# Patient Record
Sex: Male | Born: 1935 | Race: White | Hispanic: No | Marital: Married | Smoking: Former smoker
Health system: Southern US, Community
[De-identification: ages and names within clinical notes are randomized; demographics above are authoritative.]

## PROBLEM LIST (undated history)

## (undated) DIAGNOSIS — N200 Calculus of kidney: Secondary | ICD-10-CM

## (undated) DIAGNOSIS — C4359 Malignant melanoma of other part of trunk: Secondary | ICD-10-CM

## (undated) DIAGNOSIS — C649 Malignant neoplasm of unspecified kidney, except renal pelvis: Secondary | ICD-10-CM

## (undated) DIAGNOSIS — I2510 Atherosclerotic heart disease of native coronary artery without angina pectoris: Secondary | ICD-10-CM

## (undated) DIAGNOSIS — I219 Acute myocardial infarction, unspecified: Secondary | ICD-10-CM

## (undated) DIAGNOSIS — F17200 Nicotine dependence, unspecified, uncomplicated: Secondary | ICD-10-CM

## (undated) DIAGNOSIS — Z860100 Personal history of colon polyps, unspecified: Secondary | ICD-10-CM

## (undated) DIAGNOSIS — Z9861 Coronary angioplasty status: Secondary | ICD-10-CM

## (undated) DIAGNOSIS — I200 Unstable angina: Secondary | ICD-10-CM

## (undated) DIAGNOSIS — I10 Essential (primary) hypertension: Secondary | ICD-10-CM

## (undated) DIAGNOSIS — R0600 Dyspnea, unspecified: Secondary | ICD-10-CM

## (undated) DIAGNOSIS — N400 Enlarged prostate without lower urinary tract symptoms: Secondary | ICD-10-CM

## (undated) DIAGNOSIS — N529 Male erectile dysfunction, unspecified: Secondary | ICD-10-CM

## (undated) DIAGNOSIS — R7989 Other specified abnormal findings of blood chemistry: Secondary | ICD-10-CM

## (undated) DIAGNOSIS — K648 Other hemorrhoids: Secondary | ICD-10-CM

## (undated) DIAGNOSIS — R748 Abnormal levels of other serum enzymes: Secondary | ICD-10-CM

## (undated) DIAGNOSIS — I25110 Atherosclerotic heart disease of native coronary artery with unstable angina pectoris: Secondary | ICD-10-CM

## (undated) DIAGNOSIS — I252 Old myocardial infarction: Secondary | ICD-10-CM

## (undated) DIAGNOSIS — C439 Malignant melanoma of skin, unspecified: Secondary | ICD-10-CM

## (undated) DIAGNOSIS — Q619 Cystic kidney disease, unspecified: Secondary | ICD-10-CM

## (undated) DIAGNOSIS — E7800 Pure hypercholesterolemia, unspecified: Secondary | ICD-10-CM

## (undated) HISTORY — DX: Abnormal levels of other serum enzymes: R74.8

## (undated) HISTORY — PX: CORONARY ANGIOPLASTY: SHX604

## (undated) HISTORY — DX: Personal history of colon polyps, unspecified: Z86.0100

## (undated) HISTORY — DX: Other hemorrhoids: K64.8

## (undated) HISTORY — DX: Personal history of colonic polyps: Z86.010

## (undated) HISTORY — DX: Other specified abnormal findings of blood chemistry: R79.89

## (undated) HISTORY — DX: Malignant melanoma of skin, unspecified: C43.9

## (undated) HISTORY — DX: Male erectile dysfunction, unspecified: N52.9

## (undated) HISTORY — PX: CORONARY ANGIOPLASTY WITH STENT PLACEMENT: SHX49

## (undated) HISTORY — DX: Pure hypercholesterolemia, unspecified: E78.00

## (undated) HISTORY — DX: Benign prostatic hyperplasia without lower urinary tract symptoms: N40.0

## (undated) HISTORY — DX: Malignant neoplasm of unspecified kidney, except renal pelvis: C64.9

## (undated) HISTORY — DX: Cystic kidney disease, unspecified: Q61.9

## (undated) HISTORY — DX: Malignant melanoma of other part of trunk: C43.59

## (undated) HISTORY — PX: MELANOMA EXCISION: SHX5266

## (undated) HISTORY — DX: Nicotine dependence, unspecified, uncomplicated: F17.200

## (undated) HISTORY — DX: Calculus of kidney: N20.0

---

## 1974-02-04 DIAGNOSIS — I252 Old myocardial infarction: Secondary | ICD-10-CM

## 1974-02-04 HISTORY — DX: Old myocardial infarction: I25.2

## 1999-12-31 ENCOUNTER — Inpatient Hospital Stay (HOSPITAL_COMMUNITY): Admission: EM | Admit: 1999-12-31 | Discharge: 2000-01-01 | Payer: Self-pay | Admitting: Emergency Medicine

## 1999-12-31 ENCOUNTER — Encounter: Payer: Self-pay | Admitting: Cardiology

## 1999-12-31 ENCOUNTER — Encounter: Payer: Self-pay | Admitting: Emergency Medicine

## 2004-01-19 ENCOUNTER — Ambulatory Visit: Payer: Self-pay

## 2004-02-05 HISTORY — PX: NEPHRECTOMY: SHX65

## 2004-05-21 ENCOUNTER — Emergency Department: Payer: Self-pay | Admitting: Internal Medicine

## 2004-05-23 ENCOUNTER — Ambulatory Visit: Payer: Self-pay | Admitting: Urology

## 2004-06-06 ENCOUNTER — Inpatient Hospital Stay: Payer: Self-pay | Admitting: Urology

## 2005-01-03 ENCOUNTER — Ambulatory Visit: Payer: Self-pay | Admitting: Urology

## 2005-08-01 ENCOUNTER — Ambulatory Visit: Payer: Self-pay | Admitting: Urology

## 2006-01-13 ENCOUNTER — Ambulatory Visit: Payer: Self-pay | Admitting: Urology

## 2006-02-12 ENCOUNTER — Ambulatory Visit: Payer: Self-pay | Admitting: Urology

## 2007-01-19 ENCOUNTER — Ambulatory Visit: Payer: Self-pay | Admitting: Urology

## 2008-02-05 LAB — HM COLONOSCOPY

## 2008-07-28 ENCOUNTER — Ambulatory Visit: Payer: Self-pay | Admitting: Urology

## 2008-10-05 ENCOUNTER — Ambulatory Visit: Payer: Self-pay | Admitting: Gastroenterology

## 2008-10-05 HISTORY — PX: COLONOSCOPY: SHX174

## 2009-03-02 ENCOUNTER — Ambulatory Visit: Payer: Self-pay | Admitting: Family Medicine

## 2009-11-30 ENCOUNTER — Ambulatory Visit: Payer: Self-pay | Admitting: Family Medicine

## 2010-01-08 ENCOUNTER — Observation Stay (HOSPITAL_COMMUNITY)
Admission: EM | Admit: 2010-01-08 | Discharge: 2010-01-11 | Payer: Self-pay | Source: Home / Self Care | Attending: Cardiovascular Disease | Admitting: Cardiovascular Disease

## 2010-04-16 LAB — CBC
HCT: 41.1 % (ref 39.0–52.0)
HCT: 41.2 % (ref 39.0–52.0)
HCT: 44.6 % (ref 39.0–52.0)
Hemoglobin: 13.5 g/dL (ref 13.0–17.0)
Hemoglobin: 13.9 g/dL (ref 13.0–17.0)
Hemoglobin: 14 g/dL (ref 13.0–17.0)
Hemoglobin: 15.2 g/dL (ref 13.0–17.0)
MCH: 28.7 pg (ref 26.0–34.0)
MCH: 29.5 pg (ref 26.0–34.0)
MCH: 29.5 pg (ref 26.0–34.0)
MCHC: 33.6 g/dL (ref 30.0–36.0)
MCHC: 34.1 g/dL (ref 30.0–36.0)
MCV: 86.2 fL (ref 78.0–100.0)
MCV: 86.5 fL (ref 78.0–100.0)
MCV: 86.6 fL (ref 78.0–100.0)
Platelets: 203 10*3/uL (ref 150–400)
RBC: 4.71 MIL/uL (ref 4.22–5.81)
RBC: 4.75 MIL/uL (ref 4.22–5.81)
RBC: 5.15 MIL/uL (ref 4.22–5.81)
RDW: 12.4 % (ref 11.5–15.5)
RDW: 12.6 % (ref 11.5–15.5)
WBC: 5.3 10*3/uL (ref 4.0–10.5)
WBC: 6.2 10*3/uL (ref 4.0–10.5)
WBC: 7.1 10*3/uL (ref 4.0–10.5)

## 2010-04-16 LAB — URINALYSIS, ROUTINE W REFLEX MICROSCOPIC
Bilirubin Urine: NEGATIVE
Ketones, ur: NEGATIVE mg/dL
Nitrite: NEGATIVE
Protein, ur: NEGATIVE mg/dL
Urobilinogen, UA: 0.2 mg/dL (ref 0.0–1.0)

## 2010-04-16 LAB — COMPREHENSIVE METABOLIC PANEL
ALT: 29 U/L (ref 0–53)
AST: 29 U/L (ref 0–37)
CO2: 27 mEq/L (ref 19–32)
Chloride: 107 mEq/L (ref 96–112)
Creatinine, Ser: 1.29 mg/dL (ref 0.4–1.5)
GFR calc Af Amer: >60 mL/min (ref >60)
GFR calc non Af Amer: 54 mL/min — ABNORMAL LOW (ref >60)
Total Bilirubin: 0.9 mg/dL (ref 0.3–1.2)

## 2010-04-16 LAB — CK TOTAL AND CKMB (NOT AT ARMC)
CK, MB: 1.3 ng/mL (ref 0.3–4.0)
Relative Index: 0.6 (ref 0.0–2.5)
Total CK: 222 U/L (ref 7–232)

## 2010-04-16 LAB — DIFFERENTIAL
Basophils Absolute: 0 10*3/uL (ref 0.0–0.1)
Basophils Absolute: 0 10*3/uL (ref 0.0–0.1)
Basophils Relative: 0 % (ref 0–1)
Basophils Relative: 0 % (ref 0–1)
Eosinophils Absolute: 0.3 10*3/uL (ref 0.0–0.7)
Eosinophils Relative: 5 % (ref 0–5)
Lymphocytes Relative: 26 % (ref 12–46)
Lymphocytes Relative: 34 % (ref 12–46)
Lymphs Abs: 1.6 10*3/uL (ref 0.7–4.0)
Monocytes Absolute: 0.9 10*3/uL (ref 0.1–1.0)
Monocytes Relative: 15 % — ABNORMAL HIGH (ref 3–12)
Neutro Abs: 3.3 10*3/uL (ref 1.7–7.7)
Neutro Abs: 3.3 10*3/uL (ref 1.7–7.7)
Neutrophils Relative %: 47 % (ref 43–77)
Neutrophils Relative %: 53 % (ref 43–77)

## 2010-04-16 LAB — BASIC METABOLIC PANEL
BUN: 16 mg/dL (ref 6–23)
CO2: 25 mEq/L (ref 19–32)
CO2: 26 mEq/L (ref 19–32)
Calcium: 8.8 mg/dL (ref 8.4–10.5)
Chloride: 104 mEq/L (ref 96–112)
Chloride: 110 mEq/L (ref 96–112)
Creatinine, Ser: 1.29 mg/dL (ref 0.4–1.5)
GFR calc Af Amer: >60 mL/min (ref >60)
GFR calc Af Amer: >60 mL/min (ref >60)
GFR calc non Af Amer: 54 mL/min — ABNORMAL LOW (ref >60)
Glucose, Bld: 85 mg/dL (ref 70–99)
Potassium: 3.7 mEq/L (ref 3.5–5.1)
Potassium: 3.9 mEq/L (ref 3.5–5.1)
Sodium: 138 mEq/L (ref 135–145)
Sodium: 141 mEq/L (ref 135–145)

## 2010-04-16 LAB — CARDIAC PANEL(CRET KIN+CKTOT+MB+TROPI)
CK, MB: 1.2 ng/mL (ref 0.3–4.0)
Relative Index: 0.6 (ref 0.0–2.5)

## 2010-04-16 LAB — PROTIME-INR
INR: 1.06 (ref 0.00–1.49)
Prothrombin Time: 14 seconds (ref 11.6–15.2)

## 2010-04-16 LAB — APTT: aPTT: 145 seconds — ABNORMAL HIGH (ref 24–37)

## 2010-04-16 LAB — POCT CARDIAC MARKERS
Myoglobin, poc: 90 ng/mL (ref 12–200)
Troponin i, poc: <0.05 ng/mL (ref 0.00–0.09)

## 2010-04-16 LAB — HEMOGLOBIN A1C: Mean Plasma Glucose: 120 mg/dL — ABNORMAL HIGH (ref <117)

## 2010-04-16 LAB — TROPONIN I: Troponin I: 0.02 ng/mL (ref 0.00–0.06)

## 2010-04-16 LAB — MRSA PCR SCREENING: MRSA by PCR: NEGATIVE

## 2010-06-05 DIAGNOSIS — C439 Malignant melanoma of skin, unspecified: Secondary | ICD-10-CM

## 2010-06-05 HISTORY — DX: Malignant melanoma of skin, unspecified: C43.9

## 2010-06-14 ENCOUNTER — Ambulatory Visit: Payer: Self-pay | Admitting: Dermatology

## 2010-06-22 NOTE — Cardiovascular Report (Signed)
[place]  Patient:    Nicholas Douglas, Nicholas Douglas                         MRN: [id number] Proc. Date: 12/31/99 Adm. Date:  [id number] Attending:  Ophelia Shoulder CC:         Richard A. Alanda Amass, M.D.   Cardiac Catheterization  PROCEDURES: 1. Left heart catheterization. 2. Coronary angiography. 3. Left ventriculogram. 4. Bilateral renal angiogram.  COMPLICATIONS:  None.  INDICATIONS:  Mr. Cisse is a 77 year old white male, patient of Dr. Alanda Amass with a history of hypertension, CAD status post PTCA in 1997 and hyperlipidemia.  The patient presented to the ER on December 31, 1999, complaining of left arm pain which migrated into his posterior neck.  He had no ECG changes on his last catheterization in 1997 with known residual disease of 70%.  The patient is now referred for cardiac catheterization to redefine his coronary status.  DESCRIPTION OF PROCEDURE:  After given informed written consent, the patient was brought to the cardiac catheterization lab where his right and left groins were shaved, prepped, and draped in the usual sterile fashion.  ECG monitoring was established.  Using modified Seldinger technique, a #6 French arterial sheath was inserted in the right femoral artery.  A 6 French diagnostic catheter was then used to perform diagnostic angiography.  This reveals a large left main with no significant disease.  The LAD is a medium sized vessel which coursed to the apex and gave rise to two small diagonal branches.  The LAD is noted to be diffusely diseased and calcified throughout its proximal and midportion.  There is up to 50% stenotic lesions throughout the proximal, mid and distal LAD.  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  There are two small diagonal branches which are subtotally occluded.  Left circumflex is a medium sized vessel which coursed in the AV groove and gave rise to two obtuse marginal branches.  The  AV groove circumflex has a long 70% stenotic lesion just past the takeoff of the first OM.  The first OM is a medium sized vessel with a 50% ostial lesion.  The second OM is a medium sized vessel with no significant disease.  The right coronary artery is a medium sized vessel which is dominant and gives rise to both the PDA as well as the posterolateral branch.  The RCA is calcified in its proximal and midportion with 60% proximal, 70% mid and 30% distal lesion.  The PDA and posterolateral branch are small vessels which are irregular but have no high-grade lesion.  LEFT VENTRICULOGRAM:  The left ventriculogram reveals preserved EF at 50%. There is mild anterolateral hypokinesis.  Selected renal angiogram revealed no evidence of renal artery stenosis.  HEMODYNAMICS:  Systemic arterial pressure 160/84, LV systemic pressure 160/14, LVEDP of 20.  IMPRESSION: 1. Significant three-vessel coronary artery disease which appears    essentially unchanged from the catheterization in 1997. 2. Normal left ventricular systolic function with mild wall motion    abnormality noted above. 3. No evidence of renal artery stenosis. 4. Systemic hypertension.  DISCUSSION:  Mr. Garber does have three-vessel coronary artery disease, however, this is essentially unchanged from 1997.  He did have a stress Cardiolite in May of this year revealing no evidence of ischemia.  We will plan to continue his current medical regimen and consider repeat stress Cardiolite as an outpatient to rule out ischemia in  the RCA or LAD or circumflex territory. Aggressive hypertension is warranted. DD:  12/31/99 TD:  12/31/99 Job: [id number]

## 2010-06-22 NOTE — Discharge Summary (Signed)
[place]  Patient:    Nicholas Douglas, Nicholas Douglas                         MRN: [id number] Adm. Date:  [id number] Disc. Date: [id number] Attending:  Ophelia Shoulder Dictator:   Marya Fossa, P.A. CC:         Lenise Herald, M.D.  Marya Amsler. Dareen Piano, M.D.   Discharge Summary  ADMITTING PHYSICIAN:  Madaline Savage, M.D.  DISCHARGING PHYSICIAN:  Lenise Herald, M.D.  ADMISSION DIAGNOSES: 1. Chest pain, rule out myocardial infarction. 2. Hypertension. 3. Hyperlipidemia. 4. Known coronary artery disease.  DISCHARGE DIAGNOSES: 1. Chest pain, resolved.  Myocardial infarction ruled out with negative    enzymes, noncardiac. 2. Coronary artery disease, stable. 3. Hypertension. 4. Hyperlipidemia.  HISTORY OF PRESENT ILLNESS:  This is a 77 year old white male patient of Dr. Mancel Parsons with known CAD.  For the last few weeks he has had "gas in his stomach and chest cavity."  Felt like pressure; waxed and waned.  Took Prevacid and Mylanta without relief.  Positive fluctuance.  Around 9 p.m. last night while watching television developed ______ to the left arm throbbing medially starting at the LO radiating to the shoulder.  Lasted all evening; varied severity, 4/10 at worst.  Patient felt short of breath, but no nausea, vomiting, or diaphoresis.  He took one sublingual nitroglycerin without relief.  He therefore came to the emergency room around midnight.  He was given four baby aspirin, sublingual nitroglycerin, and then IV nitroglycerin without relief.  He says his pain is slowly ebbing away now.  No recent strenuous activity.  Of note, he had the flu shot two and a half weeks ago and has not felt well since.  He has had three angioplasties in the past and an MI and has only had chest tightness symptoms as his antral equivalent.  Patient will be admitted for atypical chest pain.  Will check cardiac enzymes. EKG has been negative.  His last  catheterization was in 1997 and had residual disease per patient.  Had a Cardiolite this spring reported normal to patient. Will obtain records.  Has no other cardiac risk factors of ______ heart catheter definitive diagnosis.  PROCEDURE:  Cardiac catheterization December 31, 1999 by Dr. Lenise Herald.  COMPLICATIONS:  None.  CONSULTATIONS:  None.  HOSPITAL COURSE:  Mr. Marano was admitted to [facility] on December 31, 1999 for atypical chest pain.  EKG showed normal sinus rhythm with nonspecific ST-T wave abnormality.  No acute changes noted.  Cardiac enzymes were negative.  Total cholesterol 123, triglyceride 106, HDL 33, LDL 69.  BUN 17, creatinine 1.0.  CBC within normal limits.  Patient was taken to the cardiac catheterization laboratory on December 31, 1999 by Dr. Jenne Campus.  This revealed normal left main, diffuse 30-50% lesions of the LAD, 99% ostial small diagonal 1, 70% mid circumflex, 50% ostial OM1, diffuse 60-78% lesions in the RCA.  EF 50%.  Dr. Jenne Campus found no culprit lesions and felt that his medical disease was stable per his last catheterization report and this study.  He recommends repeat stress Cardiolite as an outpatient to make sure that some of the 70% lesions are not causing him trouble.  Patient remained hemodynamically stable and right groin remained stable post procedure.  Patient was discharged home on January 01, 2000.  DISCHARGE MEDICATIONS:  1. Nexium 40 mg a day for a month, then p.r.n.  2.  Norvasc 5 mg.  3. Lipitor 10 mg a day.  4. Aspirin 325 mg a day.  5. Altace 5 mg a day.  6. Imdur 30 mg a day.  7. Atenolol 25 mg a day.  8. Folic acid, B12, B6.  9. Nitroglycerin as needed for chest pain. 10. Vioxx as needed.  ACTIVITY:  No strenuous activity, lifting more than 5 pounds or driving for the next two days.  DIET:  Low fat, low cholesterol, low salt.  INSTRUCTIONS:  ______ for a week.  He is asked to call the office with any  problems or questions.  Follow-up appointment is scheduled with Dr. Jenne Campus December 21 at 3:15.  He is to have an exercise rest stress test Wednesday, December 5 at 8:30.  He will see Dr. Alanda Amass March 25 at 3:30. DD:  01/17/00 TD:  01/18/00 Job: [id number]

## 2010-09-04 ENCOUNTER — Ambulatory Visit: Payer: Self-pay | Admitting: Urology

## 2010-09-14 ENCOUNTER — Ambulatory Visit: Payer: Self-pay | Admitting: Urology

## 2011-03-08 HISTORY — PX: OTHER SURGICAL HISTORY: SHX169

## 2011-03-19 ENCOUNTER — Ambulatory Visit: Payer: Self-pay | Admitting: Urology

## 2011-03-28 ENCOUNTER — Ambulatory Visit: Payer: Self-pay | Admitting: Family Medicine

## 2011-05-06 HISTORY — PX: OTHER SURGICAL HISTORY: SHX169

## 2011-05-08 ENCOUNTER — Emergency Department (HOSPITAL_COMMUNITY)
Admission: EM | Admit: 2011-05-08 | Discharge: 2011-05-08 | Disposition: A | Discharge status: Home / Self Care | Payer: Medicare Other | Attending: Emergency Medicine | Admitting: Emergency Medicine

## 2011-05-08 ENCOUNTER — Emergency Department (HOSPITAL_COMMUNITY): Payer: Medicare Other

## 2011-05-08 ENCOUNTER — Other Ambulatory Visit: Payer: Self-pay

## 2011-05-08 ENCOUNTER — Encounter (HOSPITAL_COMMUNITY): Payer: Self-pay

## 2011-05-08 DIAGNOSIS — Z9889 Other specified postprocedural states: Secondary | ICD-10-CM | POA: Insufficient documentation

## 2011-05-08 DIAGNOSIS — R0602 Shortness of breath: Secondary | ICD-10-CM | POA: Insufficient documentation

## 2011-05-08 DIAGNOSIS — Z79899 Other long term (current) drug therapy: Secondary | ICD-10-CM | POA: Insufficient documentation

## 2011-05-08 DIAGNOSIS — M542 Cervicalgia: Secondary | ICD-10-CM | POA: Insufficient documentation

## 2011-05-08 DIAGNOSIS — I10 Essential (primary) hypertension: Secondary | ICD-10-CM | POA: Insufficient documentation

## 2011-05-08 DIAGNOSIS — R42 Dizziness and giddiness: Secondary | ICD-10-CM | POA: Insufficient documentation

## 2011-05-08 DIAGNOSIS — I252 Old myocardial infarction: Secondary | ICD-10-CM | POA: Insufficient documentation

## 2011-05-08 DIAGNOSIS — M545 Low back pain, unspecified: Secondary | ICD-10-CM | POA: Insufficient documentation

## 2011-05-08 DIAGNOSIS — M549 Dorsalgia, unspecified: Secondary | ICD-10-CM

## 2011-05-08 HISTORY — DX: Acute myocardial infarction, unspecified: I21.9

## 2011-05-08 HISTORY — DX: Essential (primary) hypertension: I10

## 2011-05-08 LAB — CBC
HCT: 44.6 % (ref 39.0–52.0)
Hemoglobin: 15.4 g/dL (ref 13.0–17.0)
RDW: 13.1 % (ref 11.5–15.5)
WBC: 6.5 10*3/uL (ref 4.0–10.5)

## 2011-05-08 LAB — BASIC METABOLIC PANEL
BUN: 22 mg/dL (ref 6–23)
Chloride: 104 mEq/L (ref 96–112)
GFR calc Af Amer: 75 mL/min — ABNORMAL LOW (ref >90)
Potassium: 4.4 mEq/L (ref 3.5–5.1)

## 2011-05-08 LAB — DIFFERENTIAL
Basophils Absolute: 0 10*3/uL (ref 0.0–0.1)
Lymphocytes Relative: 28 % (ref 12–46)
Monocytes Absolute: 1 10*3/uL (ref 0.1–1.0)
Monocytes Relative: 15 % — ABNORMAL HIGH (ref 3–12)
Neutro Abs: 3.4 10*3/uL (ref 1.7–7.7)

## 2011-05-08 NOTE — ED Notes (Signed)
Pt states dizziness since last year in dec has seen a dr and dx w/ htn got that under control w/ meds but dizziness con't states his back hurts and that is what happens when he has MI has had carotid  Studies  And kidney studies but they are all ok pt is still dizzy

## 2011-05-08 NOTE — ED Notes (Signed)
Pt ambulated well without any difficulty.

## 2011-05-08 NOTE — Discharge Instructions (Signed)
Dizziness Dizziness is a common problem. It is a feeling of unsteadiness or lightheadedness. You may feel like you are about to faint. Dizziness can lead to injury if you stumble or fall. A person of any age group can suffer from dizziness, but dizziness is more common in older adults. CAUSES  Dizziness can be caused by many different things, including:  Middle ear problems.   Standing for too long.   Infections.   An allergic reaction.   Aging.   An emotional response to something, such as the sight of blood.   Side effects of medicines.   Fatigue.   Problems with circulation or blood pressure.   Excess use of alcohol, medicines, or illegal drug use.   Breathing too fast (hyperventilation).   An arrhythmia or problems with your heart rhythm.   Low red blood cell count (anemia).   Pregnancy.   Vomiting, diarrhea, fever, or other illnesses that cause dehydration.   Diseases or conditions such as Parkinson's disease, high blood pressure (hypertension), diabetes, and thyroid problems.   Exposure to extreme heat.  DIAGNOSIS  To find the cause of your dizziness, your caregiver may do a physical exam, lab tests, radiologic imaging scans, or an electrocardiography test (ECG).  TREATMENT  Treatment of dizziness depends on the cause of your symptoms and can vary greatly. HOME CARE INSTRUCTIONS   Drink enough fluids to keep your urine clear or pale yellow. This is especially important in very hot weather. In the elderly, it is also important in cold weather.   If your dizziness is caused by medicines, take them exactly as directed. When taking blood pressure medicines, it is especially important to get up slowly.   Rise slowly from chairs and steady yourself until you feel okay.   In the morning, first sit up on the side of the bed. When this seems okay, stand slowly while holding onto something until you know your balance is fine.   If you need to stand in one place for a  long time, be sure to move your legs often. Tighten and relax the muscles in your legs while standing.   If dizziness continues to be a problem, have someone stay with you for a day or two. Do this until you feel you are well enough to stay alone. Have the person call your caregiver if he or she notices changes in you that are concerning.   Do not drive or use heavy machinery if you feel dizzy.  SEEK IMMEDIATE MEDICAL CARE IF:   Your dizziness or lightheadedness gets worse.   You feel nauseous or vomit.   You develop problems with talking, walking, weakness, or using your arms, hands, or legs.   You are not thinking clearly or you have difficulty forming sentences. It may take a friend or family member to determine if your thinking is normal.   You develop chest pain, abdominal pain, shortness of breath, or sweating.   Your vision changes.   You notice any bleeding.   You have side effects from medicine that seems to be getting worse rather than better.  MAKE SURE YOU:   Understand these instructions.   Will watch your condition.   Will get help right away if you are not doing well or get worse.  Document Released: 07/17/2000 Document Revised: 01/10/2011 Document Reviewed: 08/10/2010 [facility] Patient Information 2012 [place], [place].  Shortness of Breath Shortness of breath (dyspnea) is the feeling of uneasy breathing. Shortness of breath does not always  mean that there is a life-threatening illness. However, shortness of breath requires immediate medical care. CAUSES  Causes for shortness of breath include:  Not enough oxygen in the air (as with high altitudes or with a smoke-filled room).   Short-term (acute) lung disease, including:   Infections such as pneumonia.   Fluid in the lungs, such as heart failure.   A blood clot in the lungs (pulmonary embolism).   Lasting (chronic) lung diseases.   Heart disease (heart attack, angina, heart failure, and others).   Low  red blood cells (anemia).   Poor physical fitness. This can cause shortness of breath when you exercise.   Chest or back injuries or stiffness.   Being overweight (obese).   Anxiety. This can make you feel like you are not getting enough air.  DIAGNOSIS  Serious medical problems can usually be found during your physical exam. Many tests may also be done to determine why you are having shortness of breath. Tests include:  Chest X-rays.   Lung function tests.   Blood tests.   Electrocardiography.   Exercise testing.   A cardiac echo.   Imaging scans.  Your caregiver may not be able to find a cause for your shortness of breath after your exam. In this case, it is important to have a follow-up exam with your caregiver as directed.  HOME CARE INSTRUCTIONS   Do not smoke. Smoking is a common cause of shortness of breath. Ask for help to stop smoking.   Avoid being around chemicals that may bother your breathing (paint fumes, dust).   Rest as needed. Slowly resume your usual activities.   If medicines were prescribed, take them as directed for the full length of time directed. This includes oxygen and any inhaled medicines.   Follow up with your caregiver as directed. Waiting to do so or failure to follow up could result in worsening of your condition and possible disability or death.   Be sure you understand what to do or who to call if your shortness of breath worsens.  SEEK MEDICAL CARE IF:   Your condition does not improve in the time expected.   You have a hard time doing your normal activities even with rest.   You have any side effects or problems with the medicines prescribed.   You develop any new symptoms.  SEEK IMMEDIATE MEDICAL CARE IF:   Your shortness of breath is getting worse.   You feel lightheaded, faint, or develop a cough not controlled with medicines.   You start coughing up blood.   You have pain with breathing.   You have chest pain or pain in  your arms, shoulders, or abdomen.   You have a fever.   You are unable to walk up stairs or exercise the way you normally do.   Your symptoms are getting worse.  Document Released: 10/16/2000 Document Revised: 01/10/2011 Document Reviewed: 06/03/2007 [facility] Patient Information 2012 [place], [place].  RESOURCE GUIDE  Dental Problems  Patients with Medicaid: [facility] [phone] [address]                                           [phone] [address] Phone:  [phone]  Phone:  [phone]  If unable to pay or uninsured, contact:  [facility] or [facility]. to become qualified for the adult dental clinic.  Chronic Pain Problems Contact [facility]  [phone] Patients need to be referred by their primary care doctor.  Insufficient Money for Medicine Contact [employer] Way:  call "211" or [facility] [phone].  No Primary Care Doctor Call [facility]  ([phone] Other agencies that provide inexpensive medical care    [facility]  [phone]    [facility]  [phone]    [facility]  [phone]    [facility]  [phone]    [facility]  [phone]    [facility]  [phone]  [facility]ological Services [facility]  [phone] [facility]  [phone] [facility]   [phone] (emergency services [phone])  Abuse/Neglect [facility] Child Abuse Hotline (385) 182-7012 [facility] Child Abuse Hotline 838-572-5175 (After Hours)  Emergency Shelter [facility] 443-190-5613  [facility] [phone] [phone] [facility] (613)[phone]  MRSA Hotline #:   (709)182-1882    [facility] Resources  [facility]  [employer]                           [facility]  [address]. [address] [place]                     [address]         [address]  Blondell Reveal Phone:  [phone]                                  Phone:  [phone]                   Phone:  [phone]  [facility] Phone:  [phone]  [facility] Child Abuse Hotline [phone] (After Hours)

## 2011-05-08 NOTE — ED Notes (Signed)
Patient presents with dizziness, neck pain, mid back pain x 1 month with worsening pain since yesterday. Patient denies chest pain, but reports shortness of breath.  Patient has seen several doctors for same symptoms and was told by Dr. [name] to come to ED.

## 2011-05-08 NOTE — ED Provider Notes (Deleted)
BP 131/64  Pulse 55  Temp(Src) 97.9 F (36.6 C) (Oral)  Resp 18  SpO2 96%   Medical screening exam performed by me. Pt with several complaints incl lightheadedness x months, neck pain, back pain. PMD with outpatient carotid U/S 3 weeks negative per patient. Was going to be scheduled as outpatient for CT head "but I feel so crummy now, I had to come in".  RRR. Bibasilar crackles. Neuro unremarkable. EKG, CT head, CXR, screening labs ordered. Move to main ED for further w/u and evaluation.  Forbes Cellar, MD 05/08/11 1356

## 2011-05-08 NOTE — ED Provider Notes (Signed)
History     CSN: [id number]  Arrival date & time 05/08/11  1229   First MD Initiated Contact with Patient 05/08/11 1325      Chief Complaint  Patient presents with  . Dizziness  . Neck Pain    (Consider location/radiation/quality/duration/timing/severity/associated sxs/prior treatment) HPI   Medical screening exam performed by me. Pt with several complaints incl lightheadedness x months, neck pain in c spine area, back pain in lower lumbar area. PMD with outpatient carotid U/S 3 weeks negative per patient. Was going to be scheduled as outpatient for CT head "but I feel so crummy now, I had to come in". he complains of chronic shortness of breath for the past few weeks to months. He states this is worsening but that he is still able to ambulate. No orthopnea, PND, or leg swelling. He denies chest pain. He states that he is concerned because the last time he had a myocardial infarction he did not have chest pain but rather neck pain and back pain. Denies numbness, tingling, weakness of his extremities. His neck pain, back pain are not worse with movement. There has been no fall, trauma, injury. Remote history of renal carcinoma status post nephrectomy in remission.  ED Notes, ED Provider Notes from 05/08/11 0000 to 05/08/11 12:54:43       Cristal Generous, RN 05/08/2011 12:42      Patient presents with dizziness, neck pain, mid back pain x 1 month with worsening pain since yesterday. Patient denies chest pain, but reports shortness of breath. Patient has seen several doctors for same symptoms and was told by Dr. [name] to come to ED.     Past Medical History  Diagnosis Date  . Myocardial infarction   . Hypertension     Past Surgical History  Procedure Date  . Kidney surgery   . Cardiac catheterization   . Angioplasty     No family history on file.  History  Substance Use Topics  . Smoking status: Former Games developer  . Smokeless tobacco: Not on file  . Alcohol Use: Yes       Review of Systems  All other systems reviewed and are negative.   except as noted HPI   Allergies  Morphine and related  Home Medications   Current Outpatient Rx  Name Route Sig Dispense Refill  . AMLODIPINE BESYLATE 10 MG PO TABS Oral Take 10 mg by mouth daily.    . ASPIRIN 325 MG PO TABS Oral Take 325 mg by mouth daily.    . ATENOLOL 25 MG PO TABS Oral Take 25 mg by mouth daily.    Marland Kitchen EZETIMIBE 10 MG PO TABS Oral Take 10 mg by mouth daily.    . IBUPROFEN 200 MG PO TABS Oral Take 200 mg by mouth every 6 (six) hours as needed.    . ISOSORBIDE MONONITRATE ER 60 MG PO TB24 Oral Take 60 mg by mouth daily.    Marland Kitchen NITROGLYCERIN 0.4 MG SL SUBL Sublingual Place 0.4 mg under the tongue every 5 (five) minutes as needed.    . OMEGA-3-ACID ETHYL ESTERS 1 G PO CAPS Oral Take 1 g by mouth 2 (two) times daily.    Marland Kitchen VALSARTAN 160 MG PO TABS Oral Take 320 mg by mouth daily.      BP 131/64  Pulse 55  Temp(Src) 97.9 F (36.6 C) (Oral)  Resp 18  SpO2 96%  Physical Exam  Nursing note and vitals reviewed. Constitutional: He is oriented to person, place,  and time. He appears well-developed and well-nourished. No distress.  HENT:  Head: Atraumatic.  Mouth/Throat: Oropharynx is clear and moist.       No carotid bruit  Eyes: Conjunctivae are normal. Pupils are equal, round, and reactive to light.  Neck: Neck supple. No JVD present.  Cardiovascular: Normal rate, regular rhythm, normal heart sounds and intact distal pulses.  Exam reveals no gallop and no friction rub.   No murmur heard. Pulmonary/Chest: Effort normal. No respiratory distress. He has no wheezes. He has no rales.  Abdominal: Soft. Bowel sounds are normal. There is no tenderness. There is no rebound and no guarding.  Musculoskeletal: Normal range of motion. He exhibits no edema and no tenderness.       No midline c/t/l/s ttp   Neurological: He is alert and oriented to person, place, and time. No cranial nerve deficit. He  exhibits normal muscle tone. Coordination normal.       Strength 5/5 all extremities No pronator drift No facial droop   Skin: Skin is warm and dry.  Psychiatric: He has a normal mood and affect.    Date: 05/08/2011  Rate: 52  Rhythm: sinus bradycardia  QRS Axis: normal  Intervals: normal  ST/T Wave abnormalities: normal  Conduction Disutrbances:none  Narrative Interpretation:   Old EKG Reviewed: unchanged   ED Course  Procedures (including critical care time)  Labs Reviewed  DIFFERENTIAL - Abnormal; Notable for the following:    Monocytes Relative 15 (*)    All other components within normal limits  BASIC METABOLIC PANEL - Abnormal; Notable for the following:    GFR calc non Af Amer 64 (*)    GFR calc Af Amer 75 (*)    All other components within normal limits  CBC  TROPONIN I   Dg Chest 2 View  05/08/2011  *RADIOLOGY REPORT*  Clinical Data: Dizziness, shortness of breath  CHEST - 2 VIEW  Comparison: Chest x-ray report 12/31/1999 no images available.  Findings: There is poor inspiration.  Cardiomediastinal silhouette is unremarkable.  No acute infiltrate or pleural effusion.  No pulmonary edema. Bony thorax is unremarkable.  IMPRESSION: No active disease.  Original Report Authenticated By: Natasha Mead, M.D.   Ct Head Wo Contrast  05/08/2011  *RADIOLOGY REPORT*  Clinical Data: Dizziness, neck pain  CT HEAD WITHOUT CONTRAST  Technique:  Contiguous axial images were obtained from the base of the skull through the vertex without contrast.  Comparison: None.  Findings: No evidence of parenchymal hemorrhage or extra-axial fluid collection. No mass lesion, mass effect, or midline shift.  No CT evidence of acute infarction.  Intracranial atherosclerosis.  Cerebral volume is age appropriate.  No ventriculomegaly.  The visualized paranasal sinuses are essentially clear. The mastoid air cells are unopacified.  No evidence of calvarial fracture.  IMPRESSION: No evidence of acute intracranial  abnormality.  Original Report Authenticated By: Charline Bills, M.D.     1. Lightheadedness   2. Shortness of breath   3. Neck pain   4. Back pain    MDM  he presents with multiple complaints that he is currently being worked up for as an outpatient. This includes shortness of breath which I do not suspect be secondary to an acute cause. Specifically he does not appear fluid overloaded and is not complaining of findings consistent with acute heart failure. I do not suspect pulmonary embolism or dissection for his chronic problems. He also complains of lightheadedness since December he states this is constant since then. There  is no acute cause of his CT head which she was going to have done as an outpatient. I do not suspect acute stroke. He is not currently experiencing back pain in the emergency department. He is 5 out of 5 strength in all extremities. He is ambulatory and asx in the emergency department.  Did place a courtesy call to [facility] vascular. I spoke with Dr. [name]. The patient has an outpatient appointment with him in several days on 4/11.         Forbes Cellar, MD 05/08/11 [phone]

## 2011-11-26 ENCOUNTER — Encounter: Payer: Self-pay | Admitting: *Deleted

## 2011-12-03 ENCOUNTER — Encounter: Payer: Self-pay | Admitting: Family Medicine

## 2011-12-03 ENCOUNTER — Ambulatory Visit (INDEPENDENT_AMBULATORY_CARE_PROVIDER_SITE_OTHER): Payer: Medicare Other | Admitting: Family Medicine

## 2011-12-03 VITALS — BP 128/72 | HR 52 | Temp 97.9°F | Resp 16 | Ht 67.5 in | Wt 189.0 lb

## 2011-12-03 DIAGNOSIS — E785 Hyperlipidemia, unspecified: Secondary | ICD-10-CM | POA: Insufficient documentation

## 2011-12-03 DIAGNOSIS — D0360 Melanoma in situ of unspecified upper limb, including shoulder: Secondary | ICD-10-CM

## 2011-12-03 DIAGNOSIS — C649 Malignant neoplasm of unspecified kidney, except renal pelvis: Secondary | ICD-10-CM

## 2011-12-03 DIAGNOSIS — I2510 Atherosclerotic heart disease of native coronary artery without angina pectoris: Secondary | ICD-10-CM

## 2011-12-03 DIAGNOSIS — Z23 Encounter for immunization: Secondary | ICD-10-CM | POA: Insufficient documentation

## 2011-12-03 DIAGNOSIS — I10 Essential (primary) hypertension: Secondary | ICD-10-CM | POA: Insufficient documentation

## 2011-12-03 DIAGNOSIS — R42 Dizziness and giddiness: Secondary | ICD-10-CM | POA: Insufficient documentation

## 2011-12-03 DIAGNOSIS — I25110 Atherosclerotic heart disease of native coronary artery with unstable angina pectoris: Secondary | ICD-10-CM

## 2011-12-03 DIAGNOSIS — I25119 Atherosclerotic heart disease of native coronary artery with unspecified angina pectoris: Secondary | ICD-10-CM | POA: Insufficient documentation

## 2011-12-03 DIAGNOSIS — K635 Polyp of colon: Secondary | ICD-10-CM

## 2011-12-03 DIAGNOSIS — D126 Benign neoplasm of colon, unspecified: Secondary | ICD-10-CM

## 2011-12-03 DIAGNOSIS — Z0000 Encounter for general adult medical examination without abnormal findings: Secondary | ICD-10-CM | POA: Insufficient documentation

## 2011-12-03 HISTORY — DX: Atherosclerotic heart disease of native coronary artery with unstable angina pectoris: I25.110

## 2011-12-03 NOTE — Assessment & Plan Note (Addendum)
Anticipatory guidance provided.  Immunizations UTD: s/p influenza vaccine in office.  Independent with all ADLs.  No living will but desires DNR/DNI; pt's wife present for visit and expressed understanding of wishes.  Due for repeat colonoscopy and to contact [name] for appointment.  Pt declined PSA today and urologist/[facility] also did not recommend PSA due to age.   No evidence of depression.  Low fall risk.  No hearing loss identified.  No alcohol use.

## 2011-12-03 NOTE — Assessment & Plan Note (Signed)
Controlled no change in management. 

## 2011-12-03 NOTE — Assessment & Plan Note (Signed)
Controlled; followed by [name] every six months.  S/p recent cardiolite low risk.

## 2011-12-03 NOTE — Assessment & Plan Note (Signed)
Administered  

## 2011-12-03 NOTE — Assessment & Plan Note (Signed)
Resolved since last visit

## 2011-12-03 NOTE — Assessment & Plan Note (Signed)
Controlled; tolerating addition of Niaspan to replace Crestor.  Managed by cardiology.

## 2011-12-03 NOTE — Assessment & Plan Note (Signed)
Stable; followed every four months by [facility].

## 2011-12-03 NOTE — Assessment & Plan Note (Signed)
Stable; to contact [name] for repeat colonoscopy.

## 2011-12-03 NOTE — Progress Notes (Signed)
[address]   [place], [place]  [address]   [phone]  Subjective:    Patient ID: Nicholas Douglas, male    DOB: 10-06-1931, 79 y.o.   MRN: [id number]  HPIThis 78 y.o. male presents to establish care and for CPE.   Last CPE 11/30/09. TDAP 10/04/10 Pneumovax 2006 Zostavax 11/30/09 Influenza vaccine 2012, 2013. Colonoscopy 10/2008 Nicholas Douglas; repeat in 3 years.  Dr. Marlan Palau office has contacted pt regarding scheduling repeat colonoscopy. Eye exam 10/2011.  +glasses.  No glaucoma; early cataracts.   [facility]. Dental exam.  10/2011.  Medical Events since last visit: 1.  Cardiology follow-up 11/22/11  Nicholas Douglas.  No change in medications; did decreased Isosorbide to 30mg  daily.  Added Niaspan in 05/2011. 2.  Urology follow-up  08/15/11; follow-up in one year.  No changes to management.  PSA normal per notes.  Performed prostate exam but no PSA; [facility] did not recommend PSA at pt's age.  Nicholas Douglas. 3.  Dermatology follow-up; followed by Dr. Orson Aloe every four months. 4.  Ophthalmology exam. Nicholas Douglas. 5. Dental exam.   Last visit: 1.  Dizziness follow-up 05/2011.  Persistent; with elevated blood pressure.  S/p carotid dopplers minimal plaque formation.  Now resolved spontaneously; blood pressure has also normalized.   Review of Systems  Constitutional: Negative for fever, chills, diaphoresis, activity change, appetite change, fatigue and unexpected weight change.  HENT: Negative for hearing loss, ear pain, nosebleeds, congestion, sore throat, facial swelling, rhinorrhea, sneezing, drooling, mouth sores, trouble swallowing, neck pain, neck stiffness, dental problem, voice change, postnasal drip, sinus pressure, tinnitus and ear discharge.   Eyes: Negative for photophobia, pain, discharge, redness, itching and visual disturbance.  Respiratory: Negative for apnea, cough, choking, chest tightness, shortness of breath, wheezing and stridor.   Cardiovascular: Negative for chest pain, palpitations and leg  swelling.  Gastrointestinal: Negative for nausea, vomiting, abdominal pain, diarrhea, constipation, blood in stool, abdominal distention, anal bleeding and rectal pain.  Genitourinary: Positive for decreased urine volume. Negative for dysuria, urgency, frequency, hematuria, flank pain, discharge, penile swelling, scrotal swelling, enuresis, difficulty urinating, genital sores, penile pain and testicular pain.  Musculoskeletal: Negative for myalgias, back pain, joint swelling, arthralgias and gait problem.  Skin: Negative for color change, pallor, rash and wound.  Neurological: Negative for dizziness, tremors, seizures, syncope, facial asymmetry, speech difficulty, weakness, light-headedness, numbness and headaches.  Hematological: Negative for adenopathy. Does not bruise/bleed easily.  Psychiatric/Behavioral: Negative for suicidal ideas, hallucinations, behavioral problems, confusion, disturbed wake/sleep cycle, self-injury, dysphoric mood, decreased concentration and agitation. The patient is not nervous/anxious and is not hyperactive.         Past Medical History  Diagnosis Date  . Myocardial infarction   . Hypertension   . Personal history of colonic polyps   . Unspecified congenital cystic kidney disease   . Other abnormal blood chemistry   . Other nonspecific abnormal serum enzyme levels   . Malignant neoplasm of kidney, except pelvis   . Calculus of kidney   . Coronary atherosclerosis of unspecified type of vessel, native or graft   . Impotence of organic origin   . Tobacco use disorder   . Pure hypercholesterolemia   . Internal hemorrhoids without mention of complication   . Melanoma 06/2010    Left arm    Past Surgical History  Procedure Date  . Kidney surgery   . Cardiac catheterization   . Angioplasty   . Melanoma excision     Prior to Admission medications   Medication Sig Start Date  End Date Taking? Authorizing Provider  amLODipine (NORVASC) 10 MG tablet Take 10  mg by mouth daily.   Yes Historical Provider, MD  aspirin 325 MG tablet Take 325 mg by mouth daily.   Yes Historical Provider, MD  atenolol (TENORMIN) 25 MG tablet Take 25 mg by mouth daily.   Yes Historical Provider, MD  ezetimibe (ZETIA) 10 MG tablet Take 10 mg by mouth daily.   Yes Historical Provider, MD  ibuprofen (ADVIL,MOTRIN) 200 MG tablet Take 200 mg by mouth every 6 (six) hours as needed.   Yes Historical Provider, MD  isosorbide mononitrate (IMDUR) 60 MG 24 hr tablet Take 60 mg by mouth daily.   Yes Historical Provider, MD  nitroGLYCERIN (NITROSTAT) 0.4 MG SL tablet Place 0.4 mg under the tongue every 5 (five) minutes as needed.   Yes Historical Provider, MD  omega-3 acid ethyl esters (LOVAZA) 1 G capsule Take 1 g by mouth 2 (two) times daily.   Yes Historical Provider, MD  valsartan (DIOVAN) 160 MG tablet Take 320 mg by mouth daily.   Yes Historical Provider, MD    Allergies  Allergen Reactions  . Morphine And Related Hives    History   Social History  . Marital Status: Married    Spouse Name: N/A    Number of Children: 3  . Years of Education: college   Occupational History  . Retired     Product manager   Social History Main Topics  . Smoking status: Current Every Day Smoker    Types: Cigarettes, Pipe, Cigars  . Smokeless tobacco: Current User    Types: Chew  . Alcohol Use: Yes     mininmal one glass of wine twice weekly  . Drug Use: No  . Sexually Active: Not on file   Other Topics Concern  . Not on file   Social History Narrative   Married x 23 years 2nd marriage; 3 children, 2 step-children and 10 grandchildren.Exercise: 5 x week Light; plays golf 5 days per week but rides golf cart, walking 1 mile. Patient DOES not have living will; no prolonged measures but + CPR.    Family History  Problem Relation Age of Onset  . Heart disease Mother   . AAA (abdominal aortic aneurysm) Mother     Objective:   Physical Exam  Nursing note and vitals  reviewed. Constitutional: He is oriented to person, place, and time. He appears well-developed and well-nourished. No distress.  HENT:  Head: Normocephalic and atraumatic.  Right Ear: External ear normal.  Left Ear: External ear normal.  Nose: Nose normal.  Mouth/Throat: Oropharynx is clear and moist. No oropharyngeal exudate.  Eyes: Conjunctivae normal and EOM are normal. Pupils are equal, round, and reactive to light.  Neck: Normal range of motion. Neck supple. No JVD present. No tracheal deviation present. No thyromegaly present.  Cardiovascular: Normal rate, regular rhythm, normal heart sounds and intact distal pulses.  Exam reveals no gallop and no friction rub.   No murmur heard. Pulmonary/Chest: Effort normal. No respiratory distress. He has no wheezes. He has rales in the right lower field. He exhibits no tenderness.  Abdominal: Soft. Bowel sounds are normal. He exhibits no distension. There is no tenderness. There is no rebound and no guarding.  Musculoskeletal: Normal range of motion. He exhibits no edema and no tenderness.  Lymphadenopathy:    He has no cervical adenopathy.  Neurological: He is alert and oriented to person, place, and time. He has normal reflexes. No cranial  nerve deficit. He exhibits normal muscle tone. Coordination normal.  Skin: Skin is warm and dry. No rash noted. He is not diaphoretic. No erythema. No pallor.  Psychiatric: He has a normal mood and affect. His behavior is normal. Judgment and thought content normal.    INFLUENZA VACCINE ADMINISTERED.    Assessment & Plan:   1. Need for influenza vaccination  Flu vaccine greater than or equal to 3yo preservative free IM  2. Routine general medical examination at a health care facility

## 2011-12-03 NOTE — Assessment & Plan Note (Signed)
Stable; s/p annual follow-up with nephrology.

## 2011-12-03 NOTE — Patient Instructions (Addendum)
1. Need for influenza vaccination  Flu vaccine greater than or equal to 76yo preservative free IM

## 2011-12-04 ENCOUNTER — Encounter: Payer: Self-pay | Admitting: Family Medicine

## 2011-12-04 ENCOUNTER — Encounter: Payer: Self-pay | Admitting: *Deleted

## 2011-12-04 NOTE — Progress Notes (Signed)
Reviewed and agree.

## 2011-12-17 ENCOUNTER — Encounter: Payer: Self-pay | Admitting: Family Medicine

## 2012-02-05 HISTORY — PX: APPENDECTOMY: SHX54

## 2012-03-29 ENCOUNTER — Encounter: Payer: Self-pay | Admitting: *Deleted

## 2012-05-07 ENCOUNTER — Encounter: Payer: Self-pay | Admitting: Cardiovascular Disease

## 2012-08-17 ENCOUNTER — Other Ambulatory Visit: Payer: Self-pay | Admitting: Urology

## 2012-08-17 ENCOUNTER — Ambulatory Visit (HOSPITAL_COMMUNITY)
Admission: RE | Admit: 2012-08-17 | Discharge: 2012-08-17 | Disposition: A | Discharge status: Home / Self Care | Payer: Medicare Other | Source: Ambulatory Visit | Attending: Urology | Admitting: Urology

## 2012-08-17 DIAGNOSIS — C649 Malignant neoplasm of unspecified kidney, except renal pelvis: Secondary | ICD-10-CM

## 2012-08-20 ENCOUNTER — Ambulatory Visit: Payer: Medicare Other | Admitting: Cardiovascular Disease

## 2012-08-21 ENCOUNTER — Encounter: Payer: Self-pay | Admitting: Cardiovascular Disease

## 2012-08-21 ENCOUNTER — Ambulatory Visit (INDEPENDENT_AMBULATORY_CARE_PROVIDER_SITE_OTHER): Payer: Medicare Other | Admitting: Cardiovascular Disease

## 2012-08-21 VITALS — BP 102/70 | HR 51 | Ht 67.0 in | Wt 191.7 lb

## 2012-08-21 DIAGNOSIS — E785 Hyperlipidemia, unspecified: Secondary | ICD-10-CM

## 2012-08-21 DIAGNOSIS — I10 Essential (primary) hypertension: Secondary | ICD-10-CM

## 2012-08-21 DIAGNOSIS — I2510 Atherosclerotic heart disease of native coronary artery without angina pectoris: Secondary | ICD-10-CM

## 2012-08-21 MED ORDER — PITAVASTATIN CALCIUM 2 MG PO TABS: 2.0000 mg | ORAL_TABLET | Freq: Every day | ORAL | Status: DC

## 2012-08-21 NOTE — Progress Notes (Signed)
Patient ID: [name], male   DOB: 01-11-1932, 79 y.o.   MRN: [id number]     HPI: Nicholas Douglas, is a 79 y.o. male and a to the office today for a three-month cardiology evaluation.  Nicholas Douglas has no aorta disease and is on numerous interventions dating back to 1987, 1991, 1993, and his most recent one in 39. His last cardiac catheterization was in 2011 which showed preserved LV function with mild residual distal inferior apical hypocontractility. There is evidence for coronary calcification with segmental narrowing of his LAD of 30-40% proximally, 50% diffusely, in the midsegment, and 70-80% in the distal region, he has AV groove circumflex stenoses of 70 and 50% with a 40% on 2 stenosis, and at 3040 and 50% RCA stenoses with 80-90% stenosis in the acute marginal branch. He has been on medical therapy.  He also has a history of significant hyperlipidemia in the past he did notice have significant increased number of small LDL so particles and insulin resistance. This seemed to markedly improve with the addition of Niaspan added to hiszetia and lovaza.  Remotely, he had been on statins consisting of Lipitor and subsequently Crestor. He did have transient LFT elevation. He also concerns of possible risk of developing dementia with statin therapy. When I last saw Nicholas Douglas he was concerned about some episodes of Niaspan and diffuse flushing. He wanted to stop taking his Niaspan. He did have subsequent NMR off Niaspan and done just on Zetia and as well as 2 g of Lovaza.  This showed increased abnormalities such that his total cholesterol was 201, LDL had risen to 124, but he now had LDL small particles which have increased from 685 to 1348 and his LDL particle number which had reduced to 917 on Niaspan was now 2009.   Nicholas Douglas denies recent chest pain. Does play golf 4 days per week. He denies palpitations. He denies myalgias.  Past Medical History  Diagnosis Date  . Myocardial infarction   .  Hypertension   . Personal history of colonic polyps   . Unspecified congenital cystic kidney disease   . Other abnormal blood chemistry   . Other nonspecific abnormal serum enzyme levels   . Malignant neoplasm of kidney, except pelvis   . Calculus of kidney   . Coronary atherosclerosis of unspecified type of vessel, native or graft   . Impotence of organic origin   . Tobacco use disorder   . Pure hypercholesterolemia   . Internal hemorrhoids without mention of complication   . Melanoma 06/2010    Left arm    Past Surgical History  Procedure Laterality Date  . Kidney surgery    . Cardiac catheterization    . Angioplasty    . Melanoma excision    . Colonoscopy  10/05/2008    single polyp, IH.  Nicholas Douglas.  Repeat in 3 years.  . Carotid dopplers  03/08/2011    minimal plaque formation B. Symptoms: dizziness.  . Cardiolite  05/06/2011    low risk study; normal EF.  SE H&V.    Allergies  Allergen Reactions  . Morphine And Related Hives    Current Outpatient Prescriptions  Medication Sig Dispense Refill  . amLODipine (NORVASC) 10 MG tablet Take 10 mg by mouth daily.      Marland Kitchen aspirin 325 MG tablet Take 325 mg by mouth daily.      Marland Kitchen atenolol (TENORMIN) 25 MG tablet Take 25 mg by mouth daily. 1/2 tablet twice daily      .  ezetimibe (ZETIA) 10 MG tablet Take 10 mg by mouth daily.      Marland Kitchen ibuprofen (ADVIL,MOTRIN) 200 MG tablet Take 200 mg by mouth every 6 (six) hours as needed.      . isosorbide mononitrate (IMDUR) 60 MG 24 hr tablet Take 30 mg by mouth daily.       . nitroGLYCERIN (NITROSTAT) 0.4 MG SL tablet Place 0.4 mg under the tongue every 5 (five) minutes as needed.      Marland Kitchen omega-3 acid ethyl esters (LOVAZA) 1 G capsule Take 1 g by mouth 2 (two) times daily.      . Pitavastatin Calcium (LIVALO) 2 MG TABS Take 1 tablet (2 mg total) by mouth daily.  90 tablet  3  . valsartan (DIOVAN) 160 MG tablet Take 160 mg by mouth daily.        No current facility-administered medications for this  visit.    Socially he remains active. He is married has 5 children 10 grandchildren 2 great-grandchildren. Is no alcohol use. He typically scores below 75 and plays golf 4 days per week.  ROS is negative for fevers, chills or night sweats. He denies palpitations. He denies visual symptoms. He denies wheezing. Denies anginal symptoms he denies abdominal pain. There is no nausea vomiting or diarrhea. He denies paresthesias. He denies claudication. There are no myalgias. He denies edema  Other system review is negative.  PE BP 102/70  Pulse 51  Ht 5\' 7"  (1.702 m)  Wt 191 lb 11.2 oz (86.955 kg)  BMI 30.02 kg/m2  General: Alert, oriented, no distress.  Skin: normal turgor, no rashes HEENT: Normocephalic, atraumatic. Pupils round and reactive; sclera anicteric;no lid lag.  Nose without nasal septal hypertrophy Mouth/Parynx benign; Mallinpatti scale 2 Neck: No JVD, no carotid briuts Lungs: clear to ausculatation and percussion; no wheezing or rales Heart: RRR, s1 s2 normal 1/6 sem Abdomen: soft, nontender; no hepatosplenomehaly, BS+; abdominal aorta nontender and not dilated by palpation. Pulses 2+ Extremities: no clubbing cyanosis or edema, Homan's sign negative  Neurologic: grossly nonfocal  ECG: Sinus rhythm at 51 beats per minute. QTc interval 400 ms. No significant ST changes.  LABS:  BMET    Component Value Date/Time   NA 137 05/08/2011 1354   K 4.4 05/08/2011 1354   CL 104 05/08/2011 1354   CO2 25 05/08/2011 1354   GLUCOSE 74 05/08/2011 1354   BUN 22 05/08/2011 1354   CREATININE 1.07 05/08/2011 1354   CALCIUM 9.4 05/08/2011 1354   GFRNONAA 64* 05/08/2011 1354   GFRAA 75* 05/08/2011 1354     Hepatic Function Panel     Component Value Date/Time   PROT 6.5 01/09/2010 0300   ALBUMIN 3.3* 01/09/2010 0300   AST 29 01/09/2010 0300   ALT 29 01/09/2010 0300   ALKPHOS 45 01/09/2010 0300   BILITOT 0.9 01/09/2010 0300     CBC    Component Value Date/Time   WBC 6.5 05/08/2011 1354   RBC 5.18  05/08/2011 1354   HGB 15.4 05/08/2011 1354   HCT 44.6 05/08/2011 1354   PLT 243 05/08/2011 1354   MCV 86.1 05/08/2011 1354   MCH 29.7 05/08/2011 1354   MCHC 34.5 05/08/2011 1354   RDW 13.1 05/08/2011 1354   LYMPHSABS 1.8 05/08/2011 1354   MONOABS 1.0 05/08/2011 1354   EOSABS 0.3 05/08/2011 1354   BASOSABS 0.0 05/08/2011 1354     BNP    Component Value Date/Time   PROBNP 43.0 01/08/2010 1642    Lipid  Panel  No results found for this basename: chol, trig, hdl, cholhdl, vldl, ldlcalc     RADIOLOGY: Dg Chest 2 View  08/17/2012   *RADIOLOGY REPORT*  Clinical Data: Renal cell carcinoma  CHEST - 2 VIEW  Comparison: 05/08/11  Findings: The cardiomediastinal silhouette is stable.  No acute infiltrate or pleural effusion.  No pulmonary edema.  Bony thorax is unremarkable.  IMPRESSION: No active disease.  No significant change.   Original Report Authenticated By: Natasha Mead, M.D.      ASSESSMENT AND PLAN: Nicholas Douglas has established coronary artery disease dating back to 1987 and has undergone multiple interventions in the past over a 10 year period from 1987 to 1997, but has not required repeat interventions over the past 20 years. We have tried to be very aggressive with his lipid strategy. He derived marked benefit with addition of Niaspan to his regimen but unfortunately he did develop some flushing and requested that he be taken off this. His most recent NMR lipoprotein shows marked increase in LDL particle number discontinuance of Niaspan and further significant increase in LDL small particles. I did discuss the possibility of trying another statin and feel he may benefit from the addition of low follow. I provided him with samples to initiate at 2 mg daily. Approximately 2 months we will repeat an NMR lipoprofile on therapy including  CMP. I will see him in 3 months for followup evaluation.     Lennette Bihari, MD, [facility]  08/21/2012 10:04 AM

## 2012-08-21 NOTE — Patient Instructions (Signed)
Your physician recommends that you return for lab work in: 2 months. Your physician has recommended you make the following change in your medication: start Livalo 2mg  as directed. Your physician recommends that you schedule a follow-up appointment in: 3 months.

## 2012-08-26 ENCOUNTER — Ambulatory Visit: Payer: Self-pay | Admitting: Surgery

## 2012-08-26 LAB — COMPREHENSIVE METABOLIC PANEL
Albumin: 3.7 g/dL (ref 3.4–5.0)
Alkaline Phosphatase: 73 U/L (ref 50–136)
BUN: 22 mg/dL — ABNORMAL HIGH (ref 7–18)
Chloride: 104 mmol/L (ref 98–107)
Co2: 28 mmol/L (ref 21–32)
Creatinine: 1.25 mg/dL (ref 0.60–1.30)
EGFR (African American): >60
EGFR (Non-African Amer.): 54 — ABNORMAL LOW
Glucose: 117 mg/dL — ABNORMAL HIGH (ref 65–99)
Osmolality: 274 (ref 275–301)
SGOT(AST): 35 U/L (ref 15–37)
SGPT (ALT): 39 U/L (ref 12–78)
Sodium: 135 mmol/L — ABNORMAL LOW (ref 136–145)
Total Protein: 7.7 g/dL (ref 6.4–8.2)

## 2012-08-26 LAB — URINALYSIS, COMPLETE
Bilirubin,UR: NEGATIVE
Blood: NEGATIVE
Ketone: NEGATIVE
Nitrite: NEGATIVE
Ph: 5 (ref 4.5–8.0)
Protein: NEGATIVE
Squamous Epithelial: NONE SEEN

## 2012-08-26 LAB — CBC
HGB: 15.3 g/dL (ref 13.0–18.0)
MCH: 29.1 pg (ref 26.0–34.0)
MCHC: 33.6 g/dL (ref 32.0–36.0)
MCV: 87 fL (ref 80–100)
RBC: 5.27 10*6/uL (ref 4.40–5.90)
RDW: 12.7 % (ref 11.5–14.5)
WBC: 11.6 10*3/uL — ABNORMAL HIGH (ref 3.8–10.6)

## 2012-08-26 LAB — LIPASE, BLOOD: Lipase: 1250 U/L — ABNORMAL HIGH (ref 73–393)

## 2012-08-26 LAB — AMYLASE: Amylase: 151 U/L — ABNORMAL HIGH (ref 25–115)

## 2012-08-27 LAB — URINALYSIS, COMPLETE
Bacteria: NONE SEEN
Bilirubin,UR: NEGATIVE
Glucose,UR: NEGATIVE mg/dL (ref 0–75)
Nitrite: NEGATIVE
RBC,UR: 1 /HPF (ref 0–5)
Specific Gravity: 1.011 (ref 1.003–1.030)
Squamous Epithelial: NONE SEEN

## 2012-08-31 ENCOUNTER — Other Ambulatory Visit: Payer: Self-pay | Admitting: Cardiovascular Disease

## 2012-08-31 NOTE — Telephone Encounter (Signed)
Rx was sent to pharmacy electronically. Prior Authorization for Livalo in progress.

## 2012-08-31 NOTE — Telephone Encounter (Signed)
Need prior authorization for his Livalo 2mg -Call this to [facility]-[phone]-please call him when this is taken care of.

## 2012-09-01 ENCOUNTER — Other Ambulatory Visit: Payer: Self-pay

## 2012-09-04 ENCOUNTER — Telehealth: Payer: Self-pay

## 2012-09-04 NOTE — Telephone Encounter (Signed)
Prior Auth sent on 7/30 for Livalo 2mg  was approved. Case ID # [id number]

## 2012-11-16 ENCOUNTER — Other Ambulatory Visit: Payer: Self-pay | Admitting: *Deleted

## 2012-11-16 MED ORDER — PITAVASTATIN CALCIUM 4 MG PO TABS: 1.0000 | ORAL_TABLET | Freq: Every day | ORAL | Status: DC

## 2012-11-16 NOTE — Progress Notes (Signed)
Quick Note:  Spoke with patient and wife. Gave lab results.instructed to increase the Livalo to 4mg . Keep appointment on Friday to discuss in further detail with Dr. [name]. [facility]. ______

## 2012-11-20 ENCOUNTER — Ambulatory Visit (INDEPENDENT_AMBULATORY_CARE_PROVIDER_SITE_OTHER): Payer: Medicare Other | Admitting: Cardiovascular Disease

## 2012-11-20 ENCOUNTER — Encounter: Payer: Self-pay | Admitting: Cardiovascular Disease

## 2012-11-20 VITALS — BP 142/62 | HR 53 | Ht 67.5 in | Wt 194.9 lb

## 2012-11-20 DIAGNOSIS — Z9889 Other specified postprocedural states: Secondary | ICD-10-CM

## 2012-11-20 DIAGNOSIS — I2510 Atherosclerotic heart disease of native coronary artery without angina pectoris: Secondary | ICD-10-CM

## 2012-11-20 DIAGNOSIS — Z9049 Acquired absence of other specified parts of digestive tract: Secondary | ICD-10-CM | POA: Insufficient documentation

## 2012-11-20 DIAGNOSIS — E785 Hyperlipidemia, unspecified: Secondary | ICD-10-CM

## 2012-11-20 NOTE — Patient Instructions (Addendum)
Your physician has recommended you make the following change in your medication: decrease the diovan down to 80 mg daily as long as blood pressure is down. If your blood pressure goes back up, then return to previous dose.   Your physician recommends that you return for lab work and office appointment in: 3 months.

## 2012-11-20 NOTE — Progress Notes (Signed)
Patient ID: Nicholas Douglas, male   DOB: 1931/07/30, 79 y.o.   MRN: [id number]      HPI: Nicholas Douglas, is a 79 y.o. male and a to the office today for a three-month cardiology evaluation. Since I Iast saw him in early July, he underwent an emergent appendectomy on July 23 at [facility].  Nicholas Douglas has known CAD and underwent numerous interventions dating back to 1987, 1991, 1993, and his most recent one in 55. His last cardiac catheterization was in 2011 which showed preserved LV function with mild residual distal inferior apical hypocontractility. There is evidence for coronary calcification with segmental narrowing of his LAD of 30-40% proximally, 50% diffusely, in the midsegment, and 70-80% in the distal region, he has AV groove circumflex stenoses of 70 and 50% with a 40% on 2 stenosis, and at 3040 and 50% RCA stenoses with 80-90% stenosis in the acute marginal branch. He has been on medical therapy.  He also has a history of significant hyperlipidemia and has had significant increased number of small LDL particles and insulin resistance. This seemed to markedly improve with the addition of Niaspan added to zetia and lovaza.  Remotely, he had been on statins consisting of Lipitor and subsequently Crestor. He did have transient LFT elevation. He also concerns of possible risk of developing dementia with statin therapy.  He had  some episodes of Niaspan induced diffuse flushing. He wanted to stop taking his Niaspan. He did have subsequent NMR off Niaspan and done just on Zetia and as well as 2 g of Lovaza.  This showed increased abnormalities such that his total cholesterol was 201, LDL had risen to 124, but he now had LDL small particles which have increased from 685 to 1348. When I last saw him, we elected to try little low and ultimately titrated this to 4 mg daily to take in addition to his Zetia 10 mg per apparently in late July he developed abdominal discomfort and was found to have acute  appendicitis. On CT imaging he was also noted to have prostate enlargement with nodularity and thickening of the bladder base and cystoscopy was suggested for further evaluation. He is status post left nephrectomy. He also was noted to have a 2.3 cm infrarenal suprailiac abdominal aortic aneurysm.  Presently, Nicholas Douglas denies chest pain. He denies shortness of breath. He did lose weight following his appendectomy but subsequently has gained the weight back up to 194 the on his most recent laboratory which was done on 10/26/2012 LDL particle number was elevated at 1657, LDL cholesterol 112 triglycerides 155 and total cholesterol 190. He continued to be insulin resistant with insulin resistance scored 65. Abnormal liver function studies. Of note, serum creatinine has risen to 1.33 and his estimated GFR was approximately 50 states his blood pressure at home tends to be relatively low typically in the 110s to less than 130.   Past Medical History  Diagnosis Date  . Myocardial infarction   . Hypertension   . Personal history of colonic polyps   . Unspecified congenital cystic kidney disease   . Other abnormal blood chemistry   . Other nonspecific abnormal serum enzyme levels   . Malignant neoplasm of kidney, except pelvis   . Calculus of kidney   . Coronary atherosclerosis of unspecified type of vessel, native or graft   . Impotence of organic origin   . Tobacco use disorder   . Pure hypercholesterolemia   . Internal hemorrhoids without mention of  complication   . Melanoma 06/2010    Left arm    Past Surgical History  Procedure Laterality Date  . Kidney surgery    . Cardiac catheterization    . Angioplasty    . Melanoma excision    . Colonoscopy  10/05/2008    single polyp, IH.  [name].  Repeat in 3 years.  . Carotid dopplers  03/08/2011    minimal plaque formation B. Symptoms: dizziness.  . Cardiolite  05/06/2011    low risk study; normal EF.  SE H&V.    Allergies  Allergen Reactions    . Morphine And Related Hives    Current Outpatient Prescriptions  Medication Sig Dispense Refill  . amLODipine (NORVASC) 10 MG tablet TAKE 1 TABLET DAILY  90 tablet  3  . aspirin 325 MG tablet Take 325 mg by mouth daily.      Marland Kitchen atenolol (TENORMIN) 25 MG tablet Take 25 mg by mouth daily. 1/2 tablet twice daily      . ezetimibe (ZETIA) 10 MG tablet Take 10 mg by mouth daily.      Marland Kitchen ibuprofen (ADVIL,MOTRIN) 200 MG tablet Take 200 mg by mouth every 6 (six) hours as needed.      . isosorbide mononitrate (IMDUR) 60 MG 24 hr tablet Take 30 mg by mouth daily.       . nitroGLYCERIN (NITROSTAT) 0.4 MG SL tablet Place 0.4 mg under the tongue every 5 (five) minutes as needed.      Marland Kitchen omega-3 acid ethyl esters (LOVAZA) 1 G capsule Take 1 g by mouth 2 (two) times daily.      . Pitavastatin Calcium (LIVALO) 4 MG TABS Take 1 tablet (4 mg total) by mouth daily.  90 tablet  3  . valsartan (DIOVAN) 160 MG tablet Take 160 mg by mouth daily.        No current facility-administered medications for this visit.    Socially he remains active. He is married has 5 children 10 grandchildren 2 great-grandchildren. Is no alcohol use. He typically scores below 75 and plays golf 4 days per week.  ROS is negative for fevers, chills or night sweats. He denies palpitations. He denies visual symptoms. There is no cough He denies wheezing; he denies palpitations. There is no presyncope or syncope. Denies anginal symptoms he denies abdominal pain since his appendectomy.. There is no nausea vomiting or diarrhea. He denies paresthesias. He denies claudication. There are no myalgias. He denies edema;  he denies change  in urination Other 12 point system review is negative.  PE BP 142/62  Pulse 53  Ht 5' 7.5" (1.715 m)  Wt 194 lb 14.4 oz (88.406 kg)  BMI 30.06 kg/m2  General: Alert, oriented, no distress.  Skin: normal turgor, no rashes HEENT: Normocephalic, atraumatic. Pupils round and reactive; sclera anicteric;no lid lag.   Nose without nasal septal hypertrophy Mouth/Parynx benign; Mallinpatti scale 2 Neck: No JVD, no carotid briuts Lungs: clear to ausculatation and percussion; no wheezing or rales Heart: RRR, s1 s2 normal 1/6 sem Abdomen: soft, nontender; no hepatosplenomehaly, BS+; abdominal aorta nontender and not dilated by palpation. Pulses 2+ Extremities: no clubbing cyanosis or edema, Homan's sign negative  Neurologic: grossly nonfocal  ECG: Sinus rhythm at 51 beats per minute. QTc interval 400 ms. No significant ST changes.  LABS:  BMET    Component Value Date/Time   NA 137 05/08/2011 1354   K 4.4 05/08/2011 1354   CL 104 05/08/2011 1354   CO2 25 05/08/2011  1354   GLUCOSE 74 05/08/2011 1354   BUN 22 05/08/2011 1354   CREATININE 1.07 05/08/2011 1354   CALCIUM 9.4 05/08/2011 1354   GFRNONAA 64* 05/08/2011 1354   GFRAA 75* 05/08/2011 1354     Hepatic Function Panel     Component Value Date/Time   PROT 6.5 01/09/2010 0300   ALBUMIN 3.3* 01/09/2010 0300   AST 29 01/09/2010 0300   ALT 29 01/09/2010 0300   ALKPHOS 45 01/09/2010 0300   BILITOT 0.9 01/09/2010 0300     CBC    Component Value Date/Time   WBC 6.5 05/08/2011 1354   RBC 5.18 05/08/2011 1354   HGB 15.4 05/08/2011 1354   HCT 44.6 05/08/2011 1354   PLT 243 05/08/2011 1354   MCV 86.1 05/08/2011 1354   MCH 29.7 05/08/2011 1354   MCHC 34.5 05/08/2011 1354   RDW 13.1 05/08/2011 1354   LYMPHSABS 1.8 05/08/2011 1354   MONOABS 1.0 05/08/2011 1354   EOSABS 0.3 05/08/2011 1354   BASOSABS 0.0 05/08/2011 1354     BNP    Component Value Date/Time   PROBNP 43.0 01/08/2010 1642    Lipid Panel  No results found for this basename: chol,  trig,  hdl,  cholhdl,  vldl,  ldlcalc     RADIOLOGY: Dg Chest 2 View  08/17/2012   *RADIOLOGY REPORT*  Clinical Data: Renal cell carcinoma  CHEST - 2 VIEW  Comparison: 05/08/11  Findings: The cardiomediastinal silhouette is stable.  No acute infiltrate or pleural effusion.  No pulmonary edema.  Bony thorax is unremarkable.  IMPRESSION:  No active disease.  No significant change.   Original Report Authenticated By: Natasha Mead, M.D.      ASSESSMENT AND PLAN: Nicholas Douglas has established coronary artery disease dating back to 1987 and has undergone multiple interventions in the past over a 10 year period from 1987 to 1997, but has not required repeat interventions over the past 20 years. In the past, he did develop mild LFT elevation while on Lipitor and Crestor. Niaspan was very beneficial in significantly reducing his LDL particle number as well as shifting his particle size but unfortunately developed significant flushing symptoms related to this. He now has been on a low bowel lobe up to 4 mg. His diet has not been as optimal as it had in the past particularly since his appendectomy. He does have mild renal insufficiency with incoherence no estimated at 50. His blood pressure on repeat by me today was 110/60. I'm recommending a trial of reducing his Diovan from 160 mg to 80 mg. I also suggested increasing his Lovaza. We discussed improved dietary compliance. We discussed exercise and weight loss. In 3 months he will undergo a followup seen at an NMR profile. I will see him back in the office in 4 months for cardiology reevaluation.  Lennette Bihari, MD, [facility]  11/20/2012 8:41 AM

## 2012-11-23 ENCOUNTER — Encounter: Payer: Self-pay | Admitting: Cardiovascular Disease

## 2012-12-07 ENCOUNTER — Encounter: Payer: Medicare Other | Admitting: Family Medicine

## 2012-12-10 ENCOUNTER — Other Ambulatory Visit: Payer: Self-pay

## 2012-12-28 ENCOUNTER — Other Ambulatory Visit: Payer: Self-pay | Admitting: Cardiovascular Disease

## 2012-12-28 NOTE — Telephone Encounter (Signed)
Rx was sent to pharmacy electronically. 

## 2013-02-22 ENCOUNTER — Other Ambulatory Visit: Payer: Self-pay | Admitting: Cardiovascular Disease

## 2013-02-23 LAB — NMR, LIPOPROFILE
CHOLESTEROL: 116 mg/dL (ref <200)
HDL Cholesterol by NMR: 40 mg/dL (ref >=40)
HDL Particle Number: 30.6 umol/L (ref >=30.5)
LDL Particle Number: 774 nmol/L (ref <1000)
LDL SIZE: 20.3 nm — AB (ref >20.5)
LDLC SERPL CALC-MCNC: 50 mg/dL (ref <100)
LP-IR SCORE: 51 — AB (ref <=45)
SMALL LDL PARTICLE NUMBER: 417 nmol/L (ref <=527)
TRIGLYCERIDES BY NMR: 131 mg/dL (ref <150)

## 2013-02-23 LAB — COMPREHENSIVE METABOLIC PANEL
ALK PHOS: 52 IU/L (ref 39–117)
ALT: 58 IU/L — AB (ref 0–44)
AST: 45 IU/L — AB (ref 0–40)
Albumin/Globulin Ratio: 1.7 (ref 1.1–2.5)
Albumin: 4.5 g/dL (ref 3.5–4.8)
BUN / CREAT RATIO: 14 (ref 10–22)
BUN: 16 mg/dL (ref 8–27)
CALCIUM: 9.6 mg/dL (ref 8.6–10.2)
CHLORIDE: 102 mmol/L (ref 97–108)
CO2: 25 mmol/L (ref 18–29)
Creatinine, Ser: 1.16 mg/dL (ref 0.76–1.27)
GFR calc Af Amer: 69 mL/min/{1.73_m2} (ref >59)
GFR calc non Af Amer: 60 mL/min/{1.73_m2} (ref >59)
Globulin, Total: 2.6 g/dL (ref 1.5–4.5)
Glucose: 105 mg/dL — ABNORMAL HIGH (ref 65–99)
POTASSIUM: 5.4 mmol/L — AB (ref 3.5–5.2)
SODIUM: 143 mmol/L (ref 134–144)
Total Bilirubin: 0.8 mg/dL (ref 0.0–1.2)
Total Protein: 7.1 g/dL (ref 6.0–8.5)

## 2013-03-01 ENCOUNTER — Ambulatory Visit (INDEPENDENT_AMBULATORY_CARE_PROVIDER_SITE_OTHER): Payer: Medicare Other | Admitting: Cardiovascular Disease

## 2013-03-01 ENCOUNTER — Encounter: Payer: Self-pay | Admitting: Cardiovascular Disease

## 2013-03-01 VITALS — BP 138/80 | HR 52 | Ht 67.0 in | Wt 195.8 lb

## 2013-03-01 DIAGNOSIS — I10 Essential (primary) hypertension: Secondary | ICD-10-CM

## 2013-03-01 DIAGNOSIS — E782 Mixed hyperlipidemia: Secondary | ICD-10-CM

## 2013-03-01 DIAGNOSIS — E785 Hyperlipidemia, unspecified: Secondary | ICD-10-CM

## 2013-03-01 DIAGNOSIS — I2510 Atherosclerotic heart disease of native coronary artery without angina pectoris: Secondary | ICD-10-CM

## 2013-03-01 DIAGNOSIS — C649 Malignant neoplasm of unspecified kidney, except renal pelvis: Secondary | ICD-10-CM

## 2013-03-01 NOTE — Progress Notes (Signed)
Patient ID: Nicholas Douglas, male   DOB: 01-28-1932, 80 y.o.   MRN: [id number]      HPI: Nicholas Douglas, is a 80 y.o. male and a to the office today for a three-month cardiology evaluation.   Nicholas Douglas has known CAD and underwent numerous interventions dating back to 1987, 1991, 1993, and his most recent one in 40. His last cardiac catheterization was in 2011 which showed preserved LV function with mild residual distal inferior apical hypocontractility. There is evidence for coronary calcification with segmental narrowing of his LAD of 30-40% proximally, 50% diffusely, in the midsegment, and 70-80% in the distal region, he has AV groove circumflex stenoses of 70 and 50% with a 40% OM 2 stenosis, and a 30-40 and 50% RCA stenoses with 80-90% stenosis in the acute marginal branch. He has been on medical therapy.  He also has a history of significant hyperlipidemia and has had significant increased number of small LDL particles and insulin resistance. This seemed to markedly improve with the addition of Niaspan added to zetia and lovaza.  Remotely, he had been on statins consisting of Lipitor and subsequently Crestor. He did have transient LFT elevation. He also concerns of possible risk of developing dementia with statin therapy.  He had  some episodes of Niaspan induced diffuse flushing. He wanted to stop taking his Niaspan. He did have subsequent NMR off Niaspan and done just on Zetia and as well as 2 g of Lovaza.  This showed increased abnormalities such that his total cholesterol was 201, LDL had risen to 124, but he now had LDL small particles which have increased from 685 to 1348. When I last saw him, we elected to try little low and ultimately titrated this to 4 mg daily to take in addition to his Zetia 10 mg per apparently in late July he developed abdominal discomfort and was found to have acute appendicitis. On CT imaging he was also noted to have prostate enlargement with nodularity and thickening of the  bladder base and cystoscopy was suggested for further evaluation. He is status post left nephrectomy. He also was noted to have a 2.3 cm infrarenal suprailiac abdominal aortic aneurysm.  Presently, Nicholas Douglas denies chest pain. He denies shortness of breath. He did lose weight following his appendectomy but subsequently has gained the weight back up to 194.  Laboratory on 10/26/2012 LDL particle number was elevated at 1657, LDL cholesterol 112 triglycerides 155 and total cholesterol 190. He continued to be insulin resistant with insulin resistance scored 65.  Of note, serum creatinine has risen to 1.33 and his estimated GFR was approximately 50 states his blood pressure at home tends to be relatively low typically in the 110s to less than 130.  I last saw him, because his blood pressure was low and reduced his valsartan from 160 mg to 80 mg. Inadvertently, the patient has only been taking 40 mg since his wife has been cutting this 160 mg pill in one corner. He has tolerated low. He stopped taking his isosorbide secondary to headache and dizziness.  He did undergo recent laboratory on 02/22/2013. His LDL particle number was now markedly improved at 774 with an HDL of 40 calculated LDL 50 small LDL particle #417. Insulin resistance score was also improved at 51. He did have very minimal transaminase elevation with an AST of 45 and an ALT of 58. Potassium was 5.4. He does admit to recently eating foods with increased potassium source. Creatinine was 1.16  Past Medical History  Diagnosis Date  . Myocardial infarction   . Hypertension   . Personal history of colonic polyps   . Unspecified congenital cystic kidney disease   . Other abnormal blood chemistry   . Other nonspecific abnormal serum enzyme levels   . Malignant neoplasm of kidney, except pelvis   . Calculus of kidney   . Coronary atherosclerosis of unspecified type of vessel, native or graft   . Impotence of organic origin   . Tobacco use  disorder   . Pure hypercholesterolemia   . Internal hemorrhoids without mention of complication   . Melanoma 06/2010    Left arm    Past Surgical History  Procedure Laterality Date  . Kidney surgery    . Cardiac catheterization    . Angioplasty    . Melanoma excision    . Colonoscopy  10/05/2008    single polyp, IH.  [name].  Repeat in 3 years.  . Carotid dopplers  03/08/2011    minimal plaque formation B. Symptoms: dizziness.  . Cardiolite  05/06/2011    low risk study; normal EF.  SE H&V.    Allergies  Allergen Reactions  . Morphine And Related Hives    Current Outpatient Prescriptions  Medication Sig Dispense Refill  . amLODipine (NORVASC) 10 MG tablet TAKE 1 TABLET DAILY  90 tablet  3  . aspirin 325 MG tablet Take 325 mg by mouth daily.      Marland Kitchen atenolol (TENORMIN) 25 MG tablet Take 0.5 tablets (12.5 mg total) by mouth 2 (two) times daily.  90 tablet  3  . finasteride (PROSCAR) 5 MG tablet Take 0.5 tablets by mouth daily.      Marland Kitchen ibuprofen (ADVIL,MOTRIN) 200 MG tablet Take 200 mg by mouth every 6 (six) hours as needed.      . nitroGLYCERIN (NITROSTAT) 0.4 MG SL tablet Place 0.4 mg under the tongue every 5 (five) minutes as needed.      Marland Kitchen omega-3 acid ethyl esters (LOVAZA) 1 G capsule Take 1 g by mouth 2 (two) times daily.      . Pitavastatin Calcium (LIVALO) 4 MG TABS Take 1 tablet (4 mg total) by mouth daily.  90 tablet  3  . valsartan (DIOVAN) 160 MG tablet Take 160 mg by mouth daily. Patient takes 1/4 tablet daliy. ( 40 mg)      . ZETIA 10 MG tablet TAKE 1 TABLET DAILY  90 tablet  3   No current facility-administered medications for this visit.    Socially he remains active. He is married has 5 children 10 grandchildren 2 great-grandchildren. Is no alcohol use. He typically scores below 75 and plays golf 4 days per week.  ROS is negative for fevers, chills or night sweats.He denies visual symptoms. He denies change in hearing. There is no lymphadenopathy There is no cough  He denies wheezing; he denies palpitations. There is no presyncope or syncope. Denies anginal symptoms. He denies abdominal pain since his appendectomy. There is no nausea vomiting or diarrhea. There is no blood in stool or urine. He denies paresthesias. He denies claudication. There are no myalgias. He denies edema. He denies cold or heat intolerance. His dizziness improved with discontinuance of his isosorbide. Other comprehensive 14 point system review is negative.  PE BP 138/80  Pulse 52  Ht 5\' 7"  (1.702 m)  Wt 195 lb 12.8 oz (88.814 kg)  BMI 30.66 kg/m2  General: Alert, oriented, no distress.  Skin: normal turgor, no rashes  HEENT: Normocephalic, atraumatic. Pupils round and reactive; sclera anicteric;no lid lag.  Nose without nasal septal hypertrophy Mouth/Parynx benign; Mallinpatti scale 2 Neck: No JVD, no carotid bruits; normal carotid up stroke Lungs: clear to ausculatation and percussion; no wheezing or rales Chest wall: Nontender to palpation the Heart: RRR, s1 s2 normal 1/6 sem Abdomen: soft, nontender; no hepatosplenomehaly, BS+; abdominal aorta nontender and not dilated by palpation. Back: No CVA tenderness. Pulses 2+ Extremities: no clubbing cyanosis or edema, Homan's sign negative  Neurologic: grossly nonfocal  ECG (independently read by me): Sinus bradycardia 52 beats per minute. Normal intervals. No significant ST changes.  Prior ECG of 11/20/2012: Sinus rhythm at 51 beats per minute. QTc interval 400 ms. No significant ST changes.  LABS:  BMET    Component Value Date/Time   NA 143 02/22/2013 0804   NA 137 05/08/2011 1354   K 5.4* 02/22/2013 0804   CL 102 02/22/2013 0804   CO2 25 02/22/2013 0804   GLUCOSE 105* 02/22/2013 0804   GLUCOSE 74 05/08/2011 1354   BUN 16 02/22/2013 0804   BUN 22 05/08/2011 1354   CREATININE 1.16 02/22/2013 0804   CALCIUM 9.6 02/22/2013 0804   GFRNONAA 60 02/22/2013 0804   GFRAA 69 02/22/2013 0804     Hepatic Function Panel     Component  Value Date/Time   PROT 7.1 02/22/2013 0804   PROT 6.5 01/09/2010 0300   ALBUMIN 3.3* 01/09/2010 0300   AST 45* 02/22/2013 0804   ALT 58* 02/22/2013 0804   ALKPHOS 52 02/22/2013 0804   BILITOT 0.8 02/22/2013 0804     CBC    Component Value Date/Time   WBC 6.5 05/08/2011 1354   RBC 5.18 05/08/2011 1354   HGB 15.4 05/08/2011 1354   HCT 44.6 05/08/2011 1354   PLT 243 05/08/2011 1354   MCV 86.1 05/08/2011 1354   MCH 29.7 05/08/2011 1354   MCHC 34.5 05/08/2011 1354   RDW 13.1 05/08/2011 1354   LYMPHSABS 1.8 05/08/2011 1354   MONOABS 1.0 05/08/2011 1354   EOSABS 0.3 05/08/2011 1354   BASOSABS 0.0 05/08/2011 1354     BNP    Component Value Date/Time   PROBNP 43.0 01/08/2010 1642    Lipid Panel     Component Value Date/Time   CHOL 116 02/22/2013 0804     RADIOLOGY: Dg Chest 2 View  08/17/2012   *RADIOLOGY REPORT*  Clinical Data: Renal cell carcinoma  CHEST - 2 VIEW  Comparison: 05/08/11  Findings: The cardiomediastinal silhouette is stable.  No acute infiltrate or pleural effusion.  No pulmonary edema.  Bony thorax is unremarkable.  IMPRESSION: No active disease.  No significant change.   Original Report Authenticated By: Lahoma Crocker, M.D.      ASSESSMENT AND PLAN: Nicholas Douglas has established coronary artery disease dating back to 1987 and has undergone multiple interventions in the past over a 10 year period from 1987 to 1997, but has not required repeat interventions over the past 20 years. His last nuclear perfusion study was in April 2013 which remained stable. He's not having any anginal symptoms on his current medical regimen. Nicholas Douglas lipid profile is now markedly improved with an LDL particle number at goal. In the past he did develop mild transaminase elevations with statins. I am reducing his Livalo from 4 mg to 2 mg. Q. continued to take the Zetia 10 mg as prescribed. Also discussed with him recent to Improve-it trial data. I am suggesting that he resume the valsartan at  80 mg rather than take 40 mg.  We discussed the importance of reducing the potassium in his diet. His renal function is stable. He is status post left nephrectomy. He does have evidence for mild infrarenal suprailiac abdominal aortic aneurysm. Regards to antiplatelet therapy I recommended he reduce his aspirin from 325 mg to 81 mg. In approximately 4-6 weeks we'll repeat his comprehensive metabolic panel as well as a lipid profile with his medication adjustments. As long as he remains stable, I will contact him regarding the laboratory for adjustments need to be made in his medical regimen. I will see him in 6 months for cardiology reevaluation.  [name], MD, [facility]  03/01/2013 10:34 AM

## 2013-03-01 NOTE — Patient Instructions (Addendum)
Your physician has recommended you make the following change in your medication: decrease the asaprin  to 81 mg daily. Decrease the livalo to  1/2 tablet daily. Increase the valsartan to 1/2 tablet daily from 1/4.  Your physician recommends that you return for lab work in: 6 weeks fasting.  Your physician recommends that you schedule a follow-up appointment in: 6 MONTHS.  Decrease foods that contain potassium. You may find a listing of these foods on the internet.

## 2013-03-16 ENCOUNTER — Other Ambulatory Visit: Payer: Self-pay | Admitting: *Deleted

## 2013-03-16 DIAGNOSIS — Z79899 Other long term (current) drug therapy: Secondary | ICD-10-CM

## 2013-04-03 ENCOUNTER — Observation Stay: Payer: Self-pay | Admitting: Specialist

## 2013-04-03 DIAGNOSIS — R079 Chest pain, unspecified: Secondary | ICD-10-CM

## 2013-04-03 LAB — URINALYSIS, COMPLETE
BACTERIA: NONE SEEN
Bilirubin,UR: NEGATIVE
Blood: NEGATIVE
Glucose,UR: NEGATIVE mg/dL (ref 0–75)
Ketone: NEGATIVE
Leukocyte Esterase: NEGATIVE
NITRITE: NEGATIVE
PH: 6 (ref 4.5–8.0)
PROTEIN: NEGATIVE
Specific Gravity: 1.014 (ref 1.003–1.030)
Squamous Epithelial: NONE SEEN

## 2013-04-03 LAB — CBC WITH DIFFERENTIAL/PLATELET
Basophil #: 0.2 10*3/uL — ABNORMAL HIGH (ref 0.0–0.1)
Basophil %: 2.7 %
EOS PCT: 5.1 %
Eosinophil #: 0.4 10*3/uL (ref 0.0–0.7)
HCT: 47.1 % (ref 40.0–52.0)
HGB: 15.6 g/dL (ref 13.0–18.0)
Lymphocyte #: 1.6 10*3/uL (ref 1.0–3.6)
Lymphocyte %: 22.8 %
MCH: 29.3 pg (ref 26.0–34.0)
MCHC: 33.2 g/dL (ref 32.0–36.0)
MCV: 88 fL (ref 80–100)
MONO ABS: 1 x10 3/mm (ref 0.2–1.0)
Monocyte %: 14 %
NEUTROS ABS: 3.9 10*3/uL (ref 1.4–6.5)
Neutrophil %: 55.4 %
Platelet: 194 10*3/uL (ref 150–440)
RBC: 5.33 10*6/uL (ref 4.40–5.90)
RDW: 13.5 % (ref 11.5–14.5)
WBC: 7 10*3/uL (ref 3.8–10.6)

## 2013-04-03 LAB — COMPREHENSIVE METABOLIC PANEL
ALK PHOS: 56 U/L
ALT: 68 U/L (ref 12–78)
Albumin: 3.4 g/dL (ref 3.4–5.0)
Anion Gap: 6 — ABNORMAL LOW (ref 7–16)
BUN: 19 mg/dL — ABNORMAL HIGH (ref 7–18)
Bilirubin,Total: 0.6 mg/dL (ref 0.2–1.0)
CREATININE: 1.35 mg/dL — AB (ref 0.60–1.30)
Calcium, Total: 8.7 mg/dL (ref 8.5–10.1)
Chloride: 107 mmol/L (ref 98–107)
Co2: 27 mmol/L (ref 21–32)
EGFR (African American): 57 — ABNORMAL LOW
EGFR (Non-African Amer.): 49 — ABNORMAL LOW
Glucose: 98 mg/dL (ref 65–99)
Osmolality: 282 (ref 275–301)
Potassium: 4.8 mmol/L (ref 3.5–5.1)
SGOT(AST): 52 U/L — ABNORMAL HIGH (ref 15–37)
Sodium: 140 mmol/L (ref 136–145)
Total Protein: 7.5 g/dL (ref 6.4–8.2)

## 2013-04-03 LAB — CK-MB
CK-MB: 1.1 ng/mL (ref 0.5–3.6)
CK-MB: 1 ng/mL (ref 0.5–3.6)

## 2013-04-03 LAB — PRO B NATRIURETIC PEPTIDE: B-Type Natriuretic Peptide: 196 pg/mL (ref 0–450)

## 2013-04-03 LAB — TROPONIN I
Troponin-I: <0.02 ng/mL
Troponin-I: <0.02 ng/mL
Troponin-I: <0.02 ng/mL

## 2013-04-03 LAB — CK TOTAL AND CKMB (NOT AT ARMC)
CK, Total: 206 U/L
CK-MB: 0.9 ng/mL (ref 0.5–3.6)

## 2013-04-04 LAB — CBC WITH DIFFERENTIAL/PLATELET
Basophil #: 0 10*3/uL (ref 0.0–0.1)
Basophil #: 0 10*3/uL (ref 0.0–0.1)
Basophil %: 0.5 %
Basophil %: 0.8 %
EOS ABS: 0.3 10*3/uL (ref 0.0–0.7)
EOS PCT: 4.5 %
Eosinophil #: 0.3 10*3/uL (ref 0.0–0.7)
Eosinophil %: 4.6 %
HCT: 43.8 % (ref 40.0–52.0)
HCT: 45.9 % (ref 40.0–52.0)
HGB: 14.6 g/dL (ref 13.0–18.0)
HGB: 14.7 g/dL (ref 13.0–18.0)
LYMPHS ABS: 1.9 10*3/uL (ref 1.0–3.6)
LYMPHS PCT: 31.4 %
Lymphocyte #: 1.7 10*3/uL (ref 1.0–3.6)
Lymphocyte %: 27.7 %
MCH: 29.3 pg (ref 26.0–34.0)
MCH: 28.4 pg (ref 26.0–34.0)
MCHC: 33.4 g/dL (ref 32.0–36.0)
MCHC: 32 g/dL (ref 32.0–36.0)
MCV: 88 fL (ref 80–100)
MCV: 89 fL (ref 80–100)
MONO ABS: 0.7 x10 3/mm (ref 0.2–1.0)
MONOS PCT: 12.7 %
Monocyte #: 0.8 x10 3/mm (ref 0.2–1.0)
Monocyte %: 12.2 %
Neutrophil #: 3.1 10*3/uL (ref 1.4–6.5)
Neutrophil #: 3.4 10*3/uL (ref 1.4–6.5)
Neutrophil %: 51.3 %
Neutrophil %: 54.3 %
PLATELETS: 202 10*3/uL (ref 150–440)
Platelet: 213 10*3/uL (ref 150–440)
RBC: 4.99 10*6/uL (ref 4.40–5.90)
RBC: 5.18 10*6/uL (ref 4.40–5.90)
RDW: 13.1 % (ref 11.5–14.5)
RDW: 13.4 % (ref 11.5–14.5)
WBC: 6 10*3/uL (ref 3.8–10.6)
WBC: 6.3 10*3/uL (ref 3.8–10.6)

## 2013-04-04 LAB — BASIC METABOLIC PANEL
ANION GAP: 3 — AB (ref 7–16)
BUN: 18 mg/dL (ref 7–18)
CALCIUM: 8.5 mg/dL (ref 8.5–10.1)
CHLORIDE: 106 mmol/L (ref 98–107)
CO2: 28 mmol/L (ref 21–32)
CREATININE: 1.2 mg/dL (ref 0.60–1.30)
EGFR (Non-African Amer.): 57 — ABNORMAL LOW
Glucose: 98 mg/dL (ref 65–99)
OSMOLALITY: 276 (ref 275–301)
POTASSIUM: 3.9 mmol/L (ref 3.5–5.1)
SODIUM: 137 mmol/L (ref 136–145)

## 2013-04-04 LAB — TROPONIN I
TROPONIN-I: 0.04 ng/mL
TROPONIN-I: 0.05 ng/mL

## 2013-04-04 LAB — APTT
Activated PTT: >160 secs (ref 23.6–35.9)
Activated PTT: 109.2 secs — ABNORMAL HIGH (ref 23.6–35.9)

## 2013-04-04 LAB — MAGNESIUM: Magnesium: 2 mg/dL

## 2013-04-05 ENCOUNTER — Encounter: Payer: Self-pay | Admitting: Cardiovascular Disease

## 2013-04-05 ENCOUNTER — Encounter (HOSPITAL_COMMUNITY): Payer: Self-pay | Admitting: Physician Assistant

## 2013-04-05 ENCOUNTER — Inpatient Hospital Stay (HOSPITAL_COMMUNITY)
Admission: AD | Admit: 2013-04-05 | Discharge: 2013-04-07 | DRG: 247 | Disposition: A | Discharge status: Home / Self Care | Payer: Medicare Other | Source: Other Acute Inpatient Hospital | Attending: Interventional Cardiology | Admitting: Interventional Cardiology

## 2013-04-05 DIAGNOSIS — Z7982 Long term (current) use of aspirin: Secondary | ICD-10-CM

## 2013-04-05 DIAGNOSIS — Z905 Acquired absence of kidney: Secondary | ICD-10-CM

## 2013-04-05 DIAGNOSIS — Z9861 Coronary angioplasty status: Secondary | ICD-10-CM

## 2013-04-05 DIAGNOSIS — Z85528 Personal history of other malignant neoplasm of kidney: Secondary | ICD-10-CM

## 2013-04-05 DIAGNOSIS — I10 Essential (primary) hypertension: Secondary | ICD-10-CM

## 2013-04-05 DIAGNOSIS — I209 Angina pectoris, unspecified: Secondary | ICD-10-CM | POA: Diagnosis present

## 2013-04-05 DIAGNOSIS — I2510 Atherosclerotic heart disease of native coronary artery without angina pectoris: Secondary | ICD-10-CM

## 2013-04-05 DIAGNOSIS — Z85820 Personal history of malignant melanoma of skin: Secondary | ICD-10-CM

## 2013-04-05 DIAGNOSIS — E785 Hyperlipidemia, unspecified: Secondary | ICD-10-CM

## 2013-04-05 DIAGNOSIS — Z87891 Personal history of nicotine dependence: Secondary | ICD-10-CM

## 2013-04-05 DIAGNOSIS — I252 Old myocardial infarction: Secondary | ICD-10-CM

## 2013-04-05 DIAGNOSIS — I200 Unstable angina: Secondary | ICD-10-CM

## 2013-04-05 DIAGNOSIS — N189 Chronic kidney disease, unspecified: Secondary | ICD-10-CM

## 2013-04-05 LAB — APTT: Activated PTT: 118.4 secs — ABNORMAL HIGH (ref 23.6–35.9)

## 2013-04-05 MED ORDER — AMLODIPINE BESYLATE 10 MG PO TABS
10.0000 mg | ORAL_TABLET | Freq: Every day | ORAL | Status: DC
  Administered 2013-04-05 – 2013-04-06 (×2): 10 mg via ORAL
  Filled 2013-04-05 (×4): qty 1

## 2013-04-05 MED ORDER — HEPARIN (PORCINE) IN NACL 100-0.45 UNIT/ML-% IJ SOLN
1100.0000 [IU]/h | INTRAMUSCULAR | Status: DC
  Administered 2013-04-05: 1100 [IU]/h via INTRAVENOUS
  Filled 2013-04-05 (×2): qty 250

## 2013-04-05 MED ORDER — ASPIRIN 81 MG PO CHEW
81.0000 mg | CHEWABLE_TABLET | Freq: Every day | ORAL | Status: DC
  Administered 2013-04-05 – 2013-04-06 (×2): 81 mg via ORAL
  Filled 2013-04-05 (×4): qty 1

## 2013-04-05 MED ORDER — ATENOLOL 12.5 MG HALF TABLET
12.5000 mg | ORAL_TABLET | Freq: Two times a day (BID) | ORAL | Status: DC
  Administered 2013-04-05 – 2013-04-06 (×3): 12.5 mg via ORAL
  Filled 2013-04-05 (×7): qty 1

## 2013-04-05 MED ORDER — EZETIMIBE 10 MG PO TABS
10.0000 mg | ORAL_TABLET | Freq: Every day | ORAL | Status: DC
  Administered 2013-04-05 – 2013-04-06 (×2): 10 mg via ORAL
  Filled 2013-04-05 (×4): qty 1

## 2013-04-05 MED ORDER — IRBESARTAN 300 MG PO TABS: 300.0000 mg | ORAL_TABLET | Freq: Every day | ORAL | Status: DC

## 2013-04-05 MED ORDER — SODIUM CHLORIDE 0.9 % IJ SOLN
3.0000 mL | Freq: Two times a day (BID) | INTRAMUSCULAR | Status: DC
  Administered 2013-04-05: 3 mL via INTRAVENOUS

## 2013-04-05 MED ORDER — FINASTERIDE 5 MG PO TABS
2.5000 mg | ORAL_TABLET | Freq: Every day | ORAL | Status: DC
  Administered 2013-04-05 – 2013-04-06 (×2): 2.5 mg via ORAL
  Filled 2013-04-05 (×4): qty 0.5

## 2013-04-05 MED ORDER — ONDANSETRON HCL 4 MG/2ML IJ SOLN: 4.0000 mg | Freq: Four times a day (QID) | INTRAMUSCULAR | Status: DC | PRN

## 2013-04-05 MED ORDER — ATORVASTATIN CALCIUM 20 MG PO TABS
20.0000 mg | ORAL_TABLET | Freq: Every day | ORAL | Status: DC
Start: 2013-04-05 — End: 2013-04-06
  Administered 2013-04-05: 20 mg via ORAL
  Filled 2013-04-05 (×2): qty 1

## 2013-04-05 MED ORDER — OMEGA-3-ACID ETHYL ESTERS 1 G PO CAPS
1.0000 g | ORAL_CAPSULE | Freq: Two times a day (BID) | ORAL | Status: DC
  Administered 2013-04-05 – 2013-04-06 (×3): 1 g via ORAL
  Filled 2013-04-05 (×7): qty 1

## 2013-04-05 MED ORDER — NITROGLYCERIN 0.4 MG SL SUBL
0.4000 mg | SUBLINGUAL_TABLET | SUBLINGUAL | Status: DC | PRN
  Administered 2013-04-05 (×2): 0.4 mg via SUBLINGUAL
  Filled 2013-04-05 (×2): qty 1

## 2013-04-05 MED ORDER — ACETAMINOPHEN 325 MG PO TABS: 650.0000 mg | ORAL_TABLET | ORAL | Status: DC | PRN

## 2013-04-05 MED ORDER — SODIUM CHLORIDE 0.9 % IJ SOLN: 3.0000 mL | INTRAMUSCULAR | Status: DC | PRN

## 2013-04-05 MED ORDER — IRBESARTAN 75 MG PO TABS
75.0000 mg | ORAL_TABLET | Freq: Every day | ORAL | Status: DC
  Administered 2013-04-05: 75 mg via ORAL
  Filled 2013-04-05 (×2): qty 1

## 2013-04-05 MED ORDER — SODIUM CHLORIDE 0.9 % IV SOLN: 250.0000 mL | INTRAVENOUS | Status: DC | PRN

## 2013-04-05 MED ORDER — NITROGLYCERIN IN D5W 200-5 MCG/ML-% IV SOLN
10.0000 ug/min | INTRAVENOUS | Status: DC
  Administered 2013-04-05: 10 ug/min via INTRAVENOUS
  Filled 2013-04-05: qty 250

## 2013-04-05 MED ORDER — SODIUM CHLORIDE 0.9 % IV SOLN
INTRAVENOUS | Status: DC
  Administered 2013-04-05: 15:00:00 via INTRAVENOUS

## 2013-04-05 MED ORDER — NITROGLYCERIN 2 % TD OINT
1.0000 [in_us] | TOPICAL_OINTMENT | Freq: Four times a day (QID) | TRANSDERMAL | Status: DC
  Filled 2013-04-05: qty 30

## 2013-04-05 MED ORDER — ZOLPIDEM TARTRATE 5 MG PO TABS: 5.0000 mg | ORAL_TABLET | Freq: Every evening | ORAL | Status: DC | PRN

## 2013-04-05 NOTE — H&P (Signed)
Primary MD: Marcello Fennel, MD Cardiologist: TK  Chief Complaint: Chest pain  HPI:  Nicholas Douglas is a 80 y.o. male with a history of CAD, hx PCI x 4 1990s, moderate disease by cath 2011, treated medically. He has had renal cell CA, s/p left nephrectomy, has HTN, HL. Followed by Dr. [name]. He went to [facility] 02/28 with chest pain, enzymes were negative, Lexiscan was moderate risk and he had a heart cath 03/02.   Results are summarized: proxLAD 70%, midLAD 99%, distLAD 60%, midCFX 80%, OM2 ostial 60%, OM3 ostial 60%, RCA mult 40% lesions, EF 60% at stress test.  Nicholas Douglas was transferred to [facility] for consideration of PCI vs CABG. Currently he is hemodynamically stable, but he is having 2/10 angina.  Review of Systems:     Cardiac Review of Systems: {Y] = yes [ ]  = no  Chest Pain [  y  ]  Resting SOB [   ] Exertional SOB  [  ]  Orthopnea [  ]   Pedal Edema [   ]    Palpitations [  ] Syncope  [  ]   Presyncope [   ]  General Review of Systems: [Y] = yes [  ]=no Constitional: recent weight change [  ]; anorexia [  ]; fatigue [  ]; nausea [  ]; night sweats [  ]; fever [  ]; or chills [  ];                                                                                                                                          Dental: poor dentition[  ];    Eye : blurred vision [  ]; diplopia [   ]; vision changes [  ];  Amaurosis fugax[  ]; Resp: cough [  ];  wheezing[  ];  hemoptysis[  ]; shortness of breath[  ]; paroxysmal nocturnal dyspnea[  ]; dyspnea on exertion[  ]; or orthopnea[  ];  GI:  gallstones[  ], vomiting[  ];  dysphagia[  ]; melena[  ];  hematochezia [  ]; heartburn[  ];   Hx of  Colonoscopy[  ]; GU: kidney stones [  ]; hematuria[  ];   dysuria [  ];  nocturia[  ];  history of     obstruction [  ];                 Skin: rash, swelling[  ];, hair loss[  ];  peripheral edema[  ];  or itching[  ]; Musculosketetal: myalgias[  ];  joint swelling[  ];  joint erythema[  ];  joint pain[   ];  back pain[  ];  Heme/Lymph: bruising[  ];  bleeding[  ];  anemia[  ];  Neuro: TIA[  ];  headaches[  ];  stroke[  ];  vertigo[  ];  seizures[  ];   paresthesias[  ];  difficulty walking[  ];  Psych:depression[  ]; anxiety[  ];  Endocrine: diabetes[  ];  thyroid dysfunction[  ];  Immunizations: Flu [  ]; Pneumococcal[  ];  Other:  Past Medical History  Diagnosis Date  . Myocardial infarction   . Hypertension   . Personal history of colonic polyps   . Unspecified congenital cystic kidney disease   . Other abnormal blood chemistry   . Other nonspecific abnormal serum enzyme levels   . Malignant neoplasm of kidney, except pelvis   . Calculus of kidney   . Coronary atherosclerosis of unspecified type of vessel, native or graft   . Impotence of organic origin   . Tobacco use disorder   . Pure hypercholesterolemia   . Internal hemorrhoids without mention of complication   . Melanoma 06/2010    Left arm   Past Surgical History  Procedure Laterality Date  . Kidney surgery    . Cardiac catheterization    . Angioplasty    . Melanoma excision    . Colonoscopy  10/05/2008    single polyp, IH.  [name].  Repeat in 3 years.  . Carotid dopplers  03/08/2011    minimal plaque formation B. Symptoms: dizziness.  . Cardiolite  05/06/2011    low risk study; normal EF.  SE H&V.    Medications Prior to Admission  Medication Sig Dispense Refill  . amLODipine (NORVASC) 10 MG tablet TAKE 1 TABLET DAILY  90 tablet  3  . aspirin 81 MG tablet Take 81 mg by mouth daily.      Nicholas Douglas atenolol (TENORMIN) 25 MG tablet Take 0.5 tablets (12.5 mg total) by mouth 2 (two) times daily.  90 tablet  3  . finasteride (PROSCAR) 5 MG tablet Take 0.5 tablets by mouth daily.      Nicholas Douglas ibuprofen (ADVIL,MOTRIN) 200 MG tablet Take 200 mg by mouth every 6 (six) hours as needed.      . nitroGLYCERIN (NITROSTAT) 0.4 MG SL tablet Place 0.4 mg under the tongue every 5 (five) minutes as needed.      Nicholas Douglas omega-3 acid ethyl esters  (LOVAZA) 1 G capsule Take 1 g by mouth 2 (two) times daily.      . Pitavastatin Calcium 4 MG TABS Take by mouth daily. Take 1/2 tablet daily      . valsartan (DIOVAN) 160 MG tablet Take 160 mg by mouth daily. Take 1/2 tablet daily      . ZETIA 10 MG tablet TAKE 1 TABLET DAILY  90 tablet  3   Allergies  Allergen Reactions  . Morphine And Related Hives    History   Social History  . Marital Status: Married    Spouse Name: N/A    Number of Children: 3  . Years of Education: college   Occupational History  . Retired     Personal assistant   Social History Main Topics  . Smoking status: Former Smoker    Types: Pipe, Landscape architect  . Smokeless tobacco: Former Systems developer    Types: Chew     Comment: quit chewing 3 days ago.  . Alcohol Use: Yes     Comment: mininmal one glass of wine twice weekly  . Drug Use: No  . Sexual Activity: Yes   Other Topics Concern  . Not on file   Social History Narrative   Married x 23 years 2nd marriage; 3 children, 2 step-children and 10 grandchildren.  Lives with  wife.   Exercise: 5 x week Light; plays golf 5 days per week but rides golf cart, walking 1 mile.   Patient DOES not have living will; desires DNR/DNI.  No tobacco.  No alcohol.   No drugs.  ADLS: independent with all ADLs; drives.  No falls.  Does not use assistant devices with ambulation.      Family History  Problem Relation Age of Onset  . Heart disease Mother     CAD, AAA.  Nicholas Douglas AAA (abdominal aortic aneurysm) Mother    Family Status  Relation Status Death Age  . Mother Deceased     surgical complications  . Father Deceased     No known CAD    PHYSICAL EXAM: Filed Vitals:   04/05/13 1152  BP: 153/56  Pulse: 59  Temp: 97.4 F (36.3 C)  Resp: 19   General:  Well appearing. No respiratory difficulty HEENT: normal Neck: supple. no JVD. Carotids 2+ bilat; no bruits. No lymphadenopathy or thryomegaly appreciated. Cor: PMI nondisplaced. Regular rate & rhythm. No rubs, gallops or  murmurs. Lungs: clear Abdomen: soft, nontender, nondistended. No hepatosplenomegaly. No bruits or masses. Good bowel sounds. Extremities: no cyanosis, clubbing, rash, no edema; right radial cath site without ecchymosis/hematoma Neuro: alert & oriented x 3, cranial nerves grossly intact. moves all 4 extremities w/o difficulty. Affect pleasant.  CXR: 02/28 at [facility] Hypoexpansion, mild bibas ATX  LAB: done at [facility], see paper chart ECG: pending   ASSESSMENT: 1. USAP  2. Hx CAD/PCI 3. HTN 4. HL 5. Tob use 6. S/p left nephrectomy  PLAN/DISCUSSION: 80 yo male with above history admitted to [facility] w/ USAP, s/p cath, results above, transferred for PCI vs CABG.  Currently having mild angina. Will start heparin and add NTG paste, continue ASA/BB/statin.     Patient seen and examined. Agree with assessment and plan. Pt is well known to me. See my last office note of 03/01/13. He is s/p prior PCI in 1989, 1991, 1997, and last cath was in 2011. He has developed chest tightness leading to his [facility] evaluation and cath today at [facility] reveals coronary calcification with high grade focal mid LAD stenosis of >95% and 80% LXS focal stenosis. I have reviewed cines. Discussed possible PCI with HSRA to LAD and possible staged  PCI to Lcx. He has only 1 kidney and timing will be dependent on renal function. Will hydrate with NS at 100 cc/hr. Start heparin and with chest  earlier begin IV NTG. Will review angio with colleagues in am.   [name], MD, [facility] 04/05/2013 2:57 PM

## 2013-04-05 NOTE — Progress Notes (Signed)
Nursing note Patient experiencing chest pain, mid, states its "just hurts". One SL Nitro given as ordered for patient patient rating pain 3/10. bp 151/81 and 143/91 respectively. Patient states pain is subsiding will continue to monitor patient. Harriette Tovey, Bettina Gavia RN

## 2013-04-05 NOTE — Progress Notes (Signed)
ANTICOAGULATION CONSULT NOTE - Initial Consult  Pharmacy Consult for Heparin Indication: chest pain/ACS  Allergies  Allergen Reactions  . Morphine And Related Hives    Patient Measurements: Height: 5\' 7"  (170.2 cm) Weight: 193 lb 5.5 oz (87.7 kg) IBW/kg (Calculated) : 66.1 Heparin Dosing Weight: 84 kg  Vital Signs: Temp: 97.6 F (36.4 C) (03/02 1418) Temp src: Oral (03/02 1418) BP: 167/80 mmHg (03/02 1418) Pulse Rate: 59 (03/02 1418)  Labs: No results found for this basename: HGB, HCT, PLT, APTT, LABPROT, INR, HEPARINUNFRC, CREATININE, CKTOTAL, CKMB, TROPONINI,  in the last 72 hours  Estimated Creatinine Clearance: 53.7 ml/min (by C-G formula based on Cr of 1.16).   Medical History: Past Medical History  Diagnosis Date  . Myocardial infarction   . Hypertension   . Personal history of colonic polyps   . Unspecified congenital cystic kidney disease   . Other abnormal blood chemistry   . Other nonspecific abnormal serum enzyme levels   . Malignant neoplasm of kidney, except pelvis   . Calculus of kidney   . Coronary atherosclerosis of unspecified type of vessel, native or graft   . Impotence of organic origin   . Tobacco use disorder   . Pure hypercholesterolemia   . Internal hemorrhoids without mention of complication   . Melanoma 06/2010    Left arm    Medications:  Prescriptions prior to admission  Medication Sig Dispense Refill  . amLODipine (NORVASC) 10 MG tablet TAKE 1 TABLET DAILY  90 tablet  3  . aspirin 81 MG tablet Take 81 mg by mouth daily.      Marland Kitchen atenolol (TENORMIN) 25 MG tablet Take 0.5 tablets (12.5 mg total) by mouth 2 (two) times daily.  90 tablet  3  . finasteride (PROSCAR) 5 MG tablet Take 0.5 tablets by mouth daily.      Marland Kitchen ibuprofen (ADVIL,MOTRIN) 200 MG tablet Take 200 mg by mouth every 6 (six) hours as needed.      . nitroGLYCERIN (NITROSTAT) 0.4 MG SL tablet Place 0.4 mg under the tongue every 5 (five) minutes as needed.      Marland Kitchen omega-3 acid  ethyl esters (LOVAZA) 1 G capsule Take 1 g by mouth 2 (two) times daily.      . Pitavastatin Calcium 4 MG TABS Take by mouth daily. Take 1/2 tablet daily      . valsartan (DIOVAN) 160 MG tablet Take 160 mg by mouth daily. Take 1/2 tablet daily      . ZETIA 10 MG tablet TAKE 1 TABLET DAILY  90 tablet  3    Assessment: 80 y.o. male presented to [facility] with chest pain - s/p cath 3/2 which showed proxLAD 70%, midLAD 99%, distLAD 60%, midCFX 80%, OM2 ostial 60%, OM3 ostial 60%, RCA mult 40% lesions. He has been transferred to [facility] for PCI vs CABG. To begin heparin. Per report, sheath pulled ~0930 at [facility]. Labs from [facility] 3/2: SCr 1.2, BUN 18, WBC 6, Hgb 14.6, Hct 43.8, plt 202  Goal of Therapy:  Heparin level 0.3-0.7 units/ml Monitor platelets by anticoagulation protocol: Yes   Plan:  1. Begin heparin at 1100 units/hr at 1530 (~6 hr post sheath pull). No bolus. 2. Will f/u 8 hr heparin level 3. Daily heparin level and CBC  Sherlon Handing, PharmD, BCPS Clinical pharmacist, pager [phone] 04/05/2013,2:47 PM

## 2013-04-06 ENCOUNTER — Ambulatory Visit (HOSPITAL_COMMUNITY): Admit: 2013-04-06 | Payer: Self-pay | Admitting: Interventional Cardiology

## 2013-04-06 ENCOUNTER — Encounter (HOSPITAL_COMMUNITY)
Admission: AD | Disposition: A | Discharge status: Home / Self Care | Payer: Self-pay | Source: Other Acute Inpatient Hospital | Attending: Interventional Cardiology

## 2013-04-06 DIAGNOSIS — I2510 Atherosclerotic heart disease of native coronary artery without angina pectoris: Secondary | ICD-10-CM

## 2013-04-06 DIAGNOSIS — I214 Non-ST elevation (NSTEMI) myocardial infarction: Secondary | ICD-10-CM

## 2013-04-06 HISTORY — PX: PERCUTANEOUS CORONARY ROTOBLATOR INTERVENTION (PCI-R): SHX5484

## 2013-04-06 LAB — PROTIME-INR
INR: 1.48 (ref 0.00–1.49)
Prothrombin Time: 17.5 seconds — ABNORMAL HIGH (ref 11.6–15.2)

## 2013-04-06 LAB — LIPID PANEL
Cholesterol: 139 mg/dL (ref 0–200)
HDL: 45 mg/dL (ref >39)
LDL CALC: 64 mg/dL (ref 0–99)
TRIGLYCERIDES: 150 mg/dL — AB (ref <150)
Total CHOL/HDL Ratio: 3.1 RATIO
VLDL: 30 mg/dL (ref 0–40)

## 2013-04-06 LAB — POCT ACTIVATED CLOTTING TIME: ACTIVATED CLOTTING TIME: 747 s

## 2013-04-06 LAB — COMPREHENSIVE METABOLIC PANEL
ALBUMIN: 3 g/dL — AB (ref 3.5–5.2)
ALK PHOS: 46 U/L (ref 39–117)
ALT: 59 U/L — ABNORMAL HIGH (ref 0–53)
AST: 57 U/L — ABNORMAL HIGH (ref 0–37)
BILIRUBIN TOTAL: 0.6 mg/dL (ref 0.3–1.2)
BUN: 18 mg/dL (ref 6–23)
CHLORIDE: 106 meq/L (ref 96–112)
CO2: 25 mEq/L (ref 19–32)
Calcium: 8.6 mg/dL (ref 8.4–10.5)
Creatinine, Ser: 1.22 mg/dL (ref 0.50–1.35)
GFR calc Af Amer: 63 mL/min — ABNORMAL LOW (ref >90)
GFR calc non Af Amer: 54 mL/min — ABNORMAL LOW (ref >90)
GLUCOSE: 106 mg/dL — AB (ref 70–99)
POTASSIUM: 3.9 meq/L (ref 3.7–5.3)
Sodium: 141 mEq/L (ref 137–147)
Total Protein: 6.4 g/dL (ref 6.0–8.3)

## 2013-04-06 LAB — HEPARIN LEVEL (UNFRACTIONATED)
HEPARIN UNFRACTIONATED: 0.31 [IU]/mL (ref 0.30–0.70)
Heparin Unfractionated: 0.42 IU/mL (ref 0.30–0.70)

## 2013-04-06 LAB — OCCULT BLOOD X 1 CARD TO LAB, STOOL: Fecal Occult Bld: NEGATIVE

## 2013-04-06 LAB — CBC
HEMATOCRIT: 42.5 % (ref 39.0–52.0)
Hemoglobin: 14.2 g/dL (ref 13.0–17.0)
MCH: 29.2 pg (ref 26.0–34.0)
MCHC: 33.4 g/dL (ref 30.0–36.0)
MCV: 87.3 fL (ref 78.0–100.0)
Platelets: 170 10*3/uL (ref 150–400)
RBC: 4.87 MIL/uL (ref 4.22–5.81)
RDW: 12.8 % (ref 11.5–15.5)
WBC: 6 10*3/uL (ref 4.0–10.5)

## 2013-04-06 SURGERY — PERCUTANEOUS CORONARY ROTOBLATOR INTERVENTION (PCI-R)
Anesthesia: LOCAL

## 2013-04-06 MED ORDER — HEPARIN SODIUM (PORCINE) 1000 UNIT/ML IJ SOLN
INTRAMUSCULAR | Status: AC
  Filled 2013-04-06: qty 1

## 2013-04-06 MED ORDER — DIAZEPAM 5 MG PO TABS
5.0000 mg | ORAL_TABLET | ORAL | Status: AC
  Administered 2013-04-06: 5 mg via ORAL
  Filled 2013-04-06: qty 1

## 2013-04-06 MED ORDER — MIDAZOLAM HCL 2 MG/2ML IJ SOLN
INTRAMUSCULAR | Status: AC
  Filled 2013-04-06: qty 2

## 2013-04-06 MED ORDER — ACTIVE PARTNERSHIP FOR HEALTH OF YOUR HEART BOOK
Freq: Once | Status: AC
  Administered 2013-04-06: 22:00:00
  Filled 2013-04-06: qty 1

## 2013-04-06 MED ORDER — LIDOCAINE HCL (PF) 1 % IJ SOLN
INTRAMUSCULAR | Status: AC
  Filled 2013-04-06: qty 30

## 2013-04-06 MED ORDER — TICAGRELOR 90 MG PO TABS
ORAL_TABLET | ORAL | Status: AC
  Filled 2013-04-06: qty 1

## 2013-04-06 MED ORDER — VERAPAMIL HCL 2.5 MG/ML IV SOLN
INTRAVENOUS | Status: AC
  Filled 2013-04-06: qty 2

## 2013-04-06 MED ORDER — SODIUM CHLORIDE 0.9 % IV SOLN
1.0000 mL/kg/h | INTRAVENOUS | Status: AC
  Administered 2013-04-06: 1 mL/kg/h via INTRAVENOUS

## 2013-04-06 MED ORDER — ASPIRIN 81 MG PO CHEW
324.0000 mg | CHEWABLE_TABLET | ORAL | Status: AC
  Administered 2013-04-06: 324 mg via ORAL
  Filled 2013-04-06: qty 4

## 2013-04-06 MED ORDER — SODIUM CHLORIDE 0.9 % IJ SOLN: 3.0000 mL | Freq: Two times a day (BID) | INTRAMUSCULAR | Status: DC

## 2013-04-06 MED ORDER — TICAGRELOR 90 MG PO TABS
90.0000 mg | ORAL_TABLET | Freq: Two times a day (BID) | ORAL | Status: DC
  Administered 2013-04-06: 21:00:00 90 mg via ORAL
  Filled 2013-04-06 (×3): qty 1

## 2013-04-06 MED ORDER — HEPARIN (PORCINE) IN NACL 2-0.9 UNIT/ML-% IJ SOLN
INTRAMUSCULAR | Status: AC
  Filled 2013-04-06: qty 1000

## 2013-04-06 MED ORDER — FENTANYL CITRATE 0.05 MG/ML IJ SOLN
INTRAMUSCULAR | Status: AC
  Filled 2013-04-06: qty 2

## 2013-04-06 MED ORDER — SODIUM CHLORIDE 0.9 % IV SOLN: 250.0000 mL | INTRAVENOUS | Status: DC | PRN

## 2013-04-06 MED ORDER — SODIUM CHLORIDE 0.9 % IJ SOLN: 3.0000 mL | INTRAMUSCULAR | Status: DC | PRN

## 2013-04-06 MED ORDER — NITROGLYCERIN 0.2 MG/ML ON CALL CATH LAB
INTRAVENOUS | Status: AC
  Filled 2013-04-06: qty 1

## 2013-04-06 MED ORDER — HYDRALAZINE HCL 20 MG/ML IJ SOLN
10.0000 mg | Freq: Four times a day (QID) | INTRAMUSCULAR | Status: DC | PRN
  Administered 2013-04-06: 10 mg via INTRAVENOUS
  Filled 2013-04-06: qty 1

## 2013-04-06 MED ORDER — SODIUM CHLORIDE 0.9 % IV SOLN: 1.0000 mL/kg/h | INTRAVENOUS | Status: DC

## 2013-04-06 MED ORDER — ATORVASTATIN CALCIUM 10 MG PO TABS
10.0000 mg | ORAL_TABLET | Freq: Every day | ORAL | Status: DC
  Filled 2013-04-06 (×3): qty 1

## 2013-04-06 NOTE — Progress Notes (Signed)
   Subjective:  No recurrent chest pain since on heparin and IV NTG  Objective:   Vital Signs in the last 24 hours: Temp:  [97.4 F (36.3 C)-98 F (36.7 C)] 97.6 F (36.4 C) (03/03 0644) Pulse Rate:  [52-79] 52 (03/03 0644) Resp:  [18-19] 18 (03/03 0644) BP: (131-188)/(56-91) 131/76 mmHg (03/03 0644) SpO2:  [94 %-97 %] 97 % (03/03 0644) Weight:  [193 lb 5.5 oz (87.7 kg)-196 lb 6.9 oz (89.1 kg)] 196 lb 6.9 oz (89.1 kg) (03/03 0644)  Intake/Output from previous day: 03/02 0701 - 03/03 0700 In: 127.8 [I.V.:127.8] Out: -   Medications: . amLODipine  10 mg Oral Daily  . [START ON 04/07/2013] aspirin  324 mg Oral Pre-Cath  . aspirin  81 mg Oral Daily  . atenolol  12.5 mg Oral BID  . atorvastatin  20 mg Oral q1800  . diazepam  5 mg Oral On Call  . ezetimibe  10 mg Oral Daily  . finasteride  2.5 mg Oral Daily  . irbesartan  75 mg Oral Daily  . omega-3 acid ethyl esters  1 g Oral BID  . sodium chloride  3 mL Intravenous Q12H  . sodium chloride  3 mL Intravenous Q12H    . sodium chloride 100 mL/hr at 04/05/13 1500  . [START ON 04/07/2013] sodium chloride    . heparin 1,100 Units/hr (04/05/13 1600)  . nitroGLYCERIN 10 mcg/min (04/05/13 1615)    Physical Exam:   General appearance: alert, cooperative and no distress Neck: no adenopathy, no JVD, supple, symmetrical, trachea midline and thyroid not enlarged, symmetric, no tenderness/mass/nodules Lungs: slightly decreased BS at bases; no wheezing Heart: regular rate and rhythm Abdomen: soft, non-tender; bowel sounds normal; no masses,  no organomegaly Extremities: extremities normal, atraumatic, no cyanosis or edema Pulses: 2+ and symmetric Skin: Skin color, texture, turgor normal. No rashes or lesions Neuro: nonfocal   Rate: 56  Rhythm: sinus bradycardia  Lab Results:    Recent Labs  04/06/13 0334  NA 141  K 3.9  CL 106  CO2 25  GLUCOSE 106*  BUN 18  CREATININE 1.22   No results found for this basename:  TROPONINI, CK, MB,  in the last 72 hours Hepatic Function Panel  Recent Labs  04/06/13 0334  PROT 6.4  ALBUMIN 3.0*  AST 57*  ALT 59*  ALKPHOS 46  BILITOT 0.6    Recent Labs  04/06/13 0334  INR 1.48   BNP (last 3 results) No results found for this basename: PROBNP,  in the last 8760 hours  Lipid Panel     Component Value Date/Time   CHOL 139 04/06/2013 0334   TRIG 150* 04/06/2013 0334   HDL 45 04/06/2013 0334   CHOLHDL 3.1 04/06/2013 0334   VLDL 30 04/06/2013 0334   LDLCALC 64 04/06/2013 0334      Imaging:  No results found.    Assessment/Plan:   Active Problems:   Unstable angina s/p Nephrectomy HTN hyperlipidemia  I have reviewed angios and discussed with Dr. Martin[place]. Plan for HSRA/stenting of LAD today. With 1 remaining kidney probably will need staged intervention at a later date to [place]. Will hold irbesartan today in anticipation of contrast. Mild increase in LFT's, will need to monitor and will decrease atorvastatin dose. Discussed risks/benefits with patient and family.    [name], MD, [facility] 04/06/2013, 8:15 AM

## 2013-04-06 NOTE — Progress Notes (Signed)
TR BAND REMOVAL  LOCATION:  right radial  DEFLATED PER PROTOCOL:  yes  TIME BAND OFF / DRESSING APPLIED:   1545   SITE UPON ARRIVAL:   Level 0  SITE AFTER BAND REMOVAL:  Level 0  REVERSE ALLEN'S TEST:    positive  CIRCULATION SENSATION AND MOVEMENT:  Within Normal Limits  yes  COMMENTS:

## 2013-04-06 NOTE — Interval H&P Note (Signed)
History and Physical Interval Note:  04/06/2013 9:21 AM  Nicholas Douglas  has presented today for surgery, with the diagnosis of PCI LAD CHest pain   The various methods of treatment have been discussed with the patient and family. After consideration of risks, benefits and other options for treatment, the patient has consented to  Procedure(s): PERCUTANEOUS CORONARY ROTOBLATOR INTERVENTION (PCI-R) (N/A) as a surgical intervention .  The patient's history has been reviewed, patient examined, no change in status, stable for surgery.  I have reviewed the patient's chart and labs.  Questions were answered to the patient's satisfaction.    Cath Lab Visit (complete for each Cath Lab visit)  Clinical Evaluation Leading to the Procedure:   ACS: yes  Non-ACS:    Anginal Classification: CCS IV  Anti-ischemic medical therapy: Maximal Therapy (2 or more classes of medications)  Non-Invasive Test Results: Intermediate-risk stress test findings: cardiac mortality 1-3%/year  Prior CABG: No previous CABG       [name] [facility] 04/06/2013 9:22 AM

## 2013-04-06 NOTE — CV Procedure (Signed)
    CARDIAC CATH NOTE  Name: Nicholas Douglas MRN: [id number] DOB: September 05, 1931  Procedure: PTCA with rotational atherectomy and stenting of the proximal to mid LAD  Indication: 80 yo WM with history of CAD s/p multiple remote PTCA procedures presents with a NSTEMI. Class IV angina. Diagnostic angiography demonstrates severe 2 vessel CAD with culprit 95% mid LAD. The LAD was heavily calcified throughout the proximal to mid vessel. There was also a 90% lesion of the mid LCx. PCI was recommended. Staging of the LCx was recommended to reduce risk of contrast induced nephropathy.  Procedural Details: The right wrist was prepped, draped, and anesthetized with 1% lidocaine. Using the modified Seldinger technique, a 6 Fr sheath was introduced into the radial artery. 3 mg verapamil was administered through the radial sheath. Weight-based heparin was given for anticoagulation. Brilinta 180 mg was given orally. Once a therapeutic ACT was achieved, a 6 [place] XBLAD guide catheter was inserted.  A rotofloppy coronary guidewire was used to cross the lesion.  Rotational atherectomy was then performed on the proximal to mid LAD using a 1.5 mm Burr. We then dilated the most severe segment with a 2.5 mm cutting balloon.  The mid LAD was then stented with a 2.75 x 38 mm Promus stent.  The stent was postdilated with a 3.0 mm noncompliant balloon. The proximal LAD was then stented using a 3.0 x 28 mm Promus stent in overlapping fashion. This was postdilated with a 3.25 mm noncompliant balloon. Following PCI, there was 0% residual stenosis and TIMI-3 flow. Final angiography confirmed an excellent result. The patient tolerated the procedure well. There were no immediate procedural complications. A TR band was used for radial hemostasis. The patient was transferred to the post catheterization recovery area for further monitoring.  Lesion Data: Vessel: LAD Percent stenosis (pre): 95% TIMI-flow (pre):  3 Stent:  2.75 x 38 mm and  3.0 x 28 mm Promus stents Percent stenosis (post): 0% TIMI-flow (post): 3  Conclusions: Successful rotational atherectomy and stenting of the proximal to mid LAD.  Recommendations: Dual antiplatelet therapy for at least one year. Stage PCI of LCx in a couple of weeks.   [name] [facility] 04/06/2013, 10:41 AM

## 2013-04-06 NOTE — Progress Notes (Signed)
Pt. c/o moderate SOB.  Lungs CTA, Oxygen sat 98%  on RA.  Possibly secondary to Brilinta?  Have given coffee with caffeine  at this time.

## 2013-04-06 NOTE — H&P (View-Only) (Signed)
   Subjective:  No recurrent chest pain since on heparin and IV NTG  Objective:   Vital Signs in the last 24 hours: Temp:  [97.4 F (36.3 C)-98 F (36.7 C)] 97.6 F (36.4 C) (03/03 0644) Pulse Rate:  [52-79] 52 (03/03 0644) Resp:  [18-19] 18 (03/03 0644) BP: (131-188)/(56-91) 131/76 mmHg (03/03 0644) SpO2:  [94 %-97 %] 97 % (03/03 0644) Weight:  [193 lb 5.5 oz (87.7 kg)-196 lb 6.9 oz (89.1 kg)] 196 lb 6.9 oz (89.1 kg) (03/03 0644)  Intake/Output from previous day: 03/02 0701 - 03/03 0700 In: 127.8 [I.V.:127.8] Out: -   Medications: . amLODipine  10 mg Oral Daily  . [START ON 04/07/2013] aspirin  324 mg Oral Pre-Cath  . aspirin  81 mg Oral Daily  . atenolol  12.5 mg Oral BID  . atorvastatin  20 mg Oral q1800  . diazepam  5 mg Oral On Call  . ezetimibe  10 mg Oral Daily  . finasteride  2.5 mg Oral Daily  . irbesartan  75 mg Oral Daily  . omega-3 acid ethyl esters  1 g Oral BID  . sodium chloride  3 mL Intravenous Q12H  . sodium chloride  3 mL Intravenous Q12H    . sodium chloride 100 mL/hr at 04/05/13 1500  . [START ON 04/07/2013] sodium chloride    . heparin 1,100 Units/hr (04/05/13 1600)  . nitroGLYCERIN 10 mcg/min (04/05/13 1615)    Physical Exam:   General appearance: alert, cooperative and no distress Neck: no adenopathy, no JVD, supple, symmetrical, trachea midline and thyroid not enlarged, symmetric, no tenderness/mass/nodules Lungs: slightly decreased BS at bases; no wheezing Heart: regular rate and rhythm Abdomen: soft, non-tender; bowel sounds normal; no masses,  no organomegaly Extremities: extremities normal, atraumatic, no cyanosis or edema Pulses: 2+ and symmetric Skin: Skin color, texture, turgor normal. No rashes or lesions Neuro: nonfocal   Rate: 56  Rhythm: sinus bradycardia  Lab Results:    Recent Labs  04/06/13 0334  NA 141  K 3.9  CL 106  CO2 25  GLUCOSE 106*  BUN 18  CREATININE 1.22   No results found for this basename:  TROPONINI, CK, MB,  in the last 72 hours Hepatic Function Panel  Recent Labs  04/06/13 0334  PROT 6.4  ALBUMIN 3.0*  AST 57*  ALT 59*  ALKPHOS 46  BILITOT 0.6    Recent Labs  04/06/13 0334  INR 1.48   BNP (last 3 results) No results found for this basename: PROBNP,  in the last 8760 hours  Lipid Panel     Component Value Date/Time   CHOL 139 04/06/2013 0334   TRIG 150* 04/06/2013 0334   HDL 45 04/06/2013 0334   CHOLHDL 3.1 04/06/2013 0334   VLDL 30 04/06/2013 0334   LDLCALC 64 04/06/2013 0334      Imaging:  No results found.    Assessment/Plan:   Active Problems:   Unstable angina s/p Nephrectomy HTN hyperlipidemia  I have reviewed angios and discussed with Dr. Jordan. Plan for HSRA/stenting of LAD today. With 1 remaining kidney probably will need staged intervention at a later date to Lcx. Will hold irbesartan today in anticipation of contrast. Mild increase in LFT's, will need to monitor and will decrease atorvastatin dose. Discussed risks/benefits with patient and family.    Thomas A. Kelly, MD, FACC 04/06/2013, 8:15 AM 

## 2013-04-06 NOTE — Care Management Note (Addendum)
    Page 1 of 1   04/06/2013     4:18:16 PM   CARE MANAGEMENT NOTE 04/06/2013  Patient:  Nicholas Douglas, Nicholas Douglas   Account Number:  [id number]  Date Initiated:  04/06/2013  Documentation initiated by:  HUTCHINSON,CRYSTAL  Subjective/Objective Assessment:     Action/Plan:   CM to follow for disposition needs   Anticipated DC Date:  04/08/2013   Anticipated DC Plan:  HOME/SELF CARE         Choice offered to / List presented to:             Status of service:  In process, will continue to follow Medicare Important Message given?   (If response is "NO", the following Medicare IM given date fields will be blank) Date Medicare IM given:   Date Additional Medicare IM given:    Discharge Disposition:    Per UR Regulation:  Reviewed for med. necessity/level of care/duration of stay  If discussed at [place] of Stay Meetings, dates discussed:    Comments:  ---04/06/2013 1609 by Mariann Laster--- Benefits check request sent for co-pay and any authorizations for Brilinta 90mg  po bid #60. Mariann Laster, RN, BSN, [place] [phone]  04/06/2013 Handoff received from from [place] 04/06/2013 1548 ([place]/ CABG consult/ inpt documented)

## 2013-04-06 NOTE — Progress Notes (Signed)
ANTICOAGULATION CONSULT NOTE - Follow Up Consult  Pharmacy Consult for heparin Indication: CAD awaiting PCI vs CABG  Labs:  Recent Labs  04/06/13 0013  HEPARINUNFRC 0.31    Assessment/Plan:  80yo male therapeutic on heparin with initial dosing for CAD.  Will continue gtt at current rate and confirm stable with am labs.  Wynona Neat, PharmD, BCPS  04/06/2013,1:02 AM

## 2013-04-07 LAB — BASIC METABOLIC PANEL
BUN: 16 mg/dL (ref 6–23)
CHLORIDE: 104 meq/L (ref 96–112)
CO2: 22 mEq/L (ref 19–32)
Calcium: 9 mg/dL (ref 8.4–10.5)
Creatinine, Ser: 1.02 mg/dL (ref 0.50–1.35)
GFR calc Af Amer: 78 mL/min — ABNORMAL LOW (ref >90)
GFR calc non Af Amer: 67 mL/min — ABNORMAL LOW (ref >90)
Glucose, Bld: 92 mg/dL (ref 70–99)
POTASSIUM: 4 meq/L (ref 3.7–5.3)
Sodium: 139 mEq/L (ref 137–147)

## 2013-04-07 LAB — CBC
HCT: 42.5 % (ref 39.0–52.0)
HEMOGLOBIN: 14.6 g/dL (ref 13.0–17.0)
MCH: 29.5 pg (ref 26.0–34.0)
MCHC: 34.4 g/dL (ref 30.0–36.0)
MCV: 85.9 fL (ref 78.0–100.0)
PLATELETS: 195 10*3/uL (ref 150–400)
RBC: 4.95 MIL/uL (ref 4.22–5.81)
RDW: 12.8 % (ref 11.5–15.5)
WBC: 7.4 10*3/uL (ref 4.0–10.5)

## 2013-04-07 MED ORDER — CLOPIDOGREL BISULFATE 75 MG PO TABS: 75.0000 mg | ORAL_TABLET | Freq: Every day | ORAL | Status: DC

## 2013-04-07 MED ORDER — CLOPIDOGREL BISULFATE 75 MG PO TABS
300.0000 mg | ORAL_TABLET | Freq: Once | ORAL | Status: AC
  Administered 2013-04-07: 300 mg via ORAL
  Filled 2013-04-07: qty 4

## 2013-04-07 MED ORDER — NITROGLYCERIN 0.4 MG SL SUBL
0.4000 mg | SUBLINGUAL_TABLET | SUBLINGUAL | Status: DC | PRN
Start: 2013-04-07 — End: 2014-08-04

## 2013-04-07 NOTE — Progress Notes (Signed)
Patient Name: Nicholas Douglas Date of Encounter: 04/07/2013  Active Problems:   Unstable angina    SUBJECTIVE: No chest pain, had significant SOB w/ Brilinta last pm  OBJECTIVE Filed Vitals:   04/06/13 2006 04/06/13 2320 04/07/13 0415 04/07/13 0500  BP: 156/79 166/68 167/71   Pulse: 63 59 59   Temp: 97.7 F (36.5 C) 97.7 F (36.5 C) 98 F (36.7 C)   TempSrc: Oral Oral Oral   Resp: 17 18 17    Height:      Weight:  197 lb 12 oz (89.7 kg)  197 lb 12 oz (89.7 kg)  SpO2: 97% 95% 96%     Intake/Output Summary (Last 24 hours) at 04/07/13 0651 Last data filed at 04/07/13 0418  Gross per 24 hour  Intake 2155.5 ml  Output   2550 ml  Net -394.5 ml   Filed Weights   04/06/13 0644 04/06/13 2320 04/07/13 0500  Weight: 196 lb 6.9 oz (89.1 kg) 197 lb 12 oz (89.7 kg) 197 lb 12 oz (89.7 kg)    PHYSICAL EXAM General: Well developed, well nourished, male in no acute distress. Head: Normocephalic, atraumatic.  Neck: Supple without bruits, JVD not elevated Lungs:  Resp regular and unlabored, CTA. Heart: RRR, S1, S2, no S3, S4, or murmur; no rub. Abdomen: Soft, non-tender, non-distended, BS + x 4.  Extremities: No clubbing, cyanosis, no edema. Cath site w/ minimal ecchymosis, no hematoma Neuro: Alert and oriented X 3. Moves all extremities spontaneously. Psych: Normal affect.  LABS: CBC: Recent Labs  04/06/13 0334 04/07/13 0530  WBC 6.0 7.4  HGB 14.2 14.6  HCT 42.5 42.5  MCV 87.3 85.9  PLT 170 195   INR: Recent Labs  04/06/13 0334  INR 9.60   Basic Metabolic Panel: Recent Labs  04/06/13 0334 04/07/13 0530  NA 141 139  K 3.9 4.0  CL 106 104  CO2 25 22  GLUCOSE 106* 92  BUN 18 16  CREATININE 1.22 1.02  CALCIUM 8.6 9.0   Liver Function Tests: Recent Labs  04/06/13 0334  AST 57*  ALT 59*  ALKPHOS 46  BILITOT 0.6  PROT 6.4  ALBUMIN 3.0*   Fasting Lipid Panel: Recent Labs  04/06/13 0334  CHOL 139  HDL 45  LDLCALC 64  TRIG 150*  CHOLHDL 3.1    TELE:  SR, occ PVCs     ECG: Sinus brady Vent. rate 59 BPM PR interval 180 ms QRS duration 86 ms QT/QTc 464/459 ms P-R-T axes 16 42 110  Current Medications:  . amLODipine  10 mg Oral Daily  . aspirin  81 mg Oral Daily  . atenolol  12.5 mg Oral BID  . atorvastatin  10 mg Oral q1800  . ezetimibe  10 mg Oral Daily  . finasteride  2.5 mg Oral Daily  . omega-3 acid ethyl esters  1 g Oral BID  . Ticagrelor  90 mg Oral BID    ASSESSMENT AND PLAN: Active Problems:   Unstable angina - s/p PCI LAD, needs PCI to the CFX. MD review symptoms w/ patient and advise on changing Brilinta vs early f/u in office and consider change at that time if symptoms persist.     Hyperlipidemia - continue Rx, lipid profile above.    HTN - Diovan held on admit due to renal concerns, SBP running high on amlodipine and atenolol. HR 50s, so not able to increase atenolol. Restart Diovan, f/u in office and consider med changes then if still  high.    Plan - d/c today and f/u in office.   Signed, Rosaria Ferries , PA-C 6:51 AM 04/07/2013  Patient seen, examined. Available data reviewed. Agree with findings, assessment, and plan as outlined by Rosaria Ferries, PA-C. The patient had marked dyspnea after taking Brilinta last night. I don't think he will tolerate this. Recommend load with plavix 300 mg this am and start 75 mg daily tomorrow am. Otherwise he is doing fine. Lungs are clear, heart is RRR without murmur or gallop. There is no edema. Labs reviewed. Plan for staged PCI of the LCx reviewed. All of patient's questions answered. Other medications reviewed and appropriate.   Sherren Mocha, M.D. 04/07/2013 7:33 AM

## 2013-04-07 NOTE — Progress Notes (Signed)
Pt ambulatated independently without problems. Ed completed. Not interested in CRPII at this time but might consider after next PCI. [id number] Yves Dill CES, ACSM 8:55 AM 04/07/2013

## 2013-04-07 NOTE — Discharge Summary (Signed)
CARDIOLOGY DISCHARGE SUMMARY   Patient ID: Nicholas Douglas MRN: [id number] DOB/AGE: [birth year] 78 y.o.  Admit date: 04/05/2013 Discharge date: 04/07/2013  PCP: Marcello Fennel, MD Primary Cardiologist: Dr. Ellouise Newer  Primary Discharge Diagnosis: Unstable angina pain, class IV angina:  2.75 x 38 mm Promus stent to the proximal/mid LAD. The proximal LAD was then stented using a 3.0 x 28 mm Promus stent, overlapped     Secondary Discharge Diagnosis:  Hypertension Hyperlipidemia Renal cell carcinoma, status post left nephrectomy  Procedures: PTCA with rotational atherectomy and stenting of the proximal to mid LAD   Hospital Course: Nicholas Douglas is a 80 y.o. male with a long history of history of CAD. He developed chest pain and went to [facility] where his enzymes were negative but a Lexi scan was abnormal and a heart cath was performed. The cardiac catheterization showed significant two-vessel disease and he was transferred to [facility] for consideration of PCI versus bypass surgery.  The films were reviewed by Dr. [name], Dr. [place] and Dr. Burt Knack. Consensus was that percutaneous intervention was preferable to bypass surgery. However, because of his solitary kidney, it was recommended that the interventions be staged to limit dye use. He was hydrated overnight and taken to the cath lab on 3/3.  Cardiac catheterization results are below. He had successful percutaneous intervention to the LAD with 2 overlapping drug-eluting stents. He tolerated the procedure well.  His blood pressure was noted to be somewhat elevated, but his ARB had been held on admission because of the dye use and will be restarted at discharge. No increases possible to his beta blocker because of a heart rate in the 50s. He is encouraged to follow his blood pressure is an outpatient, as medication changes may be needed. A lipid profile was performed, results below. His lipid profile is acceptable and he is to  continue current medical therapy.  On 03/04, Nicholas Douglas was seen by Dr. Burt Knack. His cath site was without hematoma, although it had minimal ecchymosis. All data were reviewed. Nicholas Douglas was ambulating without chest pain or shortness of breath and considered stable for discharge, to have early office follow up and be scheduled for staged percutaneous intervention to the circumflex.  Labs:  Lab Results  Component Value Date   WBC 7.4 04/07/2013   HGB 14.6 04/07/2013   HCT 42.5 04/07/2013   MCV 85.9 04/07/2013   PLT 195 04/07/2013     Recent Labs Lab 04/06/13 0334 04/07/13 0530  NA 141 139  K 3.9 4.0  CL 106 104  CO2 25 22  BUN 18 16  CREATININE 1.22 1.02  CALCIUM 8.6 9.0  PROT 6.4  --   BILITOT 0.6  --   ALKPHOS 46  --   ALT 59*  --   AST 57*  --   GLUCOSE 106* 92   Lipid Panel     Component Value Date/Time   CHOL 139 04/06/2013 0334   TRIG 150* 04/06/2013 0334   HDL 45 04/06/2013 0334   CHOLHDL 3.1 04/06/2013 0334   VLDL 30 04/06/2013 0334   LDLCALC 64 04/06/2013 0334    Recent Labs  04/06/13 0334  INR 1.48    Cardiac Cath:  04/05/2013 catheterization results from [facility]: proxLAD 70%, midLAD 99%, distLAD 60%, midCFX 80%, OM2 ostial 60%, OM3 ostial 60%, RCA mult 40% lesions, EF 60% at stress test.  04/06/2013 PCI results: Procedural Details: The right wrist was prepped, draped, and  anesthetized with 1% lidocaine. Using the modified Seldinger technique, a 6 Fr sheath was introduced into the radial artery. 3 mg verapamil was administered through the radial sheath. Weight-based heparin was given for anticoagulation. Brilinta 180 mg was given orally. Once a therapeutic ACT was achieved, a 6 [place] XBLAD guide catheter was inserted. A rotofloppy coronary guidewire was used to cross the lesion. Rotational atherectomy was then performed on the proximal to mid LAD using a 1.5 mm Burr. We then dilated the most severe segment with a 2.5 mm cutting balloon. The mid LAD was then stented with  a 2.75 x 38 mm Promus stent. The stent was postdilated with a 3.0 mm noncompliant balloon. The proximal LAD was then stented using a 3.0 x 28 mm Promus stent in overlapping fashion. This was postdilated with a 3.25 mm noncompliant balloon. Following PCI, there was 0% residual stenosis and TIMI-3 flow. Final angiography confirmed an excellent result. The patient tolerated the procedure well. There were no immediate procedural complications. A TR band was used for radial hemostasis. The patient was transferred to the post catheterization recovery area for further monitoring.  Lesion Data:  Vessel: LAD  Percent stenosis (pre): 95%  TIMI-flow (pre): 3  Stent: 2.75 x 38 mm and 3.0 x 28 mm Promus stents  Percent stenosis (post): 0%  TIMI-flow (post): 3  Conclusions: Successful rotational atherectomy and stenting of the proximal to mid LAD.  Recommendations: Dual antiplatelet therapy for at least one year. Stage PCI of LCx   FOLLOW UP PLANS AND APPOINTMENTS Allergies  Allergen Reactions  . Morphine And Related Hives     Medication List         amLODipine 10 MG tablet  Commonly known as:  NORVASC  Take 10 mg by mouth daily.     aspirin 81 MG tablet  Take 81 mg by mouth daily.     atenolol 25 MG tablet  Commonly known as:  TENORMIN  Take 0.5 tablets (12.5 mg total) by mouth 2 (two) times daily.     clopidogrel 75 MG tablet  Commonly known as:  PLAVIX  Take 1 tablet (75 mg total) by mouth daily.     ezetimibe 10 MG tablet  Commonly known as:  ZETIA  Take 10 mg by mouth daily.     finasteride 5 MG tablet  Commonly known as:  PROSCAR  Take 0.5 tablets by mouth daily.     nitroGLYCERIN 0.4 MG SL tablet  Commonly known as:  NITROSTAT  Place 0.4 mg under the tongue every 5 (five) minutes as needed for chest pain.     omega-3 acid ethyl esters 1 G capsule  Commonly known as:  LOVAZA  Take 1 g by mouth 2 (two) times daily.     Pitavastatin Calcium 4 MG Tabs  Take by mouth daily.  Take 1/2 tablet daily     valsartan 160 MG tablet  Commonly known as:  DIOVAN  Take 160 mg by mouth daily. Take 1/2 tablet daily        Discharge Orders   Future Orders Complete By Expires   Diet - low sodium heart healthy  As directed    Increase activity slowly  As directed      Follow-up Information   Follow up with [name], MD. (The office will call)    Specialty:  Cardiology   Contact information:   [address] [place] [address] [place]  WITH YOU TO FOLLOW UP APPOINTMENTS  Time spent with patient to include physician time: 41 min Signed: Rosaria Ferries, PA-C 04/07/2013, 8:28 AM Co-Sign MD

## 2013-04-07 NOTE — Discharge Instructions (Signed)
PLEASE REMEMBER TO BRING ALL OF YOUR MEDICATIONS TO EACH OF YOUR FOLLOW-UP OFFICE VISITS. ° °PLEASE ATTEND ALL SCHEDULED FOLLOW-UP APPOINTMENTS.  ° °Activity: Increase activity slowly as tolerated. You may shower, but no soaking baths (or swimming) for 1 week. No driving for 2 days. No lifting over 5 lbs for 1 week. No sexual activity for 1 week.  ° °You May Return to Work: in 1 week (if applicable) ° °Wound Care: You may wash cath site gently with soap and water. Keep cath site clean and dry. If you notice pain, swelling, bleeding or pus at your cath site, please call [phone]. ° ° ° °Cardiac Cath Site Care °Refer to this sheet in the next few weeks. These instructions provide you with information on caring for yourself after your procedure. Your caregiver may also give you more specific instructions. Your treatment has been planned according to current medical practices, but problems sometimes occur. Call your caregiver if you have any problems or questions after your procedure. °HOME CARE INSTRUCTIONS °· You may shower 24 hours after the procedure. Remove the bandage (dressing) and gently wash the site with plain soap and water. Gently pat the site dry.  °· Do not apply powder or lotion to the site.  °· Do not sit in a bathtub, swimming pool, or whirlpool for 5 to 7 days.  °· No bending, squatting, or lifting anything over 10 pounds (4.5 kg) as directed by your caregiver.  °· Inspect the site at least twice daily.  °· Do not drive home if you are discharged the same day of the procedure. Have someone else drive you.  °· You may drive 24 hours after the procedure unless otherwise instructed by your caregiver.  °What to expect: °· Any bruising will usually fade within 1 to 2 weeks.  °· Blood that collects in the tissue (hematoma) may be painful to the touch. It should usually decrease in size and tenderness within 1 to 2 weeks.  °SEEK IMMEDIATE MEDICAL CARE IF: °· You have unusual pain at the site or down the  affected limb.  °· You have redness, warmth, swelling, or pain at the site.  °· You have drainage (other than a small amount of blood on the dressing).  °· You have chills.  °· You have a fever or persistent symptoms for more than 72 hours.  °· You have a fever and your symptoms suddenly get worse.  °· Your leg becomes pale, cool, tingly, or numb.  °· You have heavy bleeding from the site. Hold pressure on the site.  °Document Released: 02/23/2010 Document Revised: 01/10/2011 Document Reviewed: 02/23/2010 °ExitCare® Patient Information ©2012 [employer]. ° °

## 2013-04-14 ENCOUNTER — Telehealth: Payer: Self-pay | Admitting: Cardiovascular Disease

## 2013-04-14 NOTE — Telephone Encounter (Signed)
SPOKE TO WIFE. NO LABS ARE NEED BEFORE APPOINTMENT WITH DR [name] VERBALIZED UNDERSTANDING.

## 2013-04-14 NOTE — Telephone Encounter (Signed)
Pt had stents put in last week and is coming to see Dr [name] nest week. Does he need to have lab work before his appt next week?

## 2013-04-22 ENCOUNTER — Ambulatory Visit (INDEPENDENT_AMBULATORY_CARE_PROVIDER_SITE_OTHER): Payer: Medicare Other | Admitting: Cardiovascular Disease

## 2013-04-22 ENCOUNTER — Encounter: Payer: Self-pay | Admitting: Cardiovascular Disease

## 2013-04-22 VITALS — BP 132/78 | Ht 67.5 in | Wt 194.3 lb

## 2013-04-22 DIAGNOSIS — D689 Coagulation defect, unspecified: Secondary | ICD-10-CM

## 2013-04-22 DIAGNOSIS — I10 Essential (primary) hypertension: Secondary | ICD-10-CM

## 2013-04-22 DIAGNOSIS — I200 Unstable angina: Secondary | ICD-10-CM

## 2013-04-22 DIAGNOSIS — Z01818 Encounter for other preprocedural examination: Secondary | ICD-10-CM

## 2013-04-22 DIAGNOSIS — I2510 Atherosclerotic heart disease of native coronary artery without angina pectoris: Secondary | ICD-10-CM

## 2013-04-22 DIAGNOSIS — E785 Hyperlipidemia, unspecified: Secondary | ICD-10-CM

## 2013-04-22 DIAGNOSIS — R5383 Other fatigue: Secondary | ICD-10-CM

## 2013-04-22 DIAGNOSIS — R5381 Other malaise: Secondary | ICD-10-CM

## 2013-04-22 MED ORDER — CLOPIDOGREL BISULFATE 75 MG PO TABS: 75.0000 mg | ORAL_TABLET | Freq: Every day | ORAL | Status: DC

## 2013-04-22 MED ORDER — ISOSORBIDE MONONITRATE ER 30 MG PO TB24: 30.0000 mg | ORAL_TABLET | Freq: Every day | ORAL | Status: DC

## 2013-04-22 NOTE — Patient Instructions (Signed)
Your physician has recommended you make the following change in your medication: start new prescription for isosorbide. This has already been sent to the pharmacy.  Your physician has requested that you have a cardiac catheterization. Cardiac catheterization is used to diagnose and/or treat various heart conditions. Doctors may recommend this procedure for a number of different reasons. The most common reason is to evaluate chest pain. Chest pain can be a symptom of coronary artery disease (CAD), and cardiac catheterization can show whether plaque is narrowing or blocking your heart's arteries. This procedure is also used to evaluate the valves, as well as measure the blood flow and oxygen levels in different parts of your heart. For further information please visit [email or web address]. Please follow instruction sheet, as given. This will be scheduled for  March 24th.  Your physician recommends that you return for lab work tomorrow.  Your physician recommends that you schedule a follow-up appointment in: 2 weeks after your procedure with Dr. [name] or extender. This will be scheduled at the time of your discharge.

## 2013-04-23 ENCOUNTER — Encounter: Payer: Self-pay | Admitting: Cardiovascular Disease

## 2013-04-24 ENCOUNTER — Encounter: Payer: Self-pay | Admitting: Cardiovascular Disease

## 2013-04-24 LAB — PROTIME-INR
INR: 1 (ref 0.8–1.2)
PROTHROMBIN TIME: 10.4 s (ref 9.1–12.0)

## 2013-04-24 LAB — CBC
HCT: 46.7 % (ref 37.5–51.0)
Hemoglobin: 15.2 g/dL (ref 12.6–17.7)
MCH: 28.7 pg (ref 26.6–33.0)
MCHC: 32.5 g/dL (ref 31.5–35.7)
MCV: 88 fL (ref 79–97)
Platelets: 267 10*3/uL (ref 150–379)
RBC: 5.29 x10E6/uL (ref 4.14–5.80)
RDW: 13.2 % (ref 12.3–15.4)
WBC: 6.2 10*3/uL (ref 3.4–10.8)

## 2013-04-24 LAB — APTT: APTT: 29 s (ref 24–33)

## 2013-04-24 LAB — BASIC METABOLIC PANEL
BUN / CREAT RATIO: 16 (ref 10–22)
BUN: 20 mg/dL (ref 8–27)
CHLORIDE: 102 mmol/L (ref 97–108)
CO2: 23 mmol/L (ref 18–29)
Calcium: 9.8 mg/dL (ref 8.6–10.2)
Creatinine, Ser: 1.27 mg/dL (ref 0.76–1.27)
GFR calc Af Amer: 61 mL/min/{1.73_m2} (ref >59)
GFR calc non Af Amer: 53 mL/min/{1.73_m2} — ABNORMAL LOW (ref >59)
Glucose: 102 mg/dL — ABNORMAL HIGH (ref 65–99)
Potassium: 5 mmol/L (ref 3.5–5.2)
Sodium: 143 mmol/L (ref 134–144)

## 2013-04-25 ENCOUNTER — Encounter: Payer: Self-pay | Admitting: Cardiovascular Disease

## 2013-04-25 NOTE — Progress Notes (Signed)
Patient ID: Nicholas Douglas, male   DOB: 03/21/1931, 80 y.o.   MRN: [id number]       HPI: Nicholas Douglas, Nicholas Douglas a 80 y.o. male who presents to the office today in follow-up of his recent presentation with unstable angina.  [name] has known CAD and underwent numerous interventions dating back to 42, 1991, 1993, and in 1997. Cardiac catheterization in 2011 which showed preserved LV function with mild residual distal inferior apical hypocontractility. There is evidence for coronary calcification with segmental narrowing of his LAD of 30-40% proximally, 50% diffusely, in the midsegment, and 70-80% in the distal region, he had AV groove circumflex stenoses of 70 and 50% with a 40% OM 2 stenosis, and a 30-40 and 50% RCA stenoses with 80-90% stenosis in the acute marginal branch. He had been on medical therapy.  He recently presented to [facility] with class IV angina and catheterization demonstrated severe 2 vessel CAD with significant current calcification of the LAD with up to 95% mid LAD stenosis and diffuse proximal stenosis. He underwent successful high-speed rotational atherectomy the following day by Dr. Martinique and a 1.5 mm bur was used. Mid LAD was stented with a 2.75x38 mm Promus stent in the proximal LAD was stented with a 3.0x28 mm Promus stent. Medical therapy was recommended for his distal LAD stenosis. He has only one kidney. He is in need for staged intervention to the left circumflex coronary artery. He presents now for followup evaluation and to reschedule that staged intervention. His chest pain has significantly improved. However, he still experiences episodes of chest discomfort. He is unaware of palpitations. He denies presyncope or syncope.  He also has a history of significant hyperlipidemia and has had significant increased number of small LDL particles and insulin resistance. This seemed to markedly improve with the addition of Niaspan added to zetia and lovaza.  Remotely, he had been  on statins consisting of Lipitor and subsequently Crestor. He did have transient LFT elevation. He also concerns of possible risk of developing dementia with statin therapy.  He had  some episodes of Niaspan induced diffuse flushing. He wanted to stop taking his Niaspan. He did have subsequent NMR off Niaspan and done just on Zetia and as well as 2 g of Lovaza.  This showed increased abnormalities such that his total cholesterol was 201, LDL had risen to 124, but he now had LDL small particles which have increased from 685 to 1348. When I last saw him, we elected to try little low and ultimately titrated this to 4 mg daily to take in addition to his Zetia 10 mg per apparently in late July he developed abdominal discomfort and was found to have acute appendicitis. On CT imaging he was also noted to have prostate enlargement with nodularity and thickening of the bladder base and cystoscopy was suggested for further evaluation. He is status post left nephrectomy. He also was noted to have a 2.3 cm infrarenal suprailiac abdominal aortic aneurysm.  Prior laboratory on 10/26/2012 LDL particle number was elevated at 1657, LDL cholesterol 112 triglycerides 155 and total cholesterol 190. He continued to be insulin resistant with insulin resistance scored 65.  Of note, serum creatinine has risen to 1.33 and his estimated GFR was approximately 50 states his blood pressure at home tends to be relatively low typically in the 110s to less than 130.   Subsequent  laboratory on 02/22/2013. His LDL particle number was now markedly improved at 774 with an HDL of 40  calculated LDL 50 small LDL particle #417. Insulin resistance score was also improved at 51. He did have very minimal transaminase elevation with an AST of 45 and an ALT of 58. Potassium was 5.4. He does admit to recently eating foods with increased potassium source. Creatinine was 1.16   Past Medical History  Diagnosis Date  . Myocardial infarction   .  Hypertension   . Personal history of colonic polyps   . Unspecified congenital cystic kidney disease   . Other abnormal blood chemistry   . Other nonspecific abnormal serum enzyme levels   . Malignant neoplasm of kidney, except pelvis   . Calculus of kidney   . Coronary atherosclerosis of unspecified type of vessel, native or graft   . Impotence of organic origin   . Tobacco use disorder   . Pure hypercholesterolemia   . Internal hemorrhoids without mention of complication   . Melanoma 06/2010    Left arm    Past Surgical History  Procedure Laterality Date  . Kidney surgery    . Cardiac catheterization    . Angioplasty    . Melanoma excision    . Colonoscopy  10/05/2008    single polyp, IH.  Nicholas Douglas.  Repeat in 3 years.  . Carotid dopplers  03/08/2011    minimal plaque formation B. Symptoms: dizziness.  . Cardiolite  05/06/2011    low risk study; normal EF.  SE H&V.    Allergies  Allergen Reactions  . Morphine And Related Hives    Current Outpatient Prescriptions  Medication Sig Dispense Refill  . amLODipine (NORVASC) 10 MG tablet Take 10 mg by mouth daily.      Marland Kitchen aspirin 81 MG tablet Take 81 mg by mouth daily.      Marland Kitchen atenolol (TENORMIN) 25 MG tablet Take 0.5 tablets (12.5 mg total) by mouth 2 (two) times daily.  90 tablet  3  . clopidogrel (PLAVIX) 75 MG tablet Take 1 tablet (75 mg total) by mouth daily.  90 tablet  3  . ezetimibe (ZETIA) 10 MG tablet Take 10 mg by mouth daily.      . finasteride (PROSCAR) 5 MG tablet Take 0.5 tablets by mouth daily.      . nitroGLYCERIN (NITROSTAT) 0.4 MG SL tablet Place 1 tablet (0.4 mg total) under the tongue every 5 (five) minutes as needed for chest pain.  25 tablet  12  . omega-3 acid ethyl esters (LOVAZA) 1 G capsule Take 1 g by mouth 2 (two) times daily.      . Pitavastatin Calcium 4 MG TABS Take by mouth daily. Take 1/2 tablet daily      . valsartan (DIOVAN) 160 MG tablet Take 160 mg by mouth daily. Take 1/2 tablet daily      .  isosorbide mononitrate (IMDUR) 30 MG 24 hr tablet Take 1 tablet (30 mg total) by mouth daily.  30 tablet  3   No current facility-administered medications for this visit.    Socially he remains active. He is married has 5 children 10 grandchildren 2 great-grandchildren. Is no alcohol use. He typically scores below 75 and plays golf 4 days per week.  ROS is negative for fevers, chills or night sweats.He denies visual symptoms. He denies change in hearing. There is no lymphadenopathy There is no cough He denies wheezing; he denies palpitations. There is no presyncope or syncope. He still has noticed some mild chest discomfort. He denies abdominal pain since his appendectomy. He does admit to increased  gas and dyspepsia. There is no nausea vomiting or diarrhea. There is no blood in stool or urine. He denies paresthesias. He denies claudication. There are no myalgias. He denies edema. He denies cold or heat intolerance. Other comprehensive 14 point system review is negative.  PE BP 132/78  Ht 5' 7.5" (1.715 m)  Wt 194 lb 4.8 oz (88.134 kg)  BMI 29.97 kg/m2  General: Alert, oriented, no distress.  Skin: normal turgor, no rashes HEENT: Normocephalic, atraumatic. Pupils round and reactive; sclera anicteric;no lid lag.  Nose without nasal septal hypertrophy Mouth/Parynx benign; Mallinpatti scale 2 Neck: No JVD, no carotid bruits; normal carotid up stroke Lungs: clear to ausculatation and percussion; no wheezing or rales Chest wall: Nontender to palpation the Heart: RRR, s1 s2 normal 1/6 sem; no diastolic murmur. No rubs thrills or heaves. Abdomen: soft, nontender; no hepatosplenomehaly, BS+; abdominal aorta nontender and not dilated by palpation. Back: No CVA tenderness. Pulses 2+ Extremities: no clubbing cyanosis or edema, Homan's sign negative  Neurologic: grossly nonfocal  ECG (independently read by me): Sinus bradycardia 55 beats per minute. Nonspecific ST changes  03/01/2013 ECG  (independently read by me): Sinus bradycardia 52 beats per minute. Normal intervals. No significant ST changes.  Prior ECG of 11/20/2012: Sinus rhythm at 51 beats per minute. QTc interval 400 ms. No significant ST changes.  LABS:  BMET    Component Value Date/Time   NA 143 04/23/2013 0810   NA 139 04/07/2013 0530   K 5.0 04/23/2013 0810   CL 102 04/23/2013 0810   CO2 23 04/23/2013 0810   GLUCOSE 102* 04/23/2013 0810   GLUCOSE 92 04/07/2013 0530   BUN 20 04/23/2013 0810   BUN 16 04/07/2013 0530   CREATININE 1.27 04/23/2013 0810   CALCIUM 9.8 04/23/2013 0810   GFRNONAA 53* 04/23/2013 0810   GFRAA 61 04/23/2013 0810     Hepatic Function Panel     Component Value Date/Time   PROT 6.4 04/06/2013 0334   PROT 7.1 02/22/2013 0804   ALBUMIN 3.0* 04/06/2013 0334   AST 57* 04/06/2013 0334   ALT 59* 04/06/2013 0334   ALKPHOS 46 04/06/2013 0334   BILITOT 0.6 04/06/2013 0334     CBC    Component Value Date/Time   WBC 6.2 04/23/2013 0810   WBC 7.4 04/07/2013 0530   RBC 5.29 04/23/2013 0810   RBC 4.95 04/07/2013 0530   HGB 15.2 04/23/2013 0810   HCT 46.7 04/23/2013 0810   PLT 267 04/23/2013 0810   MCV 88 04/23/2013 0810   MCH 28.7 04/23/2013 0810   MCH 29.5 04/07/2013 0530   MCHC 32.5 04/23/2013 0810   MCHC 34.4 04/07/2013 0530   RDW 13.2 04/23/2013 0810   RDW 12.8 04/07/2013 0530   LYMPHSABS 1.8 05/08/2011 1354   MONOABS 1.0 05/08/2011 1354   EOSABS 0.3 05/08/2011 1354   BASOSABS 0.0 05/08/2011 1354     BNP    Component Value Date/Time   PROBNP 43.0 01/08/2010 1642    Lipid Panel     Component Value Date/Time   CHOL 139 04/06/2013 0334     RADIOLOGY: Dg Chest 2 View  08/17/2012   *RADIOLOGY REPORT*  Clinical Data: Renal cell carcinoma  CHEST - 2 VIEW  Comparison: 05/08/11  Findings: The cardiomediastinal silhouette is stable.  No acute infiltrate or pleural effusion.  No pulmonary edema.  Bony thorax is unremarkable.  IMPRESSION: No active disease.  No significant change.   Original Report Authenticated By: Lahoma Crocker, M.D.  ASSESSMENT AND PLAN: Mr. Kimball has established coronary artery disease dating back to 78 and has undergone multiple interventions in the past over a 10 year period from 23 to 1997. A nuclear perfusion study in April 2013 which remained stable. 3 weeks ago, he developed unstable angina symptomatology and catheterization done initially at [facility] showed multivessel disease. Options were discussed included bypass surgery versus intervention. Due to the significant calcification he underwent initial high-speed rotational laparotomy of his proximal to mid LAD lesions. He does have a distal left circumflex stenosis. 2 his one remaining kidney and to reduce the risk of potential contrast nephropathy stage intervention to circumflex artery was deferred. Since his intervention his symptomatology has improved but he still experiences both some episodes of chest tightness and also episodes of gas and dyspepsia. I recommended the addition of simethicone well as protonic 40 mg daily. Also adding low dose isosorbide mononitrate at 30 mg. We will schedule him for elective left circumflex PCI within the next week. We discussed the risks benefits of the procedure. With reference to his hyperlipidemia,  His lipid profile is markedly improved with an LDL particle number at goal. In the past he did develop mild transaminase elevations with statins. He is tolerating the reduced dose of Livalo 2 mg and  Zetia 10 mg as prescribed. Also discussed with him recent to Improve-it trial data. His renal function at discharge on 04/07/2013 was stable with a creatinine of 1.02 and he is tolerating the resumption of valsartan 80 mg daily, atenolol 12.5 mg twice a day in addition to his amlodipine 10 mg daily for blood pressure control. He is on dual antiplatelet therapy with low-dose aspirin and Plavix 75 mg daily.  [name], MD, [facility]  04/25/2013 10:37 AM

## 2013-04-26 ENCOUNTER — Encounter (HOSPITAL_COMMUNITY): Payer: Self-pay | Admitting: Pharmacy Technician

## 2013-04-26 ENCOUNTER — Other Ambulatory Visit: Payer: Self-pay | Admitting: *Deleted

## 2013-04-26 ENCOUNTER — Telehealth: Payer: Self-pay | Admitting: Cardiovascular Disease

## 2013-04-26 DIAGNOSIS — Z01818 Encounter for other preprocedural examination: Secondary | ICD-10-CM

## 2013-04-26 NOTE — Telephone Encounter (Signed)
Spoke to [place].  Judeen Hammans stated orders for cardiac cath are not entered for 04/27/13 procedure.  Informed Judeen Hammans that order will be placed later today will notify Dr Arnette Norris CMA.    RN notified Mariann Laster CMA.

## 2013-04-26 NOTE — Telephone Encounter (Signed)
Returned call and pt verified x 2.  Pt informed message received.  Pt did not receive letter for procedure.  RN read letter and also released it in [place].  RN waited on phone w/ pt and wife for 5 mins after sending note to MyChart account and letter received.  Pt reviewed letter and w/o questions.  Pt advised to call back w/ any questions or concerns.  Pt verbalized understanding and agreed w/ plan.

## 2013-04-26 NOTE — Telephone Encounter (Signed)
Wants to know if you received his lab work he had on Friday at [place]? He is going to have a stent put in tomorrow ,does he need to stop any of his medicine?Also does he need to be pre-admitted?

## 2013-04-27 ENCOUNTER — Ambulatory Visit (HOSPITAL_COMMUNITY)
Admission: RE | Admit: 2013-04-27 | Discharge: 2013-04-28 | Disposition: A | Discharge status: Home / Self Care | Payer: Medicare Other | Source: Ambulatory Visit | Attending: Cardiovascular Disease | Admitting: Cardiovascular Disease

## 2013-04-27 ENCOUNTER — Encounter (HOSPITAL_COMMUNITY)
Admission: RE | Disposition: A | Discharge status: Home / Self Care | Payer: Medicare Other | Source: Ambulatory Visit | Attending: Cardiovascular Disease

## 2013-04-27 ENCOUNTER — Encounter (HOSPITAL_COMMUNITY): Payer: Self-pay | Admitting: General Practice

## 2013-04-27 DIAGNOSIS — I200 Unstable angina: Secondary | ICD-10-CM | POA: Insufficient documentation

## 2013-04-27 DIAGNOSIS — Z7902 Long term (current) use of antithrombotics/antiplatelets: Secondary | ICD-10-CM | POA: Insufficient documentation

## 2013-04-27 DIAGNOSIS — Z85820 Personal history of malignant melanoma of skin: Secondary | ICD-10-CM | POA: Insufficient documentation

## 2013-04-27 DIAGNOSIS — I2510 Atherosclerotic heart disease of native coronary artery without angina pectoris: Secondary | ICD-10-CM

## 2013-04-27 DIAGNOSIS — Z85528 Personal history of other malignant neoplasm of kidney: Secondary | ICD-10-CM | POA: Insufficient documentation

## 2013-04-27 DIAGNOSIS — E785 Hyperlipidemia, unspecified: Secondary | ICD-10-CM | POA: Diagnosis present

## 2013-04-27 DIAGNOSIS — I498 Other specified cardiac arrhythmias: Secondary | ICD-10-CM | POA: Insufficient documentation

## 2013-04-27 DIAGNOSIS — I25119 Atherosclerotic heart disease of native coronary artery with unspecified angina pectoris: Secondary | ICD-10-CM | POA: Diagnosis present

## 2013-04-27 DIAGNOSIS — I25110 Atherosclerotic heart disease of native coronary artery with unstable angina pectoris: Secondary | ICD-10-CM | POA: Diagnosis present

## 2013-04-27 DIAGNOSIS — I10 Essential (primary) hypertension: Secondary | ICD-10-CM | POA: Diagnosis present

## 2013-04-27 DIAGNOSIS — I252 Old myocardial infarction: Secondary | ICD-10-CM | POA: Insufficient documentation

## 2013-04-27 DIAGNOSIS — Z9889 Other specified postprocedural states: Secondary | ICD-10-CM | POA: Insufficient documentation

## 2013-04-27 DIAGNOSIS — K648 Other hemorrhoids: Secondary | ICD-10-CM | POA: Insufficient documentation

## 2013-04-27 DIAGNOSIS — N529 Male erectile dysfunction, unspecified: Secondary | ICD-10-CM | POA: Insufficient documentation

## 2013-04-27 DIAGNOSIS — Z9861 Coronary angioplasty status: Secondary | ICD-10-CM | POA: Insufficient documentation

## 2013-04-27 DIAGNOSIS — Z01818 Encounter for other preprocedural examination: Secondary | ICD-10-CM

## 2013-04-27 DIAGNOSIS — Z905 Acquired absence of kidney: Secondary | ICD-10-CM | POA: Insufficient documentation

## 2013-04-27 DIAGNOSIS — E7800 Pure hypercholesterolemia, unspecified: Secondary | ICD-10-CM | POA: Insufficient documentation

## 2013-04-27 DIAGNOSIS — F17200 Nicotine dependence, unspecified, uncomplicated: Secondary | ICD-10-CM | POA: Insufficient documentation

## 2013-04-27 DIAGNOSIS — I714 Abdominal aortic aneurysm, without rupture, unspecified: Secondary | ICD-10-CM | POA: Insufficient documentation

## 2013-04-27 DIAGNOSIS — Z7982 Long term (current) use of aspirin: Secondary | ICD-10-CM | POA: Insufficient documentation

## 2013-04-27 HISTORY — PX: PERCUTANEOUS CORONARY STENT INTERVENTION (PCI-S): SHX5485

## 2013-04-27 LAB — POCT ACTIVATED CLOTTING TIME: ACTIVATED CLOTTING TIME: 453 s

## 2013-04-27 SURGERY — PERCUTANEOUS CORONARY STENT INTERVENTION (PCI-S)
Anesthesia: LOCAL

## 2013-04-27 MED ORDER — DEXTROSE-NACL 5-0.45 % IV SOLN
INTRAVENOUS | Status: DC
  Administered 2013-04-27: 06:00:00 via INTRAVENOUS

## 2013-04-27 MED ORDER — FENTANYL CITRATE 0.05 MG/ML IJ SOLN
INTRAMUSCULAR | Status: AC
  Filled 2013-04-27: qty 2

## 2013-04-27 MED ORDER — ASPIRIN EC 81 MG PO TBEC
81.0000 mg | DELAYED_RELEASE_TABLET | Freq: Every day | ORAL | Status: DC
  Filled 2013-04-27 (×2): qty 1

## 2013-04-27 MED ORDER — HEPARIN SODIUM (PORCINE) 1000 UNIT/ML IJ SOLN
INTRAMUSCULAR | Status: AC
  Filled 2013-04-27: qty 1

## 2013-04-27 MED ORDER — BIVALIRUDIN 250 MG IV SOLR
INTRAVENOUS | Status: AC
  Filled 2013-04-27: qty 250

## 2013-04-27 MED ORDER — CLOPIDOGREL BISULFATE 75 MG PO TABS
ORAL_TABLET | ORAL | Status: AC
  Filled 2013-04-27: qty 1

## 2013-04-27 MED ORDER — HYDRALAZINE HCL 20 MG/ML IJ SOLN
10.0000 mg | INTRAMUSCULAR | Status: DC | PRN
  Administered 2013-04-27: 13:00:00 10 mg via INTRAVENOUS

## 2013-04-27 MED ORDER — ASPIRIN 81 MG PO CHEW: 81.0000 mg | CHEWABLE_TABLET | ORAL | Status: DC

## 2013-04-27 MED ORDER — MIDAZOLAM HCL 2 MG/2ML IJ SOLN
INTRAMUSCULAR | Status: AC
  Filled 2013-04-27: qty 2

## 2013-04-27 MED ORDER — DIAZEPAM 5 MG PO TABS
5.0000 mg | ORAL_TABLET | ORAL | Status: AC
  Administered 2013-04-27: 5 mg via ORAL
  Filled 2013-04-27: qty 1

## 2013-04-27 MED ORDER — NITROGLYCERIN IN D5W 200-5 MCG/ML-% IV SOLN: 2.0000 ug/min | INTRAVENOUS | Status: DC

## 2013-04-27 MED ORDER — CLOPIDOGREL BISULFATE 75 MG PO TABS: 75.0000 mg | ORAL_TABLET | Freq: Every day | ORAL | Status: DC

## 2013-04-27 MED ORDER — VERAPAMIL HCL 2.5 MG/ML IV SOLN
INTRAVENOUS | Status: AC
Start: 2013-04-27 — End: 2013-04-27
  Filled 2013-04-27: qty 2

## 2013-04-27 MED ORDER — SODIUM CHLORIDE 0.9 % IV SOLN
1.7500 mg/kg/h | INTRAVENOUS | Status: DC
  Filled 2013-04-27: qty 250

## 2013-04-27 MED ORDER — HEPARIN (PORCINE) IN NACL 2-0.9 UNIT/ML-% IJ SOLN
INTRAMUSCULAR | Status: AC
  Filled 2013-04-27: qty 1500

## 2013-04-27 MED ORDER — LIDOCAINE HCL (PF) 1 % IJ SOLN
INTRAMUSCULAR | Status: AC
  Filled 2013-04-27: qty 30

## 2013-04-27 MED ORDER — SODIUM CHLORIDE 0.9 % IV SOLN: INTRAVENOUS | Status: DC

## 2013-04-27 MED ORDER — SODIUM CHLORIDE 0.9 % IJ SOLN: 3.0000 mL | INTRAMUSCULAR | Status: DC | PRN

## 2013-04-27 MED ORDER — HYDRALAZINE HCL 20 MG/ML IJ SOLN
INTRAMUSCULAR | Status: AC
  Filled 2013-04-27: qty 1

## 2013-04-27 MED ORDER — SODIUM CHLORIDE 0.9 % IJ SOLN: 3.0000 mL | Freq: Two times a day (BID) | INTRAMUSCULAR | Status: DC

## 2013-04-27 MED ORDER — NITROGLYCERIN 0.2 MG/ML ON CALL CATH LAB
INTRAVENOUS | Status: AC
  Filled 2013-04-27: qty 1

## 2013-04-27 MED ORDER — SODIUM CHLORIDE 0.9 % IV SOLN: 250.0000 mL | INTRAVENOUS | Status: DC | PRN

## 2013-04-27 MED ORDER — VERAPAMIL HCL 2.5 MG/ML IV SOLN
INTRAVENOUS | Status: AC
  Filled 2013-04-27: qty 2

## 2013-04-27 NOTE — Interval H&P Note (Signed)
History and Physical Interval Note:  04/27/2013 7:39 AM  Armando Gang  has presented today for surgery, with the diagnosis of cad  The various methods of treatment have been discussed with the patient and family. After consideration of risks, benefits and other options for treatment, the patient has consented to  Procedure(s): ROTATIONAL ATHERECTOMY WITH Cath Lab Visit (complete for each Cath Lab visit)  Clinical Evaluation Leading to the Procedure:   ACS: no  Non-ACS:    Anginal Classification: CCS III  Anti-ischemic medical therapy: Maximal Therapy (2 or more classes of medications)  Non-Invasive Test Results: No non-invasive testing performed  Prior CABG: No previous CABG      PERCUTANEOUS CORONARY STENT INTERVENTION (PCI-S) (N/A) as a surgical intervention .  The patient's history has been reviewed, patient examined, no change in status, stable for surgery.  I have reviewed the patient's chart and labs.  Questions were answered to the patient's satisfaction.     Timika Muench A

## 2013-04-27 NOTE — Interval H&P Note (Signed)
History and Physical Interval Note:  04/27/2013 7:38 AM  Nicholas Douglas  has presented today for surgery, with the diagnosis of cad  The various methods of treatment have been discussed with the patient and family. After consideration of risks, benefits and other options for treatment, the patient has consented to  Procedure(s): Rotational Atherectomy with PERCUTANEOUS CORONARY STENT INTERVENTION (PCI-S) (N/A) as a surgical intervention .  The patient's history has been reviewed, patient examined, no change in status, stable for surgery.  I have reviewed the patient's chart and labs.  Questions were answered to the patient's satisfaction.     Erza Mothershead A

## 2013-04-27 NOTE — H&P (View-Only) (Signed)
Patient ID: Nicholas Douglas, male   DOB: September 25, 1931, 80 y.o.   MRN: [id number]       HPI: lacey, chaussee a 80 y.o. male who presents to the office today in follow-up of his recent presentation with unstable angina.  Nicholas Douglas has known CAD and underwent numerous interventions dating back to 6, 1991, 1993, and in 1997. Cardiac catheterization in 2011 which showed preserved LV function with mild residual distal inferior apical hypocontractility. There is evidence for coronary calcification with segmental narrowing of his LAD of 30-40% proximally, 50% diffusely, in the midsegment, and 70-80% in the distal region, he had AV groove circumflex stenoses of 70 and 50% with a 40% OM 2 stenosis, and a 30-40 and 50% RCA stenoses with 80-90% stenosis in the acute marginal branch. He had been on medical therapy.  He recently presented to [facility] with class IV angina and catheterization demonstrated severe 2 vessel CAD with significant current calcification of the LAD with up to 95% mid LAD stenosis and diffuse proximal stenosis. He underwent successful high-speed rotational atherectomy the following day by Dr. Martinique and a 1.5 mm bur was used. Mid LAD was stented with a 2.75x38 mm Promus stent in the proximal LAD was stented with a 3.0x28 mm Promus stent. Medical therapy was recommended for his distal LAD stenosis. He has only one kidney. He is in need for staged intervention to the left circumflex coronary artery. He presents now for followup evaluation and to reschedule that staged intervention. His chest pain has significantly improved. However, he still experiences episodes of chest discomfort. He is unaware of palpitations. He denies presyncope or syncope.  He also has a history of significant hyperlipidemia and has had significant increased number of small LDL particles and insulin resistance. This seemed to markedly improve with the addition of Niaspan added to zetia and lovaza.  Remotely, he had been  on statins consisting of Lipitor and subsequently Crestor. He did have transient LFT elevation. He also concerns of possible risk of developing dementia with statin therapy.  He had  some episodes of Niaspan induced diffuse flushing. He wanted to stop taking his Niaspan. He did have subsequent NMR off Niaspan and done just on Zetia and as well as 2 g of Lovaza.  This showed increased abnormalities such that his total cholesterol was 201, LDL had risen to 124, but he now had LDL small particles which have increased from 685 to 1348. When I last saw him, we elected to try little low and ultimately titrated this to 4 mg daily to take in addition to his Zetia 10 mg per apparently in late July he developed abdominal discomfort and was found to have acute appendicitis. On CT imaging he was also noted to have prostate enlargement with nodularity and thickening of the bladder base and cystoscopy was suggested for further evaluation. He is status post left nephrectomy. He also was noted to have a 2.3 cm infrarenal suprailiac abdominal aortic aneurysm.  Prior laboratory on 10/26/2012 LDL particle number was elevated at 1657, LDL cholesterol 112 triglycerides 155 and total cholesterol 190. He continued to be insulin resistant with insulin resistance scored 65.  Of note, serum creatinine has risen to 1.33 and his estimated GFR was approximately 50 states his blood pressure at home tends to be relatively low typically in the 110s to less than 130.   Subsequent  laboratory on 02/22/2013. His LDL particle number was now markedly improved at 774 with an HDL of 40  calculated LDL 50 small LDL particle #417. Insulin resistance score was also improved at 51. He did have very minimal transaminase elevation with an AST of 45 and an ALT of 58. Potassium was 5.4. He does admit to recently eating foods with increased potassium source. Creatinine was 1.16   Past Medical History  Diagnosis Date  . Myocardial infarction   .  Hypertension   . Personal history of colonic polyps   . Unspecified congenital cystic kidney disease   . Other abnormal blood chemistry   . Other nonspecific abnormal serum enzyme levels   . Malignant neoplasm of kidney, except pelvis   . Calculus of kidney   . Coronary atherosclerosis of unspecified type of vessel, native or graft   . Impotence of organic origin   . Tobacco use disorder   . Pure hypercholesterolemia   . Internal hemorrhoids without mention of complication   . Melanoma 06/2010    Left arm    Past Surgical History  Procedure Laterality Date  . Kidney surgery    . Cardiac catheterization    . Angioplasty    . Melanoma excision    . Colonoscopy  10/05/2008    single polyp, IH.  Nicholas Douglas.  Repeat in 3 years.  . Carotid dopplers  03/08/2011    minimal plaque formation B. Symptoms: dizziness.  . Cardiolite  05/06/2011    low risk study; normal EF.  SE H&V.    Allergies  Allergen Reactions  . Morphine And Related Hives    Current Outpatient Prescriptions  Medication Sig Dispense Refill  . amLODipine (NORVASC) 10 MG tablet Take 10 mg by mouth daily.      Marland Kitchen aspirin 81 MG tablet Take 81 mg by mouth daily.      Marland Kitchen atenolol (TENORMIN) 25 MG tablet Take 0.5 tablets (12.5 mg total) by mouth 2 (two) times daily.  90 tablet  3  . clopidogrel (PLAVIX) 75 MG tablet Take 1 tablet (75 mg total) by mouth daily.  90 tablet  3  . ezetimibe (ZETIA) 10 MG tablet Take 10 mg by mouth daily.      . finasteride (PROSCAR) 5 MG tablet Take 0.5 tablets by mouth daily.      . nitroGLYCERIN (NITROSTAT) 0.4 MG SL tablet Place 1 tablet (0.4 mg total) under the tongue every 5 (five) minutes as needed for chest pain.  25 tablet  12  . omega-3 acid ethyl esters (LOVAZA) 1 G capsule Take 1 g by mouth 2 (two) times daily.      . Pitavastatin Calcium 4 MG TABS Take by mouth daily. Take 1/2 tablet daily      . valsartan (DIOVAN) 160 MG tablet Take 160 mg by mouth daily. Take 1/2 tablet daily      .  isosorbide mononitrate (IMDUR) 30 MG 24 hr tablet Take 1 tablet (30 mg total) by mouth daily.  30 tablet  3   No current facility-administered medications for this visit.    Socially he remains active. He is married has 5 children 10 grandchildren 2 great-grandchildren. Is no alcohol use. He typically scores below 75 and plays golf 4 days per week.  ROS is negative for fevers, chills or night sweats.He denies visual symptoms. He denies change in hearing. There is no lymphadenopathy There is no cough He denies wheezing; he denies palpitations. There is no presyncope or syncope. He still has noticed some mild chest discomfort. He denies abdominal pain since his appendectomy. He does admit to increased  gas and dyspepsia. There is no nausea vomiting or diarrhea. There is no blood in stool or urine. He denies paresthesias. He denies claudication. There are no myalgias. He denies edema. He denies cold or heat intolerance. Other comprehensive 14 point system review is negative.  PE BP 132/78  Ht 5' 7.5" (1.715 m)  Wt 194 lb 4.8 oz (88.134 kg)  BMI 29.97 kg/m2  General: Alert, oriented, no distress.  Skin: normal turgor, no rashes HEENT: Normocephalic, atraumatic. Pupils round and reactive; sclera anicteric;no lid lag.  Nose without nasal septal hypertrophy Mouth/Parynx benign; Mallinpatti scale 2 Neck: No JVD, no carotid bruits; normal carotid up stroke Lungs: clear to ausculatation and percussion; no wheezing or rales Chest wall: Nontender to palpation the Heart: RRR, s1 s2 normal 1/6 sem; no diastolic murmur. No rubs thrills or heaves. Abdomen: soft, nontender; no hepatosplenomehaly, BS+; abdominal aorta nontender and not dilated by palpation. Back: No CVA tenderness. Pulses 2+ Extremities: no clubbing cyanosis or edema, Homan's sign negative  Neurologic: grossly nonfocal  ECG (independently read by me): Sinus bradycardia 55 beats per minute. Nonspecific ST changes  03/01/2013 ECG  (independently read by me): Sinus bradycardia 52 beats per minute. Normal intervals. No significant ST changes.  Prior ECG of 11/20/2012: Sinus rhythm at 51 beats per minute. QTc interval 400 ms. No significant ST changes.  LABS:  BMET    Component Value Date/Time   NA 143 04/23/2013 0810   NA 139 04/07/2013 0530   K 5.0 04/23/2013 0810   CL 102 04/23/2013 0810   CO2 23 04/23/2013 0810   GLUCOSE 102* 04/23/2013 0810   GLUCOSE 92 04/07/2013 0530   BUN 20 04/23/2013 0810   BUN 16 04/07/2013 0530   CREATININE 1.27 04/23/2013 0810   CALCIUM 9.8 04/23/2013 0810   GFRNONAA 53* 04/23/2013 0810   GFRAA 61 04/23/2013 0810     Hepatic Function Panel     Component Value Date/Time   PROT 6.4 04/06/2013 0334   PROT 7.1 02/22/2013 0804   ALBUMIN 3.0* 04/06/2013 0334   AST 57* 04/06/2013 0334   ALT 59* 04/06/2013 0334   ALKPHOS 46 04/06/2013 0334   BILITOT 0.6 04/06/2013 0334     CBC    Component Value Date/Time   WBC 6.2 04/23/2013 0810   WBC 7.4 04/07/2013 0530   RBC 5.29 04/23/2013 0810   RBC 4.95 04/07/2013 0530   HGB 15.2 04/23/2013 0810   HCT 46.7 04/23/2013 0810   PLT 267 04/23/2013 0810   MCV 88 04/23/2013 0810   MCH 28.7 04/23/2013 0810   MCH 29.5 04/07/2013 0530   MCHC 32.5 04/23/2013 0810   MCHC 34.4 04/07/2013 0530   RDW 13.2 04/23/2013 0810   RDW 12.8 04/07/2013 0530   LYMPHSABS 1.8 05/08/2011 1354   MONOABS 1.0 05/08/2011 1354   EOSABS 0.3 05/08/2011 1354   BASOSABS 0.0 05/08/2011 1354     BNP    Component Value Date/Time   PROBNP 43.0 01/08/2010 1642    Lipid Panel     Component Value Date/Time   CHOL 139 04/06/2013 0334     RADIOLOGY: Dg Chest 2 View  08/17/2012   *RADIOLOGY REPORT*  Clinical Data: Renal cell carcinoma  CHEST - 2 VIEW  Comparison: 05/08/11  Findings: The cardiomediastinal silhouette is stable.  No acute infiltrate or pleural effusion.  No pulmonary edema.  Bony thorax is unremarkable.  IMPRESSION: No active disease.  No significant change.   Original Report Authenticated By: Lahoma Crocker, M.D.  ASSESSMENT AND PLAN: Mr. Kimball has established coronary artery disease dating back to 78 and has undergone multiple interventions in the past over a 10 year period from 23 to 1997. A nuclear perfusion study in April 2013 which remained stable. 3 weeks ago, he developed unstable angina symptomatology and catheterization done initially at [facility] showed multivessel disease. Options were discussed included bypass surgery versus intervention. Due to the significant calcification he underwent initial high-speed rotational laparotomy of his proximal to mid LAD lesions. He does have a distal left circumflex stenosis. 2 his one remaining kidney and to reduce the risk of potential contrast nephropathy stage intervention to circumflex artery was deferred. Since his intervention his symptomatology has improved but he still experiences both some episodes of chest tightness and also episodes of gas and dyspepsia. I recommended the addition of simethicone well as protonic 40 mg daily. Also adding low dose isosorbide mononitrate at 30 mg. We will schedule him for elective left circumflex PCI within the next week. We discussed the risks benefits of the procedure. With reference to his hyperlipidemia,  His lipid profile is markedly improved with an LDL particle number at goal. In the past he did develop mild transaminase elevations with statins. He is tolerating the reduced dose of Livalo 2 mg and  Zetia 10 mg as prescribed. Also discussed with him recent to Improve-it trial data. His renal function at discharge on 04/07/2013 was stable with a creatinine of 1.02 and he is tolerating the resumption of valsartan 80 mg daily, atenolol 12.5 mg twice a day in addition to his amlodipine 10 mg daily for blood pressure control. He is on dual antiplatelet therapy with low-dose aspirin and Plavix 75 mg daily.  [name], MD, [facility]  04/25/2013 10:37 AM

## 2013-04-27 NOTE — Progress Notes (Signed)
Site area: right groin  Site Prior to Removal:  Level 0  Pressure Applied For 20 MINUTES    Minutes Beginning at 1325  Manual:   yes  Patient Status During Pull:  stable  Post Pull Groin Site:  Level 0  Post Pull Instructions Given:  yes  Post Pull Pulses Present:  yes  Dressing Applied:  yes  Comments:  Gauze dressing applied at 4492, rechecked at 1400 and 1415 with no change in assessment, right dorsalis pedis +2, dressing dry and intact, CSM's wnls.

## 2013-04-27 NOTE — CV Procedure (Signed)
Nicholas Douglas is a 80 y.o. male    [id number]  [id number] LOCATION:  FACILITY: [facility]  PHYSICIAN: [name], MD, [facility] [birth year]   DATE OF PROCEDURE:  04/27/2013    PERCUTANEOUS CORONARY INTERVENTION: HSRA/PTCA/STENT  Circumflex Vessel    HISTORY:    [name] is an 80 year old gentleman who is status post remote interventions in 1987, 1991, 1993, and in 1997. He had been stable but approximately 3 weeks ago developed unstable angina and at catheterization was found to have significant coronary calcification of his LAD and circumflex with high-grade LAD and circumflex disease with irregularity in the RCA. The following day he underwent high-speed rotational atherectomy and stenting of his proximal to mid LAD. He has only one kidney and for this reason planned staged left circumflex intervention was recommended after several weeks of his initial contrast load. He was seen in the office by me several days ago and still has symptoms due to his circumflex stenosis and is now scheduled to undergo this procedure.   PROCEDURE:  The patient was brought to the second floor [facility] cath lab in the postabsorptive state. He was  premedicated with Versed 2 mg and fentanyl 25 mcg initially. Due to the previous documentation of significant coronary calcification the decision was made to perform high speed rotational atherectomy and ultimate stenting. His right femoral artery was punctured anteriorly and a 7 French sheath was inserted into the right femoral artery in anticipation of needing to up grade the burr size. Due to resting bradycardia with heart rates in the low 50s, the right femoral vein was punctured and a 6 French sheath was inserted. A 5 French transvenous pacemaker was advanced to the right ventricle. A 6 French XB LAD 3.5 guide was used for the intervention. [facility] angiography confirmed an excellent result in the LAD previously stented segments. The left circumflex was  calcified and beyond the first marginal branch was 90% stenosis. There was 60% stenosis at the ostium of the first marginal branch with 50% in the mid marginal vessel and there was also a 60% stenosis in a distal OM 2 vessel. Angiomax bolus plus infusion was administered. The patient had received his Plavix already 75 mg but during the procedure received an additional 75 mg today. Following documentation of therapeutic anticoagulation, a Rotafloppy wire was advanced into the circumflex vessel. Initially, a 1.5 mm bur was used and several runs were made.  It was felt that there was still significant calcified segment and consequently this burr was removed by the Dynaglide technique and exchanged for a 1.75 mm burr. Several additional runs were made with this larger burr size with significant debulking of the calcified lesion. The pacemaker was utilized during the high-speed rotational atherectomy runs and was set at a heart rate of 50 beats per minute. A 2.5x20 mm over-the-wire Maverick balloon was then inserted and advanced to the distal circumflex. The Rotafloppy was removed and exchanged for a 300 cm per over-the-wire. Dilatation was done at all sites that had undergone rotational atherectomy at low level pressure with this balloon. A 2.5x28 mm Promus Premier DES stent was then inserted and positioned above the first marginal vessel to extend antegrade to a previous ectatic segment and the distal end of the stent extended into the AV groove circumflex proximal to the takeoff of the OM 2 vessel. The stent was dilated x2. Post-stent dilatation was done with a 2.5x20 mm [place] Emerge balloon up to approximately 2.6 mm. [facility] angiography  confirmed an excellent angiographic result. All catheters were removed and the patient. Angiomax was discontinued and the arterial and revenous sheaths were sutured in place with plans to remove 2 hours following Angiomax therapy. The patient left the catheterization laboratory pain-free  with stable hemodynamics.  HEMODYNAMICS:   Central Aorta: 101/53    ANGIOGRAPHY:  The left main coronary artery was a short normal vessel which bifurcated into the LAD and left circumflex system.  The LAD had the 2 previously placed tandem stents commencing at the ostium and extending to the mid segment. There was 50% narrowing in a diagonal branch beyond the stented segment and the mid distal LAD had the previously noted focal 60-70% stenosis which had not been intervened upon.  The left circumflex coronary artery had an area of ectasia with her the first marginal arose. He was 60% ostial narrowing and is marginal branch followed by 50% mid stenosis. The circumflex had 90% calcified stenosis immediately beyond this first marginal takeoff. There was also a 60% ostial narrowing in the second marginal branch. Following successful high-speed rotational atherectomy, PTCA, and ultimate stenting with a 2.5x28 mm Promus DES stent, postdilated to 2.6 mm, the circumflex stenoses was reduced to 0%. There was no change in the previously noted marginal stenosis.  IMPRESSION:  Widely patent previously placed proximal to mid LAD stents with previously noted 50% diagonal and 60-70% more distal LAD stenoses.  Successful high-speed rotational atherectomy/PTCA/DES stenting of a calcified 90% left circumflex stenosis with insertion of 2.5x28 mm Promus Premier DES stent, postdilated to 2.6 mm and the 90% stenosis being reduced to 0%.  Temporary pacemaker inserted for bradycardia and with need for temporary pacing during atherectomy.  [name], MD, [facility] 04/27/2013 6:18 PM

## 2013-04-28 DIAGNOSIS — I10 Essential (primary) hypertension: Secondary | ICD-10-CM

## 2013-04-28 DIAGNOSIS — E785 Hyperlipidemia, unspecified: Secondary | ICD-10-CM

## 2013-04-28 LAB — CBC
HCT: 39.2 % (ref 39.0–52.0)
Hemoglobin: 13.2 g/dL (ref 13.0–17.0)
MCH: 29.4 pg (ref 26.0–34.0)
MCHC: 33.7 g/dL (ref 30.0–36.0)
MCV: 87.3 fL (ref 78.0–100.0)
Platelets: 198 K/uL (ref 150–400)
RBC: 4.49 MIL/uL (ref 4.22–5.81)
RDW: 12.9 % (ref 11.5–15.5)
WBC: 7.1 K/uL (ref 4.0–10.5)

## 2013-04-28 LAB — BASIC METABOLIC PANEL
BUN: 17 mg/dL (ref 6–23)
CHLORIDE: 107 meq/L (ref 96–112)
CO2: 25 meq/L (ref 19–32)
Calcium: 8.6 mg/dL (ref 8.4–10.5)
Creatinine, Ser: 1.28 mg/dL (ref 0.50–1.35)
GFR calc non Af Amer: 51 mL/min — ABNORMAL LOW (ref >90)
GFR, EST AFRICAN AMERICAN: 59 mL/min — AB (ref >90)
Glucose, Bld: 101 mg/dL — ABNORMAL HIGH (ref 70–99)
Potassium: 4.2 mEq/L (ref 3.7–5.3)
Sodium: 141 mEq/L (ref 137–147)

## 2013-04-28 MED ORDER — AMLODIPINE BESYLATE 10 MG PO TABS
10.0000 mg | ORAL_TABLET | Freq: Every day | ORAL | Status: DC
  Filled 2013-04-28: qty 1

## 2013-04-28 MED ORDER — ATENOLOL 12.5 MG HALF TABLET
12.5000 mg | ORAL_TABLET | Freq: Two times a day (BID) | ORAL | Status: DC
  Administered 2013-04-28: 08:00:00 12.5 mg via ORAL
  Filled 2013-04-28 (×2): qty 1

## 2013-04-28 MED ORDER — ISOSORBIDE MONONITRATE ER 30 MG PO TB24
30.0000 mg | ORAL_TABLET | Freq: Every day | ORAL | Status: DC
  Administered 2013-04-28: 09:00:00 30 mg via ORAL

## 2013-04-28 MED FILL — Sodium Chloride IV Soln 0.9%: INTRAVENOUS | Qty: 50 | Status: AC

## 2013-04-28 NOTE — Progress Notes (Addendum)
    Subjective: The patient was up ambulating in the hall without difficulty.  No problem with right groin.   Objective: Vital signs in last 24 hours: Temp:  [97.4 F (36.3 C)-98.6 F (37 C)] 97.9 F (36.6 C) (03/25 0624) Pulse Rate:  [52-84] 65 (03/25 0624) Resp:  [16-20] 20 (03/25 0624) BP: (124-184)/(62-98) 163/75 mmHg (03/25 0624) SpO2:  [95 %-99 %] 96 % (03/25 0624) Weight:  [193 lb 9 oz (87.8 kg)] 193 lb 9 oz (87.8 kg) (03/25 0000) Last BM Date: 04/27/13  Intake/Output from previous day: 03/24 0701 - 03/25 0700 In: 2272.5 [P.O.:360; I.V.:1912.5] Out: 1600 [Urine:1600] Intake/Output this shift: Total I/O In: 550 [I.V.:550] Out: 900 [Urine:900]  Medications Current Facility-Administered Medications  Medication Dose Route Frequency Provider Last Rate Last Dose  . 0.9 %  sodium chloride infusion   Intravenous Continuous [name], MD      . 0.9 %  sodium chloride infusion   Intravenous Continuous [name], MD      . aspirin EC tablet 81 mg  81 mg Oral Daily [name], MD      . clopidogrel (PLAVIX) tablet 75 mg  75 mg Oral Q breakfast [name], MD      . hydrALAZINE (APRESOLINE) injection 10 mg  10 mg Intravenous Q2H PRN Evelene Croon Barrett, PA-C   10 mg at 04/27/13 1315  . nitroGLYCERIN 0.2 mg/mL in dextrose 5 % infusion  2-200 mcg/min Intravenous Titrated Lonn Georgia, PA-C        PE: General appearance: alert, cooperative and no distress Lungs: Mild basilar crackles.  No wheeze Heart: regular rate and rhythm and 1/6 sys MM LSB Extremities: No LEE Pulses: 2+ and symmetric Skin: Warm and dry.  Very small (~1.0CM) hematoma in the right groin.  Nontender.   Neurologic: Grossly normal  Lab Results:   Recent Labs  04/28/13 0305  WBC 7.1  HGB 13.2  HCT 39.2  PLT 198   BMET  Recent Labs  04/28/13 0305  NA 141  K 4.2  CL 107  CO2 25  GLUCOSE 101*  BUN 17  CREATININE 1.28  CALCIUM 8.6     Assessment/Plan  Active Problems:  Essential hypertension, benign   Coronary artery disease   Hyperlipidemia  Plan:   Ambulating in the hall with complaint.  Maintaining SR on tele.  SP LHC revealing a widely patent previously placed proximal to mid LAD stents with previously noted 50% diagonal and 60-70% more distal LAD stenoses.  Successful high-speed rotational atherectomy/PTCA/DES stenting of a calcified 90% left circumflex stenosis with insertion of 2.5x28 mm Promus Premier DES stent, postdilated to 2.6 mm and the 90% stenosis being reduced to 0%.  His BP is a little high but none of his home meds were ordered.  Will give amlodipine and atenolol now.  DC home today.  Follow up with Dr. [name].   LOS: 1 day    HAGER, BRYAN PA-C 04/28/2013 6:49 AM   Patient seen and examined. Agree with assessment and plan. Doing well. Ambulating; no cp or sob. Maintaining NSR at 63. DC today. Resume home meds. F/U office.   [name], MD, [facility] 04/28/2013 7:50 AM

## 2013-04-28 NOTE — Progress Notes (Signed)
Pt walking independently without problems. Sts his chest tightness is gone. Reviewed ed esp ex gl since pt has avoided ex for last couple of weeks. Good understanding. Not interested in CRPII.  Warren, ACSM 8:13 AM. 04/28/2013

## 2013-04-28 NOTE — Discharge Summary (Signed)
Physician Discharge Summary     Patient ID: Nicholas Douglas MRN: [id number] DOB/AGE: 1931/02/25 80 y.o. Cardiologist:  Nicholas Douglas  Admit date: 04/27/2013 Discharge date: 04/28/2013  Admission Diagnoses: CAD  Discharge Diagnoses:  Active Problems:   Essential hypertension, benign   Coronary artery disease   Hyperlipidemia   Discharged Condition: stable  Hospital Course:   [name] is a 80 year old gentleman who is status post remote interventions in 1987, 1991, 1993, and in 1997. He had been stable but approximately 3 weeks ago developed unstable angina and at catheterization was found to have significant coronary calcification of his LAD and circumflex with high-grade LAD and circumflex disease with irregularity in the RCA. The following day he underwent high-speed rotational atherectomy and stenting of his proximal to mid LAD. He has only one kidney and for this reason planned staged left circumflex intervention was recommended after several weeks of his initial contrast load. He was seen in the office by me several days ago and still has symptoms due to his circumflex stenosis and is now scheduled to undergo this procedure.  He underwent successful high-speed rotational atherectomy/PTCA/DES stenting of a calcified 90% left circumflex stenosis with insertion of 2.5x28 mm Promus Premier DES stent, postdilated to 2.6 mm and the 90% stenosis being reduced to 0%.  Temporary pacemaker was inserted for bradycardia and with need for temporary pacing during atherectomy.  He did well post procedure and was ambulating the following morning without difficulty.  BP was elevated in the morning.  Home dosing of atenolol and Imdur were given.  The patient was seen by Dr. Claiborne Douglas who felt he was stable for DC home.     Consults: None  Significant Diagnostic Studies:  LHC and PCI PROCEDURE:  The patient was brought to the second floor [facility] cath lab in the postabsorptive state. He was  premedicated with Versed 2 mg and fentanyl 25 mcg initially. Due to the previous documentation of significant coronary calcification the decision was made to perform high speed rotational atherectomy and ultimate stenting. His right femoral artery was punctured anteriorly and a 7 French sheath was inserted into the right femoral artery in anticipation of needing to up grade the burr size. Due to resting bradycardia with heart rates in the low 50s, the right femoral vein was punctured and a 6 French sheath was inserted. A 5 French transvenous pacemaker was advanced to the right ventricle. A 6 French XB LAD 3.5 guide was used for the intervention. Scout angiography confirmed an excellent result in the LAD previously stented segments. The left circumflex was calcified and beyond the first marginal branch was 90% stenosis. There was 60% stenosis at the ostium of the first marginal branch with 50% in the mid marginal vessel and there was also a 60% stenosis in a distal OM 2 vessel. Angiomax bolus plus infusion was administered. The patient had received his Plavix already 75 mg but during the procedure received an additional 75 mg today. Following documentation of therapeutic anticoagulation, a Rotafloppy wire was advanced into the circumflex vessel. Initially, a 1.5 mm bur was used and several runs were made. It was felt that there was still significant calcified segment and consequently this burr was removed by the Dynaglide technique and exchanged for a 1.75 mm burr. Several additional runs were made with this larger burr size with significant debulking of the calcified lesion. The pacemaker was utilized during the high-speed rotational atherectomy runs and was set at a heart rate of 50  beats per minute. A 2.5x20 mm over-the-wire Maver[place] balloon was then inserted and advanced to the distal circumflex. The Rotafloppy was removed and exchanged for a 300 cm per over-the-wire. Dilatation was done at all sites that had  undergone rotational atherectomy at low level pressure with this balloon. A 2.5x28 mm Promus Premier DES stent was then inserted and positioned above the first marginal vessel to extend antegrade to a previous ectatic segment and the distal end of the stent extended into the AV groove circumflex proximal to the takeoff of the OM 2 vessel. The stent was dilated x2. Post-stent dilatation was done with a 2.5x20 mm [place] Emerge balloon up to approximately 2.6 mm. Scout angiography confirmed an excellent angiographic result. All catheters were removed and the patient. Angiomax was discontinued and the arterial and revenous sheaths were sutured in place with plans to remove 2 hours following Angiomax therapy. The patient left the catheterization laboratory pain-free with stable hemodynamics.  HEMODYNAMICS:  Central Aorta: 101/53  ANGIOGRAPHY:  The left main coronary artery was a short normal vessel which bifurcated into the LAD and left circumflex system.  The LAD had the 2 previously placed tandem stents commencing at the ostium and extending to the mid segment. There was 50% narrowing in a diagonal branch beyond the stented segment and the mid distal LAD had the previously noted focal 60-70% stenosis which had not been intervened upon.  The left circumflex coronary artery had an area of ectasia with her the first marginal arose. He was 60% ostial narrowing and is marginal branch followed by 50% mid stenosis. The circumflex had 90% calcified stenosis immediately beyond this first marginal takeoff. There was also a 60% ostial narrowing in the second marginal branch. Following successful high-speed rotational atherectomy, PTCA, and ultimate stenting with a 2.5x28 mm Promus DES stent, postdilated to 2.6 mm, the circumflex stenoses was reduced to 0%. There was no change in the previously noted marginal stenosis.  IMPRESSION:  Widely patent previously placed proximal to mid LAD stents with previously noted 50% diagonal  and 60-70% more distal LAD stenoses.  Successful high-speed rotational atherectomy/PTCA/DES stenting of a calcified 90% left circumflex stenosis with insertion of 2.5x28 mm Promus Premier DES stent, postdilated to 2.6 mm and the 90% stenosis being reduced to 0%.  Temporary pacemaker inserted for bradycardia and with need for temporary pacing during atherectomy.  [name], MD, [facility]  04/27/2013  6:18 PM   Treatments: See above  Discharge Exam: Blood pressure 169/77, pulse 61, temperature 98.1 F (36.7 C), temperature source Oral, resp. rate 18, height 5' 7.5" (1.715 m), weight 193 lb 9 oz (87.8 kg), SpO2 97.00%.   Disposition: 01-Home or Self Care  Discharge Orders   Future Orders Complete By Expires   Diet - low sodium heart healthy  As directed    Discharge instructions  As directed    Comments:     No lifting more than a half gallon of milk or driving for three days.   Increase activity slowly  As directed        Medication List         amLODipine 10 MG tablet  Commonly known as:  NORVASC  Take 10 mg by mouth daily.     aspirin 81 MG tablet  Take 81 mg by mouth daily.     atenolol 25 MG tablet  Commonly known as:  TENORMIN  Take 12.5 mg by mouth 2 (two) times daily.     clopidogrel 75 MG  tablet  Commonly known as:  PLAVIX  Take 75 mg by mouth daily.     ezetimibe 10 MG tablet  Commonly known as:  ZETIA  Take 10 mg by mouth daily.     finasteride 5 MG tablet  Commonly known as:  PROSCAR  Take 2.5 tablets by mouth daily.     isosorbide mononitrate 30 MG 24 hr tablet  Commonly known as:  IMDUR  Take 30 mg by mouth daily.     LIVALO 2 MG Tabs  Generic drug:  Pitavastatin Calcium  Take 2 mg by mouth.     nitroGLYCERIN 0.4 MG SL tablet  Commonly known as:  NITROSTAT  Place 1 tablet (0.4 mg total) under the tongue every 5 (five) minutes as needed for chest pain.     omega-3 acid ethyl esters 1 G capsule  Commonly known as:  LOVAZA  Take 1 g by mouth  daily.     valsartan 80 MG tablet  Commonly known as:  DIOVAN  Take 80 mg by mouth daily.           Follow-up Information   Follow up with [name], MD. (The office scheduler will call you with the appt date and time.  It will most likely be with no of the PA's. )    Specialty:  Cardiology   Contact information:   [address] [place] [address] [phone]       Signed: Tarri Fuller 04/28/2013, 8:57 AM

## 2013-05-03 ENCOUNTER — Encounter: Payer: Self-pay | Admitting: Cardiovascular Disease

## 2013-05-12 ENCOUNTER — Ambulatory Visit (INDEPENDENT_AMBULATORY_CARE_PROVIDER_SITE_OTHER): Payer: Medicare Other | Admitting: Cardiology

## 2013-05-12 ENCOUNTER — Encounter: Payer: Self-pay | Admitting: Cardiology

## 2013-05-12 VITALS — BP 124/70 | HR 56 | Ht 67.0 in | Wt 194.0 lb

## 2013-05-12 DIAGNOSIS — Q602 Renal agenesis, unspecified: Secondary | ICD-10-CM

## 2013-05-12 DIAGNOSIS — J40 Bronchitis, not specified as acute or chronic: Secondary | ICD-10-CM

## 2013-05-12 DIAGNOSIS — N183 Chronic kidney disease, stage 3 unspecified: Secondary | ICD-10-CM

## 2013-05-12 DIAGNOSIS — Z905 Acquired absence of kidney: Secondary | ICD-10-CM

## 2013-05-12 DIAGNOSIS — Q600 Renal agenesis, unilateral: Secondary | ICD-10-CM

## 2013-05-12 DIAGNOSIS — C649 Malignant neoplasm of unspecified kidney, except renal pelvis: Secondary | ICD-10-CM

## 2013-05-12 DIAGNOSIS — Q605 Renal hypoplasia, unspecified: Secondary | ICD-10-CM

## 2013-05-12 DIAGNOSIS — I200 Unstable angina: Secondary | ICD-10-CM

## 2013-05-12 DIAGNOSIS — I2510 Atherosclerotic heart disease of native coronary artery without angina pectoris: Secondary | ICD-10-CM

## 2013-05-12 LAB — BASIC METABOLIC PANEL WITH GFR
BUN: 18 mg/dL (ref 6–23)
CO2: 30 meq/L (ref 19–32)
CREATININE: 1.1 mg/dL (ref 0.50–1.35)
Calcium: 9.7 mg/dL (ref 8.4–10.5)
Chloride: 103 mEq/L (ref 96–112)
GFR, Est African American: 73 mL/min
GFR, Est Non African American: 63 mL/min
GLUCOSE: 101 mg/dL — AB (ref 70–99)
Potassium: 4.4 mEq/L (ref 3.5–5.3)
Sodium: 139 mEq/L (ref 135–145)

## 2013-05-12 MED ORDER — CEPHALEXIN 500 MG PO CAPS: 500.0000 mg | ORAL_CAPSULE | Freq: Four times a day (QID) | ORAL | Status: DC

## 2013-05-12 NOTE — Progress Notes (Signed)
05/14/2013   PCP: Marcello Fennel, MD   Chief Complaint  Patient presents with  . Follow-up    S/P Hospital visit    Primary Cardiologist: Dr. [name]  HPI: Mr. Geibel has known CAD and underwent numerous interventions dating back to 31, 1991, 1993, and in 1997. Cardiac catheterization in 2011 which showed preserved LV function with mild residual distal inferior apical hypocontractility. There is evidence for coronary calcification with segmental narrowing of his LAD of 30-40% proximally, 50% diffusely, in the midsegment, and 70-80% in the distal region, he had AV groove circumflex stenoses of 70 and 50% with a 40% OM 2 stenosis, and a 30-40 and 50% RCA stenoses with 80-90% stenosis in the acute marginal branch. He had been on medical therapy.  He recently presented to [facility] with class IV angina and catheterization demonstrated severe 2 vessel CAD with significant current calcification of the LAD with up to 95% mid LAD stenosis and diffuse proximal stenosis. He underwent successful high-speed rotational atherectomy the following day by Dr. [place] and a 1.5 mm bur was used. Mid LAD was stented with a 2.75x38 mm Promus stent in the proximal LAD was stented with a 3.0x28 mm Promus stent. Medical therapy was recommended for his distal LAD stenosis. He has only one kidney. He is in need for staged intervention to the left circumflex coronary artery. He presents now for followup evaluation and to reschedule that staged intervention. His chest pain has significantly improved. However, he still experiences episodes of chest discomfort. He is unaware of palpitations. He denies presyncope or syncope.  It was arranged for him to undergo elective high-speed rotational atherectomy with a drug-eluting stent calcified 90% left circumflex stenosis with a Promus premier drug-eluting stent. He did have temporary pacemaker inserted for bradycardia during the procedure. He did well the next day  ambulating without problems and he was discharged home.  He is here today for followup.  He has no chest pain no shortness of breath no complaints at all, from cardiac standpoint. He does complain of cough.  The cough is productive with white mucus. His wife states he is also wheezing at night.     Allergies  Allergen Reactions  . Morphine And Related Hives    Current Outpatient Prescriptions  Medication Sig Dispense Refill  . amLODipine (NORVASC) 10 MG tablet Take 10 mg by mouth daily.      Marland Kitchen aspirin 81 MG tablet Take 81 mg by mouth daily.      Marland Kitchen atenolol (TENORMIN) 25 MG tablet Take 12.5 mg by mouth 2 (two) times daily.      . clopidogrel (PLAVIX) 75 MG tablet Take 75 mg by mouth daily.      Marland Kitchen ezetimibe (ZETIA) 10 MG tablet Take 10 mg by mouth daily.      . finasteride (PROSCAR) 5 MG tablet Take 2.5 tablets by mouth daily.       . isosorbide mononitrate (IMDUR) 30 MG 24 hr tablet Take 30 mg by mouth daily.      . nitroGLYCERIN (NITROSTAT) 0.4 MG SL tablet Place 1 tablet (0.4 mg total) under the tongue every 5 (five) minutes as needed for chest pain.  25 tablet  12  . omega-3 acid ethyl esters (LOVAZA) 1 G capsule Take 1 g by mouth daily.       . Pitavastatin Calcium (LIVALO) 2 MG TABS Take 2 mg by mouth.      . valsartan (  DIOVAN) 80 MG tablet Take 80 mg by mouth daily.      . cephALEXin (KEFLEX) 500 MG capsule Take 1 capsule (500 mg total) by mouth 4 (four) times daily.  20 capsule  0   No current facility-administered medications for this visit.    Past Medical History  Diagnosis Date  . Hypertension   . Personal history of colonic polyps   . Other abnormal blood chemistry   . Other nonspecific abnormal serum enzyme levels   . Coronary atherosclerosis of unspecified type of vessel, native or graft   . Impotence of organic origin   . Tobacco use disorder   . Pure hypercholesterolemia   . Internal hemorrhoids without mention of complication   . Melanoma 06/2010    Left arm    . Myocardial infarction 1976  . Unspecified congenital cystic kidney disease   . Malignant neoplasm of kidney, except pelvis   . Calculus of kidney     "that's how they found the cancer" (04/27/2013)    Past Surgical History  Procedure Laterality Date  . Nephrectomy Left 2006  . Melanoma excision Left ~ 2007    "arm"  . Colonoscopy  10/05/2008    single polyp, IH.  [name].  Repeat in 3 years.  . Carotid dopplers  03/08/2011    minimal plaque formation B. Symptoms: dizziness.  . Cardiolite  05/06/2011    low risk study; normal EF.  SE H&V.  Marland Kitchen Coronary angioplasty      "I've had 4" (04/27/2013)  . Coronary angioplasty with stent placement  04/2013; 04/27/2013    "2 + 1" (04/27/2013)  . Appendectomy  2014    HDQ:QIWLNLG:XQ colds or fevers, no weight changes Skin:no rashes or ulcers HEENT:no blurred vision, no congestion CV:see HPI PUL:see HPI GI:no diarrhea constipation or melena, no indigestion GU:no hematuria, no dysuria MS:no joint pain, no claudication Neuro:no syncope, no lightheadedness Endo:no diabetes, no thyroid disease  PHYSICAL EXAM BP 124/70  Pulse 56  Ht 5\' 7"  (1.702 m)  Wt 194 lb (87.998 kg)  BMI 30.38 kg/m2 General:Pleasant affect, NAD Skin:Warm and dry, brisk capillary refill HEENT:normocephalic, sclera clear, mucus membranes moist Neck:supple, no JVD, no bruits  Heart:S1S2 RRR with soft systolic murmur,  No gallup, rub or click Lungs:without rales,+ rhonchi Rt lower base , no wheezes JJH:ERDE, non tender, + BS, do not palpate liver spleen or masses Ext:no lower ext edema, 2+ pedal pulses, 2+ radial pulses Neuro:alert and oriented, MAE, follows commands, + facial symmetry  YCX:KGYJE Nicholas Douglas, non specific t wave abnormality no acute changes.  ASSESSMENT AND PLAN CAD in native artery History of multiple interventions in the past most recently atherectomy and stent to the mid LAD with a Promus stent.  Staged intervention of left circumflex with a Promus  premier drug-eluting stent as well.  He has had no further chest pain or discomfort his right radial cath site is healing as well as his right groin.  He'll need to continue dual antiplatelet therapy with aspirin and Plavix for at least one year.  Patient wanted to come off the Imdur or until now be fine if he has recurrent chest pain we'll resume but we will stop it for now.  Renal cell carcinoma Patient with only right kidney after left nephrectomy was done for renal cancer. We'll check basic metabolic panel today to ensure his kidney function is stable.  Unstable angina Resolved.  Bronchitis Keflex 500 mg 1 by mouth 3 times a day for 5 days was  given.

## 2013-05-12 NOTE — Patient Instructions (Signed)
Ok to stop Imdur ( isosorbide) if you have more chest tightness you may resume.  I ordered Keflex for your bronchitis.  Follow up with Dr. [name] in 5 weeks.  Have lab work done.

## 2013-05-14 DIAGNOSIS — J40 Bronchitis, not specified as acute or chronic: Secondary | ICD-10-CM | POA: Insufficient documentation

## 2013-05-14 NOTE — Assessment & Plan Note (Addendum)
History of multiple interventions in the past most recently atherectomy and stent to the mid LAD with a Promus stent.  Staged intervention of left circumflex with a Promus premier drug-eluting stent as well.  He has had no further chest pain or discomfort his right radial cath site is healing as well as his right groin.  He'll need to continue dual antiplatelet therapy with aspirin and Plavix for at least one year.  Patient wanted to come off the Imdur or until now be fine if he has recurrent chest pain we'll resume but we will stop it for now.

## 2013-05-14 NOTE — Assessment & Plan Note (Signed)
Patient with only right kidney after left nephrectomy was done for renal cancer. We'll check basic metabolic panel today to ensure his kidney function is stable.

## 2013-05-14 NOTE — Assessment & Plan Note (Signed)
Resolved

## 2013-05-14 NOTE — Assessment & Plan Note (Signed)
Keflex 500 mg 1 by mouth 3 times a day for 5 days was given.

## 2013-06-14 ENCOUNTER — Telehealth: Payer: Self-pay | Admitting: Cardiovascular Disease

## 2013-06-22 ENCOUNTER — Ambulatory Visit (INDEPENDENT_AMBULATORY_CARE_PROVIDER_SITE_OTHER): Payer: Medicare Other | Admitting: Cardiovascular Disease

## 2013-06-22 ENCOUNTER — Encounter: Payer: Self-pay | Admitting: Cardiovascular Disease

## 2013-06-22 VITALS — BP 148/90 | HR 54 | Ht 67.5 in | Wt 195.2 lb

## 2013-06-22 DIAGNOSIS — E785 Hyperlipidemia, unspecified: Secondary | ICD-10-CM

## 2013-06-22 DIAGNOSIS — I10 Essential (primary) hypertension: Secondary | ICD-10-CM

## 2013-06-22 DIAGNOSIS — I2510 Atherosclerotic heart disease of native coronary artery without angina pectoris: Secondary | ICD-10-CM

## 2013-06-22 DIAGNOSIS — C649 Malignant neoplasm of unspecified kidney, except renal pelvis: Secondary | ICD-10-CM

## 2013-06-22 MED ORDER — OMEGA-3-ACID ETHYL ESTERS 1 G PO CAPS: 2.0000 g | ORAL_CAPSULE | Freq: Every day | ORAL | Status: DC

## 2013-06-22 NOTE — Patient Instructions (Signed)
Your physician recommends that you schedule a follow-up appointment in: 6 months  

## 2013-06-24 NOTE — Telephone Encounter (Signed)
Closed encounter °

## 2013-06-25 ENCOUNTER — Encounter: Payer: Self-pay | Admitting: Cardiovascular Disease

## 2013-06-25 NOTE — Progress Notes (Signed)
Patient ID: JEHAD BISONO, male   DOB: 30-Sep-1931, 80 y.o.   MRN: [id number]        HPI: TYRICK DUNAGAN a 80 y.o. male who presents to the office today for follow-up evaluation.  Mr. Matlack has known CAD and underwent numerous interventions dating back to 84, 1991, 1993, and in 1997. Cardiac catheterization in 2011 which showed preserved LV function with mild residual distal inferior apical hypocontractility. There is evidence for coronary calcification with segmental narrowing of his LAD of 30-40% proximally, 50% diffusely, in the midsegment, and 70-80% in the distal region, he had AV groove circumflex stenoses of 70 and 50% with a 40% OM 2 stenosis, and a 30-40 and 50% RCA stenoses with 80-90% stenosis in the acute marginal branch. He had been on medical therapy.  He  presented to [facility] with class IV angina and catheterization demonstrated severe 2 vessel CAD with significant current calcification of the LAD with up to 95% mid LAD stenosis and diffuse proximal stenosis. He underwent successful high-speed rotational atherectomy the following day by Dr. Martinique and a 1.5 mm bur was used. Mid LAD was stented with a 2.75x38 mm Promus stent in the proximal LAD was stented with a 3.0x28 mm Promus stent. Medical therapy was recommended for his distal LAD stenosis. He has only one kidney. He is in need for staged intervention to the left circumflex coronary artery. He presents now for followup evaluation and to reschedule that staged intervention. His chest pain has significantly improved. However, he still experiences episodes of chest discomfort. He is unaware of palpitations. He denies presyncope or syncope.  He also has a history of significant hyperlipidemia and has had significant increased number of small LDL particles and insulin resistance. This seemed to markedly improve with the addition of Niaspan added to zetia and lovaza.  Remotely, he had been on statins consisting of Lipitor and  subsequently Crestor. He did have transient LFT elevation. He also concerns of possible risk of developing dementia with statin therapy.  He had  some episodes of Niaspan induced diffuse flushing. He wanted to stop taking his Niaspan. He did have subsequent NMR off Niaspan and done just on Zetia and as well as 2 g of Lovaza.  This showed increased abnormalities such that his total cholesterol was 201, LDL had risen to 124, but he now had LDL small particles which have increased from 685 to 1348. When I last saw him, we elected to try little low and ultimately titrated this to 4 mg daily to take in addition to his Zetia 10 mg per apparently in late July he developed abdominal discomfort and was found to have acute appendicitis. On CT imaging he was also noted to have prostate enlargement with nodularity and thickening of the bladder base and cystoscopy was suggested for further evaluation. He is status post left nephrectomy. He also was noted to have a 2.3 cm infrarenal suprailiac abdominal aortic aneurysm.  Laboratory on 10/26/2012: LDL particle number was elevated at 1657, LDL cholesterol 112 triglycerides 155 and total cholesterol 190. He continued to be insulin resistant with insulin resistance scored 65.  Of note, serum creatinine has risen to 1.33 and his estimated GFR was approximately 50 states his blood pressure at home tends to be relatively low typically in the 110s to less than 130.   Laboratory on 02/22/2013: LDL particle number was now markedly improved at 774 with an HDL of 40 calculated LDL 50 small LDL particle #417. Insulin resistance  score was also improved at 51. He did have very minimal transaminase elevation with an AST of 45 and an ALT of 58. Potassium was 5.4. He does admit to recently eating foods with increased potassium source. Creatinine was 1.16  Since I last saw him in the office in March, he underwent staged intervention to his left circumflex coronary artery, which was  significantly calcified.  He underwent successful high-speed rotational atherectomy with a 1.5 and 1.75 mm burr, and ultimately had a 2.5x28 mm Promus premier DES stent inserted into the circumflex vessel, which was post dilated to approximately 2.6 mm.  Subsequent, he has felt significantly improved.  He specifically denies any recurrent chest pain symptoms.  He does note more energy.  He presents for followup.  Cardiology evaluation.   Past Medical History  Diagnosis Date  . Hypertension   . Personal history of colonic polyps   . Other abnormal blood chemistry   . Other nonspecific abnormal serum enzyme levels   . Coronary atherosclerosis of unspecified type of vessel, native or graft   . Impotence of organic origin   . Tobacco use disorder   . Pure hypercholesterolemia   . Internal hemorrhoids without mention of complication   . Melanoma 06/2010    Left arm  . Myocardial infarction 1976  . Unspecified congenital cystic kidney disease   . Malignant neoplasm of kidney, except pelvis   . Calculus of kidney     "that's how they found the cancer" (04/27/2013)    Past Surgical History  Procedure Laterality Date  . Nephrectomy Left 2006  . Melanoma excision Left ~ 2007    "arm"  . Colonoscopy  10/05/2008    single polyp, IH.  [name].  Repeat in 3 years.  . Carotid dopplers  03/08/2011    minimal plaque formation B. Symptoms: dizziness.  . Cardiolite  05/06/2011    low risk study; normal EF.  SE H&V.  Marland Kitchen Coronary angioplasty      "I've had 4" (04/27/2013)  . Coronary angioplasty with stent placement  04/2013; 04/27/2013    "2 + 1" (04/27/2013)  . Appendectomy  2014    Allergies  Allergen Reactions  . Morphine And Related Hives    Current Outpatient Prescriptions  Medication Sig Dispense Refill  . amLODipine (NORVASC) 10 MG tablet Take 10 mg by mouth daily.      Marland Kitchen aspirin 81 MG tablet Take 81 mg by mouth daily.      Marland Kitchen atenolol (TENORMIN) 25 MG tablet Take 12.5 mg by mouth 2 (two)  times daily.      . clopidogrel (PLAVIX) 75 MG tablet Take 75 mg by mouth daily.      Marland Kitchen ezetimibe (ZETIA) 10 MG tablet Take 10 mg by mouth daily.      . finasteride (PROSCAR) 5 MG tablet Take 2.5 tablets by mouth daily.       . nitroGLYCERIN (NITROSTAT) 0.4 MG SL tablet Place 1 tablet (0.4 mg total) under the tongue every 5 (five) minutes as needed for chest pain.  25 tablet  12  . omega-3 acid ethyl esters (LOVAZA) 1 G capsule Take 2 capsules (2 g total) by mouth daily.  180 capsule  3  . Pitavastatin Calcium (LIVALO) 2 MG TABS Take 2 mg by mouth.      . valsartan (DIOVAN) 80 MG tablet Take 80 mg by mouth daily.       No current facility-administered medications for this visit.    Socially he remains  active. He is married has 5 children 10 grandchildren 2 great-grandchildren. Is no alcohol use. He typically scores below 75 and plays golf 4 days per week.  ROS General: Negative; No fevers, chills, or night sweats;  HEENT: Negative; No changes in vision or hearing, sinus congestion, difficulty swallowing Pulmonary: Negative; No cough, wheezing, shortness of breath, hemoptysis Cardiovascular: See history of present illness No chest pain, presyncope, syncope, palpatations GI: Negative; No nausea, vomiting, diarrhea, or abdominal pain GU: Negative; No dysuria, hematuria, or difficulty voiding Musculoskeletal: Negative; no myalgias, joint pain, or weakness Hematologic/Oncology: Negative; no easy bruising, bleeding Endocrine: Negative; no heat/cold intolerance; no diabetes Neuro: Negative; no changes in balance, headaches Skin: Negative; No rashes or skin lesions Psychiatric: Negative; No behavioral problems, depression Sleep: Negative; No snoring, daytime sleepiness, hypersomnolence, bruxism, restless legs, hypnogognic hallucinations, no cataplexy Other comprehensive 14 point system review is negative.   PE BP 148/90  Pulse 54  Ht 5' 7.5" (1.715 m)  Wt 195 lb 3.2 oz (88.542 kg)  BMI  30.10 kg/m2  General: Alert, oriented, no distress.  Skin: normal turgor, no rashes HEENT: Normocephalic, atraumatic. Pupils round and reactive; sclera anicteric;no lid lag.  Nose without nasal septal hypertrophy Mouth/Parynx benign; Mallinpatti scale 2 Neck: No JVD, no carotid bruits; normal carotid up stroke Lungs: clear to ausculatation and percussion; no wheezing or rales Chest wall: Nontender to palpation the Heart: RRR, s1 s2 normal 1/6 sem; no diastolic murmur. No rubs thrills or heaves. Abdomen: soft, nontender; no hepatosplenomehaly, BS+; abdominal aorta nontender and not dilated by palpation. Back: No CVA tenderness. Catheterization site is well-healed Pulses 2+ Extremities: no clubbing cyanosis or edema, Homan's sign negative  Neurologic: grossly nonfocal Psychological: Normal affect and mood  ECG (independently read by me): Sinus bradycardia 54 beats per minute.  No ectopy.  QTc interval 396 ms.  No significant ST changes.  04/22/2013 ECG (independently read by me): Sinus bradycardia 55 beats per minute. Nonspecific ST changes  03/01/2013 ECG (independently read by me): Sinus bradycardia 52 beats per minute. Normal intervals. No significant ST changes.  Prior ECG of 11/20/2012: Sinus rhythm at 51 beats per minute. QTc interval 400 ms. No significant ST changes.  LABS:  BMET    Component Value Date/Time   NA 139 05/12/2013 1114   NA 143 04/23/2013 0810   K 4.4 05/12/2013 1114   CL 103 05/12/2013 1114   CO2 30 05/12/2013 1114   GLUCOSE 101* 05/12/2013 1114   GLUCOSE 102* 04/23/2013 0810   BUN 18 05/12/2013 1114   BUN 20 04/23/2013 0810   CREATININE 1.10 05/12/2013 1114   CREATININE 1.28 04/28/2013 0305   CALCIUM 9.7 05/12/2013 1114   GFRNONAA 63 05/12/2013 1114   GFRNONAA 51* 04/28/2013 0305   GFRAA 73 05/12/2013 1114   GFRAA 59* 04/28/2013 0305     Hepatic Function Panel     Component Value Date/Time   PROT 6.4 04/06/2013 0334   PROT 7.1 02/22/2013 0804   ALBUMIN 3.0* 04/06/2013  0334   AST 57* 04/06/2013 0334   ALT 59* 04/06/2013 0334   ALKPHOS 46 04/06/2013 0334   BILITOT 0.6 04/06/2013 0334     CBC    Component Value Date/Time   WBC 7.1 04/28/2013 0305   WBC 6.2 04/23/2013 0810   RBC 4.49 04/28/2013 0305   RBC 5.29 04/23/2013 0810   HGB 13.2 04/28/2013 0305   HCT 39.2 04/28/2013 0305   PLT 198 04/28/2013 0305   MCV 87.3 04/28/2013 0305   MCH  29.4 04/28/2013 0305   MCH 28.7 04/23/2013 0810   MCHC 33.7 04/28/2013 0305   MCHC 32.5 04/23/2013 0810   RDW 12.9 04/28/2013 0305   RDW 13.2 04/23/2013 0810   LYMPHSABS 1.8 05/08/2011 1354   MONOABS 1.0 05/08/2011 1354   EOSABS 0.3 05/08/2011 1354   BASOSABS 0.0 05/08/2011 1354     BNP    Component Value Date/Time   PROBNP 43.0 01/08/2010 1642    Lipid Panel     Component Value Date/Time   CHOL 139 04/06/2013 0334     RADIOLOGY: Dg Chest 2 View  08/17/2012   *RADIOLOGY REPORT*  Clinical Data: Renal cell carcinoma  CHEST - 2 VIEW  Comparison: 05/08/11  Findings: The cardiomediastinal silhouette is stable.  No acute infiltrate or pleural effusion.  No pulmonary edema.  Bony thorax is unremarkable.  IMPRESSION: No active disease.  No significant change.   Original Report Authenticated By: Lahoma Crocker, M.D.      ASSESSMENT AND PLAN: Mr. Cosper has established coronary artery disease dating back to 57 and has undergone multiple interventions in the past over a 10 year period from 54 to 1997. A nuclear perfusion study in April 2013 which remained stable. He developed unstable angina symptomatology earlier this year and catheterization done initially at [facility] showed multivessel disease.  Subsequently, he has undergone successful staged rotational coronary atherectomy initially involving the right coronary artery in February, and then on 04/27/2013 repeat intervention was done to the left circumflex system with rotational or directly.  At that time, he had a widely patent stent of his RCA, as well as a widely patent stent in his LAD.   He also had 60-70% mid LAD stenosis, which had not progressed.  Subsequent blood work, continued to show normal renal function, with a creatinine of 1.1 on 05/12/2013.  He currently is tolerating his Zetia, Livalo2 mg and fish oilfor his hyperlipidemia.  He continues to be on aspirin and Plavix forDAPT inhibition.  His BP today is controlled on amlodipine 10 mg and atenolol 25 mg. He will continue with his current regimen.  I will see him in 6 months for cardiology reevaluation.   [name], MD, [facility]  06/25/2013 9:32 AM

## 2013-07-08 ENCOUNTER — Ambulatory Visit: Payer: Medicare Other | Admitting: Cardiovascular Disease

## 2013-07-26 ENCOUNTER — Ambulatory Visit: Payer: Self-pay | Admitting: Family Medicine

## 2013-08-19 ENCOUNTER — Other Ambulatory Visit: Payer: Self-pay | Admitting: Cardiovascular Disease

## 2013-08-19 NOTE — Telephone Encounter (Signed)
Rx was sent to pharmacy electronically. 

## 2013-09-13 ENCOUNTER — Telehealth: Payer: Self-pay | Admitting: Cardiovascular Disease

## 2013-09-13 NOTE — Telephone Encounter (Signed)
Has Dr. [name] signed the pre op clearance for him to have eye surgery?

## 2013-09-13 NOTE — Telephone Encounter (Signed)
Spoke with pt, aware we have the clearance form and will have dr [name] sign this week when back in the office.

## 2013-09-20 ENCOUNTER — Telehealth: Payer: Self-pay | Admitting: Cardiovascular Disease

## 2013-09-20 NOTE — Telephone Encounter (Signed)
Need to know if pt is cleared for cataract surgery?is was faxed over on 09-13-13.Please fax back [phone].

## 2013-09-21 NOTE — Telephone Encounter (Signed)
Informed Dr. Tommy Rainwater office Dr. [name] is on vacation. Clearance was not completed before he left. I will try to get another physician to clear the patient to get his cataract surgery. Clearance given to Dr. Percival Spanish for review and recommendations.

## 2013-09-22 ENCOUNTER — Telehealth: Payer: Self-pay | Admitting: *Deleted

## 2013-09-22 NOTE — Telephone Encounter (Signed)
Rule faxed clearance signed by Dr. Percival Spanish to have cataract surgery to  Dr. Tommy Rainwater.

## 2013-11-04 HISTORY — PX: EYE SURGERY: SHX253

## 2013-12-13 ENCOUNTER — Telehealth: Payer: Self-pay | Admitting: Cardiovascular Disease

## 2013-12-13 DIAGNOSIS — I10 Essential (primary) hypertension: Secondary | ICD-10-CM

## 2013-12-13 DIAGNOSIS — I2510 Atherosclerotic heart disease of native coronary artery without angina pectoris: Secondary | ICD-10-CM

## 2013-12-13 DIAGNOSIS — E785 Hyperlipidemia, unspecified: Secondary | ICD-10-CM

## 2013-12-13 NOTE — Telephone Encounter (Signed)
Pt called in requesting that his lab orders be faxed to his house because he would like to have his labs drawn at [facility] in [place]  Thanks

## 2013-12-13 NOTE — Telephone Encounter (Signed)
Returned call to patient spoke to wife she stated husband needed lab orders mailed to him so he can have lab work before appointment.Lab orders mailed.

## 2013-12-22 ENCOUNTER — Encounter: Payer: Self-pay | Admitting: Family Medicine

## 2013-12-22 ENCOUNTER — Ambulatory Visit (INDEPENDENT_AMBULATORY_CARE_PROVIDER_SITE_OTHER): Payer: Medicare Other | Admitting: Family Medicine

## 2013-12-22 VITALS — BP 120/64 | HR 54 | Temp 97.5°F | Resp 16 | Ht 67.5 in | Wt 193.0 lb

## 2013-12-22 DIAGNOSIS — Z23 Encounter for immunization: Secondary | ICD-10-CM

## 2013-12-22 DIAGNOSIS — Z0000 Encounter for general adult medical examination without abnormal findings: Secondary | ICD-10-CM

## 2013-12-22 DIAGNOSIS — I2510 Atherosclerotic heart disease of native coronary artery without angina pectoris: Secondary | ICD-10-CM

## 2013-12-22 DIAGNOSIS — Z125 Encounter for screening for malignant neoplasm of prostate: Secondary | ICD-10-CM

## 2013-12-22 DIAGNOSIS — I10 Essential (primary) hypertension: Secondary | ICD-10-CM

## 2013-12-22 DIAGNOSIS — K635 Polyp of colon: Secondary | ICD-10-CM

## 2013-12-22 DIAGNOSIS — D0362 Melanoma in situ of left upper limb, including shoulder: Secondary | ICD-10-CM

## 2013-12-22 DIAGNOSIS — E785 Hyperlipidemia, unspecified: Secondary | ICD-10-CM

## 2013-12-22 DIAGNOSIS — C642 Malignant neoplasm of left kidney, except renal pelvis: Secondary | ICD-10-CM

## 2013-12-22 LAB — COMPLETE METABOLIC PANEL WITH GFR
ALK PHOS: 53 U/L (ref 39–117)
ALT: 36 U/L (ref 0–53)
AST: 33 U/L (ref 0–37)
Albumin: 4.4 g/dL (ref 3.5–5.2)
BUN: 20 mg/dL (ref 6–23)
CO2: 28 mEq/L (ref 19–32)
Calcium: 9.6 mg/dL (ref 8.4–10.5)
Chloride: 104 mEq/L (ref 96–112)
Creat: 1.2 mg/dL (ref 0.50–1.35)
GFR, Est African American: 66 mL/min
GFR, Est Non African American: 57 mL/min — ABNORMAL LOW
Glucose, Bld: 91 mg/dL (ref 70–99)
Potassium: 5 mEq/L (ref 3.5–5.3)
SODIUM: 140 meq/L (ref 135–145)
TOTAL PROTEIN: 7.5 g/dL (ref 6.0–8.3)
Total Bilirubin: 1 mg/dL (ref 0.2–1.2)

## 2013-12-22 LAB — POCT URINALYSIS DIPSTICK
Bilirubin, UA: NEGATIVE
Glucose, UA: NEGATIVE
KETONES UA: NEGATIVE
LEUKOCYTES UA: NEGATIVE
Nitrite, UA: NEGATIVE
PROTEIN UA: NEGATIVE
Spec Grav, UA: 1.02
UROBILINOGEN UA: 0.2
pH, UA: 6

## 2013-12-22 LAB — CBC WITH DIFFERENTIAL/PLATELET
BASOS ABS: 0 10*3/uL (ref 0.0–0.1)
BASOS PCT: 0 % (ref 0–1)
Eosinophils Absolute: 0.2 10*3/uL (ref 0.0–0.7)
Eosinophils Relative: 4 % (ref 0–5)
HCT: 46.2 % (ref 39.0–52.0)
Hemoglobin: 15.8 g/dL (ref 13.0–17.0)
Lymphocytes Relative: 24 % (ref 12–46)
Lymphs Abs: 1.5 10*3/uL (ref 0.7–4.0)
MCH: 29.4 pg (ref 26.0–34.0)
MCHC: 34.2 g/dL (ref 30.0–36.0)
MCV: 86 fL (ref 78.0–100.0)
MPV: 9.6 fL (ref 9.4–12.4)
Monocytes Absolute: 0.7 10*3/uL (ref 0.1–1.0)
Monocytes Relative: 12 % (ref 3–12)
NEUTROS PCT: 60 % (ref 43–77)
Neutro Abs: 3.7 10*3/uL (ref 1.7–7.7)
PLATELETS: 261 10*3/uL (ref 150–400)
RBC: 5.37 MIL/uL (ref 4.22–5.81)
RDW: 13.2 % (ref 11.5–15.5)
WBC: 6.1 10*3/uL (ref 4.0–10.5)

## 2013-12-22 LAB — LIPID PANEL
CHOL/HDL RATIO: 3.4 ratio
CHOLESTEROL: 155 mg/dL (ref 0–200)
HDL: 45 mg/dL (ref >39)
LDL Cholesterol: 84 mg/dL (ref 0–99)
Triglycerides: 130 mg/dL (ref <150)
VLDL: 26 mg/dL (ref 0–40)

## 2013-12-22 MED ORDER — FINASTERIDE 5 MG PO TABS: 5.0000 mg | ORAL_TABLET | Freq: Every day | ORAL | Status: AC

## 2013-12-22 NOTE — Progress Notes (Signed)
   Subjective:    Patient ID: Nicholas Douglas, male    DOB: 1931-06-28, 80 y.o.   MRN: [id number]  HPI    Review of Systems  Constitutional: Negative.   HENT: Positive for hearing loss.   Eyes: Negative.   Respiratory: Positive for shortness of breath.   Cardiovascular: Negative.   Gastrointestinal: Negative.   Endocrine: Negative.   Genitourinary: Negative.   Musculoskeletal: Negative.   Skin: Negative.   Allergic/Immunologic: Negative.   Neurological: Negative.   Hematological: Negative.   Psychiatric/Behavioral: Negative.        Objective:   Physical Exam        Assessment & Plan:

## 2013-12-22 NOTE — Progress Notes (Addendum)
Subjective:    Patient ID: Nicholas Douglas, male    DOB: 18-Jul-1931, 80 y.o.   MRN: [id number]  12/22/2013  Annual Exam and Medication Refill   HPI This 80 y.o. male presents for Annual Wellness Examination and Complete Physical Examination.  Last physical:  12/03/2011 Colonoscopy:  10/2008; 2013; +polyps; [name].  Repeat three years. TDAP:  2012 Pneumovax:   2006 Zostavax:   2011 Influenza:  11/04/2013 Eye exam:  Cataract surgery B; Beavis.  Lots of dryness in L eye.      Renal cell carcinonoma: followed annually; [facility]: followed every year; PSA 1.59 at recent visit; [facility] did not recommend ongoing PSAs.  S/p CT recently that revealed a nodular prostate; discussed CT findings with [facility] who did not recommend biopsy of prostate.  PSA obtained and was stable from previous values.      HTN: Patient reports good compliance with medication, good tolerance to medication, and good symptom control.     CAD: s/p 3 stents placed in 2015 by cardiology for unstable angina.  Followed every six months by cardiology.  Denies CP/palp/SOB/leg swelling.      Melanoma: followed every six months by dermatology; Dr. Koleen Nimrod retired in 11/2013.  Pt to establish with new dermatologist as well.    NO URINARY INCONTINENCE.    Review of Systems  Constitutional: Negative for fever, chills, diaphoresis, activity change, appetite change, fatigue and unexpected weight change.  HENT: Positive for hearing loss. Negative for congestion, dental problem, drooling, ear discharge, ear pain, facial swelling, mouth sores, nosebleeds, postnasal drip, rhinorrhea, sinus pressure, sneezing, sore throat, tinnitus, trouble swallowing and voice change.   Eyes: Negative for photophobia, pain, discharge, redness, itching and visual disturbance.  Respiratory: Positive for shortness of breath. Negative for apnea, cough, choking, chest tightness, wheezing and stridor.   Cardiovascular: Negative for chest pain,  palpitations and leg swelling.  Gastrointestinal: Negative for nausea, vomiting, abdominal pain, diarrhea, constipation and blood in stool.  Endocrine: Negative for cold intolerance, heat intolerance, polydipsia, polyphagia and polyuria.  Genitourinary: Negative for dysuria, urgency, frequency, hematuria, flank pain, decreased urine volume, discharge, penile swelling, scrotal swelling, enuresis, difficulty urinating, genital sores, penile pain and testicular pain.  Musculoskeletal: Negative for myalgias, back pain, joint swelling, arthralgias, gait problem, neck pain and neck stiffness.  Skin: Negative for color change, pallor, rash and wound.  Allergic/Immunologic: Negative for environmental allergies, food allergies and immunocompromised state.  Neurological: Negative for dizziness, tremors, seizures, syncope, facial asymmetry, speech difficulty, weakness, light-headedness, numbness and headaches.  Hematological: Negative for adenopathy. Does not bruise/bleed easily.  Psychiatric/Behavioral: Negative for suicidal ideas, hallucinations, behavioral problems, confusion, sleep disturbance, self-injury, dysphoric mood, decreased concentration and agitation. The patient is not nervous/anxious and is not hyperactive.     Past Medical History  Diagnosis Date  . Hypertension   . Personal history of colonic polyps   . Other abnormal blood chemistry   . Other nonspecific abnormal serum enzyme levels   . Coronary atherosclerosis of unspecified type of vessel, native or graft   . Impotence of organic origin   . Tobacco use disorder   . Pure hypercholesterolemia   . Internal hemorrhoids without mention of complication   . Melanoma 06/2010    Left arm  . Myocardial infarction 1976  . Unspecified congenital cystic kidney disease   . Malignant neoplasm of kidney, except pelvis   . Calculus of kidney     "that's how they found the cancer" (04/27/2013)   Past Surgical History  Procedure  Laterality Date   . Nephrectomy Left 2006  . Melanoma excision Left ~ 2007    "arm"  . Colonoscopy  10/05/2008    single polyp, IH.  [name].  Repeat in 3 years.  . Carotid dopplers  03/08/2011    minimal plaque formation B. Symptoms: dizziness.  . Cardiolite  05/06/2011    low risk study; normal EF.  SE H&V.  Marland Kitchen Coronary angioplasty      "I've had 4" (04/27/2013)  . Coronary angioplasty with stent placement  04/2013; 04/27/2013    "2 + 1" (04/27/2013)  . Appendectomy  2014  . Eye surgery  11/04/2013    Cataract surgery B; Beavis.   Allergies  Allergen Reactions  . Lovastatin Other (See Comments)    Elevated liver enzymes  . Morphine Hives  . Morphine And Related Hives  . Nifedipine Other (See Comments)    Elevated liver enzymes   Current Outpatient Prescriptions  Medication Sig Dispense Refill  . amLODipine (NORVASC) 10 MG tablet Take 1 tablet by mouth daily 90 tablet 2  . aspirin 81 MG tablet Take 81 mg by mouth daily.    Marland Kitchen atenolol (TENORMIN) 25 MG tablet Take 12.5 mg by mouth 2 (two) times daily.    . clopidogrel (PLAVIX) 75 MG tablet Take 75 mg by mouth daily.    . nitroGLYCERIN (NITROSTAT) 0.4 MG SL tablet Place 1 tablet (0.4 mg total) under the tongue every 5 (five) minutes as needed for chest pain. 25 tablet 12  . omega-3 acid ethyl esters (LOVAZA) 1 G capsule Take 2 capsules (2 g total) by mouth daily. 180 capsule 3  . Pitavastatin Calcium (LIVALO) 2 MG TABS Take 2 mg by mouth.    . valsartan (DIOVAN) 80 MG tablet Take 80 mg by mouth daily.    Marland Kitchen ezetimibe (ZETIA) 10 MG tablet Take 1 tablet (10 mg total) by mouth daily. 90 tablet 3  . finasteride (PROSCAR) 5 MG tablet Take 1 tablet (5 mg total) by mouth daily. 90 tablet 3   No current facility-administered medications for this visit.       Objective:    BP 120/64 mmHg  Pulse 54  Temp(Src) 97.5 F (36.4 C) (Oral)  Resp 16  Ht 5' 7.5" (1.715 m)  Wt 193 lb (87.544 kg)  BMI 29.76 kg/m2  SpO2 97% Physical Exam  Constitutional: He is  oriented to person, place, and time. He appears well-developed and well-nourished. No distress.  HENT:  Head: Normocephalic and atraumatic.  Right Ear: External ear normal.  Left Ear: External ear normal.  Nose: Nose normal.  Mouth/Throat: Oropharynx is clear and moist.  Eyes: Conjunctivae and EOM are normal. Pupils are equal, round, and reactive to light.  Neck: Normal range of motion. Neck supple. Carotid bruit is not present. No thyromegaly present.  Cardiovascular: Normal rate, regular rhythm, normal heart sounds and intact distal pulses.  Exam reveals no gallop and no friction rub.   No murmur heard. Pulmonary/Chest: Effort normal and breath sounds normal. He has no wheezes. He has no rales.  Abdominal: Soft. Bowel sounds are normal. He exhibits no distension and no mass. There is no tenderness. There is no rebound and no guarding.  Musculoskeletal:       Right shoulder: Normal.       Left shoulder: Normal.       Cervical back: Normal.  Lymphadenopathy:    He has no cervical adenopathy.  Neurological: He is alert and oriented to person, place, and  time. He has normal reflexes. No cranial nerve deficit. He exhibits normal muscle tone. Coordination normal.  Skin: Skin is warm and dry. No rash noted. He is not diaphoretic.  Psychiatric: He has a normal mood and affect. His behavior is normal. Judgment and thought content normal.   PREVNAR-13 ADMINISTERED.     Assessment & Plan:   1. Routine general medical examination at a health care facility   2. Essential hypertension, benign   3. Hyperlipemia   4. Screening for prostate cancer   5. Renal cell carcinoma of left kidney   6. Coronary artery disease involving native coronary artery of native heart without angina pectoris   7. Melanoma in situ of upper arm, left   8. Colon polyps       1. Annual Wellness Exam and Complete Physical Examination:  Anticipatory guidance --- weight loss, exercise.   Overdue for colonoscopy but had  recent stenting to heart; will plan to schedule colonoscopy at [place] next year.  S/p Prevnar 13 in office.  No advanced directives but desires DNR/DNI.  Reported hearing loss; recommend formal hearing evaluation.  Low fall risk. Independent with ADLs.  No evidence of depression.  NO URINARY INCONTINENCE. 2.  HTN: controlled; obtain labs; continue current medications. 3.  Hyperlipidemia: controlled moderately;obtain labs; continue Pitavastatin, Zetia, fish oil. 4.  Screening prostate cancer: obtain PSA; agree that no longer warrants PSAs any longer. 5.  L RCC: stable; followed by urology yearly. 6.  CAD: stable; s/p stenting earlier in year; currently asymptomatic.  Followed by cardiology every six months. 7.  L arm melanoma hx: stable; followed by dermatology every six months; recommend establishing with Dr. Evorn Gong or [facility] in  [place]; Koleen Nimrod recently retired. 8. Colon polyps: last colonoscopy 2010; due for repeat in 2013; recent stenting thus recommend deferring colonoscopy until next year.    Meds ordered this encounter  Medications  . finasteride (PROSCAR) 5 MG tablet    Sig: Take 1 tablet (5 mg total) by mouth daily.    Dispense:  90 tablet    Refill:  3    Return in about 1 year (around 12/23/2014) for complete physical examiniation.    [name], M.D.  [facility] [address] [place], [place]  [address] [phone] phone [phone] fax

## 2013-12-22 NOTE — Patient Instructions (Signed)

## 2013-12-23 LAB — PSA, MEDICARE: PSA: 1.49 ng/mL (ref <=4.00)

## 2013-12-27 ENCOUNTER — Encounter: Payer: Self-pay | Admitting: Cardiovascular Disease

## 2013-12-27 ENCOUNTER — Ambulatory Visit (INDEPENDENT_AMBULATORY_CARE_PROVIDER_SITE_OTHER): Payer: Medicare Other | Admitting: Cardiovascular Disease

## 2013-12-27 VITALS — BP 147/86 | HR 54 | Ht 67.5 in | Wt 197.5 lb

## 2013-12-27 DIAGNOSIS — C642 Malignant neoplasm of left kidney, except renal pelvis: Secondary | ICD-10-CM

## 2013-12-27 DIAGNOSIS — I2510 Atherosclerotic heart disease of native coronary artery without angina pectoris: Secondary | ICD-10-CM

## 2013-12-27 DIAGNOSIS — I10 Essential (primary) hypertension: Secondary | ICD-10-CM

## 2013-12-27 DIAGNOSIS — E785 Hyperlipidemia, unspecified: Secondary | ICD-10-CM

## 2013-12-27 DIAGNOSIS — I2583 Coronary atherosclerosis due to lipid rich plaque: Secondary | ICD-10-CM

## 2013-12-27 MED ORDER — EZETIMIBE 10 MG PO TABS: 10.0000 mg | ORAL_TABLET | Freq: Every day | ORAL | Status: DC

## 2013-12-27 NOTE — Progress Notes (Signed)
Patient ID: Nicholas Douglas, male   DOB: 09-May-1931, 80 y.o.   MRN: [id number]        HPI: Nicholas Douglas is an 80 y.o. Caucasian male who presents to the office today for a six-month follow-up evaluation.  [name] has known CAD and underwent numerous interventions dating back to 58, 1991, 1993, and in 1997. Cardiac catheterization in 2011 which showed preserved LV function with mild residual distal inferior apical hypocontractility. There is evidence for coronary calcification with segmental narrowing of his LAD of 30-40% proximally, 50% diffusely, in the midsegment, and 70-80% in the distal region, he had AV groove circumflex stenoses of 70 and 50% with a 40% OM 2 stenosis, and a 30-40 and 50% RCA stenoses with 80-90% stenosis in the acute marginal branch. He had been on medical therapy.  He  presented to [facility] in March 2015 with class IV angina and catheterization demonstrated severe 2 vessel CAD with significant current calcification of the LAD with up to 95% mid LAD stenosis and diffuse proximal stenosis. He underwent successful high-speed rotational atherectomy the following day by Dr. Martinique and a 1.5 mm bur was used. Mid LAD was stented with a 2.75x38 mm Promus stent in the proximal LAD was stented with a 3.0x28 mm Promus stent. Medical therapy was recommended for his distal LAD stenosis. He has only one kidney and staged intervention to the left circumflex coronary artery was planned. He presents now for followup evaluation and to reschedule that staged intervention.  He underwent staged intervention to his left circumflex coronary artery, which was significantly calcified.  He underwent staged left circumflex PCI on 04/27/2013 by me with successful high-speed rotational atherectomy with a 1.5 and 1.75 mm burr, and ultimately had a 2.5x28 mm Promus premier DES stent inserted into the circumflex vessel, which was post dilated to approximately 2.6 mm.  Subsequent, he has felt significantly  improved.    He also has a history of significant hyperlipidemia and has had significant increased number of small LDL particles and insulin resistance. This seemed to markedly improve with the addition of Niaspan added to zetia and lovaza.  Remotely, he had been on statins consisting of Lipitor and subsequently Crestor. He did have transient LFT elevation. He also concerns of possible risk of developing dementia with statin therapy.  He had  some episodes of Niaspan induced diffuse flushing. He wanted to stop taking his Niaspan. He did have subsequent NMR off Niaspan and done just on Zetia and as well as 2 g of Lovaza.  This showed increased abnormalities such that his total cholesterol was 201, LDL had risen to 124, but he now had LDL small particles which have increased from 685 to 1348. Laboratory on 10/26/2012: LDL particle number was elevated at 1657, LDL cholesterol 112 triglycerides 155 and total cholesterol 190. He continued to be insulin resistant with insulin resistance scored 65. When I last saw him, we elected to try Livalo and ultimately titrated this to 4 mg daily to take in addition to his Zetia 10 mg. Laboratory on 02/22/2013: LDL particle number was  markedly improved at 774 with an HDL of 40 calculated LDL 50 small LDL particle #417. Insulin resistance score was also improved at 51.  In late July he developed abdominal discomfort and was found to have acute appendicitis. On CT imaging he was also noted to have prostate enlargement with nodularity and thickening of the bladder base and cystoscopy was suggested for further evaluation. He is status  post left nephrectomy. He also was noted to have a 2.3 cm infrarenal suprailiac abdominal aortic aneurysm.  Over the past 6 months, he has been without anginal symptoms.  He does note some mild shortness of breath with activity.  He has been on Livalo 2 mg, Zetia 10 mg for hyperlipidemia.  Lab work on 12/22/2013 showed a cholesterol 155,  triglycerides 1:30, HDL 45, LDL 84.  He has been on amlodipine 10 mg atenolol 12.5 mg twice a day and valsartan 80 mg daily for blood pressure.  He continues to be on aspirin and Plavix for dual antiplatelet therapy.  He presents for evaluation.      Past Medical History  Diagnosis Date  . Hypertension   . Personal history of colonic polyps   . Other abnormal blood chemistry   . Other nonspecific abnormal serum enzyme levels   . Coronary atherosclerosis of unspecified type of vessel, native or graft   . Impotence of organic origin   . Tobacco use disorder   . Pure hypercholesterolemia   . Internal hemorrhoids without mention of complication   . Melanoma 06/2010    Left arm  . Myocardial infarction 1976  . Unspecified congenital cystic kidney disease   . Malignant neoplasm of kidney, except pelvis   . Calculus of kidney     "that's how they found the cancer" (04/27/2013)    Past Surgical History  Procedure Laterality Date  . Nephrectomy Left 2006  . Melanoma excision Left ~ 2007    "arm"  . Colonoscopy  10/05/2008    single polyp, IH.  [name].  Repeat in 3 years.  . Carotid dopplers  03/08/2011    minimal plaque formation B. Symptoms: dizziness.  . Cardiolite  05/06/2011    low risk study; normal EF.  SE H&V.  Marland Kitchen Coronary angioplasty      "I've had 4" (04/27/2013)  . Coronary angioplasty with stent placement  04/2013; 04/27/2013    "2 + 1" (04/27/2013)  . Appendectomy  2014  . Eye surgery  11/04/2013    Cataract surgery B; Beavis.    Allergies  Allergen Reactions  . Lovastatin Other (See Comments)    Elevated liver enzymes  . Morphine Hives  . Morphine And Related Hives  . Nifedipine Other (See Comments)    Elevated liver enzymes    Current Outpatient Prescriptions  Medication Sig Dispense Refill  . amLODipine (NORVASC) 10 MG tablet Take 1 tablet by mouth daily 90 tablet 2  . aspirin 81 MG tablet Take 81 mg by mouth daily.    Marland Kitchen atenolol (TENORMIN) 25 MG tablet Take 12.5  mg by mouth 2 (two) times daily.    . clopidogrel (PLAVIX) 75 MG tablet Take 75 mg by mouth daily.    Marland Kitchen ezetimibe (ZETIA) 10 MG tablet Take 10 mg by mouth daily.    . finasteride (PROSCAR) 5 MG tablet Take 1 tablet (5 mg total) by mouth daily. 90 tablet 3  . nitroGLYCERIN (NITROSTAT) 0.4 MG SL tablet Place 1 tablet (0.4 mg total) under the tongue every 5 (five) minutes as needed for chest pain. 25 tablet 12  . omega-3 acid ethyl esters (LOVAZA) 1 G capsule Take 2 capsules (2 g total) by mouth daily. 180 capsule 3  . Pitavastatin Calcium (LIVALO) 2 MG TABS Take 2 mg by mouth.    . valsartan (DIOVAN) 80 MG tablet Take 80 mg by mouth daily.     No current facility-administered medications for this visit.  Socially he remains active. He is married has 5 children 10 grandchildren 2 great-grandchildren. Is no alcohol use. He typically scores below 75 and plays golf 4 days per week.  He has turned 82 any typically scores in the 70s, better than his age.  ROS General: Negative; No fevers, chills, or night sweats;  HEENT: Negative; No changes in vision or hearing, sinus congestion, difficulty swallowing Pulmonary: Negative; No cough, wheezing, shortness of breath, hemoptysis Cardiovascular: See history of present illness; No chest pain, presyncope, syncope, palpatations GI: Negative; No nausea, vomiting, diarrhea, or abdominal pain GU: Negative; No dysuria, hematuria, or difficulty voiding Musculoskeletal: Negative; no myalgias, joint pain, or weakness Hematologic/Oncology: Negative; no easy bruising, bleeding Endocrine: Negative; no heat/cold intolerance; no diabetes Neuro: Negative; no changes in balance, headaches Skin: Negative; No rashes or skin lesions Psychiatric: Negative; No behavioral problems, depression Sleep: Negative; No snoring, daytime sleepiness, hypersomnolence, bruxism, restless legs, hypnogognic hallucinations, no cataplexy Other comprehensive 14 point system review is  negative.   PE BP 147/86 mmHg  Pulse 54  Ht 5' 7.5" (1.715 m)  Wt 197 lb 8 oz (89.585 kg)  BMI 30.46 kg/m2  General: Alert, oriented, no distress.  Skin: normal turgor, no rashes HEENT: Normocephalic, atraumatic. Pupils round and reactive; sclera anicteric;no lid lag.  Nose without nasal septal hypertrophy Mouth/Parynx benign; Mallinpatti scale 2 Neck: No JVD, no carotid bruits; normal carotid up stroke Lungs: clear to ausculatation and percussion; no wheezing or rales Chest wall: Nontender to palpation the Heart: RRR, s1 s2 normal 1/6 sem; no diastolic murmur. No rubs thrills or heaves. Abdomen: soft, nontender; no hepatosplenomehaly, BS+; abdominal aorta nontender and not dilated by palpation. Back: No CVA tenderness. Pulses 2+ Extremities: no clubbing cyanosis or edema, Homan's sign negative  Neurologic: grossly nonfocal Psychological: Normal affect and mood  ECG (independently read by me): Sinus bradycardia 54 bpm.  No significant ST-T changes.  Normal intervals.  May 2015 ECG (independently read by me): Sinus bradycardia 54 beats per minute.  No ectopy.  QTc interval 396 ms.  No significant ST changes.  04/22/2013 ECG (independently read by me): Sinus bradycardia 55 beats per minute. Nonspecific ST changes  03/01/2013 ECG (independently read by me): Sinus bradycardia 52 beats per minute. Normal intervals. No significant ST changes.  Prior ECG of 11/20/2012: Sinus rhythm at 51 beats per minute. QTc interval 400 ms. No significant ST changes.  LABS:  BMET    Component Value Date/Time   NA 140 12/22/2013 0945   NA 143 04/23/2013 0810   K 5.0 12/22/2013 0945   CL 104 12/22/2013 0945   CO2 28 12/22/2013 0945   GLUCOSE 91 12/22/2013 0945   GLUCOSE 102* 04/23/2013 0810   BUN 20 12/22/2013 0945   BUN 20 04/23/2013 0810   CREATININE 1.20 12/22/2013 0945   CREATININE 1.28 04/28/2013 0305   CALCIUM 9.6 12/22/2013 0945   GFRNONAA 57* 12/22/2013 0945   GFRNONAA 51*  04/28/2013 0305   GFRAA 66 12/22/2013 0945   GFRAA 59* 04/28/2013 0305     Hepatic Function Panel     Component Value Date/Time   PROT 7.5 12/22/2013 0945   PROT 7.1 02/22/2013 0804   ALBUMIN 4.4 12/22/2013 0945   AST 33 12/22/2013 0945   ALT 36 12/22/2013 0945   ALKPHOS 53 12/22/2013 0945   BILITOT 1.0 12/22/2013 0945     CBC    Component Value Date/Time   WBC 6.1 12/22/2013 0945   WBC 6.2 04/23/2013 0810   RBC 5.37  12/22/2013 0945   RBC 5.29 04/23/2013 0810   HGB 15.8 12/22/2013 0945   HCT 46.2 12/22/2013 0945   PLT 261 12/22/2013 0945   MCV 86.0 12/22/2013 0945   MCH 29.4 12/22/2013 0945   MCH 28.7 04/23/2013 0810   MCHC 34.2 12/22/2013 0945   MCHC 32.5 04/23/2013 0810   RDW 13.2 12/22/2013 0945   RDW 13.2 04/23/2013 0810   LYMPHSABS 1.5 12/22/2013 0945   MONOABS 0.7 12/22/2013 0945   EOSABS 0.2 12/22/2013 0945   BASOSABS 0.0 12/22/2013 0945     BNP    Component Value Date/Time   PROBNP 43.0 01/08/2010 1642    Lipid Panel     Component Value Date/Time   CHOL 155 12/22/2013 0945   Lipid Panel     Component Value Date/Time   CHOL 155 12/22/2013 0945   TRIG 130 12/22/2013 0945   TRIG 131 02/22/2013 0804   HDL 45 12/22/2013 0945   HDL 40 02/22/2013 0804   CHOLHDL 3.4 12/22/2013 0945   VLDL 26 12/22/2013 0945   LDLCALC 84 12/22/2013 0945   LDLCALC 50 02/22/2013 0804     RADIOLOGY: Dg Chest 2 View  08/17/2012   *RADIOLOGY REPORT*  Clinical Data: Renal cell carcinoma  CHEST - 2 VIEW  Comparison: 05/08/11  Findings: The cardiomediastinal silhouette is stable.  No acute infiltrate or pleural effusion.  No pulmonary edema.  Bony thorax is unremarkable.  IMPRESSION: No active disease.  No significant change.   Original Report Authenticated By: Lahoma Crocker, M.D.      ASSESSMENT AND PLAN: Mr. Ridley has established coronary artery disease dating back to 33 and has undergone multiple interventions in the past over a 10 year period from 61 to 1997. He  developed unstable angina symptomatology earlier this year and catheterization done initially at [facility] showed multivessel disease.  Subsequently, he has undergone successful staged rotational coronary atherectomy initially involving the RCA in early March, and then on 04/27/2013 repeat intervention was done to the left circumflex system with rotational atherectomy.  At that time, he had a widely patent stent of his RCA, as well as a widely patent stent in his LAD.  He also had 60-70% mid LAD stenosis, which had not progressed.  He has a history of hypertension.  His blood pressure today is well controlled on repeat by me was 110/80 on his current medical regimen consisting of amlodipine 10 mg atenolol 12.50 g twice a day and valsartan 80 mg.  He is on lipid lowering therapy and is tolerating Livalo 2 mg in addition to Zetia and fish oil.  His most recent lipid studies are controlled, although LDL cholesterol slightly above target at 84.  He does note some mild shortness of breath with activity but denies chest pain.  He also has one remaining kidney.  Repeat blood work on 12/22/2013 showed his serum creatinine at 1.20 which is fairly stable.  Fasting blood sugar was normal at 91.  With his multivessel intervention done early this year, I will see him in 6 months for follow-up evaluation and prior to that office visit.  I am recommending a one-year follow-up nuclear perfusion study to assess for ischemia.  I will see him in 6 months for cardiology reevaluation.  Time spent: 25 minutes   [name], MD, [facility]  12/27/2013 12:18 PM

## 2013-12-27 NOTE — Patient Instructions (Signed)
Your physician has requested that you have a lexiscan myoview and office visit in 6 months. No changes were made today in your therapy.

## 2013-12-28 ENCOUNTER — Encounter: Payer: Self-pay | Admitting: Family Medicine

## 2014-01-04 ENCOUNTER — Encounter: Payer: Self-pay | Admitting: Cardiovascular Disease

## 2014-01-13 ENCOUNTER — Encounter (HOSPITAL_COMMUNITY): Payer: Self-pay | Admitting: Cardiology

## 2014-02-22 ENCOUNTER — Telehealth: Payer: Self-pay | Admitting: Cardiovascular Disease

## 2014-02-22 MED ORDER — ATENOLOL 25 MG PO TABS: 12.5000 mg | ORAL_TABLET | Freq: Two times a day (BID) | ORAL | Status: DC

## 2014-02-22 NOTE — Telephone Encounter (Signed)
Pt's wife called, insurance is no longer covering Lovaza. She does not know if they are covering an alternative.  She has 3-4 week supply left and would like to know if he can take something else.   Also refilled Atenolol 25mg  #90 per pt request.

## 2014-02-22 NOTE — Telephone Encounter (Signed)
Please call,need to get his Lovaza changed.His insurance will no longer pay for it.

## 2014-02-26 NOTE — Telephone Encounter (Signed)
lovaza is now generic. Can try prescription vascepa but not sure what the cost is.

## 2014-02-28 NOTE — Telephone Encounter (Signed)
Pt's wife states Dalene Seltzer is under preferred list from their insurance, she would like to try this.   He is currently taking Lovaza 1g BID. Should he take the same amt for the Vascepa?

## 2014-02-28 NOTE — Telephone Encounter (Signed)
Ok to try 

## 2014-03-01 MED ORDER — ICOSAPENT ETHYL 1 G PO CAPS: 1.0000 g | ORAL_CAPSULE | Freq: Two times a day (BID) | ORAL | Status: DC

## 2014-03-01 NOTE — Telephone Encounter (Signed)
Rx submitted to pharmacy of preference.

## 2014-03-24 ENCOUNTER — Telehealth: Payer: Self-pay | Admitting: Cardiovascular Disease

## 2014-03-24 ENCOUNTER — Other Ambulatory Visit: Payer: Self-pay | Admitting: *Deleted

## 2014-03-24 MED ORDER — VALSARTAN 80 MG PO TABS: 80.0000 mg | ORAL_TABLET | Freq: Every day | ORAL | Status: DC

## 2014-03-24 MED ORDER — PITAVASTATIN CALCIUM 2 MG PO TABS: 2.0000 mg | ORAL_TABLET | Freq: Once | ORAL | Status: DC

## 2014-03-24 MED ORDER — CLOPIDOGREL BISULFATE 75 MG PO TABS: 75.0000 mg | ORAL_TABLET | Freq: Every day | ORAL | Status: DC

## 2014-03-24 NOTE — Telephone Encounter (Signed)
°  1. Which medications need to be refilled? Valsartan 160 mg,Clopidogrel 75 mg, and Livalo 4 mg   2. Which pharmacy is medication to be sent to?[facility]  3. Do they need a 30 day or 90 day supply? 90 days and refills  4. Would they like a call back once the medication has been sent to the pharmacy? no

## 2014-03-24 NOTE — Telephone Encounter (Signed)
Refilled

## 2014-03-24 NOTE — Telephone Encounter (Signed)
See refill request.

## 2014-03-25 ENCOUNTER — Telehealth: Payer: Self-pay | Admitting: Cardiovascular Disease

## 2014-03-25 NOTE — Telephone Encounter (Signed)
Problem resolved over the phone by a prior message.

## 2014-03-29 ENCOUNTER — Telehealth: Payer: Self-pay | Admitting: Cardiovascular Disease

## 2014-03-29 NOTE — Telephone Encounter (Signed)
Broadus John was calling in regards to the patient taking Livalo. He stated if patient needs to take this medication then a prior authorization is needed. Broadus John transferred me to PA representative. The rep has faxed a PA form for Livalo 2mg  to the attention of Dr. Evette Georges primary-Wanda Saverio Danker. She will need to complete the PA and fax back. Will give PA form to [place] when received.   If patient can take different medication then please contact [facility] @ [phone].

## 2014-03-31 NOTE — Telephone Encounter (Signed)
PA completed and [facility]ed [facility] for [facility]o.

## 2014-04-12 ENCOUNTER — Telehealth: Payer: Self-pay | Admitting: *Deleted

## 2014-04-12 NOTE — Telephone Encounter (Signed)
Received [facility] from [facility] for patient's Livalo. Approved  03/01/14 until 03/31/15.

## 2014-05-24 ENCOUNTER — Other Ambulatory Visit: Payer: Self-pay | Admitting: Cardiovascular Disease

## 2014-05-24 MED ORDER — AMLODIPINE BESYLATE 10 MG PO TABS: 10.0000 mg | ORAL_TABLET | Freq: Every day | ORAL | Status: DC

## 2014-05-24 NOTE — Telephone Encounter (Signed)
°  1. Which medications need to be refilled? Amlodipine  2. Which pharmacy is medication to be sent to?[facility]  3. Do they need a 30 day or 90 day supply? 90 and refills  4. Would they like a call back once the medication has been sent to the pharmacy? no

## 2014-05-24 NOTE — Telephone Encounter (Signed)
Rx refill sent to patient pharmacy   

## 2014-05-27 NOTE — Consult Note (Signed)
PATIENT NAME:  Nicholas Douglas, Nicholas Douglas MR#:  [id number] DATE OF BIRTH:  April 30, 1931  DATE OF CONSULTATION:  08/28/2012  REFERRING PHYSICIAN:  Harrell Gave A. Lundquist, MD CONSULTING PHYSICIAN:  Janice Coffin. Elnoria Howard, DO  Nicholas Douglas is seen by me at the request of his surgeon, Dr. Rexene Edison, for postop urinary retention. He had this problem before after surgery. He has been on intermittent catheterization, so my impression is postop urinary retention with some BPH. The plan is to put him on intermittent self-cath, finasteride 5 mg daily for long-term control of his BPH, Rapaflo 8 mg 1 daily 30 minutes after a meal and Bactrim DS 1 daily because in the past, he has had infection when he has catheterized himself, so I have written scripts for same.   HISTORY: The patient was admitted 08/26/2012 with leukocytosis, right lower quadrant pain, and had an appendectomy with a laparoscope. He has a past history of angioplasty. He had kidney cancer in 2006 and had a nephrectomy for that, hypertension, hyperlipidemia, coronary disease.   PAST MEDICAL HISTORY: Myocardial  infarct and angioplasty.  ALLERGIES: MORPHINE - HIVES. MEVACOR AND PROCARDIA - UNKNOWN REACTION TO THAT.   SOCIAL HISTORY: He is positive for EtOH, social alcohol. Tobacco: Quit 1 week ago.  Lives with his wife. Works as a Company secretary. Golfs 4 times a week.   REVIEW OF SYSTEMS: Today, he has positive urinary retention. No cough. He has some abdominal discomfort but not pain. No diarrhea, constipation, nausea, vomiting, chest pain. He has acute urinary retention.   PHYSICAL EXAMINATION: GENERAL: Well-developed, well-nourished male in no acute distress.  EYES: Normal to react to light.  LUNGS: He has good respiratory effort. He is nose breathing, not mouth breathing. Lungs are clear to auscultation. HEART: Regular rate and rhythm.  ABDOMEN: Shows some periumbilical bruising from his periumbilical incision for his laparoscopic appendectomy. His abdomen is soft,  but he is a little bit sore.  SKIN: Normal to palpation. NEUROLOGIC: Cranial nerves intact.  RECTAL: He refused rectal exam, as he had 2 within the last week and does not want one, but he says he has been told it is normal. I will do a rectal in the office in the near future.   LABORATORY DATA: Review of his labs revealed that patient has a BUN of 22, glucose 117, creatinine 1.25. His white count on admission was elevated at 11.6. There is none since. Urinalysis was basically negative. CT scan initially showed appendicitis.  PLAN: As above. Followup is in the future in my office. We will make an appointment for him. I will order discontinuation of his Foley today.   ____________________________ Janice Coffin. Elnoria Howard, DO rdh:jm D: 08/28/2012 16:17:00 ET T: 08/28/2012 17:07:29 ET JOB#: [id number]  cc: Janice Coffin. Elnoria Howard, DO, <Dictator> Delilah Mulgrew D Adelis Docter DO ELECTRONICALLY SIGNED 09/24/2012 11:10

## 2014-05-27 NOTE — Op Note (Signed)
PATIENT NAME:  Nicholas Douglas, Nicholas Douglas MR#:  [id number] DATE OF BIRTH:  Feb 24, 1931  DATE OF PROCEDURE:  08/26/2012  PREOPERATIVE DIAGNOSIS: Acute appendicitis.  POSTOPERATIVE DIAGNOSIS: Acute appendicitis.  PROCEDURE PERFORMED: Laparoscopic appendectomy.   SURGEON: Jad Johansson A. Delrick Dehart, MD  ANESTHESIA: General.   ESTIMATED BLOOD LOSS: 10 mL.   COMPLICATIONS: None.   SPECIMEN: Appendix.   INDICATION FOR SURGERY: Mr. Huttner is a pleasant 81 year old male with a history of right lower quadrant pain, poor appetite and leukocytosis. He had a CT scan that was concerning for acute appendicitis. He was brought to the operating room suite for laparoscopic appendectomy.   DETAILS OF PROCEDURE: Informed consent was obtained from Mr. Landess. He was brought to the operating room suite. He was laid supine on the operating room table. He was induced. Endotracheal tube was placed. General anesthesia was administered. A timeout was then performed correctly identifying the patient name, operative site and procedure to be performed. An infraumbilical incision was made. It was deepened to the fascia. The fascia was incised. The peritoneum was entered. Two 0 Vicryl stay sutures were placed through the fascia. The abdomen was insufflated. There was a small amount of fat surrounding the incision, but I was able to safely make a track with blunt finger dissection. I then placed a 5 mm trocar in the left lower quadrant and in the suprapubic region. The appendix was visualized. It was thickened and inflamed. I used a [place] to make a hole in the mesoappendix at the base of the appendix. I then placed an endoscopic stapler across the base of the appendix and ligated it. I then used a combination of cautery and 2 white load staple fires to take the mesoappendix. The appendix was taken out through the supraumbilical port site with an Endo Catch bag. I then irrigated the abdomen and everything appeared to be hemostatic. I then  removed all trocars. The infraumbilical fascia was closed with a figure-of-eight 0 Vicryl suture. The skin was then closed with 4-0 Monocryl deep dermal. Dermabond was then placed over the wounds. The patient was then awoken, extubated and brought to the postanesthesia care unit. There were no immediate complications. Needle, sponge and instrument counts were correct at the end of the procedure.    ____________________________ Glena Norfolk. Lunabella Badgett, MD cal:jm D: 08/26/2012 18:23:18 ET T: 08/26/2012 19:59:15 ET JOB#: [id number]  cc: Harrell Gave A. Kandance Yano, MD, <Dictator> Floyde Parkins MD ELECTRONICALLY SIGNED 08/29/2012 11:34

## 2014-05-27 NOTE — H&P (Signed)
   Subjective/Chief Complaint RLQ pain, leukocytosis, anorexia   History of Present Illness Nicholas Douglas is a pleasant 81 yo M who presents with 1 day of periumbilical pain which is now focal RLQ pain.  He says that it began suddenly yesterday.  It has gotten worse and migrated to RLQ.  He says that he feels increased abdominal distention.  Has never had pain like this before.  + subjective fevers.  + nausea, last PO yesterday at 9 pm.  Had 2-3 small bm yesterday evening.   Past History CAD s/p MI 38 years ago, s/p angioplasty in past (last 1997) - sees Dr. [name] at [place] s/p left nephrectomy for kidney cancer 2006 HTN Hyperlipidemia   Past Medical Health Coronary Artery Disease, Hypertension, Cancer   Past Med/Surgical Hx:  Hyperlipidemia:   MI - Myocardial Infarct:   HTN:   L Kidney removed: 2006  Angioplasty:   ALLERGIES:  Morphine: Hives  Mevacor: Other  Procardia: Unknown  Family and Social History:  Family History Diabetes Mellitus  H/o aneurysm   Social History positive  tobacco, positive ETOH, Tobacco quit 1 week ago, Social EtOH   + Tobacco Current (within 1 year)   Place of [place], with Wife, golfs 4x/week   Review of Systems:  Subjective/Chief Complaint RLQ pain, nausea   Fever/Chills No   Cough No   Sputum No   Abdominal Pain Yes   Diarrhea No   Constipation No   Nausea/Vomiting Yes   SOB/DOE No   Chest Pain No   Dysuria No   Tolerating Diet No  Nauseated   Physical Exam:  GEN well developed, well nourished, no acute distress   HEENT pink conjunctivae, PERRL, hearing intact to voice   RESP normal resp effort  clear BS  no use of accessory muscles   CARD regular rate  no murmur  no thrills   ABD positive tenderness  no liver/spleen enlargement  no hernia  soft  rigid  normal BS  no Adominal Mass   SKIN normal to palpation, No rashes, No ulcers, skin turgor good   NEURO cranial nerves intact, negative rigidity, negative  tremor, follows commands   PSYCH alert, A+O to time, place, person, good insight    Assessment/Admission Diagnosis Nicholas Douglas is a pleasant 81 yo M admit with RLQ pain, thickening of appendix on CT.  Mild leukocytosis.  Clinically appendicitis.  Etiology of elevated lipase unknown.   Plan Will plan on OR for lap appendectomy   Electronic Signatures: Nicholas Douglas (MD)  (Signed 23-Jul-14 14:02)  Authored: CHIEF COMPLAINT and HISTORY, PAST MEDICAL/SURGIAL HISTORY, ALLERGIES, FAMILY AND SOCIAL HISTORY, REVIEW OF SYSTEMS, PHYSICAL EXAM, ASSESSMENT AND PLAN   Last Updated: 23-Jul-14 14:02 by Nicholas Douglas (MD)

## 2014-05-28 NOTE — Discharge Summary (Signed)
PATIENT NAME:  Nicholas Douglas, Nicholas Douglas MR#:  [id number] DATE OF BIRTH:  01/02/1932  DATE OF ADMISSION:  04/03/2013 DATE OF DISCHARGE:  04/05/2013  The patient was transferred to [facility] for evaluation for possible coronary artery bypass graft surgery versus high-risk angioplasty.   For a detailed note, please take a look at the history and physical done on admission by Dr. Waldron Labs.   DIAGNOSES AT DISCHARGE:  Are as follows: 1.  Chest pain/unstable angina. Significant two-vessel coronary artery disease. Previous history of coronary artery disease.  2.  Hypertension.  3.  Hyperlipidemia.  4.  Benign prostatic hypertrophy.  5.  History of chronic kidney disease stage II, status post previous nephrectomy due to renal cell carcinoma.   DISCHARGE MEDICATIONS: Are as follows: Tylenol 650 q.4 hours as needed, Norvasc 10 mg daily, atenolol 12.5 mg b.i.d. Zetia 10 mg daily, finasteride 5 mg daily, sublingual nitroglycerin as needed, Livalo 2 mg daily, omega-3 fatty acids 1 tab daily, Zofran 4 mg q.4 hours as needed, Protonix 40 mg daily, valsartan 80 mg daily, aspirin 162 mg daily.   Aten COURSE: Dr. Vick Frees from cardiology.   PERTINENT STUDIES DONE DURING THE HOSPITAL COURSE: Are as follows: A chest x-ray done on admission showing lungs hypoexpanded, mild bibasilar atelectasis. A nuclear medicine myocardial scan done on February 28th showing no significant wall motion abnormality, moderate risk scan, small area of borderline apical hypoperfusion with stress and some redistribution with rest. EF of 61%. No EKG changes concerning for ischemia. A cardiac catheterization, done on 04/05/2013, showing significant two-vessel coronary artery disease. The culprit vessel for unstable angina is a 99% mid LAD stenosis; however, the vessel is diffusely diseased in proximal and mid segment with heavy calcifications and another 70% proximally. The options include two-vessel coronary  artery bypass graft surgery or high-risk LAD PCI.   HOSPITAL COURSE: This is an 81 year old male with medical problems as mentioned above, presented to the hospital on 04/03/2013, secondary to chest pain.  1. Chest pain. The most likely cause of the patient's chest pain was angina, progressing to unstable angina. The patient did have significant risk factors given previous history of coronary artery disease and angioplasty, hypertension and hyperlipidemia. He was observed initially overnight in the hospital; had 3 sets of cardiac markers checked, which were negative. His Myoview did show a small area of hypoperfusion in the apical area. The patient was significantly symptomatic on ambulation, therefore, was kept in the hospital and underwent a cardiac catheterization, done on the morning of March 2nd. The cardiac cath did show significant two-vessel coronary artery disease. He was, therefore, transferred to [facility] for evaluation for possible two-vessel CABG versus high-risk PCI.  2.  Hyperlipidemia. The patient was maintained in the hospital on [place]. He will continue that.  3.  Hypertension. The patient was maintained on his atenolol, Norvasc and Diovan and remained hemodynamically stable. He may continue that.  4.  Benign prostatic hypertrophy. The patient was maintained on his finasteride. He will resume that.  5.  Chronic kidney disease stage II. The patient is status post nephrectomy due to renal cell carcinoma. His creatinine was improved with some IV fluid hydration. His creatinine further needs to be followed, as he did have a cardiac catheterization done today with some dye load.   The patient is a FULL CODE.   He is being transferred to [facility] urgently for possible evaluation of coronary artery bypass graft surgery versus  high-risk  PCI.   Time Spent with the discharge is 40 minutes.   ____________________________ Belia Heman. Verdell Carmine,  MD vjs:dmm D: 04/05/2013 11:43:00 ET T: 04/05/2013 12:15:58 ET JOB#: [id number]  cc: Belia Heman. Verdell Carmine, MD, <Dictator> Henreitta Leber MD ELECTRONICALLY SIGNED 04/18/2013 22:38

## 2014-05-28 NOTE — Consult Note (Signed)
Present Illness Patient is an 81 yo who is followed by Corky Downs in [place]  He was seen in clinic on 03/01/13.  Admitted yesterday for CP  the patient has known CAD.  Interventions date to 61, [place].  Most recent was 1997.  Last cath in 2011 showed normal LV function.  Mild distal inferior hypkinesis.  There is calcification of the coronary arteries with 30 to 40% prox LAD; 50% mid; 70 to 80% distal stenosis.  AV groove LCx with 70 and 50%  OM2 with 40%  RCA with 30-40%, 50% and distal 80 to 90%  in an acute marginal.  Last moview was in April 2013 and was low risk scan    The patient also has a history of hyperlipidemia with samll LDL particles.  He also has a history of HTN.  BP meds recently changed  Valsartan increased to 80 for 40.  He had stopped Imdur due to headach.  ASA decased to 81.    His weight has increased and he recently got back to exercising to try to lose.  He walks on treadmill at 2.5 m/hr.  Three days ago he was on treadmill and he got a tightness in chest.  Stopped and it went away.  Next day didn't exercise.  yesterday he says he just didn't feel right.  Took nap  Didn't do much   In bed he said when he turned to right or left side he had tightness.  Sitting in chair he was fine.  NTG and went away.    Came to ER.  Symptoms prior to previous interventions have been different (back pain, nausea)   Home Medications: Medication Instructions Status  Diovan 80 mg oral tablet 1 tab(s) orally once a day Active  Lovaza ethyl esters 1000 mg oral capsule 1 cap(s) orally 2 times a day Active  atenolol 25 mg oral tablet 0.5 tab(s) orally 2 times a day Active  Zetia 10 mg oral tablet 1 tab(s) orally once a day Active  amLODIPine 10 mg oral tablet 1 tab(s) orally once a day Active  aspirin 81 mg oral tablet 1 tab(s) orally 2 times a day Active  Livalo 2 mg oral tablet 1 tab(s) orally once a day Active  finasteride 5 mg oral tablet 1 tab(s) orally once a day Active     Morphine: Hives  Mevacor: Other  Procardia: Unknown  Case History and Physical Exam:  Chief Complaint Chest Pain   Past Medical Health Coronary Artery Disease, Hypertension, renal cancer, s/p nephrectomy   Past Surgical History Cardiac Catheterization  Appendectomy  nephrectomy   Primary Care Provider Other  Ellouise Newer, cardiology   Family History Non-Contributory   HEENT PERLA   Neck/Nodes R carotid bruit   Chest/Lungs Clear   Cardiovascular No Murmurs or Gallops   Abdomen Benign   Genitalia Not examined   Rectal Not examined   Musculoskeletal Full range of motion   Neurological Grossly WNL   Skin Warm   Nursing/Ancillary Notes: **Vital Signs.:   28-Feb-15 08:12  Vital Signs Type Pre Medication  Pulse Pulse 51  Systolic BP Systolic BP 161  Diastolic BP (mmHg) Diastolic BP (mmHg) 76  Mean BP 97    12:00  Vital Signs Type Routine  Temperature Temperature (F) 98.9  Temperature Source oral  Pulse Pulse 58  Respirations Respirations 18  Systolic BP Systolic BP 096  Diastolic BP (mmHg) Diastolic BP (mmHg) 83  Mean BP 105  Pulse Ox % Pulse  Ox % 94  Pulse Ox Activity Level  At rest  Oxygen Delivery Room Air/ 21 %   Hepatic:  28-Feb-15 00:54   Bilirubin, Total 0.6  Alkaline Phosphatase 56 (45-117 NOTE: New Reference Range 12/25/12)  SGPT (ALT) 68  SGOT (AST)  52  Total Protein, Serum 7.5  Albumin, Serum 3.4  Routine Chem:  28-Feb-15 00:54   Glucose, Serum 98  BUN  19  Creatinine (comp)  1.35  Sodium, Serum 140  Potassium, Serum 4.8  Chloride, Serum 107  CO2, Serum 27  Calcium (Total), Serum 8.7  Osmolality (calc) 282  eGFR (African American)  57  eGFR (Non-African American)  49 (eGFR values <15m/min/1.73 m2 may be an indication of chronic kidney disease (CKD). Calculated eGFR is useful in patients with stable renal function. The eGFR calculation will not be reliable in acutely ill patients when serum creatinine is changing rapidly. It is  not useful in  patients on dialysis. The eGFR calculation may not be applicable to patients at the low and high extremes of body sizes, pregnant women, and vegetarians.)  Result Comment POTASSIUM/AST/CK TOTAL - Slight hemolysis, interpret results with  - caution.  Result(s) reported on 03 Apr 2013 at 01:19AM.  Anion Gap  6  Cardiac:  28-Feb-15 00:54   CPK-MB, Serum 0.9 (Result(s) reported on 03 Apr 2013 at 01:51AM.)  Troponin I < 0.02 (0.00-0.05 0.05 ng/mL or less: NEGATIVE  Repeat testing in 3-6 hrs  if clinically indicated. >0.05 ng/mL: POTENTIAL  MYOCARDIAL INJURY. Repeat  testing in 3-6 hrs if  clinically indicated. NOTE: An increase or decrease  of 30% or more on serial  testing suggests a  clinically important change)  CK, Total 206 (39-308 NOTE: NEW REFERENCE RANGE  03/08/2013)  Routine UA:  28-Feb-15 00:54   Color (UA) Yellow  Clarity (UA) Clear  Glucose (UA) Negative  Bilirubin (UA) Negative  Ketones (UA) Negative  Specific Gravity (UA) 1.014  Blood (UA) Negative  pH (UA) 6.0  Protein (UA) Negative  Nitrite (UA) Negative  Leukocyte Esterase (UA) Negative (Result(s) reported on 03 Apr 2013 at 01:47AM.)  RBC (UA) 1 /HPF  WBC (UA) 1 /HPF  Bacteria (UA) NONE SEEN  Epithelial Cells (UA) NONE SEEN  Result(s) reported on 03 Apr 2013 at 01:47AM.  Routine Hem:  28-Feb-15 00:54   WBC (CBC) 7.0  RBC (CBC) 5.33  Hemoglobin (CBC) 15.6  Hematocrit (CBC) 47.1  Platelet Count (CBC) 194  MCV 88  MCH 29.3  MCHC 33.2  RDW 13.5  Neutrophil % 55.4  Lymphocyte % 22.8  Monocyte % 14.0  Eosinophil % 5.1  Basophil % 2.7  Neutrophil # 3.9  Lymphocyte # 1.6  Monocyte # 1.0  Eosinophil # 0.4  Basophil #  0.2 (Result(s) reported on 03 Apr 2013 at 01:19AM.)   Nuclear Med:    28-Feb-15 11:47, NM MYOCARDIAL SCAN  NM MYOCARDIAL SCAN   REASON FOR EXAM:    chest pain  COMMENTS:       PROCEDURE: NM  - NM MYOCARDIAL SCAN  - Apr 03 2013 11:47AM     RESULT: Nuclear  Cardiology Report    Patient Demographics         Name: Nicholas PUTMANDate: 04/03/2013   Patient ID: [id number]                  Report Date: 04/03/2013  DOB: March 10, 1931 Age: 84 years       Height:          Sex: M                             Weight:  Accession #: [id number]                        Race:    Ordering Physician: MD Albertine Patricia  Diagnosing Physician: [id number] Bartholome Bill MD  Indications  The patient was imaged for the following indications:  Assessment of acute chest pain.    Clinical History  39.81 year old M with known coronary artery disease.  Cardiac risk factors include: hypertension, history of smoking and   hypercholesterolemia.  Images were obtained using Rest Tc-48mstress Tc-941m day protocol.    Procedure  Pharm stress protocol was followed due to the patient was unable to   exercise. Patient was injected with .4 mg Regadenoson.  Regadenosine completing 1 minute 0 seconds, achieving an estimated   workload of 1 metabolic equivalents (METS). The test was terminated due     to End of Protocol. The heart rate was 53 beats per minute at rest and   increased to 72 beatsat peak exercise, which was 51 % of the maximum   predicted heart rate. The resting blood pressure was 105/71 mm/Hg and the   stress blood pressure was 146/8478mg. During the procedure symptoms with   nausea and with headache.  Myocardial perfusion imaging was performed following the injection of   13.583 MCi of 34m56mestamibi at 08:52.  The patient was injected with 32.869 MCi of 34mT96mstamibi at 9:58 for   the stress portion of the exam.    Stress Test Findings    The following table summarizes the findings:  +-------------+-------------------+-------------------+                 STRESS EKG DATA     REST EKG DATA     +-------------+-------------------+-------------------+  Test Status  Normal             Normal                +-------------+-------------------+-------------------+  Rhythm       Normal sinus rhythmNormal sinus rhythm  +-------------+-------------------+-------------------+  IV ConductionNormal             Normal               +-------------+-------------------+-------------------+  Arrhythmias  None               None                 +-------------+-------------------+-------------------+  ST Response  Normal                                  +-------------+-------------------+-------------------+    Overall Impression  The overall study imaging quality was deemed to be good.  The left ventricular global function was normal.  This myocardial perfusion scan showed no evidence of pathology and has a   normal appearance. The stress to rest volume ratio is 1.04 forthe left   ventricle.  There is no artifact noted on this study.  No significant wall motion abnormality noted.  The estimated ejection fraction is 61%.  There are no EKG changes concerning for ischemia.  Overall,  this is a Moderate risk scan.  Clinical correlation is recommended.  Small area of borderline apical hypoperfusion with stress with some   redistribution with rest. Suggest clinical correlation.    Summary   1. Clinical correlation is recommended.   2. No significant wall motion abnormality noted.   3. Overall, this is a Moderate risk scan.   4. Small area of borderline apical hypoperfusion with stress with some   redistribution with rest. Suggest clinical correlation.   5. The estimated ejection fraction is 61%.   6. The left ventricular global function was normal.   7. There are no EKG changes concerning for ischemia.   8. There is no artifact noted on this study.  Diagnosing Physician: [id number] Bartholome Bill MD  Electronically signed at 11:52:01 AM on 04/03/2013    *** Final ***    IMPRESSION: .      Verified By: Teodoro Spray, M.D., MD    Impression Patient is an 70 with known CAD  Presents with tightness in  chest  One episode with exertion.  Secontd at rest.  Yesterday he did not feel well in general. he has r/o for MI Trop negative  Labs also signif for Cr 1.35 (he has 1 kidney) which is up from baseline. EKG on arrival showed NSR.  No acute changes.   I have ordered a BNP.  Pending Myoview this AM is noted above.  Report of small region at apex would correlate with the patient's known disease. from 2011.  Patinet walked in hallway and developed symptoms of tightness similar to when  on treadmill  Based on this I would recomm L heart cath  He should remain in hospital until done.  Will need prehydration with IV fluids prior given 1 kidney and Cr.  Patient understands. I   I have asked the patient to walk around floor to see how feels  BP went up to 153  he did experience some tightness like he felt on treadmill   Plan I would recom NTG paste for now.  Continue other meds.   I will cut back on valsartan given Cr. Plan for cath on Monday.  Bedrest until then.  heparin tid until then.   Electronic Signatures: Dorris Carnes (MD)  (Signed 28-Feb-15 13:52)  Authored: General Aspect/Present Illness, Home Medications, Allergies, History and Physical Exam, Vital Signs, Labs, Radiology, Impression/Plan   Last Updated: 28-Feb-15 13:52 by Dorris Carnes (MD)

## 2014-05-28 NOTE — Consult Note (Signed)
Chief Complaint:  Subjective/Chief Complaint Patient developed CP about 30 min ago  Given NTG with relief  Now back  About 6/10  More sharp than previous  NO worsening with inspiration.   VITAL SIGNS/ANCILLARY NOTES: **Vital Signs.:   01-Mar-15 11:50  Vital Signs Type Routine  Temperature Temperature (F) 97.5  Celsius 36.3  Temperature Source oral  Pulse Pulse 54  Respirations Respirations 18  Systolic BP Systolic BP 626  Diastolic BP (mmHg) Diastolic BP (mmHg) 80  Mean BP 108  Pulse Ox % Pulse Ox % 95  Pulse Ox Activity Level  At rest  Oxygen Delivery Room Air/ 21 %    94:85  Systolic BP Systolic BP 462  Diastolic BP (mmHg) Diastolic BP (mmHg) 95  Mean BP 124  *Intake and Output.:   Daily 01-Mar-15 07:00  Grand Totals Intake:  240 Output:  550    Net:  -310 24 Hr.:  -310  Oral Intake      In:  240  Urine ml     Out:  550  Length of Stay Totals Intake:  240 Output:  675    Net:  -435   Physical Exam:  GEN no acute distress   HEENT NCAT   NECK JVP is normal   RESP clear BS   CARD regular rate   ABD denies tenderness  soft   EXTR negative edema   Lab Results: Routine Chem:  01-Mar-15 05:28   Glucose, Serum 98  BUN 18  Creatinine (comp) 1.20  Sodium, Serum 137  Potassium, Serum 3.9  Chloride, Serum 106  CO2, Serum 28  Calcium (Total), Serum 8.5  Anion Gap  3  Osmolality (calc) 276  eGFR (African American) >60  eGFR (Non-African American)  57 (eGFR values <31m/min/1.73 m2 may be an indication of chronic kidney disease (CKD). Calculated eGFR is useful in patients with stable renal function. The eGFR calculation will not be reliable in acutely ill patients when serum creatinine is changing rapidly. It is not useful in  patients on dialysis. The eGFR calculation may not be applicable to patients at the low and high extremes of body sizes, pregnant women, and vegetarians.)  Magnesium, Serum 2.0 (1.8-2.4 THERAPEUTIC RANGE: 4-7 mg/dL TOXIC: > 10  mg/dL  -----------------------)  Cardiac:  28-Feb-15 00:54   CPK-MB, Serum 0.9 (Result(s) reported on 03 Apr 2013 at 01:51AM.)  Troponin I < 0.02 (0.00-0.05 0.05 ng/mL or less: NEGATIVE  Repeat testing in 3-6 hrs  if clinically indicated. >0.05 ng/mL: POTENTIAL  MYOCARDIAL INJURY. Repeat  testing in 3-6 hrs if  clinically indicated. NOTE: An increase or decrease  of 30% or more on serial  testing suggests a  clinically important change)    04:42   CPK-MB, Serum 1.1 (Result(s) reported on 03 Apr 2013 at 05:34AM.)  Troponin I < 0.02 (0.00-0.05 0.05 ng/mL or less: NEGATIVE  Repeat testing in 3-6 hrs  if clinically indicated. >0.05 ng/mL: POTENTIAL  MYOCARDIAL INJURY. Repeat  testing in 3-6 hrs if  clinically indicated. NOTE: An increase or decrease  of 30% or more on serial  testing suggests a  clinically important change)    07:54   CPK-MB, Serum 1.0 (Result(s) reported on 03 Apr 2013 at 08:24AM.)  Troponin I < 0.02 (0.00-0.05 0.05 ng/mL or less: NEGATIVE  Repeat testing in 3-6 hrs  if clinically indicated. >0.05 ng/mL: POTENTIAL  MYOCARDIAL INJURY. Repeat  testing in 3-6 hrs if  clinically indicated. NOTE: An increase or decrease  of 30% or  more on serial  testing suggests a  clinically important change)  Routine Hem:  01-Mar-15 05:28   WBC (CBC) 6.0  RBC (CBC) 4.99  Hemoglobin (CBC) 14.6  Hematocrit (CBC) 43.8  Platelet Count (CBC) 202  MCV 88  MCH 29.3  MCHC 33.4  RDW 13.1  Neutrophil % 51.3  Lymphocyte % 31.4  Monocyte % 12.2  Eosinophil % 4.6  Basophil % 0.5  Neutrophil # 3.1  Lymphocyte # 1.9  Monocyte # 0.7  Eosinophil # 0.3  Basophil # 0.0 (Result(s) reported on 04 Apr 2013 at 06:16AM.)   Assessment/Plan:  Assessment/Plan:  Assessment Patient with known CAD  New onset chest pressure over the past few days  Myoview with apical ischemia   R/O for MI but now with recurrent discomfort at rest--different than previous rest discomfort Plan for  EKG  NTG paste placed.   Will start IV heparin (5000 push and 1000U per hour) Follow EKG and symptoms   Hope to get pain free with plan for cath in AM aftier IV hydration   If unable to control or EKG with ST elevation would tx to [employer] for LHC sooner  2.  Renal  Patient with 1 kidney  Cr is improved  1.2  Will hydrate   Electronic Signatures: Dorris Carnes (MD)  (Signed 01-Mar-15 14:09)  Authored: Chief Complaint, VITAL SIGNS/ANCILLARY NOTES, Physical Exam, Lab Results, Assessment/Plan   Last Updated: 01-Mar-15 14:09 by Dorris Carnes (MD)

## 2014-05-28 NOTE — H&P (Signed)
PATIENT NAME:  Nicholas Douglas, Nicholas Douglas MR#:  [id number] DATE OF BIRTH:  August 31, 1931  DATE OF ADMISSION:  04/03/2013  REFERRING PHYSICIAN:  Dr. Marjean Donna.   PRIMARY CARE PHYSICIAN:  Dr. Baldemar Lenis.  PRIMARY CARDIOLOGIST:  Dr. [name] at [facility], current at [facility].   CHIEF COMPLAINT:  Chest pain.   HISTORY OF PRESENT ILLNESS:  This is an 81 year old male with known history of coronary artery disease, status post stenting in the past, who presents with complaints of chest pain, reported has been intermittent over the last two days, reports it started before two days while he was on the treadmill for five minutes, then started having midsternal chest tightness, nonradiating, accompanied by mild nausea and shortness of breath, but denies any palpitation, syncope, or diaphoresis, and reports it did resolve with rest, reports he had another episode happening today at rest, same thing, midsternal tightness/pressure like quality, nonradiating, with mild nausea and shortness of breath, resolved when he took 1 pill of sublingual nitro, currently the patient denies any chest pain, received one pill of 324 mg of aspirin, reports last stress test he had done before four years, currently he is chest pain-free, EKG did not show any acute findings, as well his first troponin was negative, hospitalist service requested to admit the patient for further evaluation, denies any leg swelling, any hemoptysis, any recent long travel.   PAST MEDICAL HISTORY: 1.  Coronary artery disease, status post stenting.  2.  History of costochondritis.  3.  Hypertension.  4.  Hyperlipidemia.  5.  History of abnormal LFTs.  6.  BPH.  7.  History of renal cancer with left nephrectomy 2006.   PAST SURGICAL HISTORY: 1.  Left kidney removed.  2.  Appendectomy.   FAMILY HISTORY:  Significant for hypertension in his mother.   SOCIAL HISTORY:  The patient is married, lives at home with his wife.  No history of  smoking, alcohol or illicit drug use.   ALLERGIES:  MEVACOR, MORPHINE AND PROCARDIA.   HOME MEDICATIONS: 1.  Aspirin 162 mg oral daily.  2.  Finasteride 5 mg oral daily.  3.  Diovan 80 mg oral daily.  4.  Atenolol 12.5 mg oral 2 times a day.  5.  Livalo 2 mg oral daily.   6.  Zetia 10 mg oral daily.  7.  Amlodipine 10 mg oral daily.  8.  Lovaza 1000 mg oral 2 times a day.   REVIEW OF SYSTEMS:  CONSTITUTIONAL:  Denies fever, chills, fatigue, weakness, weight gain, weight loss.  EYES:  Denies blurry vision, double vision, inflammation, glaucoma.  EARS, NOSE, THROAT:  Denies tinnitus, ear pain, hearing loss, epistaxis or discharge.  RESPIRATORY:  Denies cough, wheezing, hemoptysis, or COPD, reports mild dyspnea.  CARDIOVASCULAR:  Reports chest pain, currently resolved.  Denies any edema, arrhythmia, palpitations, syncope.  GASTROINTESTINAL:  Mild nausea.  No vomiting, diarrhea, abdominal pain, hematemesis, melena.  GENITOURINARY:  No dysuria, hematuria, renal colic.  ENDOCRINE:  Denies any polyuria, polydipsia, heat or cold intolerance.  HEMATOLOGY:  Denies anemia, easy bruising, bleeding diathesis.  INTEGUMENTARY:  Denies any acne, rash or skin lesion.  MUSCULOSKELETAL:  Denies any joint tenderness, arthritis, cramps or gout.  NEUROLOGIC:  Denies CVA, TIA, headache, dementia, ataxia.  PSYCHIATRIC:  Denies anxiety, insomnia or bipolar disorder.   PHYSICAL EXAMINATION: VITAL SIGNS:  Temperature 97.9, pulse 52, respiratory rate 18, blood pressure 120/73, saturating 94% on room air.  GENERAL:  Well-nourished male who looks comfortable in bed, in  no apparent distress.  HEENT:  Head atraumatic, normocephalic.  Pupils equal, reactive to light.  Pink conjunctivae.  Anicteric sclerae.  Moist oral mucosa.  NECK:  Supple.  No thyromegaly.  No JVD.  No carotid bruits.  CHEST:  Good air entry bilaterally.  No wheezing, rales, rhonchi.  No tenderness on palpation.  CARDIOVASCULAR:  S1, S2 heard.   No rubs, murmurs or gallops.  ABDOMEN:  Soft, nontender, nondistended.  Bowel sounds present.  EXTREMITIES:  No edema.  No clubbing.  No cyanosis.  PSYCHIATRIC:  Appropriate affect.  Awake, alert x 3.  Intact judgment and insight.  NEUROLOGIC:  Cranial nerves grossly intact.  Motor 5 out of 5.  No focal deficits.  SKIN:  Normal skin turgor.  Warm and dry.  LYMPHATIC:  No cervical lymphadenopathy.  MUSCULOSKELETAL:  No joint effusion or erythema.   PERTINENT LABORATORY DATA:  Glucose 98, BUN 19, creatinine 1.35, sodium 140, potassium 4.8, chloride 107, CO2 27, ALT 68, AST 52, alk phos 56.  Troponin less than 0.02.  White blood cells 7, hemoglobin 15.6, hematocrit 47.1, platelets 194.  Urinalysis negative for leukocyte esterase and nitrite.  EKG showing normal sinus rhythm without significant ST abnormalities at 60 beats per minute.   ASSESSMENT AND PLAN: 1.  Chest pain, currently resolved, the patient presents with typical chest pain, resolved with sublingual nitroglycerin, so he will be admitted to telemetry unit.  We will continue to cycle his cardiac enzymes and if they are negative we will schedule him for a stress test in the morning, as well consult [facility] to evaluate the patient, if there is any further work-up is indicated at this point, he already received 324 of aspirin.  We will continue him on 162 mg of aspirin, as needed sublingual nitroglycerin and he is already on anti-hyperlipidemia medication.  He is on atenolol and he is on Diovan.  2.  History of coronary artery disease, as per above currently his chest pain is resolved and he is on optimal medical management.  3.  History of hypertension.  Blood pressure is acceptable.  Continue home medication.  4.  Hyperlipidemia.  Continue with Lovaza, Zetia and Livalo.  5.  History of benign prostatic hypertrophy.  Continue with finasteride.  6.  Deep vein thrombosis prophylaxis.  SubQ heparin. 7.  Gastrointestinal prophylaxis, on  proton pump inhibitor.  8.  CODE STATUS:  The patient does not have a LIVING WILL, does not have healthcare power of attorney, reports he does not wish to be kept alive on life support for any period of time, but he wants [place] to attempt to revive him if needed so he is a FULL CODE status.   Total time spent on admission and patient care 50 minutes.    ____________________________ Albertine Patricia, MD dse:ea D: 04/03/2013 03:24:56 ET T: 04/03/2013 04:37:56 ET JOB#: [id number]  cc: Albertine Patricia, MD, <Dictator> Milayah Krell Graciela Husbands MD ELECTRONICALLY SIGNED 04/06/2013 0:34

## 2014-06-28 ENCOUNTER — Telehealth (HOSPITAL_COMMUNITY): Payer: Self-pay

## 2014-06-28 NOTE — Telephone Encounter (Signed)
Encounter complete. 

## 2014-06-29 ENCOUNTER — Telehealth (HOSPITAL_COMMUNITY): Payer: Self-pay

## 2014-06-29 NOTE — Telephone Encounter (Signed)
Encounter complete. 

## 2014-06-30 ENCOUNTER — Ambulatory Visit (HOSPITAL_COMMUNITY)
Admission: RE | Admit: 2014-06-30 | Discharge: 2014-06-30 | Disposition: A | Discharge status: Home / Self Care | Payer: Medicare Other | Source: Ambulatory Visit | Attending: Cardiology | Admitting: Cardiology

## 2014-06-30 DIAGNOSIS — R0609 Other forms of dyspnea: Secondary | ICD-10-CM | POA: Insufficient documentation

## 2014-06-30 DIAGNOSIS — E669 Obesity, unspecified: Secondary | ICD-10-CM | POA: Insufficient documentation

## 2014-06-30 DIAGNOSIS — I10 Essential (primary) hypertension: Secondary | ICD-10-CM | POA: Diagnosis not present

## 2014-06-30 DIAGNOSIS — Z955 Presence of coronary angioplasty implant and graft: Secondary | ICD-10-CM | POA: Diagnosis not present

## 2014-06-30 DIAGNOSIS — Z6830 Body mass index (BMI) 30.0-30.9, adult: Secondary | ICD-10-CM | POA: Diagnosis not present

## 2014-06-30 DIAGNOSIS — I2510 Atherosclerotic heart disease of native coronary artery without angina pectoris: Secondary | ICD-10-CM | POA: Diagnosis not present

## 2014-06-30 DIAGNOSIS — Z720 Tobacco use: Secondary | ICD-10-CM | POA: Diagnosis not present

## 2014-06-30 LAB — MYOCARDIAL PERFUSION IMAGING
CHL CUP RESTING HR STRESS: 53 {beats}/min
CHL CUP STRESS STAGE 1 DBP: 82 mmHg
CHL CUP STRESS STAGE 1 HR: 52 {beats}/min
CHL CUP STRESS STAGE 4 SBP: 134 mmHg
CSEPEW: 1 METS
LV dias vol: 95 mL
LV sys vol: 40 mL
NUC STRESS TID: 1.04
Nuc Stress EF: 57 %
Peak HR: 57 {beats}/min
Percent of predicted max HR: 41 %
SDS: 0
SRS: 1
SSS: 1
Stage 1 Grade: 0 %
Stage 1 SBP: 151 mmHg
Stage 1 Speed: 0 mph
Stage 2 Grade: 0 %
Stage 2 HR: 52 {beats}/min
Stage 2 Speed: 0 mph
Stage 3 DBP: 76 mmHg
Stage 3 Grade: 0 %
Stage 3 HR: 57 {beats}/min
Stage 3 SBP: 130 mmHg
Stage 3 Speed: 0 mph
Stage 4 DBP: 77 mmHg
Stage 4 Grade: 0 %
Stage 4 HR: 56 {beats}/min
Stage 4 Speed: 0 mph

## 2014-06-30 MED ORDER — TECHNETIUM TC 99M SESTAMIBI GENERIC - CARDIOLITE
31.0000 | Freq: Once | INTRAVENOUS | Status: AC | PRN
  Administered 2014-06-30: 31 via INTRAVENOUS

## 2014-06-30 MED ORDER — TECHNETIUM TC 99M SESTAMIBI GENERIC - CARDIOLITE
10.4000 | Freq: Once | INTRAVENOUS | Status: AC | PRN
  Administered 2014-06-30: 10 via INTRAVENOUS

## 2014-06-30 MED ORDER — REGADENOSON 0.4 MG/5ML IV SOLN
0.4000 mg | Freq: Once | INTRAVENOUS | Status: AC
  Administered 2014-06-30: 0.4 mg via INTRAVENOUS

## 2014-07-06 ENCOUNTER — Telehealth: Payer: Self-pay | Admitting: Cardiovascular Disease

## 2014-07-06 NOTE — Telephone Encounter (Signed)
Returned call to patient myoview results given. 

## 2014-07-06 NOTE — Telephone Encounter (Signed)
Pt wants the result of his Stress test from 06-30-14 please.

## 2014-07-29 ENCOUNTER — Other Ambulatory Visit: Payer: Self-pay | Admitting: Cardiovascular Disease

## 2014-07-30 LAB — HEPATIC FUNCTION PANEL
ALK PHOS: 60 IU/L (ref 39–117)
ALT: 35 IU/L (ref 0–44)
AST: 31 IU/L (ref 0–40)
Albumin: 4.5 g/dL (ref 3.5–4.7)
Bilirubin Total: 0.8 mg/dL (ref 0.0–1.2)
Bilirubin, Direct: 0.17 mg/dL (ref 0.00–0.40)
Total Protein: 7.1 g/dL (ref 6.0–8.5)

## 2014-08-04 ENCOUNTER — Ambulatory Visit (INDEPENDENT_AMBULATORY_CARE_PROVIDER_SITE_OTHER): Payer: Medicare Other | Admitting: Cardiovascular Disease

## 2014-08-04 ENCOUNTER — Encounter: Payer: Self-pay | Admitting: Cardiovascular Disease

## 2014-08-04 VITALS — BP 122/78 | HR 58 | Ht 67.5 in | Wt 191.4 lb

## 2014-08-04 DIAGNOSIS — Z79899 Other long term (current) drug therapy: Secondary | ICD-10-CM

## 2014-08-04 DIAGNOSIS — I25810 Atherosclerosis of coronary artery bypass graft(s) without angina pectoris: Secondary | ICD-10-CM

## 2014-08-04 DIAGNOSIS — C642 Malignant neoplasm of left kidney, except renal pelvis: Secondary | ICD-10-CM

## 2014-08-04 DIAGNOSIS — E785 Hyperlipidemia, unspecified: Secondary | ICD-10-CM

## 2014-08-04 DIAGNOSIS — I10 Essential (primary) hypertension: Secondary | ICD-10-CM

## 2014-08-04 DIAGNOSIS — I2510 Atherosclerotic heart disease of native coronary artery without angina pectoris: Secondary | ICD-10-CM

## 2014-08-04 MED ORDER — NITROGLYCERIN 0.4 MG SL SUBL: 0.4000 mg | SUBLINGUAL_TABLET | SUBLINGUAL | Status: DC | PRN

## 2014-08-04 NOTE — Patient Instructions (Signed)
Your physician recommends that you return for lab work FASTING.  Your physician wants you to follow-up in: 6-8 MONTHS. You will receive a reminder letter in the mail two months in advance. If you don't receive a letter, please call our office to schedule the follow-up appointment.

## 2014-08-04 NOTE — Progress Notes (Signed)
Patient ID: Nicholas Douglas, male   DOB: 1931-08-31, 81 y.o.   MRN: [id number]     HPI: Nicholas Douglas is an 81 y.o. Caucasian male who presents to the office today for a six-month follow-up evaluation.  Nicholas Douglas has known CAD and underwent numerous interventions dating back to 90, 1991, 1993, and in 1997. Cardiac catheterization in 2011 which showed preserved LV function with mild residual distal inferior apical hypocontractility. There is evidence for coronary calcification with segmental narrowing of his LAD of 30-40% proximally, 50% diffusely, in the midsegment, and 70-80% in the distal region, he had AV groove circumflex stenoses of 70 and 50% with a 40% OM 2 stenosis, and a 30-40 and 50% RCA stenoses with 80-90% stenosis in the acute marginal branch. He had been on medical therapy.  He  presented to [facility] in March 2015 with class IV angina and catheterization demonstrated severe 2 vessel CAD with significant current calcification of the LAD with up to 95% mid LAD stenosis and diffuse proximal stenosis. He underwent successful high-speed rotational atherectomy the following day by Dr. Martinique and a 1.5 mm bur was used. Mid LAD was stented with a 2.75x38 mm Promus stent in the proximal LAD was stented with a 3.0x28 mm Promus stent. Medical therapy was recommended for his distal LAD stenosis. He has only one kidney and staged intervention to the left circumflex coronary artery was recommended.  He underwent staged left circumflex PCI on 04/27/2013 by me with successful high-speed rotational atherectomy with a 1.5 and 1.75 mm burr, and ultimately had a 2.5x28 mm Promus premier DES stent inserted into the circumflex vessel, which was post dilated to approximately 2.6 mm.  Subsequently he has felt significantly improved with resolution of any chest pain  He also has a history of significant hyperlipidemia and has had significant increased number of small LDL particles and insulin resistance. This  seemed to markedly improve with the addition of Niaspan added to zetia and lovaza.  Remotely, he had been on statins consisting of Lipitor and subsequently Crestor. He did have transient LFT elevation. He also concerns of possible risk of developing dementia with statin therapy.  He had  some episodes of Niaspan induced diffuse flushing. He wanted to stop taking his Niaspan. He did have subsequent NMR off Niaspan and done just on Zetia and as well as 2 g of Lovaza.  This showed increased abnormalities such that his total cholesterol was 201, LDL had risen to 124, but he now had LDL small particles which have increased from 685 to 1348. Laboratory on 10/26/2012: LDL particle number was elevated at 1657, LDL cholesterol 112 triglycerides 155 and total cholesterol 190. He continued to be insulin resistant with insulin resistance scored 65. When I last saw him, we elected to try Livalo and ultimately titrated this to 4 mg daily to take in addition to his Zetia 10 mg. Laboratory on 02/22/2013: LDL particle number was  markedly improved at 774 with an HDL of 40 calculated LDL 50 small LDL particle #417. Insulin resistance score was also improved at 51.  In July 2015 he developed abdominal discomfort and was found to have acute appendicitis. On CT imaging he was also noted to have prostate enlargement with nodularity and thickening of the bladder base and cystoscopy was suggested for further evaluation. He is status post left nephrectomy. He also was noted to have a 2.3 cm infrarenal suprailiac abdominal aortic aneurysm.  Over the past year, he has been without  anginal symptoms.  He notes some mild shortness of breath with activity.  He has been on Livalo 2 mg, Zetia 10 mg for hyperlipidemia.  Lab work on 12/22/2013 showed a cholesterol 155, triglycerides 1:30, HDL 45, LDL 84.  He has been on amlodipine 10 mg atenolol 12.5 mg twice a day and valsartan 80 mg daily for blood pressure.  He continues to be on aspirin and  Plavix for dual antiplatelet therapy.    Presently, he is playing golf at least 4 days per week.  He is typically scoring in the 70s.  He uses a golf cart.  He underwent a nuclear perfusion study on 06/30/2014.  This was normal and revealed normal perfusion and function with an ejection fraction of 57%.  He presents for evaluation.     Past Medical History  Diagnosis Date  . Hypertension   . Personal history of colonic polyps   . Other abnormal blood chemistry   . Other nonspecific abnormal serum enzyme levels   . Coronary atherosclerosis of unspecified type of vessel, native or graft   . Impotence of organic origin   . Tobacco use disorder   . Pure hypercholesterolemia   . Internal hemorrhoids without mention of complication   . Melanoma 06/2010    Left arm  . Myocardial infarction 1976  . Unspecified congenital cystic kidney disease   . Malignant neoplasm of kidney, except pelvis   . Calculus of kidney     "that's how they found the cancer" (04/27/2013)    Past Surgical History  Procedure Laterality Date  . Nephrectomy Left 2006  . Melanoma excision Left ~ 2007    "arm"  . Colonoscopy  10/05/2008    single polyp, IH.  Nicholas Douglas.  Repeat in 3 years.  . Carotid dopplers  03/08/2011    minimal plaque formation B. Symptoms: dizziness.  . Cardiolite  05/06/2011    low risk study; normal EF.  SE H&V.  Marland Kitchen Coronary angioplasty      "I've had 4" (04/27/2013)  . Coronary angioplasty with stent placement  04/2013; 04/27/2013    "2 + 1" (04/27/2013)  . Appendectomy  2014  . Eye surgery  11/04/2013    Cataract surgery B; Beavis.  Marland Kitchen Percutaneous coronary rotoblator intervention (pci-r) N/A 04/06/2013    Procedure: PERCUTANEOUS CORONARY ROTOBLATOR INTERVENTION (PCI-R);  Surgeon: [name], MD;  Location: [facility] CATH LAB;  Service: Cardiovascular;  Laterality: N/A;  . Percutaneous coronary stent intervention (pci-s) N/A 04/27/2013    Procedure: PERCUTANEOUS CORONARY STENT INTERVENTION (PCI-S);   Surgeon: [name], MD;  Location: [facility] CATH LAB;  Service: Cardiovascular;  Laterality: N/A;    Allergies  Allergen Reactions  . Lovastatin Other (See Comments)    Elevated liver enzymes  . Morphine Hives  . Morphine And Related Hives  . Nifedipine Other (See Comments)    Elevated liver enzymes    Current Outpatient Prescriptions  Medication Sig Dispense Refill  . amLODipine (NORVASC) 10 MG tablet Take 1 tablet (10 mg total) by mouth daily. 90 tablet 1  . aspirin 81 MG tablet Take 81 mg by mouth daily.    Marland Kitchen atenolol (TENORMIN) 25 MG tablet Take 0.5 tablets (12.5 mg total) by mouth 2 (two) times daily. 90 tablet 2  . clopidogrel (PLAVIX) 75 MG tablet Take 1 tablet (75 mg total) by mouth daily. 90 tablet 3  . ezetimibe (ZETIA) 10 MG tablet Take 1 tablet (10 mg total) by mouth daily. 90 tablet 3  .  finasteride (PROSCAR) 5 MG tablet Take 1 tablet (5 mg total) by mouth daily. 90 tablet 3  . Icosapent Ethyl 1 G CAPS Take 1 g by mouth 2 (two) times daily. 180 capsule 3  . nitroGLYCERIN (NITROSTAT) 0.4 MG SL tablet Place 1 tablet (0.4 mg total) under the tongue every 5 (five) minutes as needed for chest pain. 25 tablet 2  . Pitavastatin Calcium (LIVALO) 2 MG TABS Take 1 tablet (2 mg total) by mouth once. 90 tablet 3  . valsartan (DIOVAN) 80 MG tablet Take 1 tablet (80 mg total) by mouth daily. 90 tablet 3   No current facility-administered medications for this visit.    Socially he remains active. He is married has 5 children 10 grandchildren 2 great-grandchildren. Is no alcohol use. He typically scores below 75 and plays golf 4 days per week.  He has turned 82 any typically scores in the 70s, better than his age.  ROS General: Negative; No fevers, chills, or night sweats;  HEENT: Negative; No changes in vision or hearing, sinus congestion, difficulty swallowing Pulmonary: Negative; No cough, wheezing, shortness of breath, hemoptysis Cardiovascular: See history of present illness; No  chest pain, presyncope, syncope, palpatations GI: Negative; No nausea, vomiting, diarrhea, or abdominal pain GU: Negative; No dysuria, hematuria, or difficulty voiding Musculoskeletal: Negative; no myalgias, joint pain, or weakness Hematologic/Oncology: Negative; no easy bruising, bleeding Endocrine: Negative; no heat/cold intolerance; no diabetes Neuro: Negative; no changes in balance, headaches Skin: Negative; No rashes or skin lesions Psychiatric: Negative; No behavioral problems, depression Sleep: Negative; No snoring, daytime sleepiness, hypersomnolence, bruxism, restless legs, hypnogognic hallucinations, no cataplexy Other comprehensive 14 point system review is negative.   PE BP 122/78 mmHg  Pulse 58  Ht 5' 7.5" (1.715 m)  Wt 191 lb 6.4 oz (86.818 kg)  BMI 29.52 kg/m2  General: Alert, oriented, no distress.  Skin: normal turgor, no rashes HEENT: Normocephalic, atraumatic. Pupils round and reactive; sclera anicteric;no lid lag.  Nose without nasal septal hypertrophy Mouth/Parynx benign; Mallinpatti scale 2 Neck: No JVD, no carotid bruits; normal carotid up stroke Lungs: clear to ausculatation and percussion; no wheezing or rales Chest wall: Nontender to palpation the Heart: RRR, s1 s2 normal 1/6 sem; no diastolic murmur. No rubs thrills or heaves. Abdomen: soft, nontender; no hepatosplenomehaly, BS+; abdominal aorta nontender and not dilated by palpation. Back: No CVA tenderness. Pulses 2+ Extremities: no clubbing cyanosis or edema, Homan's sign negative  Neurologic: grossly nonfocal Psychological: Normal affect and mood  ECG (independently read by me): Sinus bradycardia 58 bpm.  Normal intervals.  No ectopy. Non-specific T change aVL  November 2015 ECG (independently read by me): Sinus bradycardia 54 bpm.  No significant ST-T changes.  Normal intervals.  May 2015 ECG (independently read by me): Sinus bradycardia 54 beats per minute.  No ectopy.  QTc interval 396 ms.  No  significant ST changes.  04/22/2013 ECG (independently read by me): Sinus bradycardia 55 beats per minute. Nonspecific ST changes  03/01/2013 ECG (independently read by me): Sinus bradycardia 52 beats per minute. Normal intervals. No significant ST changes.  Prior ECG of 11/20/2012: Sinus rhythm at 51 beats per minute. QTc interval 400 ms. No significant ST changes.  LABS:  BMP Latest Ref Rng 12/22/2013 05/12/2013 04/28/2013  Glucose 70 - 99 mg/dL 91 101(H) 101(H)  BUN 6 - 23 mg/dL _0 Creatinine 0.50 - 1.35 mg/dL 1.20 1.10 1.28  BUN/Creat Ratio 10 - 22 - - -  Sodium 135 - 145  mEq/L 140 139 141  Potassium 3.5 - 5.3 mEq/L 5.0 4.4 4.2  Chloride 96 - 112 mEq/L 104 103 107  CO2 19 - 32 mEq/L _0 Calcium 8.4 - 10.5 mg/dL 9.6 9.7 8.6   Hepatic Function Latest Ref Rng 07/29/2014 12/22/2013 04/06/2013  Total Protein 6.0 - 8.5 g/dL 7.1 7.5 6.4  Albumin 3.5 - 5.2 g/dL - 4.4 3.0(L)  AST 0 - 40 IU/L 31 33 57(H)  ALT 0 - 44 IU/L 35 36 59(H)  Alk Phosphatase 39 - 117 IU/L 60 53 46  Total Bilirubin 0.0 - 1.2 mg/dL 0.8 1.0 0.6  Bilirubin, Direct 0.00 - 0.40 mg/dL 0.17 - -   CBC Latest Ref Rng 12/22/2013 04/28/2013 04/23/2013  WBC 4.0 - 10.5 K/uL 6.1 7.1 6.2  Hemoglobin 13.0 - 17.0 g/dL 15.8 13.2 15.2  Hematocrit 39.0 - 52.0 % 46.2 39.2 46.7  Platelets 150 - 400 K/uL 261 198 267   Lab Results  Component Value Date   MCV 86.0 12/22/2013   MCV 87.3 04/28/2013   MCV 88 04/23/2013   No results found for: TSH.  Lipid Panel     Component Value Date/Time   CHOL 155 12/22/2013 0945   TRIG 130 12/22/2013 0945   TRIG 131 02/22/2013 0804   HDL 45 12/22/2013 0945   HDL 40 02/22/2013 0804   CHOLHDL 3.4 12/22/2013 0945   VLDL 26 12/22/2013 0945   LDLCALC 84 12/22/2013 0945   LDLCALC 50 02/22/2013 0804   RADIOLOGY: Dg Chest 2 View  08/17/2012   *RADIOLOGY REPORT*  Clinical Data: Renal cell carcinoma  CHEST - 2 VIEW  Comparison: 05/08/11  Findings: The cardiomediastinal silhouette is  stable.  No acute infiltrate or pleural effusion.  No pulmonary edema.  Bony thorax is unremarkable.  IMPRESSION: No active disease.  No significant change.   Original Report Authenticated By: Lahoma Crocker, M.D.      ASSESSMENT AND PLAN: Nicholas Douglas is a young appearing 81 year old gentleman who has established coronary artery disease dating back to 89 and has undergone multiple interventions in the past over a 10 year period from 20 to 1997. He developed unstable angina symptomatology in March 2015 and catheterization done initially at [facility] showed multivessel disease.  Subsequently, he has undergone successful staged rotational coronary atherectomy initially involving the RCA in early March, and then on 04/27/2013 repeat intervention was done to the left circumflex system with rotational atherectomy.  At that time, he had a widely patent stent of his RCA, as well as a widely patent stent in his LAD.  He also had 60-70% mid LAD stenosis, which had not progressed.  He has a history of hypertension.  His blood pressure today is well controlled on his current medical regimen consisting of amlodipine 10 mg atenolol 12.5 mg twice a day and valsartan 80 mg.  He is on lipid lowering therapy and is tolerating Livalo 2 mg in addition to Zetia and fish oil.  I have recommended follow-up laboratory consisting of a be met and lipid panel.  Lipid panel was supposed to be checked prior to this office visit, but the laboratory in [facility] inadvertently drew an hepatic panel rather than lipid panel.  His hepatic panel revealed normal transaminases, which had improved from March 2015.  He is not having any anginal symptoms.  He's not having any episodes of dizziness, presyncope or syncope.  I reviewed his nuclear study, which continues to show normal perfusion and argues against late restenosis or CAD  progression.  I will contact him regarding his laboratory.  I will see him in 6-8 months for follow-up evaluation or sooner  if problems arise.  Time spent: 25 minutes   [name], MD, [facility]  08/04/2014 9:03 PM

## 2014-08-19 LAB — BASIC METABOLIC PANEL
BUN/Creatinine Ratio: 16 (ref 10–22)
BUN: 19 mg/dL (ref 8–27)
CHLORIDE: 103 mmol/L (ref 97–108)
CO2: 21 mmol/L (ref 18–29)
Calcium: 9.9 mg/dL (ref 8.6–10.2)
Creatinine, Ser: 1.22 mg/dL (ref 0.76–1.27)
GFR calc Af Amer: 64 mL/min/{1.73_m2} (ref >59)
GFR calc non Af Amer: 55 mL/min/{1.73_m2} — ABNORMAL LOW (ref >59)
GLUCOSE: 107 mg/dL — AB (ref 65–99)
Potassium: 5 mmol/L (ref 3.5–5.2)
Sodium: 144 mmol/L (ref 134–144)

## 2014-08-19 LAB — LIPID PANEL
CHOL/HDL RATIO: 3.1 ratio (ref 0.0–5.0)
CHOLESTEROL TOTAL: 150 mg/dL (ref 100–199)
HDL: 49 mg/dL (ref >39)
LDL Calculated: 77 mg/dL (ref 0–99)
Triglycerides: 119 mg/dL (ref 0–149)
VLDL CHOLESTEROL CAL: 24 mg/dL (ref 5–40)

## 2014-09-05 ENCOUNTER — Other Ambulatory Visit: Payer: Self-pay | Admitting: Urology

## 2014-09-05 ENCOUNTER — Ambulatory Visit (HOSPITAL_COMMUNITY)
Admission: RE | Admit: 2014-09-05 | Discharge: 2014-09-05 | Disposition: A | Discharge status: Home / Self Care | Payer: Medicare Other | Source: Ambulatory Visit | Attending: Urology | Admitting: Urology

## 2014-09-05 DIAGNOSIS — Z87891 Personal history of nicotine dependence: Secondary | ICD-10-CM | POA: Diagnosis not present

## 2014-09-05 DIAGNOSIS — C649 Malignant neoplasm of unspecified kidney, except renal pelvis: Secondary | ICD-10-CM

## 2014-09-05 DIAGNOSIS — Z955 Presence of coronary angioplasty implant and graft: Secondary | ICD-10-CM | POA: Diagnosis not present

## 2014-09-06 ENCOUNTER — Telehealth: Payer: Self-pay | Admitting: Cardiovascular Disease

## 2014-09-06 DIAGNOSIS — I10 Essential (primary) hypertension: Secondary | ICD-10-CM

## 2014-09-06 DIAGNOSIS — E785 Hyperlipidemia, unspecified: Secondary | ICD-10-CM

## 2014-09-06 NOTE — Telephone Encounter (Signed)
Returned call to patient lab orders mailed.

## 2014-09-06 NOTE — Telephone Encounter (Signed)
Pt's wife called in requesting that the pt's lab orders be mailed to their house since he goes the [facility] to have his labs drawn . The pt's address is still current  Thanks

## 2014-10-13 ENCOUNTER — Ambulatory Visit: Payer: Medicare Other | Admitting: Cardiovascular Disease

## 2014-10-24 ENCOUNTER — Ambulatory Visit (INDEPENDENT_AMBULATORY_CARE_PROVIDER_SITE_OTHER): Payer: Medicare Other | Admitting: *Deleted

## 2014-10-24 DIAGNOSIS — Z23 Encounter for immunization: Secondary | ICD-10-CM

## 2014-11-15 ENCOUNTER — Telehealth: Payer: Self-pay | Admitting: Cardiovascular Disease

## 2014-11-15 MED ORDER — AMLODIPINE BESYLATE 10 MG PO TABS: 10.0000 mg | ORAL_TABLET | Freq: Every day | ORAL | Status: DC

## 2014-11-15 NOTE — Telephone Encounter (Signed)
°  1. Which medications need to be refilled? Amlodipine   2. Which pharmacy is medication to be sent to? [facility]   3. Do they need a 30 day or 90 day supply? 90  4. Would they like a call back once the medication has been sent to the pharmacy? Yes

## 2014-11-15 NOTE — Telephone Encounter (Signed)
Refill submitted to patient's preferred pharmacy. Informed patient. Pt voiced understanding, no other stated concerns at this time.  

## 2014-12-09 ENCOUNTER — Telehealth: Payer: Self-pay | Admitting: Cardiovascular Disease

## 2014-12-09 ENCOUNTER — Other Ambulatory Visit: Payer: Self-pay

## 2014-12-09 MED ORDER — ATENOLOL 25 MG PO TABS: 12.5000 mg | ORAL_TABLET | Freq: Two times a day (BID) | ORAL | Status: DC

## 2014-12-09 NOTE — Telephone Encounter (Signed)
°*  STAT* If patient is at the pharmacy, call can be transferred to refill team.   1. Which medications need to be refilled? (please list name of each medication and dose if known) Atenolol 25mg  tablet   2. Which pharmacy/location (including street and city if local pharmacy) is medication to be sent to?[facility]   3. Do they need a 30 day or 90 day supply? 90 day w/ 3 refills

## 2014-12-19 NOTE — Telephone Encounter (Signed)
Pt's Rx already sent to pt's requested pharmacy. Confirmation received.

## 2014-12-22 ENCOUNTER — Other Ambulatory Visit: Payer: Self-pay | Admitting: Cardiovascular Disease

## 2014-12-22 MED ORDER — EZETIMIBE 10 MG PO TABS: 10.0000 mg | ORAL_TABLET | Freq: Every day | ORAL | Status: DC

## 2014-12-22 NOTE — Telephone Encounter (Signed)
°*  STAT* If patient is at the pharmacy, call can be transferred to refill team.   1. Which medications need to be refilled? (please list name of each medication and dose if known) Zetia  2. Which pharmacy/location (including street and city if local pharmacy) is medication to be sent to?[facility]  3. Do they need a 30 day or 90 day supply? 90 and refills

## 2014-12-22 NOTE — Telephone Encounter (Signed)
Pt's Rx sent to pt's pharmacy requested. Confirmation received.

## 2014-12-26 ENCOUNTER — Encounter: Payer: Medicare Other | Admitting: Family Medicine

## 2015-01-02 ENCOUNTER — Other Ambulatory Visit: Payer: Self-pay | Admitting: Cardiovascular Disease

## 2015-01-03 LAB — LIPID PANEL
CHOL/HDL RATIO: 3.1 ratio (ref 0.0–5.0)
Cholesterol, Total: 144 mg/dL (ref 100–199)
HDL: 46 mg/dL (ref >39)
LDL Calculated: 71 mg/dL (ref 0–99)
Triglycerides: 134 mg/dL (ref 0–149)
VLDL CHOLESTEROL CAL: 27 mg/dL (ref 5–40)

## 2015-01-03 LAB — COMPREHENSIVE METABOLIC PANEL
ALT: 33 IU/L (ref 0–44)
AST: 27 IU/L (ref 0–40)
Albumin/Globulin Ratio: 1.6 (ref 1.1–2.5)
Albumin: 4.3 g/dL (ref 3.5–4.7)
Alkaline Phosphatase: 56 IU/L (ref 39–117)
BUN/Creatinine Ratio: 13 (ref 10–22)
BUN: 15 mg/dL (ref 8–27)
Bilirubin Total: 0.9 mg/dL (ref 0.0–1.2)
CALCIUM: 9.3 mg/dL (ref 8.6–10.2)
CHLORIDE: 103 mmol/L (ref 97–106)
CO2: 26 mmol/L (ref 18–29)
Creatinine, Ser: 1.19 mg/dL (ref 0.76–1.27)
GFR, EST AFRICAN AMERICAN: 66 mL/min/{1.73_m2} (ref >59)
GFR, EST NON AFRICAN AMERICAN: 57 mL/min/{1.73_m2} — AB (ref >59)
GLUCOSE: 91 mg/dL (ref 65–99)
Globulin, Total: 2.7 g/dL (ref 1.5–4.5)
Potassium: 5 mmol/L (ref 3.5–5.2)
Sodium: 142 mmol/L (ref 136–144)
TOTAL PROTEIN: 7 g/dL (ref 6.0–8.5)

## 2015-01-09 ENCOUNTER — Encounter: Payer: Self-pay | Admitting: Family Medicine

## 2015-01-09 ENCOUNTER — Ambulatory Visit (INDEPENDENT_AMBULATORY_CARE_PROVIDER_SITE_OTHER): Payer: Medicare Other | Admitting: Family Medicine

## 2015-01-09 VITALS — BP 139/79 | HR 60 | Temp 97.9°F | Resp 16 | Wt 190.8 lb

## 2015-01-09 DIAGNOSIS — I10 Essential (primary) hypertension: Secondary | ICD-10-CM

## 2015-01-09 DIAGNOSIS — D0362 Melanoma in situ of left upper limb, including shoulder: Secondary | ICD-10-CM

## 2015-01-09 DIAGNOSIS — I2583 Coronary atherosclerosis due to lipid rich plaque: Secondary | ICD-10-CM

## 2015-01-09 DIAGNOSIS — I2510 Atherosclerotic heart disease of native coronary artery without angina pectoris: Secondary | ICD-10-CM

## 2015-01-09 DIAGNOSIS — C642 Malignant neoplasm of left kidney, except renal pelvis: Secondary | ICD-10-CM | POA: Diagnosis not present

## 2015-01-09 DIAGNOSIS — K635 Polyp of colon: Secondary | ICD-10-CM | POA: Diagnosis not present

## 2015-01-09 DIAGNOSIS — Z0000 Encounter for general adult medical examination without abnormal findings: Secondary | ICD-10-CM | POA: Diagnosis not present

## 2015-01-09 NOTE — Patient Instructions (Signed)

## 2015-01-09 NOTE — Progress Notes (Signed)
Subjective:    Patient ID: Nicholas Douglas, male    DOB: [birth year], 81 y.o.   MRN: [id number]  01/09/2015  Annual Exam   HPI This 81 y.o. male presents for Annual Wellness Examination and Routine Physical Examination.  Last physical:  12-22-2013 Colonoscopy: 2010; +polyps; thinks has had repeat?  Repeat in 3 years.   TDAP: 2013 Pneumovax:  2008, 12/2013 Prevnar 13 Zostavax:  2012 Influenza:  10/2014 Eye exam:  11/2014 Gloriann Loan.  Dental exam:  Scheduled this month.   CAD: follow-up this month with [name].  L RCC:  Follow-up 09/2014; released yet wife wants to follow up one more time.  HTN:  Patient reports good compliance with medication, good tolerance to medication, and good symptom control.    Hyperlipidemia: Patient reports good compliance with medication, good tolerance to medication, and good symptom control.    BPH:  Follow up in 09/2014; released; did not recommend further checks. Weak urinary stream.     Melanoma: scheduled this month.  R ankle pain: twisted ankle nine weeks ago.  Lateral ankle pain.  Worried about ankle fracture.  Was able to bear weight immediately.  Swelling initially; iced and heated ankle.  Wearing ankle brace.  Still painful with walking up and down hills.     Review of Systems  Constitutional: Negative for fever, chills, diaphoresis, activity change, appetite change, fatigue and unexpected weight change.  HENT: Negative for congestion, dental problem, drooling, ear discharge, ear pain, facial swelling, hearing loss, mouth sores, nosebleeds, postnasal drip, rhinorrhea, sinus pressure, sneezing, sore throat, tinnitus, trouble swallowing and voice change.   Eyes: Negative for photophobia, pain, discharge, redness, itching and visual disturbance.  Respiratory: Negative for apnea, cough, choking, chest tightness, shortness of breath, wheezing and stridor.   Cardiovascular: Negative for chest pain, palpitations and leg swelling.  Gastrointestinal: Negative  for nausea, vomiting, abdominal pain, diarrhea, constipation and blood in stool.  Endocrine: Negative for cold intolerance, heat intolerance, polydipsia, polyphagia and polyuria.  Genitourinary: Negative for dysuria, urgency, frequency, hematuria, flank pain, decreased urine volume, discharge, penile swelling, scrotal swelling, enuresis, difficulty urinating, genital sores, penile pain and testicular pain.       +weak urinary stream; no straining; does sit down to urinate.   Musculoskeletal: Positive for arthralgias. Negative for myalgias, back pain, joint swelling, gait problem, neck pain and neck stiffness.  Skin: Negative for color change, pallor, rash and wound.  Allergic/Immunologic: Negative for environmental allergies, food allergies and immunocompromised state.  Neurological: Negative for dizziness, tremors, seizures, syncope, facial asymmetry, speech difficulty, weakness, light-headedness, numbness and headaches.  Hematological: Negative for adenopathy. Does not bruise/bleed easily.  Psychiatric/Behavioral: Negative for suicidal ideas, hallucinations, behavioral problems, confusion, sleep disturbance, self-injury, dysphoric mood, decreased concentration and agitation. The patient is not nervous/anxious and is not hyperactive.     Past Medical History  Diagnosis Date  . Hypertension   . Personal history of colonic polyps   . Other abnormal blood chemistry   . Other nonspecific abnormal serum enzyme levels   . Coronary atherosclerosis of unspecified type of vessel, native or graft   . Impotence of organic origin   . Tobacco use disorder   . Pure hypercholesterolemia   . Internal hemorrhoids without mention of complication   . Melanoma ([place]) 06/2010    Left arm  . Myocardial infarction ([facility]) 1976  . Unspecified congenital cystic kidney disease   . Malignant neoplasm of kidney, except pelvis   . Calculus of kidney     "that's  how they found the cancer" (04/27/2013)  . BPH (benign  prostatic hyperplasia)     Followed by [name]   Past Surgical History  Procedure Laterality Date  . Nephrectomy Left 2006  . Melanoma excision Left ~ 2007    "arm"  . Colonoscopy  10/05/2008    single polyp, IH.  [name].  Repeat in 3 years.  . Carotid dopplers  03/08/2011    minimal plaque formation B. Symptoms: dizziness.  . Cardiolite  05/06/2011    low risk study; normal EF.  SE H&V.  Marland Kitchen Coronary angioplasty      "I've had 4" (04/27/2013)  . Coronary angioplasty with stent placement  04/2013; 04/27/2013    "2 + 1" (04/27/2013)  . Appendectomy  2014  . Eye surgery  11/04/2013    Cataract surgery B; [facility].  Marland Kitchen Percutaneous coronary rotoblator intervention (pci-r) N/A 04/06/2013    Procedure: PERCUTANEOUS CORONARY ROTOBLATOR INTERVENTION (PCI-R);  Surgeon: [name], MD;  Location: [facility] CATH LAB;  Service: Cardiovascular;  Laterality: N/A;  . Percutaneous coronary stent intervention (pci-s) N/A 04/27/2013    Procedure: PERCUTANEOUS CORONARY STENT INTERVENTION (PCI-S);  Surgeon: [name], MD;  Location: [facility] CATH LAB;  Service: Cardiovascular;  Laterality: N/A;   Allergies  Allergen Reactions  . Lovastatin Other (See Comments)    Elevated liver enzymes  . Morphine Hives  . Morphine And Related Hives  . Nifedipine Other (See Comments)    Elevated liver enzymes   Current Outpatient Prescriptions  Medication Sig Dispense Refill  . amLODipine (NORVASC) 10 MG tablet Take 1 tablet (10 mg total) by mouth daily. 90 tablet 2  . aspirin 81 MG tablet Take 81 mg by mouth daily.    Marland Kitchen atenolol (TENORMIN) 25 MG tablet Take 0.5 tablets (12.5 mg total) by mouth 2 (two) times daily. 90 tablet 2  . clopidogrel (PLAVIX) 75 MG tablet Take 1 tablet (75 mg total) by mouth daily. 90 tablet 3  . ezetimibe (ZETIA) 10 MG tablet Take 1 tablet (10 mg total) by mouth daily. 90 tablet 2  . finasteride (PROSCAR) 5 MG tablet Take 1 tablet (5 mg total) by mouth daily. 90 tablet 3  . nitroGLYCERIN  (NITROSTAT) 0.4 MG SL tablet Place 1 tablet (0.4 mg total) under the tongue every 5 (five) minutes as needed for chest pain. 25 tablet 2  . valsartan (DIOVAN) 80 MG tablet Take 1 tablet (80 mg total) by mouth daily. 90 tablet 3  . Icosapent Ethyl 1 G CAPS Take 1 g by mouth 2 (two) times daily. 180 capsule 3  . Pitavastatin Calcium (LIVALO) 2 MG TABS Take 1 tablet (2 mg total) by mouth once. 90 tablet 3   No current facility-administered medications for this visit.   Social History   Social History  . Marital Status: Married    Spouse Name: N/A  . Number of Children: 3  . Years of Education: college   Occupational History  . minister     Personal assistant   Social History Main Topics  . Smoking status: Former Smoker    Types: Pipe, Landscape architect  . Smokeless tobacco: Current User    Types: Chew     Comment: 04/27/2013 "quit smoking & chewing last year"  . Alcohol Use: 3.0 oz/week    5 Glasses of wine per week  . Drug Use: No  . Sexual Activity: Yes   Other Topics Concern  . Not on file   Social History Narrative   Marital status:  Married x 30 years; 2nd marriage      Children:  3 children, 2 step-children and 10 grandchildren.        Lives:  Lives with wife.         Employment:  Retired; Environmental education officer      Tobacco:  Former user; chews tobacco.      Alcohol:  5 glasses of wine per week       Drugs:  No drugs.        Exercise: 5 x week Light; plays golf 5 days per week but rides golf cart, walking 1 mile.         Advanced Directives:  Patient DOES NOT have living will; desires DNR/DNI.       ADLS: independent with all ADLs; drives.  No falls.  Does not use assistant devices with ambulation.     Family History  Problem Relation Age of Onset  . Heart disease Mother     CAD, AAA.  Marland Kitchen AAA (abdominal aortic aneurysm) Mother        Objective:    BP 150/81 mmHg  Pulse 60  Temp(Src) 97.9 F (36.6 C) (Oral)  Resp 16  Wt 190 lb 12.8 oz (86.546 kg)  SpO2 97% Physical Exam    Constitutional: He is oriented to person, place, and time. He appears well-developed and well-nourished. No distress.  HENT:  Head: Normocephalic and atraumatic.  Right Ear: External ear normal.  Left Ear: External ear normal.  Nose: Nose normal.  Mouth/Throat: Oropharynx is clear and moist.  Eyes: Conjunctivae and EOM are normal. Pupils are equal, round, and reactive to light.  Neck: Normal range of motion. Neck supple. Carotid bruit is not present. No thyromegaly present.  Cardiovascular: Normal rate, regular rhythm, normal heart sounds and intact distal pulses.  Exam reveals no gallop and no friction rub.   No murmur heard. Pulmonary/Chest: Effort normal and breath sounds normal. He has no wheezes. He has no rales.  Abdominal: Soft. Bowel sounds are normal. He exhibits no distension and no mass. There is no tenderness. There is no rebound and no guarding.  Musculoskeletal:       Right shoulder: Normal.       Left shoulder: Normal.       Right ankle: He exhibits normal range of motion and no swelling. No tenderness. No lateral malleolus and no medial malleolus tenderness found.       Cervical back: Normal.  Lymphadenopathy:    He has no cervical adenopathy.  Neurological: He is alert and oriented to person, place, and time. He has normal reflexes. No cranial nerve deficit. He exhibits normal muscle tone. Coordination normal.  Skin: Skin is warm and dry. No rash noted. He is not diaphoretic.  Psychiatric: He has a normal mood and affect. His behavior is normal. Judgment and thought content normal.   Results for orders placed or performed in visit on 01/02/15  Comprehensive metabolic panel  Result Value Ref Range   Glucose 91 65 - 99 mg/dL   BUN 15 8 - 27 mg/dL   Creatinine, Ser 1.19 0.76 - 1.27 mg/dL   GFR calc non Af Amer 57 (L) >59 mL/min/1.73   GFR calc Af Amer 66 >59 mL/min/1.73   BUN/Creatinine Ratio 13 10 - 22   Sodium 142 136 - 144 mmol/L   Potassium 5.0 3.5 - 5.2 mmol/L    Chloride 103 97 - 106 mmol/L   CO2 26 18 - 29 mmol/L   Calcium 9.3 8.6 -  10.2 mg/dL   Total Protein 7.0 6.0 - 8.5 g/dL   Albumin 4.3 3.5 - 4.7 g/dL   Globulin, Total 2.7 1.5 - 4.5 g/dL   Albumin/Globulin Ratio 1.6 1.1 - 2.5   Bilirubin Total 0.9 0.0 - 1.2 mg/dL   Alkaline Phosphatase 56 39 - 117 IU/L   AST 27 0 - 40 IU/L   ALT 33 0 - 44 IU/L  Lipid panel  Result Value Ref Range   Cholesterol, Total 144 100 - 199 mg/dL   Triglycerides 134 0 - 149 mg/dL   HDL 46 >39 mg/dL   VLDL Cholesterol Cal 27 5 - 40 mg/dL   LDL Calculated 71 0 - 99 mg/dL   Chol/HDL Ratio 3.1 0.0 - 5.0 ratio units       Assessment & Plan:   1. Encounter for Medicare annual wellness exam   2. Routine physical examination   3. Melanoma in situ of upper arm, left ([facility])   4. Colon polyps   5. Essential hypertension, benign   6. Coronary artery disease due to lipid rich plaque   7. Renal cell carcinoma, left ([facility])     No orders of the defined types were placed in this encounter.   No orders of the defined types were placed in this encounter.    Return in about 1 year (around 01/09/2016) for complete physical examiniation.    Irmgard Rampersaud Elayne Guerin, M.D. [facility] [address] [place], [place]  [address] [phone] phone [phone] fax

## 2015-01-12 ENCOUNTER — Ambulatory Visit (INDEPENDENT_AMBULATORY_CARE_PROVIDER_SITE_OTHER): Payer: Medicare Other | Admitting: Cardiovascular Disease

## 2015-01-12 ENCOUNTER — Encounter: Payer: Self-pay | Admitting: Cardiovascular Disease

## 2015-01-12 VITALS — BP 132/74 | HR 54 | Ht 67.5 in | Wt 192.1 lb

## 2015-01-12 DIAGNOSIS — E785 Hyperlipidemia, unspecified: Secondary | ICD-10-CM | POA: Diagnosis not present

## 2015-01-12 DIAGNOSIS — I10 Essential (primary) hypertension: Secondary | ICD-10-CM | POA: Diagnosis not present

## 2015-01-12 DIAGNOSIS — I714 Abdominal aortic aneurysm, without rupture, unspecified: Secondary | ICD-10-CM | POA: Insufficient documentation

## 2015-01-12 DIAGNOSIS — I25810 Atherosclerosis of coronary artery bypass graft(s) without angina pectoris: Secondary | ICD-10-CM | POA: Diagnosis not present

## 2015-01-12 NOTE — Patient Instructions (Signed)
Your physician has requested that you have an abdominal aorta duplex. During this test, an ultrasound is used to evaluate the aorta. Allow 30 minutes for this exam. Do not eat after midnight the day before and avoid carbonated beverages THIS WILL BE DONE IN June 2017.  Your physician wants you to follow-up in: 6 MONTHS OR SOONER IF NEEDED. You will receive a reminder letter in the mail two months in advance. If you don't receive a letter, please call our office to schedule the follow-up appointment.  If you need a refill on your cardiac medications before your next appointment, please call your pharmacy.

## 2015-01-12 NOTE — Progress Notes (Signed)
Patient ID: Nicholas Douglas, male   DOB: [birth year], 81 y.o.   MRN: [id number]     HPI: Nicholas Douglas is an 81 y.o. Caucasian male who presents to the office today for a six-month follow-up evaluation.  Mr. Holsworth has known CAD and underwent numerous interventions dating back to 65, 1991, 1993, and in 1997. Cardiac catheterization in 2011 which showed preserved LV function with mild residual distal inferior apical hypocontractility. There is evidence for coronary calcification with segmental narrowing of his LAD of 30-40% proximally, 50% diffusely, in the midsegment, and 70-80% in the distal region, he had AV groove circumflex stenoses of 70 and 50% with a 40% OM 2 stenosis, and a 30-40 and 50% RCA stenoses with 80-90% stenosis in the acute marginal branch. He had been on medical therapy.  He  presented to [facility] in March 2015 with class IV angina and catheterization demonstrated severe 2 vessel CAD with significant current calcification of the LAD with up to 95% mid LAD stenosis and diffuse proximal stenosis. He underwent successful high-speed rotational atherectomy the following day by Dr. Martinique and a 1.5 mm bur was used. Mid LAD was stented with a 2.75x38 mm Promus stent in the proximal LAD was stented with a 3.0x28 mm Promus stent. Medical therapy was recommended for his distal LAD stenosis. He has only one kidney and staged intervention to the left circumflex coronary artery was recommended.  He underwent staged left circumflex PCI on 04/27/2013 by me with successful high-speed rotational atherectomy with a 1.5 and 1.75 mm burr, and ultimately had a 2.5x28 mm Promus premier DES stent inserted into the circumflex vessel, which was post dilated to approximately 2.6 mm.  Subsequently he has felt significantly improved with resolution of any chest pain  He  has a history of significant hyperlipidemia and has had significant increased number of small LDL particles and insulin resistance. This seemed  to markedly improve with the addition of Niaspan added to zetia and lovaza.  Remotely, he had been on statins consisting of Lipitor and subsequently Crestor. He did have transient LFT elevation. He also concerns of possible risk of developing dementia with statin therapy.  He had  some episodes of Niaspan induced diffuse flushing. He wanted to stop taking his Niaspan. He did have subsequent NMR off Niaspan and done just on Zetia and as well as 2 g of Lovaza.  This showed increased abnormalities such that his total cholesterol was 201, LDL had risen to 124, but he now had LDL small particles which have increased from 685 to 1348. Laboratory on 10/26/2012: LDL particle number was elevated at 1657, LDL cholesterol 112 triglycerides 155 and total cholesterol 190. He continued to be insulin resistant with insulin resistance scored 65. When I last saw him, we elected to try Livalo and ultimately titrated this to 4 mg daily to take in addition to his Zetia 10 mg. Laboratory on 02/22/2013: LDL particle number was  markedly improved at 774 with an HDL of 40 calculated LDL 50 small LDL particle #417. Insulin resistance score was also improved at 51.  In July 2015 he developed abdominal discomfort and was found to have acute appendicitis. On CT imaging he was also noted to have prostate enlargement with nodularity and thickening of the bladder base and cystoscopy was suggested for further evaluation. He is status post left nephrectomy. He also was noted to have a 2.3 cm infrarenal suprailiac abdominal aortic aneurysm.  Since I last saw him he has been  without anginal symptoms.  He notes some mild shortness of breath with activity.  He has been on Livalo 2 mg, Zetia 10 mg for hyperlipidemia.  Lab work on 12/22/2013 showed a cholesterol 155, triglycerides 130, HDL 45, LDL 84.  He has been on amlodipine 10 mg atenolol 12.5 mg twice a day and valsartan 80 mg daily for blood pressure.  He continues to be on aspirin and Plavix  for dual antiplatelet therapy.  A nuclear perfusion study on 06/30/2014  revealed normal perfusion and function with an ejection fraction of 57%.    He tells me that when he will underwent a CT scan for his appendicitis/year he was told of having a small abdominal aortic aneurysm.  His mother also had an abdominal aortic aneurysm.  The patient recently sprained his right ankle.  As result, he has not been able to be as active as he had in the past and has not been able to play golf which typically he had played up to 4 times per week, often scoring in the 70s.  He presents for follow-up evaluation     Past Medical History  Diagnosis Date  . Hypertension   . Personal history of colonic polyps   . Other abnormal blood chemistry   . Other nonspecific abnormal serum enzyme levels   . Coronary atherosclerosis of unspecified type of vessel, native or graft   . Impotence of organic origin   . Tobacco use disorder   . Pure hypercholesterolemia   . Internal hemorrhoids without mention of complication   . Melanoma ([place]) 06/2010    Left arm  . Myocardial infarction ([place]) 1976  . Unspecified congenital cystic kidney disease   . Malignant neoplasm of kidney, except pelvis   . Calculus of kidney     "that's how they found the cancer" (04/27/2013)  . BPH (benign prostatic hyperplasia)     Followed by [name]/urology    Past Surgical History  Procedure Laterality Date  . Nephrectomy Left 2006  . Melanoma excision Left ~ 2007    "arm"  . Colonoscopy  10/05/2008    single polyp, IH.  [name].  Repeat in 3 years.  . Carotid dopplers  03/08/2011    minimal plaque formation B. Symptoms: dizziness.  . Cardiolite  05/06/2011    low risk study; normal EF.  SE H&V.  Marland Kitchen Coronary angioplasty      "I've had 4" (04/27/2013)  . Coronary angioplasty with stent placement  04/2013; 04/27/2013    "2 + 1" (04/27/2013)  . Appendectomy  2014  . Eye surgery  11/04/2013    Cataract surgery B; [facility].  Marland Kitchen Percutaneous  coronary rotoblator intervention (pci-r) N/A 04/06/2013    Procedure: PERCUTANEOUS CORONARY ROTOBLATOR INTERVENTION (PCI-R);  Surgeon: [name], MD;  Location: [facility] CATH LAB;  Service: Cardiovascular;  Laterality: N/A;  . Percutaneous coronary stent intervention (pci-s) N/A 04/27/2013    Procedure: PERCUTANEOUS CORONARY STENT INTERVENTION (PCI-S);  Surgeon: [name], MD;  Location: [facility] CATH LAB;  Service: Cardiovascular;  Laterality: N/A;    Allergies  Allergen Reactions  . Lovastatin Other (See Comments)    Elevated liver enzymes  . Morphine Hives  . Morphine And Related Hives  . Nifedipine Other (See Comments)    Elevated liver enzymes    Current Outpatient Prescriptions  Medication Sig Dispense Refill  . amLODipine (NORVASC) 10 MG tablet Take 1 tablet (10 mg total) by mouth daily. 90 tablet 2  . aspirin 81 MG tablet Take  81 mg by mouth daily.    Marland Kitchen atenolol (TENORMIN) 25 MG tablet Take 0.5 tablets (12.5 mg total) by mouth 2 (two) times daily. 90 tablet 2  . clopidogrel (PLAVIX) 75 MG tablet Take 1 tablet (75 mg total) by mouth daily. 90 tablet 3  . ezetimibe (ZETIA) 10 MG tablet Take 1 tablet (10 mg total) by mouth daily. 90 tablet 2  . finasteride (PROSCAR) 5 MG tablet Take 1 tablet (5 mg total) by mouth daily. 90 tablet 3  . Icosapent Ethyl 1 G CAPS Take 1 g by mouth 2 (two) times daily. 180 capsule 3  . latanoprost (XALATAN) 0.005 % ophthalmic solution Place 1 drop into both eyes at bedtime.  4  . nitroGLYCERIN (NITROSTAT) 0.4 MG SL tablet Place 1 tablet (0.4 mg total) under the tongue every 5 (five) minutes as needed for chest pain. 25 tablet 2  . Pitavastatin Calcium (LIVALO) 2 MG TABS Take 1 tablet (2 mg total) by mouth once. 90 tablet 3  . valsartan (DIOVAN) 80 MG tablet Take 1 tablet (80 mg total) by mouth daily. 90 tablet 3   No current facility-administered medications for this visit.    Socially he remains active. He is married has 5 children 10 grandchildren 2  great-grandchildren. Is no alcohol use. He typically scores below 75 and plays golf 4 days per week.  He has turned 82 any typically scores in the 70s, better than his age.  ROS General: Negative; No fevers, chills, or night sweats;  HEENT: Negative; No changes in vision or hearing, sinus congestion, difficulty swallowing Pulmonary: Negative; No cough, wheezing, shortness of breath, hemoptysis Cardiovascular: See history of present illness; No chest pain, presyncope, syncope, palpatations GI: Negative; No nausea, vomiting, diarrhea, or abdominal pain GU: Negative; No dysuria, hematuria, or difficulty voiding Musculoskeletal: Negative; no myalgias, joint pain, or weakness Hematologic/Oncology: Negative; no easy bruising, bleeding Endocrine: Negative; no heat/cold intolerance; no diabetes Neuro: Negative; no changes in balance, headaches Skin: Negative; No rashes or skin lesions Psychiatric: Negative; No behavioral problems, depression Sleep: Negative; No snoring, daytime sleepiness, hypersomnolence, bruxism, restless legs, hypnogognic hallucinations, no cataplexy Other comprehensive 14 point system review is negative.   PE BP 132/74 mmHg  Pulse 54  Ht 5' 7.5" (1.715 m)  Wt 192 lb 1.6 oz (87.136 kg)  BMI 29.63 kg/m2  Repeat blood pressure by me 112/72  Wt Readings from Last 3 Encounters:  01/12/15 192 lb 1.6 oz (87.136 kg)  01/09/15 190 lb 12.8 oz (86.546 kg)  08/04/14 191 lb 6.4 oz (86.818 kg)   General: Alert, oriented, no distress.  Skin: normal turgor, no rashes HEENT: Normocephalic, atraumatic. Pupils round and reactive; sclera anicteric;no lid lag.  Nose without nasal septal hypertrophy Mouth/Parynx benign; Mallinpatti scale 2 Neck: No JVD, no carotid bruits; normal carotid up stroke Lungs: clear to ausculatation and percussion; no wheezing or rales Chest wall: Nontender to palpation the Heart: RRR, s1 s2 normal 1/6 sem; no diastolic murmur. No rubs thrills or  heaves. Abdomen: soft, nontender; no hepatosplenomehaly, BS+; abdominal aorta nontender and not significantly dilated by palpation. Back: No CVA tenderness. Pulses 2+ Extremities: Right ankle in an ankle brace; no clubbing cyanosis or edema, Homan's sign negative  Neurologic: grossly nonfocal Psychological: Normal affect and mood  ECG (independently read by me): Sinus bradycardia 54 bpm.  No ectopy.  Normal intervals.  June 2016 ECG (independently read by me): Sinus bradycardia 58 bpm.  Normal intervals.  No ectopy. Non-specific T change aVL  November  2015 ECG (independently read by me): Sinus bradycardia 54 bpm.  No significant ST-T changes.  Normal intervals.  May 2015 ECG (independently read by me): Sinus bradycardia 54 beats per minute.  No ectopy.  QTc interval 396 ms.  No significant ST changes.  04/22/2013 ECG (independently read by me): Sinus bradycardia 55 beats per minute. Nonspecific ST changes  03/01/2013 ECG (independently read by me): Sinus bradycardia 52 beats per minute. Normal intervals. No significant ST changes.  Prior ECG of 11/20/2012: Sinus rhythm at 51 beats per minute. QTc interval 400 ms. No significant ST changes.  LABS:  BMP Latest Ref Rng 01/02/2015 08/18/2014 12/22/2013  Glucose 65 - 99 mg/dL 91 107(H) 91  BUN 8 - 27 mg/dL _0 Creatinine 0.76 - 1.27 mg/dL 1.19 1.22 1.20  BUN/Creat Ratio 10 - _1 -  Sodium 136 - 144 mmol/L 142 144 140  Potassium 3.5 - 5.2 mmol/L 5.0 5.0 5.0  Chloride 97 - 106 mmol/L 103 103 104  CO2 18 - 29 mmol/L _2 Calcium 8.6 - 10.2 mg/dL 9.3 9.9 9.6   Hepatic Function Latest Ref Rng 01/02/2015 07/29/2014 12/22/2013  Total Protein 6.0 - 8.5 g/dL 7.0 7.1 7.5  Albumin 3.5 - 4.7 g/dL 4.3 4.5 4.4  AST 0 - 40 IU/L 27 31 33  ALT 0 - 44 IU/L 33 35 36  Alk Phosphatase 39 - 117 IU/L 56 60 53  Total Bilirubin 0.0 - 1.2 mg/dL 0.9 0.8 1.0  Bilirubin, Direct 0.00 - 0.40 mg/dL - 0.17 -   CBC Latest Ref Rng 12/22/2013  04/28/2013 04/23/2013  WBC 4.0 - 10.5 K/uL 6.1 7.1 6.2  Hemoglobin 13.0 - 17.0 g/dL 15.8 13.2 15.2  Hematocrit 39.0 - 52.0 % 46.2 39.2 46.7  Platelets 150 - 400 K/uL 261 198 267   Lab Results  Component Value Date   MCV 86.0 12/22/2013   MCV 87.3 04/28/2013   MCV 88 04/23/2013   No results found for: TSH.  Lipid Panel     Component Value Date/Time   CHOL 144 01/02/2015 0805   CHOL 155 12/22/2013 0945   TRIG 134 01/02/2015 0805   TRIG 131 02/22/2013 0804   HDL 46 01/02/2015 0805   HDL 45 12/22/2013 0945   HDL 40 02/22/2013 0804   CHOLHDL 3.1 01/02/2015 0805   CHOLHDL 3.4 12/22/2013 0945   VLDL 26 12/22/2013 0945   LDLCALC 71 01/02/2015 0805   LDLCALC 84 12/22/2013 0945   LDLCALC 50 02/22/2013 0804   RADIOLOGY: Dg Chest 2 View  08/17/2012   *RADIOLOGY REPORT*  Clinical Data: Renal cell carcinoma  CHEST - 2 VIEW  Comparison: 05/08/11  Findings: The cardiomediastinal silhouette is stable.  No acute infiltrate or pleural effusion.  No pulmonary edema.  Bony thorax is unremarkable.  IMPRESSION: No active disease.  No significant change.   Original Report Authenticated By: Lahoma Crocker, M.D.      ASSESSMENT AND PLAN: Mr. Prospero Mahnke is a young appearing 81 year old gentleman who has CAD dating back to 72 and has undergone multiple interventions in the past over a 10 year period from 71 to 1997. He developed unstable angina symptomatology in March 2015 and catheterization done initially at [facility] showed multivessel disease.  Subsequently, he has undergone successful staged rotational coronary atherectomy initially involving the RCA in early March, and then on 04/27/2013 repeat intervention was done to the left circumflex system with rotational atherectomy.  At that time, he had a widely patent  stent of his RCA, as well as a widely patent stent in his LAD.  He also had 60-70% mid LAD stenosis, which had not progressed.  He has not had any recent anginal symptoms.  He has a history of  hypertension.  His blood pressure today is well controlled on his current medical regimen consisting of amlodipine 10 mg atenolol 12.5 mg twice a day and valsartan 80 mg.  He is on lipid lowering therapy and is tolerating Livalo 2 mg in addition to Zetia and fish oil.  His recent laboratory demonstrates continued excellent benefit with a total cholesterol 144, triglycerides 134, HDL 46, and LDL 71.  He was found to have a small abdominal aortic aneurysm.  I plan to see him in 6 months.  Prior to that office visit he will undergo a 2 year follow-up evaluation of this abdominal aortic aneurysm with abdominal aortic ultrasound, and I will see him back in the office for follow-up evaluation.  Time spent: 25 minutes  [name], MD, [facility]  01/12/2015 5:09 PM

## 2015-01-17 ENCOUNTER — Encounter: Payer: Self-pay | Admitting: Family Medicine

## 2015-02-02 ENCOUNTER — Encounter: Payer: Self-pay | Admitting: Family Medicine

## 2015-02-07 ENCOUNTER — Telehealth: Payer: Self-pay | Admitting: Cardiovascular Disease

## 2015-02-07 DIAGNOSIS — C4359 Malignant melanoma of other part of trunk: Secondary | ICD-10-CM

## 2015-02-07 HISTORY — PX: MELANOMA EXCISION: SHX5266

## 2015-02-07 HISTORY — DX: Malignant melanoma of other part of trunk: C43.59

## 2015-02-07 NOTE — Telephone Encounter (Signed)
Will forward to dr [name] for advise.

## 2015-02-07 NOTE — Telephone Encounter (Signed)
Pt having a cancer on his back on 02-15-15. They wants him to stop his Plavix 7 days before. Is this all right?

## 2015-02-09 NOTE — Telephone Encounter (Signed)
Request for surgical clearance:  1. What type of surgery is being performed? Melanoma removal   2. When is this surgery scheduled? 1/11  3. Are there any medications that need to be held prior to surgery and how long?Plavix( he stopped it himself on 1/4)  4. Name of physician performing surgery? Dr. Cena Benton  5. What is your office phone and fax number? [phone] 6.

## 2015-02-09 NOTE — Telephone Encounter (Signed)
Called [name]. He went off Plavix yesterday for a melanoma removal. He states Dr. Cena Benton still needs a written clearance from Dr. [name] to go ahead w/ proc.  Fwd to Dr. [name].

## 2015-02-09 NOTE — Telephone Encounter (Signed)
Pt is scheduled for surgery next Wednesday,they are still waiting to hear from Dr [name]. Pt stopped his Plavix on Wednesday(02-08-15).

## 2015-02-13 NOTE — Telephone Encounter (Signed)
OK to be off plavix for at least 5 days prior to procedure. Clearance given for procedure.

## 2015-02-14 ENCOUNTER — Encounter: Payer: Self-pay | Admitting: *Deleted

## 2015-02-15 NOTE — Telephone Encounter (Signed)
Note done. Contacted patient to get fax number to send note to. They did not have a number. Could not find a fax number on the website. Will hold until Dr Vaughan Basta office call again. They have been given verbal okay for surgery previously.

## 2015-03-30 ENCOUNTER — Telehealth: Payer: Self-pay | Admitting: Family Medicine

## 2015-03-30 ENCOUNTER — Other Ambulatory Visit: Payer: Self-pay | Admitting: Family Medicine

## 2015-03-30 MED ORDER — OSELTAMIVIR PHOSPHATE 75 MG PO CAPS: 75.0000 mg | ORAL_CAPSULE | Freq: Every day | ORAL | Status: DC

## 2015-03-30 NOTE — Telephone Encounter (Signed)
Wife diagnosed with influenza today; Tamiflu sent into pharmacy.

## 2015-04-05 ENCOUNTER — Other Ambulatory Visit: Payer: Self-pay | Admitting: Cardiovascular Disease

## 2015-04-05 MED ORDER — ICOSAPENT ETHYL 1 G PO CAPS: 1.0000 g | ORAL_CAPSULE | Freq: Two times a day (BID) | ORAL | Status: DC

## 2015-04-05 MED ORDER — CLOPIDOGREL BISULFATE 75 MG PO TABS: 75.0000 mg | ORAL_TABLET | Freq: Every day | ORAL | Status: DC

## 2015-04-05 NOTE — Telephone Encounter (Signed)
Rx(s) sent to pharmacy electronically.  

## 2015-04-05 NOTE — Telephone Encounter (Signed)
°*  STAT* If patient is at the pharmacy, call can be transferred to refill team.   1. Which medications need to be refilled? (please list name of each medication and dose if known) Vascepa and Clopidogrel 75 mg-new prescriptions 2. Which pharmacy/location (including street and city if local pharmacy) is medication to be sent to?[facility] 3. Do they need a 30 day or 90 day supply? 90 and refills

## 2015-04-20 ENCOUNTER — Ambulatory Visit (INDEPENDENT_AMBULATORY_CARE_PROVIDER_SITE_OTHER): Payer: Medicare Other

## 2015-04-20 ENCOUNTER — Ambulatory Visit (INDEPENDENT_AMBULATORY_CARE_PROVIDER_SITE_OTHER): Payer: Medicare Other | Admitting: Family Medicine

## 2015-04-20 VITALS — BP 118/84 | HR 61 | Temp 97.9°F | Resp 18 | Ht 67.0 in | Wt 196.0 lb

## 2015-04-20 DIAGNOSIS — M542 Cervicalgia: Secondary | ICD-10-CM

## 2015-04-20 DIAGNOSIS — R05 Cough: Secondary | ICD-10-CM | POA: Diagnosis not present

## 2015-04-20 DIAGNOSIS — G9331 Postviral fatigue syndrome: Secondary | ICD-10-CM

## 2015-04-20 DIAGNOSIS — R059 Cough, unspecified: Secondary | ICD-10-CM

## 2015-04-20 DIAGNOSIS — J111 Influenza due to unidentified influenza virus with other respiratory manifestations: Secondary | ICD-10-CM

## 2015-04-20 DIAGNOSIS — D0359 Melanoma in situ of other part of trunk: Secondary | ICD-10-CM

## 2015-04-20 DIAGNOSIS — Z85528 Personal history of other malignant neoplasm of kidney: Secondary | ICD-10-CM

## 2015-04-20 DIAGNOSIS — G933 Postviral fatigue syndrome: Secondary | ICD-10-CM | POA: Diagnosis not present

## 2015-04-20 LAB — COMPREHENSIVE METABOLIC PANEL
ALBUMIN: 4.8 g/dL (ref 3.6–5.1)
ALT: 35 U/L (ref 9–46)
AST: 33 U/L (ref 10–35)
Alkaline Phosphatase: 59 U/L (ref 40–115)
BUN: 18 mg/dL (ref 7–25)
CALCIUM: 10.4 mg/dL — AB (ref 8.6–10.3)
CHLORIDE: 103 mmol/L (ref 98–110)
CO2: 29 mmol/L (ref 20–31)
Creat: 1.46 mg/dL — ABNORMAL HIGH (ref 0.70–1.11)
Glucose, Bld: 88 mg/dL (ref 65–99)
Potassium: 5.8 mmol/L — ABNORMAL HIGH (ref 3.5–5.3)
SODIUM: 141 mmol/L (ref 135–146)
Total Bilirubin: 0.9 mg/dL (ref 0.2–1.2)
Total Protein: 7.8 g/dL (ref 6.1–8.1)

## 2015-04-20 LAB — POCT CBC
GRANULOCYTE PERCENT: 64.3 % (ref 37–80)
HEMATOCRIT: 46.3 % (ref 43.5–53.7)
Hemoglobin: 16.5 g/dL (ref 14.1–18.1)
Lymph, poc: 2 (ref 0.6–3.4)
MCH, POC: 30.4 pg (ref 27–31.2)
MCHC: 35.6 g/dL — AB (ref 31.8–35.4)
MCV: 85.3 fL (ref 80–97)
MID (CBC): 1 — AB (ref 0–0.9)
MPV: 6.9 fL (ref 0–99.8)
POC GRANULOCYTE: 5.5 (ref 2–6.9)
POC LYMPH %: 23.5 % (ref 10–50)
POC MID %: 12.2 % — AB (ref 0–12)
Platelet Count, POC: 253 10*3/uL (ref 142–424)
RBC: 5.43 M/uL (ref 4.69–6.13)
RDW, POC: 12.8 %
WBC: 8.5 10*3/uL (ref 4.6–10.2)

## 2015-04-20 MED ORDER — AMOXICILLIN-POT CLAVULANATE 875-125 MG PO TABS
1.0000 | ORAL_TABLET | Freq: Two times a day (BID) | ORAL | Status: DC
Start: 2015-04-20 — End: 2015-05-09

## 2015-04-20 MED ORDER — TIZANIDINE HCL 2 MG PO TABS: 2.0000 mg | ORAL_TABLET | Freq: Every day | ORAL | Status: DC

## 2015-04-20 NOTE — Progress Notes (Signed)
Subjective:    Patient ID: Nicholas Douglas, male    DOB: January 17, 1932, 82 y.o.   MRN: [id number]  04/20/2015  Fatigue; Neck Pain; and Nasal Congestion   HPI This 82 y.o. male presents for evaluation of cough, nasal congestion, fatigue, neck pain.  Wife diagnosed with acute influenza infection on 03-27-15; patient treated with prophylaxis Tamiflu yet still suffered with acute symptoms.  Continues to feel poorly with persistent cough and congestion. No recent fever/chills/sweats.  +continues to suffer with neck pain and back pain. No ear pain yet +ear congestion; hearing is muffled; +rhinorrhea clear.  No sore throat or ear pain.  No sinus pressure. +PND.  +coughing; +yellow-white sputum.  No SOB.  No n/v/d.  +fatigue persistent; has been staying at home a lot.  Taking Mucinex DM, Robitussin, and Tylenol.  S/p flu vaccine. Worried about pneumonia.  Has been wheezing at home.  Neck pain/thoracic pain/lower back pain: onset six weeks ago; s/p adjustment by Dr. Roderic Scarce; lower back pain has improved; continues to suffer with neck pain and thoracic pain; no radiation into arms; no n/t/w.  Taking Tylenol.  Melanoma: recent diagnosis of melanoma R upper back; s/p large resection at [facility]; refused lymph node biopsy due to 6% change of metastasis to nodes; healing well.    Renal cell carcinoma: history; followed annually by nephrology.     Review of Systems  Constitutional: Positive for fatigue. Negative for fever, chills, diaphoresis, activity change and appetite change.  HENT: Positive for postnasal drip, rhinorrhea and voice change. Negative for ear discharge, ear pain, sinus pressure, sore throat and trouble swallowing.   Respiratory: Positive for cough and wheezing. Negative for shortness of breath.   Cardiovascular: Negative for chest pain, palpitations and leg swelling.  Gastrointestinal: Negative for nausea, vomiting, abdominal pain and diarrhea.  Endocrine: Negative for cold intolerance,  heat intolerance, polydipsia, polyphagia and polyuria.  Musculoskeletal: Positive for myalgias, back pain, neck pain and neck stiffness.  Skin: Negative for color change, rash and wound.  Neurological: Negative for dizziness, tremors, seizures, syncope, facial asymmetry, speech difficulty, weakness, light-headedness, numbness and headaches.  Psychiatric/Behavioral: Negative for sleep disturbance and dysphoric mood. The patient is not nervous/anxious.     Past Medical History  Diagnosis Date  . Hypertension   . Personal history of colonic polyps   . Other abnormal blood chemistry   . Other nonspecific abnormal serum enzyme levels   . Coronary atherosclerosis of unspecified type of vessel, native or graft   . Impotence of organic origin   . Tobacco use disorder   . Pure hypercholesterolemia   . Internal hemorrhoids without mention of complication   . Myocardial infarction ([place]) 1976  . Unspecified congenital cystic kidney disease   . Calculus of kidney     "that's how they found the cancer" (04/27/2013)  . BPH (benign prostatic hyperplasia)     Followed by [name]/urology  . Melanoma ([place]) 06/2010    Left arm  . Malignant neoplasm of kidney, except pelvis   . Melanoma of back ([place]) 02/07/2015    R upper back; excision [facility].   Past Surgical History  Procedure Laterality Date  . Nephrectomy Left 2006  . Melanoma excision Left ~ 2007    "arm"  . Colonoscopy  10/05/2008    single polyp, [facility].  [name].  Repeat in 3 years.  . Carotid dopplers  03/08/2011    minimal plaque formation B. Symptoms: dizziness.  . Cardiolite  05/06/2011    low risk study; normal  EF.  SE H&V.  Marland Kitchen Coronary angioplasty      "I've had 4" (04/27/2013)  . Coronary angioplasty with stent placement  04/2013; 04/27/2013    "2 + 1" (04/27/2013)  . Appendectomy  2014  . Eye surgery  11/04/2013    Cataract surgery B; [facility].  Marland Kitchen Percutaneous coronary rotoblator intervention (pci-r) N/A 04/06/2013    Procedure: PERCUTANEOUS  CORONARY ROTOBLATOR INTERVENTION (PCI-R);  Surgeon: [name], MD;  Location: [facility] CATH LAB;  Service: Cardiovascular;  Laterality: N/A;  . Percutaneous coronary stent intervention (pci-s) N/A 04/27/2013    Procedure: PERCUTANEOUS CORONARY STENT INTERVENTION (PCI-S);  Surgeon: [name], MD;  Location: [facility] CATH LAB;  Service: Cardiovascular;  Laterality: N/A;  . Melanoma excision  02/07/2015    R upper back. [facility]   Allergies  Allergen Reactions  . Lovastatin Other (See Comments)    Elevated liver enzymes  . Morphine Hives  . Morphine And Related Hives  . Nifedipine Other (See Comments)    Elevated liver enzymes    Social History   Social History  . Marital Status: Married    Spouse Name: N/A  . Number of Children: 3  . Years of Education: college   Occupational History  . minister     Personal assistant   Social History Main Topics  . Smoking status: Former Smoker    Types: Pipe, Landscape architect  . Smokeless tobacco: Current User    Types: Chew     Comment: 04/27/2013 "quit smoking & chewing last year"  . Alcohol Use: 3.0 oz/week    5 Glasses of wine per week  . Drug Use: No  . Sexual Activity: Yes   Other Topics Concern  . Not on file   Social History Narrative   Marital status:  Married x 30 years; 2nd marriage      Children:  3 children, 2 step-children and 10 grandchildren.        Lives:  Lives with wife.         Employment:  Retired; Environmental education officer      Tobacco:  Former user; chews tobacco.      Alcohol:  5 glasses of wine per week       Drugs:  No drugs.        Exercise: 5 x week Light; plays golf 5 days per week but rides golf cart, walking 1 mile.         Advanced Directives:  Patient DOES NOT have living will; desires DNR/DNI.       ADLS: independent with all ADLs; drives.  No falls.  Does not use assistant devices with ambulation.     Family History  Problem Relation Age of Onset  . Heart disease Mother     CAD, AAA.  Marland Kitchen AAA (abdominal aortic aneurysm) Mother         Objective:    BP 118/84 mmHg  Pulse 61  Temp(Src) 97.9 F (36.6 C)  Resp 18  Ht 5\' 7"  (1.702 m)  Wt 196 lb (88.905 kg)  BMI 30.69 kg/m2  SpO2 96% Physical Exam  Constitutional: He is oriented to person, place, and time. He appears well-developed and well-nourished. No distress.  HENT:  Head: Normocephalic and atraumatic.  Right Ear: Tympanic membrane, external ear and ear canal normal.  Left Ear: Tympanic membrane, external ear and ear canal normal.  Nose: Mucosal edema and rhinorrhea present. Right sinus exhibits no maxillary sinus tenderness and no frontal sinus tenderness. Left sinus exhibits no maxillary  sinus tenderness and no frontal sinus tenderness.  Mouth/Throat: Uvula is midline, oropharynx is clear and moist and mucous membranes are normal.  Eyes: Conjunctivae and EOM are normal. Pupils are equal, round, and reactive to light.  Neck: Normal range of motion. Neck supple. Carotid bruit is not present. No thyromegaly present.  Cardiovascular: Normal rate, regular rhythm, normal heart sounds and intact distal pulses.  Exam reveals no gallop and no friction rub.   No murmur heard. Pulmonary/Chest: Effort normal. He has no wheezes. He has rales in the right lower field and the left lower field.  Abdominal: Soft. Bowel sounds are normal. He exhibits no distension and no mass. There is no tenderness. There is no rebound and no guarding.  Musculoskeletal:       Right shoulder: Normal.       Left shoulder: Normal.       Cervical back: He exhibits tenderness, pain and spasm. He exhibits normal range of motion and no bony tenderness.       Thoracic back: He exhibits tenderness, pain and spasm. He exhibits normal range of motion and no bony tenderness.  Cervical spine: non-tender midline; + tender paraspinal regions L; full ROM cervical spine without limitation.  Motor 5/5 BUE.  Grip 5/5. Thoracic spine: +TTP paraspinal muscles B.   Lymphadenopathy:    He has no cervical  adenopathy.  Neurological: He is alert and oriented to person, place, and time. No cranial nerve deficit.  Skin: Skin is warm and dry. No rash noted. He is not diaphoretic.  Psychiatric: He has a normal mood and affect. His behavior is normal.  Nursing note and vitals reviewed.       Assessment & Plan:   1. Cough   2. Influenza   3. Neck pain   4. Postviral fatigue syndrome   5. Melanoma in situ of back ([place])   6. History of renal cell carcinoma     Orders Placed This Encounter  Procedures  . DG Chest 2 View    Standing Status: Future     Number of Occurrences: 1     Standing Expiration Date: 04/19/2016    Order Specific Question:  Reason for Exam (SYMPTOM  OR DIAGNOSIS REQUIRED)    Answer:  neck pain for one month    Order Specific Question:  Preferred imaging location?    Answer:  External  . DG Cervical Spine 2 or 3 views    Standing Status: Future     Number of Occurrences: 1     Standing Expiration Date: 04/19/2016    Order Specific Question:  Reason for Exam (SYMPTOM  OR DIAGNOSIS REQUIRED)    Answer:  neck pain for one month    Order Specific Question:  Preferred imaging location?    Answer:  External  . Comprehensive metabolic panel  . POCT CBC   Meds ordered this encounter  Medications  . tiZANidine (ZANAFLEX) 2 MG tablet    Sig: Take 1 tablet (2 mg total) by mouth at bedtime.    Dispense:  30 tablet    Refill:  0  . amoxicillin-clavulanate (AUGMENTIN) 875-125 MG tablet    Sig: Take 1 tablet by mouth 2 (two) times daily.    Dispense:  20 tablet    Refill:  0    Return if symptoms worsen or fail to improve.    Kristi Elayne Guerin, M.D. [facility] [address] [place], [place]  [address] [phone] phone [phone])  [phone] fax

## 2015-04-20 NOTE — Patient Instructions (Signed)

## 2015-05-07 ENCOUNTER — Other Ambulatory Visit: Payer: Self-pay | Admitting: Family Medicine

## 2015-05-07 DIAGNOSIS — E875 Hyperkalemia: Secondary | ICD-10-CM

## 2015-05-07 DIAGNOSIS — N289 Disorder of kidney and ureter, unspecified: Secondary | ICD-10-CM

## 2015-05-09 ENCOUNTER — Encounter: Payer: Self-pay | Admitting: Cardiovascular Disease

## 2015-05-09 ENCOUNTER — Ambulatory Visit (INDEPENDENT_AMBULATORY_CARE_PROVIDER_SITE_OTHER): Payer: Medicare Other | Admitting: Cardiovascular Disease

## 2015-05-09 VITALS — BP 154/92 | HR 55 | Ht 67.75 in | Wt 193.2 lb

## 2015-05-09 DIAGNOSIS — I25810 Atherosclerosis of coronary artery bypass graft(s) without angina pectoris: Secondary | ICD-10-CM

## 2015-05-09 DIAGNOSIS — I10 Essential (primary) hypertension: Secondary | ICD-10-CM

## 2015-05-09 DIAGNOSIS — R0609 Other forms of dyspnea: Secondary | ICD-10-CM

## 2015-05-09 DIAGNOSIS — E785 Hyperlipidemia, unspecified: Secondary | ICD-10-CM

## 2015-05-09 DIAGNOSIS — I714 Abdominal aortic aneurysm, without rupture, unspecified: Secondary | ICD-10-CM

## 2015-05-09 DIAGNOSIS — I2510 Atherosclerotic heart disease of native coronary artery without angina pectoris: Secondary | ICD-10-CM

## 2015-05-09 LAB — CBC WITH DIFFERENTIAL/PLATELET
BASOS PCT: 0 %
Basophils Absolute: 0 cells/uL (ref 0–200)
EOS ABS: 219 {cells}/uL (ref 15–500)
Eosinophils Relative: 3 %
HEMATOCRIT: 47.7 % (ref 38.5–50.0)
HEMOGLOBIN: 16.1 g/dL (ref 13.2–17.1)
LYMPHS ABS: 2263 {cells}/uL (ref 850–3900)
LYMPHS PCT: 31 %
MCH: 29.1 pg (ref 27.0–33.0)
MCHC: 33.8 g/dL (ref 32.0–36.0)
MCV: 86.1 fL (ref 80.0–100.0)
MONO ABS: 584 {cells}/uL (ref 200–950)
MPV: 9.2 fL (ref 7.5–12.5)
Monocytes Relative: 8 %
NEUTROS PCT: 58 %
Neutro Abs: 4234 cells/uL (ref 1500–7800)
Platelets: 252 10*3/uL (ref 140–400)
RBC: 5.54 MIL/uL (ref 4.20–5.80)
RDW: 13.5 % (ref 11.0–15.0)
WBC: 7.3 10*3/uL (ref 3.8–10.8)

## 2015-05-09 LAB — COMPREHENSIVE METABOLIC PANEL
ALT: 40 U/L (ref 9–46)
AST: 36 U/L — AB (ref 10–35)
Albumin: 4.6 g/dL (ref 3.6–5.1)
Alkaline Phosphatase: 56 U/L (ref 40–115)
BILIRUBIN TOTAL: 1.2 mg/dL (ref 0.2–1.2)
BUN: 20 mg/dL (ref 7–25)
CHLORIDE: 102 mmol/L (ref 98–110)
CO2: 26 mmol/L (ref 20–31)
CREATININE: 1.13 mg/dL — AB (ref 0.70–1.11)
Calcium: 9.6 mg/dL (ref 8.6–10.3)
GLUCOSE: 83 mg/dL (ref 65–99)
Potassium: 5 mmol/L (ref 3.5–5.3)
SODIUM: 141 mmol/L (ref 135–146)
Total Protein: 7.5 g/dL (ref 6.1–8.1)

## 2015-05-09 LAB — TSH: TSH: 3.01 mIU/L (ref 0.40–4.50)

## 2015-05-09 LAB — LIPID PANEL
Cholesterol: 157 mg/dL (ref 125–200)
HDL: 45 mg/dL (ref >=40)
LDL CALC: 81 mg/dL (ref <130)
TRIGLYCERIDES: 154 mg/dL — AB (ref <150)
Total CHOL/HDL Ratio: 3.5 Ratio (ref <=5.0)
VLDL: 31 mg/dL — AB (ref <30)

## 2015-05-09 LAB — T4, FREE: Free T4: 1.2 ng/dL (ref 0.8–1.8)

## 2015-05-09 LAB — T3, FREE: T3, Free: 3.4 pg/mL (ref 2.3–4.2)

## 2015-05-09 MED ORDER — ATENOLOL 25 MG PO TABS: ORAL_TABLET | ORAL | Status: DC

## 2015-05-09 MED ORDER — VALSARTAN 80 MG PO TABS: 80.0000 mg | ORAL_TABLET | Freq: Every day | ORAL | Status: DC

## 2015-05-09 NOTE — Patient Instructions (Signed)
Schedule Abd Duplex for AAA  Schedule Lexiscan Myoview  Schedule Echo  Fasting La b work today  ( Cmet,Lipid panel,cbc,tsh,free,free t3 )  Decrease Atenolol to 12.5 mg daily   Your physician recommends that you schedule a follow-up appointment in: 1 month after all test

## 2015-05-11 ENCOUNTER — Encounter: Payer: Self-pay | Admitting: Cardiovascular Disease

## 2015-05-11 DIAGNOSIS — R0609 Other forms of dyspnea: Secondary | ICD-10-CM | POA: Insufficient documentation

## 2015-05-11 NOTE — Progress Notes (Signed)
Patient ID: Nicholas Douglas, male   DOB: 1931-09-29, 82 y.o.   MRN: [id number]     HPI: Nicholas Douglas is an 82 y.o. Caucasian male who presents to the office today for an 80-month follow-up evaluation.  Nicholas Douglas has known CAD and underwent numerous interventions dating back to 30, 1991, 1993, and in 1997. Cardiac catheterization in 2011 which showed preserved LV function with mild residual distal inferior apical hypocontractility. There is evidence for coronary calcification with segmental narrowing of his LAD of 30-40% proximally, 50% diffusely, in the midsegment, and 70-80% in the distal region, he had AV groove circumflex stenoses of 70 and 50% with a 40% OM 2 stenosis, and a 30-40 and 50% RCA stenoses with 80-90% stenosis in the acute marginal branch. He had been on medical therapy.  He  presented to [facility] in March 2015 with class IV angina and catheterization demonstrated severe 2 vessel CAD with significant current calcification of the LAD with up to 95% mid LAD stenosis and diffuse proximal stenosis. He underwent successful high-speed rotational atherectomy the following day by Dr. Martinique and a 1.5 mm bur was used. Mid LAD was stented with a 2.75x38 mm Promus stent in the proximal LAD was stented with a 3.0x28 mm Promus stent. Medical therapy was recommended for his distal LAD stenosis. He has only one kidney and staged intervention to the left circumflex coronary artery was recommended.  He underwent staged left circumflex PCI on 04/27/2013 by me with successful high-speed rotational atherectomy with a 1.5 and 1.75 mm burr, and ultimately had a 2.5x28 mm Promus premier DES stent inserted into the circumflex vessel, which was post dilated to approximately 2.6 mm.  Subsequently he has felt significantly improved with resolution of any chest pain  He  has a history of significant hyperlipidemia and has had significant increased number of small LDL particles and insulin resistance. This seemed  to markedly improve with the addition of Niaspan added to zetia and lovaza.  Remotely, he had been on statins consisting of Lipitor and subsequently Crestor. He did have transient LFT elevation. He also concerns of possible risk of developing dementia with statin therapy.  He had  some episodes of Niaspan induced diffuse flushing. He wanted to stop taking his Niaspan. He did have subsequent NMR off Niaspan and done just on Zetia and as well as 2 g of Lovaza.  This showed increased abnormalities such that his total cholesterol was 201, LDL had risen to 124, but he now had LDL small particles which have increased from 685 to 1348. Laboratory on 10/26/2012: LDL particle number was elevated at 1657, LDL cholesterol 112 triglycerides 155 and total cholesterol 190. He continued to be insulin resistant with insulin resistance scored 65. When I last saw him, we elected to try Livalo and ultimately titrated this to 4 mg daily to take in addition to his Zetia 10 mg. Laboratory on 02/22/2013: LDL particle number was  markedly improved at 774 with an HDL of 40 calculated LDL 50 small LDL particle #417. Insulin resistance score was also improved at 51.  In July 2015 he developed abdominal discomfort and was found to have acute appendicitis. On CT imaging he was also noted to have prostate enlargement with nodularity and thickening of the bladder base and cystoscopy was suggested for further evaluation. He is status post left nephrectomy. He also was noted to have a 2.3 cm infrarenal suprailiac abdominal aortic aneurysm.  He has been on Livalo 2 mg, Zetia  10 mg for hyperlipidemia.  Lab work on 12/22/2013 showed a cholesterol 155, triglycerides 130, HDL 45, LDL 84.  He has been on amlodipine 10 mg atenolol 12.5 mg twice a day and valsartan 80 mg daily for blood pressure.  He continues to be on aspirin and Plavix for dual antiplatelet therapy.  A nuclear perfusion study on 06/30/2014  revealed normal perfusion and function with  an ejection fraction of 57%.    When he underwent a CT scan for his appendicitis he was told of having a small abdominal aortic aneurysm.  His mother also had an abdominal aortic aneurysm.  The patient recently sprained his right ankle.  As result, he has not been able to be as active as he had in the past and has not been able to play golf which typically he had played up to 4 times per week, often scoring in the 70s.    Since I last saw him, he has noticed more exertional dyspnea.  He admits to fatigue and no energy.  He admits to occasional back discomfort.  Occasionally has noticed some chest pain radiating to his left arm.  He denies palpitations.  He denies presyncope.  He has not yet had his abdominal ultrasound.  He presents for evaluation  Past Medical History  Diagnosis Date  . Hypertension   . Personal history of colonic polyps   . Other abnormal blood chemistry   . Other nonspecific abnormal serum enzyme levels   . Coronary atherosclerosis of unspecified type of vessel, native or graft   . Impotence of organic origin   . Tobacco use disorder   . Pure hypercholesterolemia   . Internal hemorrhoids without mention of complication   . Myocardial infarction ([place]) 1976  . Unspecified congenital cystic kidney disease   . Calculus of kidney     "that's how they found the cancer" (04/27/2013)  . BPH (benign prostatic hyperplasia)     Followed by [name]  . Melanoma ([place]) 06/2010    Left arm  . Malignant neoplasm of kidney, except pelvis   . 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    Past Surgical History  Procedure Laterality Date  . Nephrectomy Left 2006  . Melanoma excision Left ~ 2007    "arm"  . Colonoscopy  10/05/2008    single polyp, [facility]H.  [name].  Repeat in 3 years.  . Carotid dopplers  03/08/2011    minimal plaque formation B. Symptoms: dizziness.  . Cardiolite  05/06/2011    low risk study; normal EF.  SE H&V.  Marland Kitchen Coronary angioplasty        "I've had 4" (04/27/2013)  . Coronary angioplasty with stent placement  04/2013; 04/27/2013    "2 + 1" (04/27/2013)  . Appendectomy  2014  . Eye surgery  11/04/2013    Cataract surgery B; [facility].  Marland Kitchen Percutaneous coronary rotoblator intervention (pci-r) N/A 04/06/2013    Procedure: PERCUTANEOUS CORONARY ROTOBLATOR INTERVENTION (PCI-R);  Surgeon: [name], MD;  Location: [facility] CATH LAB;  Service: Cardiovascular;  Laterality: N/A;  . Percutaneous coronary stent intervention (pci-s) N/A 04/27/2013    Procedure: PERCUTANEOUS CORONARY STENT INTERVENTION (PCI-S);  Surgeon: [name], MD;  Location: [facility] CATH LAB;  Service: Cardiovascular;  Laterality: N/A;  . Melanoma excision  02/07/2015    R upper back. [facility]    Allergies  Allergen Reactions  . Lovastatin Other (See Comments)    Elevated liver enzymes  . Morphine  Hives  . Morphine And Related Hives  . Nifedipine Other (See Comments)    Elevated liver enzymes    Current Outpatient Prescriptions  Medication Sig Dispense Refill  . amLODipine (NORVASC) 10 MG tablet Take 1 tablet (10 mg total) by mouth daily. 90 tablet 2  . aspirin 81 MG tablet Take 81 mg by mouth daily.    Marland Kitchen atenolol (TENORMIN) 25 MG tablet Take 1/2 tablet daily 30 tablet 6  . clopidogrel (PLAVIX) 75 MG tablet Take 1 tablet (75 mg total) by mouth daily. 90 tablet 3  . ezetimibe (ZETIA) 10 MG tablet Take 1 tablet (10 mg total) by mouth daily. 90 tablet 2  . finasteride (PROSCAR) 5 MG tablet Take 1 tablet (5 mg total) by mouth daily. 90 tablet 3  . Icosapent Ethyl 1 g CAPS Take 1 g by mouth 2 (two) times daily. 180 capsule 3  . nitroGLYCERIN (NITROSTAT) 0.4 MG SL tablet Place 1 tablet (0.4 mg total) under the tongue every 5 (five) minutes as needed for chest pain. 25 tablet 2  . Pitavastatin Calcium (LIVALO) 2 MG TABS Take 1 tablet (2 mg total) by mouth once. 90 tablet 3  . valsartan (DIOVAN) 80 MG tablet Take 1 tablet (80 mg total) by mouth daily. 90 tablet 3   No current  facility-administered medications for this visit.    Socially he remains active. He is married has 5 children 10 grandchildren 2 great-grandchildren. Is no alcohol use. He typically scores below 75 and plays golf 4 days per week.  He has turned 82 any typically scores in the 70s, better than his age.  ROS General: Negative; No fevers, chills, or night sweats;  HEENT: Negative; No changes in vision or hearing, sinus congestion, difficulty swallowing Pulmonary: Negative; No cough, wheezing, shortness of breath, hemoptysis Cardiovascular: See history of present illness; No chest pain, presyncope, syncope, palpatations GI: Negative; No nausea, vomiting, diarrhea, or abdominal pain GU: Negative; No dysuria, hematuria, or difficulty voiding Musculoskeletal: Negative; no myalgias, joint pain, or weakness Hematologic/Oncology: Negative; no easy bruising, bleeding Endocrine: Negative; no heat/cold intolerance; no diabetes Neuro: Negative; no changes in balance, headaches Skin: Negative; No rashes or skin lesions Psychiatric: Negative; No behavioral problems, depression Sleep: Negative; No snoring, daytime sleepiness, hypersomnolence, bruxism, restless legs, hypnogognic hallucinations, no cataplexy Other comprehensive 14 point system review is negative.   PE BP 154/92 mmHg  Pulse 55  Ht 5' 7.75" (1.721 m)  Wt 193 lb 3.2 oz (87.635 kg)  BMI 29.59 kg/m2  Repeat blood pressure by me 112/72  Wt Readings from Last 3 Encounters:  05/09/15 193 lb 3.2 oz (87.635 kg)  04/20/15 196 lb (88.905 kg)  01/12/15 192 lb 1.6 oz (87.136 kg)   General: Alert, oriented, no distress.  Skin: normal turgor, no rashes HEENT: Normocephalic, atraumatic. Pupils round and reactive; sclera anicteric;no lid lag.  Nose without nasal septal hypertrophy Mouth/Parynx benign; Mallinpatti scale 2 Neck: No JVD, no carotid bruits; normal carotid up stroke Lungs: clear to ausculatation and percussion; no wheezing or  rales Chest wall: Nontender to palpation  Heart: RRR, s1 s2 normal 1/6 sem; no diastolic murmur. No rubs, thrills, or heaves. Abdomen: soft, nontender; no hepatosplenomehaly, BS+; abdominal aorta nontender and not significantly dilated by palpation. Back: No CVA tenderness. Pulses 2+ Extremities: Right ankle in an ankle brace; no clubbing cyanosis or edema, Homan's sign negative  Neurologic: grossly nonfocal Psychological: Normal affect and mood  ECG (independently read by me): Sinus bradycardia 55 bpm.  No  ectopy.  No significant ST changes.  Normal intervals.  01/12/2015 ECG (independently read by me): Sinus bradycardia 54 bpm.  No ectopy.  Normal intervals.  June 2016 ECG (independently read by me): Sinus bradycardia 58 bpm.  Normal intervals.  No ectopy. Non-specific T change aVL  November 2015 ECG (independently read by me): Sinus bradycardia 54 bpm.  No significant ST-T changes.  Normal intervals.  May 2015 ECG (independently read by me): Sinus bradycardia 54 beats per minute.  No ectopy.  QTc interval 396 ms.  No significant ST changes.  04/22/2013 ECG (independently read by me): Sinus bradycardia 55 beats per minute. Nonspecific ST changes  03/01/2013 ECG (independently read by me): Sinus bradycardia 52 beats per minute. Normal intervals. No significant ST changes.  Prior ECG of 11/20/2012: Sinus rhythm at 51 beats per minute. QTc interval 400 ms. No significant ST changes.  LABS:  BMP Latest Ref Rng 05/09/2015 04/20/2015 01/02/2015  Glucose 65 - 99 mg/dL 83 88 91  BUN 7 - 25 mg/dL _0 Creatinine 0.70 - 1.11 mg/dL 1.13(H) 1.46(H) 1.19  BUN/Creat Ratio 10 - 22 - - 13  Sodium 135 - 146 mmol/L 141 141 142  Potassium 3.5 - 5.3 mmol/L 5.0 5.8(H) 5.0  Chloride 98 - 110 mmol/L 102 103 103  CO2 20 - 31 mmol/L _1 Calcium 8.6 - 10.3 mg/dL 9.6 10.4(H) 9.3   Hepatic Function Latest Ref Rng 05/09/2015 04/20/2015 01/02/2015  Total Protein 6.1 - 8.1 g/dL 7.5 7.8 7.0  Albumin  3.6 - 5.1 g/dL 4.6 4.8 4.3  AST 10 - 35 U/L 36(H) 33 27  ALT 9 - 46 U/L 40 35 33  Alk Phosphatase 40 - 115 U/L 56 59 56  Total Bilirubin 0.2 - 1.2 mg/dL 1.2 0.9 0.9  Bilirubin, Direct 0.00 - 0.40 mg/dL - - -   CBC Latest Ref Rng 05/09/2015 04/20/2015 12/22/2013  WBC 3.8 - 10.8 K/uL 7.3 8.5 6.1  Hemoglobin 13.2 - 17.1 g/dL 16.1 16.5 15.8  Hematocrit 38.5 - 50.0 % 47.7 46.3 46.2  Platelets 140 - 400 K/uL 252 - 261   Lab Results  Component Value Date   MCV 86.1 05/09/2015   MCV 85.3 04/20/2015   MCV 86.0 12/22/2013   Lab Results  Component Value Date   TSH 3.01 05/09/2015  .  Lipid Panel     Component Value Date/Time   CHOL 157 05/09/2015 1400   CHOL 144 01/02/2015 0805   TRIG 154* 05/09/2015 1400   TRIG 131 02/22/2013 0804   HDL 45 05/09/2015 1400   HDL 46 01/02/2015 0805   HDL 40 02/22/2013 0804   CHOLHDL 3.5 05/09/2015 1400   CHOLHDL 3.1 01/02/2015 0805   VLDL 31* 05/09/2015 1400   LDLCALC 81 05/09/2015 1400   LDLCALC 71 01/02/2015 0805   LDLCALC 50 02/22/2013 0804   RADIOLOGY: Dg Chest 2 View  08/17/2012   *RADIOLOGY REPORT*  Clinical Data: Renal cell carcinoma  CHEST - 2 VIEW  Comparison: 05/08/11  Findings: The cardiomediastinal silhouette is stable.  No acute infiltrate or pleural effusion.  No pulmonary edema.  Bony thorax is unremarkable.  IMPRESSION: No active disease.  No significant change.   Original Report Authenticated By: Lahoma Crocker, M.D.      ASSESSMENT AND PLAN: Nicholas Douglas is a young appearing 82 year old gentleman who has CAD dating back to 5 and has undergone multiple interventions in the past over a 10 year period from 63 to 1997. He  developed unstable angina symptomatology in March 2015 and catheterization done initially at [facility] showed multivessel disease.  Subsequently, he has undergone successful staged rotational coronary atherectomy initially involving the RCA in early March, and then on 04/27/2013 repeat intervention was done to the  left circumflex system with rotational atherectomy.  At that time, he had a widely patent stent of his RCA, as well as a widely patent stent in his LAD.  He also had 60-70% mid LAD stenosis, which had not progressed.  Since I last saw him, he has noticed a change in symptomatology with the development of more shortness of breath, and no energy.  He also has noticed occasional episodes of vague chest discomfort with possible left arm radiation.  He has a history of hypertension.  His blood pressure today is well controlled on his current medical regimen consisting of amlodipine 10 mg, atenolol 12.5 mg twice a day and valsartan 80 mg.  He is on lipid lowering therapy and is tolerating Livalo 2 mg in addition to Zetia and fish oil.  His recent laboratory demonstrates continued excellent benefit with a total cholesterol 144, triglycerides 134, HDL 46, and LDL 71.  He was fasting today and repeat blood work shows a total cholesterol of 157 and LDL cholesterol 81.  Since he was fasting.  I have checked a complete set of laboratory in the fasting state.  With his episode of vague chest pressure with left arm radiation and exertional shortness of breath.  I am adding isosorbide mononitrate to his medical regimen at 30 mg initially.  Due to his marked fatigue and sinus bradycardia.  He will reduce his atenolol to just 12.5 mg daily.  I will schedule him for his abdominal ultrasound to assess his aortic aneurysm area.  I will see him in 4 weeks for cardiology reevaluation.  Time spent: 25 minutes  [name], MD, [facility]  05/11/2015 11:59 AM

## 2015-05-16 ENCOUNTER — Other Ambulatory Visit: Payer: Self-pay | Admitting: Family Medicine

## 2015-05-17 ENCOUNTER — Telehealth: Payer: Self-pay | Admitting: Cardiovascular Disease

## 2015-05-17 MED ORDER — VALSARTAN 80 MG PO TABS: 80.0000 mg | ORAL_TABLET | Freq: Every day | ORAL | Status: DC

## 2015-05-17 NOTE — Telephone Encounter (Signed)
Do you want to RF? 

## 2015-05-17 NOTE — Telephone Encounter (Signed)
Called to clarify with pt which pharmacy to sent Rx to. Pt needs it sent to [facility]. Local [facility] pharmacy notified that we discontinued Rx there.  Rx sent to [facility].

## 2015-05-17 NOTE — Telephone Encounter (Signed)
New message       *STAT* If patient is at the pharmacy, call can be transferred to refill team.   1. Which medications need to be refilled? (please list name of each medication and dose if known) valsartan 80mg  2. Which pharmacy/location (including street and city if local pharmacy) is medication to be sent to? [facility]  3. Do they need a 30 day or 90 day supply? 90 day supply

## 2015-05-24 ENCOUNTER — Telehealth (HOSPITAL_COMMUNITY): Payer: Self-pay

## 2015-05-24 NOTE — Telephone Encounter (Signed)
Encounter complete. 

## 2015-05-25 ENCOUNTER — Ambulatory Visit (HOSPITAL_COMMUNITY): Payer: Medicare Other | Attending: Cardiology

## 2015-05-25 ENCOUNTER — Other Ambulatory Visit: Payer: Self-pay

## 2015-05-25 DIAGNOSIS — Z8249 Family history of ischemic heart disease and other diseases of the circulatory system: Secondary | ICD-10-CM | POA: Diagnosis not present

## 2015-05-25 DIAGNOSIS — Z87891 Personal history of nicotine dependence: Secondary | ICD-10-CM | POA: Diagnosis not present

## 2015-05-25 DIAGNOSIS — I714 Abdominal aortic aneurysm, without rupture, unspecified: Secondary | ICD-10-CM

## 2015-05-25 DIAGNOSIS — I059 Rheumatic mitral valve disease, unspecified: Secondary | ICD-10-CM | POA: Insufficient documentation

## 2015-05-25 DIAGNOSIS — I10 Essential (primary) hypertension: Secondary | ICD-10-CM

## 2015-05-25 DIAGNOSIS — I25810 Atherosclerosis of coronary artery bypass graft(s) without angina pectoris: Secondary | ICD-10-CM | POA: Diagnosis not present

## 2015-05-25 DIAGNOSIS — I119 Hypertensive heart disease without heart failure: Secondary | ICD-10-CM | POA: Insufficient documentation

## 2015-05-25 DIAGNOSIS — E785 Hyperlipidemia, unspecified: Secondary | ICD-10-CM | POA: Insufficient documentation

## 2015-05-26 ENCOUNTER — Ambulatory Visit (HOSPITAL_BASED_OUTPATIENT_CLINIC_OR_DEPARTMENT_OTHER)
Admission: RE | Admit: 2015-05-26 | Discharge: 2015-05-26 | Disposition: A | Discharge status: Home / Self Care | Payer: Medicare Other | Source: Ambulatory Visit | Attending: Cardiovascular Disease | Admitting: Cardiovascular Disease

## 2015-05-26 ENCOUNTER — Ambulatory Visit (HOSPITAL_COMMUNITY)
Admission: RE | Admit: 2015-05-26 | Discharge: 2015-05-26 | Disposition: A | Discharge status: Home / Self Care | Payer: Medicare Other | Source: Ambulatory Visit | Attending: Cardiovascular Disease | Admitting: Cardiovascular Disease

## 2015-05-26 DIAGNOSIS — E785 Hyperlipidemia, unspecified: Secondary | ICD-10-CM

## 2015-05-26 DIAGNOSIS — I714 Abdominal aortic aneurysm, without rupture, unspecified: Secondary | ICD-10-CM

## 2015-05-26 DIAGNOSIS — Z87891 Personal history of nicotine dependence: Secondary | ICD-10-CM | POA: Insufficient documentation

## 2015-05-26 DIAGNOSIS — R079 Chest pain, unspecified: Secondary | ICD-10-CM

## 2015-05-26 DIAGNOSIS — I25810 Atherosclerosis of coronary artery bypass graft(s) without angina pectoris: Secondary | ICD-10-CM

## 2015-05-26 DIAGNOSIS — R5383 Other fatigue: Secondary | ICD-10-CM | POA: Insufficient documentation

## 2015-05-26 DIAGNOSIS — I10 Essential (primary) hypertension: Secondary | ICD-10-CM

## 2015-05-26 DIAGNOSIS — I700 Atherosclerosis of aorta: Secondary | ICD-10-CM | POA: Insufficient documentation

## 2015-05-26 DIAGNOSIS — I708 Atherosclerosis of other arteries: Secondary | ICD-10-CM

## 2015-05-26 DIAGNOSIS — Z8249 Family history of ischemic heart disease and other diseases of the circulatory system: Secondary | ICD-10-CM | POA: Insufficient documentation

## 2015-05-26 DIAGNOSIS — R0609 Other forms of dyspnea: Secondary | ICD-10-CM | POA: Insufficient documentation

## 2015-05-26 DIAGNOSIS — I214 Non-ST elevation (NSTEMI) myocardial infarction: Secondary | ICD-10-CM | POA: Diagnosis not present

## 2015-05-26 DIAGNOSIS — I200 Unstable angina: Secondary | ICD-10-CM | POA: Diagnosis not present

## 2015-05-26 LAB — MYOCARDIAL PERFUSION IMAGING
CHL CUP NUCLEAR SRS: 1
CHL CUP NUCLEAR SSS: 2
CSEPPHR: 71 {beats}/min
LVDIAVOL: 81 mL (ref 62–150)
LVSYSVOL: 33 mL
NUC STRESS TID: 1.11
Rest HR: 52 {beats}/min
SDS: 1

## 2015-05-26 MED ORDER — TECHNETIUM TC 99M SESTAMIBI GENERIC - CARDIOLITE
10.4000 | Freq: Once | INTRAVENOUS | Status: AC | PRN
Start: 2015-05-26 — End: 2015-05-26
  Administered 2015-05-26: 10.4 via INTRAVENOUS

## 2015-05-26 MED ORDER — AMINOPHYLLINE 25 MG/ML IV SOLN: 75.0000 mg | Freq: Once | INTRAVENOUS | Status: DC

## 2015-05-26 MED ORDER — TECHNETIUM TC 99M SESTAMIBI GENERIC - CARDIOLITE
32.9000 | Freq: Once | INTRAVENOUS | Status: AC | PRN
  Administered 2015-05-26: 32.9 via INTRAVENOUS

## 2015-05-26 MED ORDER — REGADENOSON 0.4 MG/5ML IV SOLN
0.4000 mg | Freq: Once | INTRAVENOUS | Status: DC
Start: 2015-05-26 — End: 2015-05-27

## 2015-05-28 ENCOUNTER — Encounter (HOSPITAL_COMMUNITY): Payer: Self-pay | Admitting: Nurse Practitioner

## 2015-05-28 ENCOUNTER — Inpatient Hospital Stay (HOSPITAL_COMMUNITY)
Admission: EM | Admit: 2015-05-28 | Discharge: 2015-05-31 | DRG: 247 | Disposition: A | Discharge status: Home / Self Care | Payer: Medicare Other | Attending: Cardiovascular Disease | Admitting: Cardiovascular Disease

## 2015-05-28 ENCOUNTER — Other Ambulatory Visit: Payer: Self-pay

## 2015-05-28 ENCOUNTER — Emergency Department (HOSPITAL_COMMUNITY): Payer: Medicare Other

## 2015-05-28 DIAGNOSIS — Z7982 Long term (current) use of aspirin: Secondary | ICD-10-CM

## 2015-05-28 DIAGNOSIS — Z87891 Personal history of nicotine dependence: Secondary | ICD-10-CM

## 2015-05-28 DIAGNOSIS — I129 Hypertensive chronic kidney disease with stage 1 through stage 4 chronic kidney disease, or unspecified chronic kidney disease: Secondary | ICD-10-CM | POA: Diagnosis present

## 2015-05-28 DIAGNOSIS — I10 Essential (primary) hypertension: Secondary | ICD-10-CM | POA: Diagnosis not present

## 2015-05-28 DIAGNOSIS — E7800 Pure hypercholesterolemia, unspecified: Secondary | ICD-10-CM | POA: Diagnosis present

## 2015-05-28 DIAGNOSIS — E785 Hyperlipidemia, unspecified: Secondary | ICD-10-CM | POA: Diagnosis present

## 2015-05-28 DIAGNOSIS — I714 Abdominal aortic aneurysm, without rupture, unspecified: Secondary | ICD-10-CM | POA: Diagnosis present

## 2015-05-28 DIAGNOSIS — I209 Angina pectoris, unspecified: Secondary | ICD-10-CM | POA: Diagnosis present

## 2015-05-28 DIAGNOSIS — I214 Non-ST elevation (NSTEMI) myocardial infarction: Principal | ICD-10-CM | POA: Diagnosis present

## 2015-05-28 DIAGNOSIS — Z955 Presence of coronary angioplasty implant and graft: Secondary | ICD-10-CM | POA: Insufficient documentation

## 2015-05-28 DIAGNOSIS — I252 Old myocardial infarction: Secondary | ICD-10-CM

## 2015-05-28 DIAGNOSIS — Z7902 Long term (current) use of antithrombotics/antiplatelets: Secondary | ICD-10-CM

## 2015-05-28 DIAGNOSIS — N400 Enlarged prostate without lower urinary tract symptoms: Secondary | ICD-10-CM | POA: Diagnosis present

## 2015-05-28 DIAGNOSIS — I2510 Atherosclerotic heart disease of native coronary artery without angina pectoris: Secondary | ICD-10-CM

## 2015-05-28 DIAGNOSIS — I25110 Atherosclerotic heart disease of native coronary artery with unstable angina pectoris: Secondary | ICD-10-CM | POA: Diagnosis present

## 2015-05-28 DIAGNOSIS — Z79899 Other long term (current) drug therapy: Secondary | ICD-10-CM

## 2015-05-28 DIAGNOSIS — I25119 Atherosclerotic heart disease of native coronary artery with unspecified angina pectoris: Secondary | ICD-10-CM | POA: Diagnosis present

## 2015-05-28 DIAGNOSIS — Z905 Acquired absence of kidney: Secondary | ICD-10-CM

## 2015-05-28 DIAGNOSIS — I200 Unstable angina: Secondary | ICD-10-CM | POA: Diagnosis present

## 2015-05-28 DIAGNOSIS — I25810 Atherosclerosis of coronary artery bypass graft(s) without angina pectoris: Secondary | ICD-10-CM

## 2015-05-28 DIAGNOSIS — N289 Disorder of kidney and ureter, unspecified: Secondary | ICD-10-CM | POA: Diagnosis present

## 2015-05-28 DIAGNOSIS — Z886 Allergy status to analgesic agent status: Secondary | ICD-10-CM

## 2015-05-28 DIAGNOSIS — R918 Other nonspecific abnormal finding of lung field: Secondary | ICD-10-CM

## 2015-05-28 DIAGNOSIS — Z85820 Personal history of malignant melanoma of skin: Secondary | ICD-10-CM

## 2015-05-28 DIAGNOSIS — N183 Chronic kidney disease, stage 3 unspecified: Secondary | ICD-10-CM | POA: Diagnosis present

## 2015-05-28 DIAGNOSIS — Z9841 Cataract extraction status, right eye: Secondary | ICD-10-CM

## 2015-05-28 DIAGNOSIS — Z9861 Coronary angioplasty status: Secondary | ICD-10-CM

## 2015-05-28 DIAGNOSIS — Z888 Allergy status to other drugs, medicaments and biological substances status: Secondary | ICD-10-CM

## 2015-05-28 DIAGNOSIS — Z9842 Cataract extraction status, left eye: Secondary | ICD-10-CM

## 2015-05-28 DIAGNOSIS — Z85528 Personal history of other malignant neoplasm of kidney: Secondary | ICD-10-CM

## 2015-05-28 HISTORY — DX: Atherosclerotic heart disease of native coronary artery with unstable angina pectoris: I25.110

## 2015-05-28 HISTORY — DX: Unstable angina: I20.0

## 2015-05-28 HISTORY — DX: Coronary angioplasty status: Z98.61

## 2015-05-28 HISTORY — DX: Old myocardial infarction: I25.2

## 2015-05-28 HISTORY — DX: Atherosclerotic heart disease of native coronary artery without angina pectoris: I25.10

## 2015-05-28 LAB — BASIC METABOLIC PANEL
ANION GAP: 9 (ref 5–15)
BUN: 14 mg/dL (ref 6–20)
CALCIUM: 9.2 mg/dL (ref 8.9–10.3)
CO2: 28 mmol/L (ref 22–32)
Chloride: 103 mmol/L (ref 101–111)
Creatinine, Ser: 1.32 mg/dL — ABNORMAL HIGH (ref 0.61–1.24)
GFR calc non Af Amer: 49 mL/min — ABNORMAL LOW (ref >60)
GFR, EST AFRICAN AMERICAN: 56 mL/min — AB (ref >60)
Glucose, Bld: 127 mg/dL — ABNORMAL HIGH (ref 65–99)
POTASSIUM: 4.6 mmol/L (ref 3.5–5.1)
Sodium: 140 mmol/L (ref 135–145)

## 2015-05-28 LAB — CBC
HEMATOCRIT: 43.5 % (ref 39.0–52.0)
HEMOGLOBIN: 14.2 g/dL (ref 13.0–17.0)
MCH: 28.4 pg (ref 26.0–34.0)
MCHC: 32.6 g/dL (ref 30.0–36.0)
MCV: 87 fL (ref 78.0–100.0)
Platelets: 213 10*3/uL (ref 150–400)
RBC: 5 MIL/uL (ref 4.22–5.81)
RDW: 12.8 % (ref 11.5–15.5)
WBC: 5.8 10*3/uL (ref 4.0–10.5)

## 2015-05-28 LAB — I-STAT TROPONIN, ED: Troponin i, poc: 0.16 ng/mL (ref 0.00–0.08)

## 2015-05-28 MED ORDER — ASPIRIN EC 81 MG PO TBEC
81.0000 mg | DELAYED_RELEASE_TABLET | Freq: Every day | ORAL | Status: DC
  Administered 2015-05-30: 81 mg via ORAL
  Filled 2015-05-28 (×2): qty 1

## 2015-05-28 MED ORDER — NITROGLYCERIN 0.4 MG SL SUBL: 0.4000 mg | SUBLINGUAL_TABLET | SUBLINGUAL | Status: DC | PRN

## 2015-05-28 MED ORDER — HEPARIN (PORCINE) IN NACL 100-0.45 UNIT/ML-% IJ SOLN
1000.0000 [IU]/h | INTRAMUSCULAR | Status: DC
  Administered 2015-05-28: 1000 [IU]/h via INTRAVENOUS
  Administered 2015-05-29: 1100 [IU]/h via INTRAVENOUS
  Filled 2015-05-28 (×3): qty 250

## 2015-05-28 MED ORDER — HEPARIN BOLUS VIA INFUSION
4000.0000 [IU] | Freq: Once | INTRAVENOUS | Status: AC
  Administered 2015-05-28: 4000 [IU] via INTRAVENOUS
  Filled 2015-05-28: qty 4000

## 2015-05-28 MED ORDER — OMEGA-3-ACID ETHYL ESTERS 1 G PO CAPS
1.0000 g | ORAL_CAPSULE | Freq: Two times a day (BID) | ORAL | Status: DC
  Administered 2015-05-29 – 2015-05-31 (×4): 1 g via ORAL
  Filled 2015-05-28 (×4): qty 1

## 2015-05-28 MED ORDER — ICOSAPENT ETHYL 1 G PO CAPS: 1.0000 g | ORAL_CAPSULE | Freq: Two times a day (BID) | ORAL | Status: DC

## 2015-05-28 MED ORDER — NITROGLYCERIN 2 % TD OINT
1.0000 [in_us] | TOPICAL_OINTMENT | Freq: Four times a day (QID) | TRANSDERMAL | Status: DC
  Administered 2015-05-28 – 2015-05-30 (×6): 1 [in_us] via TOPICAL
  Filled 2015-05-28 (×2): qty 30

## 2015-05-28 MED ORDER — FUROSEMIDE 10 MG/ML IJ SOLN
10.0000 mg | Freq: Once | INTRAMUSCULAR | Status: AC
  Administered 2015-05-28: 10 mg via INTRAVENOUS
  Filled 2015-05-28: qty 2

## 2015-05-28 MED ORDER — LATANOPROST 0.005 % OP SOLN
1.0000 [drp] | Freq: Every day | OPHTHALMIC | Status: DC
  Administered 2015-05-29: 1 [drp] via OPHTHALMIC
  Filled 2015-05-28: qty 2.5

## 2015-05-28 MED ORDER — TRAZODONE HCL 50 MG PO TABS: 50.0000 mg | ORAL_TABLET | Freq: Every evening | ORAL | Status: DC | PRN

## 2015-05-28 MED ORDER — CLOPIDOGREL BISULFATE 75 MG PO TABS
75.0000 mg | ORAL_TABLET | Freq: Every day | ORAL | Status: DC
  Administered 2015-05-29 – 2015-05-30 (×2): 75 mg via ORAL
  Filled 2015-05-28 (×2): qty 1

## 2015-05-28 MED ORDER — AMLODIPINE BESYLATE 10 MG PO TABS
10.0000 mg | ORAL_TABLET | Freq: Every day | ORAL | Status: DC
  Administered 2015-05-28 – 2015-05-30 (×3): 10 mg via ORAL
  Filled 2015-05-28 (×3): qty 1

## 2015-05-28 MED ORDER — SODIUM CHLORIDE 0.9% FLUSH
3.0000 mL | Freq: Two times a day (BID) | INTRAVENOUS | Status: DC
  Administered 2015-05-29: 3 mL via INTRAVENOUS

## 2015-05-28 MED ORDER — ACETAMINOPHEN 500 MG PO TABS: 500.0000 mg | ORAL_TABLET | Freq: Every day | ORAL | Status: DC | PRN

## 2015-05-28 MED ORDER — IRBESARTAN 75 MG PO TABS
75.0000 mg | ORAL_TABLET | Freq: Every day | ORAL | Status: DC
  Administered 2015-05-29 – 2015-05-31 (×3): 75 mg via ORAL
  Filled 2015-05-28 (×3): qty 1

## 2015-05-28 MED ORDER — EZETIMIBE 10 MG PO TABS
10.0000 mg | ORAL_TABLET | Freq: Every day | ORAL | Status: DC
  Administered 2015-05-29 – 2015-05-31 (×3): 10 mg via ORAL
  Filled 2015-05-28 (×3): qty 1

## 2015-05-28 MED ORDER — ATENOLOL 25 MG PO TABS
12.5000 mg | ORAL_TABLET | Freq: Two times a day (BID) | ORAL | Status: DC
  Administered 2015-05-28: 12.5 mg via ORAL
  Filled 2015-05-28: qty 1

## 2015-05-28 MED ORDER — ASPIRIN 81 MG PO CHEW: 81.0000 mg | CHEWABLE_TABLET | Freq: Every day | ORAL | Status: DC

## 2015-05-28 MED ORDER — FINASTERIDE 5 MG PO TABS
2.5000 mg | ORAL_TABLET | Freq: Every day | ORAL | Status: DC
  Administered 2015-05-29 – 2015-05-31 (×3): 2.5 mg via ORAL
  Filled 2015-05-28 (×3): qty 1

## 2015-05-28 MED ORDER — ASPIRIN 325 MG PO TABS
325.0000 mg | ORAL_TABLET | Freq: Once | ORAL | Status: AC
  Administered 2015-05-28: 325 mg via ORAL
  Filled 2015-05-28: qty 1

## 2015-05-28 NOTE — H&P (Signed)
History & Physical    Patient ID: Nicholas Douglas MRN: [id number], DOB/AGE: October 28, 1931  Admit date: 05/28/2015 Primary Physician: [name], MD Primary Cardiologist: [name], MD  CC:  CP  HPI    82 yo M pt of Dr. [name] w a h/o CAD with numerous interventions dating back to 1987, 1991, 1993, 1997, & most recently 04/2013 (2.75x38 mm Promus to mLAD, 3.0x28 mm Promus to pLAD, & staged 2.5x28 mm Promus LCx), HTN, HLD, & L renal neoplasm s/p nephrectomy (2006) presents with progressive angina over the preceding 2-3 weeks, particularly in the preceding couple of days.  Prior to this period of time, he had only taken NTG every other week.  This increased to several times weekly in the past couple of weeks up to 6 times today alone.  He described the discomfort as a non-radiating, 8/10 substernal chest pressure with associated dyspnea & occasional nausea.  It occurs most frequently at rest, though it can also happen with exertion.  The pain resolves within a couple of minutes of taking NTG.  This is not pattern is not unusual for him, even prior to the preceding couple of weeks.  His last episode of pain occurred ~ 5:30 pm when he arrived to the ED.  Also during this period of time, he noted slightly worsened dyspnea with walking up a hill or bending over.  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  He denied diaphoresis, lightheadedness, palpitations, lower extremity swelling, orthopnea, or paroxysmal nocturnal dyspnea.  He had the flu in February (treated with Tamiflu) & has been weak since that time, unable to play golf since that time.  He saw his primary cardiologist Dr. [name] 05/11/15 when he reported chest pain. Due to his fatigue, Dr. [name] decreased his Atenolol from 12.5 mg BID to 12.5 mg daily.  Also, he was set up for a TTE (05/25/15), which revealed an EF of 55-60% with LVH & Grade I diastolic dysfunction.  Also, a Lexiscan (05/26/15) was  negative for evidence of ischemia with an EF of 55-60%.  Recent home BP measurements have been 140's/70's.    Past Medical History   Past Medical History  Diagnosis Date  . Hypertension   . Personal history of colonic polyps   . Other abnormal blood chemistry   . Other nonspecific abnormal serum enzyme levels   . Coronary atherosclerosis of unspecified type of vessel, native or graft   . Impotence of organic origin   . Tobacco use disorder   . Pure hypercholesterolemia   . Internal hemorrhoids without mention of complication   . Myocardial infarction ([place]) 1976  . Unspecified congenital cystic kidney disease   . Calculus of kidney     "that's how they found the cancer" (04/27/2013)  . BPH (benign prostatic hyperplasia)     Followed by [name]/urology  . Melanoma ([place]) 06/2010    Left arm  . Malignant neoplasm of kidney, except pelvis   . 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    Past Surgical History  Procedure Laterality Date  . Nephrectomy Left 2006  . Melanoma excision Left ~ 2007    "arm"  . Colonoscopy  10/05/2008    single polyp, IH.  [name].  Repeat in 3 years.  . Carotid dopplers  03/08/2011    minimal plaque formation B. Symptoms: dizziness.  . Cardiolite  05/06/2011    low risk study;  normal EF.  SE H&V.  Marland Kitchen Coronary angioplasty      "I've had 4" (04/27/2013)  . Coronary angioplasty with stent placement  04/2013; 04/27/2013    "2 + 1" (04/27/2013)  . Appendectomy  2014  . Eye surgery  11/04/2013    Cataract surgery B; [place].  Marland Kitchen Percutaneous coronary rotoblator intervention (pci-r) N/A 04/06/2013    Procedure: PERCUTANEOUS CORONARY ROTOBLATOR INTERVENTION (PCI-R);  Surgeon: [name], MD;  Location[facility] C[facility] LAB;  Service: Cardiovascular;  Laterality: N/A;  . Percutaneous coronary stent intervention (pci-s) N/A 04/27/2013    Procedure: PERCUTANEOUS CORONARY STENT INTERVENTION (PCI-S);  Surgeon: [name], MD;  Location[facility] CATH LAB;   Service: Cardiovascular;  Laterality: N/A;  . Melanoma excision  02/07/2015    R upper back. [facility]    Allergies  Allergies  Allergen Reactions  . Imdur [Isosorbide Dinitrate]   . Lovastatin Other (See Comments)    Elevated liver enzymes  . Morphine Hives  . Morphine And Related Hives  . Nifedipine Other (See Comments)    Elevated liver enzymes   Home Medications    Prior to Admission medications   Medication Sig Start Date End Date Taking? Authorizing Provider  acetaminophen (TYLENOL) 500 MG tablet Take 500 mg by mouth daily as needed for moderate pain.   Yes Historical Provider, MD  amLODipine (NORVASC) 10 MG tablet Take 1 tablet (10 mg total) by mouth daily. Patient taking differently: Take 10 mg by mouth at bedtime.  11/15/14  Yes [name], MD  aspirin 81 MG tablet Take 81 mg by mouth at bedtime.    Yes Historical Provider, MD  atenolol (TENORMIN) 25 MG tablet Take 1/2 tablet daily Patient taking differently: Take 12.5 mg by mouth daily.  05/09/15  Yes [name], MD  clopidogrel (PLAVIX) 75 MG tablet Take 1 tablet (75 mg total) by mouth daily. 04/05/15  Yes [name], MD  ezetimibe (ZETIA) 10 MG tablet Take 1 tablet (10 mg total) by mouth daily. 12/22/14  Yes [name], MD  finasteride (PROSCAR) 5 MG tablet Take 1 tablet (5 mg total) by mouth daily. Patient taking differently: Take 2.5 mg by mouth daily.  12/22/13  Yes Wardell Honour, MD  Icosapent Ethyl 1 g CAPS Take 1 g by mouth 2 (two) times daily. 04/05/15  Yes [name], MD  latanoprost (XALATAN) 0.005 % ophthalmic solution Place 1 drop into both eyes at bedtime. 05/11/15  Yes Historical Provider, MD  nitroGLYCERIN (NITROSTAT) 0.4 MG SL tablet Place 1 tablet (0.4 mg total) under the tongue every 5 (five) minutes as needed for chest pain. 08/04/14  Yes [name], MD  Pitavastatin Calcium (LIVALO) 2 MG TABS Take 1 tablet (2 mg total) by mouth once. Patient taking differently: Take 2 mg by mouth at bedtime.   03/24/14  Yes [name], MD  traZODone (DESYREL) 50 MG tablet Take 50 mg by mouth at bedtime as needed for sleep.  05/22/15  Yes Historical Provider, MD  valsartan (DIOVAN) 80 MG tablet Take 1 tablet (80 mg total) by mouth daily. 05/17/15  Yes [name], MD   Family History    Family History  Problem Relation Age of Onset  . Heart disease Mother     CAD, AAA.  Marland Kitchen AAA (abdominal aortic aneurysm) Mother    Social History    Social History   Social History  . Marital Status: Married    Spouse Name: N/A  .  Number of Children: 3  . Years of Education: college   Occupational History  . Minister      Personal assistant, [employer] History Main Topics  . Smoking status: Former Smoker    Types: Pipe, Landscape architect  . Smokeless tobacco: Current User    Types: Chew     Comment: 04/27/2013 "quit smoking & chewing last year"  . Alcohol Use: 3.0 oz/week    5 Glasses of wine per week  . Drug Use: No  . Sexual Activity: Yes   Other Topics Concern  . Not on file   Social History Narrative   Marital status:  Married x 30 years; 2nd marriage      Children:  3 children, 2 step-children and 10 grandchildren.        Lives:  Lives with wife.         Employment:  Retired; Environmental education officer      Tobacco:  Former user; chews tobacco.      Alcohol:  5 glasses of wine per week       Drugs:  No drugs.        Exercise: 5 x week Light; plays golf 5 days per week but rides golf cart, walking 1 mile.         Advanced Directives:  Patient DOES NOT have living will; desires DNR/DNI.       ADLS: independent with all ADLs; drives.  No falls.  Does not use assistant devices with ambulation.       Review of Systems    General:  No chills, fever, night sweats or weight changes.  Cardiovascular:  +Chest pain, dyspnea on exertion  No edema, orthopnea, palpitations, paroxysmal nocturnal dyspnea. Dermatological: No rash, lesions/masses Respiratory: No cough, dyspnea Urologic: No hematuria,  dysuria Abdominal:   No nausea, vomiting, diarrhea, bright red blood per rectum, melena, or hematemesis Neurologic:  No visual changes, wkns, changes in mental status. All other systems reviewed and are otherwise negative except as noted above.  Physical Exam    Blood pressure 168/73, pulse 57, temperature 97.4 F (36.3 C), temperature source Oral, resp. rate 18, height 5\' 8"  (1.727 m), weight 87.726 kg (193 lb 6.4 oz), SpO2 99 %.  General: Pleasant, NAD Psych: Normal affect. Neuro: Alert and oriented X 3. Moves all extremities spontaneously HEENT: Normal  Neck: JVD to 2 cm above the clavicle sitting at 45 degrees Lungs:  Minimal bibasilar crackles Heart: RRR 1/6 SEM Abdomen: Soft, non-tender, non-distended, BS + x 4.  Extremities: No clubbing, cyanosis or edema. DP/PT/Radials 2+ and equal bilaterally.  Labs    Troponin [facility] of Care Test)  Recent Labs  05/28/15 1909  TROPIPOC 0.16*    Recent Labs  05/28/15 2330  TROPONINI 0.11*   Lab Results  Component Value Date   WBC 5.8 05/28/2015   HGB 14.2 05/28/2015   HCT 43.5 05/28/2015   MCV 87.0 05/28/2015   PLT 213 05/28/2015    Recent Labs Lab 05/28/15 1856  NA 140  K 4.6  CL 103  CO2 28  BUN 14  CREATININE 1.32*  CALCIUM 9.2  GLUCOSE 127*   Lab Results  Component Value Date   CHOL 157 05/09/2015   HDL 45 05/09/2015   LDLCALC 81 05/09/2015   TRIG 154* 05/09/2015   No results found for: [facility]   Radiology Studies    Dg Chest 2 View  05/28/2015  CLINICAL DATA:  Chronic mid chest tightness, fatigue and shortness of breath.  Initial encounter. EXAM: CHEST  2 VIEW COMPARISON:  Chest radiograph performed 04/20/2015 FINDINGS: The lungs are hypoexpanded. Chronic peribronchial thickening is noted. No pleural effusion or pneumothorax is seen. The heart is normal in size; the mediastinal contour is within normal limits. No acute osseous abnormalities are seen. Clips are noted within the right upper quadrant,  reflecting prior cholecystectomy. IMPRESSION: Lungs hypoexpanded. Chronic peribronchial thickening noted. Lungs otherwise grossly clear. Electronically Signed   By: Garald Balding M.D.   On: 05/28/2015 18:50    ECG & Cardiac Imaging    - ECG:  NSR, subtle inferolateral ST-T abnormalities new compared to 05/07/15 (though similar to 01/2015) - Nuclear perfusion study (06/30/2014):  Normal perfusion, EF 57%  - Cardiac catheterization (04/2013, [place], Class IV Angina, Dr. Martinique):  mLAD 95%, diffuse proximal LAD disease --> 2.75x38 mm Promus to mLAD, 3.0x28 mm Promus to pLAD, & staged 2.5x28 mm Promus LCx, subsequent  staged 2.5x28 mm Promus LCx by Dr. [name] - Cardiac catheterization (2011):  pLAD of 30-40%, mLAD 50%, dLAD 70-80%, AV groove LCx 70 &50%, OM2 40%  RCA 30-40% and 50%, Acute marginal 80-90%.  LVG with preserved EF & mild residual distal inferior apical hypocontractility  Assessment & Plan    82 yo M pt of Dr. [name] w a h/o CAD with numerous interventions dating back to 1987, 1991, 1993, 1997, & most recently 04/2013 (2.75x38 mm Promus to mLAD, 3.0x28 mm Promus to pLAD, & staged 2.5x28 mm Promus LCx), HTN, HLD, & L renal neoplasm s/p nephrectomy (2006) presents with progressive angina over the preceding 2-3 weeks  # Chest pain - Though his recent Lexiscan & TTE were reassuring, his symptoms, troponin release, & subtle ECG findings are concerning for mild NSTEMI versus demand ischemia with hypertensive urgency.  His BP has been relatively elevated since arrival.  The benefits of a cardiac catheterization would have been be heavily weighed against the risk of compromising his single kidney with CKD.   - Monitor on telemetry with serial cardiac enzymes.  If his troponin does not further increase, a more conservative approach with acceleration of his antihypertensive regimen may be appropriate. - In the meantime, we will continue the heparin gtt, ASA, Atenolol, Clopidogrel, & Pitavastain.   - We  have additionally provided NTG past today.  As he has not previously tolerated Imdur, he may be a good Ranexa candidate.   - NPO in case any other workup is needed.  # Dyspnea - Though his BNP is not particularly elevated, he is mildly hypervolemic on physical exam with borderline evidence of pulmonary edema on his CXR.  He is not hypoxic.  There is no evidence to suggest PE.   - Will attempt a single dose of Furosemide 10 mg IV x 1 & monitor response. - Given his diastolic dysfunction & uncontrolled HTN, would likely benefit from HCTZ.    # Infrarenal AAA - This is unlikely to be contributing to his current presentation. - Will obtain an abdominal ultrasound for follow-up as this had been Dr. Evette Georges outpatient intention.  # h/o HTN - Hypertensive.  Have given NTG paste, Furosemide & PRN Hydralazine.   - Could consider scheduled HCTZ.   - Continue home Amlodipine, Atenolol, & Valsartan.  # h/o HLD - Adequately controlled per lipid panel 05/09/15. - Continue home Ezetimibe, Icosapent, & Pitavastatin.  # h/o CKD - Near baseline. - Continue to monitor.  # PPX - Heparin  # Full code  Signed, Alfonso Ramus, MD 05/29/2015, 1:34  AM     

## 2015-05-28 NOTE — ED Notes (Signed)
He c/o approximately 2 month history of  tightness in the middle of his chest, fatigue, sob. He had several test for this complaint this week ordered by his doctor but hasnt heard the results yet. Today he had to take 6 nitro for the pain, which would provide some relief but then pain would return. he is A&Ox4, breathing easily

## 2015-05-28 NOTE — ED Notes (Signed)
Positive troponin reported to Dr Vanita Panda.

## 2015-05-28 NOTE — Progress Notes (Signed)
ANTICOAGULATION CONSULT NOTE - Initial Consult  Pharmacy Consult for heparin Indication: chest pain/ACS  Allergies  Allergen Reactions  . Lovastatin Other (See Comments)    Elevated liver enzymes  . Morphine Hives  . Morphine And Related Hives  . Nifedipine Other (See Comments)    Elevated liver enzymes    Patient Measurements: Height: 5\' 8"  (172.7 cm) Weight: 192 lb (87.091 kg) IBW/kg (Calculated) : 68.4 Heparin Dosing Weight: 86 kg  Vital Signs: Temp: 97.9 F (36.6 C) (04/23 1806) Temp Source: Oral (04/23 1806) BP: 126/74 mmHg (04/23 1806) Pulse Rate: 75 (04/23 1806)  Labs:  Recent Labs  05/28/15 1856  HGB 14.2  HCT 43.5  PLT 213  CREATININE 1.32*    Estimated Creatinine Clearance: 46.3 mL/min (by C-G formula based on Cr of 1.32).  Assessment: 82 yo m presenting with chest pain. Took 6 nitro with no relief  PMH: HTN, CAD, HLD  AC: none pta. Heparin for r/o ACS  CV: r/o ACS - Trop 0.16. On plavix/asa at home  Renal: SCr 1.32  Heme: H&H 14.2/43.5, Plt 213  Goal of Therapy:  Heparin level 0.3-0.7 units/ml Monitor platelets by anticoagulation protocol: Yes   Plan:  Heparin bolus 4000 units Heparin infusion 1000 units/hr Daily HL, CBC Monitor for s/sx of bleeding F/U cards plans  Levester Fresh, PharmD, BCPS, [facility] Clinical Pharmacist Pager [phone] 05/28/2015 7:49 PM

## 2015-05-28 NOTE — ED Provider Notes (Signed)
CSN: [id number]     Arrival date & time 05/28/15  1757 History   First MD Initiated Contact with Patient 05/28/15 1817     Chief Complaint  Patient presents with  . Chest Pain     (Consider location/radiation/quality/duration/timing/severity/associated sxs/prior Treatment) HPI Patient presents with concern of chest pain. Patient is a long history of coronary disease, last catheterization, 2015. He notes that over the past 2 months, and in particular over the past 2 or 3 weeks he has had episodes of severe anterior chest pressure for Symptoms occur both randomly, and with exertion. Symptoms improved with nitroglycerin and rest. Patient has been working with his cardiologist, and has had stress test, echocardiogram within the past week. He does not know the results. Currently, the patient is pain-free, having had resolution of his most recent pain episode after taking nitroglycerin about 45 minutes ago. He notes ongoing generalized weakness, mild dyspnea, but no fever, cough, vomiting, diarrhea.  Past Medical History  Diagnosis Date  . Hypertension   . Personal history of colonic polyps   . Other abnormal blood chemistry   . Other nonspecific abnormal serum enzyme levels   . Coronary atherosclerosis of unspecified type of vessel, native or graft   . Impotence of organic origin   . Tobacco use disorder   . Pure hypercholesterolemia   . Internal hemorrhoids without mention of complication   . Myocardial infarction ([place]) 1976  . Unspecified congenital cystic kidney disease   . Calculus of kidney     "that's how they found the cancer" (04/27/2013)  . BPH (benign prostatic hyperplasia)     Followed by [name]/urology  . Melanoma ([place]) 06/2010    Left arm  . Malignant neoplasm of kidney, except pelvis   . 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   Past Surgical History  Procedure Laterality Date  . Nephrectomy Left 2006  . Melanoma excision Left ~ 2007     "arm"  . Colonoscopy  10/05/2008    single polyp, IH.  [name].  Repeat in 3 years.  . Carotid dopplers  03/08/2011    minimal plaque formation B. Symptoms: dizziness.  . Cardiolite  05/06/2011    low risk study; normal EF.  SE H&V.  Marland Kitchen Coronary angioplasty      "I've had 4" (04/27/2013)  . Coronary angioplasty with stent placement  04/2013; 04/27/2013    "2 + 1" (04/27/2013)  . Appendectomy  2014  . Eye surgery  11/04/2013    Cataract surgery B; [place].  Marland Kitchen Percutaneous coronary rotoblator intervention (pci-r) N/A 04/06/2013    Procedure: PERCUTANEOUS CORONARY ROTOBLATOR INTERVENTION (PCI-R);  Surgeon: [name], MD;  Location: [facility] CATH LAB;  Service: Cardiovascular;  Laterality: N/A;  . Percutaneous coronary stent intervention (pci-s) N/A 04/27/2013    Procedure: PERCUTANEOUS CORONARY STENT INTERVENTION (PCI-S);  Surgeon: [name], MD;  Location: [facility] CATH LAB;  Service: Cardiovascular;  Laterality: N/A;  . Melanoma excision  02/07/2015    R upper back. [facility]   Family History  Problem Relation Age of Onset  . Heart disease Mother     CAD, AAA.  Marland Kitchen AAA (abdominal aortic aneurysm) Mother    Social History  Substance Use Topics  . Smoking status: Former Smoker    Types: Pipe, Landscape architect  . Smokeless tobacco: Current User    Types: Chew     Comment: 04/27/2013 "quit smoking & chewing last year"  . Alcohol Use: 3.0 oz/week  5 Glasses of wine per week    Review of Systems  Constitutional:       Per HPI, otherwise negative  HENT:       Per HPI, otherwise negative  Respiratory:       Per HPI, otherwise negative  Cardiovascular:       Per HPI, otherwise negative  Gastrointestinal: Negative for vomiting.  Endocrine:       Negative aside from HPI  Genitourinary:       Neg aside from HPI   Musculoskeletal:       Per HPI, otherwise negative  Skin: Negative.   Neurological: Negative for syncope.      Allergies  Lovastatin; Morphine; Morphine and related; and Nifedipine  Home  Medications   Prior to Admission medications   Medication Sig Start Date End Date Taking? Authorizing Provider  amLODipine (NORVASC) 10 MG tablet Take 1 tablet (10 mg total) by mouth daily. 11/15/14   [name], MD  aspirin 81 MG tablet Take 81 mg by mouth daily.    Historical Provider, MD  atenolol (TENORMIN) 25 MG tablet Take 1/2 tablet daily 05/09/15   [name], MD  clopidogrel (PLAVIX) 75 MG tablet Take 1 tablet (75 mg total) by mouth daily. 04/05/15   [name], MD  ezetimibe (ZETIA) 10 MG tablet Take 1 tablet (10 mg total) by mouth daily. 12/22/14   [name], MD  finasteride (PROSCAR) 5 MG tablet Take 1 tablet (5 mg total) by mouth daily. 12/22/13   Wardell Honour, MD  Icosapent Ethyl 1 g CAPS Take 1 g by mouth 2 (two) times daily. 04/05/15   [name], MD  nitroGLYCERIN (NITROSTAT) 0.4 MG SL tablet Place 1 tablet (0.4 mg total) under the tongue every 5 (five) minutes as needed for chest pain. 08/04/14   [name], MD  Pitavastatin Calcium (LIVALO) 2 MG TABS Take 1 tablet (2 mg total) by mouth once. 03/24/14   [name], MD  tiZANidine (ZANAFLEX) 2 MG tablet TAKE 1 TABLET (2 MG TOTAL) BY MOUTH AT BEDTIME. 05/18/15   Wardell Honour, MD  valsartan (DIOVAN) 80 MG tablet Take 1 tablet (80 mg total) by mouth daily. 05/17/15   [name], MD   BP 126/74 mmHg  Pulse 75  Temp(Src) 97.9 F (36.6 C) (Oral)  Resp 18  Ht 5\' 8"  (1.727 m)  Wt 192 lb (87.091 kg)  BMI 29.20 kg/m2  SpO2 94% Physical Exam  Constitutional: He is oriented to person, place, and time. He appears well-developed. No distress.  HENT:  Head: Normocephalic and atraumatic.  Eyes: Conjunctivae and EOM are normal.  Cardiovascular: Normal rate and regular rhythm.   Pulmonary/Chest: Effort normal. No stridor. No respiratory distress.  Abdominal: He exhibits no distension. There is no tenderness.  Musculoskeletal: He exhibits no edema.  Neurological: He is alert and oriented to person, place,  and time.  Skin: Skin is warm and dry.  Psychiatric: He has a normal mood and affect.  Nursing note and vitals reviewed.   ED Course  Procedures (including critical care time) Labs Review Labs Reviewed  BASIC METABOLIC PANEL  [place], ED    Imaging Review Dg Chest 2 View  05/28/2015  CLINICAL DATA:  Chronic mid chest tightness, fatigue and shortness of breath. Initial encounter. EXAM: CHEST  2 VIEW COMPARISON:  Chest radiograph performed 04/20/2015 FINDINGS: The lungs are hypoexpanded. Chronic peribronchial thickening is noted. No pleural effusion  or pneumothorax is seen. The heart is normal in size; the mediastinal contour is within normal limits. No acute osseous abnormalities are seen. Clips are noted within the right upper quadrant, reflecting prior cholecystectomy. IMPRESSION: Lungs hypoexpanded. Chronic peribronchial thickening noted. Lungs otherwise grossly clear. Electronically Signed   By: Garald Balding M.D.   On: 05/28/2015 18:50   I have personally reviewed and evaluated these images and lab results as part of my medical decision-making.   EKG Interpretation None     EchocardiogramPatient:    Diontae, Zeller MR #:       [id number] Study Date: 05/25/2015 Gender:     M Age:        82 Height:     172.1 cm Weight:     87.6 kg BSA:        2.07 m^2 Pt. Status: Room:    ATTENDING    [name], M.D.  ORDERING     [name], M.D.  REFERRING    [name], M.D.  PERFORMING   [facility], Outpatient  SONOGRAPHER  Bethany McMahill, RDCS   cc:   ------------------------------------------------------------------- LV EF: 55% -   60%   ------------------------------------------------------------------- Indications:      CAD (I25.10).   ------------------------------------------------------------------- History:   PMH:  Abdominal Aortic Aneurysm.  Chest pain.  Dyspnea. Coronary artery disease.  PMH:   Myocardial infarction.  Risk factors:  Family history of  coronary artery disease. Former tobacco use. Hypertension. Dyslipidemia.   ------------------------------------------------------------------- Study Conclusions   - Left ventricle: The cavity size was normal. Wall thickness was   increased in a pattern of mild LVH. Systolic function was normal.   The estimated ejection fraction was in the range of 55% to 60%.   Regional wall motion abnormalities cannot be excluded. Doppler   parameters are consistent with abnormal left ventricular   relaxation (grade 1 diastolic dysfunction). - Mitral valve: Calcified annulus. - Left atrium: The atrium was mildly dilated.  Cardiac history from EMR He underwent numerous interventions dating back to [place], and in 1997. Cardiac catheterization in 2011 which showed preserved LV function with mild residual distal inferior apical hypocontractility. There is evidence for coronary calcification with segmental narrowing of his LAD of 30-40% proximally, 50% diffusely, in the midsegment, and 70-80% in the distal region, he had AV groove circumflex stenoses of 70 and 50% with a 40% OM 2 stenosis, and a 30-40 and 50% RCA stenoses with 80-90% stenosis in the acute marginal branch. He had been on medical therapy.   8:00 PM I was notified from the lab that the patient's troponin is elevated, 0.16.  On repeat exam the patient remains similar condition, still pain-free. Vital signs remain similar.  I discussed patient's case with cardiology fellow. Patient will start heparin drip. He'll be admitted to the cardiology team. MDM  Elderly male with a notable history of CAD, currently on Plavix, aspirin, presents with 2 weeks of intermittent chest pain concerning for unstable angina. Here, the patient's pain has resolved following nitroglycerin tablet. Patient's evaluation is most notable for elevated troponin. Patient was started on a heparin drip, admitted to the cardiology team.  CRITICAL CARE Performed by:  Carmin Muskrat Total critical care time: 35 minutes Critical care time was exclusive of separately billable procedures and treating other patients. Critical care was necessary to treat or prevent imminent or life-threatening deterioration. Critical care was time spent personally by me on the following activities: development of treatment plan with patient and/or surrogate as  well as nursing, discussions with consultants, evaluation of patient's response to treatment, examination of patient, obtaining history from patient or surrogate, ordering and performing treatments and interventions, ordering and review of laboratory studies, ordering and review of radiographic studies, pulse oximetry and re-evaluation of patient's condition.   Carmin Muskrat, MD 05/28/15 2003

## 2015-05-29 ENCOUNTER — Observation Stay (HOSPITAL_COMMUNITY): Payer: Medicare Other

## 2015-05-29 ENCOUNTER — Encounter (HOSPITAL_COMMUNITY): Payer: Self-pay | Admitting: Internal Medicine

## 2015-05-29 DIAGNOSIS — Z886 Allergy status to analgesic agent status: Secondary | ICD-10-CM | POA: Diagnosis not present

## 2015-05-29 DIAGNOSIS — N183 Chronic kidney disease, stage 3 unspecified: Secondary | ICD-10-CM | POA: Diagnosis present

## 2015-05-29 DIAGNOSIS — I252 Old myocardial infarction: Secondary | ICD-10-CM | POA: Diagnosis not present

## 2015-05-29 DIAGNOSIS — I25110 Atherosclerotic heart disease of native coronary artery with unstable angina pectoris: Secondary | ICD-10-CM | POA: Diagnosis present

## 2015-05-29 DIAGNOSIS — Z955 Presence of coronary angioplasty implant and graft: Secondary | ICD-10-CM | POA: Diagnosis not present

## 2015-05-29 DIAGNOSIS — I714 Abdominal aortic aneurysm, without rupture, unspecified: Secondary | ICD-10-CM | POA: Insufficient documentation

## 2015-05-29 DIAGNOSIS — I129 Hypertensive chronic kidney disease with stage 1 through stage 4 chronic kidney disease, or unspecified chronic kidney disease: Secondary | ICD-10-CM | POA: Diagnosis present

## 2015-05-29 DIAGNOSIS — Z905 Acquired absence of kidney: Secondary | ICD-10-CM

## 2015-05-29 DIAGNOSIS — Z85820 Personal history of malignant melanoma of skin: Secondary | ICD-10-CM | POA: Diagnosis not present

## 2015-05-29 DIAGNOSIS — I200 Unstable angina: Secondary | ICD-10-CM

## 2015-05-29 DIAGNOSIS — Z9842 Cataract extraction status, left eye: Secondary | ICD-10-CM | POA: Diagnosis not present

## 2015-05-29 DIAGNOSIS — Z7982 Long term (current) use of aspirin: Secondary | ICD-10-CM | POA: Diagnosis not present

## 2015-05-29 DIAGNOSIS — Z7902 Long term (current) use of antithrombotics/antiplatelets: Secondary | ICD-10-CM | POA: Diagnosis not present

## 2015-05-29 DIAGNOSIS — N289 Disorder of kidney and ureter, unspecified: Secondary | ICD-10-CM | POA: Diagnosis present

## 2015-05-29 DIAGNOSIS — N400 Enlarged prostate without lower urinary tract symptoms: Secondary | ICD-10-CM | POA: Diagnosis present

## 2015-05-29 DIAGNOSIS — I214 Non-ST elevation (NSTEMI) myocardial infarction: Secondary | ICD-10-CM | POA: Diagnosis present

## 2015-05-29 DIAGNOSIS — I2510 Atherosclerotic heart disease of native coronary artery without angina pectoris: Secondary | ICD-10-CM | POA: Diagnosis not present

## 2015-05-29 DIAGNOSIS — Z888 Allergy status to other drugs, medicaments and biological substances status: Secondary | ICD-10-CM | POA: Diagnosis not present

## 2015-05-29 DIAGNOSIS — Z85528 Personal history of other malignant neoplasm of kidney: Secondary | ICD-10-CM | POA: Diagnosis not present

## 2015-05-29 DIAGNOSIS — Z87891 Personal history of nicotine dependence: Secondary | ICD-10-CM | POA: Diagnosis not present

## 2015-05-29 DIAGNOSIS — Z9841 Cataract extraction status, right eye: Secondary | ICD-10-CM | POA: Diagnosis not present

## 2015-05-29 DIAGNOSIS — Z79899 Other long term (current) drug therapy: Secondary | ICD-10-CM | POA: Diagnosis not present

## 2015-05-29 DIAGNOSIS — E7800 Pure hypercholesterolemia, unspecified: Secondary | ICD-10-CM | POA: Diagnosis present

## 2015-05-29 LAB — COMPREHENSIVE METABOLIC PANEL
ALK PHOS: 41 U/L (ref 38–126)
ALT: 36 U/L (ref 17–63)
ANION GAP: 10 (ref 5–15)
AST: 33 U/L (ref 15–41)
Albumin: 3.4 g/dL — ABNORMAL LOW (ref 3.5–5.0)
BILIRUBIN TOTAL: 0.7 mg/dL (ref 0.3–1.2)
BUN: 12 mg/dL (ref 6–20)
CALCIUM: 8.7 mg/dL — AB (ref 8.9–10.3)
CO2: 25 mmol/L (ref 22–32)
CREATININE: 1.25 mg/dL — AB (ref 0.61–1.24)
Chloride: 105 mmol/L (ref 101–111)
GFR, EST NON AFRICAN AMERICAN: 52 mL/min — AB (ref >60)
Glucose, Bld: 113 mg/dL — ABNORMAL HIGH (ref 65–99)
Potassium: 3.6 mmol/L (ref 3.5–5.1)
SODIUM: 140 mmol/L (ref 135–145)
TOTAL PROTEIN: 6.2 g/dL — AB (ref 6.5–8.1)

## 2015-05-29 LAB — PROTIME-INR
INR: 1.1 (ref 0.00–1.49)
Prothrombin Time: 14.4 seconds (ref 11.6–15.2)

## 2015-05-29 LAB — HEPARIN LEVEL (UNFRACTIONATED): HEPARIN UNFRACTIONATED: 0.48 [IU]/mL (ref 0.30–0.70)

## 2015-05-29 LAB — CBC
HEMATOCRIT: 41.4 % (ref 39.0–52.0)
HEMOGLOBIN: 13.6 g/dL (ref 13.0–17.0)
MCH: 28.5 pg (ref 26.0–34.0)
MCHC: 32.9 g/dL (ref 30.0–36.0)
MCV: 86.6 fL (ref 78.0–100.0)
Platelets: 207 10*3/uL (ref 150–400)
RBC: 4.78 MIL/uL (ref 4.22–5.81)
RDW: 12.9 % (ref 11.5–15.5)
WBC: 5.8 10*3/uL (ref 4.0–10.5)

## 2015-05-29 LAB — TROPONIN I
TROPONIN I: 0.11 ng/mL — AB (ref <0.031)
Troponin I: 0.12 ng/mL — ABNORMAL HIGH (ref <0.031)
Troponin I: 0.1 ng/mL — ABNORMAL HIGH (ref <0.031)

## 2015-05-29 LAB — BRAIN NATRIURETIC PEPTIDE: B Natriuretic Peptide: 99.1 pg/mL (ref 0.0–100.0)

## 2015-05-29 LAB — PHOSPHORUS: PHOSPHORUS: 3.4 mg/dL (ref 2.5–4.6)

## 2015-05-29 LAB — MAGNESIUM: MAGNESIUM: 2 mg/dL (ref 1.7–2.4)

## 2015-05-29 MED ORDER — SODIUM CHLORIDE 0.9% FLUSH: 3.0000 mL | Freq: Two times a day (BID) | INTRAVENOUS | Status: DC

## 2015-05-29 MED ORDER — SODIUM CHLORIDE 0.9 % IV SOLN
INTRAVENOUS | Status: DC
  Administered 2015-05-29: 20:00:00 via INTRAVENOUS

## 2015-05-29 MED ORDER — SODIUM CHLORIDE 0.9% FLUSH: 3.0000 mL | INTRAVENOUS | Status: DC | PRN

## 2015-05-29 MED ORDER — SODIUM CHLORIDE 0.9 % IV SOLN
250.0000 mL | INTRAVENOUS | Status: DC | PRN
Start: 2015-05-29 — End: 2015-05-29

## 2015-05-29 MED ORDER — SODIUM CHLORIDE 0.9 % WEIGHT BASED INFUSION: 1.0000 mL/kg/h | INTRAVENOUS | Status: DC

## 2015-05-29 MED ORDER — SODIUM CHLORIDE 0.9 % IV SOLN: 250.0000 mL | INTRAVENOUS | Status: DC | PRN

## 2015-05-29 MED ORDER — HYDRALAZINE HCL 20 MG/ML IJ SOLN: 10.0000 mg | Freq: Four times a day (QID) | INTRAMUSCULAR | Status: DC | PRN

## 2015-05-29 MED ORDER — ATENOLOL 12.5 MG HALF TABLET
12.5000 mg | ORAL_TABLET | Freq: Every day | ORAL | Status: DC
  Administered 2015-05-29 – 2015-05-31 (×3): 12.5 mg via ORAL
  Filled 2015-05-29 (×3): qty 1

## 2015-05-29 NOTE — Progress Notes (Addendum)
Patient Name: Nicholas Douglas Date of Encounter: 05/29/2015  Hospital Problem List     Active Problems:   Essential hypertension, benign   Hyperlipidemia   Unstable angina (HCC)   Abdominal aortic aneurysm (AAA) (HCC)   NSTEMI (non-ST elevated myocardial infarction) ([place])   Chronic kidney disease, stage III (moderate)   AAA (abdominal aortic aneurysm) ([place])   Pt Profile:  82 yo M pt of Dr. [name] w a h/o CAD with numerous interventions dating back to 1987, 1991, 1993, 1997, & most recently 04/2013 (2.75x38 mm Promus to mLAD, 3.0x28 mm Promus to pLAD, & staged 2.5x28 mm Promus LCx), HTN, HLD, & L renal neoplasm s/p nephrectomy (2006) presents with progressive angina over the preceding 2-3 weeks, particularly in the preceding couple of days.  Subjective   Has felt well during the night. No episodes of chest pain/pressure or dyspnea.   Inpatient Medications    . amLODipine  10 mg Oral QHS  . aspirin EC  81 mg Oral Daily  . atenolol  12.5 mg Oral Daily  . clopidogrel  75 mg Oral Daily  . ezetimibe  10 mg Oral Daily  . finasteride  2.5 mg Oral Daily  . irbesartan  75 mg Oral Daily  . latanoprost  1 drop Both Eyes QHS  . nitroGLYCERIN  1 inch Topical Q6H  . omega-3 acid ethyl esters  1 g Oral BID  . sodium chloride flush  3 mL Intravenous Q12H  . sodium chloride flush  3 mL Intravenous Q12H    Vital Signs    Filed Vitals:   05/28/15 1900 05/28/15 2000 05/28/15 2053 05/29/15 0500  BP: 160/78 155/82 168/73 128/61  Pulse: 59 58 57 52  Temp:   97.4 F (36.3 C) 98.2 F (36.8 C)  TempSrc:   Oral   Resp: 16 18 18 19   Height:      Weight:   193 lb 6.4 oz (87.726 kg) 191 lb 11.2 oz (86.955 kg)  SpO2: 98% 97% 99% 95%    Intake/Output Summary (Last 24 hours) at 05/29/15 0803 Last data filed at 05/29/15 0636  Gross per 24 hour  Intake  91.67 ml  Output    900 ml  Net -808.33 ml   Filed Weights   05/28/15 1806 05/28/15 2053 05/29/15 0500  Weight: 192 lb (87.091 kg) 193 lb  6.4 oz (87.726 kg) 191 lb 11.2 oz (86.955 kg)    Physical Exam    General: Pleasant older male, NAD. Neuro: Alert and oriented X 3. Moves all extremities spontaneously. Psych: Normal affect. HEENT:  Normal  Neck: Supple without bruits or JVD. Lungs:  Resp regular and unlabored, CTA. Heart: RRR no s3, s4, or murmurs. Abdomen: Soft, non-tender, non-distended, BS + x 4.  Extremities: No clubbing, cyanosis or edema. DP/PT/Radials 2+ and equal bilaterally.  Labs    CBC  Recent Labs  05/28/15 1856 05/29/15 0417  WBC 5.8 5.8  HGB 14.2 13.6  HCT 43.5 41.4  MCV 87.0 86.6  PLT 213 [id number]   Basic Metabolic Panel  Recent Labs  05/28/15 1856 05/29/15 0417  NA 140 140  K 4.6 3.6  CL 103 105  CO2 28 25  GLUCOSE 127* 113*  BUN 14 12  CREATININE 1.32* 1.25*  CALCIUM 9.2 8.7*  MG  --  2.0  PHOS  --  3.4   Liver Function Tests  Recent Labs  05/29/15 0417  AST 33  ALT 36  ALKPHOS 41  BILITOT 0.7  PROT 6.2*  ALBUMIN 3.4*   Cardiac Enzymes  Recent Labs  05/28/15 2330 05/29/15 0417  TROPONINI 0.11* 0.12*    Telemetry    SR Rate- 54  ECG    No morning EKG  Radiology    Dg Chest 2 View  05/28/2015  CLINICAL DATA:  Chronic mid chest tightness, fatigue and shortness of breath. Initial encounter. EXAM: CHEST  2 VIEW COMPARISON:  Chest radiograph performed 04/20/2015 FINDINGS: The lungs are hypoexpanded. Chronic peribronchial thickening is noted. No pleural effusion or pneumothorax is seen. The heart is normal in size; the mediastinal contour is within normal limits. No acute osseous abnormalities are seen. Clips are noted within the right upper quadrant, reflecting prior cholecystectomy. IMPRESSION: Lungs hypoexpanded. Chronic peribronchial thickening noted. Lungs otherwise grossly clear. Electronically Signed   By: Garald Balding M.D.   On: 05/28/2015 18:50    Assessment & Plan    1. Chest pain/Unstable angina - Though his recent Lexiscan & TTE were reassuring,  his symptoms, troponin release, & subtle ECG findings are concerning for mild NSTEMI versus demand ischemia with hypertensive urgency.  - Troponin has remained relatively the same from 0.11>>0.12, denies any chest pain/pressure or dyspnea during the night.    - In the setting of his CKD with only one kidney, there is concern related to worsening renal function, Cr is stable at 1.25 after receiving 10mg  IV dose of Lasix, with good UOP.  - has remained NPO,will continue the heparin gtt, ASA, Atenolol, Clopidogrel, & Pravastain, and discuss with Dr. Ellyn Hack regarding treatment plan.  - continue nitroglycerin paste  2. Dyspnea - BNP is not elevated, He is not hypoxic. There is no evidence to suggest PE.  - Given his diastolic dysfunction & uncontrolled HTN, would likely benefit from HCTZ.   3. Infrarenal AAA - This is unlikely to be contributing to his current presentation. - abdominal ultrasound is pending, denies any pain.  4.  HTN - Hypertensive yesterday with medication adjustments made. On NTG paste,& PRN Hydralazine. 10mg  Lasix given once yesterday, Cr is stable with good UOP  - Continue home Amlodipine, Atenolol, & Valsartan.  5. HLD - Adequately controlled per lipid panel 05/09/15. - Continue home Ezetimibe, Icosapent, & Pitavastatin.  6.  CKD/ one kidney - Near baseline. - Continue to monitor.  Signed, Reino Bellis NP-C Pager [phone]  I have seen, examined and evaluated the patient this AM along with Ms. Mancel Bale, NP-C On morning rounds.  After reviewing all the available data and chart,  I agree with her findings, examination as well as impression recommendations.  Patient with known coronary disease as was PCI multiple interventions in the past who now presents with symptoms concerning for unstable angina with minimal troponin elevation. This is despite the fact that he had a negative Myoview just last week.tthe fact that he has persistent symptoms despite  non-invasive evaluation, I agree the best option is to proceed with coronary angiography with possible PCI.  He will need rehydration for cardiac catheter tomorrow. -- as his troponin levels were elevated, we will continue IV heparin overnight as well along with nitroglycerin paste Otherwise continue his aspirin, Plavix, beta blocker and statin.  He will be scheduled for second case catheterization tomorrow with Dr. [name]. Scheduling conflicts with  Too many cases today allows [place] the ability to pre-hydrate him overnight Consent Attestation Signed  Performing MD:  [name], MD  Procedure:  Left heart catheterization with coronary angiography and possible coronary interv  The procedure  with Risks/Benefits/Alternatives please discussed in detail with the patient and his wife..  All questions were answered.    Risks / Complications include, but not limited to: Death, MI, CVA/TIA, VF/VT (with defibrillation), Bradycardia (need for temporary pacer placement), contrast induced nephropathy, bleeding / bruising / hematoma / pseudoaneurysm, vascular or coronary injury (with possible emergent CT or Vascular Surgery), adverse medication reactions, infection.  Additional risks involving the use of radiation with the possibility of radiation burns and cancer were explained in detail.  The patient (and family) voice understanding and agree to proceed.       Leonie Man, M.D., M.S. Interventional Cardiologist   Pager # [phone] Phone # [phone] [address] [place], [place] [address]

## 2015-05-29 NOTE — Progress Notes (Addendum)
ANTICOAGULATION CONSULT NOTE - Follow-up Consult  Pharmacy Consult for heparin Indication: chest pain/ACS  Allergies  Allergen Reactions  . Imdur [Isosorbide Dinitrate]   . Lovastatin Other (See Comments)    Elevated liver enzymes  . Morphine Hives  . Morphine And Related Hives  . Nifedipine Other (See Comments)    Elevated liver enzymes    Patient Measurements: Height: 5\' 8"  (172.7 cm) Weight: 193 lb 6.4 oz (87.726 kg) IBW/kg (Calculated) : 68.4 Heparin Dosing Weight: 86 kg  Vital Signs: Temp: 97.4 F (36.3 C) (04/23 2053) Temp Source: Oral (04/23 2053) BP: 168/73 mmHg (04/23 2053) Pulse Rate: 57 (04/23 2053)  Labs:  Recent Labs  05/28/15 1856 05/28/15 2330 05/29/15 0417  HGB 14.2  --  13.6  HCT 43.5  --  41.4  PLT 213  --  207  LABPROT  --   --  14.4  INR  --   --  1.10  HEPARINUNFRC  --   --  0.48  CREATININE 1.32*  --   --   TROPONINI  --  0.11*  --     Estimated Creatinine Clearance: 46.4 mL/min (by C-G formula based on Cr of 1.32).  Assessment: 82 yo M on heparin for r/o ACS. Heparin level therapeutic on 1000 units/hr. CBC stable, no bleeding noted.  Goal of Therapy:  Heparin level 0.3-0.7 units/ml Monitor platelets by anticoagulation protocol: Yes   Plan:  Heparin to 1100 units / hr to prevent decrease to less than 0.3 Follow up AM labs  Thank you Anette Guarneri, PharmD [phone]

## 2015-05-29 NOTE — Care Management Obs Status (Signed)
[facility] NOTIFICATION   Patient Details  Name: Nicholas Douglas MRN: [id number] Date of Birth: [birth year]   Medicare Observation Status Notification Given:  Yes (chest Pain)    Bethena Roys, RN 05/29/2015, 12:20 PM

## 2015-05-30 ENCOUNTER — Encounter (HOSPITAL_COMMUNITY): Payer: Self-pay | Admitting: *Deleted

## 2015-05-30 ENCOUNTER — Encounter (HOSPITAL_COMMUNITY)
Admission: EM | Disposition: A | Discharge status: Home / Self Care | Payer: Self-pay | Source: Home / Self Care | Attending: Cardiovascular Disease

## 2015-05-30 HISTORY — PX: CARDIAC CATHETERIZATION: SHX172

## 2015-05-30 LAB — CBC
HCT: 42.7 % (ref 39.0–52.0)
Hemoglobin: 14.2 g/dL (ref 13.0–17.0)
MCH: 28.7 pg (ref 26.0–34.0)
MCHC: 33.3 g/dL (ref 30.0–36.0)
MCV: 86.3 fL (ref 78.0–100.0)
Platelets: 201 K/uL (ref 150–400)
RBC: 4.95 MIL/uL (ref 4.22–5.81)
RDW: 12.9 % (ref 11.5–15.5)
WBC: 5.7 K/uL (ref 4.0–10.5)

## 2015-05-30 LAB — BASIC METABOLIC PANEL
ANION GAP: 11 (ref 5–15)
BUN: 12 mg/dL (ref 6–20)
CO2: 23 mmol/L (ref 22–32)
Calcium: 8.9 mg/dL (ref 8.9–10.3)
Chloride: 104 mmol/L (ref 101–111)
Creatinine, Ser: 1.26 mg/dL — ABNORMAL HIGH (ref 0.61–1.24)
GFR calc Af Amer: 60 mL/min — ABNORMAL LOW (ref >60)
GFR, EST NON AFRICAN AMERICAN: 51 mL/min — AB (ref >60)
GLUCOSE: 100 mg/dL — AB (ref 65–99)
POTASSIUM: 4.5 mmol/L (ref 3.5–5.1)
Sodium: 138 mmol/L (ref 135–145)

## 2015-05-30 LAB — POCT ACTIVATED CLOTTING TIME: Activated Clotting Time: 497 seconds

## 2015-05-30 LAB — HEPARIN LEVEL (UNFRACTIONATED): Heparin Unfractionated: 0.72 [IU]/mL — ABNORMAL HIGH (ref 0.30–0.70)

## 2015-05-30 SURGERY — LEFT HEART CATH AND CORONARY ANGIOGRAPHY

## 2015-05-30 MED ORDER — HEPARIN (PORCINE) IN NACL 2-0.9 UNIT/ML-% IJ SOLN: INTRAMUSCULAR | Status: DC | PRN

## 2015-05-30 MED ORDER — BIVALIRUDIN 250 MG IV SOLR
INTRAVENOUS | Status: AC
  Filled 2015-05-30: qty 250

## 2015-05-30 MED ORDER — IOPAMIDOL (ISOVUE-370) INJECTION 76%
INTRAVENOUS | Status: DC | PRN
  Administered 2015-05-30: 240 mL via INTRAVENOUS

## 2015-05-30 MED ORDER — CLOPIDOGREL BISULFATE 75 MG PO TABS
ORAL_TABLET | ORAL | Status: AC
  Filled 2015-05-30: qty 1

## 2015-05-30 MED ORDER — VERAPAMIL HCL 2.5 MG/ML IV SOLN
INTRA_ARTERIAL | Status: DC | PRN
  Administered 2015-05-30: 15 mL via INTRA_ARTERIAL

## 2015-05-30 MED ORDER — HEART ATTACK BOUNCING BOOK
Freq: Once | Status: AC
  Administered 2015-05-30: 21:00:00
  Filled 2015-05-30: qty 1

## 2015-05-30 MED ORDER — NITROGLYCERIN 1 MG/10 ML FOR IR/CATH LAB
INTRA_ARTERIAL | Status: AC
  Filled 2015-05-30: qty 10

## 2015-05-30 MED ORDER — SODIUM CHLORIDE 0.9% FLUSH: 3.0000 mL | INTRAVENOUS | Status: DC | PRN

## 2015-05-30 MED ORDER — DIAZEPAM 5 MG PO TABS: 5.0000 mg | ORAL_TABLET | Freq: Four times a day (QID) | ORAL | Status: DC | PRN

## 2015-05-30 MED ORDER — ASPIRIN EC 81 MG PO TBEC
81.0000 mg | DELAYED_RELEASE_TABLET | Freq: Every day | ORAL | Status: DC
  Administered 2015-05-31: 81 mg via ORAL
  Filled 2015-05-30: qty 1

## 2015-05-30 MED ORDER — NITROGLYCERIN IN D5W 200-5 MCG/ML-% IV SOLN: 0.0000 ug/min | INTRAVENOUS | Status: DC

## 2015-05-30 MED ORDER — ANGIOPLASTY BOOK
Freq: Once | Status: AC
  Administered 2015-05-30: 21:00:00
  Filled 2015-05-30: qty 1

## 2015-05-30 MED ORDER — SODIUM CHLORIDE 0.9% FLUSH
3.0000 mL | Freq: Two times a day (BID) | INTRAVENOUS | Status: DC
  Administered 2015-05-30: 17:00:00 3 mL via INTRAVENOUS

## 2015-05-30 MED ORDER — HEPARIN (PORCINE) IN NACL 2-0.9 UNIT/ML-% IJ SOLN
INTRAMUSCULAR | Status: AC
  Filled 2015-05-30: qty 1000

## 2015-05-30 MED ORDER — SODIUM CHLORIDE 0.9 % IV SOLN
INTRAVENOUS | Status: DC
  Administered 2015-05-30: 14:00:00 via INTRAVENOUS

## 2015-05-30 MED ORDER — VERAPAMIL HCL 2.5 MG/ML IV SOLN
INTRAVENOUS | Status: AC
  Filled 2015-05-30: qty 2

## 2015-05-30 MED ORDER — CLOPIDOGREL BISULFATE 75 MG PO TABS
ORAL_TABLET | ORAL | Status: DC | PRN
  Administered 2015-05-30: 150 mg via ORAL

## 2015-05-30 MED ORDER — LIDOCAINE HCL (PF) 1 % IJ SOLN
INTRAMUSCULAR | Status: DC | PRN
  Administered 2015-05-30: 2 mL

## 2015-05-30 MED ORDER — FENTANYL CITRATE (PF) 100 MCG/2ML IJ SOLN
INTRAMUSCULAR | Status: AC
  Filled 2015-05-30: qty 2

## 2015-05-30 MED ORDER — SODIUM CHLORIDE 0.9 % IV SOLN
250.0000 mg | INTRAVENOUS | Status: DC | PRN
  Administered 2015-05-30 (×2): 1.75 mg/kg/h via INTRAVENOUS

## 2015-05-30 MED ORDER — ONDANSETRON HCL 4 MG/2ML IJ SOLN: 4.0000 mg | Freq: Four times a day (QID) | INTRAMUSCULAR | Status: DC | PRN

## 2015-05-30 MED ORDER — IOPAMIDOL (ISOVUE-370) INJECTION 76%
INTRAVENOUS | Status: AC
  Filled 2015-05-30: qty 50

## 2015-05-30 MED ORDER — MIDAZOLAM HCL 2 MG/2ML IJ SOLN
INTRAMUSCULAR | Status: AC
  Filled 2015-05-30: qty 2

## 2015-05-30 MED ORDER — CLOPIDOGREL BISULFATE 75 MG PO TABS
75.0000 mg | ORAL_TABLET | Freq: Every day | ORAL | Status: DC
  Administered 2015-05-31: 10:00:00 75 mg via ORAL
  Filled 2015-05-30: qty 1

## 2015-05-30 MED ORDER — HEPARIN (PORCINE) IN NACL 2-0.9 UNIT/ML-% IJ SOLN
INTRAMUSCULAR | Status: DC | PRN
  Administered 2015-05-30: 1500 mL

## 2015-05-30 MED ORDER — MIDAZOLAM HCL 2 MG/2ML IJ SOLN
INTRAMUSCULAR | Status: DC | PRN
  Administered 2015-05-30 (×3): 1 mg via INTRAVENOUS

## 2015-05-30 MED ORDER — NITROGLYCERIN IN D5W 200-5 MCG/ML-% IV SOLN
INTRAVENOUS | Status: AC
  Filled 2015-05-30: qty 250

## 2015-05-30 MED ORDER — HEPARIN SODIUM (PORCINE) 1000 UNIT/ML IJ SOLN
INTRAMUSCULAR | Status: DC | PRN
  Administered 2015-05-30: 4500 [IU] via INTRAVENOUS

## 2015-05-30 MED ORDER — ACETAMINOPHEN 325 MG PO TABS: 650.0000 mg | ORAL_TABLET | ORAL | Status: DC | PRN

## 2015-05-30 MED ORDER — NITROGLYCERIN IN D5W 200-5 MCG/ML-% IV SOLN
INTRAVENOUS | Status: DC | PRN
  Administered 2015-05-30: 10 ug/min via INTRAVENOUS

## 2015-05-30 MED ORDER — FENTANYL CITRATE (PF) 100 MCG/2ML IJ SOLN
INTRAMUSCULAR | Status: DC | PRN
  Administered 2015-05-30 (×2): 25 ug via INTRAVENOUS

## 2015-05-30 MED ORDER — BIVALIRUDIN BOLUS VIA INFUSION - CUPID
INTRAVENOUS | Status: DC | PRN
  Administered 2015-05-30: 65.475 mg via INTRAVENOUS

## 2015-05-30 MED ORDER — NITROGLYCERIN 1 MG/10 ML FOR IR/CATH LAB
INTRA_ARTERIAL | Status: DC | PRN
  Administered 2015-05-30: 150 ug
  Administered 2015-05-30 (×3): 200 ug
  Administered 2015-05-30: 100 ug

## 2015-05-30 MED ORDER — IOPAMIDOL (ISOVUE-370) INJECTION 76%
INTRAVENOUS | Status: AC
  Filled 2015-05-30: qty 100

## 2015-05-30 MED ORDER — LIDOCAINE HCL (PF) 1 % IJ SOLN
INTRAMUSCULAR | Status: AC
  Filled 2015-05-30: qty 30

## 2015-05-30 MED ORDER — SODIUM CHLORIDE 0.9 % IV SOLN: 250.0000 mL | INTRAVENOUS | Status: DC | PRN

## 2015-05-30 SURGICAL SUPPLY — 24 items
BALLN EMERGE MR 2.0X12 (BALLOONS) ×3
BALLN EMERGE MR 2.25X12 (BALLOONS) ×3
BALLOON EMERGE MR 2.0X12 (BALLOONS) ×1 IMPLANT
BALLOON EMERGE MR 2.25X12 (BALLOONS) ×1 IMPLANT
CATH INFINITI 5 FR JL3.5 (CATHETERS) ×3 IMPLANT
CATH INFINITI 5FR ANG PIGTAIL (CATHETERS) ×3 IMPLANT
CATH INFINITI 5FR JL4 (CATHETERS) ×3 IMPLANT
CATH INFINITI JR4 5F (CATHETERS) ×3 IMPLANT
CATH VISTA GUIDE 6FR XBRCA (CATHETERS) ×3 IMPLANT
DEVICE RAD COMP TR BAND LRG (VASCULAR PRODUCTS) ×3 IMPLANT
GLIDESHEATH SLEND SS 6F .021 (SHEATH) ×3 IMPLANT
GUIDE CATH RUNWAY 6FR FR4 (CATHETERS) ×3 IMPLANT
GUIDELINER 6F (CATHETERS) ×3 IMPLANT
KIT ENCORE 26 ADVANTAGE (KITS) ×3 IMPLANT
KIT HEART LEFT (KITS) ×3 IMPLANT
PACK CARDIAC CATHETERIZATION (CUSTOM PROCEDURE TRAY) ×3 IMPLANT
SYR MEDRAD MARK V 150ML (SYRINGE) IMPLANT
TRANSDUCER W/STOPCOCK (MISCELLANEOUS) ×3 IMPLANT
TUBING CIL FLEX 10 FLL-RA (TUBING) ×3 IMPLANT
WIRE COUGAR XT STRL 190CM (WIRE) ×3 IMPLANT
WIRE HI TORQ VERSACORE-J 145CM (WIRE) ×3 IMPLANT
WIRE LUGE 182CM (WIRE) ×3 IMPLANT
WIRE PT2 MS 185 (WIRE) ×3 IMPLANT
WIRE SAFE-T 1.5MM-J .035X260CM (WIRE) ×3 IMPLANT

## 2015-05-30 NOTE — Progress Notes (Signed)
TR BAND REMOVAL  LOCATION:    right radial  DEFLATED PER PROTOCOL:    Yes.    TIME BAND OFF / DRESSING APPLIED:    1700   SITE UPON ARRIVAL:    Level 0  SITE AFTER BAND REMOVAL:    Level 1  CIRCULATION SENSATION AND MOVEMENT:    Within Normal Limits   Yes.    COMMENTS:   Tolerated procedure well, good cap refill

## 2015-05-30 NOTE — Progress Notes (Signed)
ANTICOAGULATION CONSULT NOTE - Follow-up Consult  Pharmacy Consult for heparin Indication: chest pain/ACS  Allergies  Allergen Reactions  . Imdur [Isosorbide Dinitrate]   . Lovastatin Other (See Comments)    Elevated liver enzymes  . Morphine Hives  . Morphine And Related Hives  . Nifedipine Other (See Comments)    Elevated liver enzymes    Patient Measurements: Height: 5\' 8"  (172.7 cm) Weight: 192 lb 6.4 oz (87.272 kg) IBW/kg (Calculated) : 68.4 Heparin Dosing Weight: 86 kg  Vital Signs: Temp: 97.6 F (36.4 C) (04/25 0429) BP: 133/70 mmHg (04/25 0429) Pulse Rate: 56 (04/25 0429)  Labs:  Recent Labs  05/28/15 1856 05/28/15 2330 05/29/15 0417 05/29/15 1038 05/30/15 0602  HGB 14.2  --  13.6  --  14.2  HCT 43.5  --  41.4  --  42.7  PLT 213  --  207  --  201  LABPROT  --   --  14.4  --   --   INR  --   --  1.10  --   --   HEPARINUNFRC  --   --  0.48  --  0.72*  CREATININE 1.32*  --  1.25*  --   --   TROPONINI  --  0.11* 0.12* 0.10*  --     Estimated Creatinine Clearance: 49 mL/min (by C-G formula based on Cr of 1.25).  Assessment: 82 yo M on heparin for r/o ACS. Heparin level slightly supratherapeutic on 1100 units/hr. CBC stable, no bleeding noted. No issues with line or bleeding reported per RN.  Goal of Therapy:  Heparin level 0.3-0.7 units/ml Monitor platelets by anticoagulation protocol: Yes   Plan:  Decrease heparin to 1000 units/hr F/u 8 hr heparin level  Sherlon Handing, PharmD, BCPS Clinical pharmacist, pager [phone] 05/30/2015 7:15 AM

## 2015-05-30 NOTE — Interval H&P Note (Signed)
Cath Lab Visit (complete for each Cath Lab visit)  Clinical Evaluation Leading to the Procedure:   ACS: Yes.    Non-ACS:    Anginal Classification: CCS IV  Anti-ischemic medical therapy: Maximal Therapy (2 or more classes of medications)  Non-Invasive Test Results: Low-risk stress test findings: cardiac mortality <1%/year  Prior CABG: No previous CABG      History and Physical Interval Note:  05/30/2015 9:21 AM  Nicholas Douglas  has presented today for surgery, with the diagnosis of unstable angina  The various methods of treatment have been discussed with the patient and family. After consideration of risks, benefits and other options for treatment, the patient has consented to  Procedure(s): Left Heart Cath and Coronary Angiography (N/Douglas) as Douglas surgical intervention .  The patient's history has been reviewed, patient examined, no change in status, stable for surgery.  I have reviewed the patient's chart and labs.  Questions were answered to the patient's satisfaction.     Nicholas Douglas

## 2015-05-30 NOTE — H&P (View-Only) (Signed)
Patient Name: Nicholas Douglas Date of Encounter: 05/29/2015  Hospital Problem List     Active Problems:   Essential hypertension, benign   Hyperlipidemia   Unstable angina (HCC)   Abdominal aortic aneurysm (AAA) (HCC)   NSTEMI (non-ST elevated myocardial infarction) ([place])   Chronic kidney disease, stage III (moderate)   AAA (abdominal aortic aneurysm) ([place])   Pt Profile:  82 yo M pt of Dr. [name] w a h/o CAD with numerous interventions dating back to 1987, 1991, 1993, 1997, & most recently 04/2013 (2.75x38 mm Promus to mLAD, 3.0x28 mm Promus to pLAD, & staged 2.5x28 mm Promus LCx), HTN, HLD, & L renal neoplasm s/p nephrectomy (2006) presents with progressive angina over the preceding 2-3 weeks, particularly in the preceding couple of days.  Subjective   Has felt well during the night. No episodes of chest pain/pressure or dyspnea.   Inpatient Medications    . amLODipine  10 mg Oral QHS  . aspirin EC  81 mg Oral Daily  . atenolol  12.5 mg Oral Daily  . clopidogrel  75 mg Oral Daily  . ezetimibe  10 mg Oral Daily  . finasteride  2.5 mg Oral Daily  . irbesartan  75 mg Oral Daily  . latanoprost  1 drop Both Eyes QHS  . nitroGLYCERIN  1 inch Topical Q6H  . omega-3 acid ethyl esters  1 g Oral BID  . sodium chloride flush  3 mL Intravenous Q12H  . sodium chloride flush  3 mL Intravenous Q12H    Vital Signs    Filed Vitals:   05/28/15 1900 05/28/15 2000 05/28/15 2053 05/29/15 0500  BP: 160/78 155/82 168/73 128/61  Pulse: 59 58 57 52  Temp:   97.4 F (36.3 C) 98.2 F (36.8 C)  TempSrc:   Oral   Resp: 16 18 18 19   Height:      Weight:   193 lb 6.4 oz (87.726 kg) 191 lb 11.2 oz (86.955 kg)  SpO2: 98% 97% 99% 95%    Intake/Output Summary (Last 24 hours) at 05/29/15 0803 Last data filed at 05/29/15 0636  Gross per 24 hour  Intake  91.67 ml  Output    900 ml  Net -808.33 ml   Filed Weights   05/28/15 1806 05/28/15 2053 05/29/15 0500  Weight: 192 lb (87.091 kg) 193 lb  6.4 oz (87.726 kg) 191 lb 11.2 oz (86.955 kg)    Physical Exam    General: Pleasant older male, NAD. Neuro: Alert and oriented X 3. Moves all extremities spontaneously. Psych: Normal affect. HEENT:  Normal  Neck: Supple without bruits or JVD. Lungs:  Resp regular and unlabored, CTA. Heart: RRR no s3, s4, or murmurs. Abdomen: Soft, non-tender, non-distended, BS + x 4.  Extremities: No clubbing, cyanosis or edema. DP/PT/Radials 2+ and equal bilaterally.  Labs    CBC  Recent Labs  05/28/15 1856 05/29/15 0417  WBC 5.8 5.8  HGB 14.2 13.6  HCT 43.5 41.4  MCV 87.0 86.6  PLT 213 [id number]   Basic Metabolic Panel  Recent Labs  05/28/15 1856 05/29/15 0417  NA 140 140  K 4.6 3.6  CL 103 105  CO2 28 25  GLUCOSE 127* 113*  BUN 14 12  CREATININE 1.32* 1.25*  CALCIUM 9.2 8.7*  MG  --  2.0  PHOS  --  3.4   Liver Function Tests  Recent Labs  05/29/15 0417  AST 33  ALT 36  ALKPHOS 41  BILITOT 0.7  PROT 6.2*  ALBUMIN 3.4*   Cardiac Enzymes  Recent Labs  05/28/15 2330 05/29/15 0417  TROPONINI 0.11* 0.12*    Telemetry    SR Rate- 54  ECG    No morning EKG  Radiology    Dg Chest 2 View  05/28/2015  CLINICAL DATA:  Chronic mid chest tightness, fatigue and shortness of breath. Initial encounter. EXAM: CHEST  2 VIEW COMPARISON:  Chest radiograph performed 04/20/2015 FINDINGS: The lungs are hypoexpanded. Chronic peribronchial thickening is noted. No pleural effusion or pneumothorax is seen. The heart is normal in size; the mediastinal contour is within normal limits. No acute osseous abnormalities are seen. Clips are noted within the right upper quadrant, reflecting prior cholecystectomy. IMPRESSION: Lungs hypoexpanded. Chronic peribronchial thickening noted. Lungs otherwise grossly clear. Electronically Signed   By: Garald Balding M.D.   On: 05/28/2015 18:50    Assessment & Plan    1. Chest pain/Unstable angina - Though his recent Lexiscan & TTE were reassuring,  his symptoms, troponin release, & subtle ECG findings are concerning for mild NSTEMI versus demand ischemia with hypertensive urgency.  - Troponin has remained relatively the same from 0.11>>0.12, denies any chest pain/pressure or dyspnea during the night.    - In the setting of his CKD with only one kidney, there is concern related to worsening renal function, Cr is stable at 1.25 after receiving 10mg  IV dose of Lasix, with good UOP.  - has remained NPO,will continue the heparin gtt, ASA, Atenolol, Clopidogrel, & Pravastain, and discuss with Dr. Ellyn Hack regarding treatment plan.  - continue nitroglycerin paste  2. Dyspnea - BNP is not elevated, He is not hypoxic. There is no evidence to suggest PE.  - Given his diastolic dysfunction & uncontrolled HTN, would likely benefit from HCTZ.   3. Infrarenal AAA - This is unlikely to be contributing to his current presentation. - abdominal ultrasound is pending, denies any pain.  4.  HTN - Hypertensive yesterday with medication adjustments made. On NTG paste,& PRN Hydralazine. 10mg  Lasix given once yesterday, Cr is stable with good UOP  - Continue home Amlodipine, Atenolol, & Valsartan.  5. HLD - Adequately controlled per lipid panel 05/09/15. - Continue home Ezetimibe, Icosapent, & Pitavastatin.  6.  CKD/ one kidney - Near baseline. - Continue to monitor.  Signed, Reino Bellis NP-C Pager [phone]  I have seen, examined and evaluated the patient this AM along with Ms. Mancel Bale, NP-C On morning rounds.  After reviewing all the available data and chart,  I agree with her findings, examination as well as impression recommendations.  Patient with known coronary disease as was PCI multiple interventions in the past who now presents with symptoms concerning for unstable angina with minimal troponin elevation. This is despite the fact that he had a negative Myoview just last week.tthe fact that he has persistent symptoms despite  non-invasive evaluation, I agree the best option is to proceed with coronary angiography with possible PCI.  He will need rehydration for cardiac catheter tomorrow. -- as his troponin levels were elevated, we will continue IV heparin overnight as well along with nitroglycerin paste Otherwise continue his aspirin, Plavix, beta blocker and statin.  He will be scheduled for second case catheterization tomorrow with Dr. [name]. Scheduling conflicts with  Too many cases today allows [place] the ability to pre-hydrate him overnight Consent Attestation Signed  Performing MD:  [name], MD  Procedure:  Left heart catheterization with coronary angiography and possible coronary interv  The procedure  with Risks/Benefits/Alternatives please discussed in detail with the patient and his wife..  All questions were answered.    Risks / Complications include, but not limited to: Death, MI, CVA/TIA, VF/VT (with defibrillation), Bradycardia (need for temporary pacer placement), contrast induced nephropathy, bleeding / bruising / hematoma / pseudoaneurysm, vascular or coronary injury (with possible emergent CT or Vascular Surgery), adverse medication reactions, infection.  Additional risks involving the use of radiation with the possibility of radiation burns and cancer were explained in detail.  The patient (and family) voice understanding and agree to proceed.       Leonie Man, M.D., M.S. Interventional Cardiologist   Pager # [phone] Phone # [phone] [address]. [place], [place] [address]

## 2015-05-31 ENCOUNTER — Inpatient Hospital Stay (HOSPITAL_COMMUNITY): Payer: Medicare Other

## 2015-05-31 ENCOUNTER — Encounter (HOSPITAL_COMMUNITY): Payer: Self-pay | Admitting: Cardiology

## 2015-05-31 DIAGNOSIS — Z9861 Coronary angioplasty status: Secondary | ICD-10-CM

## 2015-05-31 DIAGNOSIS — Z955 Presence of coronary angioplasty implant and graft: Secondary | ICD-10-CM | POA: Insufficient documentation

## 2015-05-31 DIAGNOSIS — I2510 Atherosclerotic heart disease of native coronary artery without angina pectoris: Secondary | ICD-10-CM

## 2015-05-31 HISTORY — DX: Atherosclerotic heart disease of native coronary artery without angina pectoris: I25.10

## 2015-05-31 LAB — CBC
HCT: 41.6 % (ref 39.0–52.0)
Hemoglobin: 14.1 g/dL (ref 13.0–17.0)
MCH: 29.4 pg (ref 26.0–34.0)
MCHC: 33.9 g/dL (ref 30.0–36.0)
MCV: 86.8 fL (ref 78.0–100.0)
PLATELETS: 194 10*3/uL (ref 150–400)
RBC: 4.79 MIL/uL (ref 4.22–5.81)
RDW: 13 % (ref 11.5–15.5)
WBC: 6.5 10*3/uL (ref 4.0–10.5)

## 2015-05-31 LAB — BASIC METABOLIC PANEL
Anion gap: 10 (ref 5–15)
BUN: 9 mg/dL (ref 6–20)
CALCIUM: 8.8 mg/dL — AB (ref 8.9–10.3)
CO2: 20 mmol/L — ABNORMAL LOW (ref 22–32)
Chloride: 108 mmol/L (ref 101–111)
Creatinine, Ser: 0.98 mg/dL (ref 0.61–1.24)
GFR calc Af Amer: >60 mL/min (ref >60)
GLUCOSE: 96 mg/dL (ref 65–99)
Potassium: 4.3 mmol/L (ref 3.5–5.1)
Sodium: 138 mmol/L (ref 135–145)

## 2015-05-31 MED ORDER — NITROGLYCERIN 0.4 MG SL SUBL: 0.4000 mg | SUBLINGUAL_TABLET | SUBLINGUAL | Status: DC | PRN

## 2015-05-31 MED ORDER — CLOPIDOGREL BISULFATE 75 MG PO TABS: 75.0000 mg | ORAL_TABLET | Freq: Every day | ORAL | Status: DC

## 2015-05-31 MED ORDER — ATENOLOL 25 MG PO TABS: 12.5000 mg | ORAL_TABLET | Freq: Every day | ORAL | Status: DC

## 2015-05-31 MED FILL — Bivalirudin For IV Soln 250 MG: INTRAVENOUS | Qty: 250 | Status: AC

## 2015-05-31 NOTE — Progress Notes (Addendum)
D/C instructions reviewed w/ pt and wife, questions answered.  Wife handles medicines and knows when he needs each one next.  Pt denies complaints, just tired from little sleep last night.  To front door per w/c with NT.

## 2015-05-31 NOTE — Discharge Summary (Signed)
Discharge Summary    Patient ID: Nicholas Douglas,  MRN: [id number], DOB/AGE: 1931-06-23 82 y.o.  Admit date: 05/28/2015 Discharge date: 05/31/2015  Primary Care Provider: [facility] Primary Cardiologist: Dr. [name]  Discharge Diagnoses    Principal Problem:   Unstable angina [facility]) Active Problems:   Essential hypertension, benign   Coronary artery disease involving native coronary artery of native heart with unstable angina pectoris (HCC)   Hyperlipidemia   Abdominal aortic aneurysm (AAA) (HCC)   Chronic kidney disease, stage III (moderate)   AAA (abdominal aortic aneurysm) (HCC)   Solitary kidney, acquired   CAD S/P PTCA only of RPAV-PL   Allergies Allergies  Allergen Reactions  . Imdur [Isosorbide Dinitrate]   . Lovastatin Other (See Comments)    Elevated liver enzymes  . Morphine Hives  . Morphine And Related Hives  . Nifedipine Other (See Comments)    Elevated liver enzymes    Diagnostic Studies/Procedures    LHC 05/31/15 Procedures    Coronary Balloon Angioplasty   Left Heart Cath and Coronary Angiography    Conclusion     Prox Cx lesion, 20% stenosed.  2nd Mrg lesion, 30% stenosed.  Prox RCA lesion, 30% stenosed.  Dist RCA lesion, 30% stenosed.  Post Atrio lesion, 99% stenosed. Post intervention, there is a 0% residual stenosis.  Significant coronary calcification involving the LAD, left circumflex, and RCA.  Widely patent stent extending from the LAD ostium to the mid LAD.  Widely patent stent in the left circumflex coronary artery with mild 20% narrowing prior to the stented segment and 30% narrowing at the ostium of the OM vessel.  Diffusely calcified proximal to mid RCA with proximal and mid 30% stenoses with 30% narrowing in the region of the acute margin but evidence for new 99% stenosis in a noncalcified distal RCA segment immediately after the PDA takeoff.  Successful PCI to the distal RCA stenosis with a long unsuccessful attempt  at trying to place a stent at this site due to the significant calcification in the mid and acute margin region of the RCA, but with successful PTCA with a 99% stenosis being reduced to 0%.       History of Present Illness     82 y/o M pt of Dr. [name] w a h/o CAD with numerous interventions dating back to 1987, 1991, 1993, 1997, & most recently 04/2013 (2.75x38 mm Promus to mLAD, 3.0x28 mm Promus to pLAD, & staged 2.5x28 mm Promus LCx), HTN, HLD, & L renal neoplasm s/p nephrectomy (2006) and infrarenal AAA who presented 05/28/15 with progressive angina over the preceding 2-3 weeks, particularly in the preceding couple of days.  EKG on arrival showed NSR with subtle inferolateral ST-T abnormalities new compared to 05/07/15. POC troponin in the ED was also abnormal at 0.16. Subsequently, he was admitted for further management.    Hospital Course     Patient was admitted to telemetry and placed on IV heparin. Cardiac enzymes were cycled and remained abnormal x3.   Troponin level peaked at 0.16. LHC was performed by Dr. [name] on 05/31/15. He was found to have significant coronary calcification involving the LAD, left circumflex, and RCA. Widely patent stent extending from the LAD ostium to the mid LAD. Widely patent stent in the left circumflex coronary artery with mild 20% narrowing prior to the stented segment and 30% narrowing at the ostium of the OM vessel as well as diffusely calcified proximal to mid RCA with proximal and mid 30% stenoses  with 30% narrowing in the region of the acute margin but evidence for new 99% stenosis in a noncalcified distal RCA segment immediately after the PDA takeoff.  He underwent successful PCI of the distal RCA stenosis with a long unsuccessful attempt at trying to place a stent at this site due to the significant calcification in the mid and acute margin region of the RCA, but with successful PTCA with a 99% stenosis being reduced to 0%. EF was not assessed given recent  echo 05/25/15 which showed normal EF of 55-60%. He tolerated the procedure well and left the cath lab in stable condition. He had no recurrent CP. He was placed on DAPT with ASA + Plavix, and continued on atenolol and valsartan. Despite his solitary kidney, his renal function remained stable post cath. Scr day of discharge had improved from 1.26>>0.98 with hydration. He did have mild hypertension, however decision was made not to increase his ARB to reduce risk of renal strain. His BP will be reassessed as an outpatient and meds adjusted accordingly. Also of note, an abdominal ultrasound was ordered to assess the status of his infrarenal AAA. This showed slightly ectatic abdominal aorta with atherosclerotic changes. Maximal transverse dimension is of the proximal abdominal aorta measuring 2.9 x 2.8 cm.  On hospital day 3, he was seen and examined by Dr. Ellyn Hack. Patient was w/o CP. No difficulaties ambulating with cardiac rehab. Cath site was stable as well as vital signs. He was felt to be stable for discharge home. He will f/u with Dr. [name] on 06/15/15.    Consultants: none   Discharge Vitals Blood pressure 169/67, pulse 57, temperature 98.1 F (36.7 C), temperature source Oral, resp. rate 21, height 5\' 8"  (1.727 m), weight 194 lb 0.1 oz (88 kg), SpO2 98 %.  Filed Weights   05/29/15 1914 05/30/15 0429 05/31/15 0426  Weight: 191 lb 9.3 oz (86.9 kg) 192 lb 6.4 oz (87.272 kg) 194 lb 0.1 oz (88 kg)    Labs & Radiologic Studies    CBC  Recent Labs  05/30/15 0602 05/31/15 0648  WBC 5.7 6.5  HGB 14.2 14.1  HCT 42.7 41.6  MCV 86.3 86.8  PLT 201 [id number]   Basic Metabolic Panel  Recent Labs  05/29/15 0417 05/30/15 0602 05/31/15 0648  NA 140 138 138  K 3.6 4.5 4.3  CL 105 104 108  CO2 25 23 20*  GLUCOSE 113* 100* 96  BUN 12 12 9   CREATININE 1.25* 1.26* 0.98  CALCIUM 8.7* 8.9 8.8*  MG 2.0  --   --   PHOS 3.4  --   --    Liver Function Tests  Recent Labs  05/29/15 0417  AST 33    ALT 36  ALKPHOS 41  BILITOT 0.7  PROT 6.2*  ALBUMIN 3.4*   No results for input(s): LIPASE, AMYLASE in the last 72 hours. Cardiac Enzymes  Recent Labs  05/28/15 2330 05/29/15 0417 05/29/15 1038  TROPONINI 0.11* 0.12* 0.10*   BNP Invalid input(s): POCBNP D-Dimer No results for input(s): DDIMER in the last 72 hours. Hemoglobin A1C No results for input(s): HGBA1C in the last 72 hours. Fasting Lipid Panel No results for input(s): CHOL, HDL, LDLCALC, TRIG, CHOLHDL, LDLDIRECT in the last 72 hours. Thyroid Function Tests No results for input(s): TSH, T4TOTAL, T3FREE, THYROIDAB in the last 72 hours.  Invalid input(s): FREET3 _____________  Dg Chest 2 View  05/31/2015  CLINICAL DATA:  SOB.POST ANGIOPLASTY,HX HTN,MI,CAD."" POOR INSPIRATION'' EXAM: CHEST - 2 VIEW  COMPARISON:  05/28/2015 FINDINGS: Low lung volumes with crowding of perihilar bronchovascular structures. Subsegmental atelectasis or linear scarring in the lung bases as before. No new infiltrate or overt edema. Heart size normal. No effusion.  No pneumothorax. Visualized skeletal structures are unremarkable. Surgical clips in the left mid abdomen. IMPRESSION: Low volumes with stable bibasilar atelectasis. Electronically Signed   By: Lucrezia Europe M.D.   On: 05/31/2015 09:02   Dg Chest 2 View  05/28/2015  CLINICAL DATA:  Chronic mid chest tightness, fatigue and shortness of breath. Initial encounter. EXAM: CHEST  2 VIEW COMPARISON:  Chest radiograph performed 04/20/2015 FINDINGS: The lungs are hypoexpanded. Chronic peribronchial thickening is noted. No pleural effusion or pneumothorax is seen. The heart is normal in size; the mediastinal contour is within normal limits. No acute osseous abnormalities are seen. Clips are noted within the right upper quadrant, reflecting prior cholecystectomy. IMPRESSION: Lungs hypoexpanded. Chronic peribronchial thickening noted. Lungs otherwise grossly clear. Electronically Signed   By: Garald Balding  M.D.   On: 05/28/2015 18:50   US Aorta  05/29/2015  CLINICAL DATA:  82 year old hypertensive male.  Initial encounter. EXAM: ULTRASOUND OF ABDOMINAL AORTA TECHNIQUE: Ultrasound examination of the abdominal aorta was performed to evaluate for abdominal aortic aneurysm. COMPARISON:  08/26/2012 CT. FINDINGS: Abdominal Aorta Slightly ectatic abdominal aorta with atherosclerotic changes. Maximal transverse dimension is of the proximal abdominal aorta measuring 2.9 x 2.8 cm. IMPRESSION: No abdominal aorta aneurysm noted. Atherosclerotic changes with slight ectasia as noted above. Electronically Signed   By: Genia Del M.D.   On: 05/29/2015 08:02   Disposition   Pt is being discharged home today in good condition.  Follow-up Plans & Appointments    Follow-up Information    Follow up with [name], MD On 06/15/2015.   Specialty:  Cardiology   Why:  9:00 AM    Contact information:   [address] [place] [address] [phone]      Discharge Instructions    Amb Referral to Cardiac Rehabilitation    Complete by:  As directed   Diagnosis:  PTCA           Discharge Medications   Discharge Medication List as of 05/31/2015 10:38 AM    CONTINUE these medications which have CHANGED   Details  atenolol (TENORMIN) 25 MG tablet Take 0.5 tablets (12.5 mg total) by mouth daily., Starting 05/31/2015, Until Discontinued, Normal    clopidogrel (PLAVIX) 75 MG tablet Take 1 tablet (75 mg total) by mouth daily., Starting 05/31/2015, Until Discontinued, Normal    nitroGLYCERIN (NITROSTAT) 0.4 MG SL tablet Place 1 tablet (0.4 mg total) under the tongue every 5 (five) minutes as needed for chest pain., Starting 05/31/2015, Until Discontinued, Normal      CONTINUE these medications which have NOT CHANGED   Details  acetaminophen (TYLENOL) 500 MG tablet Take 500 mg by mouth daily as needed for moderate pain., Until Discontinued, Historical Med    amLODipine (NORVASC) 10 MG  tablet Take 1 tablet (10 mg total) by mouth daily., Starting 11/15/2014, Until Discontinued, Normal    aspirin 81 MG tablet Take 81 mg by mouth at bedtime. , Until Discontinued, Historical Med    ezetimibe (ZETIA) 10 MG tablet Take 1 tablet (10 mg total) by mouth daily., Starting 12/22/2014, Until Discontinued, Normal    finasteride (PROSCAR) 5 MG tablet Take 1 tablet (5 mg total) by mouth daily., Starting 12/22/2013, Until Discontinued, Normal    Icosapent Ethyl 1 g CAPS Take 1  g by mouth 2 (two) times daily., Starting 04/05/2015, Until Discontinued, Normal    latanoprost (XALATAN) 0.005 % ophthalmic solution Place 1 drop into both eyes at bedtime., Starting 05/11/2015, Until Discontinued, Historical Med    Pitavastatin Calcium (LIVALO) 2 MG TABS Take 1 tablet (2 mg total) by mouth once., Starting 03/24/2014, Normal    traZODone (DESYREL) 50 MG tablet Take 50 mg by mouth at bedtime as needed for sleep. , Starting 05/22/2015, Until Discontinued, Historical Med    valsartan (DIOVAN) 80 MG tablet Take 1 tablet (80 mg total) by mouth daily., Starting 05/17/2015, Until Discontinued, Normal         Aspirin prescribed at discharge?  Yes High Intensity Statin Prescribed? (Lipitor 40-80mg  or Crestor 20-40mg ): Yes Beta Blocker Prescribed? Yes For EF <40%, was ACEI/ARB Prescribed? Yes ADP Receptor Inhibitor Prescribed? (i.e. Plavix etc.-Includes Medically Managed Patients): Yes For EF <40%, Aldosterone Inhibitor Prescribed? No: EF >40% Was EF assessed during THIS hospitalization? No: previously assessed 05/24/25 Was Cardiac Rehab II ordered? (Included Medically managed Patients): No:    Outstanding Labs/Studies  None   Duration of Discharge Encounter   Greater than 30 minutes including physician time.  SignedLyda Jester PA-C 05/31/2015, 2:14 PM  I personally saw and evaluated the patient this morning prior to his discharge along with Ellen Henri, PA-C. I personally performed  physical examination noted above. I reviewed the discharge summary noted above and agree with her summary.  The patient has been noticing intermittent potential cardiac symptoms for the last several months. He was recently evaluated with an echocardiogram Myoview that were relatively unhelpful but then presented with a more crescendo unstable process to [facility] emergency room. Since he just had a noninvasive evaluation, I felt the best course of action was to proceed with cardiac catheterization.  He was admitted with symptoms of her very concerning for possible unstable angina/mild non-STEMI. Because of his solitary kidney and mild renal insufficiency, he was hydrated overnight after his first day with artery catheterization Tuesday, April 25 finding significant disease in the proximal right posterior AV groove branch that was only able to be reached with balloon for balloon angioplasty. No stent was not able to successfully advanced to the lesion despite multiple courageous attempts.  He has done wonderfully since his PTCA yesterday. He has had no further anginal symptoms and has been living in the hallway without difficulty. Radial site is clean dry and intact. His renal function is actually improved with post catheterization hydration. He is on stable regimen with beta blocker, statin and now aspirin plus Plavix along with calcium channel blocker.    Leonie Man, M.D., M.S. Interventional Cardiologist   Pager # [phone] Phone # [phone] [address] [place], [place] [address]

## 2015-05-31 NOTE — Progress Notes (Signed)
BP noted to be 192/71 this am after walking with cardiac rehab.  Recheck after resting down to 169/67.  Pt denies complaints, eager for d/c.  IV NTG d/c'd and AM meds given.

## 2015-05-31 NOTE — Progress Notes (Signed)
CARDIAC REHAB PHASE I   PRE:  Rate/Rhythm: 62 SR    BP: sitting 172/68    SaO2:   MODE:  Ambulation: 1000 ft   POST:  Rate/Rhythm: 83 SR    BP: sitting 192/71     SaO2:   Tolerated well, no c/o. Ed completed/reviewed. Will send referral to [facility]. Pt not very interested.  [place], ACSM 05/31/2015 8:38 AM

## 2015-06-08 ENCOUNTER — Other Ambulatory Visit: Payer: Self-pay | Admitting: Cardiovascular Disease

## 2015-06-08 NOTE — Telephone Encounter (Signed)
Rx(s) sent to pharmacy electronically.  

## 2015-06-15 ENCOUNTER — Ambulatory Visit (INDEPENDENT_AMBULATORY_CARE_PROVIDER_SITE_OTHER): Payer: Medicare Other | Admitting: Cardiovascular Disease

## 2015-06-15 ENCOUNTER — Telehealth: Payer: Self-pay | Admitting: Cardiovascular Disease

## 2015-06-15 ENCOUNTER — Encounter: Payer: Self-pay | Admitting: Cardiovascular Disease

## 2015-06-15 VITALS — BP 158/91 | Ht 67.0 in | Wt 194.2 lb

## 2015-06-15 DIAGNOSIS — N183 Chronic kidney disease, stage 3 unspecified: Secondary | ICD-10-CM

## 2015-06-15 DIAGNOSIS — Z9861 Coronary angioplasty status: Secondary | ICD-10-CM

## 2015-06-15 DIAGNOSIS — I2510 Atherosclerotic heart disease of native coronary artery without angina pectoris: Secondary | ICD-10-CM

## 2015-06-15 DIAGNOSIS — I10 Essential (primary) hypertension: Secondary | ICD-10-CM | POA: Diagnosis not present

## 2015-06-15 DIAGNOSIS — Z905 Acquired absence of kidney: Secondary | ICD-10-CM | POA: Diagnosis not present

## 2015-06-15 MED ORDER — ISOSORBIDE MONONITRATE ER 30 MG PO TB24: 30.0000 mg | ORAL_TABLET | Freq: Every day | ORAL | Status: DC

## 2015-06-15 NOTE — Telephone Encounter (Signed)
New message      Calling to let the nurse know that [facility] to you.  Pt was seen this am

## 2015-06-15 NOTE — Patient Instructions (Signed)
Your physician has recommended you make the following change in your medication: start isosorbide 30 mg prescription. Take 1/2 tablet daily for the first few days, then increase to 1 tablet daily.  Your physician recommends that you schedule a follow-up appointment in: 4 months with Dr [name].

## 2015-06-15 NOTE — Telephone Encounter (Signed)
FORWARD TO WANDA WADELL CMA

## 2015-06-16 ENCOUNTER — Telehealth: Payer: Self-pay | Admitting: Cardiovascular Disease

## 2015-06-16 NOTE — Telephone Encounter (Signed)
Order Providers    Prescribing Provider Encounter Provider   [name], MD [name], MD    Medication Detail      Disp Refills Start End     Pitavastatin Calcium (LIVALO) 2 MG TABS 90 tablet 3 06/08/2015     Sig - Route: Take 1 tablet (2 mg total) by mouth daily. - Oral    E-Prescribing Status: Receipt confirmed by pharmacy (06/08/2015 4:47 PM EDT)     Pharmacy    [place], [place]

## 2015-06-16 NOTE — Telephone Encounter (Signed)
New message    *STAT* If patient is at the pharmacy, call can be transferred to refill team.   1. Which medications need to be refilled? (please list name of each medication and dose if known) livalo 2mg    2. Which pharmacy/location (including street and city if local pharmacy) is [facility] sent to[facility]   3. Do they need a 30 day or 90 day supply? 90 days supply

## 2015-06-16 NOTE — Telephone Encounter (Signed)
Med refilled 06/08/15

## 2015-06-17 NOTE — Progress Notes (Signed)
Patient ID: Nicholas Douglas, male   DOB: 10-Jul-1931, 82 y.o.   MRN: [id number]     HPI: Nicholas Douglas is an 82 y.o. Caucasian male who presents to the office today in follow-up of his recent hospitalization and cardiac catheterization/PCI.  Nicholas Douglas has known CAD and underwent numerous interventions dating back to 26, 1991, 1993, and in 1997. Cardiac catheterization in 2011 which showed preserved LV function with mild residual distal inferior apical hypocontractility. There is evidence for coronary calcification with segmental narrowing of his LAD of 30-40% proximally, 50% diffusely, in the midsegment, and 70-80% in the distal region, Nicholas Douglas had AV groove circumflex stenoses of 70 and 50% with a 40% OM 2 stenosis, and a 30-40 and 50% RCA stenoses with 80-90% stenosis in the acute marginal branch. Nicholas Douglas had been on medical therapy.  Nicholas Douglas  presented to [facility] in March 2015 with class IV angina and catheterization demonstrated severe 2 vessel CAD with significant current calcification of the LAD with up to 95% mid LAD stenosis and diffuse proximal stenosis. Nicholas Douglas underwent successful high-speed rotational atherectomy the following day by Dr. Martinique and a 1.5 mm bur was used. Mid LAD was stented with a 2.75x38 mm Promus stent in the proximal LAD was stented with a 3.0x28 mm Promus stent. Medical therapy was recommended for his distal LAD stenosis. 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  Nicholas Douglas underwent staged left circumflex PCI on 04/27/2013 by me with successful high-speed rotational atherectomy with a 1.5 and 1.75 mm burr, and ultimately had a 2.5x28 mm Promus premier DES stent inserted into the circumflex vessel, which was post dilated to approximately 2.6 mm.  Subsequently Nicholas Douglas has felt significantly improved with resolution of any chest pain  Nicholas Douglas  has a history of significant hyperlipidemia and has had significant increased number of small LDL  particles and insulin resistance. This seemed to markedly improve with the addition of Niaspan added to zetia and lovaza.  Remotely, Nicholas Douglas had been on statins consisting of Lipitor and subsequently Crestor. Nicholas Douglas did have transient LFT elevation. Nicholas Douglas also concerns of possible risk of developing dementia with statin therapy.  Nicholas Douglas had  some episodes of Niaspan induced diffuse flushing. Nicholas Douglas wanted to stop taking his Niaspan. Nicholas Douglas did have subsequent NMR off Niaspan and done just on Zetia and as well as 2 g of Lovaza.  This showed increased abnormalities such that his total cholesterol was 201, LDL had risen to 124, but Nicholas Douglas now had LDL small particles which have increased from 685 to 1348. Laboratory on 10/26/2012: LDL particle number was elevated at 1657, LDL cholesterol 112 triglycerides 155 and total cholesterol 190. Nicholas Douglas continued to be insulin resistant with insulin resistance scored 65. When I last saw Nicholas Douglas, we elected to try Livalo and ultimately titrated this to 4 mg daily to take in addition to his Zetia 10 mg. Laboratory on 02/22/2013: LDL particle number was  markedly improved at 774 with an HDL of 40 calculated LDL 50 small LDL particle #417. Insulin resistance score was also improved at 51.  In July 2015 Nicholas Douglas developed abdominal discomfort and was found to have acute appendicitis. On CT imaging Nicholas Douglas was also noted to have prostate enlargement with nodularity and thickening of the bladder base and cystoscopy was suggested for further evaluation. Nicholas Douglas is status post left nephrectomy. Nicholas Douglas also was noted to have a 2.3 cm infrarenal suprailiac abdominal aortic aneurysm.  Nicholas Douglas has been on  Livalo 2 mg, Zetia 10 mg for hyperlipidemia.  Lab work on 12/22/2013 showed a cholesterol 155, triglycerides 130, HDL 45, LDL 84.  Nicholas Douglas has been on amlodipine 10 mg atenolol 12.5 mg twice a day and valsartan 80 mg daily for blood pressure.  Nicholas Douglas continues to be on aspirin and Plavix for dual antiplatelet therapy.  A nuclear perfusion study on  06/30/2014  revealed normal perfusion and function with an ejection fraction of 57%.    When Nicholas Douglas underwent a CT scan for his appendicitis Nicholas Douglas was told of having a small abdominal aortic aneurysm.  His mother also had an abdominal aortic aneurysm.  The patient recently sprained his right ankle.  As result, Nicholas Douglas has not been able to be as active as Nicholas Douglas had in the past and has not been able to play golf which typically Nicholas Douglas had played up to 4 times per week, often scoring in the 70s.    Nicholas Douglas was recently admitted to the hospital in April 2017 with complaints of increasing episodes of chest discomfort.  Troponins were mildly positive at 0.12, consistent with a non-STEMI.  I performed cardiac catheterization on 05/28/2015.  This showed widely patent stents in the LAD and circumflex vessels.  The RCA was diffusely diseased with calcification in the proximal to mid region with distal 99% stenosis and a noncalcified distal RCA segment immediately after the PDA takeoff.  ECI was difficult due to the calcified RCA preventing placement of a stent since the stent was never able to be passed beyond this calcified angled segment despite even attempting guide liner support.  Ultimately, Nicholas Douglas underwent successful PTCA of the 99% stenosis, which was reduced to 0%.  Since his intervention.  Nicholas Douglas has noticed dramatic improvement in his previous symptomatology.  Nicholas Douglas is wanting to play golf again.  Nicholas Douglas denies PND or orthopnea.  Nicholas Douglas presents for evaluation.  Past Medical History  Diagnosis Date  . Hypertension   . Personal history of colonic polyps   . Other abnormal blood chemistry   . Other nonspecific abnormal serum enzyme levels   . Impotence of organic origin   . Tobacco use disorder   . Pure hypercholesterolemia   . Internal hemorrhoids without mention of complication   . Unspecified congenital cystic kidney disease   . Calculus of kidney     "that's how they found the cancer" (04/27/2013)  . BPH (benign prostatic hyperplasia)      Followed by [name]/urology  . Melanoma ([place]) 06/2010    Left arm  . Malignant neoplasm of kidney, except pelvis   . 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  Marland Kitchen History of myocardial infarction 1976  . Coronary artery disease involving native coronary artery of native heart with unstable angina pectoris ([place]) 12/03/2011    S/P Cardiac angioplasty 1986, 1991, 1993, 1997.  Last cardiac catheterization 2001.  Cardiolite 05/2011 low risk with normal EF 65%.  Followed by cardiology/Kiy[facility] Fidal[facility] of [facility] every six months.   . Unstable angina ([place]) 04/05/2013; 05/2015  . CAD S/P PTCA only of RPAV-PL 05/31/2015    99% --> 0%PAV - PTCA only (unable to advance STENT) due to RCA calcification - prox & distal RCA 30%; Patent LAD stent & Cx stent (~20% ISR).     Past Surgical History  Procedure Laterality Date  . Nephrectomy Left 2006  . Melanoma excision Left ~ 2007    "arm"  . Colonoscopy  10/05/2008    single polyp, [facility].  [name].  Repeat in 3 years.  . Carotid dopplers  03/08/2011    minimal plaque formation B. Symptoms: dizziness.  . Cardiolite  05/06/2011    low risk study; normal EF.  [facility]  Marland Kitchen Coronary angioplasty      "I've had 4" (04/27/2013)  . Coronary angioplasty with stent placement  04/2013; 04/27/2013    "2 + 1" (04/27/2013)  . Appendectomy  2014  . Eye surgery  11/04/2013    Cataract surgery B; [facility].  Marland Kitchen Percutaneous coronary rotoblator intervention (pci-r) N/A 04/06/2013    Procedure: PERCUTANEOUS CORONARY ROTOBLATOR INTERVENTION (PCI-R);  Surgeon: [name], MD;  Location: [facility] CATH LAB;  Service: Cardiovascular;  Laterality: N/A;  . Percutaneous coronary stent intervention (pci-s) N/A 04/27/2013    Procedure: PERCUTANEOUS CORONARY STENT INTERVENTION (PCI-S);  Surgeon: [name], MD;  Location: [facility] CATH LAB;  Service: Cardiovascular;  Laterality: N/A;  . Melanoma excision  02/07/2015    R upper back. [facility]  . Cardiac catheterization N/A 05/30/2015    Procedure:  Left Heart Cath and Coronary Angiography;  Surgeon: [name], MD;  Location: [place] CV LAB;  Service: Cardiovascular;  Laterality: N/A;  . Cardiac catheterization N/A 05/30/2015    Procedure: Coronary Balloon Angioplasty;  Surgeon: [name], MD;  Location: [place] CV LAB;  Service: Cardiovascular;  Laterality: N/A;    Allergies  Allergen Reactions  . Imdur [Isosorbide Dinitrate]   . Lovastatin Other (See Comments)    Elevated liver enzymes  . Morphine Hives  . Morphine And Related Hives  . Nifedipine Other (See Comments)    Elevated liver enzymes    Current Outpatient Prescriptions  Medication Sig Dispense Refill  . acetaminophen (TYLENOL) 500 MG tablet Take 500 mg by mouth daily as needed for moderate pain.    Marland Kitchen amLODipine (NORVASC) 10 MG tablet Take 1 tablet (10 mg total) by mouth daily. (Patient taking differently: Take 10 mg by mouth at bedtime. ) 90 tablet 2  . aspirin 81 MG tablet Take 81 mg by mouth at bedtime.     Marland Kitchen atenolol (TENORMIN) 25 MG tablet Take 0.5 tablets (12.5 mg total) by mouth daily. 30 tablet 6  . clopidogrel (PLAVIX) 75 MG tablet Take 1 tablet (75 mg total) by mouth daily. 90 tablet 6  . ezetimibe (ZETIA) 10 MG tablet Take 1 tablet (10 mg total) by mouth daily. 90 tablet 2  . finasteride (PROSCAR) 5 MG tablet Take 1 tablet (5 mg total) by mouth daily. (Patient taking differently: Take 2.5 mg by mouth daily. ) 90 tablet 3  . Icosapent Ethyl 1 g CAPS Take 1 g by mouth 2 (two) times daily. 180 capsule 3  . latanoprost (XALATAN) 0.005 % ophthalmic solution Place 1 drop into both eyes at bedtime.  1  . nitroGLYCERIN (NITROSTAT) 0.4 MG SL tablet Place 1 tablet (0.4 mg total) under the tongue every 5 (five) minutes as needed for chest pain. 25 tablet 2  . pantoprazole (PROTONIX) 40 MG tablet Take 1 tablet by mouth at bedtime.    . Pitavastatin Calcium (LIVALO) 2 MG TABS Take 1 tablet (2 mg total) by mouth daily. 90 tablet 3  . traZODone (DESYREL) 50  MG tablet Take 50 mg by mouth at bedtime as needed for sleep.   2  . valsartan (DIOVAN) 80 MG tablet Take 1 tablet (80 mg total) by mouth daily. 90 tablet 3  . isosorbide mononitrate (IMDUR) 30 MG 24 hr tablet Take 1 tablet (  30 mg total) by mouth daily. 90 tablet 3   No current facility-administered medications for this visit.    Socially Nicholas Douglas remains active. Nicholas Douglas is married has 5 children 10 grandchildren 2 great-grandchildren. Is no alcohol use. Nicholas Douglas typically scores below 75 and plays golf 4 days per week.  Nicholas Douglas has turned 82 any typically scores in the 70s, better than his age.  ROS General: Negative; No fevers, chills, or night sweats;  HEENT: Negative; No changes in vision or hearing, sinus congestion, difficulty swallowing Pulmonary: Negative; No cough, wheezing, shortness of breath, hemoptysis Cardiovascular: See history of present illness;  GI: Negative; No nausea, vomiting, diarrhea, or abdominal pain GU: Negative; No dysuria, hematuria, or difficulty voiding Musculoskeletal: Negative; no myalgias, joint pain, or weakness Hematologic/Oncology: Negative; no easy bruising, bleeding Endocrine: Negative; no heat/cold intolerance; no diabetes Neuro: Negative; no changes in balance, headaches Skin: Negative; No rashes or skin lesions Psychiatric: Negative; No behavioral problems, depression Sleep: Negative; No snoring, daytime sleepiness, hypersomnolence, bruxism, restless legs, hypnogognic hallucinations, no cataplexy Other comprehensive 14 point system review is negative.   PE BP 158/91 mmHg  Ht _0  (1.702 m)  Wt 194 lb 3.2 oz (88.089 kg)  BMI 30.41 kg/m2   Wt Readings from Last 3 Encounters:  06/15/15 194 lb 3.2 oz (88.089 kg)  05/31/15 194 lb 0.1 oz (88 kg)  05/26/15 193 lb (87.544 kg)   General: Alert, oriented, no distress.  Skin: normal turgor, no rashes HEENT: Normocephalic, atraumatic. Pupils round and reactive; sclera anicteric;no lid lag.  Nose without nasal septal  hypertrophy Mouth/Parynx benign; Mallinpatti scale 2 Neck: No JVD, no carotid bruits; normal carotid up stroke Lungs: clear to ausculatation and percussion; no wheezing or rales Chest wall: Nontender to palpation  Heart: RRR, s1 s2 normal 1/6 sem; no diastolic murmur. No rubs, thrills, or heaves. Abdomen: soft, nontender; no hepatosplenomehaly, BS+; abdominal aorta nontender and not significantly dilated by palpation. Back: No CVA tenderness. Pulses 2+; catheterization site well-healed Extremities: Right ankle in an ankle brace; no clubbing cyanosis or edema, Homan's sign negative  Neurologic: grossly nonfocal Psychological: Normal affect and mood  ECG (independently read by me): Sinus bradycardia 55 bpm.  No ectopy.  Normal intervals.  Nondiagnostic T changes in lead 3.  April 2017 ECG (independently read by me): Sinus bradycardia 55 bpm.  No ectopy.  No significant ST changes.  Normal intervals.  01/12/2015 ECG (independently read by me): Sinus bradycardia 54 bpm.  No ectopy.  Normal intervals.  June 2016 ECG (independently read by me): Sinus bradycardia 58 bpm.  Normal intervals.  No ectopy. Non-specific T change aVL  November 2015 ECG (independently read by me): Sinus bradycardia 54 bpm.  No significant ST-T changes.  Normal intervals.  May 2015 ECG (independently read by me): Sinus bradycardia 54 beats per minute.  No ectopy.  QTc interval 396 ms.  No significant ST changes.  04/22/2013 ECG (independently read by me): Sinus bradycardia 55 beats per minute. Nonspecific ST changes  03/01/2013 ECG (independently read by me): Sinus bradycardia 52 beats per minute. Normal intervals. No significant ST changes.  Prior ECG of 11/20/2012: Sinus rhythm at 51 beats per minute. QTc interval 400 ms. No significant ST changes.  LABS:  BMP Latest Ref Rng 05/31/2015 05/30/2015 05/29/2015  Glucose 65 - 99 mg/dL 96 100(H) 113(H)  BUN 6 - 20 mg/dL _1 Creatinine 0.61 - 1.24 mg/dL 0.98 1.26(H)  1.25(H)  Sodium 135 - 145 mmol/L 138 138 140  Potassium  3.5 - 5.1 mmol/L 4.3 4.5 3.6  Chloride 101 - 111 mmol/L 108 104 105  CO2 22 - 32 mmol/L 20(L) 23 25  Calcium 8.9 - 10.3 mg/dL 8.8(L) 8.9 8.7(L)   Hepatic Function Latest Ref Rng 05/29/2015 05/09/2015 04/20/2015  Total Protein 6.5 - 8.1 g/dL 6.2(L) 7.5 7.8  Albumin 3.5 - 5.0 g/dL 3.4(L) 4.6 4.8  AST 15 - 41 U/L 33 36(H) 33  ALT 17 - 63 U/L 36 40 35  Alk Phosphatase 38 - 126 U/L 41 56 59  Total Bilirubin 0.3 - 1.2 mg/dL 0.7 1.2 0.9   CBC Latest Ref Rng 05/31/2015 05/30/2015 05/29/2015  WBC 4.0 - 10.5 K/uL 6.5 5.7 5.8  Hemoglobin 13.0 - 17.0 g/dL 14.1 14.2 13.6  Hematocrit 39.0 - 52.0 % 41.6 42.7 41.4  Platelets 150 - 400 K/uL 194 201 207   Lab Results  Component Value Date   MCV 86.8 05/31/2015   MCV 86.3 05/30/2015   MCV 86.6 05/29/2015   Lab Results  Component Value Date   TSH 3.01 05/09/2015  .  Lipid Panel     Component Value Date/Time   CHOL 157 05/09/2015 1400   CHOL 144 01/02/2015 0805   TRIG 154* 05/09/2015 1400   TRIG 131 02/22/2013 0804   HDL 45 05/09/2015 1400   HDL 46 01/02/2015 0805   HDL 40 02/22/2013 0804   CHOLHDL 3.5 05/09/2015 1400   CHOLHDL 3.1 01/02/2015 0805   VLDL 31* 05/09/2015 1400   LDLCALC 81 05/09/2015 1400   LDLCALC 71 01/02/2015 0805   LDLCALC 50 02/22/2013 0804   RADIOLOGY: Dg Chest 2 View  08/17/2012   *RADIOLOGY REPORT*  Clinical Data: Renal cell carcinoma  CHEST - 2 VIEW  Comparison: 05/08/11  Findings: The cardiomediastinal silhouette is stable.  No acute infiltrate or pleural effusion.  No pulmonary edema.  Bony thorax is unremarkable.  IMPRESSION: No active disease.  No significant change.   Original Report Authenticated By: Lahoma Crocker, M.D.      ASSESSMENT AND PLAN: Nicholas Douglas is a young appearing 81 year old gentleman who has CAD dating back to 33 and has undergone multiple interventions in the past over a 10 year period from 63 to 1997. Nicholas Douglas developed unstable angina  symptomatology in March 2015 and catheterization done initially at [facility] showed multivessel disease.  Subsequently, Nicholas Douglas has undergone successful staged rotational coronary atherectomy initially involving the RCA in early March, and then on 04/27/2013 repeat intervention was done to the left circumflex system with rotational atherectomy.  At that time, Nicholas Douglas had a widely patent stent of his RCA, as well as a widely patent stent in his LAD.  Nicholas Douglas also had 60-70% mid LAD stenosis, which had not progressed.  When I last saw Nicholas Douglas, Nicholas Douglas had noticed a change in symptomatology with the development of more shortness of breath, and no energy.  Nicholas Douglas also has noticed occasional episodes of vague chest discomfort with possible left arm radiation.  Nicholas Douglas was hospitalized in late April and was found to have progressive 99% distal RCA stenosis after the PDA takeoff.  His stents in the LAD and circumflex are widely patent.  There was mild concomitant CAD in the circumflex.  His RCA was diffusely diseased and calcified with narrowings of 30% of the mid segment, but there was an angulated area of narrowing calcified narrowing of 40% before the acute margin, which prevented a stent from getting beyond this.  Subsequently, PTCA was performed to the distal stenosis.  Presently, Nicholas Douglas  has been fairly well pain-free following successful PTCA.  I am electing to add isosorbide 30 mg to his medical regimen in light of his concomitant CAD.  Continued aggressive lipid-lowering therapy is essential to potentially induce plaque regression.  Nicholas Douglas has been able to tolerate Livalo 2 mg combined with Zetia 10 mg but could not tolerate atorvastatin or Crestor in the past.  His blood pressure today is controlled on valsartan 80 mg in addition to his amlodipine 10 mg and atenolol 12.5 mg daily.  Nicholas Douglas is bradycardic with heart rate in the 55 range.  As long as Nicholas Douglas remains stable I will see Nicholas Douglas in 4 months for reevaluation.  Time spent: 25 minutes  [name], MD,  [facility]  06/17/2015 2:02 PM

## 2015-06-22 ENCOUNTER — Telehealth: Payer: Self-pay | Admitting: Cardiovascular Disease

## 2015-06-22 NOTE — Telephone Encounter (Signed)
Spoke with wife Informed patient's wife - sample are available for pick up  Wife states he has enough for @ 3 weeks.  Wife wants someone to call if she thinks prior authorization will take longer , if so she will pick up samples RN informed wife will send information to  [facility] CMA

## 2015-06-22 NOTE — Telephone Encounter (Signed)
Pt's wife is calling in to speak with Mariann Laster about the pt getting his Livalo. The pt is needing a prior authorization. Please f/u with the pt as soon as possible because he only has 2 days left.

## 2015-06-23 NOTE — Telephone Encounter (Signed)
Informed patients wife that Prior Josem Kaufmann has been approved

## 2015-07-05 ENCOUNTER — Encounter: Payer: Self-pay | Admitting: *Deleted

## 2015-07-05 ENCOUNTER — Other Ambulatory Visit: Payer: Self-pay | Admitting: *Deleted

## 2015-07-05 DIAGNOSIS — E785 Hyperlipidemia, unspecified: Secondary | ICD-10-CM

## 2015-07-05 DIAGNOSIS — I10 Essential (primary) hypertension: Secondary | ICD-10-CM

## 2015-07-05 DIAGNOSIS — I25810 Atherosclerosis of coronary artery bypass graft(s) without angina pectoris: Secondary | ICD-10-CM

## 2015-07-07 ENCOUNTER — Telehealth: Payer: Self-pay | Admitting: Cardiovascular Disease

## 2015-07-07 NOTE — Telephone Encounter (Signed)
New Message  Pt wife request a call back for clarification as to if the pt will need labs. She states that they were advised that after the hospital visit that the labs were not needed. Pt wife states that Mariann Laster the nurse emailed that labs are now needed. Please call back to clarify.

## 2015-07-07 NOTE — Telephone Encounter (Signed)
Spoke with pt wife, lab orders mailed to the pts confirmed home address.

## 2015-07-11 LAB — COMPREHENSIVE METABOLIC PANEL
ALBUMIN: 4.5 g/dL (ref 3.5–4.7)
ALT: 43 IU/L (ref 0–44)
AST: 41 IU/L — AB (ref 0–40)
Albumin/Globulin Ratio: 1.6 (ref 1.2–2.2)
Alkaline Phosphatase: 59 IU/L (ref 39–117)
BUN/Creatinine Ratio: 12 (ref 10–24)
BUN: 13 mg/dL (ref 8–27)
Bilirubin Total: 1 mg/dL (ref 0.0–1.2)
CALCIUM: 9.4 mg/dL (ref 8.6–10.2)
CO2: 23 mmol/L (ref 18–29)
CREATININE: 1.11 mg/dL (ref 0.76–1.27)
Chloride: 101 mmol/L (ref 96–106)
GFR calc Af Amer: 71 mL/min/{1.73_m2} (ref >59)
GFR, EST NON AFRICAN AMERICAN: 62 mL/min/{1.73_m2} (ref >59)
GLOBULIN, TOTAL: 2.8 g/dL (ref 1.5–4.5)
GLUCOSE: 109 mg/dL — AB (ref 65–99)
Potassium: 5 mmol/L (ref 3.5–5.2)
SODIUM: 140 mmol/L (ref 134–144)
Total Protein: 7.3 g/dL (ref 6.0–8.5)

## 2015-07-11 LAB — CBC
Hematocrit: 46.5 % (ref 37.5–51.0)
Hemoglobin: 15.7 g/dL (ref 12.6–17.7)
MCH: 29.1 pg (ref 26.6–33.0)
MCHC: 33.8 g/dL (ref 31.5–35.7)
MCV: 86 fL (ref 79–97)
PLATELETS: 236 10*3/uL (ref 150–379)
RBC: 5.39 x10E6/uL (ref 4.14–5.80)
RDW: 13.9 % (ref 12.3–15.4)
WBC: 6.4 10*3/uL (ref 3.4–10.8)

## 2015-07-11 LAB — LIPID PANEL
CHOL/HDL RATIO: 3.1 ratio (ref 0.0–5.0)
Cholesterol, Total: 138 mg/dL (ref 100–199)
HDL: 45 mg/dL (ref >39)
LDL CALC: 64 mg/dL (ref 0–99)
Triglycerides: 147 mg/dL (ref 0–149)
VLDL CHOLESTEROL CAL: 29 mg/dL (ref 5–40)

## 2015-07-11 LAB — TSH: TSH: 4.11 u[IU]/mL (ref 0.450–4.500)

## 2015-08-14 ENCOUNTER — Telehealth: Payer: Self-pay | Admitting: Cardiovascular Disease

## 2015-08-14 MED ORDER — AMLODIPINE BESYLATE 10 MG PO TABS: 10.0000 mg | ORAL_TABLET | Freq: Every day | ORAL | Status: DC

## 2015-08-14 NOTE — Telephone Encounter (Signed)
New message       *STAT* If patient is at the pharmacy, call can be transferred to refill team.   1. Which medications need to be refilled? (please list name of each medication and dose if known) amlodipine 10mg  2. Which pharmacy/location (including street and city if local pharmacy) is [facility] sent [facility] 3. Do they need a 30 day or 90 day supply?  [place]

## 2015-08-14 NOTE — Telephone Encounter (Signed)
Rx(s) sent to pharmacy electronically.  

## 2015-08-17 ENCOUNTER — Other Ambulatory Visit: Payer: Self-pay | Admitting: Cardiovascular Disease

## 2015-09-21 ENCOUNTER — Telehealth: Payer: Self-pay | Admitting: Cardiovascular Disease

## 2015-09-21 MED ORDER — EZETIMIBE 10 MG PO TABS: 10.0000 mg | ORAL_TABLET | Freq: Every day | ORAL | 2 refills | Status: DC

## 2015-09-21 NOTE — Telephone Encounter (Signed)
Rx(s) sent to pharmacy electronically.  

## 2015-09-21 NOTE — Telephone Encounter (Signed)
New message      *STAT* If patient is at the pharmacy, call can be transferred to refill team.   1. Which medications need to be refilled? (please list name of each medication and dose if known) zetia  10 mg   2. Which pharmacy/location (including street and city if local pharmacy) is [facility] to be [facility] to[facility]   3. Do they need a 30 day or 90 day supply? 90 day with 3 refills.

## 2015-10-16 ENCOUNTER — Telehealth: Payer: Self-pay | Admitting: Cardiovascular Disease

## 2015-10-16 NOTE — Telephone Encounter (Signed)
New message       Pt has an appt on 11-02-15.  Will Dr [name] want him to have labs drawn prior to appt?  Please call wife

## 2015-10-16 NOTE — Telephone Encounter (Signed)
Returned call, informed wife I would seek Dr. Evette Georges advice on any pre-visit labs (note patient was seen by [place] in May and had labwork in June). Wife requests if orders, submit to [facility]. She is aware I will return call and give instructions (fasting/nonfasting).

## 2015-10-27 NOTE — Telephone Encounter (Signed)
His last labs were in April and has orders in June but did not have labs done then. Have labs done prior to [facility]

## 2015-10-29 IMAGING — CR DG CHEST 2V
2 series · 2 of 2 positions shown · non-contrast
Comparison: Chest x-ray of April 03, 2013

CLINICAL DATA: Renal cell malignancy, asymptomatic, previous
history of tobacco use, status post prior cardiac stent placement.

EXAM:
CHEST  2 VIEW

[w chest pa]
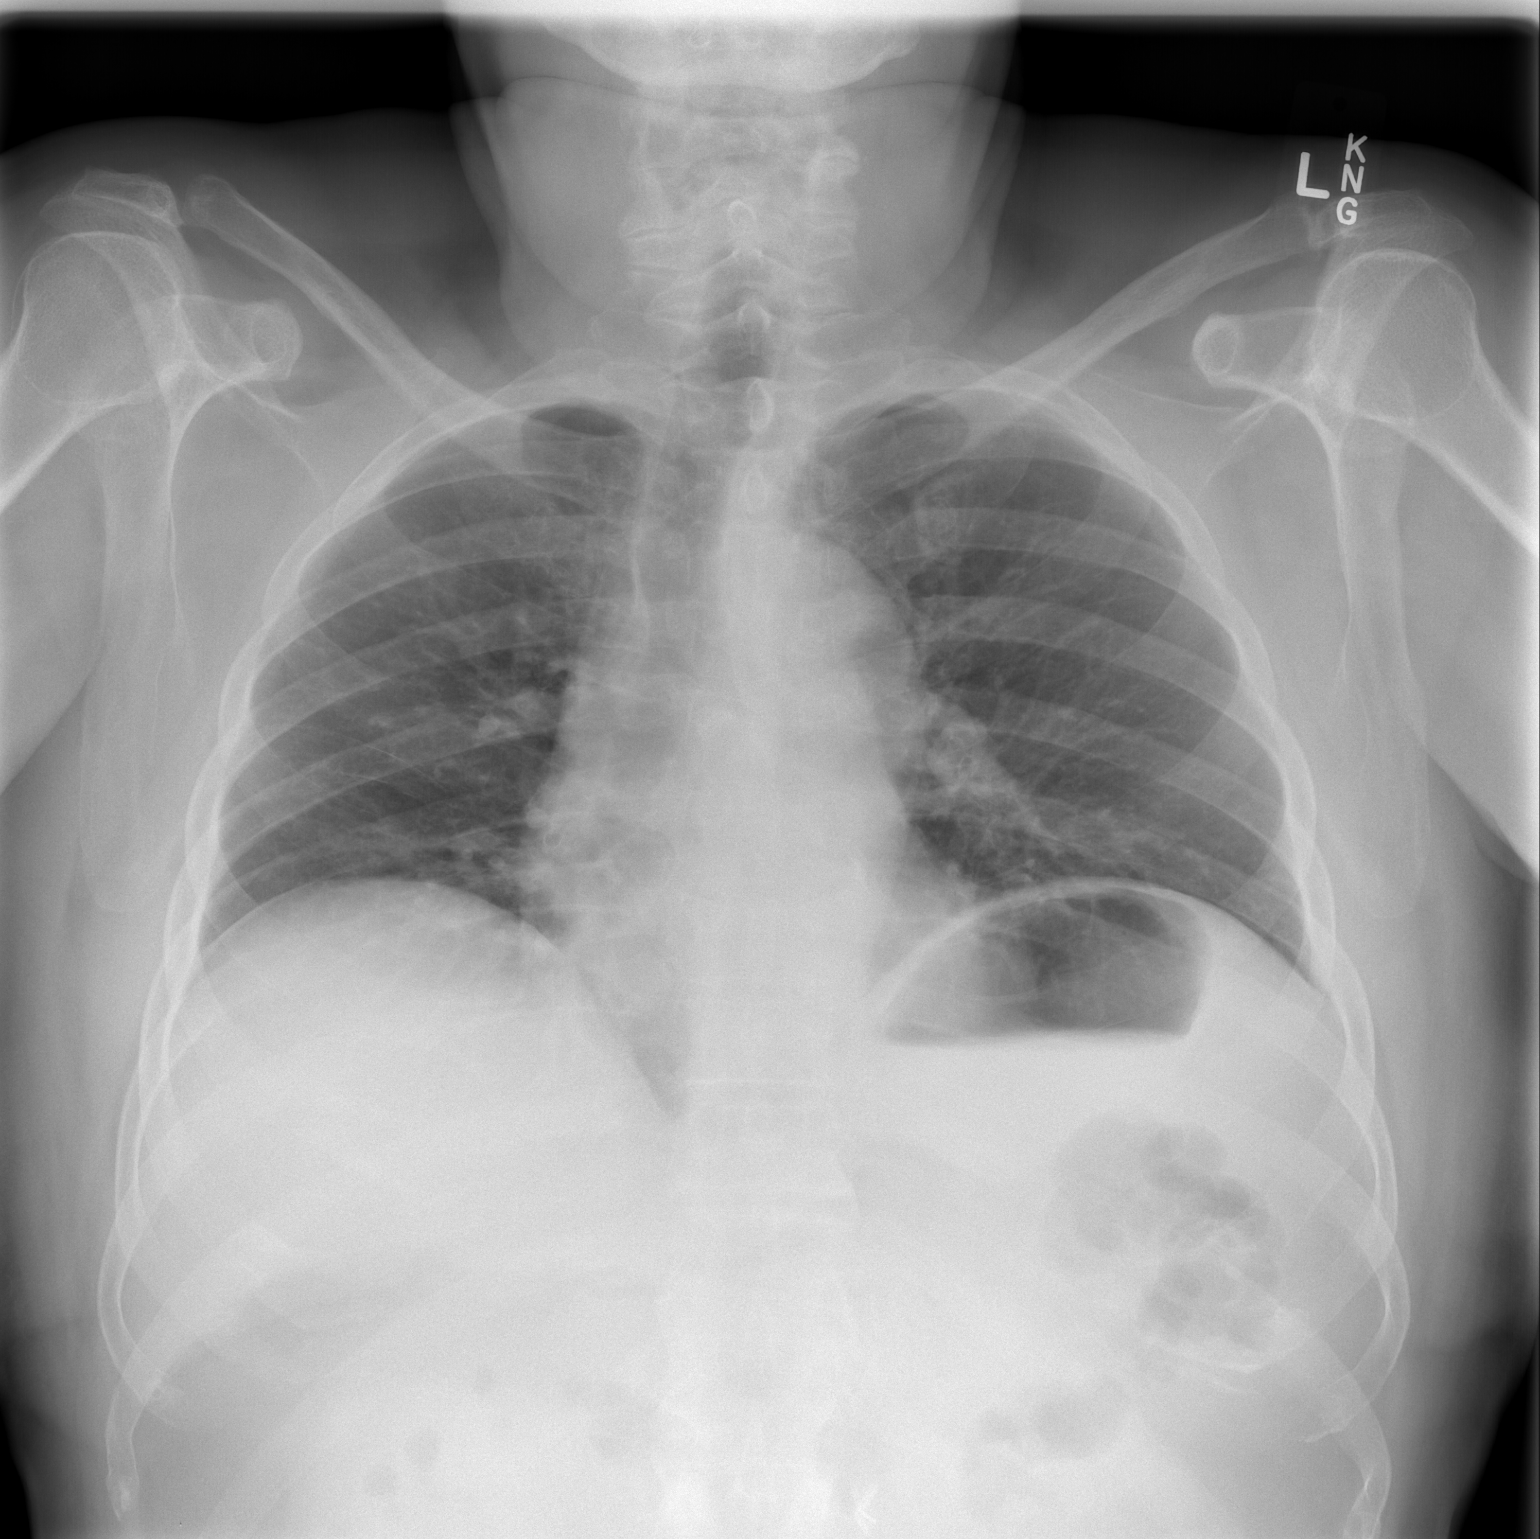

[w chest lat]
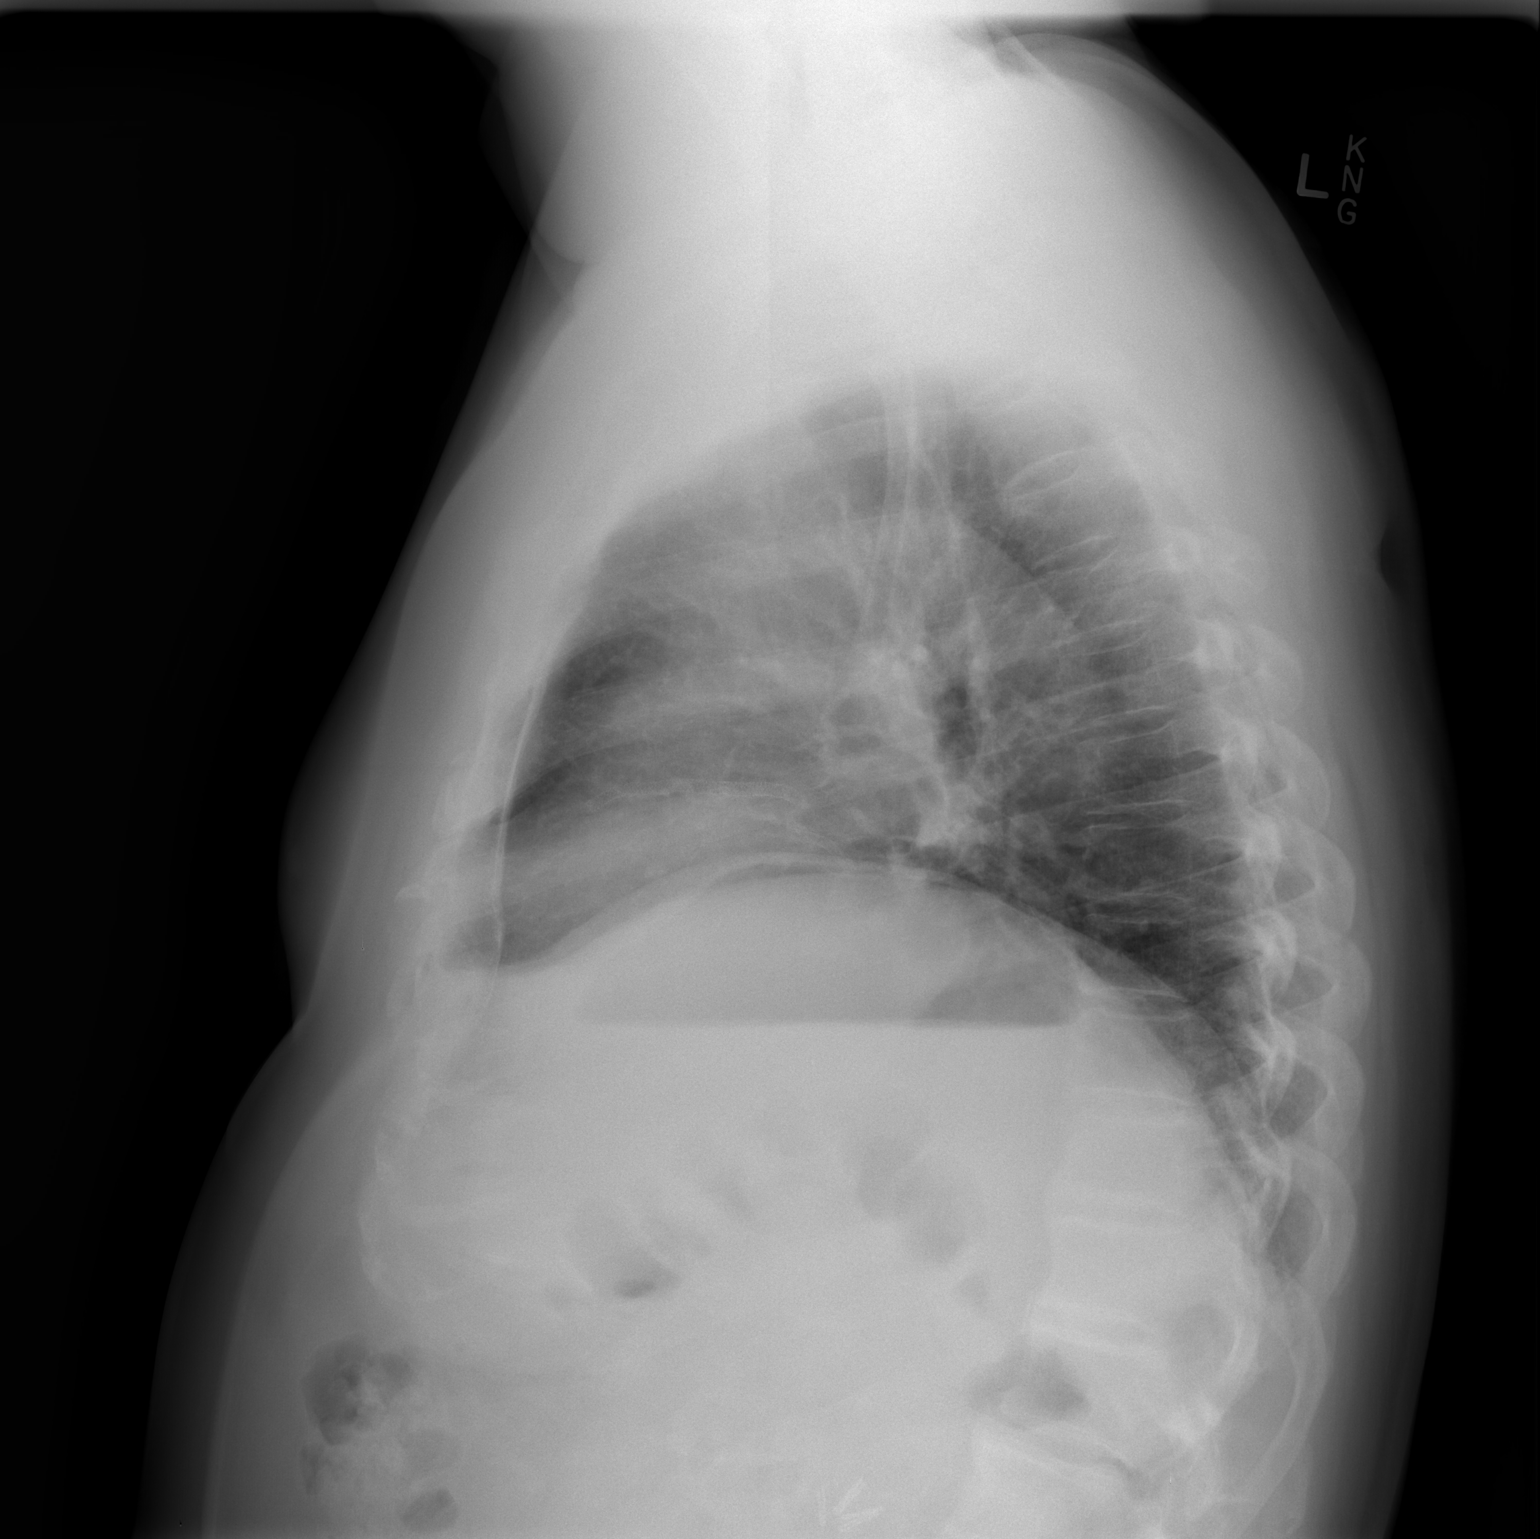

[2 of 2 positions shown; findings below may reference images not displayed]

FINDINGS: The lungs remain hypo inflated. There is minimal chronic bibasilar
atelectasis or scarring. There are no abnormal masses or nodules.
The heart and pulmonary vascularity are normal. There is stable
tortuosity of the descending thoracic aorta. The bony thorax is
unremarkable.
IMPRESSION: Stable bilateral hypoinflation. There is no acute cardiopulmonary
abnormality.

## 2015-10-30 NOTE — Telephone Encounter (Signed)
Have contacted pt w recommendations. Noted labwork was done in June on review. Pt will wait until appt to see if labwork recommended sooner than 74mo/yearly routine.

## 2015-11-02 ENCOUNTER — Ambulatory Visit (INDEPENDENT_AMBULATORY_CARE_PROVIDER_SITE_OTHER): Payer: Medicare Other | Admitting: Cardiovascular Disease

## 2015-11-02 ENCOUNTER — Encounter: Payer: Self-pay | Admitting: Cardiovascular Disease

## 2015-11-02 VITALS — BP 152/84 | HR 60 | Ht 69.0 in | Wt 197.4 lb

## 2015-11-02 DIAGNOSIS — I10 Essential (primary) hypertension: Secondary | ICD-10-CM

## 2015-11-02 DIAGNOSIS — Z79899 Other long term (current) drug therapy: Secondary | ICD-10-CM

## 2015-11-02 DIAGNOSIS — E785 Hyperlipidemia, unspecified: Secondary | ICD-10-CM | POA: Diagnosis not present

## 2015-11-02 DIAGNOSIS — I2510 Atherosclerotic heart disease of native coronary artery without angina pectoris: Secondary | ICD-10-CM | POA: Diagnosis not present

## 2015-11-02 DIAGNOSIS — C44311 Basal cell carcinoma of skin of nose: Secondary | ICD-10-CM

## 2015-11-02 NOTE — Patient Instructions (Signed)
Medication Instructions:  Continue current medications  Labwork: CBC, TSH, CMP, Fasting Lipids in March 2018  Testing/Procedures: None Ordered  Follow-Up: Your physician recommends that you schedule a follow-up appointment in: April 2018   Any Other Special Instructions Will Be Listed Below (If Applicable).   If you need a refill on your cardiac medications before your next appointment, please call your pharmacy.

## 2015-11-04 NOTE — Progress Notes (Signed)
Patient ID: Nicholas Douglas, male   DOB: May 20, 1931, 82 y.o.   MRN: [id number]     HPI: Nicholas Douglas is an 82 y.o. Caucasian male who presents to the office today for a 4 month evaluation.  [name] has known CAD and underwent numerous interventions dating back to 17, 1991, 1993, and in 1997. Cardiac catheterization in 2011 which showed preserved LV function with mild residual distal inferior apical hypocontractility. There is evidence for coronary calcification with segmental narrowing of his LAD of 30-40% proximally, 50% diffusely, in the midsegment, and 70-80% in the distal region, he had AV groove circumflex stenoses of 70 and 50% with a 40% OM 2 stenosis, and a 30-40 and 50% RCA stenoses with 80-90% stenosis in the acute marginal branch. He had been on medical therapy.  He  presented to [facility] in March 2015 with class IV angina and catheterization demonstrated severe 2 vessel CAD with significant current calcification of the LAD with up to 95% mid LAD stenosis and diffuse proximal stenosis. He underwent successful high-speed rotational atherectomy the following day by Dr. Martinique and a 1.5 mm bur was used. Mid LAD was stented with a 2.75x38 mm Promus stent in the proximal LAD was stented with a 3.0x28 mm Promus stent. Medical therapy was recommended for his distal LAD stenosis. He has only one kidney and staged intervention to the left circumflex coronary artery was recommended.  He underwent staged left circumflex PCI on 04/27/2013 by me with successful high-speed rotational atherectomy with a 1.5 and 1.75 mm burr, and ultimately had a 2.5x28 mm Promus premier DES stent inserted into the circumflex vessel, which was post dilated to approximately 2.6 mm.  Subsequently he has felt significantly improved with resolution of any chest pain  He  has a history of significant hyperlipidemia and has had significant increased number of small LDL particles and insulin resistance. This seemed to markedly  improve with the addition of Niaspan added to zetia and lovaza.  Remotely, he had been on statins consisting of Lipitor and subsequently Crestor. He did have transient LFT elevation. He also concerns of possible risk of developing dementia with statin therapy.  He had  some episodes of Niaspan induced diffuse flushing. He wanted to stop taking his Niaspan. He did have subsequent NMR off Niaspan and done just on Zetia and as well as 2 g of Lovaza.  This showed increased abnormalities such that his total cholesterol was 201, LDL had risen to 124, but he now had LDL small particles which have increased from 685 to 1348. Laboratory on 10/26/2012: LDL particle number was elevated at 1657, LDL cholesterol 112 triglycerides 155 and total cholesterol 190. He continued to be insulin resistant with insulin resistance scored 65. When I last saw him, we elected to try Livalo and ultimately titrated this to 4 mg daily to take in addition to his Zetia 10 mg. Laboratory on 02/22/2013: LDL particle number was  markedly improved at 774 with an HDL of 40 calculated LDL 50 small LDL particle #417. Insulin resistance score was also improved at 51.  In July 2015 he developed abdominal discomfort and was found to have acute appendicitis. On CT imaging he was also noted to have prostate enlargement with nodularity and thickening of the bladder base and cystoscopy was suggested for further evaluation. He is status post left nephrectomy. He also was noted to have a 2.3 cm infrarenal suprailiac abdominal aortic aneurysm.  He has been on Livalo 2 mg, Zetia  10 mg for hyperlipidemia.  Lab work on 12/22/2013 showed a cholesterol 155, triglycerides 130, HDL 45, LDL 84.  He has been on amlodipine 10 mg atenolol 12.5 mg twice a day and valsartan 80 mg daily for blood pressure.  He continues to be on aspirin and Plavix for dual antiplatelet therapy.  A nuclear perfusion study on 06/30/2014  revealed normal perfusion and function with an ejection  fraction of 57%.    When he underwent a CT scan for his appendicitis he was told of having a small abdominal aortic aneurysm.  His mother also had an abdominal aortic aneurysm.  The patient recently sprained his right ankle.  As result, he has not been able to be as active as he had in the past and has not been able to play golf which typically he had played up to 4 times per week, often scoring in the 70s.    He was recently admitted to the hospital in April 2017 with complaints of increasing episodes of chest discomfort.  Troponins were mildly positive at 0.12, consistent with a non-STEMI.  I performed cardiac catheterization on 05/28/2015.  This showed widely patent stents in the LAD and circumflex vessels.  The RCA was diffusely diseased with calcification in the proximal to mid region with distal 99% stenosis and a noncalcified distal RCA segment immediately after the PDA takeoff.  ECI was difficult due to the calcified RCA preventing placement of a stent since the stent was never able to be passed beyond this calcified angled segment despite even attempting guide liner support.  Ultimately, he underwent successful PTCA of the 99% stenosis, which was reduced to 0%.  Since his intervention.  He has noticed dramatic improvement in his previous symptomatology.  He continues to feel well and is playing golf 3 times per week. He recently had a basal cell ca removed from his nose. He denies chest pain or dyspnea.  He presents for follow-up cardiology evaluation.  Past Medical History:  Diagnosis Date  . BPH (benign prostatic hyperplasia)    Followed by [name]/urology  . CAD S/P PTCA only of RPAV-PL 05/31/2015   99% --> 0%PAV - PTCA only (unable to advance STENT) due to RCA calcification - prox & distal RCA 30%; Patent LAD stent & Cx stent (~20% ISR).   . Calculus of kidney    "that's how they found the cancer" (04/27/2013)  . Coronary artery disease involving native coronary artery of native heart  with unstable angina pectoris ([place]) 12/03/2011   S/P Cardiac angioplasty 1986, 1991, 1993, 1997.  Last cardiac catheterization 2001.  Cardiolite 05/2011 low risk with normal EF 65%.  Followed by cardiology/[facility] every six months.   . History of myocardial infarction 1976  . Hypertension   . Impotence of organic origin   . Internal hemorrhoids without mention of complication   . Malignant neoplasm of kidney, except pelvis   . Melanoma ([place]) 06/2010   Left arm  . 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  . Other abnormal blood chemistry   . Other nonspecific abnormal serum enzyme levels   . Personal history of colonic polyps   . Pure hypercholesterolemia   . Tobacco use disorder   . Unspecified congenital cystic kidney disease   . Unstable angina ([place]) 04/05/2013; 05/2015    Past Surgical History:  Procedure Laterality Date  . APPENDECTOMY  2014  . CARDIAC CATHETERIZATION N/A 05/30/2015   Procedure: Left Heart Cath and  Coronary Angiography;  Surgeon: [name], MD;  Location: [place] [facility];  Service: Cardiovascular;  Laterality: N/A;  . CARDIAC CATHETERIZATION N/A 05/30/2015   Procedure: Coronary Balloon Angioplasty;  Surgeon: [name], MD;  Location: [place] [facility];  Service: Cardiovascular;  Laterality: N/A;  . Cardiolite  05/06/2011   low risk study; normal EF.  [facility]  . carotid dopplers  03/08/2011   minimal plaque formation B. Symptoms: dizziness.  . COLONOSCOPY  10/05/2008   single polyp, [facility].  [name].  Repeat in 3 years.  . CORONARY ANGIOPLASTY     "I've had 4" (04/27/2013)  . CORONARY ANGIOPLASTY WITH STENT PLACEMENT  04/2013; 04/27/2013   "2 + 1" (04/27/2013)  . EYE SURGERY  11/04/2013   Cataract surgery B; [facility].  Marland Kitchen MELANOMA EXCISION Left ~ 2007   "arm"  . MELANOMA EXCISION  02/07/2015   R upper back. [facility]  . NEPHRECTOMY Left 2006  . PERCUTANEOUS CORONARY ROTOBLATOR INTERVENTION (PCI-R) N/A 04/06/2013   Procedure: PERCUTANEOUS  CORONARY ROTOBLATOR INTERVENTION (PCI-R);  Surgeon: [name], MD;  Location: [facility] C[facility];  Service: Cardiovascular;  Laterality: N/A;  . PERCUTANEOUS CORONARY STENT INTERVENTION (PCI-S) N/A 04/27/2013   Procedure: PERCUTANEOUS CORONARY STENT INTERVENTION (PCI-S);  Surgeon: [name], MD;  Location: [facility] CATH LAB;  Service: Cardiovascular;  Laterality: N/A;    Allergies  Allergen Reactions  . Imdur [Isosorbide Dinitrate]   . Lovastatin Other (See Comments)    Elevated liver enzymes  . Morphine Hives  . Morphine And Related Hives  . Nifedipine Other (See Comments)    Elevated liver enzymes    Current Outpatient Prescriptions  Medication Sig Dispense Refill  . acetaminophen (TYLENOL) 500 MG tablet Take 500 mg by mouth daily as needed for moderate pain.    Marland Kitchen amLODipine (NORVASC) 10 MG tablet Take 1 tablet (10 mg total) by mouth daily. 90 tablet 3  . aspirin 81 MG tablet Take 81 mg by mouth at bedtime.     Marland Kitchen atenolol (TENORMIN) 25 MG tablet TAKE ONE-HALF (1/2) TABLET TWICE A DAY 90 tablet 1  . clopidogrel (PLAVIX) 75 MG tablet Take 1 tablet (75 mg total) by mouth daily. 90 tablet 6  . ezetimibe (ZETIA) 10 MG tablet Take 1 tablet (10 mg total) by mouth daily. 90 tablet 2  . finasteride (PROSCAR) 5 MG tablet Take 1 tablet (5 mg total) by mouth daily. (Patient taking differently: Take 2.5 mg by mouth daily. ) 90 tablet 3  . Icosapent Ethyl 1 g CAPS Take 1 g by mouth 2 (two) times daily. 180 capsule 3  . isosorbide mononitrate (IMDUR) 30 MG 24 hr tablet Take 15 mg by mouth daily.    Marland Kitchen latanoprost (XALATAN) 0.005 % ophthalmic solution Place 1 drop into both eyes at bedtime.  1  . nitroGLYCERIN (NITROSTAT) 0.4 MG SL tablet Place 1 tablet (0.4 mg total) under the tongue every 5 (five) minutes as needed for chest pain. 25 tablet 2  . Pitavastatin Calcium (LIVALO) 2 MG TABS Take 1 tablet (2 mg total) by mouth daily. 90 tablet 3  . traZODone (DESYREL) 50 MG tablet Take 50 mg by mouth at  bedtime as needed for sleep.   2  . valsartan (DIOVAN) 80 MG tablet Take 1 tablet (80 mg total) by mouth daily. 90 tablet 3   No current facility-administered medications for this visit.     Socially he remains active. He is married has 5 children 10 grandchildren 2  great-grandchildren. Is no alcohol use. He typically scores below 75 and plays golf 4 days per week.  He has turned 82 any typically scores in the 70s, better than his age.  ROS General: Negative; No fevers, chills, or night sweats;  HEENT: Negative; No changes in vision or hearing, sinus congestion, difficulty swallowing Pulmonary: Negative; No cough, wheezing, shortness of breath, hemoptysis Cardiovascular: See history of present illness;  GI: Negative; No nausea, vomiting, diarrhea, or abdominal pain GU: Negative; No dysuria, hematuria, or difficulty voiding Musculoskeletal: Negative; no myalgias, joint pain, or weakness Hematologic/Oncology: Negative; no easy bruising, bleeding Endocrine: Negative; no heat/cold intolerance; no diabetes Neuro: Negative; no changes in balance, headaches Skin: Negative; No rashes or skin lesions Psychiatric: Negative; No behavioral problems, depression Sleep: Negative; No snoring, daytime sleepiness, hypersomnolence, bruxism, restless legs, hypnogognic hallucinations, no cataplexy Other comprehensive 14 point system review is negative.   PE BP (!) 152/84 (BP Location: Right Arm, Patient Position: Sitting, Cuff Size: Normal)   Pulse 60   Ht '5\' 9"'  (1.753 m)   Wt 197 lb 6 oz (89.5 kg)   BMI 29.15 kg/m    Wt Readings from Last 3 Encounters:  11/02/15 197 lb 6 oz (89.5 kg)  06/15/15 194 lb 3.2 oz (88.1 kg)  05/31/15 194 lb 0.1 oz (88 kg)   General: Alert, oriented, no distress.  Skin: normal turgor, no rashes HEENT: Normocephalic, atraumatic. Pupils round and reactive; sclera anicteric;no lid lag.  Nose without nasal septal hypertrophy Mouth/Parynx benign; Mallinpatti scale 2 Neck:  No JVD, no carotid bruits; normal carotid up stroke Lungs: clear to ausculatation and percussion; no wheezing or rales Chest wall: Nontender to palpation  Heart: RRR, s1 s2 normal 1/6 sem; no diastolic murmur. No rubs, thrills, or heaves. Abdomen: soft, nontender; no hepatosplenomehaly, BS+; abdominal aorta nontender and not significantly dilated by palpation. Back: No CVA tenderness. Pulses 2+; catheterization site well-healed Extremities: Right ankle in an ankle brace; no clubbing cyanosis or edema, Homan's sign negative  Neurologic: grossly nonfocal Psychological: Normal affect and mood  ECG (independently read by me): Normal sinus rhythm at 60 bpm.  Nonspecific T changes.  Intervals are normal.  May 2017 ECG (independently read by me): Sinus bradycardia 55 bpm.  No ectopy.  Normal intervals.  Nondiagnostic T changes in lead 3.  April 2017 ECG (independently read by me): Sinus bradycardia 55 bpm.  No ectopy.  No significant ST changes.  Normal intervals.  01/12/2015 ECG (independently read by me): Sinus bradycardia 54 bpm.  No ectopy.  Normal intervals.  June 2016 ECG (independently read by me): Sinus bradycardia 58 bpm.  Normal intervals.  No ectopy. Non-specific T change aVL  November 2015 ECG (independently read by me): Sinus bradycardia 54 bpm.  No significant ST-T changes.  Normal intervals.  May 2015 ECG (independently read by me): Sinus bradycardia 54 beats per minute.  No ectopy.  QTc interval 396 ms.  No significant ST changes.  04/22/2013 ECG (independently read by me): Sinus bradycardia 55 beats per minute. Nonspecific ST changes  03/01/2013 ECG (independently read by me): Sinus bradycardia 52 beats per minute. Normal intervals. No significant ST changes.  Prior ECG of 11/20/2012: Sinus rhythm at 51 beats per minute. QTc interval 400 ms. No significant ST changes.  LABS:  BMP Latest Ref Rng & Units 07/10/2015 05/31/2015 05/30/2015  Glucose 65 - 99 mg/dL 109(H) 96 100(H)    BUN 8 - 27 mg/dL '13 9 12  ' Creatinine 0.76 - 1.27 mg/dL 1.11 0.98 1.26(H)  BUN/Creat Ratio 10 - 24 12 - -  Sodium 134 - 144 mmol/L 140 138 138  Potassium 3.5 - 5.2 mmol/L 5.0 4.3 4.5  Chloride 96 - 106 mmol/L 101 108 104  CO2 18 - 29 mmol/L 23 20(L) 23  Calcium 8.6 - 10.2 mg/dL 9.4 8.8(L) 8.9   Hepatic Function Latest Ref Rng & Units 07/10/2015 05/29/2015 05/09/2015  Total Protein 6.0 - 8.5 g/dL 7.3 6.2(L) 7.5  Albumin 3.5 - 4.7 g/dL 4.5 3.4(L) 4.6  AST 0 - 40 IU/L 41(H) 33 36(H)  ALT 0 - 44 IU/L 43 36 40  Alk Phosphatase 39 - 117 IU/L 59 41 56  Total Bilirubin 0.0 - 1.2 mg/dL 1.0 0.7 1.2  Bilirubin, Direct 0.00 - 0.40 mg/dL - - -   CBC Latest Ref Rng & Units 07/10/2015 05/31/2015 05/30/2015  WBC 3.4 - 10.8 x10E3/uL 6.4 6.5 5.7  Hemoglobin 13.0 - 17.0 g/dL - 14.1 14.2  Hematocrit 37.5 - 51.0 % 46.5 41.6 42.7  Platelets 150 - 379 x10E3/uL 236 194 201   Lab Results  Component Value Date   MCV 86 07/10/2015   MCV 86.8 05/31/2015   MCV 86.3 05/30/2015   Lab Results  Component Value Date   TSH 4.110 07/10/2015  .  Lipid Panel     Component Value Date/Time   CHOL 138 07/10/2015 0814   TRIG 147 07/10/2015 0814   TRIG 131 02/22/2013 0804   HDL 45 07/10/2015 0814   HDL 40 02/22/2013 0804   CHOLHDL 3.1 07/10/2015 0814   CHOLHDL 3.5 05/09/2015 1400   VLDL 31 (H) 05/09/2015 1400   LDLCALC 64 07/10/2015 0814   LDLCALC 50 02/22/2013 0804   RADIOLOGY: Dg Chest 2 View  08/17/2012   *RADIOLOGY REPORT*  Clinical Data: Renal cell carcinoma  CHEST - 2 VIEW  Comparison: 05/08/11  Findings: The cardiomediastinal silhouette is stable.  No acute infiltrate or pleural effusion.  No pulmonary edema.  Bony thorax is unremarkable.  IMPRESSION: No active disease.  No significant change.   Original Report Authenticated By: Lahoma Crocker, M.D.      ASSESSMENT AND PLAN: Mr. Demontre Padin is a young appearing 82 year old gentleman who has CAD dating back to 82 and has undergone multiple interventions in  the past over a 10 year period from 82 to 1997. He developed unstable angina symptomatology in March 2015 and catheterization done initially at [facility] showed multivessel disease.  He underwent successful staged rotational coronary atherectomy initially involving the RCA in early March, and then on 04/27/2013 repeat intervention was done to the left circumflex system with rotational atherectomy.  At that time, he had a widely patent stent of his RCA, as well as a widely patent stent in his LAD.  He also had 60-70% mid LAD stenosis, which had not progressed.  When I saw him earlier this year, he had noticed a change in symptomatology with the development of more shortness of breath, and no energy.  He also has noticed occasional episodes of vague chest discomfort with possible left arm radiation.  He was hospitalized in late April 2017 and was found to have progressive 99% distal RCA stenosis after the PDA takeoff.  His stents in the LAD and circumflex are widely patent.  There was mild concomitant CAD in the circumflex.  His RCA was diffusely diseased and calcified with narrowings of 30% of the mid segment, but there was an angulated area of narrowing calcified narrowing of 40% before the acute margin, which prevented a  stent from getting beyond this.  Subsequently, PTCA was performed to the distal stenosis.  Presently, as continued to do well following this most recent intervention.  He remains very active and is now back playing golf 3 times per week.  He recently had a basal cell CA removed from his nose and tolerated this well without cardiovascular compromise.  His blood pressure today is controlled on amlodipine 10 mg, atenolol 12.5 mg, valsartan 80 mg daily, in addition to his isosorbide mononitrate 30 mg.  He is tolerating Livalo 2 mg and also takes Zetia 10 mg for aggressive lipid-lowering therapy.  Laboratory in June 2017 showed total cholesterol 138, triglycerides 147, HDL 45, and LDL 64.  He takes  Protonix for GERD which is stable.  He continues to be on dual antiplatelet therapy with aspirin and Plavix and is tolerated this well.  He will continue his current medical regimen.  I will see him in 6 months for reevaluation, and prior to that office visit.  Repeat fasting blood work will be obtained.    Time spent: 25 minutes  [name], MD, [facility]  11/04/2015 12:57 PM

## 2016-01-15 ENCOUNTER — Encounter: Payer: Medicare Other | Admitting: Family Medicine

## 2016-01-23 ENCOUNTER — Encounter: Payer: Medicare Other | Admitting: Family Medicine

## 2016-01-30 ENCOUNTER — Encounter: Payer: Medicare Other | Admitting: Family Medicine

## 2016-02-14 ENCOUNTER — Other Ambulatory Visit: Payer: Self-pay | Admitting: Cardiovascular Disease

## 2016-02-14 NOTE — Telephone Encounter (Signed)
Rx(s) sent to pharmacy electronically.  

## 2016-04-05 ENCOUNTER — Telehealth: Payer: Self-pay | Admitting: Cardiovascular Disease

## 2016-04-05 MED ORDER — CLOPIDOGREL BISULFATE 75 MG PO TABS: 75.0000 mg | ORAL_TABLET | Freq: Every day | ORAL | 2 refills | Status: DC

## 2016-04-05 NOTE — Telephone Encounter (Signed)
New message       *STAT* If patient is at the pharmacy, call can be transferred to refill team.   1. Which medications need to be refilled? (please list name of each medication and dose if known) generic plavix 2. Which pharmacy/location (including street and city if local pharmacy) is medication to be sent to? [facility] 3. Do they need a 30 day or 90 day supply? 90 day

## 2016-04-08 ENCOUNTER — Other Ambulatory Visit: Payer: Self-pay | Admitting: Cardiovascular Disease

## 2016-04-08 MED ORDER — ICOSAPENT ETHYL 1 G PO CAPS: 1.0000 g | ORAL_CAPSULE | Freq: Two times a day (BID) | ORAL | 1 refills | Status: DC

## 2016-04-08 MED ORDER — CLOPIDOGREL BISULFATE 75 MG PO TABS: 75.0000 mg | ORAL_TABLET | Freq: Every day | ORAL | 1 refills | Status: DC

## 2016-04-08 NOTE — Telephone Encounter (Signed)
New message       *STAT* If patient is at the pharmacy, call can be transferred to refill team.   1. Which medications need to be refilled? (please list name of each medication and dose if known) generic plavix  2. Which pharmacy/location (including street and city if local pharmacy) is medication to be sent to? [facility]--[facility]  3. Do they need a 30 day or 90 day supply? 90 dy

## 2016-04-08 NOTE — Telephone Encounter (Signed)
Follow up        *STAT* If patient is at the pharmacy, call can be transferred to refill team.   1. Which medications need to be refilled? (please list name of each medication and dose if known) Icosapent 1g  2. Which pharmacy/location (including street and city if local pharmacy) is medication to be sent to? [facility]  3. Do they need a 30 day or 90 day supply? 90 day

## 2016-04-08 NOTE — Telephone Encounter (Signed)
Rx(s) sent to pharmacy electronically.  

## 2016-04-16 ENCOUNTER — Other Ambulatory Visit: Payer: Self-pay | Admitting: Cardiovascular Disease

## 2016-04-16 NOTE — Telephone Encounter (Signed)
REFILL 

## 2016-04-30 LAB — COMPREHENSIVE METABOLIC PANEL
A/G RATIO: 1.6 (ref 1.2–2.2)
ALBUMIN: 4.4 g/dL (ref 3.5–4.7)
ALT: 37 IU/L (ref 0–44)
AST: 34 IU/L (ref 0–40)
Alkaline Phosphatase: 58 IU/L (ref 39–117)
BILIRUBIN TOTAL: 0.8 mg/dL (ref 0.0–1.2)
BUN/Creatinine Ratio: 11 (ref 10–24)
BUN: 14 mg/dL (ref 8–27)
CALCIUM: 9.6 mg/dL (ref 8.6–10.2)
CO2: 28 mmol/L (ref 18–29)
Chloride: 100 mmol/L (ref 96–106)
Creatinine, Ser: 1.29 mg/dL — ABNORMAL HIGH (ref 0.76–1.27)
GFR calc Af Amer: 59 mL/min/{1.73_m2} — ABNORMAL LOW (ref >59)
GFR, EST NON AFRICAN AMERICAN: 51 mL/min/{1.73_m2} — AB (ref >59)
GLOBULIN, TOTAL: 2.7 g/dL (ref 1.5–4.5)
Glucose: 106 mg/dL — ABNORMAL HIGH (ref 65–99)
POTASSIUM: 5.2 mmol/L (ref 3.5–5.2)
Sodium: 139 mmol/L (ref 134–144)
TOTAL PROTEIN: 7.1 g/dL (ref 6.0–8.5)

## 2016-04-30 LAB — CBC
Hematocrit: 48.1 % (ref 37.5–51.0)
Hemoglobin: 16.3 g/dL (ref 13.0–17.7)
MCH: 29.6 pg (ref 26.6–33.0)
MCHC: 33.9 g/dL (ref 31.5–35.7)
MCV: 88 fL (ref 79–97)
PLATELETS: 225 10*3/uL (ref 150–379)
RBC: 5.5 x10E6/uL (ref 4.14–5.80)
RDW: 13.4 % (ref 12.3–15.4)
WBC: 6.6 10*3/uL (ref 3.4–10.8)

## 2016-04-30 LAB — LIPID PANEL
CHOL/HDL RATIO: 3.4 ratio (ref 0.0–5.0)
CHOLESTEROL TOTAL: 147 mg/dL (ref 100–199)
HDL: 43 mg/dL (ref >39)
LDL Calculated: 74 mg/dL (ref 0–99)
TRIGLYCERIDES: 151 mg/dL — AB (ref 0–149)
VLDL Cholesterol Cal: 30 mg/dL (ref 5–40)

## 2016-04-30 LAB — TSH: TSH: 3.82 u[IU]/mL (ref 0.450–4.500)

## 2016-05-05 ENCOUNTER — Encounter: Payer: Self-pay | Admitting: Cardiovascular Disease

## 2016-05-13 ENCOUNTER — Ambulatory Visit (INDEPENDENT_AMBULATORY_CARE_PROVIDER_SITE_OTHER): Payer: Medicare Other | Admitting: Cardiovascular Disease

## 2016-05-13 ENCOUNTER — Encounter: Payer: Self-pay | Admitting: Cardiovascular Disease

## 2016-05-13 VITALS — BP 104/80 | HR 63 | Ht 69.0 in | Wt 199.0 lb

## 2016-05-13 DIAGNOSIS — I2510 Atherosclerotic heart disease of native coronary artery without angina pectoris: Secondary | ICD-10-CM | POA: Diagnosis not present

## 2016-05-13 DIAGNOSIS — I700 Atherosclerosis of aorta: Secondary | ICD-10-CM | POA: Diagnosis not present

## 2016-05-13 DIAGNOSIS — E782 Mixed hyperlipidemia: Secondary | ICD-10-CM

## 2016-05-13 DIAGNOSIS — I10 Essential (primary) hypertension: Secondary | ICD-10-CM | POA: Diagnosis not present

## 2016-05-13 DIAGNOSIS — N183 Chronic kidney disease, stage 3 unspecified: Secondary | ICD-10-CM

## 2016-05-13 DIAGNOSIS — Z79899 Other long term (current) drug therapy: Secondary | ICD-10-CM

## 2016-05-13 MED ORDER — AMLODIPINE BESYLATE 10 MG PO TABS
10.0000 mg | ORAL_TABLET | Freq: Every day | ORAL | 3 refills | Status: DC
Start: 2016-05-13 — End: 2017-06-25

## 2016-05-13 MED ORDER — PITAVASTATIN CALCIUM 4 MG PO TABS: 1.0000 | ORAL_TABLET | Freq: Every day | ORAL | 3 refills | Status: DC

## 2016-05-13 MED ORDER — EZETIMIBE 10 MG PO TABS: 10.0000 mg | ORAL_TABLET | Freq: Every day | ORAL | 3 refills | Status: DC

## 2016-05-13 MED ORDER — ISOSORBIDE MONONITRATE ER 30 MG PO TB24: 15.0000 mg | ORAL_TABLET | Freq: Every day | ORAL | 3 refills | Status: DC

## 2016-05-13 MED ORDER — ATENOLOL 25 MG PO TABS: ORAL_TABLET | ORAL | 3 refills | Status: DC

## 2016-05-13 MED ORDER — CLOPIDOGREL BISULFATE 75 MG PO TABS: 75.0000 mg | ORAL_TABLET | Freq: Every day | ORAL | 3 refills | Status: DC

## 2016-05-13 MED ORDER — VALSARTAN 80 MG PO TABS: 80.0000 mg | ORAL_TABLET | Freq: Every day | ORAL | 3 refills | Status: DC

## 2016-05-13 NOTE — Progress Notes (Signed)
Patient ID: Nicholas Douglas, male   DOB: 02-02-1932, 83 y.o.   MRN: [id number]     HPI: Nicholas Douglas is an 83 y.o. Caucasian male who presents to the office today for a 7 month evaluation.  Nicholas Douglas has known CAD and underwent numerous interventions dating back to 5, 1991, 1993, and in 1997. Cardiac catheterization in 2011 which showed preserved LV function with mild residual distal inferior apical hypocontractility. There is evidence for coronary calcification with segmental narrowing of his LAD of 30-40% proximally, 50% diffusely, in the midsegment, and 70-80% in the distal region, he had AV groove circumflex stenoses of 70 and 50% with a 40% OM 2 stenosis, and a 30-40 and 50% RCA stenoses with 80-90% stenosis in the acute marginal branch. He had been on medical therapy.  He  presented to [facility] in March 2015 with class IV angina and catheterization demonstrated severe 2 vessel CAD with significant current calcification of the LAD with up to 95% mid LAD stenosis and diffuse proximal stenosis. He underwent successful high-speed rotational atherectomy the following day by Dr. Martinique and a 1.5 mm bur was used. Mid LAD was stented with a 2.75x38 mm Promus stent in the proximal LAD was stented with a 3.0x28 mm Promus stent. Medical therapy was recommended for his distal LAD stenosis. He has only one kidney and staged intervention to the left circumflex coronary artery was recommended.  He underwent staged left circumflex PCI on 04/27/2013 by me with successful high-speed rotational atherectomy with a 1.5 and 1.75 mm burr, and ultimately had a 2.5x28 mm Promus premier DES stent inserted into the circumflex vessel, which was post dilated to approximately 2.6 mm.  Subsequently he has felt significantly improved with resolution of any chest pain  He  has a history of significant hyperlipidemia and has had significant increased number of small LDL particles and insulin resistance. This seemed to markedly  improve with the addition of Niaspan added to zetia and lovaza.  Remotely, he had been on statins consisting of Lipitor and subsequently Crestor. He did have transient LFT elevation. He also concerns of possible risk of developing dementia with statin therapy.  He had  some episodes of Niaspan induced diffuse flushing. He wanted to stop taking his Niaspan. He did have subsequent NMR off Niaspan and done just on Zetia and as well as 2 g of Lovaza.  This showed increased abnormalities such that his total cholesterol was 201, LDL had risen to 124, but he now had LDL small particles which have increased from 685 to 1348. Laboratory on 10/26/2012: LDL particle number was elevated at 1657, LDL cholesterol 112 triglycerides 155 and total cholesterol 190. He continued to be insulin resistant with insulin resistance scored 65. When I last saw him, we elected to try Livalo and ultimately titrated this to 4 mg daily to take in addition to his Zetia 10 mg. Laboratory on 02/22/2013: LDL particle number was  markedly improved at 774 with an HDL of 40 calculated LDL 50 small LDL particle #417. Insulin resistance score was also improved at 51.  In July 2015 he developed abdominal discomfort and was found to have acute appendicitis. On CT imaging he was also noted to have prostate enlargement with nodularity and thickening of the bladder base and cystoscopy was suggested for further evaluation. He is status post left nephrectomy. He also was noted to have a 2.3 cm infrarenal suprailiac abdominal aortic aneurysm.  He has been on Livalo 2 mg, Zetia  10 mg for hyperlipidemia.  Lab work on 12/22/2013 showed a cholesterol 155, triglycerides 130, HDL 45, LDL 84.  He has been on amlodipine 10 mg atenolol 12.5 mg twice a day and valsartan 80 mg daily for blood pressure.  He continues to be on aspirin and Plavix for dual antiplatelet therapy.  A nuclear perfusion study on 06/30/2014  revealed normal perfusion and function with an ejection  fraction of 57%.    When he underwent a CT scan for his appendicitis he was told of having a small abdominal aortic aneurysm.  His mother also had an abdominal aortic aneurysm.  The patient recently sprained his right ankle.  As result, he has not been able to be as active as he had in the past and has not been able to play golf which typically he had played up to 4 times per week, often scoring in the 70s.    He was recently admitted to the hospital in April 2017 with complaints of increasing episodes of chest discomfort.  Troponins were mildly positive at 0.12, consistent with a non-STEMI.  I performed cardiac catheterization on 05/28/2015.  This showed widely patent stents in the LAD and circumflex vessels.  The RCA was diffusely diseased with calcification in the proximal to mid region with distal 99% stenosis and a noncalcified distal RCA segment immediately after the PDA takeoff.  ECI was difficult due to the calcified RCA preventing placement of a stent since the stent was never able to be passed beyond this calcified angled segment despite even attempting guide liner support.  Ultimately, he underwent successful PTCA of the 99% stenosis, which was reduced to 0%.  Since his intervention he has noticed dramatic improvement in his previous symptomatology.  He continues to feel well and is playing golf 3 times per week. He had a basal cell ca removed from his nose.   Since I last saw him, he has been without chest pain or shortness of breath.  He is still playing golf at least 3 days per week.  Recent laboratory showed a total cholesterol 147, triglycerides 151, HDL 43, LDL 74.  His current regimen of Zetia 10 mg and Livalo 2 mg.  In the past he's been intolerant to Crestor, Lipitor, and Zocor.  In April 2017.  A follow-up abdominal ultrasound showed aortoiliac atherosclerosis without aneurysm.  He denies chest pain or dyspnea.  He presents for follow-up cardiology evaluation.  Past Medical History:    Diagnosis Date  . BPH (benign prostatic hyperplasia)    Followed by [name]/urology  . CAD S/P PTCA only of RPAV-PL 05/31/2015   99% --> 0%PAV - PTCA only (unable to advance STENT) due to RCA calcification - prox & distal RCA 30%; Patent LAD stent & Cx stent (~20% ISR).   . Calculus of kidney    "that's how they found the cancer" (04/27/2013)  . Coronary artery disease involving native coronary artery of native heart with unstable angina pectoris ([place]) 12/03/2011   S/P Cardiac angioplasty 1986, 1991, 1993, 1997.  Last cardiac catheterization 2001.  Cardiolite 05/2011 low risk with normal EF 65%.  Followed by cardiology/[facility]ich[facility] every six months.   . History of myocardial infarction 1976  . Hypertension   . Impotence of organic origin   . Internal hemorrhoids without mention of complication   . Malignant neoplasm of kidney, except pelvis   . Melanoma ([place]) 06/2010   Left arm  . 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  . Other abnormal blood chemistry   . Other nonspecific abnormal serum enzyme levels   . Personal history of colonic polyps   . Pure hypercholesterolemia   . Tobacco use disorder   . Unspecified congenital cystic kidney disease   . Unstable angina ([place]) 04/05/2013; 05/2015    Past Surgical History:  Procedure Laterality Date  . APPENDECTOMY  2014  . CARDIAC CATHETERIZATION N/A 05/30/2015   Procedure: Left Heart Cath and Coronary Angiography;  Surgeon: Nicholas Sine, MD;  Location: [facility];  Service: Cardiovascular;  Laterality: N/A;  . CARDIAC CATHETERIZATION N/A 05/30/2015   Procedure: Coronary Balloon Angioplasty;  Surgeon: Nicholas Sine, MD;  Location: [facility];  Service: Cardiovascular;  Laterality: N/A;  . Cardiolite  05/06/2011   low risk study; normal EF.  [facility]  . carotid dopplers  03/08/2011   minimal plaque formation B. Symptoms: dizziness.  . COLONOSCOPY  10/05/2008   single polyp, [facility].  [name].  Repeat in 3  years.  . CORONARY ANGIOPLASTY     "I've had 4" (04/27/2013)  . CORONARY ANGIOPLASTY WITH STENT PLACEMENT  04/2013; 04/27/2013   "2 + 1" (04/27/2013)  . EYE SURGERY  11/04/2013   Cataract surgery B; [facility].  Marland Kitchen MELANOMA EXCISION Left ~ 2007   "arm"  . MELANOMA EXCISION  02/07/2015   R upper back. [facility]  . NEPHRECTOMY Left 2006  . PERCUTANEOUS CORONARY ROTOBLATOR INTERVENTION (PCI-R) N/A 04/06/2013   Procedure: PERCUTANEOUS CORONARY ROTOBLATOR INTERVENTION (PCI-R);  Surgeon: [name], MD;  Location[facility];  Service: Cardiovascular;  Laterality: N/A;  . PERCUTANEOUS CORONARY STENT INTERVENTION (PCI-S) N/A 04/27/2013   Procedure: PERCUTANEOUS CORONARY STENT INTERVENTION (PCI-S);  Surgeon: Nicholas Sine, MD;  Location: [facility] CATH LAB;  Service: Cardiovascular;  Laterality: N/A;    Allergies  Allergen Reactions  . Imdur [Isosorbide Dinitrate]   . Lovastatin Other (See Comments)    Elevated liver enzymes  . Morphine Hives  . Morphine And Related Hives  . Nifedipine Other (See Comments)    Elevated liver enzymes    Current Outpatient Prescriptions  Medication Sig Dispense Refill  . acetaminophen (TYLENOL) 500 MG tablet Take 500 mg by mouth daily as needed for moderate pain.    Marland Kitchen amLODipine (NORVASC) 10 MG tablet Take 1 tablet (10 mg total) by mouth daily. 90 tablet 3  . aspirin 81 MG tablet Take 81 mg by mouth at bedtime.     Marland Kitchen atenolol (TENORMIN) 25 MG tablet TAKE ONE-HALF (1/2) TABLET TWICE A DAY 90 tablet 3  . clopidogrel (PLAVIX) 75 MG tablet Take 1 tablet (75 mg total) by mouth daily. 90 tablet 3  . ezetimibe (ZETIA) 10 MG tablet Take 1 tablet (10 mg total) by mouth daily. 90 tablet 3  . finasteride (PROSCAR) 5 MG tablet Take 1 tablet (5 mg total) by mouth daily. (Patient taking differently: Take 2.5 mg by mouth daily. ) 90 tablet 3  . Icosapent Ethyl 1 g CAPS Take 1 g by mouth 2 (two) times daily. 180 capsule 1  . isosorbide mononitrate (IMDUR) 30 MG 24 hr tablet Take 0.5 tablets  (15 mg total) by mouth daily. 45 tablet 3  . latanoprost (XALATAN) 0.005 % ophthalmic solution Place 1 drop into both eyes at bedtime.  1  . nitroGLYCERIN (NITROSTAT) 0.4 MG SL tablet Place 1 tablet (0.4 mg total) under the tongue every 5 (five) minutes as needed for chest pain. 25 tablet 2  .  traZODone (DESYREL) 50 MG tablet Take 50 mg by mouth at bedtime as needed for sleep.   2  . valsartan (DIOVAN) 80 MG tablet Take 1 tablet (80 mg total) by mouth daily. 90 tablet 3  . Pitavastatin Calcium (LIVALO) 4 MG TABS Take 1 tablet (4 mg total) by mouth daily. 90 tablet 3   No current facility-administered medications for this visit.     Socially he remains active. He is married has 5 children 10 grandchildren 2 great-grandchildren. Is no alcohol use. He typically scores below 75 and plays golf 4 days per week.  He has turned 82 any typically scores in the 70s, better than his age.  ROS General: Negative; No fevers, chills, or night sweats;  HEENT: Negative; No changes in vision or hearing, sinus congestion, difficulty swallowing Pulmonary: Negative; No cough, wheezing, shortness of breath, hemoptysis Cardiovascular: See history of present illness;  GI: Negative; No nausea, vomiting, diarrhea, or abdominal pain GU: Negative; No dysuria, hematuria, or difficulty voiding Musculoskeletal: Negative; no myalgias, joint pain, or weakness Hematologic/Oncology: Negative; no easy bruising, bleeding Endocrine: Negative; no heat/cold intolerance; no diabetes Neuro: Negative; no changes in balance, headaches Skin: Negative; No rashes or skin lesions Psychiatric: Negative; No behavioral problems, depression Sleep: Negative; No snoring, daytime sleepiness, hypersomnolence, bruxism, restless legs, hypnogognic hallucinations, no cataplexy Other comprehensive 14 point system review is negative.   PE BP 104/80 (BP Location: Left Arm, Patient Position: Sitting, Cuff Size: Normal)   Pulse 63   Ht '5\' 9"'  (1.753  m)   Wt 199 lb (90.3 kg)   BMI 29.39 kg/m    Repeat blood pressure 102/74  Wt Readings from Last 3 Encounters:  05/13/16 199 lb (90.3 kg)  11/02/15 197 lb 6 oz (89.5 kg)  06/15/15 194 lb 3.2 oz (88.1 kg)   General: Alert, oriented, no distress.  Skin: normal turgor, no rashes HEENT: Normocephalic, atraumatic. Pupils round and reactive; sclera anicteric;no lid lag.  Nose without nasal septal hypertrophy Mouth/Parynx benign; Mallinpatti scale 2 Neck: No JVD, no carotid bruits; normal carotid up stroke Lungs: clear to ausculatation and percussion; no wheezing or rales Chest wall: Nontender to palpation  Heart: RRR, s1 s2 normal 1/6 sem; no diastolic murmur. No rubs, thrills, or heaves. Abdomen: soft, nontender; no hepatosplenomehaly, BS+; abdominal aorta nontender and not significantly dilated by palpation. Back: No CVA tenderness. Pulses 2+; catheterization site well-healed Extremities: Right ankle in an ankle brace; no clubbing cyanosis or edema, Homan's sign negative  Neurologic: grossly nonfocal Psychological: Normal affect and mood  ECG (independently read by me): Normal sinus rhythm at 63 bpm.  Normal intervals.  No ST segment changes.  September 2017 ECG (independently read by me): Normal sinus rhythm at 60 bpm.  Nonspecific T changes.  Intervals are normal.  May 2017 ECG (independently read by me): Sinus bradycardia 55 bpm.  No ectopy.  Normal intervals.  Nondiagnostic T changes in lead 3.  April 2017 ECG (independently read by me): Sinus bradycardia 55 bpm.  No ectopy.  No significant ST changes.  Normal intervals.  01/12/2015 ECG (independently read by me): Sinus bradycardia 54 bpm.  No ectopy.  Normal intervals.  June 2016 ECG (independently read by me): Sinus bradycardia 58 bpm.  Normal intervals.  No ectopy. Non-specific T change aVL  November 2015 ECG (independently read by me): Sinus bradycardia 54 bpm.  No significant ST-T changes.  Normal intervals.  May 2015  ECG (independently read by me): Sinus bradycardia 54 beats per minute.  No  ectopy.  QTc interval 396 ms.  No significant ST changes.  04/22/2013 ECG (independently read by me): Sinus bradycardia 55 beats per minute. Nonspecific ST changes  03/01/2013 ECG (independently read by me): Sinus bradycardia 52 beats per minute. Normal intervals. No significant ST changes.  Prior ECG of 11/20/2012: Sinus rhythm at 51 beats per minute. QTc interval 400 ms. No significant ST changes.  LABS:  BMP Latest Ref Rng & Units 04/29/2016 07/10/2015 05/31/2015  Glucose 65 - 99 mg/dL 106(H) 109(H) 96  BUN 8 - 27 mg/dL '14 13 9  ' Creatinine 0.76 - 1.27 mg/dL 1.29(H) 1.11 0.98  BUN/Creat Ratio 10 - '24 11 12 ' -  Sodium 134 - 144 mmol/L 139 140 138  Potassium 3.5 - 5.2 mmol/L 5.2 5.0 4.3  Chloride 96 - 106 mmol/L 100 101 108  CO2 18 - 29 mmol/L 28 23 20(L)  Calcium 8.6 - 10.2 mg/dL 9.6 9.4 8.8(L)   Hepatic Function Latest Ref Rng & Units 04/29/2016 07/10/2015 05/29/2015  Total Protein 6.0 - 8.5 g/dL 7.1 7.3 6.2(L)  Albumin 3.5 - 4.7 g/dL 4.4 4.5 3.4(L)  AST 0 - 40 IU/L 34 41(H) 33  ALT 0 - 44 IU/L 37 43 36  Alk Phosphatase 39 - 117 IU/L 58 59 41  Total Bilirubin 0.0 - 1.2 mg/dL 0.8 1.0 0.7  Bilirubin, Direct 0.00 - 0.40 mg/dL - - -   CBC Latest Ref Rng & Units 04/29/2016 07/10/2015 05/31/2015  WBC 3.4 - 10.8 x10E3/uL 6.6 6.4 6.5  Hemoglobin 13.0 - 17.0 g/dL - - 14.1  Hematocrit 37.5 - 51.0 % 48.1 46.5 41.6  Platelets 150 - 379 x10E3/uL 225 236 194   Lab Results  Component Value Date   MCV 88 04/29/2016   MCV 86 07/10/2015   MCV 86.8 05/31/2015   Lab Results  Component Value Date   TSH 3.820 04/29/2016  .  Lipid Panel     Component Value Date/Time   CHOL 147 04/29/2016 0805   TRIG 151 (H) 04/29/2016 0805   TRIG 131 02/22/2013 0804   HDL 43 04/29/2016 0805   HDL 40 02/22/2013 0804   CHOLHDL 3.4 04/29/2016 0805   CHOLHDL 3.5 05/09/2015 1400   VLDL 31 (H) 05/09/2015 1400   LDLCALC 74 04/29/2016 0805    LDLCALC 50 02/22/2013 0804   RADIOLOGY: Dg Chest 2 View  08/17/2012   *RADIOLOGY REPORT*  Clinical Data: Renal cell carcinoma  CHEST - 2 VIEW  Comparison: 05/08/11  Findings: The cardiomediastinal silhouette is stable.  No acute infiltrate or pleural effusion.  No pulmonary edema.  Bony thorax is unremarkable.  IMPRESSION: No active disease.  No significant change.   Original Report Authenticated By: Lahoma Crocker, M.D.    IMPRESSION:  1. Coronary artery disease involving native coronary artery of native heart without angina pectoris   2. Essential hypertension, benign   3. Mixed hyperlipidemia   4. Atherosclerosis of aorta ([place])   5. Medication management   6. Stage III chronic kidney disease     ASSESSMENT AND PLAN: Mr. Jerad Dunlap is a young appearing 83 year old gentleman who has CAD dating back to 46 and has undergone multiple interventions in the past over a 10 year period from 82 to 1997. He developed unstable angina symptomatology in March 2015 and catheterization done initially at [facility] showed multivessel disease.  He underwent successful staged rotational coronary atherectomy initially involving the RCA in early March, and then on 04/27/2013 repeat intervention was done to the left circumflex system with  rotational atherectomy.  At that time, he had a widely patent stent of his RCA, as well as a widely patent stent in his LAD.  He also had 60-70% mid LAD stenosis, which had not progressed.  When I saw him earlier this year, he had noticed a change in symptomatology with the development of more shortness of breath, and no energy.  He also has noticed occasional episodes of vague chest discomfort with possible left arm radiation.  He was hospitalized in late April 2017 and was found to have progressive 99% distal RCA stenosis after the PDA takeoff.  His stents in the LAD and circumflex are widely patent.  There was mild concomitant CAD in the circumflex.  His RCA was diffusely diseased  and calcified with narrowings of 30% of the mid segment, but there was an angulated area of narrowing calcified narrowing of 40% before the acute margin, which prevented a stent from getting beyond this.  He underwent successful PTCA  to the distal stenosis.  He has been without anginal symptoms since his last intervention.  His blood pressure today is stable on amlodipine 10 mg, isosorbide 30 mg, valsartan 80 mg and atenolol 12.5 mg twice a day.  He continues to be on dual antiplatelet therapy and is without bleeding.  His recent blood work has shown an LDL cholesterol of 74.  He has tolerated livalo and I have suggested that this be increased to 4 mg to see if we can further reduce his LDL.  His LFTs are normal.  Recent serum creatinine was 1.29 and GFR calculated at 51, which is consistent with stage III chronic kidney disease.  As long as he remains stable, I will see him in 6 months for cardiology reevaluation.    Time spent: 25 minutes  Nicholas Sine, MD, [facility]  05/13/2016 10:30 AM

## 2016-05-13 NOTE — Patient Instructions (Signed)
Your physician recommends that you return for lab work in: 3 months.  Your physician has recommended you make the following change in your medication:   1.) the livalo has been increased from 2 mg to 4 mg daily. A new prescription has been sent to your mail order pharmacy.  Your physician wants you to follow-up in: 6 months or sooner if needed. You will receive a reminder letter in the mail two months in advance. If you don't receive a letter, please call our office to schedule the follow-up appointment.

## 2016-07-19 ENCOUNTER — Telehealth: Payer: Self-pay | Admitting: Cardiovascular Disease

## 2016-07-19 NOTE — Telephone Encounter (Signed)
Spoke with pt he states that he has been getting increasingly SOB for the last couple days/weeks. Appt scheduled w/Dr [name] 07-22-16.

## 2016-07-19 NOTE — Telephone Encounter (Signed)
New message  Pt c/o Shortness Of Breath: STAT if SOB developed within the last 24 hours or pt is noticeably SOB on the phone  1. Are you currently SOB (can you hear that pt is SOB on the phone)? no  2. How long have you been experiencing SOB? For about a week  3. Are you SOB when sitting or when up moving around? little movement   4. Are you currently experiencing any other symptoms? No  Pt appt make for 6/21 w/ Nicholas Douglas

## 2016-07-20 IMAGING — DX DG CHEST 2V
2 series · 2 of 2 positions shown · non-contrast
Comparison: Chest radiograph performed 04/20/2015

CLINICAL DATA: Chronic mid chest tightness, fatigue and shortness
of breath. Initial encounter.

EXAM:
CHEST  2 VIEW

[chest pa]
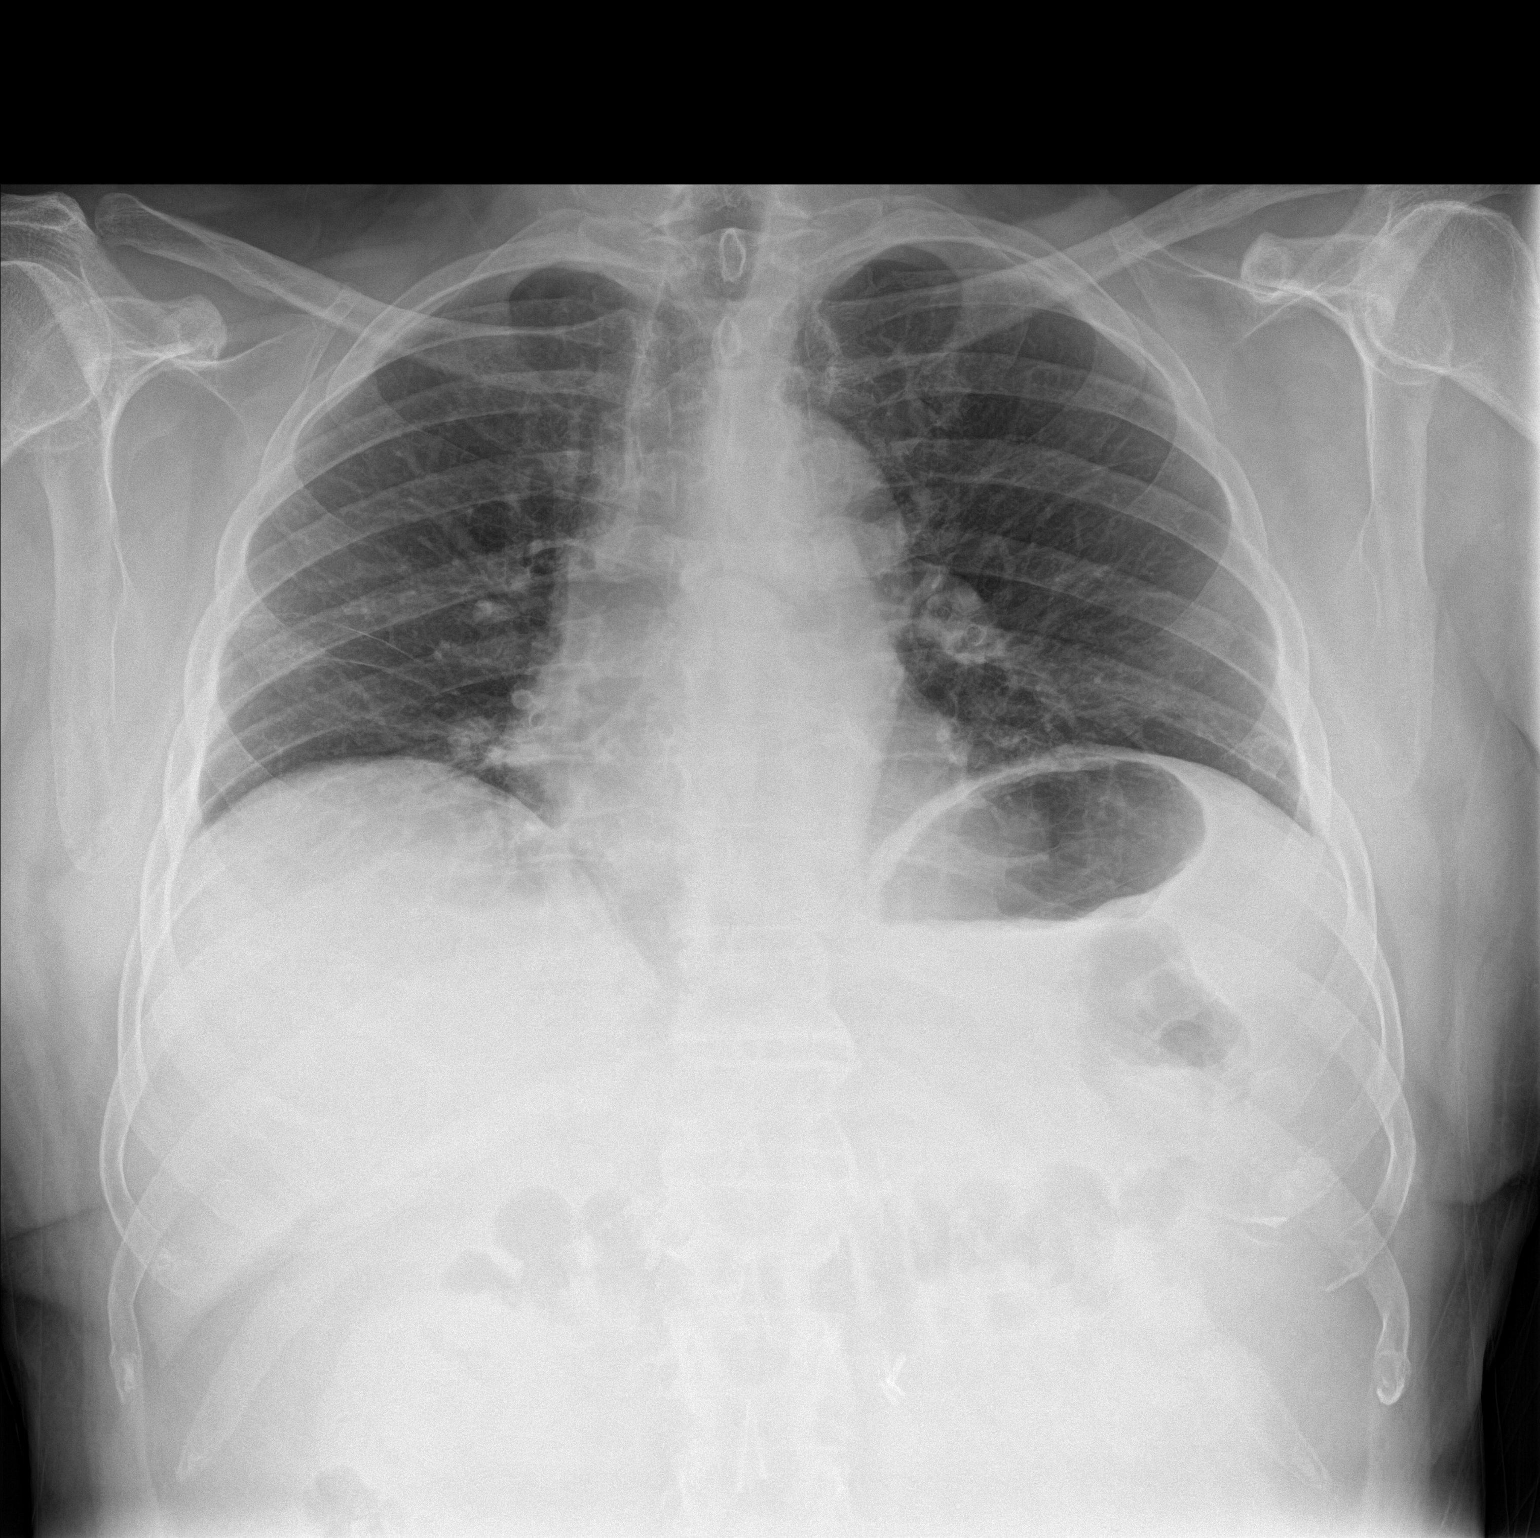

[chest lat]
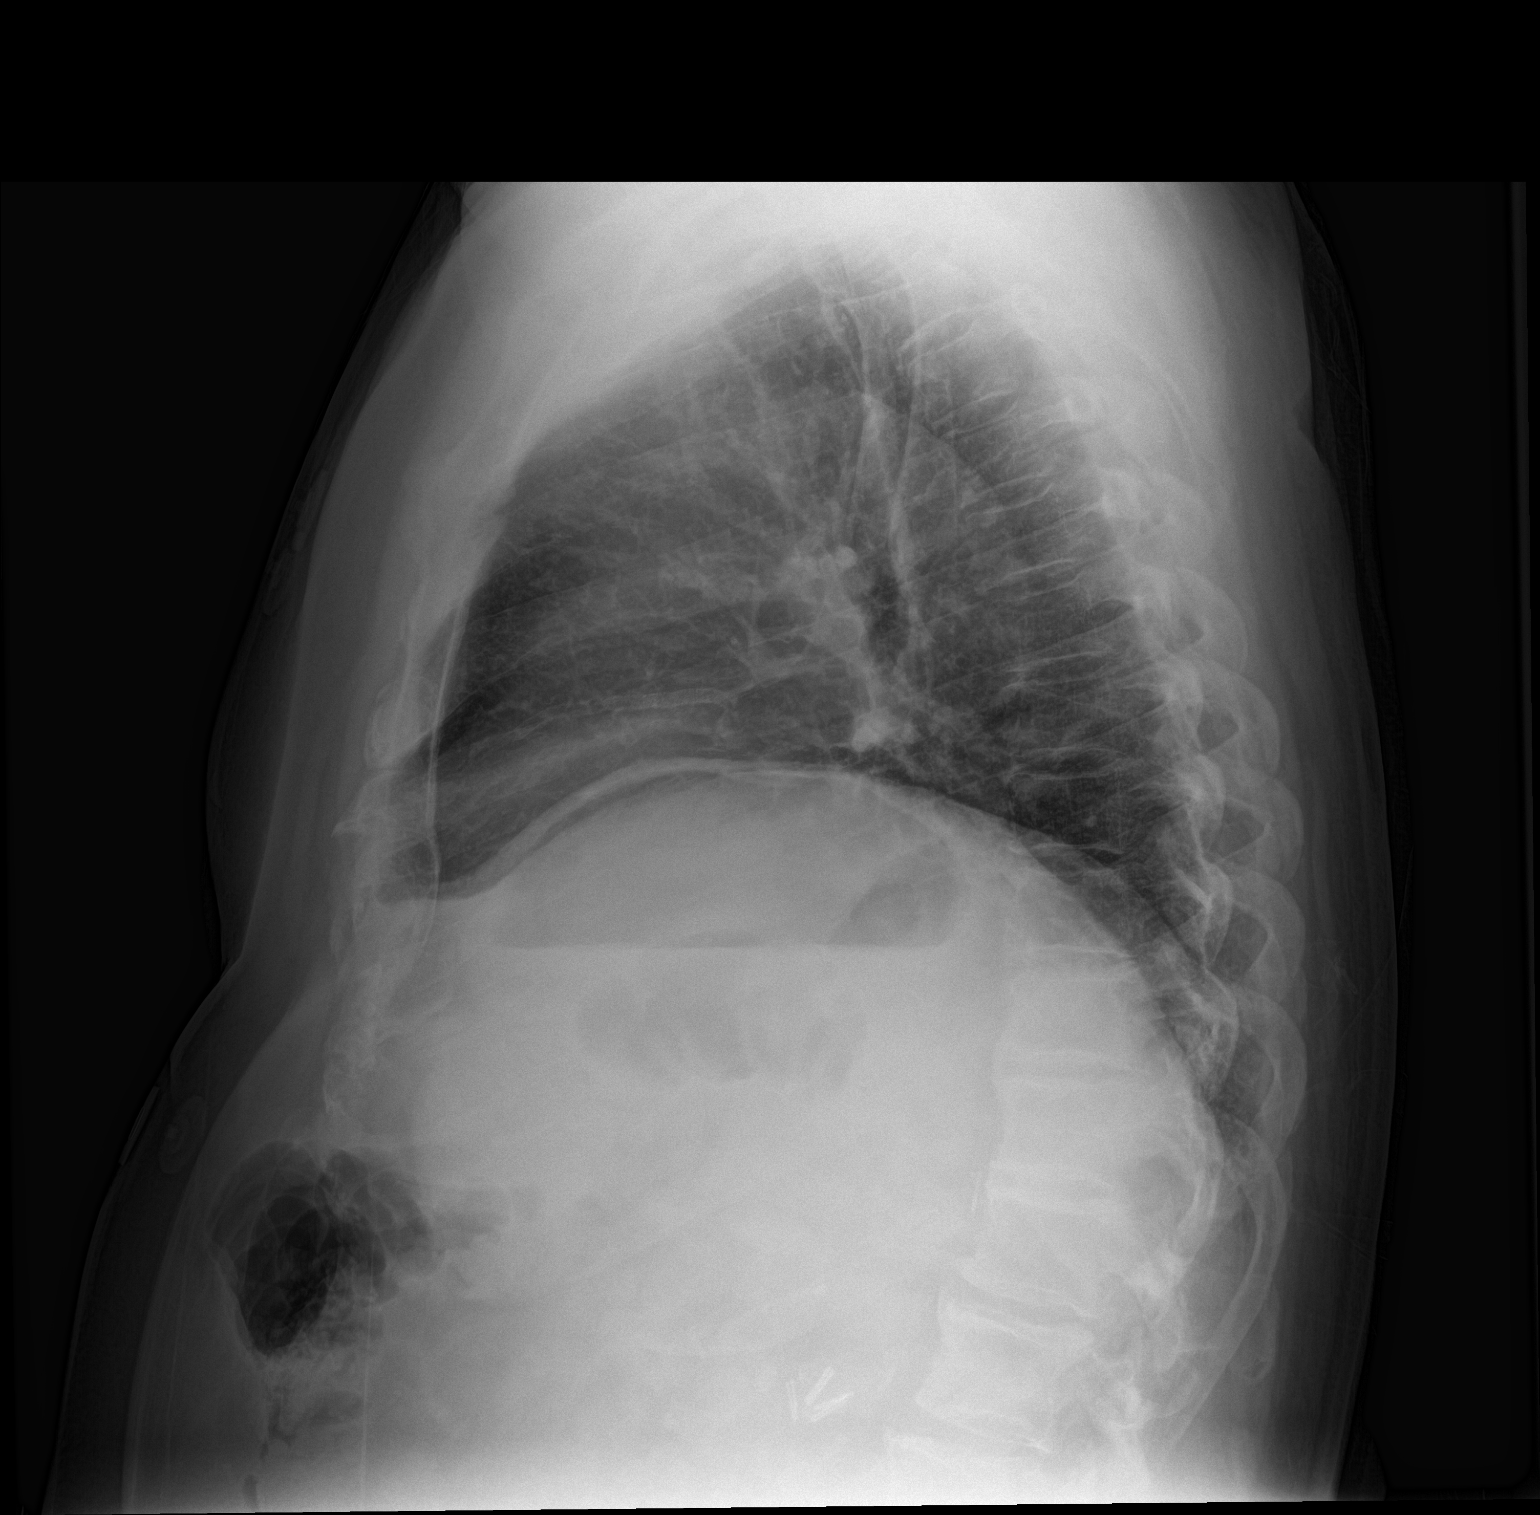

[2 of 2 positions shown; findings below may reference images not displayed]

FINDINGS: The lungs are hypoexpanded. Chronic peribronchial thickening is
noted. No pleural effusion or pneumothorax is seen.

The heart is normal in size; the mediastinal contour is within
normal limits. No acute osseous abnormalities are seen. Clips are
noted within the right upper quadrant, reflecting prior
cholecystectomy.
IMPRESSION: Lungs hypoexpanded. Chronic peribronchial thickening noted. Lungs
otherwise grossly clear.

## 2016-07-22 ENCOUNTER — Encounter: Payer: Self-pay | Admitting: Cardiovascular Disease

## 2016-07-22 ENCOUNTER — Ambulatory Visit (INDEPENDENT_AMBULATORY_CARE_PROVIDER_SITE_OTHER): Payer: Medicare Other | Admitting: Cardiovascular Disease

## 2016-07-22 ENCOUNTER — Ambulatory Visit (HOSPITAL_COMMUNITY)
Admission: RE | Admit: 2016-07-22 | Discharge: 2016-07-22 | Disposition: A | Discharge status: Home / Self Care | Payer: Medicare Other | Source: Ambulatory Visit | Attending: Cardiovascular Disease | Admitting: Cardiovascular Disease

## 2016-07-22 VITALS — BP 148/86 | HR 58 | Ht 67.0 in | Wt 196.0 lb

## 2016-07-22 DIAGNOSIS — R918 Other nonspecific abnormal finding of lung field: Secondary | ICD-10-CM | POA: Diagnosis not present

## 2016-07-22 DIAGNOSIS — E782 Mixed hyperlipidemia: Secondary | ICD-10-CM

## 2016-07-22 DIAGNOSIS — N183 Chronic kidney disease, stage 3 unspecified: Secondary | ICD-10-CM

## 2016-07-22 DIAGNOSIS — J9811 Atelectasis: Secondary | ICD-10-CM | POA: Insufficient documentation

## 2016-07-22 DIAGNOSIS — I2510 Atherosclerotic heart disease of native coronary artery without angina pectoris: Secondary | ICD-10-CM | POA: Diagnosis not present

## 2016-07-22 DIAGNOSIS — R05 Cough: Secondary | ICD-10-CM | POA: Insufficient documentation

## 2016-07-22 DIAGNOSIS — I700 Atherosclerosis of aorta: Secondary | ICD-10-CM

## 2016-07-22 DIAGNOSIS — R0602 Shortness of breath: Secondary | ICD-10-CM | POA: Insufficient documentation

## 2016-07-22 DIAGNOSIS — I10 Essential (primary) hypertension: Secondary | ICD-10-CM

## 2016-07-22 MED ORDER — VALSARTAN 160 MG PO TABS: 160.0000 mg | ORAL_TABLET | Freq: Every day | ORAL | 3 refills | Status: DC

## 2016-07-22 MED ORDER — ISOSORBIDE MONONITRATE ER 60 MG PO TB24: 60.0000 mg | ORAL_TABLET | Freq: Every day | ORAL | 3 refills | Status: DC

## 2016-07-22 NOTE — Progress Notes (Signed)
Patient ID: Nicholas Douglas, male   DOB: 10/14/1931, 83 y.o.   MRN: [id number]     HPI: DEANGELO BERNS is an 84 y.o. Caucasian male who presents to the office today for a 2 month evaluation.  Mr. Cottrill has known CAD and underwent numerous interventions dating back to 80, 1991, 1993, and in 1997. Cardiac catheterization in 2011 which showed preserved LV function with mild residual distal inferior apical hypocontractility. There is evidence for coronary calcification with segmental narrowing of his LAD of 30-40% proximally, 50% diffusely, in the midsegment, and 70-80% in the distal region, he had AV groove circumflex stenoses of 70 and 50% with a 40% OM 2 stenosis, and a 30-40 and 50% RCA stenoses with 80-90% stenosis in the acute marginal branch. He had been on medical therapy.  He  presented to [facility] in March 2015 with class IV angina and catheterization demonstrated severe 2 vessel CAD with significant current calcification of the LAD with up to 95% mid LAD stenosis and diffuse proximal stenosis. He underwent successful high-speed rotational atherectomy the following day by Dr. Martinique and a 1.5 mm bur was used. Mid LAD was stented with a 2.75x38 mm Promus stent in the proximal LAD was stented with a 3.0x28 mm Promus stent. Medical therapy was recommended for his distal LAD stenosis. He has only one kidney and staged intervention to the left circumflex coronary artery was recommended.  He underwent staged left circumflex PCI on 04/27/2013 by me with successful high-speed rotational atherectomy with a 1.5 and 1.75 mm burr, and ultimately had a 2.5x28 mm Promus premier DES stent inserted into the circumflex vessel, which was post dilated to approximately 2.6 mm.  Subsequently he has felt significantly improved with resolution of any chest pain  He  has a history of significant hyperlipidemia and has had significant increased number of small LDL particles and insulin resistance. This seemed to markedly  improve with the addition of Niaspan added to zetia and lovaza.  Remotely, he had been on statins consisting of Lipitor and subsequently Crestor. He did have transient LFT elevation. He also concerns of possible risk of developing dementia with statin therapy.  He had  some episodes of Niaspan induced diffuse flushing. He wanted to stop taking his Niaspan. He did have subsequent NMR off Niaspan and done just on Zetia and as well as 2 g of Lovaza.  This showed increased abnormalities such that his total cholesterol was 201, LDL had risen to 124, but he now had LDL small particles which have increased from 685 to 1348. Laboratory on 10/26/2012: LDL particle number was elevated at 1657, LDL cholesterol 112 triglycerides 155 and total cholesterol 190. He continued to be insulin resistant with insulin resistance scored 65. When I last saw him, we elected to try Livalo and ultimately titrated this to 4 mg daily to take in addition to his Zetia 10 mg. Laboratory on 02/22/2013: LDL particle number was  markedly improved at 774 with an HDL of 40 calculated LDL 50 small LDL particle #417. Insulin resistance score was also improved at 51.  In July 2015 he developed abdominal discomfort and was found to have acute appendicitis. On CT imaging he was also noted to have prostate enlargement with nodularity and thickening of the bladder base and cystoscopy was suggested for further evaluation. He is status post left nephrectomy. He also was noted to have a 2.3 cm infrarenal suprailiac abdominal aortic aneurysm.  He has been on Livalo 2 mg, Zetia  10 mg for hyperlipidemia.  Lab work on 12/22/2013 showed a cholesterol 155, triglycerides 130, HDL 45, LDL 84.  He has been on amlodipine 10 mg atenolol 12.5 mg twice a day and valsartan 80 mg daily for blood pressure.  He continues to be on aspirin and Plavix for dual antiplatelet therapy.  A nuclear perfusion study on 06/30/2014  revealed normal perfusion and function with an ejection  fraction of 57%.    When he underwent a CT scan for his appendicitis he was told of having a small abdominal aortic aneurysm.  His mother also had an abdominal aortic aneurysm.  The patient recently sprained his right ankle.  As result, he has not been able to be as active as he had in the past and has not been able to play golf which typically he had played up to 4 times per week, often scoring in the 70s.    He was recently admitted to the hospital in April 2017 with complaints of increasing episodes of chest discomfort.  Troponins were mildly positive at 0.12, consistent with a non-STEMI.  I performed cardiac catheterization on 05/28/2015.  This showed widely patent stents in the LAD and circumflex vessels.  The RCA was diffusely diseased with calcification in the proximal to mid region with distal 99% stenosis and a noncalcified distal RCA segment immediately after the PDA takeoff.  ECI was difficult due to the calcified RCA preventing placement of a stent since the stent was never able to be passed beyond this calcified angled segment despite even attempting guide liner support.  Ultimately, he underwent successful PTCA of the 99% stenosis, which was reduced to 0%.  Since his intervention he has noticed dramatic improvement in his previous symptomatology.  He continues to feel well and is playing golf 3 times per week. He had a basal cell cancer removed from his nose.   When I last saw him, he is without chest pain or significant shortness of breath and was playing golf at least 3 days per week.  Laboratory showed a total cholesterol 147, triglycerides 151, HDL 43, LDL 74 on regimen of Zetia 10 mg and Livalo 2 mg.  In the past he's been intolerant to Crestor, Lipitor, and Zocor.  I further titrated Livalo 24 mg daily which  he has tolerated. An abdominal ultrasound showed aortoiliac atherosclerosis without aneurysm.   Since I last saw him, for the past 2-4 weeks he has noticed increasing shortness of  breath with walking.  He denies any of the chest tightness or pressure that he had experienced prior to his interventions.  At his last catheterization, he had extensive calcification in his RCA and PTCA alone was done since the stent was unable to reach the subtotal distal RCA.  He denies recurrent chest tightness.  He was questioning whether or not he could have had  pneumonia comtributing to his shortness of breath.  He presents for evaluation.  Past Medical History:  Diagnosis Date  . BPH (benign prostatic hyperplasia)    Followed by [name]/urology  . CAD S/P PTCA only of RPAV-PL 05/31/2015   99% --> 0%PAV - PTCA only (unable to advance STENT) due to RCA calcification - prox & distal RCA 30%; Patent LAD stent & Cx stent (~20% ISR).   . Calculus of kidney    "that's how they found the cancer" (04/27/2013)  . Coronary artery disease involving native coronary artery of native heart with unstable angina pectoris (HCC) 12/03/2011   S/P Cardiac angioplasty 1986,  [place].  Last cardiac catheterization 2001.  Cardiolite 05/2011 low risk with normal EF 65%.  Followed by cardiology/[facility] every six months.   . History of myocardial infarction 1976  . Hypertension   . Impotence of organic origin   . Internal hemorrhoids without mention of complication   . Malignant neoplasm of kidney, except pelvis   . Melanoma ([place]) 06/2010   Left arm  . 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  . Other abnormal blood chemistry   . Other nonspecific abnormal serum enzyme levels   . Personal history of colonic polyps   . Pure hypercholesterolemia   . Tobacco use disorder   . Unspecified congenital cystic kidney disease   . Unstable angina ([place]) 04/05/2013; 05/2015    Past Surgical History:  Procedure Laterality Date  . APPENDECTOMY  2014  . CARDIAC CATHETERIZATION N/A 05/30/2015   Procedure: Left Heart Cath and Coronary Angiography;  Surgeon: [name], MD;   Location: [facility];  Service: Cardiovascular;  Laterality: N/A;  . CARDIAC CATHETERIZATION N/A 05/30/2015   Procedure: Coronary Balloon Angioplasty;  Surgeon: [name], MD;  Location: [facility];  Service: Cardiovascular;  Laterality: N/A;  . Cardiolite  05/06/2011   low risk study; normal EF.  [facility]  . carotid dopplers  03/08/2011   minimal plaque formation B. Symptoms: dizziness.  . COLONOSCOPY  10/05/2008   single polyp, [facility].  [name].  Repeat in 3 years.  . CORONARY ANGIOPLASTY     "I've had 4" (04/27/2013)  . CORONARY ANGIOPLASTY WITH STENT PLACEMENT  04/2013; 04/27/2013   "2 + 1" (04/27/2013)  . EYE SURGERY  11/04/2013   Cataract surgery B; [place].  Marland Kitchen MELANOMA EXCISION Left ~ 2007   "arm"  . MELANOMA EXCISION  02/07/2015   R upper back. [facility]  . NEPHRECTOMY Left 2006  . PERCUTANEOUS CORONARY ROTOBLATOR INTERVENTION (PCI-R) N/A 04/06/2013   Procedure: PERCUTANEOUS CORONARY ROTOBLATOR INTERVENTION (PCI-R);  Surgeon: [name], MD;  Location[facility] C[facility];  Service: Cardiovascular;  Laterality: N/A;  . PERCUTANEOUS CORONARY STENT INTERVENTION (PCI-S) N/A 04/27/2013   Procedure: PERCUTANEOUS CORONARY STENT INTERVENTION (PCI-S);  Surgeon: [name], MD;  Location: [facility] C[facility] LAB;  Service: Cardiovascular;  Laterality: N/A;    Allergies  Allergen Reactions  . Imdur [Isosorbide Dinitrate]   . Lovastatin Other (See Comments)    Elevated liver enzymes  . Morphine Hives  . Morphine And Related Hives  . Nifedipine Other (See Comments)    Elevated liver enzymes    Current Outpatient Prescriptions  Medication Sig Dispense Refill  . acetaminophen (TYLENOL) 500 MG tablet Take 500 mg by mouth daily as needed for moderate pain.    Marland Kitchen amLODipine (NORVASC) 10 MG tablet Take 1 tablet (10 mg total) by mouth daily. 90 tablet 3  . aspirin 81 MG tablet Take 81 mg by mouth at bedtime.     Marland Kitchen atenolol (TENORMIN) 25 MG tablet TAKE ONE-HALF (1/2) TABLET TWICE A DAY 90 tablet 3  .  clopidogrel (PLAVIX) 75 MG tablet Take 1 tablet (75 mg total) by mouth daily. 90 tablet 3  . ezetimibe (ZETIA) 10 MG tablet Take 1 tablet (10 mg total) by mouth daily. 90 tablet 3  . finasteride (PROSCAR) 5 MG tablet Take 1 tablet (5 mg total) by mouth daily. (Patient taking differently: Take 2.5 mg by mouth daily. ) 90 tablet 3  . Icosapent Ethyl 1  g CAPS Take 1 g by mouth 2 (two) times daily. 180 capsule 1  . isosorbide mononitrate (IMDUR) 60 MG 24 hr tablet Take 1 tablet (60 mg total) by mouth daily. 90 tablet 3  . latanoprost (XALATAN) 0.005 % ophthalmic solution Place 1 drop into both eyes at bedtime.  1  . nitroGLYCERIN (NITROSTAT) 0.4 MG SL tablet Place 1 tablet (0.4 mg total) under the tongue every 5 (five) minutes as needed for chest pain. 25 tablet 2  . Pitavastatin Calcium (LIVALO) 4 MG TABS Take 1 tablet (4 mg total) by mouth daily. 90 tablet 3  . traZODone (DESYREL) 50 MG tablet Take 50 mg by mouth at bedtime as needed for sleep.   2  . valsartan (DIOVAN) 160 MG tablet Take 1 tablet (160 mg total) by mouth daily. 90 tablet 3   No current facility-administered medications for this visit.     Socially he remains active. He is married has 5 children 10 grandchildren 2 great-grandchildren. Is no alcohol use. He typically scores below 75 and plays golf 3 days per week.  He typically scores in the 70s, better than his age.  ROS General: Negative; No fevers, chills, or night sweats;  HEENT: Negative; No changes in vision or hearing, sinus congestion, difficulty swallowing Pulmonary: Positive for shortness of breath Cardiovascular: See history of present illness;  GI: Negative; No nausea, vomiting, diarrhea, or abdominal pain GU: Negative; No dysuria, hematuria, or difficulty voiding Musculoskeletal: Negative; no myalgias, joint pain, or weakness Hematologic/Oncology: Negative; no easy bruising, bleeding Endocrine: Negative; no heat/cold intolerance; no diabetes Neuro: Negative; no  changes in balance, headaches Skin: Negative; No rashes or skin lesions Psychiatric: Negative; No behavioral problems, depression Sleep: Negative; No snoring, daytime sleepiness, hypersomnolence, bruxism, restless legs, hypnogognic hallucinations, no cataplexy Other comprehensive 14 point system review is negative.   PE BP (!) 148/86   Pulse (!) 58   Ht '5\' 7"'$  (1.702 m)   Wt 196 lb (88.9 kg)   BMI 30.70 kg/m    Repeat blood pressure 150/84  Wt Readings from Last 3 Encounters:  07/22/16 196 lb (88.9 kg)  05/13/16 199 lb (90.3 kg)  11/02/15 197 lb 6 oz (89.5 kg)   General: Alert, oriented, no distress.  Skin: normal turgor, no rashes, warm and dry HEENT: Normocephalic, atraumatic. Pupils equal round and reactive to light; sclera anicteric; extraocular muscles intact;  Nose without nasal septal hypertrophy Mouth/Parynx benign; Mallinpatti scale 2 Neck: No JVD, no carotid bruits; normal carotid upstroke Lungs: clear to ausculatation and percussion; no wheezing or rales Chest wall: without tenderness to palpitation Heart: PMI not displaced, RRR, s1 s2 normal, 1/6 systolic murmur, no diastolic murmur, no rubs, gallops, thrills, or heaves Abdomen: soft, nontender; no hepatosplenomehaly, BS+; abdominal aorta nontender and not dilated by palpation. Back: no CVA tenderness Pulses 2+ Musculoskeletal: full range of motion, normal strength, no joint deformities Extremities: no clubbing cyanosis or edema, Homan's sign negative  Neurologic: grossly nonfocal; Cranial nerves grossly wnl Psychologic: Normal mood and affect   ECG (independently read by me): Sinus bradycardia 58 bpm.  Normal intervals.  No significant ST-T changes.  April 2018 ECG (independently read by me): Normal sinus rhythm at 63 bpm.  Normal intervals.  No ST segment changes.  September 2017 ECG (independently read by me): Normal sinus rhythm at 60 bpm.  Nonspecific T changes.  Intervals are normal.  May 2017 ECG  (independently read by me): Sinus bradycardia 55 bpm.  No ectopy.  Normal intervals.  Nondiagnostic T changes in lead 3.  April 2017 ECG (independently read by me): Sinus bradycardia 55 bpm.  No ectopy.  No significant ST changes.  Normal intervals.  01/12/2015 ECG (independently read by me): Sinus bradycardia 54 bpm.  No ectopy.  Normal intervals.  June 2016 ECG (independently read by me): Sinus bradycardia 58 bpm.  Normal intervals.  No ectopy. Non-specific T change aVL  November 2015 ECG (independently read by me): Sinus bradycardia 54 bpm.  No significant ST-T changes.  Normal intervals.  May 2015 ECG (independently read by me): Sinus bradycardia 54 beats per minute.  No ectopy.  QTc interval 396 ms.  No significant ST changes.  04/22/2013 ECG (independently read by me): Sinus bradycardia 55 beats per minute. Nonspecific ST changes  03/01/2013 ECG (independently read by me): Sinus bradycardia 52 beats per minute. Normal intervals. No significant ST changes.  Prior ECG of 11/20/2012: Sinus rhythm at 51 beats per minute. QTc interval 400 ms. No significant ST changes.  LABS:  BMP Latest Ref Rng & Units 04/29/2016 07/10/2015 05/31/2015  Glucose 65 - 99 mg/dL 106(H) 109(H) 96  BUN 8 - 27 mg/dL '14 13 9  '$ Creatinine 0.76 - 1.27 mg/dL 1.29(H) 1.11 0.98  BUN/Creat Ratio 10 - '24 11 12 '$ -  Sodium 134 - 144 mmol/L 139 140 138  Potassium 3.5 - 5.2 mmol/L 5.2 5.0 4.3  Chloride 96 - 106 mmol/L 100 101 108  CO2 18 - 29 mmol/L 28 23 20(L)  Calcium 8.6 - 10.2 mg/dL 9.6 9.4 8.8(L)   Hepatic Function Latest Ref Rng & Units 04/29/2016 07/10/2015 05/29/2015  Total Protein 6.0 - 8.5 g/dL 7.1 7.3 6.2(L)  Albumin 3.5 - 4.7 g/dL 4.4 4.5 3.4(L)  AST 0 - 40 IU/L 34 41(H) 33  ALT 0 - 44 IU/L 37 43 36  Alk Phosphatase 39 - 117 IU/L 58 59 41  Total Bilirubin 0.0 - 1.2 mg/dL 0.8 1.0 0.7  Bilirubin, Direct 0.00 - 0.40 mg/dL - - -   CBC Latest Ref Rng & Units 04/29/2016 07/10/2015 05/31/2015  WBC 3.4 - 10.8 x10E3/uL  6.6 6.4 6.5  Hemoglobin 13.0 - 17.7 g/dL 16.3 15.7 14.1  Hematocrit 37.5 - 51.0 % 48.1 46.5 41.6  Platelets 150 - 379 x10E3/uL 225 236 194   Lab Results  Component Value Date   MCV 88 04/29/2016   MCV 86 07/10/2015   MCV 86.8 05/31/2015   Lab Results  Component Value Date   TSH 3.820 04/29/2016  .  Lipid Panel     Component Value Date/Time   CHOL 147 04/29/2016 0805   TRIG 151 (H) 04/29/2016 0805   TRIG 131 02/22/2013 0804   HDL 43 04/29/2016 0805   HDL 40 02/22/2013 0804   CHOLHDL 3.4 04/29/2016 0805   CHOLHDL 3.5 05/09/2015 1400   VLDL 31 (H) 05/09/2015 1400   LDLCALC 74 04/29/2016 0805   LDLCALC 50 02/22/2013 0804   RADIOLOGY: Dg Chest 2 View  08/17/2012   *RADIOLOGY REPORT*  Clinical Data: Renal cell carcinoma  CHEST - 2 VIEW  Comparison: 05/08/11  Findings: The cardiomediastinal silhouette is stable.  No acute infiltrate or pleural effusion.  No pulmonary edema.  Bony thorax is unremarkable.  IMPRESSION: No active disease.  No significant change.   Original Report Authenticated By: Lahoma Crocker, M.D.    IMPRESSION:  1. Coronary artery disease involving native coronary artery of native heart without angina pectoris   2. SOB (shortness of breath)   3. Essential hypertension, benign  4. Mixed hyperlipidemia   5. Atherosclerosis of aorta (HCC)   6. Stage III chronic kidney disease     ASSESSMENT AND PLAN: Mr. Andren Bethea is a young appearing 83 year old gentleman who has CAD dating back to 50 and has undergone multiple interventions in the past over a 10 year period from 39 to 1997. He developed unstable angina symptomatology in March 2015 and catheterization done initially at [facility] showed multivessel disease.  He underwent successful staged rotational coronary atherectomy initially involving the RCA in early March, and then on 04/27/2013 repeat intervention was done to the left circumflex system with rotational atherectomy.  At that time, he had a widely patent  stent of his RCA, as well as a widely patent stent in his LAD.  He also had 60-70% mid LAD stenosis, which had not progressed. In 2017 he noticed a change in symptomatology with the development of more shortness of breath, and no energy and felt occasional episodes of vague chest discomfort with possible left arm radiation.  He was hospitalized in late April 2017 and was found to have progressive 99% distal RCA stenosis after the PDA takeoff.  His stents in the LAD and circumflex were widely patent.  There was mild concomitant CAD in the circumflex.  His RCA was diffusely diseased and calcified with narrowings of 30% of the mid segment, but there was an angulated area of narrowing calcified narrowing of 40% before the acute margin, which prevented a stent from getting beyond this.  He underwent successful PTCA  to the distal stenosis.  Presently, he denies recurrent chest pain symptomatology, but since I last saw him has noticed more increasing shortness of breath, which seems to a significant progressed over the last several weeks.  He possible that he has developed some late restenosis following his PTCA, but he is adamant that he has not had any recurrent chest pain.  I am recommending that he undergo a PA lateral chest x-ray today to make certain there is no pneumonic process in the etiology of his dyspnea.  His blood pressure today is elevated and I will further titrate valsartan from 80 mg to 160 mg.  He will continue on amlodipine 10 mg, a total low 12.5 mg twice a day.  In the event of potential ischemic mediated dyspnea.  I will further titrate indoor to 60 mg daily.  We discussed in the future the possibility of initiating Ranexa.  If he continues to develop increasing symptomatology, repeat catheterization will need to be done.  Apparently, he has been taking the  meloxicam and I recommended he discontinue this.  He will undergoe laboratory in 2 weeks.  I will see him in 4-6 weeks for follow-up evaluation  and further recommendations will be made at that time.  He is tolerating the increased Livalo dose at 4 mg in combination with Zetia for hyperlipidemia.  His last creatinine was stable with class III category.   Time spent: 25 minutes  [name], MD, [facility]  07/23/2016 9:45 AM

## 2016-07-22 NOTE — Patient Instructions (Signed)
Medication Instructions:   INCREASE VALSARTAN TO 160 MG ONCE DAILY= 2 OF THE 80 MG TABLETS ONCE DAILY  INCREASE ISOSORBIDE TO 60 MG ONCE DAILY= 2 OF THE 30 MG TABLETS ONCE DAILY  STOP MELOXICAM  Labwork:  Your physician recommends that you return for lab work in: 2 WEEKS= DO NOT EAT PRIOR TO LAB WORK  Testing/Procedures:  A chest x-ray takes a picture of the organs and structures inside the chest, including the heart, lungs, and blood vessels. This test can show several things, including, whether the heart is enlarges; whether fluid is building up in the lungs; and whether pacemaker / defibrillator leads are still in place. AT [facility]  Follow-Up:  Your physician recommends that you schedule a follow-up appointment in: [place]   If you need a refill on your cardiac medications before your next appointment, please call your pharmacy.

## 2016-07-31 ENCOUNTER — Telehealth: Payer: Self-pay | Admitting: Cardiovascular Disease

## 2016-07-31 NOTE — Telephone Encounter (Signed)
Spoke with the patient. He stated that since his last visit on 6/18 that his shortness of breath has continued to get worse. He now feels it at rest. The patient stated that at his last appointment that it was mentioned that he may need another catheterization. An appointment has been made for tomorrow with Almyra Deforest, PA.

## 2016-07-31 NOTE — Telephone Encounter (Signed)
New message     Pt wife is calling for pt.   Pt c/o Shortness Of Breath: STAT if SOB developed within the last 24 hours or pt is noticeably SOB on the phone  1. Are you currently SOB (can you hear that pt is SOB on the phone)? All the time  2. How long have you been experiencing SOB? For a while, seen Dr. [name] on 6/18 and it's gotten worse since then  3. Are you SOB when sitting or when up moving around? both  4. Are you currently experiencing any other symptoms? Tired.

## 2016-08-01 ENCOUNTER — Encounter: Payer: Self-pay | Admitting: Physician Assistant

## 2016-08-01 ENCOUNTER — Ambulatory Visit (INDEPENDENT_AMBULATORY_CARE_PROVIDER_SITE_OTHER): Payer: Medicare Other | Admitting: Physician Assistant

## 2016-08-01 VITALS — BP 122/74 | HR 62 | Wt 198.2 lb

## 2016-08-01 DIAGNOSIS — I10 Essential (primary) hypertension: Secondary | ICD-10-CM

## 2016-08-01 DIAGNOSIS — Z01818 Encounter for other preprocedural examination: Secondary | ICD-10-CM

## 2016-08-01 DIAGNOSIS — R0609 Other forms of dyspnea: Secondary | ICD-10-CM | POA: Diagnosis not present

## 2016-08-01 DIAGNOSIS — I25110 Atherosclerotic heart disease of native coronary artery with unstable angina pectoris: Secondary | ICD-10-CM | POA: Diagnosis not present

## 2016-08-01 DIAGNOSIS — E785 Hyperlipidemia, unspecified: Secondary | ICD-10-CM

## 2016-08-01 DIAGNOSIS — I200 Unstable angina: Secondary | ICD-10-CM | POA: Diagnosis not present

## 2016-08-01 NOTE — Progress Notes (Signed)
Cardiology Office Note    Date:  08/02/2016   ID:  [name], DOB 1931-07-18, MRN [id number]  PCP:  Nicholas Aus, MD  Cardiologist:  Dr. Claiborne Douglas  Chief Complaint  Patient presents with  . Follow-up    NECK AND SHOULDER PAIN, SOB AND FATIGUE    History of Present Illness:  Nicholas Douglas is a 83 y.o. male with PMH of CAD s/p multiple PCI, HTN, HLD and small AAA. Cardiac catheterization in 2011 showed preserved LV function with 30-40% proximal LAD, 50% diffuse mid LAD disease, 70-80% distal LAD lesion, moderate disease in left circumflex, 80-90% lesion in acute marginal branch of RCA. He was placed on medical therapy. He later presented to [name] in March 2015, cardiac catheterization at that time showed severe 2 vessel CAD with significant calcification in LAD with up to 95% mid LAD stenosis, he underwent successful high-speed rotational atherectomy followed by 2.75 x 38 mm Promus DES in the mid LAD and a 3.0 x 28 mm Promus DES in proximal LAD. Medical therapy was recommended for his distal LAD stenosis. He has tried Lipitor and a subsequently Crestor in the past. He has flushing with niacin. He has been managed more on Zetia and Lovaza. He was later placed on Livalo and the symptoms to be tolerating this dose. Last cardiac catheterization in April 2017 showed widely patent stents in LAD and left circumflex vessel, RCA was diffusely diseased with calcification in the proximal and mid region with distal 99% stenosis and a noncalcified distal RCA segment immediately after the PDA takeoff. We were unable to place a stent in the area, ultimately he underwent successful PTCA of the 99% stenosis.  He was complaining of some dyspnea on exertion on recent follow-up. His blood pressure was elevated, valsartan was increased from 80 mg one daily 260 mg. Imdur was increased to 60 mg daily. He presents today for cardiology office visit. He continued to have very significant dyspnea on exertion  happened in the past month. This is the same angina equivalent he felt prior to PCI in April 2017. He actually had a negative Myoview in April 2017 prior to the cath. Given prior history of falsely negative Myoview, I recommended a repeat catheterization. I did discuss the case with Dr. Claiborne Douglas, patient's primary cardiologist who also agrees. Unfortunately, Dr. Claiborne Douglas is not in the cath lab until July 11, I have discussed with the patient to see if he wished to wait until then, he opted to have cardiac catheterization sooner than later.   Past Medical History:  Diagnosis Date  . BPH (benign prostatic hyperplasia)    Followed by Nicholas Douglas/urology  . CAD S/P PTCA only of RPAV-PL 05/31/2015   99% --> 0%PAV - PTCA only (unable to advance STENT) due to RCA calcification - prox & distal RCA 30%; Patent LAD stent & Cx stent (~20% ISR).   . Calculus of kidney    "that's how they found the cancer" (04/27/2013)  . Coronary artery disease involving native coronary artery of native heart with unstable angina pectoris (Nicholas Douglas) 12/03/2011   S/P Cardiac angioplasty 1986, 1991, 1993, 1997.  Last cardiac catheterization 2001.  Cardiolite 05/2011 low risk with normal EF 65%.  Followed by cardiology/Nicholas Douglas of [facility] every six months.   . History of myocardial infarction 1976  . Hypertension   . Impotence of organic origin   . Internal hemorrhoids without mention of complication   . Malignant neoplasm of kidney, except pelvis   .  Melanoma ([place]) 06/2010   Left arm  . Melanoma of back ([place]) 02/07/2015   R upper back; excision [facility].  . Other abnormal blood chemistry   . Other nonspecific abnormal serum enzyme levels   . Personal history of colonic polyps   . Pure hypercholesterolemia   . Tobacco use disorder   . Unspecified congenital cystic kidney disease   . Unstable angina ([name]) 04/05/2013; 05/2015    Past Surgical History:  Procedure Laterality Date  . APPENDECTOMY  2014  . CARDIAC CATHETERIZATION N/A 05/30/2015    Procedure: Left Heart Cath and Coronary Angiography;  Surgeon: Nicholas Sine, MD;  Location: [place] [facility];  Service: Cardiovascular;  Laterality: N/A;  . CARDIAC CATHETERIZATION N/A 05/30/2015   Procedure: Coronary Balloon Angioplasty;  Surgeon: Nicholas Sine, MD;  Location: [place] [facility];  Service: Cardiovascular;  Laterality: N/A;  . Cardiolite  05/06/2011   low risk study; normal EF.  [facility]  . carotid dopplers  03/08/2011   minimal plaque formation B. Symptoms: dizziness.  . COLONOSCOPY  10/05/2008   single polyp, IH.  Nicholas Douglas.  Repeat in 3 years.  . CORONARY ANGIOPLASTY     "I've had 4" (04/27/2013)  . CORONARY ANGIOPLASTY WITH STENT PLACEMENT  04/2013; 04/27/2013   "2 + 1" (04/27/2013)  . EYE SURGERY  11/04/2013   Cataract surgery B; Nicholas Douglas.  Marland Kitchen MELANOMA EXCISION Left ~ 2007   "arm"  . MELANOMA EXCISION  02/07/2015   R upper back. [facility]  . NEPHRECTOMY Left 2006  . PERCUTANEOUS CORONARY ROTOBLATOR INTERVENTION (PCI-R) N/A 04/06/2013   Procedure: PERCUTANEOUS CORONARY ROTOBLATOR INTERVENTION (PCI-R);  Surgeon: Nicholas M Martinique, MD;  Location: [facility] CATH LAB;  Service: Cardiovascular;  Laterality: N/A;  . PERCUTANEOUS CORONARY STENT INTERVENTION (PCI-S) N/A 04/27/2013   Procedure: PERCUTANEOUS CORONARY STENT INTERVENTION (PCI-S);  Surgeon: Nicholas Sine, MD;  Location: [facility] CATH LAB;  Service: Cardiovascular;  Laterality: N/A;    Current Medications: Outpatient Medications Prior to Visit  Medication Sig Dispense Refill  . acetaminophen (TYLENOL) 500 MG tablet Take 500 mg by mouth daily as needed for moderate pain.    Marland Kitchen amLODipine (NORVASC) 10 MG tablet Take 1 tablet (10 mg total) by mouth daily. 90 tablet 3  . aspirin 81 MG tablet Take 81 mg by mouth at bedtime.     Marland Kitchen atenolol (TENORMIN) 25 MG tablet TAKE ONE-HALF (1/2) TABLET TWICE A DAY 90 tablet 3  . clopidogrel (PLAVIX) 75 MG tablet Take 1 tablet (75 mg total) by mouth daily. 90 tablet 3  . ezetimibe (ZETIA) 10 MG tablet Take 1 tablet  (10 mg total) by mouth daily. 90 tablet 3  . finasteride (PROSCAR) 5 MG tablet Take 1 tablet (5 mg total) by mouth daily. (Patient taking differently: Take 2.5 mg by mouth daily. ) 90 tablet 3  . Icosapent Ethyl 1 g CAPS Take 1 g by mouth 2 (two) times daily. 180 capsule 1  . isosorbide mononitrate (IMDUR) 60 MG 24 hr tablet Take 1 tablet (60 mg total) by mouth daily. 90 tablet 3  . latanoprost (XALATAN) 0.005 % ophthalmic solution Place 1 drop into both eyes at bedtime.  1  . nitroGLYCERIN (NITROSTAT) 0.4 MG SL tablet Place 1 tablet (0.4 mg total) under the tongue every 5 (five) minutes as needed for chest pain. 25 tablet 2  . Pitavastatin Calcium (LIVALO) 4 MG TABS Take 1 tablet (4 mg total) by mouth daily. 90 tablet 3  . traZODone (DESYREL) 50 MG  tablet Take 50 mg by mouth at bedtime as needed for sleep.   2  . valsartan (DIOVAN) 160 MG tablet Take 1 tablet (160 mg total) by mouth daily. 90 tablet 3   No facility-administered medications prior to visit.      Allergies:   Lovastatin; Morphine; Morphine and related; and Nifedipine   Social History   Social History  . Marital status: Married    Spouse name: N/A  . Number of children: 3  . Years of education: college   Occupational History  . Minister  Retired    Personal assistant, Social worker of [employer]  . Smoking status: Former Smoker    Types: Pipe, Landscape architect  . Smokeless tobacco: Former Systems developer    Types: Chew     Comment: 04/27/2013 "quit smoking & chewing last year"  . Alcohol use 3.0 oz/week    5 Glasses of wine per week  . Drug use: No  . Sexual activity: Yes   Other Topics Concern  . None   Social History Narrative   Marital status:  Married x 30 years; 2nd marriage      Children:  3 children, 2 step-children and 10 grandchildren.        Lives:  Lives with wife.         Employment:  Retired; Environmental education officer      Tobacco:  Former user; chews tobacco.      Alcohol:  5 glasses of wine per week       Drugs:   No drugs.        Exercise: 5 x week Light; plays golf 5 days per week but rides golf cart, walking 1 mile.         Advanced Directives:  Patient DOES NOT have living will; desires DNR/DNI.       ADLS: independent with all ADLs; drives.  No falls.  Does not use assistant devices with ambulation.       Family History:  The patient's family history includes AAA (abdominal aortic aneurysm) in his mother; Heart disease in his mother.   ROS:   Please see the history of present illness.    ROS All other systems reviewed and are negative.   PHYSICAL EXAM:   VS:  BP 122/74   Pulse 62   Wt 198 lb 3.2 oz (89.9 kg)   SpO2 94%   BMI 31.04 kg/m    GEN: Well nourished, well developed, in no acute distress  HEENT: normal  Neck: no JVD, carotid bruits, or masses Cardiac: RRR; no murmurs, rubs, or gallops,no edema  Respiratory:  clear to auscultation bilaterally, normal work of breathing GI: soft, nontender, nondistended, + BS MS: no deformity or atrophy  Skin: warm and dry, no rash Neuro:  Alert and Oriented x 3, Strength and sensation are intact Psych: euthymic mood, full affect  Wt Readings from Last 3 Encounters:  08/01/16 198 lb 3.2 oz (89.9 kg)  07/22/16 196 lb (88.9 kg)  05/13/16 199 lb (90.3 kg)      Studies/Labs Reviewed:   EKG:  EKG is not ordered today.   Recent Labs: 04/29/2016: ALT 37; BUN 14; Creatinine, Ser 1.29; Hemoglobin 16.3; Platelets 225; Potassium 5.2; Sodium 139; TSH 3.820   Lipid Panel    Component Value Date/Time   CHOL 147 04/29/2016 0805   TRIG 151 (H) 04/29/2016 0805   TRIG 131 02/22/2013 0804   HDL 43 04/29/2016 0805   HDL 40 02/22/2013 0804  CHOLHDL 3.4 04/29/2016 0805   CHOLHDL 3.5 05/09/2015 1400   VLDL 31 (H) 05/09/2015 1400   LDLCALC 74 04/29/2016 0805   LDLCALC 50 02/22/2013 0804    Additional studies/ records that were reviewed today include:   Echo 05/25/2015 LV EF: 55% -   60%  Study Conclusions  - Left ventricle: The cavity  size was normal. Wall thickness was   increased in a pattern of mild LVH. Systolic function was normal.   The estimated ejection fraction was in the range of 55% to 60%.   Regional wall motion abnormalities cannot be excluded. Doppler   parameters are consistent with abnormal left ventricular   relaxation (grade 1 diastolic dysfunction). - Mitral valve: Calcified annulus. - Left atrium: The atrium was mildly dilated.  Impressions:  - Technically difficult; LV function appears to be preserved; focal   wall motion abnormality cannot be excluded; grade 1 diastolic   dysfunction; trace MR; mild LAE.    Cath 05/30/2015 Conclusion    Prox Cx lesion, 20% stenosed.  2nd Mrg lesion, 30% stenosed.  Prox RCA lesion, 30% stenosed.  Dist RCA lesion, 30% stenosed.  Post Atrio lesion, 99% stenosed. Post intervention, there is a 0% residual stenosis.   Significant coronary calcification involving the LAD, left circumflex, and RCA.  Widely patent stent extending from the LAD ostium to the mid LAD.  Widely patent stent in the left circumflex coronary artery with mild 20% narrowing prior to the stented segment and 30% narrowing at the ostium of the OM vessel.  Diffusely calcified proximal to mid RCA with proximal and mid 30% stenoses with 30% narrowing in the region of the acute margin but evidence for new 99%  stenosis in a noncalcified distal RCA segment immediately after the PDA takeoff.  Successful PCI to the distal RCA stenosis with a long unsuccessful attempt at trying to place a stent at this site due to the significant calcification in the mid and acute margin region of the RCA, but with successful PTCA with a 99% stenosis being reduced to 0%.  RECOMMENDATION: The patient should continue on dual antiplatelet therapy indefinitely.  Aggressive hydration post procedure in this patient with a solitary kidney.     ASSESSMENT:    1. DOE (dyspnea on exertion)   2. Unstable angina  ([place])   3. Pre-op testing   4. Coronary artery disease involving native coronary artery of native heart with unstable angina pectoris ([place])   5. Essential hypertension   6. Hyperlipidemia, unspecified hyperlipidemia type      PLAN:  In order of problems listed above:  1. Dyspnea on exertion concerning for anginal equivalent: This is the same angina symptoms he felt prior to April 2017 cardiac catheterization. He had an negative Myoview in April 2017 prior to cardiac catheterization revealing 99% stenosis. Dr. Claiborne Douglas has attempted to titrate medical therapy, however his symptom has not improved. Given falsely negative Myoview, we recommend a cardiac catheterization this time. Dr. Claiborne Douglas will not be back in the cath lab until July 11, patient opted to have cardiac catheterization sooner.  - Risk and benefit of procedure explained to the patient who display clear understanding and agree to proceed.  Discussed with patient possible procedural risk include bleeding, vascular injury, renal injury, arrythmia, MI, stroke and loss of limb or life.  2. CAD: Last cardiac catheterization in April 2017 showed widely patent stents in LAD and left circumflex vessel, RCA was diffusely diseased with calcification in the proximal and mid region  with distal 99% stenosis and a noncalcified distal RCA segment immediately after the PDA takeoff. We were unable to place a stent in the area, ultimately he underwent successful PTCA of the 99% stenosis.  3. HTN: Blood pressure stable, 122/74 today. Imdur was uptitrated during the last office visit without significant affect on the symptom.  4. HLD: On Zetia and Livalo    Medication Adjustments/Labs and Tests Ordered: Current medicines are reviewed at length with the patient today.  Concerns regarding medicines are outlined above.  Medication changes, Labs and Tests ordered today are listed in the Patient Instructions below. Patient Instructions    Medication  Instructions:  START ASPIRIN 81MG  DAILY  If you need a refill on your cardiac medications before your next appointment, please call your pharmacy.   Thank you for choosing [facility] at [facility]!!         [place] [address] [place] [address] Dept: [phone] Loc: [place]  08/01/2016  You are scheduled for a Cardiac Catheterization on Monday, July 2 with Dr. Peter Douglas.  1. Please arrive at the [facility] (Main Entrance A) at [facility]: [address] [place], [place] [address] at 8:00 AM (two hours before your procedure to ensure your preparation). Free valet parking service is available.  Special note: Every effort is made to have your procedure done on time. Please understand that emergencies sometimes delay scheduled procedures. 2. Diet: Do not eat or drink anything after midnight prior to your procedure except sips of water to take medications.  3. Labs: LAB TODAY HERE IN OUT OFFICE AT [facility]  4. Medication instructions in preparation for your procedure:  On the morning of your procedure, take your Aspirin and your regular morning medicines.  You may use sips of water. 5. Plan for one night stay--bring personal belongings. 6. Bring a current list of your medications and current insurance cards. 7. You MUST have a responsible person to drive you home. 8. Someone MUST be with you the first 24 hours after you arrive home or your discharge will be delayed. 9. Please wear clothes that are easy to get on and off and wear slip-on shoes. Thank you for allowing [place] to care for you!   -- [facility] Invasive Cardiovascular services      Signed, Nicholas Douglas, [place]  08/02/2016 12:01 AM    [place], [place], [place]  [address] Phone: [phone]; Fax: [phone]

## 2016-08-01 NOTE — Patient Instructions (Addendum)
   Medication Instructions:  START ASPIRIN 81MG  DAILY  If you need a refill on your cardiac medications before your next appointment, please call your pharmacy.   Thank you for choosing [facility] at [facility]!!         [place] [address] [place] [address] Dept: [phone] Loc: [place]  08/01/2016  You are scheduled for a Cardiac Catheterization on Monday, July 2 with Dr. Peter Martinique.  1. Please arrive at the [facility] (Main Entrance A) at [facility]: [address] [place], [place] [address] at 8:00 AM (two hours before your procedure to ensure your preparation). Free valet parking service is available.  Special note: Every effort is made to have your procedure done on time. Please understand that emergencies sometimes delay scheduled procedures. 2. Diet: Do not eat or drink anything after midnight prior to your procedure except sips of water to take medications.  3. Labs: LAB TODAY HERE IN OUT OFFICE AT [facility]  4. Medication instructions in preparation for your procedure:  On the morning of your procedure, take your Aspirin and your regular morning medicines.  You may use sips of water. 5. Plan for one night stay--bring personal belongings. 6. Bring a current list of your medications and current insurance cards. 7. You MUST have a responsible person to drive you home. 8. Someone MUST be with you the first 24 hours after you arrive home or your discharge will be delayed. 9. Please wear clothes that are easy to get on and off and wear slip-on shoes. Thank you for allowing [place] to care for you!   -- [place] Invasive Cardiovascular services

## 2016-08-02 ENCOUNTER — Telehealth: Payer: Self-pay

## 2016-08-02 ENCOUNTER — Other Ambulatory Visit: Payer: Self-pay | Admitting: Physician Assistant

## 2016-08-02 LAB — PROTIME-INR
INR: 0.9 (ref 0.8–1.2)
PROTHROMBIN TIME: 10 s (ref 9.1–12.0)

## 2016-08-02 LAB — BASIC METABOLIC PANEL
BUN / CREAT RATIO: 10 (ref 10–24)
BUN: 13 mg/dL (ref 8–27)
CHLORIDE: 101 mmol/L (ref 96–106)
CO2: 23 mmol/L (ref 20–29)
Calcium: 9.9 mg/dL (ref 8.6–10.2)
Creatinine, Ser: 1.26 mg/dL (ref 0.76–1.27)
GFR calc non Af Amer: 52 mL/min/{1.73_m2} — ABNORMAL LOW (ref >59)
GFR, EST AFRICAN AMERICAN: 61 mL/min/{1.73_m2} (ref >59)
Glucose: 70 mg/dL (ref 65–99)
POTASSIUM: 5.2 mmol/L (ref 3.5–5.2)
SODIUM: 141 mmol/L (ref 134–144)

## 2016-08-02 LAB — CBC
Hematocrit: 47.9 % (ref 37.5–51.0)
Hemoglobin: 16.2 g/dL (ref 13.0–17.7)
MCH: 29.6 pg (ref 26.6–33.0)
MCHC: 33.8 g/dL (ref 31.5–35.7)
MCV: 87 fL (ref 79–97)
Platelets: 259 10*3/uL (ref 150–379)
RBC: 5.48 x10E6/uL (ref 4.14–5.80)
RDW: 13.5 % (ref 12.3–15.4)
WBC: 7.5 10*3/uL (ref 3.4–10.8)

## 2016-08-02 NOTE — Progress Notes (Signed)
Labs stable, no obvious contraindication for procedure.

## 2016-08-02 NOTE — Telephone Encounter (Signed)
Patient contacted pre-catheterization at [facility] scheduled for: 08/05/2016 @ 1030  Verified arrival time and place:  [facility] @ 0800  Confirmed AM meds to be taken pre-cath with sip of water:  Pt states he will take ASA and Plavix prior to arrival.   Confirmed patient has responsible person to drive home post procedure and observe patient for 24 hours:  Wife  Addl concerns: Pt with allergy to morphine

## 2016-08-05 ENCOUNTER — Ambulatory Visit (HOSPITAL_COMMUNITY)
Admission: RE | Admit: 2016-08-05 | Discharge: 2016-08-05 | Disposition: A | Discharge status: Home / Self Care | Payer: Medicare Other | Source: Ambulatory Visit | Attending: Cardiology | Admitting: Cardiology

## 2016-08-05 ENCOUNTER — Encounter (HOSPITAL_COMMUNITY)
Admission: RE | Disposition: A | Discharge status: Home / Self Care | Payer: Self-pay | Source: Ambulatory Visit | Attending: Cardiology

## 2016-08-05 DIAGNOSIS — Z955 Presence of coronary angioplasty implant and graft: Secondary | ICD-10-CM | POA: Diagnosis not present

## 2016-08-05 DIAGNOSIS — E785 Hyperlipidemia, unspecified: Secondary | ICD-10-CM | POA: Diagnosis present

## 2016-08-05 DIAGNOSIS — I10 Essential (primary) hypertension: Secondary | ICD-10-CM | POA: Diagnosis present

## 2016-08-05 DIAGNOSIS — Q619 Cystic kidney disease, unspecified: Secondary | ICD-10-CM | POA: Diagnosis not present

## 2016-08-05 DIAGNOSIS — I714 Abdominal aortic aneurysm, without rupture: Secondary | ICD-10-CM | POA: Diagnosis not present

## 2016-08-05 DIAGNOSIS — N183 Chronic kidney disease, stage 3 unspecified: Secondary | ICD-10-CM | POA: Diagnosis present

## 2016-08-05 DIAGNOSIS — I252 Old myocardial infarction: Secondary | ICD-10-CM | POA: Diagnosis not present

## 2016-08-05 DIAGNOSIS — N400 Enlarged prostate without lower urinary tract symptoms: Secondary | ICD-10-CM | POA: Insufficient documentation

## 2016-08-05 DIAGNOSIS — E7800 Pure hypercholesterolemia, unspecified: Secondary | ICD-10-CM | POA: Diagnosis not present

## 2016-08-05 DIAGNOSIS — I25119 Atherosclerotic heart disease of native coronary artery with unspecified angina pectoris: Secondary | ICD-10-CM | POA: Diagnosis not present

## 2016-08-05 DIAGNOSIS — I25110 Atherosclerotic heart disease of native coronary artery with unstable angina pectoris: Secondary | ICD-10-CM | POA: Diagnosis not present

## 2016-08-05 DIAGNOSIS — Z7982 Long term (current) use of aspirin: Secondary | ICD-10-CM | POA: Insufficient documentation

## 2016-08-05 DIAGNOSIS — Z7902 Long term (current) use of antithrombotics/antiplatelets: Secondary | ICD-10-CM | POA: Insufficient documentation

## 2016-08-05 DIAGNOSIS — Z87891 Personal history of nicotine dependence: Secondary | ICD-10-CM | POA: Diagnosis not present

## 2016-08-05 DIAGNOSIS — I2510 Atherosclerotic heart disease of native coronary artery without angina pectoris: Secondary | ICD-10-CM | POA: Diagnosis present

## 2016-08-05 DIAGNOSIS — I200 Unstable angina: Secondary | ICD-10-CM | POA: Diagnosis present

## 2016-08-05 DIAGNOSIS — I209 Angina pectoris, unspecified: Secondary | ICD-10-CM | POA: Diagnosis present

## 2016-08-05 HISTORY — PX: LEFT HEART CATH AND CORONARY ANGIOGRAPHY: CATH118249

## 2016-08-05 SURGERY — LEFT HEART CATH AND CORONARY ANGIOGRAPHY
Anesthesia: LOCAL

## 2016-08-05 MED ORDER — SODIUM CHLORIDE 0.9% FLUSH: 3.0000 mL | INTRAVENOUS | Status: DC | PRN

## 2016-08-05 MED ORDER — HEPARIN (PORCINE) IN NACL 2-0.9 UNIT/ML-% IJ SOLN
INTRAMUSCULAR | Status: AC
  Filled 2016-08-05: qty 1000

## 2016-08-05 MED ORDER — SODIUM CHLORIDE 0.9 % WEIGHT BASED INFUSION: 1.0000 mL/kg/h | INTRAVENOUS | Status: DC

## 2016-08-05 MED ORDER — IOPAMIDOL (ISOVUE-370) INJECTION 76%
INTRAVENOUS | Status: AC
  Filled 2016-08-05: qty 100

## 2016-08-05 MED ORDER — SODIUM CHLORIDE 0.9 % WEIGHT BASED INFUSION
3.0000 mL/kg/h | INTRAVENOUS | Status: DC
  Administered 2016-08-05: 3 mL/kg/h via INTRAVENOUS

## 2016-08-05 MED ORDER — LIDOCAINE HCL (PF) 1 % IJ SOLN
INTRAMUSCULAR | Status: DC | PRN
  Administered 2016-08-05: 2 mL

## 2016-08-05 MED ORDER — SODIUM CHLORIDE 0.9% FLUSH: 3.0000 mL | Freq: Two times a day (BID) | INTRAVENOUS | Status: DC

## 2016-08-05 MED ORDER — FENTANYL CITRATE (PF) 100 MCG/2ML IJ SOLN
INTRAMUSCULAR | Status: DC | PRN
  Administered 2016-08-05: 25 ug via INTRAVENOUS

## 2016-08-05 MED ORDER — FENTANYL CITRATE (PF) 100 MCG/2ML IJ SOLN
INTRAMUSCULAR | Status: AC
  Filled 2016-08-05: qty 2

## 2016-08-05 MED ORDER — SODIUM CHLORIDE 0.9 % WEIGHT BASED INFUSION: 1.0000 mL/kg/h | INTRAVENOUS | Status: AC

## 2016-08-05 MED ORDER — SODIUM CHLORIDE 0.9 % IV SOLN: 250.0000 mL | INTRAVENOUS | Status: DC | PRN

## 2016-08-05 MED ORDER — ASPIRIN 81 MG PO CHEW: 81.0000 mg | CHEWABLE_TABLET | ORAL | Status: DC

## 2016-08-05 MED ORDER — LIDOCAINE HCL 1 % IJ SOLN
INTRAMUSCULAR | Status: AC
  Filled 2016-08-05: qty 20

## 2016-08-05 MED ORDER — HEPARIN SODIUM (PORCINE) 1000 UNIT/ML IJ SOLN
INTRAMUSCULAR | Status: AC
Start: 2016-08-05 — End: 2016-08-05
  Filled 2016-08-05: qty 1

## 2016-08-05 MED ORDER — VERAPAMIL HCL 2.5 MG/ML IV SOLN
INTRAVENOUS | Status: AC
  Filled 2016-08-05: qty 2

## 2016-08-05 MED ORDER — HEPARIN (PORCINE) IN NACL 2-0.9 UNIT/ML-% IJ SOLN
INTRAMUSCULAR | Status: DC | PRN
  Administered 2016-08-05: 11:00:00

## 2016-08-05 MED ORDER — MIDAZOLAM HCL 2 MG/2ML IJ SOLN
INTRAMUSCULAR | Status: DC | PRN
  Administered 2016-08-05: 1 mg via INTRAVENOUS

## 2016-08-05 MED ORDER — HEPARIN SODIUM (PORCINE) 1000 UNIT/ML IJ SOLN
INTRAMUSCULAR | Status: DC | PRN
  Administered 2016-08-05: 4500 [IU] via INTRAVENOUS

## 2016-08-05 MED ORDER — IOPAMIDOL (ISOVUE-370) INJECTION 76%
INTRAVENOUS | Status: DC | PRN
  Administered 2016-08-05: 75 mL

## 2016-08-05 MED ORDER — VERAPAMIL HCL 2.5 MG/ML IV SOLN
INTRAVENOUS | Status: DC | PRN
  Administered 2016-08-05: 10:00:00 via INTRA_ARTERIAL

## 2016-08-05 MED ORDER — MIDAZOLAM HCL 2 MG/2ML IJ SOLN
INTRAMUSCULAR | Status: AC
  Filled 2016-08-05: qty 2

## 2016-08-05 SURGICAL SUPPLY — 11 items
CATH 5FR JL3.5 JR4 ANG PIG MP (CATHETERS) ×2 IMPLANT
DEVICE RAD COMP TR BAND LRG (VASCULAR PRODUCTS) ×2 IMPLANT
GLIDESHEATH SLEND SS 6F .021 (SHEATH) ×2 IMPLANT
GUIDEWIRE INQWIRE 1.5J.035X260 (WIRE) ×1 IMPLANT
INQWIRE 1.5J .035X260CM (WIRE) ×2
KIT HEART LEFT (KITS) ×2 IMPLANT
PACK CARDIAC CATHETERIZATION (CUSTOM PROCEDURE TRAY) ×2 IMPLANT
SYR MEDRAD MARK V 150ML (SYRINGE) ×2 IMPLANT
TRANSDUCER W/STOPCOCK (MISCELLANEOUS) ×2 IMPLANT
TUBING CIL FLEX 10 FLL-RA (TUBING) ×2 IMPLANT
WIRE HI TORQ VERSACORE-J 145CM (WIRE) ×2 IMPLANT

## 2016-08-05 NOTE — Discharge Instructions (Signed)

## 2016-08-05 NOTE — Progress Notes (Signed)
Pts time is up for TB deflation.  Dressing is clean dry and intact.  Site is a level 0.  Pt and wife both stated that they understood discharge instructions.

## 2016-08-05 NOTE — H&P (View-Only) (Signed)
Cardiology Office Note    Date:  08/02/2016   ID:  Nicholas Douglas, DOB 09/22/1931, MRN [id number]  PCP:  Rusty Aus, MD  Cardiologist:  Dr. [name]  Chief Complaint  Patient presents with  . Follow-up    NECK AND SHOULDER PAIN, SOB AND FATIGUE    History of Present Illness:  Nicholas Douglas is a 83 y.o. male with PMH of CAD s/p multiple PCI, HTN, HLD and small AAA. Cardiac catheterization in 2011 showed preserved LV function with 30-40% proximal LAD, 50% diffuse mid LAD disease, 70-80% distal LAD lesion, moderate disease in left circumflex, 80-90% lesion in acute marginal branch of RCA. He was placed on medical therapy. He later presented to [facility] in March 2015, cardiac catheterization at that time showed severe 2 vessel CAD with significant calcification in LAD with up to 95% mid LAD stenosis, he underwent successful high-speed rotational atherectomy followed by 2.75 x 38 mm Promus DES in the mid LAD and a 3.0 x 28 mm Promus DES in proximal LAD. Medical therapy was recommended for his distal LAD stenosis. He has tried Lipitor and a subsequently Crestor in the past. He has flushing with niacin. He has been managed more on Zetia and Lovaza. He was later placed on Livalo and the symptoms to be tolerating this dose. Last cardiac catheterization in April 2017 showed widely patent stents in LAD and left circumflex vessel, RCA was diffusely diseased with calcification in the proximal and mid region with distal 99% stenosis and a noncalcified distal RCA segment immediately after the PDA takeoff. We were unable to place a stent in the area, ultimately he underwent successful PTCA of the 99% stenosis.  He was complaining of some dyspnea on exertion on recent follow-up. His blood pressure was elevated, valsartan was increased from 80 mg one daily 260 mg. Imdur was increased to 60 mg daily. He presents today for cardiology office visit. He continued to have very significant dyspnea on exertion  happened in the past month. This is the same angina equivalent he felt prior to PCI in April 2017. He actually had a negative Myoview in April 2017 prior to the cath. Given prior history of falsely negative Myoview, I recommended a repeat catheterization. I did discuss the case with Dr. [name], patient's primary cardiologist who also agrees. Unfortunately, Dr. [name] is not in the cath lab until July 11, I have discussed with the patient to see if he wished to wait until then, he opted to have cardiac catheterization sooner than later.   Past Medical History:  Diagnosis Date  . BPH (benign prostatic hyperplasia)    Followed by [name]/urology  . CAD S/P PTCA only of RPAV-PL 05/31/2015   99% --> 0%PAV - PTCA only (unable to advance STENT) due to RCA calcification - prox & distal RCA 30%; Patent LAD stent & Cx stent (~20% ISR).   . Calculus of kidney    "that's how they found the cancer" (04/27/2013)  . Coronary artery disease involving native coronary artery of native heart with unstable angina pectoris ([place]) 12/03/2011   S/P Cardiac angioplasty 1986, 1991, 1993, 1997.  Last cardiac catheterization 2001.  Cardiolite 05/2011 low risk with normal EF 65%.  Followed by cardiology/[name] every six months.   . History of myocardial infarction 1976  . Hypertension   . Impotence of organic origin   . Internal hemorrhoids without mention of complication   . Malignant neoplasm of kidney, except pelvis   .  Melanoma ([place]) 06/2010   Left arm  . Melanoma of back ([place]) 02/07/2015   R upper back; excision [facility].  . Other abnormal blood chemistry   . Other nonspecific abnormal serum enzyme levels   . Personal history of colonic polyps   . Pure hypercholesterolemia   . Tobacco use disorder   . Unspecified congenital cystic kidney disease   . Unstable angina ([place]) 04/05/2013; 05/2015    Past Surgical History:  Procedure Laterality Date  . APPENDECTOMY  2014  . CARDIAC CATHETERIZATION N/A 05/30/2015    Procedure: Left Heart Cath and Coronary Angiography;  Surgeon: [name], MD;  Location: [facility];  Service: Cardiovascular;  Laterality: N/A;  . CARDIAC CATHETERIZATION N/A 05/30/2015   Procedure: Coronary Balloon Angioplasty;  Surgeon: [name], MD;  Location: [place] [facility];  Service: Cardiovascular;  Laterality: N/A;  . Cardiolite  05/06/2011   low risk study; normal EF.  [facility]  . carotid dopplers  03/08/2011   minimal plaque formation B. Symptoms: dizziness.  . COLONOSCOPY  10/05/2008   single polyp, IH.  [name].  Repeat in 3 years.  . CORONARY ANGIOPLASTY     "I've had 4" (04/27/2013)  . CORONARY ANGIOPLASTY WITH STENT PLACEMENT  04/2013; 04/27/2013   "2 + 1" (04/27/2013)  . EYE SURGERY  11/04/2013   Cataract surgery B; [place].  Marland Kitchen MELANOMA EXCISION Left ~ 2007   "arm"  . MELANOMA EXCISION  02/07/2015   R upper back. [facility]  . NEPHRECTOMY Left 2006  . PERCUTANEOUS CORONARY ROTOBLATOR INTERVENTION (PCI-R) N/A 04/06/2013   Procedure: PERCUTANEOUS CORONARY ROTOBLATOR INTERVENTION (PCI-R);  Surgeon: [name], MD;  Location: [facility];  Service: Cardiovascular;  Laterality: N/A;  . PERCUTANEOUS CORONARY STENT INTERVENTION (PCI-S) N/A 04/27/2013   Procedure: PERCUTANEOUS CORONARY STENT INTERVENTION (PCI-S);  Surgeon: [name], MD;  Location: [facility] CATH LAB;  Service: Cardiovascular;  Laterality: N/A;    Current Medications: Outpatient Medications Prior to Visit  Medication Sig Dispense Refill  . acetaminophen (TYLENOL) 500 MG tablet Take 500 mg by mouth daily as needed for moderate pain.    Marland Kitchen amLODipine (NORVASC) 10 MG tablet Take 1 tablet (10 mg total) by mouth daily. 90 tablet 3  . aspirin 81 MG tablet Take 81 mg by mouth at bedtime.     Marland Kitchen atenolol (TENORMIN) 25 MG tablet TAKE ONE-HALF (1/2) TABLET TWICE A DAY 90 tablet 3  . clopidogrel (PLAVIX) 75 MG tablet Take 1 tablet (75 mg total) by mouth daily. 90 tablet 3  . ezetimibe (ZETIA) 10 MG tablet Take 1 tablet  (10 mg total) by mouth daily. 90 tablet 3  . finasteride (PROSCAR) 5 MG tablet Take 1 tablet (5 mg total) by mouth daily. (Patient taking differently: Take 2.5 mg by mouth daily. ) 90 tablet 3  . Icosapent Ethyl 1 g CAPS Take 1 g by mouth 2 (two) times daily. 180 capsule 1  . isosorbide mononitrate (IMDUR) 60 MG 24 hr tablet Take 1 tablet (60 mg total) by mouth daily. 90 tablet 3  . latanoprost (XALATAN) 0.005 % ophthalmic solution Place 1 drop into both eyes at bedtime.  1  . nitroGLYCERIN (NITROSTAT) 0.4 MG SL tablet Place 1 tablet (0.4 mg total) under the tongue every 5 (five) minutes as needed for chest pain. 25 tablet 2  . Pitavastatin Calcium (LIVALO) 4 MG TABS Take 1 tablet (4 mg total) by mouth daily. 90 tablet 3  . traZODone (DESYREL) 50 MG  tablet Take 50 mg by mouth at bedtime as needed for sleep.   2  . valsartan (DIOVAN) 160 MG tablet Take 1 tablet (160 mg total) by mouth daily. 90 tablet 3   No facility-administered medications prior to visit.      Allergies:   Lovastatin; Morphine; Morphine and related; and Nifedipine   Social History   Social History  . Marital status: Married    Spouse name: N/A  . Number of children: 3  . Years of education: college   Occupational History  . Minister  Retired    Personal assistant, Social worker of [employer]  . Smoking status: Former Smoker    Types: Pipe, Landscape architect  . Smokeless tobacco: Former Systems developer    Types: Chew     Comment: 04/27/2013 "quit smoking & chewing last year"  . Alcohol use 3.0 oz/week    5 Glasses of wine per week  . Drug use: No  . Sexual activity: Yes   Other Topics Concern  . None   Social History Narrative   Marital status:  Married x 30 years; 2nd marriage      Children:  3 children, 2 step-children and 10 grandchildren.        Lives:  Lives with wife.         Employment:  Retired; Environmental education officer      Tobacco:  Former user; chews tobacco.      Alcohol:  5 glasses of wine per week       Drugs:   No drugs.        Exercise: 5 x week Light; plays golf 5 days per week but rides golf cart, walking 1 mile.         Advanced Directives:  Patient DOES NOT have living will; desires DNR/DNI.       ADLS: independent with all ADLs; drives.  No falls.  Does not use assistant devices with ambulation.       Family History:  The patient's family history includes AAA (abdominal aortic aneurysm) in his mother; Heart disease in his mother.   ROS:   Please see the history of present illness.    ROS All other systems reviewed and are negative.   PHYSICAL EXAM:   VS:  BP 122/74   Pulse 62   Wt 198 lb 3.2 oz (89.9 kg)   SpO2 94%   BMI 31.04 kg/m    GEN: Well nourished, well developed, in no acute distress  HEENT: normal  Neck: no JVD, carotid bruits, or masses Cardiac: RRR; no murmurs, rubs, or gallops,no edema  Respiratory:  clear to auscultation bilaterally, normal work of breathing GI: soft, nontender, nondistended, + BS MS: no deformity or atrophy  Skin: warm and dry, no rash Neuro:  Alert and Oriented x 3, Strength and sensation are intact Psych: euthymic mood, full affect  Wt Readings from Last 3 Encounters:  08/01/16 198 lb 3.2 oz (89.9 kg)  07/22/16 196 lb (88.9 kg)  05/13/16 199 lb (90.3 kg)      Studies/Labs Reviewed:   EKG:  EKG is not ordered today.   Recent Labs: 04/29/2016: ALT 37; BUN 14; Creatinine, Ser 1.29; Hemoglobin 16.3; Platelets 225; Potassium 5.2; Sodium 139; TSH 3.820   Lipid Panel    Component Value Date/Time   CHOL 147 04/29/2016 0805   TRIG 151 (H) 04/29/2016 0805   TRIG 131 02/22/2013 0804   HDL 43 04/29/2016 0805   HDL 40 02/22/2013 0804  CHOLHDL 3.4 04/29/2016 0805   CHOLHDL 3.5 05/09/2015 1400   VLDL 31 (H) 05/09/2015 1400   LDLCALC 74 04/29/2016 0805   LDLCALC 50 02/22/2013 0804    Additional studies/ records that were reviewed today include:   Echo 05/25/2015 LV EF: 55% -   60%  Study Conclusions  - Left ventricle: The cavity  size was normal. Wall thickness was   increased in a pattern of mild LVH. Systolic function was normal.   The estimated ejection fraction was in the range of 55% to 60%.   Regional wall motion abnormalities cannot be excluded. Doppler   parameters are consistent with abnormal left ventricular   relaxation (grade 1 diastolic dysfunction). - Mitral valve: Calcified annulus. - Left atrium: The atrium was mildly dilated.  Impressions:  - Technically difficult; LV function appears to be preserved; focal   wall motion abnormality cannot be excluded; grade 1 diastolic   dysfunction; trace MR; mild LAE.    Cath 05/30/2015 Conclusion    Prox Cx lesion, 20% stenosed.  2nd Mrg lesion, 30% stenosed.  Prox RCA lesion, 30% stenosed.  Dist RCA lesion, 30% stenosed.  Post Atrio lesion, 99% stenosed. Post intervention, there is a 0% residual stenosis.   Significant coronary calcification involving the LAD, left circumflex, and RCA.  Widely patent stent extending from the LAD ostium to the mid LAD.  Widely patent stent in the left circumflex coronary artery with mild 20% narrowing prior to the stented segment and 30% narrowing at the ostium of the OM vessel.  Diffusely calcified proximal to mid RCA with proximal and mid 30% stenoses with 30% narrowing in the region of the acute margin but evidence for new 99%  stenosis in a noncalcified distal RCA segment immediately after the PDA takeoff.  Successful PCI to the distal RCA stenosis with a long unsuccessful attempt at trying to place a stent at this site due to the significant calcification in the mid and acute margin region of the RCA, but with successful PTCA with a 99% stenosis being reduced to 0%.  RECOMMENDATION: The patient should continue on dual antiplatelet therapy indefinitely.  Aggressive hydration post procedure in this patient with a solitary kidney.     ASSESSMENT:    1. DOE (dyspnea on exertion)   2. Unstable angina  ([place])   3. Pre-op testing   4. Coronary artery disease involving native coronary artery of native heart with unstable angina pectoris ([place])   5. Essential hypertension   6. Hyperlipidemia, unspecified hyperlipidemia type      PLAN:  In order of problems listed above:  1. Dyspnea on exertion concerning for anginal equivalent: This is the same angina symptoms he felt prior to April 2017 cardiac catheterization. He had an negative Myoview in April 2017 prior to cardiac catheterization revealing 99% stenosis. Dr. [name] has attempted to titrate medical therapy, however his symptom has not improved. Given falsely negative Myoview, we recommend a cardiac catheterization this time. Dr. [name] will not be back in the cath lab until July 11, patient opted to have cardiac catheterization sooner.  - Risk and benefit of procedure explained to the patient who display clear understanding and agree to proceed.  Discussed with patient possible procedural risk include bleeding, vascular injury, renal injury, arrythmia, MI, stroke and loss of limb or life.  2. CAD: Last cardiac catheterization in April 2017 showed widely patent stents in LAD and left circumflex vessel, RCA was diffusely diseased with calcification in the proximal and mid region  with distal 99% stenosis and a noncalcified distal RCA segment immediately after the PDA takeoff. We were unable to place a stent in the area, ultimately he underwent successful PTCA of the 99% stenosis.  3. HTN: Blood pressure stable, 122/74 today. Imdur was uptitrated during the last office visit without significant affect on the symptom.  4. HLD: On Zetia and Livalo    Medication Adjustments/Labs and Tests Ordered: Current medicines are reviewed at length with the patient today.  Concerns regarding medicines are outlined above.  Medication changes, Labs and Tests ordered today are listed in the Patient Instructions below. Patient Instructions    Medication  Instructions:  START ASPIRIN 81MG  DAILY  If you need a refill on your cardiac medications before your next appointment, please call your pharmacy.   Thank you for choosing [facility] at [facility]!!         [place] [address] [place] [address] Dept: [phone] Loc: [place]  08/01/2016  You are scheduled for a Cardiac Catheterization on Monday, July 2 with Dr. Peter Martinique.  1. Please arrive at the [facility] (Main Entrance A) at [facility]: [id number] [place], [place] [address] at 8:00 AM (two hours before your procedure to ensure your preparation). Free valet parking service is available.  Special note: Every effort is made to have your procedure done on time. Please understand that emergencies sometimes delay scheduled procedures. 2. Diet: Do not eat or drink anything after midnight prior to your procedure except sips of water to take medications.  3. Labs: LAB TODAY HERE IN OUT OFFICE AT [facility]  4. Medication instructions in preparation for your procedure:  On the morning of your procedure, take your Aspirin and your regular morning medicines.  You may use sips of water. 5. Plan for one night stay--bring personal belongings. 6. Bring a current list of your medications and current insurance cards. 7. You MUST have a responsible person to drive you home. 8. Someone MUST be with you the first 24 hours after you arrive home or your discharge will be delayed. 9. Please wear clothes that are easy to get on and off and wear slip-on shoes. Thank you for allowing [place] to care for you!   -- [facility] Invasive Cardiovascular services      Signed, Almyra Deforest, [place]  08/02/2016 12:01 AM    [place], [place], [place]  [address] Phone: [phone]; Fax: [phone]

## 2016-08-05 NOTE — Interval H&P Note (Signed)
History and Physical Interval Note:  08/05/2016 10:32 AM  Nicholas Douglas  has presented today for surgery, with the diagnosis of unstable angina  The various methods of treatment have been discussed with the patient and family. After consideration of risks, benefits and other options for treatment, the patient has consented to  Procedure(s): Left Heart Cath and Coronary Angiography (N/A) as a surgical intervention .  The patient's history has been reviewed, patient examined, no change in status, stable for surgery.  I have reviewed the patient's chart and labs.  Questions were answered to the patient's satisfaction.    Cath Lab Visit (complete for each Cath Lab visit)  Clinical Evaluation Leading to the Procedure:   ACS: No.  Non-ACS:    Anginal Classification: CCS II  Anti-ischemic medical therapy: Maximal Therapy (2 or more classes of medications)  Non-Invasive Test Results: No non-invasive testing performed  Prior CABG: No previous CABG       Nicholas Douglas [facility] 08/05/2016 10:32 AM

## 2016-08-06 ENCOUNTER — Ambulatory Visit: Payer: Medicare Other | Admitting: Cardiology

## 2016-08-06 ENCOUNTER — Encounter (HOSPITAL_COMMUNITY): Payer: Self-pay | Admitting: Cardiology

## 2016-08-22 ENCOUNTER — Encounter: Payer: Self-pay | Admitting: Cardiovascular Disease

## 2016-08-23 ENCOUNTER — Telehealth: Payer: Self-pay

## 2016-08-23 MED ORDER — IRBESARTAN 150 MG PO TABS: 150.0000 mg | ORAL_TABLET | Freq: Every day | ORAL | 6 refills | Status: DC

## 2016-08-23 NOTE — Telephone Encounter (Signed)
Spoke to patient Valsartan 160 mg stopped changed to Irbesartan 150 mg daily.Advised to monitor B/P every 2 to 3 days for 2 weeks.Advised to call back if B/P elevated.

## 2016-08-23 NOTE — Telephone Encounter (Signed)
ok 

## 2016-08-27 ENCOUNTER — Telehealth: Payer: Self-pay | Admitting: *Deleted

## 2016-08-27 ENCOUNTER — Other Ambulatory Visit: Payer: Self-pay | Admitting: *Deleted

## 2016-08-27 DIAGNOSIS — E876 Hypokalemia: Secondary | ICD-10-CM

## 2016-08-27 LAB — COMPREHENSIVE METABOLIC PANEL
A/G RATIO: 1.8 (ref 1.2–2.2)
ALT: 44 IU/L (ref 0–44)
AST: 48 IU/L — ABNORMAL HIGH (ref 0–40)
Albumin: 4.8 g/dL — ABNORMAL HIGH (ref 3.5–4.7)
Alkaline Phosphatase: 55 IU/L (ref 39–117)
BILIRUBIN TOTAL: 0.7 mg/dL (ref 0.0–1.2)
BUN / CREAT RATIO: 12 (ref 10–24)
BUN: 16 mg/dL (ref 8–27)
CALCIUM: 9.4 mg/dL (ref 8.6–10.2)
CHLORIDE: 102 mmol/L (ref 96–106)
CO2: 26 mmol/L (ref 20–29)
Creatinine, Ser: 1.3 mg/dL — ABNORMAL HIGH (ref 0.76–1.27)
GFR, EST AFRICAN AMERICAN: 58 mL/min/{1.73_m2} — AB (ref >59)
GFR, EST NON AFRICAN AMERICAN: 50 mL/min/{1.73_m2} — AB (ref >59)
GLOBULIN, TOTAL: 2.7 g/dL (ref 1.5–4.5)
Glucose: 117 mg/dL — ABNORMAL HIGH (ref 65–99)
POTASSIUM: 5.7 mmol/L — AB (ref 3.5–5.2)
SODIUM: 142 mmol/L (ref 134–144)
TOTAL PROTEIN: 7.5 g/dL (ref 6.0–8.5)

## 2016-08-27 LAB — CBC
HEMATOCRIT: 48.1 % (ref 37.5–51.0)
Hemoglobin: 15.8 g/dL (ref 13.0–17.7)
MCH: 29.9 pg (ref 26.6–33.0)
MCHC: 32.8 g/dL (ref 31.5–35.7)
MCV: 91 fL (ref 79–97)
Platelets: 234 10*3/uL (ref 150–379)
RBC: 5.29 x10E6/uL (ref 4.14–5.80)
RDW: 13.8 % (ref 12.3–15.4)
WBC: 6.2 10*3/uL (ref 3.4–10.8)

## 2016-08-27 LAB — LIPID PANEL
CHOL/HDL RATIO: 2.8 ratio (ref 0.0–5.0)
Cholesterol, Total: 144 mg/dL (ref 100–199)
HDL: 52 mg/dL (ref >39)
LDL Calculated: 77 mg/dL (ref 0–99)
Triglycerides: 74 mg/dL (ref 0–149)
VLDL Cholesterol Cal: 15 mg/dL (ref 5–40)

## 2016-08-27 NOTE — Telephone Encounter (Signed)
Patient notified of lab results and recommendations. He will come and have labs drawn on Thursday. B-met ordered.

## 2016-08-27 NOTE — Telephone Encounter (Signed)
-----   Message from [name], MD sent at 08/27/2016  9:12 AM EDT ----- K 5.7; hold valsartan; dc foods with K; re-check Bmet this week

## 2016-08-29 LAB — BASIC METABOLIC PANEL
BUN/Creatinine Ratio: 15 (ref 10–24)
BUN: 20 mg/dL (ref 8–27)
CO2: 23 mmol/L (ref 20–29)
Calcium: 9.7 mg/dL (ref 8.6–10.2)
Chloride: 101 mmol/L (ref 96–106)
Creatinine, Ser: 1.3 mg/dL — ABNORMAL HIGH (ref 0.76–1.27)
GFR, EST AFRICAN AMERICAN: 58 mL/min/{1.73_m2} — AB (ref >59)
GFR, EST NON AFRICAN AMERICAN: 50 mL/min/{1.73_m2} — AB (ref >59)
Glucose: 108 mg/dL — ABNORMAL HIGH (ref 65–99)
POTASSIUM: 5.4 mmol/L — AB (ref 3.5–5.2)
SODIUM: 140 mmol/L (ref 134–144)

## 2016-09-05 ENCOUNTER — Ambulatory Visit (INDEPENDENT_AMBULATORY_CARE_PROVIDER_SITE_OTHER): Payer: Medicare Other | Admitting: Cardiovascular Disease

## 2016-09-05 ENCOUNTER — Encounter: Payer: Self-pay | Admitting: Cardiovascular Disease

## 2016-09-05 VITALS — BP 122/84 | HR 55 | Ht 67.5 in | Wt 197.0 lb

## 2016-09-05 DIAGNOSIS — I700 Atherosclerosis of aorta: Secondary | ICD-10-CM

## 2016-09-05 DIAGNOSIS — I10 Essential (primary) hypertension: Secondary | ICD-10-CM

## 2016-09-05 DIAGNOSIS — E875 Hyperkalemia: Secondary | ICD-10-CM

## 2016-09-05 DIAGNOSIS — E785 Hyperlipidemia, unspecified: Secondary | ICD-10-CM | POA: Diagnosis not present

## 2016-09-05 DIAGNOSIS — R0602 Shortness of breath: Secondary | ICD-10-CM | POA: Diagnosis not present

## 2016-09-05 DIAGNOSIS — I25118 Atherosclerotic heart disease of native coronary artery with other forms of angina pectoris: Secondary | ICD-10-CM | POA: Diagnosis not present

## 2016-09-05 LAB — COMPREHENSIVE METABOLIC PANEL
ALBUMIN: 4.6 g/dL (ref 3.5–4.7)
ALK PHOS: 59 IU/L (ref 39–117)
ALT: 36 IU/L (ref 0–44)
AST: 38 IU/L (ref 0–40)
Albumin/Globulin Ratio: 1.8 (ref 1.2–2.2)
BUN / CREAT RATIO: 13 (ref 10–24)
BUN: 17 mg/dL (ref 8–27)
Bilirubin Total: 0.9 mg/dL (ref 0.0–1.2)
CO2: 23 mmol/L (ref 20–29)
CREATININE: 1.27 mg/dL (ref 0.76–1.27)
Calcium: 9.7 mg/dL (ref 8.6–10.2)
Chloride: 102 mmol/L (ref 96–106)
GFR calc non Af Amer: 52 mL/min/{1.73_m2} — ABNORMAL LOW (ref >59)
GFR, EST AFRICAN AMERICAN: 60 mL/min/{1.73_m2} (ref >59)
GLOBULIN, TOTAL: 2.6 g/dL (ref 1.5–4.5)
Glucose: 81 mg/dL (ref 65–99)
Potassium: 4.7 mmol/L (ref 3.5–5.2)
Sodium: 141 mmol/L (ref 134–144)
Total Protein: 7.2 g/dL (ref 6.0–8.5)

## 2016-09-05 MED ORDER — ATENOLOL 25 MG PO TABS: 12.5000 mg | ORAL_TABLET | Freq: Every day | ORAL | 3 refills | Status: DC

## 2016-09-05 MED ORDER — ISOSORBIDE MONONITRATE ER 60 MG PO TB24: 30.0000 mg | ORAL_TABLET | Freq: Every day | ORAL | 3 refills | Status: DC

## 2016-09-05 NOTE — Patient Instructions (Signed)
Your physician recommends that you return for lab work TODAY.  Your physician has recommended you make the following change in your medication:   1.) the atenolol has been changed to 1/2 tablet at night.  2.) the isosorbide has been changed to 1/2 tablet daily.   Your physician recommends that you schedule a follow-up appointment in: 2 months with Dr [name].

## 2016-09-05 NOTE — Progress Notes (Addendum)
Patient ID: Nicholas Douglas, male   DOB: 10/14/1931, 83 y.o.   MRN: [id number]     HPI: Nicholas Douglas is an 84 y.o. Caucasian male who presents to the office today for a 2 month evaluation.  Nicholas Douglas has known CAD and underwent numerous interventions dating back to 80, 1991, 1993, and in 1997. Cardiac catheterization in 2011 which showed preserved LV function with mild residual distal inferior apical hypocontractility. There is evidence for coronary calcification with segmental narrowing of his LAD of 30-40% proximally, 50% diffusely, in the midsegment, and 70-80% in the distal region, he had AV groove circumflex stenoses of 70 and 50% with a 40% OM 2 stenosis, and a 30-40 and 50% RCA stenoses with 80-90% stenosis in the acute marginal branch. He had been on medical therapy.  He  presented to [facility] in March 2015 with class IV angina and catheterization demonstrated severe 2 vessel CAD with significant current calcification of the LAD with up to 95% mid LAD stenosis and diffuse proximal stenosis. He underwent successful high-speed rotational atherectomy the following day by Dr. Martinique and a 1.5 mm bur was used. Mid LAD was stented with a 2.75x38 mm Promus stent in the proximal LAD was stented with a 3.0x28 mm Promus stent. Medical therapy was recommended for his distal LAD stenosis. He has only one kidney and staged intervention to the left circumflex coronary artery was recommended.  He underwent staged left circumflex PCI on 04/27/2013 by me with successful high-speed rotational atherectomy with a 1.5 and 1.75 mm burr, and ultimately had a 2.5x28 mm Promus premier DES stent inserted into the circumflex vessel, which was post dilated to approximately 2.6 mm.  Subsequently he has felt significantly improved with resolution of any chest pain  He  has a history of significant hyperlipidemia and has had significant increased number of small LDL particles and insulin resistance. This seemed to markedly  improve with the addition of Niaspan added to zetia and lovaza.  Remotely, he had been on statins consisting of Lipitor and subsequently Crestor. He did have transient LFT elevation. He also concerns of possible risk of developing dementia with statin therapy.  He had  some episodes of Niaspan induced diffuse flushing. He wanted to stop taking his Niaspan. He did have subsequent NMR off Niaspan and done just on Zetia and as well as 2 g of Lovaza.  This showed increased abnormalities such that his total cholesterol was 201, LDL had risen to 124, but he now had LDL small particles which have increased from 685 to 1348. Laboratory on 10/26/2012: LDL particle number was elevated at 1657, LDL cholesterol 112 triglycerides 155 and total cholesterol 190. He continued to be insulin resistant with insulin resistance scored 65. When I last saw him, we elected to try Livalo and ultimately titrated this to 4 mg daily to take in addition to his Zetia 10 mg. Laboratory on 02/22/2013: LDL particle number was  markedly improved at 774 with an HDL of 40 calculated LDL 50 small LDL particle #417. Insulin resistance score was also improved at 51.  In July 2015 he developed abdominal discomfort and was found to have acute appendicitis. On CT imaging he was also noted to have prostate enlargement with nodularity and thickening of the bladder base and cystoscopy was suggested for further evaluation. He is status post left nephrectomy. He also was noted to have a 2.3 cm infrarenal suprailiac abdominal aortic aneurysm.  He has been on Livalo 2 mg, Zetia  10 mg for hyperlipidemia.  Lab work on 12/22/2013 showed a cholesterol 155, triglycerides 130, HDL 45, LDL 84.  He has been on amlodipine 10 mg atenolol 12.5 mg twice a day and valsartan 80 mg daily for blood pressure.  He continues to be on aspirin and Plavix for dual antiplatelet therapy.  A nuclear perfusion study on 06/30/2014  revealed normal perfusion and function with an ejection  fraction of 57%.    When he underwent a CT scan for his appendicitis he was told of having a small abdominal aortic aneurysm.  His mother also had an abdominal aortic aneurysm.  The patient recently sprained his right ankle.  As result, he has not been able to be as active as he had in the past and has not been able to play golf which typically he had played up to 4 times per week, often scoring in the 70s.    He was recently admitted to the hospital in April 2017 with complaints of increasing episodes of chest discomfort.  Troponins were mildly positive at 0.12, consistent with a non-STEMI.  I performed cardiac catheterization on 05/28/2015.  This showed widely patent stents in the LAD and circumflex vessels.  The RCA was diffusely diseased with calcification in the proximal to mid region with distal 99% stenosis and a noncalcified distal RCA segment immediately after the PDA takeoff.  ECI was difficult due to the calcified RCA preventing placement of a stent since the stent was never able to be passed beyond this calcified angled segment despite even attempting guide liner support.  Ultimately, he underwent successful PTCA of the 99% stenosis, which was reduced to 0%.  Since his intervention he had noticed dramatic improvement in his previous symptomatology.  He continues to feel well and is playing golf 3 times per week. He had a basal cell cancer removed from his nose.   When I saw him in April 2018 he was without chest pain or significant shortness of breath and was playing golf at least 3 days per week.  Laboratory showed a total cholesterol 147, triglycerides 151, HDL 43, LDL 74 on regimen of Zetia 10 mg and Livalo 2 mg.  In the past he's been intolerant to Crestor, Lipitor, and Zocor.  I further titrated Livalo 24 mg daily which  he has tolerated. An abdominal ultrasound showed aortoiliac atherosclerosis without aneurysm.   When I saw him in June he had begun to notice increasing shortness of  breath with walking. He denies any of the chest tightness or pressure that he had experienced prior to his interventions.  At his last catheterization, he had extensive calcification in his RCA and PTCA alone was done since the stent was unable to reach the subtotal distal RCA.  He denies recurrent chest tightness.  He was questioning whether or not he could have had  pneumonia comtributing to his shortness of breath.  At that evaluation, I further titrated his Imdur to 60 mg daily.  His blood pressure was also elevated and I titrated his ARB therapy.  He sagittally saw Nicholas Douglas as an add-on in with complaints of increasing symptomatology definitive cardiac catheterization was recommended.  He underwent repeat cardiac catheterization on 08/05/2016 by Dr. Peter Martinique.  This continues to show patency of the stents in the proximal to mid LAD and patency of the stented the mid circumflex as well as patent PTCA site of the PL OM vessel.  There was no new disease to explain his symptoms.  He  had normal LV function and normal left ventricular end-diastolic pressure.  Following the catheterization, he has felt well, but he continues to experience some shortness of breath with weakness and decreased energy.  He denies any chest tightness.  Recent laboratory done 10 days ago revealed an elevated potassium at 5.7.  On recheck 3 days later was 5.4.  Subsequent, he has been taken off ARB therapy.  He presents now for follow-up evaluation.  Past Medical History:  Diagnosis Date  . BPH (benign prostatic hyperplasia)    Followed by Nicholas Douglas/urology  . CAD S/P PTCA only of RPAV-PL 05/31/2015   99% --> 0%PAV - PTCA only (unable to advance STENT) due to RCA calcification - prox & distal RCA 30%; Patent LAD stent & Cx stent (~20% ISR).   . Calculus of kidney    "that's how they found the cancer" (04/27/2013)  . Coronary artery disease involving native coronary artery of native heart with unstable angina pectoris  ([place]) 12/03/2011   S/P Cardiac angioplasty 1986, 1991, 1993, 1997.  Last cardiac catheterization 2001.  Cardiolite 05/2011 low risk with normal EF 65%.  Followed by cardiology/[facility] every six months.   . History of myocardial infarction 1976  . Hypertension   . Impotence of organic origin   . Internal hemorrhoids without mention of complication   . Malignant neoplasm of kidney, except pelvis   . Melanoma ([place]) 06/2010   Left arm  . 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  . Other abnormal blood chemistry   . Other nonspecific abnormal serum enzyme levels   . Personal history of colonic polyps   . Pure hypercholesterolemia   . Tobacco use disorder   . Unspecified congenital cystic kidney disease   . Unstable angina ([place]) 04/05/2013; 05/2015    Past Surgical History:  Procedure Laterality Date  . APPENDECTOMY  2014  . CARDIAC CATHETERIZATION N/A 05/30/2015   Procedure: Left Heart Cath and Coronary Angiography;  Surgeon: [name], MD;  Location: [place] [facility];  Service: Cardiovascular;  Laterality: N/A;  . CARDIAC CATHETERIZATION N/A 05/30/2015   Procedure: Coronary Balloon Angioplasty;  Surgeon: [name], MD;  Location: [place] [facility];  Service: Cardiovascular;  Laterality: N/A;  . Cardiolite  05/06/2011   low risk study; normal EF.  [facility]  . carotid dopplers  03/08/2011   minimal plaque formation B. Symptoms: dizziness.  . COLONOSCOPY  10/05/2008   single polyp, [facility].  [name].  Repeat in 3 years.  . CORONARY ANGIOPLASTY     "I've had 4" (04/27/2013)  . CORONARY ANGIOPLASTY WITH STENT PLACEMENT  04/2013; 04/27/2013   "2 + 1" (04/27/2013)  . EYE SURGERY  11/04/2013   Cataract surgery B; [place].  Marland Kitchen LEFT HEART CATH AND CORONARY ANGIOGRAPHY N/A 08/05/2016   Procedure: Left Heart Cath and Coronary Angiography;  Surgeon: Martinique, Peter M, MD;  Location: [place] [facility];  Service: Cardiovascular;  Laterality: N/A;  . MELANOMA EXCISION Left ~ 2007     "arm"  . MELANOMA EXCISION  02/07/2015   R upper back. [facility]  . NEPHRECTOMY Left 2006  . PERCUTANEOUS CORONARY ROTOBLATOR INTERVENTION (PCI-R) N/A 04/06/2013   Procedure: PERCUTANEOUS CORONARY ROTOBLATOR INTERVENTION (PCI-R);  Surgeon: [name], MD;  Location: [facility] C[facility] LAB;  Service: Cardiovascular;  Laterality: N/A;  . PERCUTANEOUS CORONARY STENT INTERVENTION (PCI-S) N/A 04/27/2013   Procedure: PERCUTANEOUS CORONARY STENT INTERVENTION (PCI-S);  Surgeon: [name], MD;  Location:  [facility] C[facility] LAB;  Service: Cardiovascular;  Laterality: N/A;    Allergies  Allergen Reactions  . Lovastatin Other (See Comments)    Elevated liver enzymes  . Morphine Hives  . Nifedipine Other (See Comments)    Elevated liver enzymes    Current Outpatient Prescriptions  Medication Sig Dispense Refill  . acetaminophen (TYLENOL) 500 MG tablet Take 500 mg by mouth daily as needed for moderate pain.    Marland Kitchen amLODipine (NORVASC) 10 MG tablet Take 1 tablet (10 mg total) by mouth daily. (Patient taking differently: Take 10 mg by mouth every evening. ) 90 tablet 3  . aspirin 81 MG tablet Take 81 mg by mouth every evening.     Marland Kitchen atenolol (TENORMIN) 25 MG tablet Take 0.5 tablets (12.5 mg total) by mouth at bedtime. TAKE ONE-HALF (1/2) TABLET TWICE A DAY 90 tablet 3  . Carboxymethylcell-Hypromellose (GENTEAL OP) Apply 1 drop to eye 3 (three) times daily as needed (dry eyes).    . clopidogrel (PLAVIX) 75 MG tablet Take 1 tablet (75 mg total) by mouth daily. 90 tablet 3  . ezetimibe (ZETIA) 10 MG tablet Take 1 tablet (10 mg total) by mouth daily. 90 tablet 3  . finasteride (PROSCAR) 5 MG tablet Take 1 tablet (5 mg total) by mouth daily. (Patient taking differently: Take 2.5 mg by mouth daily. ) 90 tablet 3  . Icosapent Ethyl 1 g CAPS Take 1 g by mouth 2 (two) times daily. 180 capsule 1  . irbesartan (AVAPRO) 150 MG tablet Take 1 tablet (150 mg total) by mouth daily. 30 tablet 6  . isosorbide mononitrate (IMDUR) 60 MG 24 hr  tablet Take 0.5 tablets (30 mg total) by mouth daily. 90 tablet 3  . latanoprost (XALATAN) 0.005 % ophthalmic solution Place 1 drop into both eyes at bedtime.  1  . nitroGLYCERIN (NITROSTAT) 0.4 MG SL tablet Place 1 tablet (0.4 mg total) under the tongue every 5 (five) minutes as needed for chest pain. 25 tablet 2  . Pitavastatin Calcium (LIVALO) 4 MG TABS Take 1 tablet (4 mg total) by mouth daily. (Patient taking differently: Take 1 tablet by mouth at bedtime. ) 90 tablet 3   No current facility-administered medications for this visit.     Socially he remains active. He is married has 5 children 10 grandchildren 2 great-grandchildren. Is no alcohol use. He typically scores below 75 and plays golf 3 days per week.  He typically scores in the 70s, better than his age.  ROS General: Negative; No fevers, chills, or night sweats;  HEENT: Negative; No changes in vision or hearing, sinus congestion, difficulty swallowing Pulmonary: Positive for shortness of breath Cardiovascular: See history of present illness;  GI: Negative; No nausea, vomiting, diarrhea, or abdominal pain GU: Negative; No dysuria, hematuria, or difficulty voiding Musculoskeletal: Negative; no myalgias, joint pain, or weakness Hematologic/Oncology: Negative; no easy bruising, bleeding Endocrine: Negative; no heat/cold intolerance; no diabetes Neuro: Negative; no changes in balance, headaches Skin: Negative; No rashes or skin lesions Psychiatric: Negative; No behavioral problems, depression Sleep: Negative; No snoring, daytime sleepiness, hypersomnolence, bruxism, restless legs, hypnogognic hallucinations, no cataplexy Other comprehensive 14 point system review is negative.   PE BP 122/84   Pulse (!) 55   Ht 5' 7.5" (1.715 m)   Wt 197 lb (89.4 kg)   BMI 30.40 kg/m    Repeat blood pressure 150/84  Wt Readings from Last 3 Encounters:  09/05/16 197 lb (89.4 kg)  08/05/16 196 lb (88.9 kg)  08/01/16 198 lb 3.2 oz (89.9  kg)   General: Alert, oriented, no distress.  Skin: normal turgor, no rashes, warm and dry HEENT: Normocephalic, atraumatic. Pupils equal round and reactive to light; sclera anicteric; extraocular muscles intact;  Nose without nasal septal hypertrophy Mouth/Parynx benign; Mallinpatti scale 2 Neck: No JVD, no carotid bruits; normal carotid upstroke Lungs: clear to ausculatation and percussion; no wheezing or rales Chest wall: without tenderness to palpitation Heart: PMI not displaced, RRR, s1 s2 normal, 1/6 systolic murmur, no diastolic murmur, no rubs, gallops, thrills, or heaves Abdomen: soft, nontender; no hepatosplenomehaly, BS+; abdominal aorta nontender and not dilated by palpation. Back: no CVA tenderness Pulses 2+ Musculoskeletal: full range of motion, normal strength, no joint deformities Extremities: no clubbing cyanosis or edema, Homan's sign negative  Neurologic: grossly nonfocal; Cranial nerves grossly wnl Psychologic: Normal mood and affect   ECG (independently read by me): Sinus bradycardia 55 bpm.  Normal intervals.  No significant ST-T changes  June 2018 ECG (independently read by me): Sinus bradycardia 58 bpm.  Normal intervals.  No significant ST-T changes.  April 2018 ECG (independently read by me): Normal sinus rhythm at 63 bpm.  Normal intervals.  No ST segment changes.  September 2017 ECG (independently read by me): Normal sinus rhythm at 60 bpm.  Nonspecific T changes.  Intervals are normal.  May 2017 ECG (independently read by me): Sinus bradycardia 55 bpm.  No ectopy.  Normal intervals.  Nondiagnostic T changes in lead 3.  April 2017 ECG (independently read by me): Sinus bradycardia 55 bpm.  No ectopy.  No significant ST changes.  Normal intervals.  01/12/2015 ECG (independently read by me): Sinus bradycardia 54 bpm.  No ectopy.  Normal intervals.  June 2016 ECG (independently read by me): Sinus bradycardia 58 bpm.  Normal intervals.  No ectopy. Non-specific  T change aVL  November 2015 ECG (independently read by me): Sinus bradycardia 54 bpm.  No significant ST-T changes.  Normal intervals.  May 2015 ECG (independently read by me): Sinus bradycardia 54 beats per minute.  No ectopy.  QTc interval 396 ms.  No significant ST changes.  04/22/2013 ECG (independently read by me): Sinus bradycardia 55 beats per minute. Nonspecific ST changes  03/01/2013 ECG (independently read by me): Sinus bradycardia 52 beats per minute. Normal intervals. No significant ST changes.  Prior ECG of 11/20/2012: Sinus rhythm at 51 beats per minute. QTc interval 400 ms. No significant ST changes.  LABS:  BMP Latest Ref Rng & Units 09/05/2016 08/29/2016 08/26/2016  Glucose 65 - 99 mg/dL 81 108(H) 117(H)  BUN 8 - 27 mg/dL _0 Creatinine 0.76 - 1.27 mg/dL 1.27 1.30(H) 1.30(H)  BUN/Creat Ratio 10 - _1 Sodium 134 - 144 mmol/L 141 140 142  Potassium 3.5 - 5.2 mmol/L 4.7 5.4(H) 5.7(H)  Chloride 96 - 106 mmol/L 102 101 102  CO2 20 - 29 mmol/L _2 Calcium 8.6 - 10.2 mg/dL 9.7 9.7 9.4   Hepatic Function Latest Ref Rng & Units 09/05/2016 08/26/2016 04/29/2016  Total Protein 6.0 - 8.5 g/dL 7.2 7.5 7.1  Albumin 3.5 - 4.7 g/dL 4.6 4.8(H) 4.4  AST 0 - 40 IU/L 38 48(H) 34  ALT 0 - 44 IU/L 36 44 37  Alk Phosphatase 39 - 117 IU/L 59 55 58  Total Bilirubin 0.0 - 1.2 mg/dL 0.9 0.7 0.8  Bilirubin, Direct 0.00 - 0.40 mg/dL - - -   CBC Latest Ref Rng & Units  08/26/2016 08/01/2016 04/29/2016  WBC 3.4 - 10.8 x10E3/uL 6.2 7.5 6.6  Hemoglobin 13.0 - 17.7 g/dL 15.8 16.2 16.3  Hematocrit 37.5 - 51.0 % 48.1 47.9 48.1  Platelets 150 - 379 x10E3/uL 234 259 225   Lab Results  Component Value Date   MCV 91 08/26/2016   MCV 87 08/01/2016   MCV 88 04/29/2016   Lab Results  Component Value Date   TSH 3.820 04/29/2016  .  Lipid Panel     Component Value Date/Time   CHOL 144 08/26/2016 0816   TRIG 74 08/26/2016 0816   TRIG 131 02/22/2013 0804   HDL 52 08/26/2016 0816    HDL 40 02/22/2013 0804   CHOLHDL 2.8 08/26/2016 0816   CHOLHDL 3.5 05/09/2015 1400   VLDL 31 (H) 05/09/2015 1400   LDLCALC 77 08/26/2016 0816   LDLCALC 50 02/22/2013 0804   RADIOLOGY: Dg Chest 2 View  08/17/2012   *RADIOLOGY REPORT*  Clinical Data: Renal cell carcinoma  CHEST - 2 VIEW  Comparison: 05/08/11  Findings: The cardiomediastinal silhouette is stable.  No acute infiltrate or pleural effusion.  No pulmonary edema.  Bony thorax is unremarkable.  IMPRESSION: No active disease.  No significant change.   Original Report Authenticated By: Lahoma Crocker, M.D.    IMPRESSION:  1. Coronary artery disease involving native coronary artery of native heart with other form of angina pectoris ([place])   2. Essential hypertension, benign   3. SOB (shortness of breath)   4. Hyperlipidemia, unspecified hyperlipidemia type   5. Atherosclerosis of aorta ([place])   6. Hyperkalemia     ASSESSMENT AND PLAN: Mr. Dacari Beckstrand is a young appearing 83 year old gentleman who has CAD dating back to 58 and has undergone multiple interventions in the past over a 10 year period from 66 to 1997. He developed unstable angina symptomatology in March 2015 and catheterization done initially at [facility] showed multivessel disease.  He underwent successful staged rotational coronary atherectomy initially involving the RCA in early March, and then on 04/27/2013 repeat intervention was done to the left circumflex system with rotational atherectomy.  At that time, he had a widely patent stent of his RCA, as well as a widely patent stent in his LAD.  He also had 60-70% mid LAD stenosis, which had not progressed. In 2017 he noticed a change in symptomatology with the development of more shortness of breath, and no energy and felt occasional episodes of vague chest discomfort with possible left arm radiation.  He was hospitalized in late April 2017 and was found to have progressive 99% distal RCA stenosis after the PDA takeoff.  His  stents in the LAD and circumflex were widely patent.  There was mild concomitant CAD in the circumflex.  His RCA was diffusely diseased and calcified with narrowings of 30% of the mid segment, but there was an angulated area of narrowing calcified narrowing of 40% before the acute margin, which prevented a stent from getting beyond this.  He underwent successful PTCA  to the distal stenosis.  Since I last saw him, he had developed worsening symptoms of fatigue, weakness and exertional dyspnea.  He underwent repeat cardiac catheterization.  While I was on vacation, which showed patent intervention sites without significant CAD progression.  There was mild 30-35% narrowing in the circumflex and mild 40% narrowing in the RCA.  The stents were patent as was the prior PTCA site.  He is now been off irbesartan, which was changed from valsartan due to his recent  elevation of potassium.  His blood pressure today continues to be stable at 122/84.  He complains of significant weakness and decreased energy.  I have recommended reduction of his atenolol dose and he will change this from 12.50 g twice a day to just 12.5 mg at bedtime.  I will further reduce his isosorbide mononitrate to 30 mg based on his most recent cath findings.  I will recheck a chemistry profile today to assess his chemistry, renal function, potassium, and lipid liver studies.  He continues to be on Livalo for hyperlipidemia.  I will see him in the office in 2-3 months for reevaluation.   Time spent: 25 minutes  [name], MD, [facility]  09/06/2016 6:24 PM

## 2016-09-16 ENCOUNTER — Encounter: Payer: Self-pay | Admitting: Cardiovascular Disease

## 2016-09-16 MED ORDER — ICOSAPENT ETHYL 1 G PO CAPS: 1.0000 g | ORAL_CAPSULE | Freq: Two times a day (BID) | ORAL | 1 refills | Status: DC

## 2016-11-21 ENCOUNTER — Ambulatory Visit (INDEPENDENT_AMBULATORY_CARE_PROVIDER_SITE_OTHER): Payer: Medicare Other | Admitting: Cardiovascular Disease

## 2016-11-21 ENCOUNTER — Encounter: Payer: Self-pay | Admitting: Cardiovascular Disease

## 2016-11-21 VITALS — BP 152/84 | HR 57 | Ht 67.5 in | Wt 192.0 lb

## 2016-11-21 DIAGNOSIS — N183 Chronic kidney disease, stage 3 unspecified: Secondary | ICD-10-CM

## 2016-11-21 DIAGNOSIS — Z79899 Other long term (current) drug therapy: Secondary | ICD-10-CM | POA: Diagnosis not present

## 2016-11-21 DIAGNOSIS — E785 Hyperlipidemia, unspecified: Secondary | ICD-10-CM

## 2016-11-21 DIAGNOSIS — I2510 Atherosclerotic heart disease of native coronary artery without angina pectoris: Secondary | ICD-10-CM | POA: Diagnosis not present

## 2016-11-21 DIAGNOSIS — R5383 Other fatigue: Secondary | ICD-10-CM

## 2016-11-21 DIAGNOSIS — I10 Essential (primary) hypertension: Secondary | ICD-10-CM

## 2016-11-21 MED ORDER — ATENOLOL 25 MG PO TABS: 12.5000 mg | ORAL_TABLET | Freq: Every day | ORAL | 3 refills | Status: DC

## 2016-11-21 NOTE — Patient Instructions (Signed)
Medication Instructions:  Your physician recommends that you continue on your current medications as directed. Please refer to the Current Medication list given to you today.  Labwork: Please return for FASTING labs in 6 months prior to OV (CMET, CBC, Lipid, TSH).  Our in office lab hours are Monday-Friday 8:00-4:30, closed for lunch 1-2 pm.  No appointment needed.  Follow-Up: Your physician wants you to follow-up in: 6 months with Dr. [name]. You will receive a reminder letter in the mail two months in advance. If you don't receive a letter, please call our office to schedule the follow-up appointment.   Any Other Special Instructions Will Be Listed Below (If Applicable).     If you need a refill on your cardiac medications before your next appointment, please call your pharmacy.

## 2016-11-21 NOTE — Progress Notes (Signed)
Patient ID: Nicholas Douglas, male   DOB: Dec 17, 1931, 83 y.o.   MRN: [id number]     HPI: Nicholas Douglas is an 83 y.o. Caucasian male who presents to the office today for a 2 month evaluation.  Nicholas Douglas has known CAD and underwent numerous interventions dating back to 8, 1991, 1993, and in 1997. Cardiac catheterization in 2011 which showed preserved LV function with mild residual distal inferior apical hypocontractility. There is evidence for coronary calcification with segmental narrowing of his LAD of 30-40% proximally, 50% diffusely, in the midsegment, and 70-80% in the distal region, he had AV groove circumflex stenoses of 70 and 50% with a 40% OM 2 stenosis, and a 30-40 and 50% RCA stenoses with 80-90% stenosis in the acute marginal branch. He had been on medical therapy.  He  presented to [facility] in March 2015 with class IV angina and catheterization demonstrated severe 2 vessel CAD with significant current calcification of the LAD with up to 95% mid LAD stenosis and diffuse proximal stenosis. He underwent successful high-speed rotational atherectomy the following day by Dr. Martinique and a 1.5 mm bur was used. Mid LAD was stented with a 2.75x38 mm Promus stent in the proximal LAD was stented with a 3.0x28 mm Promus stent. Medical therapy was recommended for his distal LAD stenosis. He has only one kidney and staged intervention to the left circumflex coronary artery was recommended.  He underwent staged left circumflex PCI on 04/27/2013 by me with successful high-speed rotational atherectomy with a 1.5 and 1.75 mm burr, and ultimately had a 2.5x28 mm Promus premier DES stent inserted into the circumflex vessel, which was post dilated to approximately 2.6 mm.  Subsequently he has felt significantly improved with resolution of any chest pain  He  has a history of significant hyperlipidemia and has had significant increased number of small LDL particles and insulin resistance. This seemed to markedly  improve with the addition of Niaspan added to zetia and lovaza.  Remotely, he had been on statins consisting of Lipitor and subsequently Crestor. He did have transient LFT elevation. He also concerns of possible risk of developing dementia with statin therapy.  He had  some episodes of Niaspan induced diffuse flushing. He wanted to stop taking his Niaspan. He did have subsequent NMR off Niaspan and done just on Zetia and as well as 2 g of Lovaza.  This showed increased abnormalities such that his total cholesterol was 201, LDL had risen to 124, but he now had LDL small particles which have increased from 685 to 1348. Laboratory on 10/26/2012: LDL particle number was elevated at 1657, LDL cholesterol 112 triglycerides 155 and total cholesterol 190. He continued to be insulin resistant with insulin resistance scored 65. When I last saw him, we elected to try Livalo 81 and ultimately titrated this to 4 mg daily to take in addition to his Zetia 10 mg. Laboratory on 02/22/2013: LDL particle number was  markedly improved at 774 with an HDL of 40 calculated LDL 50 small LDL particle #417. Insulin resistance score was also improved at 51.  In July 2015 he developed abdominal discomfort and was found to have acute appendicitis. On CT imaging he was also noted to have prostate enlargement with nodularity and thickening of the bladder base and cystoscopy was suggested for further evaluation. He is status post left nephrectomy. He also was noted to have a 2.3 cm infrarenal suprailiac abdominal aortic aneurysm.  He has been on Livalo 2 mg, Zetia  10 mg for hyperlipidemia.  Lab work on 12/22/2013 showed a cholesterol 155, triglycerides 130, HDL 45, LDL 84.  He has been on amlodipine 10 mg atenolol 12.5 mg twice a day and valsartan 80 mg daily for blood pressure.  He continues to be on aspirin and Plavix for dual antiplatelet therapy.  A nuclear perfusion study on 06/30/2014  revealed normal perfusion and function with an ejection  fraction of 57%.    When he underwent a CT scan for his appendicitis he was told of having a small abdominal aortic aneurysm.  His mother also had an abdominal aortic aneurysm.  The patient recently sprained his right ankle.  As result, he has not been able to be as active as he had in the past and has not been able to play golf which typically he had played up to 4 times per week, often scoring in the 70s.    He was recently admitted to the hospital in April 2017 with complaints of increasing episodes of chest discomfort.  Troponins were mildly positive at 0.12, consistent with a non-STEMI.  I performed cardiac catheterization on 81/23/2017.  This showed widely patent stents in the LAD and circumflex vessels.  The RCA was diffusely diseased with calcification in the proximal to mid region with distal 99% stenosis and a noncalcified distal RCA segment immediately after the PDA takeoff.  ECI was difficult due to the calcified RCA preventing placement of a stent since the stent was never able to be passed beyond this calcified angled segment despite even attempting guide liner support.  Ultimately, he underwent successful PTCA of the 99% stenosis, which was reduced to 0%.  Since his intervention he had noticed dramatic improvement in his previous symptomatology.  He continues to feel well and is playing golf 3 times per week. He had a basal cell cancer removed from his nose.   When I saw him in April 2081 he was without chest pain or significant shortness of breath and was playing golf at least 3 days per week.  Laboratory showed a total cholesterol 147, triglycerides 151, HDL 43, LDL 74 on regimen of Zetia 10 mg and Livalo 2 mg.  In the past he's been intolerant to Crestor, Lipitor, and Zocor.  I further titrated Livalo 24 mg daily which  he has tolerated. An abdominal ultrasound showed aortoiliac atherosclerosis without aneurysm.   When I saw him in June he had begun to notice increasing shortness of  breath with walking. He denies any of the chest tightness or pressure that he had experienced prior to his interventions.  At his last catheterization, he had extensive calcification in his RCA and PTCA alone was done since the stent was unable to reach the subtotal distal RCA.  He denies recurrent chest tightness.  He was questioning whether or not he could have had  pneumonia comtributing to his shortness of breath.  At that evaluation, I further titrated his Imdur to 60 mg daily.  His blood pressure was also elevated and I titrated his ARB therapy.  He sagittally saw Caro Hight as an add-on in with complaints of increasing symptomatology definitive cardiac catheterization was recommended.  He underwent repeat cardiac catheterization on 08/05/2016 by Dr. Peter Martinique.  This continues to show patency of the stents in the proximal to mid LAD and patency of the stented the mid circumflex as well as patent PTCA site of the PL OM vessel.  There was no new disease to explain his symptoms.  He  had normal LV function and normal left ventricular end-diastolic pressure.  Following the catheterization, he has felt well, but he continues to experience some shortness of breath with weakness and decreased energy.  He denies any chest tightness.  Recent laboratory done 10 days ago revealed an elevated potassium at 5.7.  On recheck 3 days later was 5.4.  Subsequent, he has been taken off ARB therapy.    When I last saw him in August 2018.  He was complaining of significant fatigue.  His blood pressure was stable and his pulse was 55.  I recommended he reduce his atenolol from 12.5 mg twice a day to just 12.5 mg at bedtime.  I also reduced his isosorbide from 60 mg down to 30 mg in light of his recent catheterization findings.  His prior hypokalemia, resolved with discontinuance of ARD, but more importantly with discontinuance of his excessive exogenous potassium intake with certain foods.  He was eating at the time.   Over the past several months, he has more energy.  He denies fatigability.  Blood pressure recordings at home range from 782 to 956 systolically.  He presents for reevaluation.  Past Medical History:  Diagnosis Date  . BPH (benign prostatic hyperplasia)    Followed by [name]/urology  . CAD S/P PTCA only of RPAV-PL 05/31/2015   99% --> 0%PAV - PTCA only (unable to advance STENT) due to RCA calcification - prox & distal RCA 30%; Patent LAD stent & Cx stent (~20% ISR).   . Calculus of kidney    "that's how they found the cancer" (04/27/2013)  . Coronary artery disease involving native coronary artery of native heart with unstable angina pectoris ([place]) 12/03/2011   S/P Cardiac angioplasty 1986, 1991, 1993, 1997.  Last cardiac catheterization 2001.  Cardiolite 05/2011 low risk with normal EF 65%.  Followed by [facility]ology/[name] every six months.   . History of myocardial infarction 1976  . Hypertension   . Impotence of organic origin   . Internal hemorrhoids without mention of complication   . Malignant neoplasm of kidney, except pelvis   . Melanoma ([place]) 06/2010   Left arm  . 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  . Other abnormal blood chemistry   . Other nonspecific abnormal serum enzyme levels   . Personal history of colonic polyps   . Pure hypercholesterolemia   . Tobacco use disorder   . Unspecified congenital cystic kidney disease   . Unstable angina ([place]) 04/05/2013; 05/2015    Past Surgical History:  Procedure Laterality Date  . APPENDECTOMY  2014  . CARDIAC CATHETERIZATION N/A 05/30/2015   Procedure: Left Heart Cath and Coronary Angiography;  Surgeon: [name], MD;  Location: [place] [facility];  Service: Cardiovascular;  Laterality: N/A;  . CARDIAC CATHETERIZATION N/A 05/30/2015   Procedure: Coronary Balloon Angioplasty;  Surgeon: [name], MD;  Location: [place] [facility];  Service: Cardiovascular;  Laterality: N/A;  . Cardiolite   05/06/2011   low risk study; normal EF.  [facility]  . carotid dopplers  03/08/2011   minimal plaque formation B. Symptoms: dizziness.  . COLONOSCOPY  10/05/2008   single polyp, [facility].  [name].  Repeat in 3 years.  . CORONARY ANGIOPLASTY     "I've had 4" (04/27/2013)  . CORONARY ANGIOPLASTY WITH STENT PLACEMENT  04/2013; 04/27/2013   "2 + 1" (04/27/2013)  . EYE SURGERY  11/04/2013   Cataract surgery B; [place].  Marland Kitchen  LEFT HEART CATH AND CORONARY ANGIOGRAPHY N/A 08/05/2016   Procedure: Left Heart Cath and Coronary Angiography;  Surgeon: Martinique, Peter M, MD;  Location: [place] [facility];  Service: Cardiovascular;  Laterality: N/A;  . MELANOMA EXCISION Left ~ 2007   "arm"  . MELANOMA EXCISION  02/07/2015   R upper back. [facility]  . NEPHRECTOMY Left 2006  . PERCUTANEOUS CORONARY ROTOBLATOR INTERVENTION (PCI-R) N/A 04/06/2013   Procedure: PERCUTANEOUS CORONARY ROTOBLATOR INTERVENTION (PCI-R);  Surgeon: [name], MD;  Location: [facility] CATH LAB;  Service: Cardiovascular;  Laterality: N/A;  . PERCUTANEOUS CORONARY STENT INTERVENTION (PCI-S) N/A 04/27/2013   Procedure: PERCUTANEOUS CORONARY STENT INTERVENTION (PCI-S);  Surgeon: [name], MD;  Location: [facility] CATH LAB;  Service: Cardiovascular;  Laterality: N/A;    Allergies  Allergen Reactions  . Angiotensin Receptor Blockers     Other reaction(s): Kidney Disorder Hyperkalemia  . Lovastatin Other (See Comments)    Elevated liver enzymes  . Morphine Hives  . Nifedipine Other (See Comments)    Elevated liver enzymes    Current Outpatient Prescriptions  Medication Sig Dispense Refill  . acetaminophen (TYLENOL) 500 MG tablet Take 500 mg by mouth daily as needed for moderate pain.    Marland Kitchen amLODipine (NORVASC) 10 MG tablet Take 1 tablet (10 mg total) by mouth daily. (Patient taking differently: Take 10 mg by mouth every evening. ) 90 tablet 3  . aspirin 81 MG tablet Take 81 mg by mouth every evening.     Marland Kitchen atenolol (TENORMIN) 25 MG tablet Take 0.5 tablets (12.5 mg  total) by mouth at bedtime. 90 tablet 3  . Carboxymethylcell-Hypromellose (GENTEAL OP) Apply 1 drop to eye 3 (three) times daily as needed (dry eyes).    . clopidogrel (PLAVIX) 75 MG tablet Take 1 tablet (75 mg total) by mouth daily. 90 tablet 3  . ezetimibe (ZETIA) 10 MG tablet Take 1 tablet (10 mg total) by mouth daily. 90 tablet 3  . finasteride (PROSCAR) 5 MG tablet Take 1 tablet (5 mg total) by mouth daily. (Patient taking differently: Take 2.5 mg by mouth daily. ) 90 tablet 3  . Icosapent Ethyl 1 g CAPS Take 1 g by mouth 2 (two) times daily. 180 capsule 1  . isosorbide mononitrate (IMDUR) 60 MG 24 hr tablet Take 0.5 tablets (30 mg total) by mouth daily. 90 tablet 3  . nitroGLYCERIN (NITROSTAT) 0.4 MG SL tablet Place 1 tablet (0.4 mg total) under the tongue every 5 (five) minutes as needed for chest pain. 25 tablet 2  . Pitavastatin Calcium (LIVALO) 4 MG TABS Take 1 tablet (4 mg total) by mouth daily. (Patient taking differently: Take 1 tablet by mouth at bedtime. ) 90 tablet 3   No current facility-administered medications for this visit.     Socially he remains active. He is married has 5 children 10 grandchildren 2 great-grandchildren. Is no alcohol use. He typically scores below 75 and plays golf 3 days per week.  He typically scores in the 70s, better than his age.  ROS General: Negative; No fevers, chills, or night sweats;  Improved energy HEENT: Negative; No changes in vision or hearing, sinus congestion, difficulty swallowing Pulmonary: Positive for shortness of breath Cardiovascular: See history of present illness;  GI: Negative; No nausea, vomiting, diarrhea, or abdominal pain GU: Negative; No dysuria, hematuria, or difficulty voiding Musculoskeletal: Negative; no myalgias, joint pain, or weakness Hematologic/Oncology: Negative; no easy bruising, bleeding Endocrine: Negative; no heat/cold intolerance; no diabetes Neuro: Negative; no changes in  balance, headaches Skin:  Negative; No rashes or skin lesions Psychiatric: Negative; No behavioral problems, depression Sleep: Negative; No snoring, daytime sleepiness, hypersomnolence, bruxism, restless legs, hypnogognic hallucinations, no cataplexy Other comprehensive 14 point system review is negative.   PE BP (!) 152/84   Pulse (!) 57   Ht 5' 7.5" (1.715 m)   Wt 192 lb (87.1 kg)   BMI 29.63 kg/m    Repeat blood pressure by me 132/80  Wt Readings from Last 3 Encounters:  11/21/16 192 lb (87.1 kg)  09/05/16 197 lb (89.4 kg)  08/05/16 196 lb (88.9 kg)   General: Alert, oriented, no distress.  Skin: normal turgor, no rashes, warm and dry HEENT: Normocephalic, atraumatic. Pupils equal round and reactive to light; sclera anicteric; extraocular muscles intact; a new facial beard since his last office visit Nose without nasal septal hypertrophy Mouth/Parynx benign; Mallinpatti scale 3 Neck: No JVD, no carotid bruits; normal carotid upstroke Lungs: clear to ausculatation and percussion; no wheezing or rales Chest wall: without tenderness to palpitation Heart: PMI not displaced, RRR, s1 s2 normal, 1/6 systolic murmur, no diastolic murmur, no rubs, gallops, thrills, or heaves Abdomen: soft, nontender; no hepatosplenomehaly, BS+; abdominal aorta nontender and not dilated by palpation. Back: no CVA tenderness Pulses 2+ Musculoskeletal: full range of motion, normal strength, no joint deformities Extremities: no clubbing cyanosis or edema, Homan's sign negative  Neurologic: grossly nonfocal; Cranial nerves grossly wnl Psychologic: Normal mood and affect   ECG (independently read by me): Sinus bradycardia 57 bpm.  No ST segment changes.  Normal intervals.  August 2018 ECG (independently read by me): Sinus bradycardia 55 bpm.  Normal intervals.  No significant ST-T changes  June 2018 ECG (independently read by me): Sinus bradycardia 58 bpm.  Normal intervals.  No significant ST-T changes.  April 2018 ECG  (independently read by me): Normal sinus rhythm at 63 bpm.  Normal intervals.  No ST segment changes.  September 2017 ECG (independently read by me): Normal sinus rhythm at 60 bpm.  Nonspecific T changes.  Intervals are normal.  May 2017 ECG (independently read by me): Sinus bradycardia 55 bpm.  No ectopy.  Normal intervals.  Nondiagnostic T changes in lead 3.  April 2017 ECG (independently read by me): Sinus bradycardia 55 bpm.  No ectopy.  No significant ST changes.  Normal intervals.  01/12/2015 ECG (independently read by me): Sinus bradycardia 54 bpm.  No ectopy.  Normal intervals.  June 2016 ECG (independently read by me): Sinus bradycardia 58 bpm.  Normal intervals.  No ectopy. Non-specific T change aVL  November 2015 ECG (independently read by me): Sinus bradycardia 54 bpm.  No significant ST-T changes.  Normal intervals.  May 2015 ECG (independently read by me): Sinus bradycardia 54 beats per minute.  No ectopy.  QTc interval 396 ms.  No significant ST changes.  04/22/2013 ECG (independently read by me): Sinus bradycardia 55 beats per minute. Nonspecific ST changes  03/01/2013 ECG (independently read by me): Sinus bradycardia 52 beats per minute. Normal intervals. No significant ST changes.  Prior ECG of 11/20/2012: Sinus rhythm at 51 beats per minute. QTc interval 400 ms. No significant ST changes.  LABS:  BMP Latest Ref Rng & Units 09/05/2016 08/29/2016 08/26/2016  Glucose 65 - 99 mg/dL 81 108(H) 117(H)  BUN 8 - 27 mg/dL _0 Creatinine 0.76 - 1.27 mg/dL 1.27 1.30(H) 1.30(H)  BUN/Creat Ratio 10 - _1 Sodium 134 - 144 mmol/L 141 140 142  Potassium 3.5 - 5.2 mmol/L 4.7 5.4(H) 5.7(H)  Chloride 96 - 106 mmol/L 102 101 102  CO2 20 - 29 mmol/L _0 Calcium 8.6 - 10.2 mg/dL 9.7 9.7 9.4   Hepatic Function Latest Ref Rng & Units 09/05/2016 08/26/2016 04/29/2016  Total Protein 6.0 - 8.5 g/dL 7.2 7.5 7.1  Albumin 3.5 - 4.7 g/dL 4.6 4.8(H) 4.4  AST 0 - 40 IU/L 38 48(H)  34  ALT 0 - 44 IU/L 36 44 37  Alk Phosphatase 39 - 117 IU/L 59 55 58  Total Bilirubin 0.0 - 1.2 mg/dL 0.9 0.7 0.8  Bilirubin, Direct 0.00 - 0.40 mg/dL - - -   CBC Latest Ref Rng & Units 08/26/2016 08/01/2016 04/29/2016  WBC 3.4 - 10.8 x10E3/uL 6.2 7.5 6.6  Hemoglobin 13.0 - 17.7 g/dL 15.8 16.2 16.3  Hematocrit 37.5 - 51.0 % 48.1 47.9 48.1  Platelets 150 - 379 x10E3/uL 234 259 225   Lab Results  Component Value Date   MCV 91 08/26/2016   MCV 87 08/01/2016   MCV 88 04/29/2016   Lab Results  Component Value Date   TSH 3.820 04/29/2016  .  Lipid Panel     Component Value Date/Time   CHOL 144 08/26/2016 0816   TRIG 74 08/26/2016 0816   TRIG 131 02/22/2013 0804   HDL 52 08/26/2016 0816   HDL 40 02/22/2013 0804   CHOLHDL 2.8 08/26/2016 0816   CHOLHDL 3.5 05/09/2015 1400   VLDL 31 (H) 05/09/2015 1400   LDLCALC 77 08/26/2016 0816   LDLCALC 50 02/22/2013 0804   RADIOLOGY: Dg Chest 2 View  08/17/2012   *RADIOLOGY REPORT*  Clinical Data: Renal cell carcinoma  CHEST - 2 VIEW  Comparison: 05/08/11  Findings: The cardiomediastinal silhouette is stable.  No acute infiltrate or pleural effusion.  No pulmonary edema.  Bony thorax is unremarkable.  IMPRESSION: No active disease.  No significant change.   Original Report Authenticated By: Lahoma Crocker, M.D.    IMPRESSION:  1. CAD in native artery   2. Hyperlipidemia with target LDL less than 70   3. Essential hypertension   4. Stage III chronic kidney disease ([place])   5. Medication management   6. Fatigue, unspecified type     ASSESSMENT AND PLAN: Nicholas Douglas is a young appearing 83 year old gentleman who has CAD dating back to 19 and had undergone multiple interventions in over a 10 year period from 29 to 1997. He developed unstable angina symptomatology in March 2015 and catheterization done initially at [facility] showed multivessel disease.  He underwent successful staged rotational coronary atherectomy initially involving the RCA  in early March, and then on 04/27/2013 repeat intervention was done to the left circumflex system with rotational atherectomy.  At that time, he had a widely patent stent of his RCA, as well as a widely patent stent in his LAD.  He also had 60-70% mid LAD stenosis, which had not progressed. In 2017 he noticed a change in symptomatology with the development of more shortness of breath, and no energy and felt occasional episodes of vague chest discomfort with possible left arm radiation.  He was hospitalized in late April 2017 and was found to have progressive 99% distal RCA stenosis after the PDA takeoff.  His stents in the LAD and circumflex were widely patent.  There was mild concomitant CAD in the circumflex.  His RCA was diffusely diseased and calcified with narrowings of 30% of the mid segment, but there was an  angulated area of narrowing calcified narrowing of 40% before the acute margin, which prevented a stent from getting beyond this.  He underwent successful PTCA  to the distal stenosis.  When I was on vacation in early July, he develop recurrent symptoms which led to repeat cardiac catheterization which was done by Dr. Martinique on 08/05/2016.  His intervention sites were patent patent and there was no significant CAD progression.  There was mild 30-35% narrowing in the circumflex and mild 40% narrowing in the RCA.  The stents were patent as was the prior PTCA site.  Over the past 2 months he has been on the reduced medical regimen.  His blood pressure today on repeat by me was stable at 132/80 on isosorbide 30 mg, amlodipine 10 mg, and atenolol 12.5 mg at bedtime.  His fatigability has significantly improved.  He still has mild sinus bradycardia, heart rate is 57 and has normal intervals.  He continues to be on Livalo 4 mg for hyperlipidemia.  In addition to Zetia 10 mg with his last lipid studies demonstrating LDL cholesterol at 77.  He is no longer having a diet high in pinto beans which maybe contributed  to his hyperkalemia and is no longer on ARB therapy.  He continues to be on aspirin and Plavix for antiplatelet therapy.  He continues to be active.  I will see him in 6 months for reevaluation, and prior to that office visit complete set of laboratory will be obtained.  Time spent: 25 minutes  [name], MD, [facility]  11/21/2016 8:48 AM

## 2016-11-26 ENCOUNTER — Other Ambulatory Visit: Payer: Self-pay | Admitting: Cardiology

## 2016-11-26 DIAGNOSIS — I25810 Atherosclerosis of coronary artery bypass graft(s) without angina pectoris: Secondary | ICD-10-CM

## 2016-11-26 NOTE — Telephone Encounter (Signed)
REFILL 

## 2017-02-07 IMAGING — US US AORTA
1 series · 14 of 21 positions shown · non-contrast
Comparison: 08/26/2012 CT.

CLINICAL DATA: 84-year-old hypertensive male.  Initial encounter.

EXAM:
ULTRASOUND OF ABDOMINAL AORTA
TECHNIQUE: Ultrasound examination of the abdominal aorta was performed to
evaluate for abdominal aortic aneurysm.

[Series 1: us aorta · 0.31mm/px · 14 of 21 slices shown]
[im 1/21]
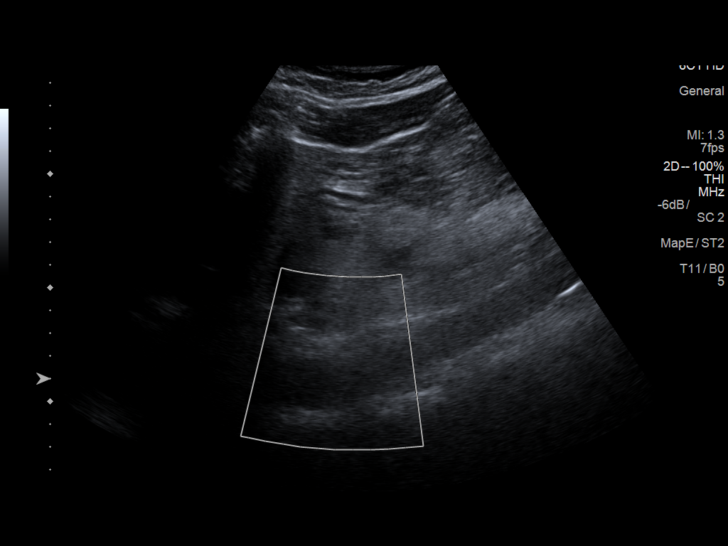
[im 3/21]
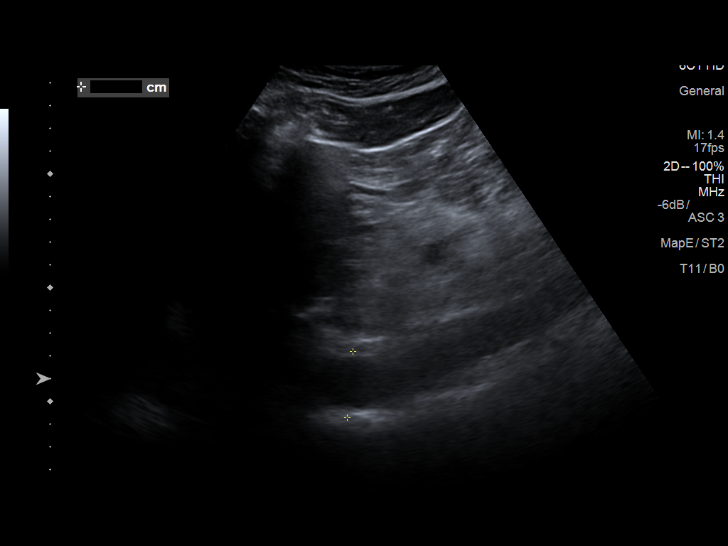
[im 4/21]
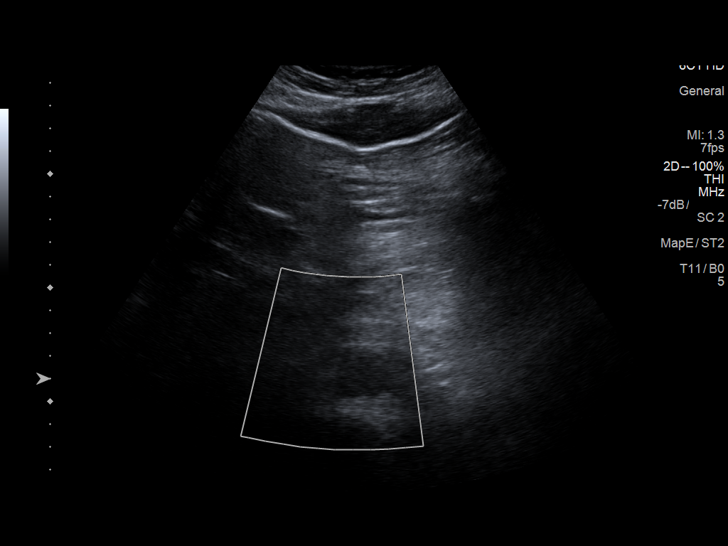
[im 6/21]
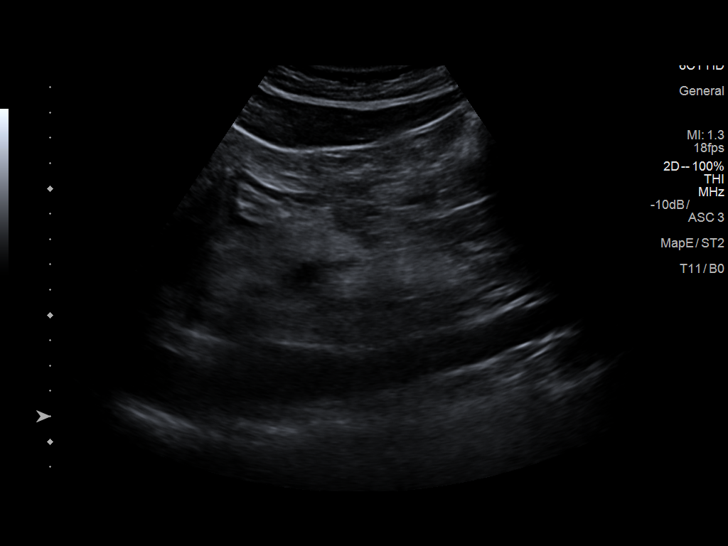
[im 7/21]
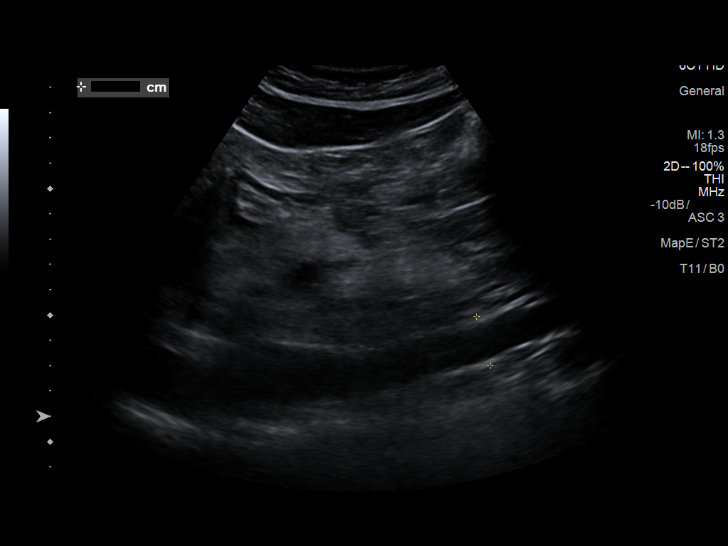
[im 9/21]
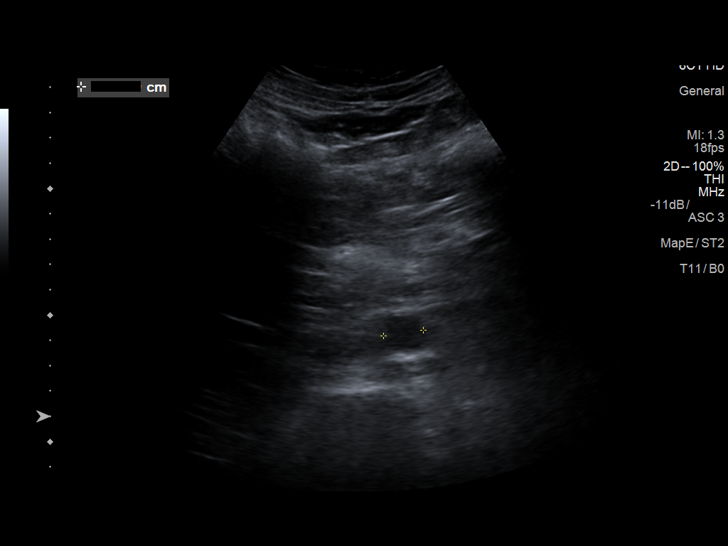
[im 10/21]
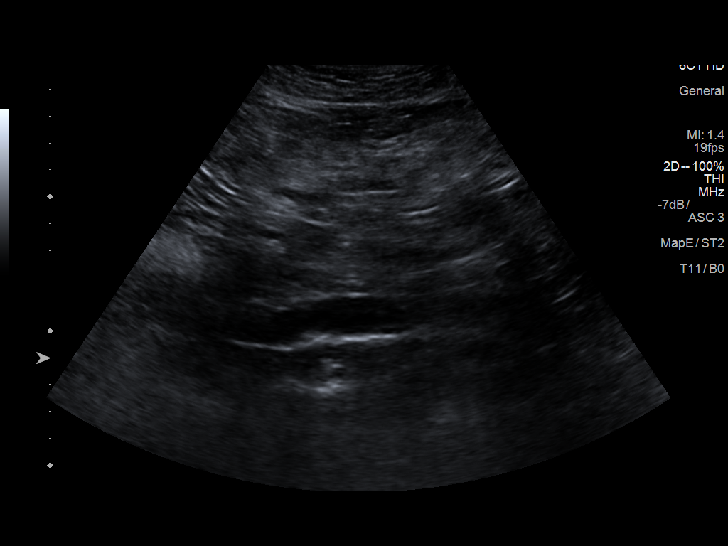
[im 12/21]
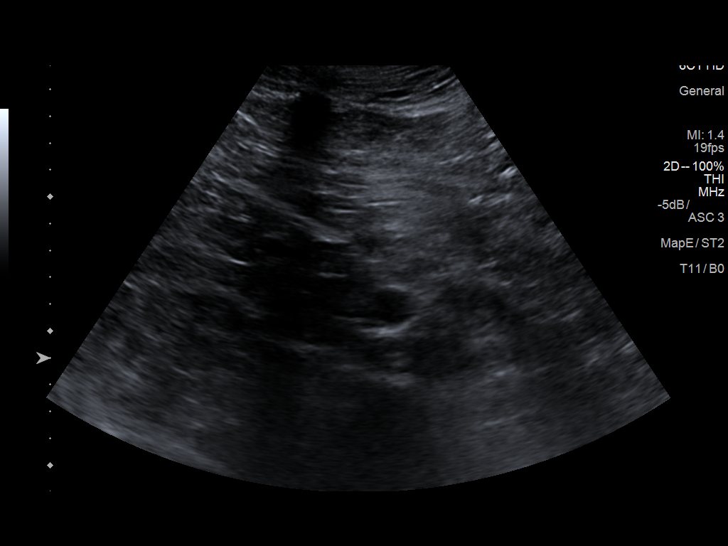
[im 13/21]
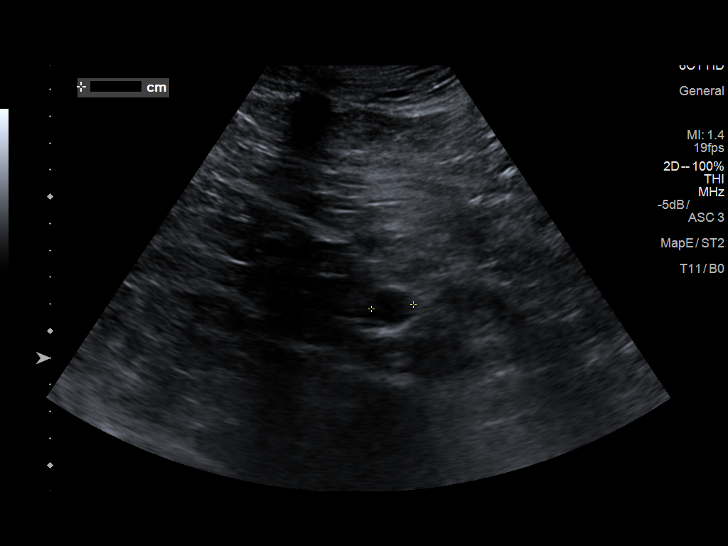
[im 15/21]
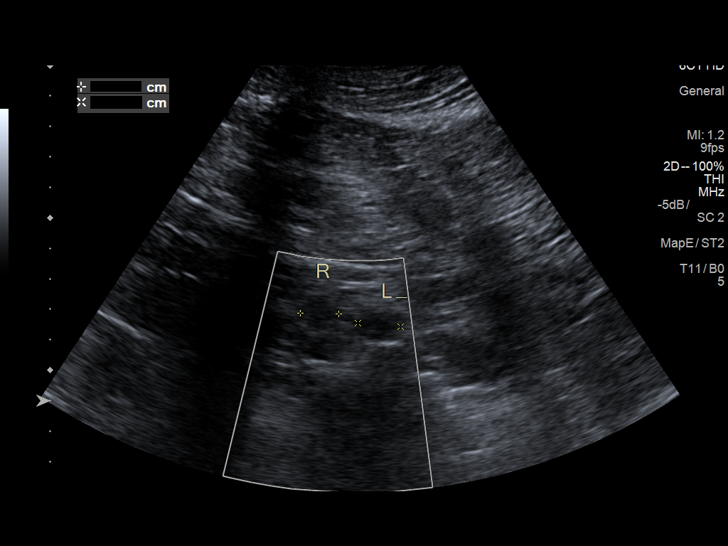
[im 16/21]
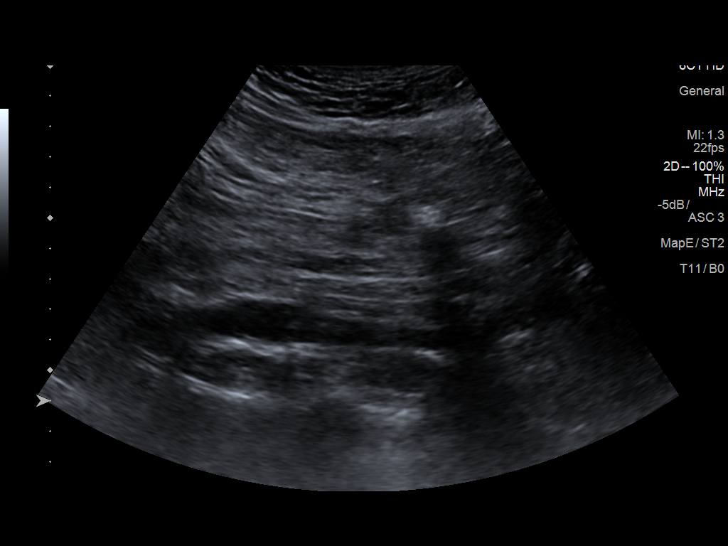
[im 18/21]
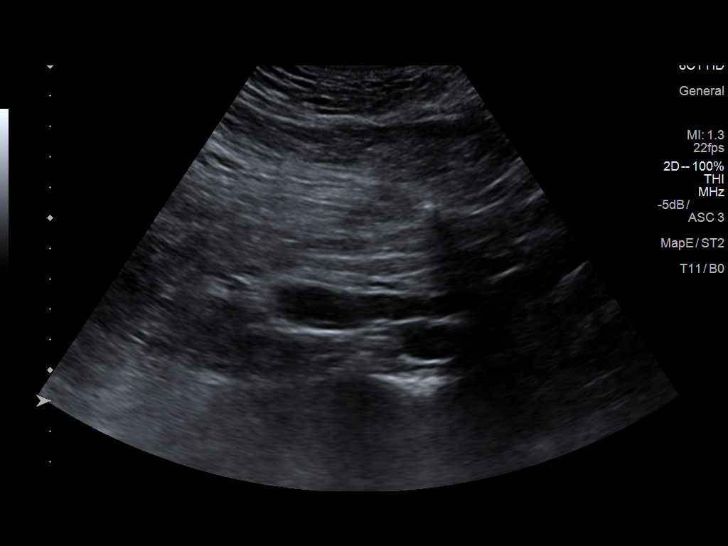
[im 19/21]
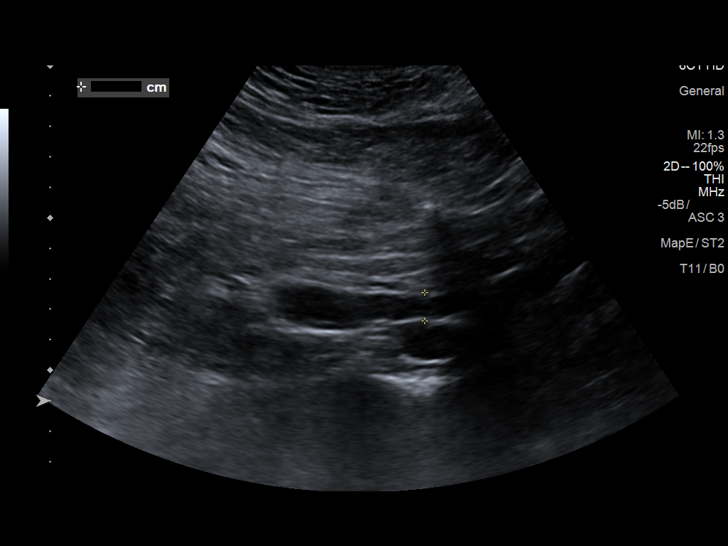
[im 21/21]
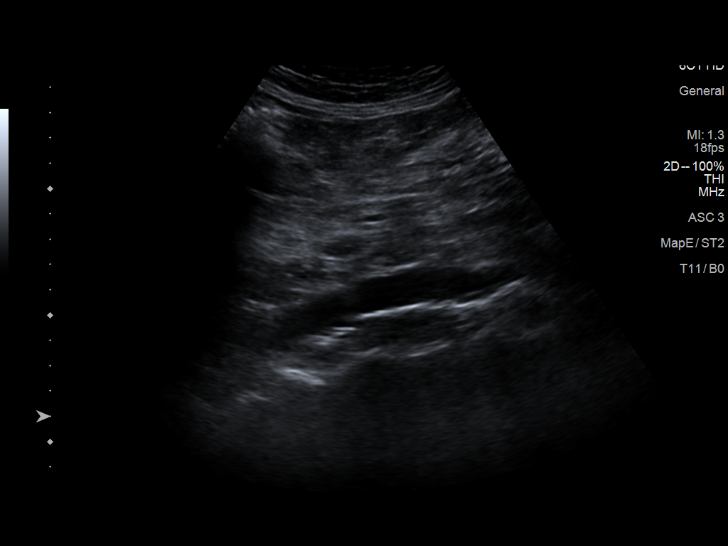

[14 of 21 positions shown; findings below may reference images not displayed]

FINDINGS: Abdominal Aorta

Slightly ectatic abdominal aorta with atherosclerotic changes.
Maximal transverse dimension is of the proximal abdominal aorta
measuring 2.9 x 2.8 cm.
IMPRESSION: No abdominal aorta aneurysm noted. Atherosclerotic changes with
slight ectasia as noted above.

## 2017-02-17 ENCOUNTER — Other Ambulatory Visit: Payer: Self-pay | Admitting: Internal Medicine

## 2017-02-17 DIAGNOSIS — M5116 Intervertebral disc disorders with radiculopathy, lumbar region: Secondary | ICD-10-CM

## 2017-02-21 ENCOUNTER — Ambulatory Visit
Admission: RE | Admit: 2017-02-21 | Discharge: 2017-02-21 | Disposition: A | Discharge status: Home / Self Care | Payer: Medicare Other | Source: Ambulatory Visit | Attending: Internal Medicine | Admitting: Internal Medicine

## 2017-02-21 DIAGNOSIS — M5136 Other intervertebral disc degeneration, lumbar region: Secondary | ICD-10-CM | POA: Diagnosis not present

## 2017-02-21 DIAGNOSIS — M5116 Intervertebral disc disorders with radiculopathy, lumbar region: Secondary | ICD-10-CM | POA: Diagnosis present

## 2017-02-21 DIAGNOSIS — D759 Disease of blood and blood-forming organs, unspecified: Secondary | ICD-10-CM | POA: Diagnosis not present

## 2017-02-21 DIAGNOSIS — M48061 Spinal stenosis, lumbar region without neurogenic claudication: Secondary | ICD-10-CM | POA: Diagnosis not present

## 2017-02-21 DIAGNOSIS — M4316 Spondylolisthesis, lumbar region: Secondary | ICD-10-CM | POA: Insufficient documentation

## 2017-02-21 DIAGNOSIS — Z905 Acquired absence of kidney: Secondary | ICD-10-CM | POA: Diagnosis not present

## 2017-02-24 ENCOUNTER — Telehealth: Payer: Self-pay

## 2017-02-24 NOTE — Telephone Encounter (Signed)
   [facility] Pre-operative Risk Assessment    Request for surgical clearance:  1. What type of surgery is being performed? Epidural Steroid Injection  2. When is this surgery scheduled? tbd   What type of clearance is required (medical clearance vs. Pharmacy clearance to hold med vs. Both)?  both 3. Are there any medications that need to be held prior to surgery and how long? Plavix   4. Practice name and name of physician performing surgery? Dr B. Chasnis at [facility]  5. What is your office phone and fax number? Ph. [phone] fax: [phone]   6. Anesthesia type (None, local, MAC, general)? unsure   Nicholas Douglas T 02/24/2017, 10:05 AM  _________________________________________________________________   (provider comments below)

## 2017-02-24 NOTE — Telephone Encounter (Signed)
   Primary Cardiologist: [name], MD  Chart reviewed as part of pre-operative protocol coverage. Patient was contacted 02/24/2017 in reference to pre-operative risk assessment for pending surgery as outlined below.  Nicholas Douglas was last seen on 11/21/16 by Dr. [name].  Since that day, Nicholas Douglas has done well from cardiac standpoint. He is plagued by back issues but has remained active without any recent anginal symptoms - can go up steps, walk, mow lawn, and play golf without new chest pain or dyspnea. He has h/o CAD s/p prior PCIs, HLD, aortoiliac PVD, HTN, tobacco. Last PCI was 05/2015, but last cath was 08/2016 with patent stents. EF 55-65%. Revised cardiac risk index calculated at 0.9%. Therefore, based on ACC/AHA guidelines, the patient would be at acceptable risk for the planned procedure without further cardiovascular testing.   I will route to Dr. [name] regarding how long he feels patient is OK to hold Plavix in prep for epidural steroid injection. Dr. [name], please route your reply to [facility] PRE[facility].  Charlie Pitter, PA-C 02/24/2017, 3:40 PM

## 2017-02-26 NOTE — Telephone Encounter (Signed)
Would hold plavix for at least 7 days with epidural injection

## 2017-02-27 NOTE — Telephone Encounter (Signed)
Clearance routed to requesting office via Epic.

## 2017-03-10 ENCOUNTER — Telehealth: Payer: Self-pay | Admitting: Cardiovascular Disease

## 2017-03-10 NOTE — Telephone Encounter (Signed)
Spoke with Mariann Laster patients wife and according to last office visit Dr [name] wanted labs prior to follow up visit. She stated she received lab orders and will use them.

## 2017-03-10 NOTE — Telephone Encounter (Signed)
Patient wife calling, would like to verify that patient needs labwork at f/u appt

## 2017-03-26 ENCOUNTER — Telehealth: Payer: Self-pay | Admitting: Cardiovascular Disease

## 2017-03-26 ENCOUNTER — Other Ambulatory Visit: Payer: Self-pay | Admitting: Cardiovascular Disease

## 2017-03-26 NOTE — Telephone Encounter (Signed)
close

## 2017-03-27 NOTE — Telephone Encounter (Signed)
Rx has been sent to the pharmacy electronically. ° °

## 2017-04-18 ENCOUNTER — Other Ambulatory Visit: Payer: Self-pay | Admitting: Cardiovascular Disease

## 2017-04-18 NOTE — Telephone Encounter (Signed)
REFILL 

## 2017-05-14 ENCOUNTER — Other Ambulatory Visit: Payer: Self-pay | Admitting: Cardiovascular Disease

## 2017-05-16 LAB — COMPREHENSIVE METABOLIC PANEL
A/G RATIO: 1.7 (ref 1.2–2.2)
ALBUMIN: 4.5 g/dL (ref 3.5–4.7)
ALT: 34 IU/L (ref 0–44)
AST: 36 IU/L (ref 0–40)
Alkaline Phosphatase: 58 IU/L (ref 39–117)
BUN/Creatinine Ratio: 13 (ref 10–24)
BUN: 15 mg/dL (ref 8–27)
Bilirubin Total: 0.9 mg/dL (ref 0.0–1.2)
CALCIUM: 9.5 mg/dL (ref 8.6–10.2)
CO2: 23 mmol/L (ref 20–29)
CREATININE: 1.15 mg/dL (ref 0.76–1.27)
Chloride: 103 mmol/L (ref 96–106)
GFR, EST AFRICAN AMERICAN: 67 mL/min/{1.73_m2} (ref >59)
GFR, EST NON AFRICAN AMERICAN: 58 mL/min/{1.73_m2} — AB (ref >59)
GLOBULIN, TOTAL: 2.6 g/dL (ref 1.5–4.5)
Glucose: 102 mg/dL — ABNORMAL HIGH (ref 65–99)
POTASSIUM: 4.7 mmol/L (ref 3.5–5.2)
SODIUM: 143 mmol/L (ref 134–144)
TOTAL PROTEIN: 7.1 g/dL (ref 6.0–8.5)

## 2017-05-16 LAB — CBC
HEMOGLOBIN: 16.3 g/dL (ref 13.0–17.7)
Hematocrit: 48 % (ref 37.5–51.0)
MCH: 29.9 pg (ref 26.6–33.0)
MCHC: 34 g/dL (ref 31.5–35.7)
MCV: 88 fL (ref 79–97)
PLATELETS: 242 10*3/uL (ref 150–379)
RBC: 5.46 x10E6/uL (ref 4.14–5.80)
RDW: 13.4 % (ref 12.3–15.4)
WBC: 5.7 10*3/uL (ref 3.4–10.8)

## 2017-05-16 LAB — TSH: TSH: 2.95 u[IU]/mL (ref 0.450–4.500)

## 2017-05-16 LAB — LIPID PANEL
CHOLESTEROL TOTAL: 142 mg/dL (ref 100–199)
Chol/HDL Ratio: 2.8 ratio (ref 0.0–5.0)
HDL: 51 mg/dL (ref >39)
LDL CALC: 66 mg/dL (ref 0–99)
Triglycerides: 123 mg/dL (ref 0–149)
VLDL CHOLESTEROL CAL: 25 mg/dL (ref 5–40)

## 2017-05-22 ENCOUNTER — Ambulatory Visit (INDEPENDENT_AMBULATORY_CARE_PROVIDER_SITE_OTHER): Payer: Medicare Other | Admitting: Cardiovascular Disease

## 2017-05-22 ENCOUNTER — Encounter: Payer: Self-pay | Admitting: Cardiovascular Disease

## 2017-05-22 VITALS — BP 148/78 | HR 61 | Ht 67.5 in | Wt 197.0 lb

## 2017-05-22 DIAGNOSIS — N183 Chronic kidney disease, stage 3 unspecified: Secondary | ICD-10-CM

## 2017-05-22 DIAGNOSIS — Z79899 Other long term (current) drug therapy: Secondary | ICD-10-CM | POA: Diagnosis not present

## 2017-05-22 DIAGNOSIS — I2510 Atherosclerotic heart disease of native coronary artery without angina pectoris: Secondary | ICD-10-CM | POA: Diagnosis not present

## 2017-05-22 DIAGNOSIS — R0602 Shortness of breath: Secondary | ICD-10-CM

## 2017-05-22 DIAGNOSIS — E785 Hyperlipidemia, unspecified: Secondary | ICD-10-CM | POA: Diagnosis not present

## 2017-05-22 DIAGNOSIS — I10 Essential (primary) hypertension: Secondary | ICD-10-CM

## 2017-05-22 MED ORDER — HYDROCHLOROTHIAZIDE 12.5 MG PO CAPS: 12.5000 mg | ORAL_CAPSULE | ORAL | 3 refills | Status: DC

## 2017-05-22 NOTE — Progress Notes (Signed)
Patient ID: Nicholas Douglas, male   DOB: Apr 03, 1931, 84 y.o.   MRN: [id number]     HPI: Nicholas Douglas is an 84 y.o. Caucasian male who presents to the office today for a 6 andk in [place] month evaluation.  Nicholas Douglas has known CAD and underwent numerous interventions dating back to 6, 1991, 1993, and in 1997. Cardiac catheterization in 2011 which showed preserved LV function with mild residual distal inferior apical hypocontractility. There is evidence for coronary calcification with segmental narrowing of his LAD of 30-40% proximally, 50% diffusely, in the midsegment, and 70-80% in the distal region, he had AV groove circumflex stenoses of 70 and 50% with a 40% OM 2 stenosis, and a 30-40 and 50% RCA stenoses with 80-90% stenosis in the acute marginal branch. He had been on medical therapy.  He  presented to [facility] in March 2015 with class IV angina and catheterization demonstrated severe 2 vessel CAD with significant current calcification of the LAD with up to 95% mid LAD stenosis and diffuse proximal stenosis. He underwent successful high-speed rotational atherectomy the following day by Dr. Martinique and a 1.5 mm bur was used. Mid LAD was stented with a 2.75x38 mm Promus stent in the proximal LAD was stented with a 3.0x28 mm Promus stent. Medical therapy was recommended for his distal LAD stenosis. He has only one kidney and staged intervention to the left circumflex coronary artery was recommended.  He underwent staged left circumflex PCI on 04/27/2013 by me with successful high-speed rotational atherectomy with a 1.5 and 1.75 mm burr, and ultimately had a 2.5x28 mm Promus premier DES stent inserted into the circumflex vessel, which was post dilated to approximately 2.6 mm.  Subsequently he has felt significantly improved with resolution of any chest pain  He  has a history of significant hyperlipidemia and has had significant increased number of small LDL particles and insulin resistance. This  seemed to markedly improve with the addition of Niaspan added to zetia and lovaza.  Remotely, he had been on statins consisting of Lipitor and subsequently Crestor. He did have transient LFT elevation. He also concerns of possible risk of developing dementia with statin therapy.  He had  some episodes of Niaspan induced diffuse flushing. He wanted to stop taking his Niaspan. He did have subsequent NMR off Niaspan and done just on Zetia and as well as 2 g of Lovaza.  This showed increased abnormalities such that his total cholesterol was 201, LDL had risen to 124, but he now had LDL small particles which have increased from 685 to 1348. Laboratory on 10/26/2012: LDL particle number was elevated at 1657, LDL cholesterol 112 triglycerides 155 and total cholesterol 190. He continued to be insulin resistant with insulin resistance scored 65. When I last saw him, we elected to try Livalo and ultimately titrated this to 4 mg daily to take in addition to his Zetia 10 mg. Laboratory on 02/22/2013: LDL particle number was  markedly improved at 774 with an HDL of 40 calculated LDL 50 small LDL particle #417. Insulin resistance score was also improved at 51.  In July 2015 he developed abdominal discomfort and was found to have acute appendicitis. On CT imaging he was also noted to have prostate enlargement with nodularity and thickening of the bladder base and cystoscopy was suggested for further evaluation. He is status post left nephrectomy. He also was noted to have a 2.3 cm infrarenal suprailiac abdominal aortic aneurysm.  He has been on Livalo  2 mg, Zetia 10 mg for hyperlipidemia.  Lab work on 12/22/2013 showed a cholesterol 155, triglycerides 130, HDL 45, LDL 84.  He has been on amlodipine 10 mg atenolol 12.5 mg twice a day and valsartan 80 mg daily for blood pressure.  He continues to be on aspirin and Plavix for dual antiplatelet therapy.  A nuclear perfusion study on 06/30/2014  revealed normal perfusion and  function with an ejection fraction of 57%.    When he underwent a CT scan for his appendicitis he was told of having a small abdominal aortic aneurysm.  His mother also had an abdominal aortic aneurysm.  The patient recently sprained his right ankle.  As result, he has not been able to be as active as he had in the past and has not been able to play golf which typically he had played up to 4 times per week, often scoring in the 70s.    He was recently admitted to the hospital in April 2017 with complaints of increasing episodes of chest discomfort.  Troponins were mildly positive at 0.12, consistent with a non-STEMI.  I performed cardiac catheterization on 05/28/2015.  This showed widely patent stents in the LAD and circumflex vessels.  The RCA was diffusely diseased with calcification in the proximal to mid region with distal 99% stenosis and a noncalcified distal RCA segment immediately after the PDA takeoff.  ECI was difficult due to the calcified RCA preventing placement of a stent since the stent was never able to be passed beyond this calcified angled segment despite even attempting guide liner support.  Ultimately, he underwent successful PTCA of the 99% stenosis, which was reduced to 0%.  Since his intervention he had noticed dramatic improvement in his previous symptomatology.  He continues to feel well and is playing golf 3 times per week. He had a basal cell cancer removed from his nose.   When I saw him in April 2018 he was without chest pain or significant shortness of breath and was playing golf at least 3 days per week.  Laboratory showed a total cholesterol 147, triglycerides 151, HDL 43, LDL 74 on regimen of Zetia 10 mg and Livalo 2 mg.  In the past he's been intolerant to Crestor, Lipitor, and Zocor.  I further titrated Livalo 24 mg daily which  he has tolerated. An abdominal ultrasound showed aortoiliac atherosclerosis without aneurysm.   When I saw him in June he had begun to notice  increasing shortness of breath with walking. He denies any of the chest tightness or pressure that he had experienced prior to his interventions.  At his last catheterization, he had extensive calcification in his RCA and PTCA alone was done since the stent was unable to reach the subtotal distal RCA.  He denies recurrent chest tightness.  He was questioning whether or not he could have had  pneumonia comtributing to his shortness of breath.  At that evaluation, I further titrated his Imdur to 60 mg daily.  His blood pressure was also elevated and I titrated his ARB therapy.  He sagittally saw Caro Hight as an add-on in with complaints of increasing symptomatology definitive cardiac catheterization was recommended.  He underwent repeat cardiac catheterization on 08/05/2016 by Dr. Peter Martinique.  This continues to show patency of the stents in the proximal to mid LAD and patency of the stented the mid circumflex as well as patent PTCA site of the PL OM vessel.  There was no new disease to explain his  symptoms.  He had normal LV function and normal left ventricular end-diastolic pressure.  Following the catheterization, he has felt well, but he continues to experience some shortness of breath with weakness and decreased energy.  He denies any chest tightness.  Recent laboratory done 10 days ago revealed an elevated potassium at 5.7.  On recheck 3 days later was 5.4.  Subsequent, he has been taken off ARB therapy.   In August 2018 he was complaining of significant fatigue.  His blood pressure was stable and his pulse was 55.  I recommended he reduce his atenolol from 12.5 mg twice a day to just 12.5 mg at bedtime.  I also reduced his isosorbide from 60 mg down to 30 mg in light of his recent catheterization findings.  His prior hypokalemia, resolved with discontinuance of ARB, but more importantly with discontinuance of his excessive exogenous potassium intake with certain foods.   When I last saw him in  October 2018 he was fairly stable. Over the winter months he has had difficulty with his back and required injections.  This has limited his exercise.  He admits to gaining weight.  He has noticed that he is more short of breath with walking up hills.  Blood pressures at home often been in the 140-150 range.  He denies any chest pressure.  He is unaware of palpitations.  He had repeat lab work one week ago.  Renal function was stable with a creatinine 1.15.  Potassium was 4.7.  LFTs were normal.  Hemoglobin and hematocrit were stable.  Lipid studies were excellent.  He presents for reevaluation.  Past Medical History:  Diagnosis Date  . BPH (benign prostatic hyperplasia)    Followed by [name]/urology  . CAD S/P PTCA only of RPAV-PL 05/31/2015   99% --> 0%PAV - PTCA only (unable to advance STENT) due to RCA calcification - prox & distal RCA 30%; Patent LAD stent & Cx stent (~20% ISR).   . Calculus of kidney    "that's how they found the cancer" (04/27/2013)  . Coronary artery disease involving native coronary artery of native heart with unstable angina pectoris ([place]) 12/03/2011   S/P Cardiac angioplasty 1986, 1991, 1993, 1997.  Last cardiac catheterization 2001.  Cardiolite 05/2011 low risk with normal EF 65%.  Followed by [facility]ology/[name] every six months.   . History of myocardial infarction 1976  . Hypertension   . Impotence of organic origin   . Internal hemorrhoids without mention of complication   . Malignant neoplasm of kidney, except pelvis   . Melanoma ([place]) 06/2010   Left arm  . 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  . Other abnormal blood chemistry   . Other nonspecific abnormal serum enzyme levels   . Personal history of colonic polyps   . Pure hypercholesterolemia   . Tobacco use disorder   . Unspecified congenital cystic kidney disease   . Unstable angina ([place]) 04/05/2013; 05/2015    Past Surgical History:  Procedure Laterality Date  .  APPENDECTOMY  2014  . CARDIAC CATHETERIZATION N/A 05/30/2015   Procedure: Left Heart Cath and Coronary Angiography;  Surgeon: [name], MD;  Location: [facility];  Service: Cardiovascular;  Laterality: N/A;  . CARDIAC CATHETERIZATION N/A 05/30/2015   Procedure: Coronary Balloon Angioplasty;  Surgeon: [name], MD;  Location: [facility];  Service: Cardiovascular;  Laterality: N/A;  . Cardiolite  05/06/2011   low risk study;  normal EF.  [facility]  . carotid dopplers  03/08/2011   minimal plaque formation B. Symptoms: dizziness.  . COLONOSCOPY  10/05/2008   single polyp, [facility].  [name].  Repeat in 3 years.  . CORONARY ANGIOPLASTY     "I've had 4" (04/27/2013)  . CORONARY ANGIOPLASTY WITH STENT PLACEMENT  04/2013; 04/27/2013   "2 + 1" (04/27/2013)  . EYE SURGERY  11/04/2013   Cataract surgery B; [facility].  Marland Kitchen LEFT HEART CATH AND CORONARY ANGIOGRAPHY N/A 08/05/2016   Procedure: Left Heart Cath and Coronary Angiography;  Surgeon: Martinique, Peter M, MD;  Location: [facility];  Service: Cardiovascular;  Laterality: N/A;  . MELANOMA EXCISION Left ~ 2007   "arm"  . MELANOMA EXCISION  02/07/2015   R upper back. [facility]  . NEPHRECTOMY Left 2006  . PERCUTANEOUS CORONARY ROTOBLATOR INTERVENTION (PCI-R) N/A 04/06/2013   Procedure: PERCUTANEOUS CORONARY ROTOBLATOR INTERVENTION (PCI-R);  Surgeon: [name], MD;  Location: [facility];  Service: Cardiovascular;  Laterality: N/A;  . PERCUTANEOUS CORONARY STENT INTERVENTION (PCI-S) N/A 04/27/2013   Procedure: PERCUTANEOUS CORONARY STENT INTERVENTION (PCI-S);  Surgeon: [name], MD;  Location: [facility] C[facility] LAB;  Service: Cardiovascular;  Laterality: N/A;    Allergies  Allergen Reactions  . Angiotensin Receptor Blockers     Other reaction(s): Kidney Disorder Hyperkalemia  . Lovastatin Other (See Comments)    Elevated liver enzymes  . Morphine Hives  . Nifedipine Other (See Comments)    Elevated liver enzymes    Current Outpatient  Medications  Medication Sig Dispense Refill  . acetaminophen (TYLENOL) 500 MG tablet Take 500 mg by mouth daily as needed for moderate pain.    Marland Kitchen amLODipine (NORVASC) 10 MG tablet Take 1 tablet (10 mg total) by mouth daily. (Patient taking differently: Take 10 mg by mouth every evening. ) 90 tablet 3  . aspirin 81 MG tablet Take 81 mg by mouth every evening.     Marland Kitchen atenolol (TENORMIN) 25 MG tablet Take 0.5 tablets (12.5 mg total) by mouth at bedtime. 90 tablet 3  . Carboxymethylcell-Hypromellose (GENTEAL OP) Apply 1 drop to eye 3 (three) times daily as needed (dry eyes).    . clopidogrel (PLAVIX) 75 MG tablet Take 1 tablet (75 mg total) by mouth daily. 90 tablet 3  . ezetimibe (ZETIA) 10 MG tablet Take 1 tablet (10 mg total) by mouth daily. 90 tablet 3  . finasteride (PROSCAR) 5 MG tablet Take 1 tablet (5 mg total) by mouth daily. (Patient taking differently: Take 2.5 mg by mouth daily. ) 90 tablet 3  . isosorbide mononitrate (IMDUR) 60 MG 24 hr tablet Take 0.5 tablets (30 mg total) by mouth daily. 90 tablet 3  . nitroGLYCERIN (NITROSTAT) 0.4 MG SL tablet PLACE 1 TABLET (0.4 MG TOTAL) UNDER THE TONGUE EVERY 5 (FIVE) MINUTES AS NEEDED FOR CHEST PAIN. 25 tablet 11  . Pitavastatin Calcium (LIVALO) 4 MG TABS Take 1 tablet (4 mg total) by mouth daily. KEEP OV. 90 tablet 1  . VASCEPA 1 g CAPS TAKE 1 CAPSULE TWICE A DAY 180 capsule 1  . hydrochlorothiazide (MICROZIDE) 12.5 MG capsule Take 1 capsule (12.5 mg total) by mouth every other day. 30 capsule 3   No current facility-administered medications for this visit.     Socially he remains active. He is married has 5 children 10 grandchildren 2 great-grandchildren. Is no alcohol use. He typically scores below 75 and plays golf 3 days per week.  He typically scores in the  28s, better than his age.  ROS General: Negative; No fevers, chills, or night sweats;  Improved energy HEENT: Negative; No changes in vision or hearing, sinus congestion, difficulty  swallowing Pulmonary: Positive for shortness of breath Cardiovascular: See history of present illness;  GI: Negative; No nausea, vomiting, diarrhea, or abdominal pain GU: Negative; No dysuria, hematuria, or difficulty voiding Musculoskeletal: Negative; no myalgias, joint pain, or weakness Hematologic/Oncology: Negative; no easy bruising, bleeding Endocrine: Negative; no heat/cold intolerance; no diabetes Neuro: Negative; no changes in balance, headaches Skin: Negative; No rashes or skin lesions Psychiatric: Negative; No behavioral problems, depression Sleep: Negative; No snoring, daytime sleepiness, hypersomnolence, bruxism, restless legs, hypnogognic hallucinations, no cataplexy Other comprehensive 14 point system review is negative.   PE BP (!) 148/78   Pulse 61   Ht 5' 7.5" (1.715 m)   Wt 197 lb (89.4 kg)   BMI 30.40 kg/m    Repeat blood pressure by me was 120 478 supine and 122/78 standing  Wt Readings from Last 3 Encounters:  05/22/17 197 lb (89.4 kg)  11/21/16 192 lb (87.1 kg)  09/05/16 197 lb (89.4 kg)   General: Alert, oriented, no distress.  Skin: normal turgor, no rashes, warm and dry HEENT: Normocephalic, atraumatic. Pupils equal round and reactive to light; sclera anicteric; extraocular muscles intact;  Nose without nasal septal hypertrophy Mouth/Parynx benign; Mallinpatti scale 3 Neck: No JVD, no carotid bruits; normal carotid upstroke Lungs: clear to ausculatation and percussion; no wheezing or rales Chest wall: without tenderness to palpitation Heart: PMI not displaced, RRR, s1 s2 normal, 1/6 systolic murmur, no diastolic murmur, no rubs, gallops, thrills, or heaves Abdomen: soft, nontender; no hepatosplenomehaly, BS+; abdominal aorta nontender and not dilated by palpation. Back: no CVA tenderness Pulses 2+ Musculoskeletal: full range of motion, normal strength, no joint deformities Extremities: no clubbing cyanosis or edema, Homan's sign negative    Neurologic: grossly nonfocal; Cranial nerves grossly wnl Psychologic: Normal mood and affect  ECG (independently read by me):normal sinus rhythm at 61 bpm.  No ectopy.  No ST segment changes.  October 2018 ECG (independently read by me): Sinus bradycardia 57 bpm.  No ST segment changes.  Normal intervals.  August 2018 ECG (independently read by me): Sinus bradycardia 55 bpm.  Normal intervals.  No significant ST-T changes  June 2018 ECG (independently read by me): Sinus bradycardia 58 bpm.  Normal intervals.  No significant ST-T changes.  April 2018 ECG (independently read by me): Normal sinus rhythm at 63 bpm.  Normal intervals.  No ST segment changes.  September 2017 ECG (independently read by me): Normal sinus rhythm at 60 bpm.  Nonspecific T changes.  Intervals are normal.  May 2017 ECG (independently read by me): Sinus bradycardia 55 bpm.  No ectopy.  Normal intervals.  Nondiagnostic T changes in lead 3.  April 2017 ECG (independently read by me): Sinus bradycardia 55 bpm.  No ectopy.  No significant ST changes.  Normal intervals.  01/12/2015 ECG (independently read by me): Sinus bradycardia 54 bpm.  No ectopy.  Normal intervals.  June 2016 ECG (independently read by me): Sinus bradycardia 58 bpm.  Normal intervals.  No ectopy. Non-specific T change aVL  November 2015 ECG (independently read by me): Sinus bradycardia 54 bpm.  No significant ST-T changes.  Normal intervals.  May 2015 ECG (independently read by me): Sinus bradycardia 54 beats per minute.  No ectopy.  QTc interval 396 ms.  No significant ST changes.  04/22/2013 ECG (independently read by me): Sinus bradycardia  55 beats per minute. Nonspecific ST changes  03/01/2013 ECG (independently read by me): Sinus bradycardia 52 beats per minute. Normal intervals. No significant ST changes.  Prior ECG of 11/20/2012: Sinus rhythm at 51 beats per minute. QTc interval 400 ms. No significant ST changes.  LABS:  BMP Latest Ref  Rng & Units 05/15/2017 09/05/2016 08/29/2016  Glucose 65 - 99 mg/dL 102(H) 81 108(H)  BUN 8 - 27 mg/dL '15 17 20  ' Creatinine 0.76 - 1.27 mg/dL 1.15 1.27 1.30(H)  BUN/Creat Ratio 10 - '24 13 13 15  ' Sodium 134 - 144 mmol/L 143 141 140  Potassium 3.5 - 5.2 mmol/L 4.7 4.7 5.4(H)  Chloride 96 - 106 mmol/L 103 102 101  CO2 20 - 29 mmol/L '23 23 23  ' Calcium 8.6 - 10.2 mg/dL 9.5 9.7 9.7   Hepatic Function Latest Ref Rng & Units 05/15/2017 09/05/2016 08/26/2016  Total Protein 6.0 - 8.5 g/dL 7.1 7.2 7.5  Albumin 3.5 - 4.7 g/dL 4.5 4.6 4.8(H)  AST 0 - 40 IU/L 36 38 48(H)  ALT 0 - 44 IU/L 34 36 44  Alk Phosphatase 39 - 117 IU/L 58 59 55  Total Bilirubin 0.0 - 1.2 mg/dL 0.9 0.9 0.7  Bilirubin, Direct 0.00 - 0.40 mg/dL - - -   CBC Latest Ref Rng & Units 05/15/2017 08/26/2016 08/01/2016  WBC 3.4 - 10.8 x10E3/uL 5.7 6.2 7.5  Hemoglobin 13.0 - 17.7 g/dL 16.3 15.8 16.2  Hematocrit 37.5 - 51.0 % 48.0 48.1 47.9  Platelets 150 - 379 x10E3/uL 242 234 259   Lab Results  Component Value Date   MCV 88 05/15/2017   MCV 91 08/26/2016   MCV 87 08/01/2016   Lab Results  Component Value Date   TSH 2.950 05/15/2017  .  Lipid Panel     Component Value Date/Time   CHOL 142 05/15/2017 0829   TRIG 123 05/15/2017 0829   TRIG 131 02/22/2013 0804   HDL 51 05/15/2017 0829   HDL 40 02/22/2013 0804   CHOLHDL 2.8 05/15/2017 0829   CHOLHDL 3.5 05/09/2015 1400   VLDL 31 (H) 05/09/2015 1400   LDLCALC 66 05/15/2017 0829   LDLCALC 50 02/22/2013 0804   RADIOLOGY: Dg Chest 2 View  08/17/2012   *RADIOLOGY REPORT*  Clinical Data: Renal cell carcinoma  CHEST - 2 VIEW  Comparison: 05/08/11  Findings: The cardiomediastinal silhouette is stable.  No acute infiltrate or pleural effusion.  No pulmonary edema.  Bony thorax is unremarkable.  IMPRESSION: No active disease.  No significant change.   Original Report Authenticated By: Lahoma Crocker, M.D.    IMPRESSION:  1. Medication management   2. CAD in native artery   3. SOB  (shortness of breath)   4. Hyperlipidemia with target LDL less than 70   5. Essential hypertension   6. Stage III chronic kidney disease [facility])     ASSESSMENT AND PLAN: Nicholas Douglas is a young appearing 84 year old gentleman who has CAD dating back to 75 and had undergone multiple interventions in over a 10 year period from 37 to 1997. He developed unstable angina symptomatology in March 2015 and catheterization done initially at [facility] showed multivessel disease.  He underwent successful staged rotational coronary atherectomy initially involving the RCA in early March, and then on 04/27/2013 repeat intervention was done to the left circumflex system with rotational atherectomy.  At that time, he had a widely patent stent of his RCA, as well as a widely patent stent in his LAD.  He also had 60-70% mid LAD stenosis, which had not progressed. In 2017 he noticed a change in symptomatology with the development of more shortness of breath, and no energy and felt occasional episodes of vague chest discomfort with possible left arm radiation.  He was hospitalized in late April 2017 and was found to have progressive 99% distal RCA stenosis after the PDA takeoff.  His stents in the LAD and circumflex were widely patent.  There was mild concomitant CAD in the circumflex.  His RCA was diffusely diseased and calcified with narrowings of 30% of the mid segment, but there was an angulated area of narrowing calcified narrowing of 40% before the acute margin, which prevented a stent from getting beyond this.  He underwent successful PTCA  to the distal stenosis.  When I was on vacation in early July, he develop recurrent symptoms which led to repeat cardiac catheterization which was done by Dr. Martinique on 08/05/2016.  His intervention sites were patent patent and there was no significant CAD progression.  There was mild 30-35% narrowing in the circumflex and mild 40% narrowing in the RCA.  The stents were patent as was  the prior PTCA site.  He has had issues with shortness of breath.  His ARB therapy had to be discontinued secondary to hyperkalemia, but at the time he was also having a fair amount of exogenous potassium intake.  Presently, his blood pressure in the office is stable.  However, he brought with him a list of blood pressure readings at home in the seen consistently in the 130s, 140s and occasionally 150s.  With his shortness of breath, I have suggested a trial of HCTZ and take 12.5 mg every other day.  In 2 to weeks, repeat be met will be obtained.  We have been aggressively treating his lipids and he is now on a regimen consisting of liver low 4 mg daily, a CIBA 1 capsule twice a day, in addition to Zetia 10 mg.  On 05/15/2017.  Total cholesterol was 142, triglycerides 123, HDL 51, and LDL 66.  He has gained weight.  BMI is 30.4 and consistent with mild obesity.  Some of his shortness of breath may also be contributed by his reduced activity as result of his winter associated with increased back discomfort.  He required 2 injections into his back.  His back discomfort is improvedand I have recommended resumption of aerobic activity.  Again reviewed his last catheterization data.  He continues to be on aspirin and Plavix for antiplatelet benefit.  I will see him in 6 months for reevaluation.  Time spent: 25 mnutes [name], MD, [facility]  05/22/2017 8:34 AM

## 2017-05-22 NOTE — Patient Instructions (Signed)
Medication Instructions:  START HCTZ 12.5 mg every other day  Labwork: Please return for labs in 2-3 weeks (BMET)  Our in office lab hours are Monday-Friday 8:00-4:00, closed for lunch 12:45-1:45 pm.  No appointment needed.  Follow-Up: Your physician wants you to follow-up in: 6 months with Dr. [name]. You will receive a reminder letter in the mail two months in advance. If you don't receive a letter, please call our office to schedule the follow-up appointment.   Any Other Special Instructions Will Be Listed Below (If Applicable).     If you need a refill on your cardiac medications before your next appointment, please call your pharmacy.

## 2017-05-22 NOTE — Addendum Note (Signed)
Addended by: Patria Mane A on: 05/22/2017 09:02 AM   Modules accepted: Orders

## 2017-06-02 ENCOUNTER — Other Ambulatory Visit: Payer: Self-pay | Admitting: Cardiovascular Disease

## 2017-06-02 NOTE — Telephone Encounter (Signed)
New Message:        *STAT* If patient is at the pharmacy, call can be transferred to refill team.   1. Which medications need to be refilled? (please list name of each medication and dose if known) ezetimibe (ZETIA) 10 MG tablet  2. Which pharmacy/location (including street and city if local pharmacy) is medication to be sent to?[facility] [place], [place]  3. Do they need a 30 day or 90 day supply? 30    Pt states they need to have 3 refills on this order.

## 2017-06-02 NOTE — Telephone Encounter (Signed)
REFILL 

## 2017-06-04 ENCOUNTER — Ambulatory Visit: Payer: Medicare Other

## 2017-06-10 ENCOUNTER — Telehealth: Payer: Self-pay | Admitting: Cardiovascular Disease

## 2017-06-10 LAB — BASIC METABOLIC PANEL
BUN / CREAT RATIO: 13 (ref 10–24)
BUN: 19 mg/dL (ref 8–27)
CALCIUM: 9.9 mg/dL (ref 8.6–10.2)
CHLORIDE: 103 mmol/L (ref 96–106)
CO2: 21 mmol/L (ref 20–29)
Creatinine, Ser: 1.44 mg/dL — ABNORMAL HIGH (ref 0.76–1.27)
GFR calc Af Amer: 51 mL/min/{1.73_m2} — ABNORMAL LOW (ref >59)
GFR, EST NON AFRICAN AMERICAN: 44 mL/min/{1.73_m2} — AB (ref >59)
Glucose: 120 mg/dL — ABNORMAL HIGH (ref 65–99)
POTASSIUM: 4.6 mmol/L (ref 3.5–5.2)
Sodium: 144 mmol/L (ref 134–144)

## 2017-06-10 MED ORDER — EZETIMIBE 10 MG PO TABS: 10.0000 mg | ORAL_TABLET | Freq: Every day | ORAL | 1 refills | Status: DC

## 2017-06-10 NOTE — Telephone Encounter (Signed)
°*  STAT* If patient is at the pharmacy, call can be transferred to refill team.   1. Which medications need to be refilled? (please list name of each medication and dose if known)  ezetimibe (ZETIA) 10 MG tablet Take 1 tablet (10 mg total) by mouth daily.   2. Which pharmacy/location (including street and city if local pharmacy) is medication to be sent to? [facility] [place] - [address] [phone] (Phone) [phone] (Fax)   3. Do they need a 30 day or 90 day supply? 90 and 3 refills  Please call wife when it has been sent over

## 2017-06-25 ENCOUNTER — Other Ambulatory Visit: Payer: Self-pay | Admitting: Cardiovascular Disease

## 2017-06-25 NOTE — Telephone Encounter (Signed)
REFILL 

## 2017-07-02 ENCOUNTER — Encounter: Payer: Self-pay | Admitting: Family Medicine

## 2017-08-17 ENCOUNTER — Other Ambulatory Visit: Payer: Self-pay | Admitting: Cardiovascular Disease

## 2017-08-21 ENCOUNTER — Other Ambulatory Visit: Payer: Self-pay | Admitting: Cardiovascular Disease

## 2017-08-21 MED ORDER — ISOSORBIDE MONONITRATE ER 60 MG PO TB24: 30.0000 mg | ORAL_TABLET | Freq: Every day | ORAL | 1 refills | Status: DC

## 2017-08-21 NOTE — Telephone Encounter (Signed)
°*  STAT* If patient is at the pharmacy, call can be transferred to refill team.   1. Which medications need to be refilled? (please list name of each medication and dose if known) Isosorbide  2. Which pharmacy/location (including street and city if local pharmacy) is medication to be sent to? [facility] 3. Do they need a 30 day or 90 day supply? 90 and refills

## 2017-08-21 NOTE — Telephone Encounter (Signed)
Submitted RX to pharmacy.

## 2017-09-03 ENCOUNTER — Other Ambulatory Visit: Payer: Self-pay | Admitting: Cardiovascular Disease

## 2017-09-03 NOTE — Telephone Encounter (Signed)
Rx sent to pharmacy   

## 2017-09-05 ENCOUNTER — Telehealth: Payer: Self-pay | Admitting: Cardiovascular Disease

## 2017-09-05 DIAGNOSIS — E785 Hyperlipidemia, unspecified: Secondary | ICD-10-CM

## 2017-09-05 DIAGNOSIS — I10 Essential (primary) hypertension: Secondary | ICD-10-CM

## 2017-09-05 NOTE — Telephone Encounter (Signed)
New message    Patient's wife  Is asking if labs can be ordered before the  Patient's appointment on 11/27/2017. Please advise.

## 2017-09-05 NOTE — Telephone Encounter (Signed)
Patients wife states that her husband always has labs drawn before upcoming appointments. I am not sure which labs you would want to repeat before the appointment.   Please advise and I will order so they can have them completed before October appointment with you. Thank you!

## 2017-09-05 NOTE — Telephone Encounter (Signed)
Ordered labs.   Called patient and advised that labs were ordered. Patient would like them drawn at a [facility] where he lives. I advised patient the closer the labs came to be drawn to call us and we can release them to [facility], or we could send them the order req to be drawn.   Patient verbalized understanding.

## 2017-09-05 NOTE — Telephone Encounter (Signed)
Can check Cmet, CBC, and fasting lipid studies prior to ov

## 2017-09-24 ENCOUNTER — Other Ambulatory Visit: Payer: Self-pay | Admitting: Cardiovascular Disease

## 2017-09-24 NOTE — Telephone Encounter (Signed)
Rx request sent to pharmacy.  

## 2017-10-28 ENCOUNTER — Other Ambulatory Visit: Payer: Self-pay | Admitting: Cardiovascular Disease

## 2017-11-06 ENCOUNTER — Telehealth: Payer: Self-pay | Admitting: Cardiovascular Disease

## 2017-11-06 ENCOUNTER — Other Ambulatory Visit: Payer: Self-pay | Admitting: Cardiovascular Disease

## 2017-11-06 DIAGNOSIS — I10 Essential (primary) hypertension: Secondary | ICD-10-CM

## 2017-11-06 DIAGNOSIS — E785 Hyperlipidemia, unspecified: Secondary | ICD-10-CM

## 2017-11-06 NOTE — Telephone Encounter (Signed)
New Message:     Pt wants to know if it is alright to have his lab work tomorrow? He is scheduled to see Dr [name] on 11-27-17.

## 2017-11-06 NOTE — Telephone Encounter (Signed)
Spoke to wife-advised ok to get lab work tomorrow.  OV 10/24

## 2017-11-07 LAB — CBC
HEMATOCRIT: 47.7 % (ref 37.5–51.0)
Hemoglobin: 16.3 g/dL (ref 13.0–17.7)
MCH: 30 pg (ref 26.6–33.0)
MCHC: 34.2 g/dL (ref 31.5–35.7)
MCV: 88 fL (ref 79–97)
PLATELETS: 297 10*3/uL (ref 150–450)
RBC: 5.43 x10E6/uL (ref 4.14–5.80)
RDW: 13.1 % (ref 12.3–15.4)
WBC: 7.5 10*3/uL (ref 3.4–10.8)

## 2017-11-07 LAB — COMPREHENSIVE METABOLIC PANEL
ALK PHOS: 67 IU/L (ref 39–117)
ALT: 46 IU/L — AB (ref 0–44)
AST: 40 IU/L (ref 0–40)
Albumin/Globulin Ratio: 1.5 (ref 1.2–2.2)
Albumin: 4.7 g/dL (ref 3.5–4.7)
BUN/Creatinine Ratio: 15 (ref 10–24)
BUN: 16 mg/dL (ref 8–27)
Bilirubin Total: 1.1 mg/dL (ref 0.0–1.2)
CO2: 23 mmol/L (ref 20–29)
Calcium: 10.1 mg/dL (ref 8.6–10.2)
Chloride: 101 mmol/L (ref 96–106)
Creatinine, Ser: 1.08 mg/dL (ref 0.76–1.27)
GFR calc Af Amer: 72 mL/min/{1.73_m2} (ref >59)
GFR calc non Af Amer: 63 mL/min/{1.73_m2} (ref >59)
GLUCOSE: 101 mg/dL — AB (ref 65–99)
Globulin, Total: 3.1 g/dL (ref 1.5–4.5)
Potassium: 5.4 mmol/L — ABNORMAL HIGH (ref 3.5–5.2)
Sodium: 145 mmol/L — ABNORMAL HIGH (ref 134–144)
TOTAL PROTEIN: 7.8 g/dL (ref 6.0–8.5)

## 2017-11-07 LAB — LIPID PANEL
CHOL/HDL RATIO: 2.6 ratio (ref 0.0–5.0)
Cholesterol, Total: 148 mg/dL (ref 100–199)
HDL: 57 mg/dL (ref >39)
LDL Calculated: 66 mg/dL (ref 0–99)
Triglycerides: 124 mg/dL (ref 0–149)
VLDL Cholesterol Cal: 25 mg/dL (ref 5–40)

## 2017-11-11 ENCOUNTER — Other Ambulatory Visit: Payer: Self-pay | Admitting: *Deleted

## 2017-11-11 DIAGNOSIS — Z79899 Other long term (current) drug therapy: Secondary | ICD-10-CM

## 2017-11-11 DIAGNOSIS — E875 Hyperkalemia: Secondary | ICD-10-CM

## 2017-11-18 ENCOUNTER — Other Ambulatory Visit: Payer: Self-pay | Admitting: Cardiovascular Disease

## 2017-11-19 LAB — CBC
Hematocrit: 43.8 % (ref 37.5–51.0)
Hemoglobin: 15.1 g/dL (ref 13.0–17.7)
MCH: 29.8 pg (ref 26.6–33.0)
MCHC: 34.5 g/dL (ref 31.5–35.7)
MCV: 87 fL (ref 79–97)
Platelets: 226 10*3/uL (ref 150–450)
RBC: 5.06 x10E6/uL (ref 4.14–5.80)
RDW: 12.6 % (ref 12.3–15.4)
WBC: 5.3 10*3/uL (ref 3.4–10.8)

## 2017-11-19 LAB — BASIC METABOLIC PANEL
BUN/Creatinine Ratio: 10 (ref 10–24)
BUN: 10 mg/dL (ref 8–27)
CO2: 25 mmol/L (ref 20–29)
CREATININE: 1.05 mg/dL (ref 0.76–1.27)
Calcium: 9.5 mg/dL (ref 8.6–10.2)
Chloride: 102 mmol/L (ref 96–106)
GFR calc Af Amer: 75 mL/min/{1.73_m2} (ref >59)
GFR calc non Af Amer: 65 mL/min/{1.73_m2} (ref >59)
GLUCOSE: 96 mg/dL (ref 65–99)
Potassium: 5 mmol/L (ref 3.5–5.2)
SODIUM: 142 mmol/L (ref 134–144)

## 2017-11-19 LAB — LIPID PANEL
Chol/HDL Ratio: 2.4 ratio (ref 0.0–5.0)
Cholesterol, Total: 124 mg/dL (ref 100–199)
HDL: 51 mg/dL (ref >39)
LDL Calculated: 57 mg/dL (ref 0–99)
Triglycerides: 82 mg/dL (ref 0–149)
VLDL Cholesterol Cal: 16 mg/dL (ref 5–40)

## 2017-11-27 ENCOUNTER — Ambulatory Visit (INDEPENDENT_AMBULATORY_CARE_PROVIDER_SITE_OTHER): Payer: Medicare Other | Admitting: Cardiovascular Disease

## 2017-11-27 ENCOUNTER — Encounter: Payer: Self-pay | Admitting: Cardiovascular Disease

## 2017-11-27 VITALS — BP 110/70 | HR 61 | Ht 67.5 in | Wt 189.0 lb

## 2017-11-27 DIAGNOSIS — I10 Essential (primary) hypertension: Secondary | ICD-10-CM

## 2017-11-27 DIAGNOSIS — E785 Hyperlipidemia, unspecified: Secondary | ICD-10-CM

## 2017-11-27 DIAGNOSIS — I700 Atherosclerosis of aorta: Secondary | ICD-10-CM | POA: Diagnosis not present

## 2017-11-27 DIAGNOSIS — I2510 Atherosclerotic heart disease of native coronary artery without angina pectoris: Secondary | ICD-10-CM

## 2017-11-27 DIAGNOSIS — Z79899 Other long term (current) drug therapy: Secondary | ICD-10-CM

## 2017-11-27 MED ORDER — AMLODIPINE BESYLATE 10 MG PO TABS: 10.0000 mg | ORAL_TABLET | Freq: Every day | ORAL | 3 refills | Status: DC

## 2017-11-27 MED ORDER — EZETIMIBE 10 MG PO TABS: 10.0000 mg | ORAL_TABLET | Freq: Every day | ORAL | 3 refills | Status: DC

## 2017-11-27 MED ORDER — ICOSAPENT ETHYL 1 G PO CAPS: 1.0000 | ORAL_CAPSULE | Freq: Two times a day (BID) | ORAL | 3 refills | Status: DC

## 2017-11-27 NOTE — Patient Instructions (Signed)
Medication Instructions:  Your physician recommends that you continue on your current medications as directed. Please refer to the Current Medication list given to you today.  If you need a refill on your cardiac medications before your next appointment, please call your pharmacy.   Follow-Up: At [facility], you and your health needs are our priority.  As part of our continuing mission to provide you with exceptional heart care, we have created designated Provider Care Teams.  These Care Teams include your primary Cardiologist (physician) and Advanced Practice Providers (APPs -  Physician Assistants and Nurse Practitioners) who all work together to provide you with the care you need, when you need it. You will need a follow up appointment in 6 months.  Please call our office 2 months in advance to schedule this appointment.  You may see Thomas Kelly, MD or one of the following Advanced Practice Providers on your designated Care Team: Hao Meng, PA-C . Angela Duke, PA-C   

## 2017-11-27 NOTE — Progress Notes (Signed)
Patient ID: JAAN FISCHEL, male   DOB: 1931-10-16, 84 y.o.   MRN: [id number]     HPI: Nicholas Douglas is an 84 y.o. Caucasian male who presents to the office today for a 6 month cardiology evaluation.  Mr. Brodhead has known CAD and underwent numerous interventions dating back to 5, 1991, 1993, and in 1997. Cardiac catheterization in 2011 which showed preserved LV function with mild residual distal inferior apical hypocontractility. There is evidence for coronary calcification with segmental narrowing of his LAD of 30-40% proximally, 50% diffusely, in the midsegment, and 70-80% in the distal region, he had AV groove circumflex stenoses of 70 and 50% with a 40% OM 2 stenosis, and a 30-40 and 50% RCA stenoses with 80-90% stenosis in the acute marginal branch. He had been on medical therapy.  He  presented to [facility] in March 2015 with class IV angina and catheterization demonstrated severe 2 vessel CAD with significant current calcification of the LAD with up to 95% mid LAD stenosis and diffuse proximal stenosis. He underwent successful high-speed rotational atherectomy the following day by Dr. Martinique and a 1.5 mm bur was used. Mid LAD was stented with a 2.75x38 mm Promus stent in the proximal LAD was stented with a 3.0x28 mm Promus stent. Medical therapy was recommended for his distal LAD stenosis. He has only one kidney and staged intervention to the left circumflex coronary artery was recommended.  He underwent staged left circumflex PCI on 04/27/2013 by me with successful high-speed rotational atherectomy with a 1.5 and 1.75 mm burr, and ultimately had a 2.5x28 mm Promus premier DES stent inserted into the circumflex vessel, which was post dilated to approximately 2.6 mm.  Subsequently he has felt significantly improved with resolution of any chest pain  He  has a history of significant hyperlipidemia and has had significant increased number of small LDL particles and insulin resistance. This seemed  to markedly improve with the addition of Niaspan added to zetia and lovaza.  Remotely, he had been on statins consisting of Lipitor and subsequently Crestor. He did have transient LFT elevation. He also concerns of possible risk of developing dementia with statin therapy.  He had  some episodes of Niaspan induced diffuse flushing. He wanted to stop taking his Niaspan. He did have subsequent NMR off Niaspan and done just on Zetia and as well as 2 g of Lovaza.  This showed increased abnormalities such that his total cholesterol was 201, LDL had risen to 124, but he now had LDL small particles which have increased from 685 to 1348. Laboratory on 10/26/2012: LDL particle number was elevated at 1657, LDL cholesterol 112 triglycerides 155 and total cholesterol 190. He continued to be insulin resistant with insulin resistance scored 65. When I last saw him, we elected to try Livalo and ultimately titrated this to 82 mg daily to take in addition to his Zetia 10 mg. Laboratory on 02/22/2013: LDL particle number was  markedly improved at 774 with an HDL of 40 calculated LDL 50 small LDL particle #417. Insulin resistance score was also improved at 51.  In July 2015 he developed abdominal discomfort and was found to have acute appendicitis. On CT imaging he was also noted to have prostate enlargement with nodularity and thickening of the bladder base and cystoscopy was suggested for further evaluation. He is status post left nephrectomy. He also was noted to have a 2.3 cm infrarenal suprailiac abdominal aortic aneurysm.  He has been on Livalo 2 mg,  Zetia 10 mg for hyperlipidemia.  Lab work on 12/22/2013 showed a cholesterol 155, triglycerides 130, HDL 45, LDL 84.  He has been on amlodipine 10 mg atenolol 12.5 mg twice a day and valsartan 80 mg daily for blood pressure.  He continues to be on aspirin and Plavix for dual antiplatelet therapy.  A nuclear perfusion study on 06/30/2014  revealed normal perfusion and function with  an ejection fraction of 57%.    When he underwent a CT scan for his appendicitis he was told of having a small abdominal aortic aneurysm.  His mother also had an abdominal aortic aneurysm.  The patient recently sprained his right ankle.  As result, he has not been able to be as active as he had in the past and has not been able to play golf which typically he had played up to 4 times per week, often scoring in the 70s.    He was recently admitted to the hospital in April 2017 with complaints of increasing episodes of chest discomfort.  Troponins were mildly positive at 0.12, consistent with a non-STEMI.  I performed cardiac catheterization on 05/28/2015.  This showed widely patent stents in the LAD and circumflex vessels.  The RCA was diffusely diseased with calcification in the proximal to mid region with distal 99% stenosis and a noncalcified distal RCA segment immediately after the PDA takeoff.  ECI was difficult due to the calcified RCA preventing placement of a stent since the stent was never able to be passed beyond this calcified angled segment despite even attempting guide liner support.  Ultimately, he underwent successful PTCA of the 99% stenosis, which was reduced to 0%.  Since his intervention he had noticed dramatic improvement in his previous symptomatology.  He continues to feel well and is playing golf 3 times per week. He had a basal cell cancer removed from his nose.   When I saw him in April 2018 he was without chest pain or significant shortness of breath and was playing golf at least 3 days per week.  Laboratory showed a total cholesterol 147, triglycerides 151, HDL 43, LDL 74 on regimen of Zetia 10 mg and Livalo 2 mg.  In the past he's been intolerant to Crestor, Lipitor, and Zocor.  I further titrated Livalo 24 mg daily which  he has tolerated. An abdominal ultrasound showed aortoiliac atherosclerosis without aneurysm.   When I saw him in June he had begun to notice increasing  shortness of breath with walking. He denies any of the chest tightness or pressure that he had experienced prior to his interventions.  At his last catheterization, he had extensive calcification in his RCA and PTCA alone was done since the stent was unable to reach the subtotal distal RCA.  He denies recurrent chest tightness.  He was questioning whether or not he could have had  pneumonia comtributing to his shortness of breath.  At that evaluation, I further titrated his Imdur to 60 mg daily.  His blood pressure was also elevated and I titrated his ARB therapy.  He sagittally saw Caro Hight as an add-on in with complaints of increasing symptomatology definitive cardiac catheterization was recommended.  He underwent repeat cardiac catheterization on 08/05/2016 by Dr. Peter Martinique.  This continues to show patency of the stents in the proximal to mid LAD and patency of the stented the mid circumflex as well as patent PTCA site of the PL OM vessel.  There was no new disease to explain his symptoms.  He had normal LV function and normal left ventricular end-diastolic pressure.  Following the catheterization, he has felt well, but he continues to experience some shortness of breath with weakness and decreased energy.  He denies any chest tightness.  Recent laboratory done 10 days ago revealed an elevated potassium at 5.7.  On recheck 3 days later was 5.4.  Subsequent, he has been taken off ARB therapy.   In August 2018 he was complaining of significant fatigue.  His blood pressure was stable and his pulse was 55.  I recommended he reduce his atenolol from 12.5 mg twice a day to just 12.5 mg at bedtime.  I also reduced his isosorbide from 60 mg down to 30 mg in light of his recent catheterization findings.  His prior hypokalemia, resolved with discontinuance of ARB, but more importantly with discontinuance of his excessive exogenous potassium intake with certain foods.   When I  saw him in October 2018 he was  fairly stable. Over the winter months he has had difficulty with his back and required injections.  This has limited his exercise.  He admits to gaining weight.  He has noticed that he is more short of breath with walking up hills.  Blood pressures at home often been in the 140-150 range.  He denies any chest pressure.  He is unaware of palpitations.  He had repeat lab work one week ago.  Renal function was stable with a creatinine 1.15.  Potassium was 4.7.  LFTs were normal.  Hemoglobin and hematocrit were stable.  Lipid studies were excellent.   When I last saw him in April 2019 his blood pressure recordings at home were in the 130-1 50 range and he had experience some shortness of breath.  I suggested a trial of HCTZ 12.5 mg every other day.  He was tolerating livalo 4 mg, vascepa and Zetia for hyperlipidemia.  Over the past 6 months, he has been without recurrent anginal symptoms.  He continues to play golf at least 3 days/week and typically score is 75 or below.  He underwent repeat laboratory last week and lipid studies remain excellent with total cholesterol 124, LDL 57, triglycerides 82, HDL 51.  Shortness of breath has improved.  He denies any orthopnea.  He presents for evaluation.  Past Medical History:  Diagnosis Date  . BPH (benign prostatic hyperplasia)    Followed by [name]/urology  . CAD S/P PTCA only of RPAV-PL 05/31/2015   99% --> 0%PAV - PTCA only (unable to advance STENT) due to RCA calcification - prox & distal RCA 30%; Patent LAD stent & Cx stent (~20% ISR).   . Calculus of kidney    "that's how they found the cancer" (04/27/2013)  . Coronary artery disease involving native coronary artery of native heart with unstable angina pectoris ([place]) 12/03/2011   S/P Cardiac angioplasty 1986, 1991, 1993, 1997.  Last cardiac catheterization 2001.  Cardiolite 05/2011 low risk with normal EF 65%.  Followed by cardiology/Shaughn [facility] of [facility] every six months.   . History of myocardial infarction  1976  . Hypertension   . Impotence of organic origin   . Internal hemorrhoids without mention of complication   . Malignant neoplasm of kidney, except pelvis   . Melanoma ([place]) 06/2010   Left arm  . 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  . Other abnormal blood chemistry   . Other nonspecific abnormal serum enzyme levels   . Personal history of colonic polyps   .  Pure hypercholesterolemia   . Tobacco use disorder   . Unspecified congenital cystic kidney disease   . Unstable angina ([place]) 04/05/2013; 05/2015    Past Surgical History:  Procedure Laterality Date  . APPENDECTOMY  2014  . CARDIAC CATHETERIZATION N/A 05/30/2015   Procedure: Left Heart Cath and Coronary Angiography;  Surgeon: [name], MD;  Location: [place] [facility];  Service: Cardiovascular;  Laterality: N/A;  . CARDIAC CATHETERIZATION N/A 05/30/2015   Procedure: Coronary Balloon Angioplasty;  Surgeon: [name], MD;  Location: [place] [facility];  Service: Cardiovascular;  Laterality: N/A;  . Cardiolite  05/06/2011   low risk study; normal EF.  [facility]  . carotid dopplers  03/08/2011   minimal plaque formation B. Symptoms: dizziness.  . COLONOSCOPY  10/05/2008   single polyp, [facility].  [name].  Repeat in 3 years.  . CORONARY ANGIOPLASTY     "I've had 4" (04/27/2013)  . CORONARY ANGIOPLASTY WITH STENT PLACEMENT  04/2013; 04/27/2013   "2 + 1" (04/27/2013)  . EYE SURGERY  11/04/2013   Cataract surgery B; [place].  Marland Kitchen LEFT HEART CATH AND CORONARY ANGIOGRAPHY N/A 08/05/2016   Procedure: Left Heart Cath and Coronary Angiography;  Surgeon: Martinique, Peter M, MD;  Location: [place] [facility];  Service: Cardiovascular;  Laterality: N/A;  . MELANOMA EXCISION Left ~ 2007   "arm"  . MELANOMA EXCISION  02/07/2015   R upper back. [facility]  . NEPHRECTOMY Left 2006  . PERCUTANEOUS CORONARY ROTOBLATOR INTERVENTION (PCI-R) N/A 04/06/2013   Procedure: PERCUTANEOUS CORONARY ROTOBLATOR INTERVENTION (PCI-R);  Surgeon: Peter M  Martinique, MD;  Location: [facility] C[facility] LAB;  Service: Cardiovascular;  Laterality: N/A;  . PERCUTANEOUS CORONARY STENT INTERVENTION (PCI-S) N/A 04/27/2013   Procedure: PERCUTANEOUS CORONARY STENT INTERVENTION (PCI-S);  Surgeon: [name], MD;  Location: [facility] C[facility] LAB;  Service: Cardiovascular;  Laterality: N/A;    Allergies  Allergen Reactions  . Angiotensin Receptor Blockers     Other reaction(s): Kidney Disorder Hyperkalemia  . Lovastatin Other (See Comments)    Elevated liver enzymes  . Morphine Hives  . Nifedipine Other (See Comments)    Elevated liver enzymes    Current Outpatient Medications  Medication Sig Dispense Refill  . acetaminophen (TYLENOL) 500 MG tablet Take 500 mg by mouth daily as needed for moderate pain.    Marland Kitchen amLODipine (NORVASC) 10 MG tablet Take 1 tablet (10 mg total) by mouth daily. 90 tablet 3  . aspirin 81 MG tablet Take 81 mg by mouth every evening.     Marland Kitchen atenolol (TENORMIN) 25 MG tablet Take 0.5 tablets (12.5 mg total) by mouth at bedtime. 90 tablet 3  . Carboxymethylcell-Hypromellose (GENTEAL OP) Apply 1 drop to eye 3 (three) times daily as needed (dry eyes).    . clopidogrel (PLAVIX) 75 MG tablet Take 1 tablet (75 mg total) by mouth daily. 90 tablet 1  . ezetimibe (ZETIA) 10 MG tablet Take 1 tablet (10 mg total) by mouth daily. 90 tablet 3  . finasteride (PROSCAR) 5 MG tablet Take 1 tablet (5 mg total) by mouth daily. (Patient taking differently: Take 2.5 mg by mouth daily. ) 90 tablet 3  . Icosapent Ethyl (VASCEPA) 1 g CAPS Take 1 capsule (1 g total) by mouth 2 (two) times daily. 180 capsule 3  . isosorbide mononitrate (IMDUR) 60 MG 24 hr tablet Take 0.5 tablets (30 mg total) by mouth daily. 90 tablet 1  . LIVALO 4 MG TABS TAKE 1 TABLET DAILY (KEEP OFFICE  VISIT) 90 tablet 1  . nitroGLYCERIN (NITROSTAT) 0.4 MG SL tablet PLACE 1 TABLET (0.4 MG TOTAL) UNDER THE TONGUE EVERY 5 (FIVE) MINUTES AS NEEDED FOR CHEST PAIN. 25 tablet 11   No current facility-administered  medications for this visit.     Socially he remains active. He is married has 5 children 10 grandchildren 2 great-grandchildren. Is no alcohol use. He typically scores below 75 and plays golf 3 days per week.  He typically scores in the 70s, better than his age.  ROS General: Negative; No fevers, chills, or night sweats;  Improved energy HEENT: Negative; No changes in vision or hearing, sinus congestion, difficulty swallowing Pulmonary: Positive for shortness of breath Cardiovascular: See history of present illness;  GI: Negative; No nausea, vomiting, diarrhea, or abdominal pain GU: Negative; No dysuria, hematuria, or difficulty voiding Musculoskeletal: Negative; no myalgias, joint pain, or weakness Hematologic/Oncology: Negative; no easy bruising, bleeding Endocrine: Negative; no heat/cold intolerance; no diabetes Neuro: Negative; no changes in balance, headaches Skin: Negative; No rashes or skin lesions Psychiatric: Negative; No behavioral problems, depression Sleep: Negative; No snoring, daytime sleepiness, hypersomnolence, bruxism, restless legs, hypnogognic hallucinations, no cataplexy Other comprehensive 14 point system review is negative.   PE BP 110/70   Pulse 61   Ht 5' 7.5" (1.715 m)   Wt 189 lb (85.7 kg)   BMI 29.16 kg/m    Repeat blood pressure by me was 120/80 without orthostatic change  Wt Readings from Last 3 Encounters:  11/27/17 189 lb (85.7 kg)  05/22/17 197 lb (89.4 kg)  11/21/16 192 lb (87.1 kg)   General: Alert, oriented, no distress.  Skin: normal turgor, no rashes, warm and dry HEENT: Normocephalic, atraumatic. Pupils equal round and reactive to light; sclera anicteric; extraocular muscles intact;  Nose without nasal septal hypertrophy Mouth/Parynx benign; Mallinpatti scale 3 Neck: No JVD, no carotid bruits; normal carotid upstroke Lungs: clear to ausculatation and percussion; no wheezing or rales Chest wall: without tenderness to palpitation Heart:  PMI not displaced, RRR, s1 s2 normal, 1/6 systolic murmur, no diastolic murmur, no rubs, gallops, thrills, or heaves Abdomen: soft, nontender; no hepatosplenomehaly, BS+; abdominal aorta nontender and not dilated by palpation. Back: no CVA tenderness Pulses 2+ Musculoskeletal: full range of motion, normal strength, no joint deformities Extremities: no clubbing cyanosis or edema, Homan's sign negative  Neurologic: grossly nonfocal; Cranial nerves grossly wnl Psychologic: Normal mood and affect   ECG (independently read by me): Normal sinus rhythm at 61 bpm.  No ectopy.  Normal intervals.  April 2019 ECG (independently read by me):normal sinus rhythm at 61 bpm.  No ectopy.  No ST segment changes.  October 2018 ECG (independently read by me): Sinus bradycardia 57 bpm.  No ST segment changes.  Normal intervals.  August 2018 ECG (independently read by me): Sinus bradycardia 55 bpm.  Normal intervals.  No significant ST-T changes  June 2018 ECG (independently read by me): Sinus bradycardia 58 bpm.  Normal intervals.  No significant ST-T changes.  April 2018 ECG (independently read by me): Normal sinus rhythm at 63 bpm.  Normal intervals.  No ST segment changes.  September 2017 ECG (independently read by me): Normal sinus rhythm at 60 bpm.  Nonspecific T changes.  Intervals are normal.  May 2017 ECG (independently read by me): Sinus bradycardia 55 bpm.  No ectopy.  Normal intervals.  Nondiagnostic T changes in lead 3.  April 2017 ECG (independently read by me): Sinus bradycardia 55 bpm.  No ectopy.  No significant ST  changes.  Normal intervals.  01/12/2015 ECG (independently read by me): Sinus bradycardia 54 bpm.  No ectopy.  Normal intervals.  June 2016 ECG (independently read by me): Sinus bradycardia 58 bpm.  Normal intervals.  No ectopy. Non-specific T change aVL  November 2015 ECG (independently read by me): Sinus bradycardia 54 bpm.  No significant ST-T changes.  Normal  intervals.  May 2015 ECG (independently read by me): Sinus bradycardia 54 beats per minute.  No ectopy.  QTc interval 396 ms.  No significant ST changes.  04/22/2013 ECG (independently read by me): Sinus bradycardia 55 beats per minute. Nonspecific ST changes  03/01/2013 ECG (independently read by me): Sinus bradycardia 52 beats per minute. Normal intervals. No significant ST changes.  Prior ECG of 11/20/2012: Sinus rhythm at 51 beats per minute. QTc interval 400 ms. No significant ST changes.  LABS:  BMP Latest Ref Rng & Units 11/18/2017 11/07/2017 06/09/2017  Glucose 65 - 99 mg/dL 96 101(H) 120(H)  BUN 8 - 27 mg/dL '10 16 19  ' Creatinine 0.76 - 1.27 mg/dL 1.05 1.08 1.44(H)  BUN/Creat Ratio 10 - '24 10 15 13  ' Sodium 134 - 144 mmol/L 142 145(H) 144  Potassium 3.5 - 5.2 mmol/L 5.0 5.4(H) 4.6  Chloride 96 - 106 mmol/L 102 101 103  CO2 20 - 29 mmol/L '25 23 21  ' Calcium 8.6 - 10.2 mg/dL 9.5 10.1 9.9   Hepatic Function Latest Ref Rng & Units 11/07/2017 05/15/2017 09/05/2016  Total Protein 6.0 - 8.5 g/dL 7.8 7.1 7.2  Albumin 3.5 - 4.7 g/dL 4.7 4.5 4.6  AST 0 - 40 IU/L 40 36 38  ALT 0 - 44 IU/L 46(H) 34 36  Alk Phosphatase 39 - 117 IU/L 67 58 59  Total Bilirubin 0.0 - 1.2 mg/dL 1.1 0.9 0.9  Bilirubin, Direct 0.00 - 0.40 mg/dL - - -   CBC Latest Ref Rng & Units 11/18/2017 11/07/2017 05/15/2017  WBC 3.4 - 10.8 x10E3/uL 5.3 7.5 5.7  Hemoglobin 13.0 - 17.7 g/dL 15.1 16.3 16.3  Hematocrit 37.5 - 51.0 % 43.8 47.7 48.0  Platelets 150 - 450 x10E3/uL 226 297 242   Lab Results  Component Value Date   MCV 87 11/18/2017   MCV 88 11/07/2017   MCV 88 05/15/2017   Lab Results  Component Value Date   TSH 2.950 05/15/2017  .  Lipid Panel     Component Value Date/Time   CHOL 124 11/18/2017 0820   TRIG 82 11/18/2017 0820   TRIG 131 02/22/2013 0804   HDL 51 11/18/2017 0820   HDL 40 02/22/2013 0804   CHOLHDL 2.4 11/18/2017 0820   CHOLHDL 3.5 05/09/2015 1400   VLDL 31 (H) 05/09/2015 1400   LDLCALC  57 11/18/2017 0820   LDLCALC 50 02/22/2013 0804   RADIOLOGY: Dg Chest 2 View  08/17/2012   *RADIOLOGY REPORT*  Clinical Data: Renal cell carcinoma  CHEST - 2 VIEW  Comparison: 05/08/11  Findings: The cardiomediastinal silhouette is stable.  No acute infiltrate or pleural effusion.  No pulmonary edema.  Bony thorax is unremarkable.  IMPRESSION: No active disease.  No significant change.   Original Report Authenticated By: Lahoma Crocker, M.D.    IMPRESSION:  1. CAD in native artery   2. Essential hypertension, benign   3. Medication management   4. Hyperlipidemia with target LDL less than 70   5. Atherosclerosis of aorta [facility])     ASSESSMENT AND PLAN: Mr. Mareon Robinette is a young appearing 84 year old gentleman who has CAD  dating back to 6 and had undergone multiple interventions  over a 10 year period from 78 to 76. He developed unstable angina symptomatology in March 2015 and catheterization done initially at [facility] showed multivessel disease.  He underwent successful staged rotational coronary atherectomy initially involving the RCA in early March, and then on 04/27/2013 repeat intervention was done to the left circumflex system with rotational atherectomy.  At that time, he had a widely patent stent of his RCA, as well as a widely patent stent in his LAD.  He also had 60-70% mid LAD stenosis, which had not progressed. In 2017 he noticed a change in symptomatology with the development of more shortness of breath, and no energy and felt occasional episodes of vague chest discomfort with possible left arm radiation.  He was hospitalized in late April 2017 and was found to have progressive 99% distal RCA stenosis after the PDA takeoff.  His stents in the LAD and circumflex were widely patent.  There was mild concomitant CAD in the circumflex.  His RCA was diffusely diseased and calcified with narrowings of 30% of the mid segment, but there was an angulated area of narrowing calcified narrowing of  40% before the acute margin, which prevented a stent from getting beyond this.  He underwent successful PTCA  to the distal stenosis.  His last catheterization in July 2018 his intervention sites were patent and there was no significant CAD progression. There was mild 30-35% narrowing in the circumflex and mild 40% narrowing in the RCA.  The stents were patent as was the prior PTCA site.  He has had issues with shortness of breath.  His ARB therapy had to be discontinued secondary to hyperkalemia, but at the time he was also having a fair amount of exogenous potassium intake.  Exam today, his blood pressure is excellent on his regimen now consisting of amlodipine 10 mg, atenolol 12.5 mg at bedtime, he has not had any recent swelling and is no longer on hydrochlorothiazide.  His lipid studies are excellent on recheck last week on his combination regimen of livalo 4 mg, vascepa capsule twice a day and Zetia 10 mg daily with most recent LDL at 57.  His ECG remains stable.  He is energy level has increased and he is now back playing golf at least 3 days/week.  He sees Dr. Emily Filbert for primary care.  He will undergo repeat laboratory in 6 months and follow-up office evaluation.  Time spent: 25 minutes [name], MD, [facility]  11/28/2017 2:40 PM

## 2017-11-28 ENCOUNTER — Encounter: Payer: Self-pay | Admitting: Cardiovascular Disease

## 2017-12-01 NOTE — Addendum Note (Signed)
Addended by: Patria Mane A on: 12/01/2017 10:30 AM   Modules accepted: Orders

## 2017-12-23 ENCOUNTER — Other Ambulatory Visit: Payer: Self-pay | Admitting: Internal Medicine

## 2017-12-23 DIAGNOSIS — E782 Mixed hyperlipidemia: Secondary | ICD-10-CM

## 2017-12-23 DIAGNOSIS — I714 Abdominal aortic aneurysm, without rupture, unspecified: Secondary | ICD-10-CM

## 2018-02-10 ENCOUNTER — Ambulatory Visit
Admission: RE | Admit: 2018-02-10 | Discharge: 2018-02-10 | Disposition: A | Discharge status: Home / Self Care | Payer: Medicare Other | Source: Ambulatory Visit | Attending: Internal Medicine | Admitting: Internal Medicine

## 2018-02-10 DIAGNOSIS — E782 Mixed hyperlipidemia: Secondary | ICD-10-CM | POA: Insufficient documentation

## 2018-02-10 DIAGNOSIS — I714 Abdominal aortic aneurysm, without rupture, unspecified: Secondary | ICD-10-CM

## 2018-03-03 ENCOUNTER — Other Ambulatory Visit: Payer: Self-pay | Admitting: Cardiovascular Disease

## 2018-04-20 ENCOUNTER — Other Ambulatory Visit: Payer: Self-pay | Admitting: Cardiovascular Disease

## 2018-04-27 ENCOUNTER — Other Ambulatory Visit: Payer: Self-pay | Admitting: Cardiovascular Disease

## 2018-05-26 LAB — COMPREHENSIVE METABOLIC PANEL
ALT: 41 IU/L (ref 0–44)
AST: 36 IU/L (ref 0–40)
Albumin/Globulin Ratio: 1.5 (ref 1.2–2.2)
Albumin: 4.6 g/dL (ref 3.6–4.6)
Alkaline Phosphatase: 67 IU/L (ref 39–117)
BILIRUBIN TOTAL: 0.9 mg/dL (ref 0.0–1.2)
BUN/Creatinine Ratio: 14 (ref 10–24)
BUN: 17 mg/dL (ref 8–27)
CALCIUM: 9.8 mg/dL (ref 8.6–10.2)
CHLORIDE: 101 mmol/L (ref 96–106)
CO2: 23 mmol/L (ref 20–29)
Creatinine, Ser: 1.24 mg/dL (ref 0.76–1.27)
GFR calc non Af Amer: 53 mL/min/{1.73_m2} — ABNORMAL LOW (ref >59)
GFR, EST AFRICAN AMERICAN: 61 mL/min/{1.73_m2} (ref >59)
Globulin, Total: 3.1 g/dL (ref 1.5–4.5)
Glucose: 95 mg/dL (ref 65–99)
Potassium: 5.1 mmol/L (ref 3.5–5.2)
Sodium: 142 mmol/L (ref 134–144)
TOTAL PROTEIN: 7.7 g/dL (ref 6.0–8.5)

## 2018-05-26 LAB — CBC
HEMOGLOBIN: 16.5 g/dL (ref 13.0–17.7)
Hematocrit: 50.2 % (ref 37.5–51.0)
MCH: 29.9 pg (ref 26.6–33.0)
MCHC: 32.9 g/dL (ref 31.5–35.7)
MCV: 91 fL (ref 79–97)
Platelets: 261 10*3/uL (ref 150–450)
RBC: 5.52 x10E6/uL (ref 4.14–5.80)
RDW: 12.7 % (ref 11.6–15.4)
WBC: 6.7 10*3/uL (ref 3.4–10.8)

## 2018-05-26 LAB — LIPID PANEL
CHOL/HDL RATIO: 2.8 ratio (ref 0.0–5.0)
Cholesterol, Total: 146 mg/dL (ref 100–199)
HDL: 52 mg/dL (ref >39)
LDL CALC: 72 mg/dL (ref 0–99)
TRIGLYCERIDES: 109 mg/dL (ref 0–149)
VLDL Cholesterol Cal: 22 mg/dL (ref 5–40)

## 2018-05-26 LAB — TSH: TSH: 4.71 u[IU]/mL — ABNORMAL HIGH (ref 0.450–4.500)

## 2018-05-28 ENCOUNTER — Ambulatory Visit: Payer: Medicare Other | Admitting: Cardiovascular Disease

## 2018-06-16 ENCOUNTER — Telehealth: Payer: Self-pay | Admitting: Internal Medicine

## 2018-06-16 NOTE — Telephone Encounter (Signed)
Smartphone/ consent given/ my chart active/ pre reg completed °

## 2018-06-17 ENCOUNTER — Encounter: Payer: Self-pay | Admitting: Cardiovascular Disease

## 2018-06-17 ENCOUNTER — Telehealth (INDEPENDENT_AMBULATORY_CARE_PROVIDER_SITE_OTHER): Payer: Medicare Other | Admitting: Cardiovascular Disease

## 2018-06-17 VITALS — BP 151/74 | HR 47 | Ht 67.5 in | Wt 184.5 lb

## 2018-06-17 DIAGNOSIS — I10 Essential (primary) hypertension: Secondary | ICD-10-CM | POA: Diagnosis not present

## 2018-06-17 DIAGNOSIS — R7989 Other specified abnormal findings of blood chemistry: Secondary | ICD-10-CM

## 2018-06-17 DIAGNOSIS — I700 Atherosclerosis of aorta: Secondary | ICD-10-CM | POA: Diagnosis not present

## 2018-06-17 DIAGNOSIS — E785 Hyperlipidemia, unspecified: Secondary | ICD-10-CM

## 2018-06-17 DIAGNOSIS — I2510 Atherosclerotic heart disease of native coronary artery without angina pectoris: Secondary | ICD-10-CM

## 2018-06-17 NOTE — Patient Instructions (Signed)

## 2018-06-17 NOTE — Progress Notes (Signed)
Virtual Visit via Video Note   This visit type was conducted due to national recommendations for restrictions regarding the COVID-19 Pandemic (e.g. social distancing) in an effort to limit this patient's exposure and mitigate transmission in our community.  Due to his co-morbid illnesses, this patient is at least at moderate risk for complications without adequate follow up.  This format is felt to be most appropriate for this patient at this time.  All issues noted in this document were discussed and addressed.  A limited physical exam was performed with this format.  Please refer to the patient's chart for his consent to telehealth for [facility].   Date:  06/17/2018   ID:  Nicholas Douglas, DOB 31-Aug-1931, MRN [id number]  Patient Location: Home Provider Location: Office  PCP:  Rusty Aus, MD  Cardiologist:  [name], MD Electrophysiologist:  None   Evaluation Performed:  Follow-Up Visit  Chief Complaint: 79-month follow-up evaluation  History of Present Illness:    Nicholas Douglas is a 85 y.o. male with  known CAD and underwent numerous interventions dating back to [place], and in 1997. Cardiac catheterization in 2011 which showed preserved LV function with mild residual distal inferior apical hypocontractility. There is evidence for coronary calcification with segmental narrowing of his LAD of 30-40% proximally, 50% diffusely, in the midsegment, and 70-80% in the distal region, he had AV groove circumflex stenoses of 70 and 50% with a 40% OM 2 stenosis, and a 30-40 and 50% RCA stenoses with 80-90% stenosis in the acute marginal branch. He had been on medical therapy.  He  presented to [facility] in March 2015 with class IV angina and catheterization demonstrated severe 2 vessel CAD with significant current calcification of the LAD with up to 95% mid LAD stenosis and diffuse proximal stenosis. He underwent successful high-speed rotational atherectomy the following day  by Dr. Martinique and a 1.5 mm bur was used. Mid LAD was stented with a 2.75x38 mm Promus stent in the proximal LAD was stented with a 3.0x28 mm Promus stent. Medical therapy was recommended for his distal LAD stenosis. He has only one kidney and staged intervention to the left circumflex coronary artery was recommended.  He underwent staged left circumflex PCI on 04/27/2013 by me with successful high-speed rotational atherectomy with a 1.5 and 1.75 mm burr, and ultimately had a 2.5x28 mm Promus premier DES stent inserted into the circumflex vessel, which was post dilated to approximately 2.6 mm.  Subsequently he has felt significantly improved with resolution of any chest pain  He  has a history of significant hyperlipidemia and has had significant increased number of small LDL particles and insulin resistance. This seemed to markedly improve with the addition of Niaspan added to zetia and lovaza.  Remotely, he had been on statins consisting of Lipitor and subsequently Crestor. He did have transient LFT elevation. He also concerns of possible risk of developing dementia with statin therapy.  He had  some episodes of Niaspan induced diffuse flushing. He wanted to stop taking his Niaspan. He did have subsequent NMR off Niaspan and done just on Zetia and as well as 2 g of Lovaza.  This showed increased abnormalities such that his total cholesterol was 201, LDL had risen to 124, but he now had LDL small particles which have increased from 685 to 1348. Laboratory on 10/26/2012: LDL particle number was elevated at 1657, LDL cholesterol 112 triglycerides 155 and total cholesterol 190. He continued to be  insulin resistant with insulin resistance scored 65. When I last saw him, we elected to try Livalo and ultimately titrated this to 4 mg daily to take in addition to his Zetia 10 mg. Laboratory on 02/22/2013: LDL particle number was  markedly improved at 774 with an HDL of 40 calculated LDL 50 small LDL particle #[id number].  Insulin resistance score was also improved at 51.  In July 2015 he developed abdominal discomfort and was found to have acute appendicitis. On CT imaging he was also noted to have prostate enlargement with nodularity and thickening of the bladder base and cystoscopy was suggested for further evaluation. He is status post left nephrectomy. He also was noted to have a 2.3 cm infrarenal suprailiac abdominal aortic aneurysm.  He has been on Livalo 2 mg, Zetia 10 mg for hyperlipidemia.  Lab work on 12/22/2013 showed a cholesterol 155, triglycerides 130, HDL 45, LDL 84.  He has been on amlodipine 10 mg atenolol 12.5 mg twice a day and valsartan 80 mg daily for blood pressure.  He continues to be on aspirin and Plavix for dual antiplatelet therapy.  A nuclear perfusion study on 06/30/2014  revealed normal perfusion and function with an ejection fraction of 57%.    When he underwent a CT scan for his appendicitis he was told of having a small abdominal aortic aneurysm.  His mother also had an abdominal aortic aneurysm.  The patient recently sprained his right ankle.  As result, he has not been able to be as active as he had in the past and has not been able to play golf which typically he had played up to 4 times per week, often scoring in the 70s.    He was recently admitted to the hospital in April 2017 with complaints of increasing episodes of chest discomfort.  Troponins were mildly positive at 0.12, consistent with a non-STEMI.  I performed cardiac catheterization on 05/28/2015.  This showed widely patent stents in the LAD and circumflex vessels.  The RCA was diffusely diseased with calcification in the proximal to mid region with distal 99% stenosis and a noncalcified distal RCA segment immediately after the PDA takeoff.  ECI was difficult due to the calcified RCA preventing placement of a stent since the stent was never able to be passed beyond this calcified angled segment despite even attempting guide  liner support.  Ultimately, he underwent successful PTCA of the 99% stenosis, which was reduced to 0%.  Since his intervention he had noticed dramatic improvement in his previous symptomatology.  He continues to feel well and is playing golf 3 times per week. He had a basal cell cancer removed from his nose.   When I saw him in April 2018 he was without chest pain or significant shortness of breath and was playing golf at least 3 days per week.  Laboratory showed a total cholesterol 147, triglycerides 151, HDL 43, LDL 74 on regimen of Zetia 10 mg and Livalo 2 mg.  In the past he's been intolerant to Crestor, Lipitor, and Zocor.  I further titrated Livalo 24 mg daily which  he has tolerated. An abdominal ultrasound showed aortoiliac atherosclerosis without aneurysm.   When I saw him in June 2018 he had begun to notice increasing shortness of breath with walking. He denies any of the chest tightness or pressure that he had experienced prior to his interventions.  At his last catheterization, he had extensive calcification in his RCA and PTCA alone was done since the stent  was unable to reach the subtotal distal RCA.  He denies recurrent chest tightness.  He was questioning whether or not he could have had  pneumonia comtributing to his shortness of breath.  At that evaluation, I further titrated his Imdur to 60 mg daily.  His blood pressure was also elevated and I titrated his ARB therapy.  He sagittally saw Caro Hight as an add-on in with complaints of increasing symptomatology definitive cardiac catheterization was recommended.  He underwent repeat cardiac catheterization on 08/05/2016 by Dr. Peter Martinique.  This continues to show patency of the stents in the proximal to mid LAD and patency of the stented the mid circumflex as well as patent PTCA site of the PL OM vessel.  There was no new disease to explain his symptoms.  He had normal LV function and normal left ventricular end-diastolic  pressure.  Following the catheterization, he has felt well, but he continues to experience some shortness of breath with weakness and decreased energy.  He denies any chest tightness.  Recent laboratory done 10 days ago revealed an elevated potassium at 5.7.  On recheck 3 days later was 5.4.  Subsequent, he has been taken off ARB therapy.   In August 2018 he was complaining of significant fatigue.  His blood pressure was stable and his pulse was 55.  I recommended he reduce his atenolol from 12.5 mg twice a day to just 12.5 mg at bedtime.  I also reduced his isosorbide from 60 mg down to 30 mg in light of his recent catheterization findings.  His prior hypokalemia, resolved with discontinuance of ARB, but more importantly with discontinuance of his excessive exogenous potassium intake with certain foods.   When I  saw him in October 2018 he was fairly stable. Over the winter months he has had difficulty with his back and required injections.  This has limited his exercise.  He admits to gaining weight.  He has noticed that he is more short of breath with walking up hills.  Blood pressures at home often been in the 140-150 range.  He denies any chest pressure.  He is unaware of palpitations.  He had repeat lab work one week ago.  Renal function was stable with a creatinine 1.15.  Potassium was 4.7.  LFTs were normal.  Hemoglobin and hematocrit were stable.  Lipid studies were excellent.   When I  saw him in April 2019 his blood pressure recordings at home were in the 130-1 50 range and he had experience some shortness of breath.  I suggested a trial of HCTZ 12.5 mg every other day.  He was tolerating livalo 4 mg, vascepa and Zetia for hyperlipidemia.  Over the past 6 months, he has been without recurrent anginal symptoms.  He continues to play golf at least 3 days/week and typically score is 75 or below.  He underwent repeat laboratory last week and lipid studies remain excellent with total cholesterol 124,  LDL 57, triglycerides 82, HDL 51.    I last saw him in October 2019 at which time he was feeling well and his shortness of breath had improved.  His blood pressure was excellent on a regimen consisting of amlodipine 10 mg, atenolol 12.5 mg at bedtime and he had not had any recent swelling.  He was no longer taking HCTZ.  He was playing golf 3 days/week.  Since I last saw him, he has continued to feel well.  He denies any symptoms of chest pain PND orthopnea.  He is still playing golf at least 3 days/week and his highest score is 82 over the past year.  He does not routinely exercise on the days he does not play golf.  He states his blood pressures typically has been running in the 628B with diastolics in the 15V to 76H.  He denies any palpitations, episodes of presyncope or syncope.  He presents for evaluation.    The patient does not have symptoms concerning for COVID-19 infection (fever, chills, cough, or new shortness of breath).    Past Medical History:  Diagnosis Date   BPH (benign prostatic hyperplasia)    Followed by [name]/urology   CAD S/P PTCA only of RPAV-PL 05/31/2015   99% --> 0%PAV - PTCA only (unable to advance STENT) due to RCA calcification - prox & distal RCA 30%; Patent LAD stent & Cx stent (~20% ISR).    Calculus of kidney    "that's how they found the cancer" (04/27/2013)   Coronary artery disease involving native coronary artery of native heart with unstable angina pectoris ([place]) 12/03/2011   S/P Cardiac angioplasty 1986, 1991, 1993, 1997.  Last cardiac catheterization 2001.  Cardiolite 05/2011 low risk with normal EF 65%.  Followed by cardiology/Cniyah Sprou[facility] of [facility] every six months.    History of myocardial infarction 1976   Hypertension    Impotence of organic origin    Internal hemorrhoids without mention of complication    Malignant neoplasm of kidney, except pelvis    Melanoma ([place]) 06/2010   Left arm   Melanoma of back ([place]) 02/07/2015   R upper back;  excision [facility].   Other abnormal blood chemistry    Other nonspecific abnormal serum enzyme levels    Personal history of colonic polyps    Pure hypercholesterolemia    Tobacco use disorder    Unspecified congenital cystic kidney disease    Unstable angina ([place]) 04/05/2013; 05/2015   Past Surgical History:  Procedure Laterality Date   APPENDECTOMY  2014   CARDIAC CATHETERIZATION N/A 05/30/2015   Procedure: Left Heart Cath and Coronary Angiography;  Surgeon: [name], MD;  Location: [facility];  Service: Cardiovascular;  Laterality: N/A;   CARDIAC CATHETERIZATION N/A 05/30/2015   Procedure: Coronary Balloon Angioplasty;  Surgeon: [name], MD;  Location: [place] [facility];  Service: Cardiovascular;  Laterality: N/A;   Cardiolite  05/06/2011   low risk study; normal EF.  [facility]   carotid dopplers  03/08/2011   minimal plaque formation B. Symptoms: dizziness.   COLONOSCOPY  10/05/2008   single polyp, IH.  [name].  Repeat in 3 years.   CORONARY ANGIOPLASTY     "I've had 4" (04/27/2013)   CORONARY ANGIOPLASTY WITH STENT PLACEMENT  04/2013; 04/27/2013   "2 + 1" (04/27/2013)   EYE SURGERY  11/04/2013   Cataract surgery B; [place].   LEFT HEART CATH AND CORONARY ANGIOGRAPHY N/A 08/05/2016   Procedure: Left Heart Cath and Coronary Angiography;  Surgeon: Martinique, Peter M, MD;  Location: [place] [facility];  Service: Cardiovascular;  Laterality: N/A;   MELANOMA EXCISION Left ~ 2007   "arm"   MELANOMA EXCISION  02/07/2015   R upper back. [facility]   NEPHRECTOMY Left 2006   PERCUTANEOUS CORONARY ROTOBLATOR INTERVENTION (PCI-R) N/A 04/06/2013   Procedure: PERCUTANEOUS CORONARY ROTOBLATOR INTERVENTION (PCI-R);  Surgeon: [name], MD;  Location: [facility] CATH LAB;  Service: Cardiovascular;  Laterality: N/A;   PERCUTANEOUS CORONARY STENT INTERVENTION (PCI-S) N/A 04/27/2013   Procedure:  PERCUTANEOUS CORONARY STENT INTERVENTION (PCI-S);  Surgeon: [name], MD;  Location: [facility] CATH  LAB;  Service: Cardiovascular;  Laterality: N/A;     Current Meds  Medication Sig   acetaminophen (TYLENOL) 500 MG tablet Take 500 mg by mouth daily as needed for moderate pain.   amLODipine (NORVASC) 10 MG tablet TAKE 1 TABLET DAILY   aspirin 81 MG tablet Take 81 mg by mouth every evening.    atenolol (TENORMIN) 25 MG tablet Take 0.5 tablets (12.5 mg total) by mouth at bedtime.   Carboxymethylcell-Hypromellose (GENTEAL OP) Apply 1 drop to eye 3 (three) times daily as needed (dry eyes).   clopidogrel (PLAVIX) 75 MG tablet TAKE 1 TABLET DAILY   ezetimibe (ZETIA) 10 MG tablet Take 1 tablet (10 mg total) by mouth daily.   finasteride (PROSCAR) 5 MG tablet Take 1 tablet (5 mg total) by mouth daily. (Patient taking differently: Take 2.5 mg by mouth daily. )   Icosapent Ethyl (VASCEPA) 1 g CAPS Take 1 capsule (1 g total) by mouth 2 (two) times daily.   isosorbide mononitrate (IMDUR) 60 MG 24 hr tablet Take 0.5 tablets (30 mg total) by mouth daily.   LIVALO 4 MG TABS TAKE 1 TABLET DAILY (KEEP OFFICE VISIT)   nitroGLYCERIN (NITROSTAT) 0.4 MG SL tablet PLACE 1 TABLET (0.4 MG TOTAL) UNDER THE TONGUE EVERY 5 (FIVE) MINUTES AS NEEDED FOR CHEST PAIN.     Allergies:   Angiotensin receptor blockers; Lovastatin; Morphine; and Nifedipine   Social History   Tobacco Use   Smoking status: Former Smoker    Types: Pipe, Cigars   Smokeless tobacco: Former Systems developer    Types: Chew   Tobacco comment: 04/27/2013 "quit smoking & chewing last year"  Substance Use Topics   Alcohol use: Yes    Alcohol/week: 5.0 standard drinks    Types: 5 Glasses of wine per week   Drug use: No    Socially he remains active. He is married has 5 children 10 grandchildren 2 great-grandchildren. Is no alcohol use. He typically scores below 75 and plays golf 3 days per week.  He typically scores in the 70s, better than his age.His highst score over the year is 82.   Family Hx: The patient's family history includes AAA  (abdominal aortic aneurysm) in his mother; Heart disease in his mother.  ROS:   Please see the history of present illness.    No fevers chills night sweats, change in smell or taste. Improved energy Slight difficulty in hearing No wheezing No chest pain PND orthopnea, presyncope or syncope or palpitations No easy bruising or bleeding No cold or heat intolerance, no diabetes No headaches No rash Sleeping well All other systems reviewed and are negative.   Prior CV studies:   The following studies were reviewed today:  I reviewed his most recent laboratory from May 25, 2018  Labs/Other Tests and Data Reviewed:    EKG:  An ECG dated 11/27/2017 was personally reviewed today and demonstrated:  Normal sinus rhythm at 61 bpm.  No ectopy.  Normal intervals.  Recent Labs: 05/25/2018: ALT 41; BUN 17; Creatinine, Ser 1.24; Hemoglobin 16.5; Platelets 261; Potassium 5.1; Sodium 142; TSH 4.710   Recent Lipid Panel Lab Results  Component Value Date/Time   CHOL 146 05/25/2018 08:06 AM   TRIG 109 05/25/2018 08:06 AM   TRIG 131 02/22/2013 08:04 AM   HDL 52 05/25/2018 08:06 AM   HDL 40 02/22/2013 08:04 AM   CHOLHDL 2.8 05/25/2018 08:06 AM  CHOLHDL 3.5 05/09/2015 02:00 PM   LDLCALC 72 05/25/2018 08:06 AM   LDLCALC 50 02/22/2013 08:04 AM    Wt Readings from Last 3 Encounters:  06/17/18 184 lb 8 oz (83.7 kg)  11/27/17 189 lb (85.7 kg)  05/22/17 197 lb (89.4 kg)     Objective:    Vital Signs:  BP (!) 151/74    Pulse (!) 47    Ht 5' 7.5" (1.715 m)    Wt 184 lb 8 oz (83.7 kg)    BMI 28.47 kg/m    He is well-developed and well-nourished in no acute distress. Respirations are normal and unlabored He is hard of hearing There is no audible wheezing He denies any discomfort of his chest with palpation He is unaware of any abdominal discomfort There is no leg swelling per his report There is no myalgias He denies any neurologic issues He has normal cognition, mood and  affect    ASSESSMENT & PLAN:    1. CAD: Documented since 1987 and initially required multiple interventions over 10-year.  Through 1997.  His last intervention was in 2015 with rotational atherectomy of the calcified left circumflex system.  At that time he had widely patent stent to his RCA as well as a patent stent in his LAD.  There was a 60 to 70% mid LAD stenosis which had not progressed.  He currently is not having any anginal symptomatology on his medical therapy 2. Essential hypertension: At his last office visit blood pressure was excellent.  Blood pressure today at home was slightly increased.  According to his wife he has not been extremely diligent with sodium intake.  He continues to be on amlodipine 10 mg in addition to atenolol 12.5 mg.  He no longer has any leg swelling and is not taking HCTZ.  I have recommended sodium restriction.  We discussed walking on days that he does not play golf.  If his blood pressure continues to be in the upper 140s or 150s he may require reinstitution of low-dose HCTZ.  In the past, ARB therapy had been discontinued secondary to hyperkalemia.  He will contact our office if issues arise 3. Hyperlipidemia with target less than 70.  He has been intolerant to numerous statins but is able to tolerate Livalo 4 mg in addition to Zetia 10 mg.  Post recent laboratory was reviewed.  LDL cholesterol had increased slightly to 72 from 57 at his last evaluation. 4. Minimal TSH elevation: TSH was 4.7.  At present no need for supplementation.  We will monitor in the future. 5. Atherosclerosis of the aorta: Documented on prior CT  COVID-19 Education: The signs and symptoms of COVID-19 were discussed with the patient and how to seek care for testing (follow up with PCP or arrange E-visit).  The importance of social distancing was discussed today.  Time:   Today, I have spent 22 minutes with the patient with telehealth technology discussing the above problems.      Medication Adjustments/Labs and Tests Ordered: Current medicines are reviewed at length with the patient today.  Concerns regarding medicines are outlined above.   Tests Ordered: No orders of the defined types were placed in this encounter.   Medication Changes: No orders of the defined types were placed in this encounter.   Disposition:  Follow up 6 months  Signed, [name], MD  06/17/2018 8:11 AM    [place]

## 2018-09-29 ENCOUNTER — Other Ambulatory Visit: Payer: Self-pay | Admitting: Cardiovascular Disease

## 2018-10-13 ENCOUNTER — Other Ambulatory Visit: Payer: Self-pay | Admitting: Cardiovascular Disease

## 2018-11-06 ENCOUNTER — Telehealth: Payer: Self-pay | Admitting: Cardiovascular Disease

## 2018-11-06 DIAGNOSIS — Z79899 Other long term (current) drug therapy: Secondary | ICD-10-CM

## 2018-11-06 DIAGNOSIS — E785 Hyperlipidemia, unspecified: Secondary | ICD-10-CM

## 2018-11-06 DIAGNOSIS — I10 Essential (primary) hypertension: Secondary | ICD-10-CM

## 2018-11-06 NOTE — Telephone Encounter (Signed)
New Message:   Pt would like to have a lab order mailed to him please. Hewants to have his lab work before his appt on 01-13-19 with Dr [name].

## 2018-11-06 NOTE — Telephone Encounter (Signed)
Yes okay to reorder- will you mail out to him the lab slips. Thank you!

## 2018-11-06 NOTE — Telephone Encounter (Signed)
Labs ordered and mailed Left detailed message

## 2018-11-12 ENCOUNTER — Other Ambulatory Visit: Payer: Self-pay | Admitting: Cardiovascular Disease

## 2018-11-12 NOTE — Telephone Encounter (Signed)
Rx request sent to pharmacy.  

## 2018-11-23 ENCOUNTER — Other Ambulatory Visit: Payer: Self-pay | Admitting: Cardiovascular Disease

## 2018-12-21 ENCOUNTER — Ambulatory Visit (INDEPENDENT_AMBULATORY_CARE_PROVIDER_SITE_OTHER): Payer: Medicare Other | Admitting: Cardiovascular Disease

## 2018-12-21 ENCOUNTER — Other Ambulatory Visit: Payer: Self-pay

## 2018-12-21 ENCOUNTER — Telehealth: Payer: Self-pay | Admitting: Cardiovascular Disease

## 2018-12-21 VITALS — BP 176/93 | HR 77 | Temp 97.9°F | Ht 67.0 in | Wt 190.4 lb

## 2018-12-21 DIAGNOSIS — R0600 Dyspnea, unspecified: Secondary | ICD-10-CM | POA: Diagnosis not present

## 2018-12-21 DIAGNOSIS — I2510 Atherosclerotic heart disease of native coronary artery without angina pectoris: Secondary | ICD-10-CM

## 2018-12-21 DIAGNOSIS — E785 Hyperlipidemia, unspecified: Secondary | ICD-10-CM

## 2018-12-21 DIAGNOSIS — I209 Angina pectoris, unspecified: Secondary | ICD-10-CM

## 2018-12-21 DIAGNOSIS — I10 Essential (primary) hypertension: Secondary | ICD-10-CM | POA: Diagnosis not present

## 2018-12-21 DIAGNOSIS — R0609 Other forms of dyspnea: Secondary | ICD-10-CM

## 2018-12-21 DIAGNOSIS — I700 Atherosclerosis of aorta: Secondary | ICD-10-CM

## 2018-12-21 MED ORDER — ATENOLOL 25 MG PO TABS: 12.5000 mg | ORAL_TABLET | Freq: Two times a day (BID) | ORAL | 3 refills | Status: DC

## 2018-12-21 MED ORDER — ISOSORBIDE MONONITRATE ER 60 MG PO TB24: 60.0000 mg | ORAL_TABLET | Freq: Every day | ORAL | 6 refills | Status: DC

## 2018-12-21 NOTE — Patient Instructions (Addendum)
Echocardiogram - Your physician has requested that you have an echocardiogram. Echocardiography is a painless test that uses sound waves to create images of your heart. It provides your doctor with information about the size and shape of your heart and how well your heart's chambers and valves are working. This procedure takes approximately one hour. There are no restrictions for this procedure. This will be performed at our [facility] location - [address].  You are scheduled for a Cardiac Catheterization on Monday, November 30 with Dr. [name].  1. Please arrive at the [facility] (Main Entrance A) at [facility]: [address] [place], [place] [address] at 8:30 AM (This time is two hours before your procedure to ensure your preparation). Free valet parking service is available.   Special note: Every effort is made to have your procedure done on time. Please understand that emergencies sometimes delay scheduled procedures.  2. Diet: Do not eat solid foods after midnight.  The patient may have clear liquids until 5am upon the day of the procedure.  3. Labs: You will need to have blood drawn on Wednesday, November 25 @10AM  BEFORE COVID TESTING at [place]  Open: 8am - 5pm (Lunch 12:30 - 1:30)   Phone: [phone]. You do not need to be fasting.  PLEASE COME TO [facility] [address] (THIS IS [facility]  12-30-2018 @ 11:15AM YOU WILL NEED TO SELF QUARANTINE AFTER COVID TESTING UNTIL YOU LEAVE FOR YOUR CARDIAC CATH. CANNOT LEAVE THE HOUSE AFTER ARRIVING HOME AFTER COVID TESTING.  4. Medication instructions in preparation for your procedure:  On the morning of your procedure, take your Aspirin and Plavix/Clopidogrel and any morning medicines NOT listed above.  You may use sips of water.  5. Plan for one night stay--bring personal belongings. 6. Bring a current list of your medications and current  insurance cards. 7. You MUST have a responsible person to drive you home. 8. Someone MUST be with you the first 24 hours after you arrive home or your discharge will be delayed. 9. Please wear clothes that are easy to get on and off and wear slip-on shoes.  Thank you for allowing [place] to care for you!   -- [facility]

## 2018-12-21 NOTE — Telephone Encounter (Signed)
°  Pt c/o BP issue: STAT if pt c/o blurred vision, one-sided weakness or slurred speech  1. What are your last 5 BP readings?  178/85 Pt does not take his BP regularly. Wife only took it today because he feels poorly  2. Are you having any other symptoms (ex. Dizziness, headache, blurred vision, passed out)? Dizzy sometimes, Pain in his neck/ back between his shoulders  3. What is your BP issue? Pt doesn't feel well and has a high BP. He has similar symptoms to when he had his heart attack before, and he would just like to get checked out     Pt c/o Shortness Of Breath: STAT if SOB developed within the last 24 hours or pt is noticeably SOB on the phone  1. Are you currently SOB (can you hear that pt is SOB on the phone)? no  2. How long have you been experiencing SOB? A few weeks   3. Are you SOB when sitting or when up moving around? movement  4. Are you currently experiencing any other symptoms? See above   Patient is hard of hearing. He will need to put his phone on speaker to hear, or have his wife do the talking

## 2018-12-21 NOTE — Telephone Encounter (Signed)
Called patient, he states that he has had issues with his BP increasing all day- it was recently 178/85, he is having shoulder blade pain, and neck pain- he denies chest pains, but does have excertional shortness of breath when doing any activities and is extremely fatigued when doing anything. He fears something is wrong with his valve replacements. Patient denies swelling- was offered 3:20 appointment with DOD  Dr.Kelly today- will route to covering nurse to make aware of situation.

## 2018-12-21 NOTE — H&P (View-Only) (Signed)
Patient ID: Nicholas Douglas, male   DOB: [birth year], 85 y.o.   MRN: [id number]     HPI: Nicholas Douglas is an 85 y.o. Caucasian male who presents to the office today as on add-on with a CC of progressive exertional dyspnea and upper back anginal equivalent discomfort.   Nicholas Douglas has known CAD and underwent numerous interventions dating back to 71, 1991, 1993, and in 1997. Cardiac catheterization in 2011 which showed preserved LV function with mild residual distal inferior apical hypocontractility. There is evidence for coronary calcification with segmental narrowing of his LAD of 30-40% proximally, 50% diffusely, in the midsegment, and 70-80% in the distal region, he had AV groove circumflex stenoses of 70 and 50% with a 40% OM 2 stenosis, and a 30-40 and 50% RCA stenoses with 80-90% stenosis in the acute marginal branch. He had been on medical therapy.  He  presented to [facility] in March 2015 with class IV angina and catheterization demonstrated severe 2 vessel CAD with significant current calcification of the LAD with up to 95% mid LAD stenosis and diffuse proximal stenosis. He underwent successful high-speed rotational atherectomy the following day by Dr. Martinique and a 1.5 mm bur was used. Mid LAD was stented with a 2.75x38 mm Promus stent in the proximal LAD was stented with a 3.0x28 mm Promus stent. Medical therapy was recommended for his distal LAD stenosis. He has only one kidney and staged intervention to the left circumflex coronary artery was recommended.  He underwent staged left circumflex PCI on 04/27/2013 by me with successful high-speed rotational atherectomy with a 1.5 and 1.75 mm burr, and ultimately had a 2.5x28 mm Promus premier DES stent inserted into the circumflex vessel, which was post dilated to approximately 2.6 mm.  Subsequently he has felt significantly improved with resolution of any chest pain  He  has a history of significant hyperlipidemia and has had significant increased  number of small LDL particles and insulin resistance. This seemed to markedly improve with the addition of Niaspan added to zetia and lovaza.  Remotely, he had been on statins consisting of Lipitor and subsequently Crestor. He did have transient LFT elevation. He also concerns of possible risk of developing dementia with statin therapy.  He had  some episodes of Niaspan induced diffuse flushing. He wanted to stop taking his Niaspan. He did have subsequent NMR off Niaspan and done just on Zetia and as well as 2 g of Lovaza.  This showed increased abnormalities such that his total cholesterol was 201, LDL had risen to 124, but he now had LDL small particles which have increased from 685 to 1348. Laboratory on 10/26/2012: LDL particle number was elevated at 1657, LDL cholesterol 112 triglycerides 155 and total cholesterol 190. He continued to be insulin resistant with insulin resistance scored 65. When I last saw him, we elected to try Livalo and ultimately titrated this to 4 mg daily to take in addition to his Zetia 10 mg. Laboratory on 02/22/2013: LDL particle number was  markedly improved at 774 with an HDL of 40 calculated LDL 50 small LDL particle #417. Insulin resistance score was also improved at 51.  In July 2015 he developed abdominal discomfort and was found to have acute appendicitis. On CT imaging he was also noted to have prostate enlargement with nodularity and thickening of the bladder base and cystoscopy was suggested for further evaluation. He is status post left nephrectomy. He also was noted to have a 2.3 cm infrarenal suprailiac  abdominal aortic aneurysm.  He has been on Livalo 2 mg, Zetia 10 mg for hyperlipidemia.  Lab work on 12/22/2013 showed a cholesterol 155, triglycerides 130, HDL 45, LDL 84.  He has been on amlodipine 10 mg atenolol 12.5 mg twice a day and valsartan 80 mg daily for blood pressure.  He continues to be on aspirin and Plavix for dual antiplatelet therapy.  A nuclear  perfusion study on 06/30/2014  revealed normal perfusion and function with an ejection fraction of 57%.    When he underwent a CT scan for his appendicitis he was told of having a small abdominal aortic aneurysm.  His mother also had an abdominal aortic aneurysm.  The patient recently sprained his right ankle.  As result, he has not been able to be as active as he had in the past and has not been able to play golf which typically he had played up to 4 times per week, often scoring in the 70s.    He was recently admitted to the hospital in April 2017 with complaints of increasing episodes of chest discomfort.  Troponins were mildly positive at 0.12, consistent with a non-STEMI.  I performed cardiac catheterization on 05/28/2015.  This showed widely patent stents in the LAD and circumflex vessels.  The RCA was diffusely diseased with calcification in the proximal to mid region with distal 99% stenosis and a noncalcified distal RCA segment immediately after the PDA takeoff.  ECI was difficult due to the calcified RCA preventing placement of a stent since the stent was never able to be passed beyond this calcified angled segment despite even attempting guide liner support.  Ultimately, he underwent successful PTCA of the 99% stenosis, which was reduced to 0%.  Since his intervention he had noticed dramatic improvement in his previous symptomatology.  He continues to feel well and is playing golf 3 times per week. He had a basal cell cancer removed from his nose.   When I saw him in April 2018 he was without chest pain or significant shortness of breath and was playing golf at least 3 days per week.  Laboratory showed a total cholesterol 147, triglycerides 151, HDL 43, LDL 74 on regimen of Zetia 10 mg and Livalo 2 mg.  In the past he's been intolerant to Crestor, Lipitor, and Zocor.  I further titrated Livalo 24 mg daily which  he has tolerated. An abdominal ultrasound showed aortoiliac atherosclerosis without  aneurysm.   When I saw him in June he had begun to notice increasing shortness of breath with walking. He denies any of the chest tightness or pressure that he had experienced prior to his interventions.  At his last catheterization, he had extensive calcification in his RCA and PTCA alone was done since the stent was unable to reach the subtotal distal RCA.  He denies recurrent chest tightness.  He was questioning whether or not he could have had  pneumonia comtributing to his shortness of breath.  At that evaluation, I further titrated his Imdur to 60 mg daily.  His blood pressure was also elevated and I titrated his ARB therapy.  He sagittally saw Caro Hight as an add-on in with complaints of increasing symptomatology definitive cardiac catheterization was recommended.  He underwent repeat cardiac catheterization on 08/05/2016 by Dr. Peter Martinique.  This continues to show patency of the stents in the proximal to mid LAD and patency of the stented the mid circumflex as well as patent PTCA site of the PL OM vessel.  There was no new disease to explain his symptoms.  He had normal LV function and normal left ventricular end-diastolic pressure.  Following the catheterization, he has felt well, but he continues to experience some shortness of breath with weakness and decreased energy.  He denies any chest tightness.  Recent laboratory done 10 days ago revealed an elevated potassium at 5.7.  On recheck 3 days later was 5.4.  Subsequent, he has been taken off ARB therapy.   In August 2018 he was complaining of significant fatigue.  His blood pressure was stable and his pulse was 55.  I recommended he reduce his atenolol from 12.5 mg twice a day to just 12.5 mg at bedtime.  I also reduced his isosorbide from 60 mg down to 30 mg in light of his recent catheterization findings.  His prior hypokalemia, resolved with discontinuance of ARB, but more importantly with discontinuance of his excessive exogenous  potassium intake with certain foods.   When I  saw him in October 2018 he was fairly stable. Over the winter months he has had difficulty with his back and required injections.  This has limited his exercise.  He admits to gaining weight.  He has noticed that he is more short of breath with walking up hills.  Blood pressures at home often been in the 140-150 range.  He denies any chest pressure.  He is unaware of palpitations.  He had repeat lab work one week ago.  Renal function was stable with a creatinine 1.15.  Potassium was 4.7.  LFTs were normal.  Hemoglobin and hematocrit were stable.  Lipid studies were excellent.   When I last saw him in April 2019 his blood pressure recordings at home were in the 130-1 50 range and he had experience some shortness of breath.  I suggested a trial of HCTZ 12.5 mg every other day.  He was tolerating livalo 4 mg, vascepa and Zetia for hyperlipidemia.  Over the past 6 months, he has been without recurrent anginal symptoms.  He continues to play golf at least 3 days/week and typically score is 75 or below.  He underwent repeat laboratory last week and lipid studies remain excellent with total cholesterol 124, LDL 57, triglycerides 82, HDL 51.  Shortness of breath has improved.  He denies any orthopnea.   I him in October 2019 at which time he was feeling well and his shortness of breath had improved.  His blood pressure was excellent on a regimen consisting of amlodipine 10 mg, atenolol 12.5 mg at bedtime and he had not had any recent swelling.  He was no longer taking HCTZ.  He was playing golf 3 days/week.  He was last evaluated in a telemedicine visit on Jun 17, 2018.  At that time he was remaining stable and denied any symptoms of chest pain PND orthopnea.  He is still playing golf at least 3 days/week and his highest score is 82 over the past year.  He does not routinely exercise on the days he does not play golf.  He states his blood pressures typically has been  running in the 235T with diastolics in the 61W to 43X.  He denied any palpitations, episodes of presyncope or syncope.   Recently, over the past 3 to 4 weeks he began to notice exertional dyspnea as well as some upper back discomfort to the past may have been his anginal equivalent.  His symptoms have progressed.  He denies any prolonged episodes of chest discomfort but  he now gets short of breath with significantly less activity.  He had called the office and is worked in today and seen as an add-on for further evaluation.  Past Medical History:  Diagnosis Date   BPH (benign prostatic hyperplasia)    Followed by [name]/urology   CAD S/P PTCA only of RPAV-PL 05/31/2015   99% --> 0%PAV - PTCA only (unable to advance STENT) due to RCA calcification - prox & distal RCA 30%; Patent LAD stent & Cx stent (~20% ISR).    Calculus of kidney    "that's how they found the cancer" (04/27/2013)   Coronary artery disease involving native coronary artery of native heart with unstable angina pectoris (Nicholas Douglas) 12/03/2011   S/P Cardiac angioplasty 1986, 1991, 1993, 1997.  Last cardiac catheterization 2001.  Cardiolite 05/2011 low risk with normal EF 65%.  Followed by cardiology/Kynzlie [facility] of [facility] every six months.    History of myocardial infarction 1976   Hypertension    Impotence of organic origin    Internal hemorrhoids without mention of complication    Malignant neoplasm of kidney, except pelvis    Melanoma ([place]) 06/2010   Left arm   Melanoma of back ([place]) 02/07/2015   R upper back; excision [facility].   Other abnormal blood chemistry    Other nonspecific abnormal serum enzyme levels    Personal history of colonic polyps    Pure hypercholesterolemia    Tobacco use disorder    Unspecified congenital cystic kidney disease    Unstable angina ([place]) 04/05/2013; 05/2015    Past Surgical History:  Procedure Laterality Date   APPENDECTOMY  2014   CARDIAC CATHETERIZATION N/A 05/30/2015   Procedure:  Left Heart Cath and Coronary Angiography;  Surgeon: [name], MD;  Location: [place] [facility];  Service: Cardiovascular;  Laterality: N/A;   CARDIAC CATHETERIZATION N/A 05/30/2015   Procedure: Coronary Balloon Angioplasty;  Surgeon: [name], MD;  Location: [place] [facility];  Service: Cardiovascular;  Laterality: N/A;   Cardiolite  05/06/2011   low risk study; normal EF.  [facility]   carotid dopplers  03/08/2011   minimal plaque formation B. Symptoms: dizziness.   COLONOSCOPY  10/05/2008   single polyp, [facility].  [name].  Repeat in 3 years.   CORONARY ANGIOPLASTY     "I've had 4" (04/27/2013)   CORONARY ANGIOPLASTY WITH STENT PLACEMENT  04/2013; 04/27/2013   "2 + 1" (04/27/2013)   EYE SURGERY  11/04/2013   Cataract surgery B; [facility].   LEFT HEART CATH AND CORONARY ANGIOGRAPHY N/A 08/05/2016   Procedure: Left Heart Cath and Coronary Angiography;  Surgeon: Martinique, Peter M, MD;  Location: [place] [facility];  Service: Cardiovascular;  Laterality: N/A;   MELANOMA EXCISION Left ~ 2007   "arm"   MELANOMA EXCISION  02/07/2015   R upper back. [facility]   NEPHRECTOMY Left 2006   PERCUTANEOUS CORONARY ROTOBLATOR INTERVENTION (PCI-R) N/A 04/06/2013   Procedure: PERCUTANEOUS CORONARY ROTOBLATOR INTERVENTION (PCI-R);  Surgeon: [name], MD;  Location: [facility] C[facility];  Service: Cardiovascular;  Laterality: N/A;   PERCUTANEOUS CORONARY STENT INTERVENTION (PCI-S) N/A 04/27/2013   Procedure: PERCUTANEOUS CORONARY STENT INTERVENTION (PCI-S);  Surgeon: [name], MD;  Location: [facility] CATH LAB;  Service: Cardiovascular;  Laterality: N/A;    Allergies  Allergen Reactions   Angiotensin Receptor Blockers     Other reaction(s): Kidney Disorder Hyperkalemia   Lovastatin Other (See Comments)    Elevated liver enzymes   Morphine Hives  Nifedipine Other (See Comments)    Elevated liver enzymes    Current Outpatient Medications  Medication Sig Dispense Refill   acetaminophen (TYLENOL) 500 MG  tablet Take 500 mg by mouth every 6 (six) hours as needed (for pain.).      amLODipine (NORVASC) 10 MG tablet TAKE 1 TABLET DAILY (Patient taking differently: Take 10 mg by mouth every evening. ) 90 tablet 0   aspirin 81 MG tablet Take 81 mg by mouth every evening.      atenolol (TENORMIN) 25 MG tablet Take 0.5 tablets (12.5 mg total) by mouth 2 (two) times daily. 90 tablet 3   Carboxymethylcell-Hypromellose (GENTEAL OP) Apply 1 drop to eye 2 (two) times daily.      clopidogrel (PLAVIX) 75 MG tablet TAKE 1 TABLET DAILY (Patient taking differently: Take 75 mg by mouth daily. ) 90 tablet 3   ezetimibe (ZETIA) 10 MG tablet Take 1 tablet (10 mg total) by mouth daily. 90 tablet 3   finasteride (PROSCAR) 5 MG tablet Take 1 tablet (5 mg total) by mouth daily. (Patient taking differently: Take 2.5 mg by mouth daily. ) 90 tablet 3   isosorbide mononitrate (IMDUR) 60 MG 24 hr tablet Take 1 tablet (60 mg total) by mouth daily. 30 tablet 6   nitroGLYCERIN (NITROSTAT) 0.4 MG SL tablet PLACE 1 TABLET (0.4 MG TOTAL) UNDER THE TONGUE EVERY 5 (FIVE) MINUTES AS NEEDED FOR CHEST PAIN. 25 tablet 11   Pitavastatin Calcium (LIVALO) 4 MG TABS Take 1 tablet (4 mg total) by mouth daily. (Patient taking differently: Take 4 mg by mouth every evening. ) 90 tablet 3   VASCEPA 1 g CAPS TAKE 1 CAPSULE TWICE A DAY (Patient taking differently: Take 1 g by mouth 2 (two) times daily. ) 180 capsule 0   No current facility-administered medications for this visit.     Socially he remains active. He is married has 5 children 10 grandchildren 2 great-grandchildren. Is no alcohol use. He typically scores below 75 and plays golf 3 days per week.  He typically scores in the 70s, better than his age.  ROS General: Negative; No fevers, chills, or night sweats;  Improved energy HEENT: Negative; No changes in vision or hearing, sinus congestion, difficulty swallowing Pulmonary: Positive for shortness of breath Cardiovascular: See  history of present illness;  GI: Negative; No nausea, vomiting, diarrhea, or abdominal pain GU: Negative; No dysuria, hematuria, or difficulty voiding Musculoskeletal: Negative; no myalgias, joint pain, or weakness Hematologic/Oncology: Negative; no easy bruising, bleeding Endocrine: Negative; no heat/cold intolerance; no diabetes Neuro: Negative; no changes in balance, headaches Skin: Negative; No rashes or skin lesions Psychiatric: Negative; No behavioral problems, depression Sleep: Negative; No snoring, daytime sleepiness, hypersomnolence, bruxism, restless legs, hypnogognic hallucinations, no cataplexy Other comprehensive 14 point system review is negative.   PE BP (!) 176/93    Pulse 77    Temp 97.9 F (36.6 C)    Ht _0  (1.702 m)    Wt 190 lb 6.4 oz (86.4 kg)    SpO2 92%    BMI 29.82 kg/m    Repeat blood pressure by me was 120/80 without orthostatic change  Wt Readings from Last 3 Encounters:  12/21/18 190 lb 6.4 oz (86.4 kg)  06/17/18 184 lb 8 oz (83.7 kg)  11/27/17 189 lb (85.7 kg)   General: Alert, oriented, no distress.  Skin: normal turgor, no rashes, warm and dry HEENT: Normocephalic, atraumatic. Pupils equal round and reactive to light; sclera anicteric; extraocular muscles  intact;  Nose without nasal septal hypertrophy Mouth/Parynx benign; Mallinpatti scale 3 Neck: No JVD, no carotid bruits; normal carotid upstroke Lungs: clear to ausculatation and percussion; no wheezing or rales Chest wall: without tenderness to palpitation Heart: PMI not displaced, RRR, s1 s2 normal, 1/6 systolic murmur, no diastolic murmur, no rubs, gallops, thrills, or heaves Abdomen: soft, nontender; no hepatosplenomehaly, BS+; abdominal aorta nontender and not dilated by palpation. Back: no CVA tenderness Pulses 2+ Musculoskeletal: full range of motion, normal strength, no joint deformities Extremities: no clubbing cyanosis or edema, Homan's sign negative  Neurologic: grossly nonfocal;  Cranial nerves grossly wnl Psychologic: Normal mood and affect   ECG (independently read by me): Normal sinus rhythm at 77 bpm.  Nonspecific ST changes.  Normal intervals  October 20119 ECG (independently read by me): Normal sinus rhythm at 61 bpm.  No ectopy.  Normal intervals.  April 2019 ECG (independently read by me):normal sinus rhythm at 61 bpm.  No ectopy.  No ST segment changes.  October 2018 ECG (independently read by me): Sinus bradycardia 57 bpm.  No ST segment changes.  Normal intervals.  August 2018 ECG (independently read by me): Sinus bradycardia 55 bpm.  Normal intervals.  No significant ST-T changes  June 2018 ECG (independently read by me): Sinus bradycardia 58 bpm.  Normal intervals.  No significant ST-T changes.  April 2018 ECG (independently read by me): Normal sinus rhythm at 63 bpm.  Normal intervals.  No ST segment changes.  September 2017 ECG (independently read by me): Normal sinus rhythm at 60 bpm.  Nonspecific T changes.  Intervals are normal.  May 2017 ECG (independently read by me): Sinus bradycardia 55 bpm.  No ectopy.  Normal intervals.  Nondiagnostic T changes in lead 3.  April 2017 ECG (independently read by me): Sinus bradycardia 55 bpm.  No ectopy.  No significant ST changes.  Normal intervals.  01/12/2015 ECG (independently read by me): Sinus bradycardia 54 bpm.  No ectopy.  Normal intervals.  June 2016 ECG (independently read by me): Sinus bradycardia 58 bpm.  Normal intervals.  No ectopy. Non-specific T change aVL  November 2015 ECG (independently read by me): Sinus bradycardia 54 bpm.  No significant ST-T changes.  Normal intervals.  May 2015 ECG (independently read by me): Sinus bradycardia 54 beats per minute.  No ectopy.  QTc interval 396 ms.  No significant ST changes.  04/22/2013 ECG (independently read by me): Sinus bradycardia 55 beats per minute. Nonspecific ST changes  03/01/2013 ECG (independently read by me): Sinus bradycardia 52  beats per minute. Normal intervals. No significant ST changes.  Prior ECG of 11/20/2012: Sinus rhythm at 51 beats per minute. QTc interval 400 ms. No significant ST changes.  LABS:  BMP Latest Ref Rng & Units 05/25/2018 11/18/2017 11/07/2017  Glucose 65 - 99 mg/dL 95 96 101(H)  BUN 8 - 27 mg/dL _0 Creatinine 0.76 - 1.27 mg/dL 1.24 1.05 1.08  BUN/Creat Ratio 10 - _1 Sodium 134 - 144 mmol/L 142 142 145(H)  Potassium 3.5 - 5.2 mmol/L 5.1 5.0 5.4(H)  Chloride 96 - 106 mmol/L 101 102 101  CO2 20 - 29 mmol/L _2 Calcium 8.6 - 10.2 mg/dL 9.8 9.5 10.1   Hepatic Function Latest Ref Rng & Units 05/25/2018 11/07/2017 05/15/2017  Total Protein 6.0 - 8.5 g/dL 7.7 7.8 7.1  Albumin 3.6 - 4.6 g/dL 4.6 4.7 4.5  AST 0 - 40 IU/L 36 40 36  ALT  0 - 44 IU/L 41 46(H) 34  Alk Phosphatase 39 - 117 IU/L 67 67 58  Total Bilirubin 0.0 - 1.2 mg/dL 0.9 1.1 0.9  Bilirubin, Direct 0.00 - 0.40 mg/dL - - -   CBC Latest Ref Rng & Units 05/25/2018 11/18/2017 11/07/2017  WBC 3.4 - 10.8 x10E3/uL 6.7 5.3 7.5  Hemoglobin 13.0 - 17.7 g/dL 16.5 15.1 16.3  Hematocrit 37.5 - 51.0 % 50.2 43.8 47.7  Platelets 150 - 450 x10E3/uL 261 226 297   Lab Results  Component Value Date   MCV 91 05/25/2018   MCV 87 11/18/2017   MCV 88 11/07/2017   Lab Results  Component Value Date   TSH 4.710 (H) 05/25/2018  .  Lipid Panel     Component Value Date/Time   CHOL 146 05/25/2018 0806   TRIG 109 05/25/2018 0806   TRIG 131 02/22/2013 0804   HDL 52 05/25/2018 0806   HDL 40 02/22/2013 0804   CHOLHDL 2.8 05/25/2018 0806   CHOLHDL 3.5 05/09/2015 1400   VLDL 31 (H) 05/09/2015 1400   LDLCALC 72 05/25/2018 0806   LDLCALC 50 02/22/2013 0804   RADIOLOGY: Dg Chest 2 View  08/17/2012   *RADIOLOGY REPORT*  Clinical Data: Renal cell carcinoma  CHEST - 2 VIEW  Comparison: 05/08/11  Findings: The cardiomediastinal silhouette is stable.  No acute infiltrate or pleural effusion.  No pulmonary edema.  Bony thorax is  unremarkable.  IMPRESSION: No active disease.  No significant change.   Original Report Authenticated By: Lahoma Crocker, M.D.    IMPRESSION:  1. DOE (dyspnea on exertion)   2. Angina pectoris ([place])   3. CAD in native artery: s/p multiple interventions   4. Essential hypertension, benign   5. Hyperlipidemia with target LDL less than 70   6. Atherosclerosis of aorta [facility])     ASSESSMENT AND PLAN: Mr. Eliga Arvie is a young appearing 85 year old gentleman who has CAD dating back to 43 and had undergone multiple interventions  over a10 year period from 41 to 1997. He developed unstable angina symptomatology in March 2015 and catheterization done initially at [facility] showed multivessel disease.  He underwent successful staged rotational coronary atherectomy initially involving the RCA in early March, and then on 04/27/2013 repeat intervention was done to the left circumflex system with rotational atherectomy.  At that time, he had a widely patent stent of his RCA, as well as a widely patent stent in his LAD.  He also had 60-70% mid LAD stenosis, which had not progressed. In 2017 he noticed a change in symptomatology with the development of more shortness of breath, and no energy and felt occasional episodes of vague chest discomfort with possible left arm radiation.  He was hospitalized in late April 2017 and was found to have progressive 99% distal RCA stenosis after the PDA takeoff.  His stents in the LAD and circumflex were widely patent.  There was mild concomitant CAD in the circumflex.  His RCA was diffusely diseased and calcified with narrowings of 30% of the mid segment, but there was an angulated area of narrowing calcified narrowing of 40% before the acute margin, which prevented a stent from getting beyond this.  He underwent successful PTCA  to the distal stenosis.  At his last catheterization in July 2018 his intervention sites were patent and there was no significant CAD progression. There was  mild 30-35% narrowing in the circumflex and mild 40% narrowing in the RCA.  The stents were patent as was the  prior PTCA site.  He has had issues with shortness of breath.  ARB therapy had to be discontinued secondary to hyperkalemia, but at the time he was also having a fair amount of exogenous potassium intake.  Over the past 3 to 4 weeks, he again has noted development of increasing exertional shortness of breath as well as exertionally precipitated upper back discomfort which he believes is his anginal equivalent.  His symptoms have occurred with less activity and are now at least class III symptomatology.  His ECG does not show any acute changes.  He has been on isosorbide 30 mg daily, amlodipine 10 mg and atenolol 12.5 mg daily.  I have recommended titration of isosorbide to 60 mg and further titration of atenolol to 12.5 mg twice a day.  I am scheduling him for an echo Doppler study to reassess LV function.  With his progressive symptomatology I have recommended repeat definitive cardiac catheterization.  He discussed the need for Covid testing.  If this can be arranged, catheterization will be tentatively scheduled for January 04, 2019.  He is aware of the risk benefits of the procedure having undergone multiple procedures over many years and wishes to pursue with definitive evaluation.  Time spent: 30 minutes [name], MD, [facility]  12/23/2018 5:59 PM

## 2018-12-21 NOTE — Progress Notes (Signed)
Patient ID: Nicholas Douglas, male   DOB: [birth year], 85 y.o.   MRN: [id number]     HPI: Nicholas Douglas is an 85 y.o. Caucasian male who presents to the office today as on add-on with a CC of progressive exertional dyspnea and upper back anginal equivalent discomfort.   Nicholas Douglas has known CAD and underwent numerous interventions dating back to 71, 1991, 1993, and in 1997. Cardiac catheterization in 2011 which showed preserved LV function with mild residual distal inferior apical hypocontractility. There is evidence for coronary calcification with segmental narrowing of his LAD of 30-40% proximally, 50% diffusely, in the midsegment, and 70-80% in the distal region, he had AV groove circumflex stenoses of 70 and 50% with a 40% OM 2 stenosis, and a 30-40 and 50% RCA stenoses with 80-90% stenosis in the acute marginal branch. He had been on medical therapy.  He  presented to [facility] in March 2015 with class IV angina and catheterization demonstrated severe 2 vessel CAD with significant current calcification of the LAD with up to 95% mid LAD stenosis and diffuse proximal stenosis. He underwent successful high-speed rotational atherectomy the following day by Dr. Martinique and a 1.5 mm bur was used. Mid LAD was stented with a 2.75x38 mm Promus stent in the proximal LAD was stented with a 3.0x28 mm Promus stent. Medical therapy was recommended for his distal LAD stenosis. He has only one kidney and staged intervention to the left circumflex coronary artery was recommended.  He underwent staged left circumflex PCI on 04/27/2013 by me with successful high-speed rotational atherectomy with a 1.5 and 1.75 mm burr, and ultimately had a 2.5x28 mm Promus premier DES stent inserted into the circumflex vessel, which was post dilated to approximately 2.6 mm.  Subsequently he has felt significantly improved with resolution of any chest pain  He  has a history of significant hyperlipidemia and has had significant increased  number of small LDL particles and insulin resistance. This seemed to markedly improve with the addition of Niaspan added to zetia and lovaza.  Remotely, he had been on statins consisting of Lipitor and subsequently Crestor. He did have transient LFT elevation. He also concerns of possible risk of developing dementia with statin therapy.  He had  some episodes of Niaspan induced diffuse flushing. He wanted to stop taking his Niaspan. He did have subsequent NMR off Niaspan and done just on Zetia and as well as 2 g of Lovaza.  This showed increased abnormalities such that his total cholesterol was 201, LDL had risen to 124, but he now had LDL small particles which have increased from 685 to 1348. Laboratory on 10/26/2012: LDL particle number was elevated at 1657, LDL cholesterol 112 triglycerides 155 and total cholesterol 190. He continued to be insulin resistant with insulin resistance scored 65. When I last saw him, we elected to try Livalo and ultimately titrated this to 4 mg daily to take in addition to his Zetia 10 mg. Laboratory on 02/22/2013: LDL particle number was  markedly improved at 774 with an HDL of 40 calculated LDL 50 small LDL particle #417. Insulin resistance score was also improved at 51.  In July 2015 he developed abdominal discomfort and was found to have acute appendicitis. On CT imaging he was also noted to have prostate enlargement with nodularity and thickening of the bladder base and cystoscopy was suggested for further evaluation. He is status post left nephrectomy. He also was noted to have a 2.3 cm infrarenal suprailiac  abdominal aortic aneurysm.  He has been on Livalo 2 mg, Zetia 10 mg for hyperlipidemia.  Lab work on 12/22/2013 showed a cholesterol 155, triglycerides 130, HDL 45, LDL 84.  He has been on amlodipine 10 mg atenolol 12.5 mg twice a day and valsartan 80 mg daily for blood pressure.  He continues to be on aspirin and Plavix for dual antiplatelet therapy.  A nuclear  perfusion study on 06/30/2014  revealed normal perfusion and function with an ejection fraction of 57%.    When he underwent a CT scan for his appendicitis he was told of having a small abdominal aortic aneurysm.  His mother also had an abdominal aortic aneurysm.  The patient recently sprained his right ankle.  As result, he has not been able to be as active as he had in the past and has not been able to play golf which typically he had played up to 4 times per week, often scoring in the 70s.    He was recently admitted to the hospital in April 2017 with complaints of increasing episodes of chest discomfort.  Troponins were mildly positive at 0.12, consistent with a non-STEMI.  I performed cardiac catheterization on 05/28/2015.  This showed widely patent stents in the LAD and circumflex vessels.  The RCA was diffusely diseased with calcification in the proximal to mid region with distal 99% stenosis and a noncalcified distal RCA segment immediately after the PDA takeoff.  ECI was difficult due to the calcified RCA preventing placement of a stent since the stent was never able to be passed beyond this calcified angled segment despite even attempting guide liner support.  Ultimately, he underwent successful PTCA of the 99% stenosis, which was reduced to 0%.  Since his intervention he had noticed dramatic improvement in his previous symptomatology.  He continues to feel well and is playing golf 3 times per week. He had a basal cell cancer removed from his nose.   When I saw him in April 2018 he was without chest pain or significant shortness of breath and was playing golf at least 3 days per week.  Laboratory showed a total cholesterol 147, triglycerides 151, HDL 43, LDL 74 on regimen of Zetia 10 mg and Livalo 2 mg.  In the past he's been intolerant to Crestor, Lipitor, and Zocor.  I further titrated Livalo 24 mg daily which  he has tolerated. An abdominal ultrasound showed aortoiliac atherosclerosis without  aneurysm.   When I saw him in June he had begun to notice increasing shortness of breath with walking. He denies any of the chest tightness or pressure that he had experienced prior to his interventions.  At his last catheterization, he had extensive calcification in his RCA and PTCA alone was done since the stent was unable to reach the subtotal distal RCA.  He denies recurrent chest tightness.  He was questioning whether or not he could have had  pneumonia comtributing to his shortness of breath.  At that evaluation, I further titrated his Imdur to 60 mg daily.  His blood pressure was also elevated and I titrated his ARB therapy.  He sagittally saw Nicholas Douglas as an add-on in with complaints of increasing symptomatology definitive cardiac catheterization was recommended.  He underwent repeat cardiac catheterization on 08/05/2016 by Dr. Peter Martinique.  This continues to show patency of the stents in the proximal to mid LAD and patency of the stented the mid circumflex as well as patent PTCA site of the PL OM vessel.  There was no new disease to explain his symptoms.  He had normal LV function and normal left ventricular end-diastolic pressure.  Following the catheterization, he has felt well, but he continues to experience some shortness of breath with weakness and decreased energy.  He denies any chest tightness.  Recent laboratory done 10 days ago revealed an elevated potassium at 5.7.  On recheck 3 days later was 5.4.  Subsequent, he has been taken off ARB therapy.   In August 2018 he was complaining of significant fatigue.  His blood pressure was stable and his pulse was 55.  I recommended he reduce his atenolol from 12.5 mg twice a day to just 12.5 mg at bedtime.  I also reduced his isosorbide from 60 mg down to 30 mg in light of his recent catheterization findings.  His prior hypokalemia, resolved with discontinuance of ARB, but more importantly with discontinuance of his excessive exogenous  potassium intake with certain foods.   When I  saw him in October 2018 he was fairly stable. Over the winter months he has had difficulty with his back and required injections.  This has limited his exercise.  He admits to gaining weight.  He has noticed that he is more short of breath with walking up hills.  Blood pressures at home often been in the 140-150 range.  He denies any chest pressure.  He is unaware of palpitations.  He had repeat lab work one week ago.  Renal function was stable with a creatinine 1.15.  Potassium was 4.7.  LFTs were normal.  Hemoglobin and hematocrit were stable.  Lipid studies were excellent.   When I last saw him in April 2019 his blood pressure recordings at home were in the 130-1 50 range and he had experience some shortness of breath.  I suggested a trial of HCTZ 12.5 mg every other day.  He was tolerating livalo 4 mg, vascepa and Zetia for hyperlipidemia.  Over the past 6 months, he has been without recurrent anginal symptoms.  He continues to play golf at least 3 days/week and typically score is 75 or below.  He underwent repeat laboratory last week and lipid studies remain excellent with total cholesterol 124, LDL 57, triglycerides 82, HDL 51.  Shortness of breath has improved.  He denies any orthopnea.   I him in October 2019 at which time he was feeling well and his shortness of breath had improved.  His blood pressure was excellent on a regimen consisting of amlodipine 10 mg, atenolol 12.5 mg at bedtime and he had not had any recent swelling.  He was no longer taking HCTZ.  He was playing golf 3 days/week.  He was last evaluated in a telemedicine visit on Jun 17, 2018.  At that time he was remaining stable and denied any symptoms of chest pain PND orthopnea.  He is still playing golf at least 3 days/week and his highest score is 82 over the past year.  He does not routinely exercise on the days he does not play golf.  He states his blood pressures typically has been  running in the 235T with diastolics in the 61W to 43X.  He denied any palpitations, episodes of presyncope or syncope.   Recently, over the past 3 to 4 weeks he began to notice exertional dyspnea as well as some upper back discomfort to the past may have been his anginal equivalent.  His symptoms have progressed.  He denies any prolonged episodes of chest discomfort but  he now gets short of breath with significantly less activity.  He had called the office and is worked in today and seen as an add-on for further evaluation.  Past Medical History:  Diagnosis Date   BPH (benign prostatic hyperplasia)    Followed by [name]/urology   CAD S/P PTCA only of RPAV-PL 05/31/2015   99% --> 0%PAV - PTCA only (unable to advance STENT) due to RCA calcification - prox & distal RCA 30%; Patent LAD stent & Cx stent (~20% ISR).    Calculus of kidney    "that's how they found the cancer" (04/27/2013)   Coronary artery disease involving native coronary artery of native heart with unstable angina pectoris ([place]) 12/03/2011   S/P Cardiac angioplasty 1986, 1991, 1993, 1997.  Last cardiac catheterization 2001.  Cardiolite 05/2011 low risk with normal EF 65%.  Followed by cardiology/Jak[facility] of [facility] every six months.    History of myocardial infarction 1976   Hypertension    Impotence of organic origin    Internal hemorrhoids without mention of complication    Malignant neoplasm of kidney, except pelvis    Melanoma ([place]) 06/2010   Left arm   Melanoma of back ([place]) 02/07/2015   R upper back; excision [facility].   Other abnormal blood chemistry    Other nonspecific abnormal serum enzyme levels    Personal history of colonic polyps    Pure hypercholesterolemia    Tobacco use disorder    Unspecified congenital cystic kidney disease    Unstable angina ([place]) 04/05/2013; 05/2015    Past Surgical History:  Procedure Laterality Date   APPENDECTOMY  2014   CARDIAC CATHETERIZATION N/A 05/30/2015   Procedure:  Left Heart Cath and Coronary Angiography;  Surgeon: [name], MD;  Location: [place] [facility];  Service: Cardiovascular;  Laterality: N/A;   CARDIAC CATHETERIZATION N/A 05/30/2015   Procedure: Coronary Balloon Angioplasty;  Surgeon: [name], MD;  Location: [place] [facility];  Service: Cardiovascular;  Laterality: N/A;   Cardiolite  05/06/2011   low risk study; normal EF.  [facility]   carotid dopplers  03/08/2011   minimal plaque formation B. Symptoms: dizziness.   COLONOSCOPY  10/05/2008   single polyp, [facility].  [name].  Repeat in 3 years.   CORONARY ANGIOPLASTY     "I've had 4" (04/27/2013)   CORONARY ANGIOPLASTY WITH STENT PLACEMENT  04/2013; 04/27/2013   "2 + 1" (04/27/2013)   EYE SURGERY  11/04/2013   Cataract surgery B; [facility].   LEFT HEART CATH AND CORONARY ANGIOGRAPHY N/A 08/05/2016   Procedure: Left Heart Cath and Coronary Angiography;  Surgeon: Martinique, Peter M, MD;  Location: [place] [facility];  Service: Cardiovascular;  Laterality: N/A;   MELANOMA EXCISION Left ~ 2007   "arm"   MELANOMA EXCISION  02/07/2015   R upper back. [facility]   NEPHRECTOMY Left 2006   PERCUTANEOUS CORONARY ROTOBLATOR INTERVENTION (PCI-R) N/A 04/06/2013   Procedure: PERCUTANEOUS CORONARY ROTOBLATOR INTERVENTION (PCI-R);  Surgeon: [name], MD;  Location: [facility];  Service: Cardiovascular;  Laterality: N/A;   PERCUTANEOUS CORONARY STENT INTERVENTION (PCI-S) N/A 04/27/2013   Procedure: PERCUTANEOUS CORONARY STENT INTERVENTION (PCI-S);  Surgeon: [name], MD;  Location: [facility] C[facility] LAB;  Service: Cardiovascular;  Laterality: N/A;    Allergies  Allergen Reactions   Angiotensin Receptor Blockers     Other reaction(s): Kidney Disorder Hyperkalemia   Lovastatin Other (See Comments)    Elevated liver enzymes   Morphine Hives  Nifedipine Other (See Comments)    Elevated liver enzymes    Current Outpatient Medications  Medication Sig Dispense Refill   acetaminophen (TYLENOL) 500 MG  tablet Take 500 mg by mouth every 6 (six) hours as needed (for pain.).      amLODipine (NORVASC) 10 MG tablet TAKE 1 TABLET DAILY (Patient taking differently: Take 10 mg by mouth every evening. ) 90 tablet 0   aspirin 81 MG tablet Take 81 mg by mouth every evening.      atenolol (TENORMIN) 25 MG tablet Take 0.5 tablets (12.5 mg total) by mouth 2 (two) times daily. 90 tablet 3   Carboxymethylcell-Hypromellose (GENTEAL OP) Apply 1 drop to eye 2 (two) times daily.      clopidogrel (PLAVIX) 75 MG tablet TAKE 1 TABLET DAILY (Patient taking differently: Take 75 mg by mouth daily. ) 90 tablet 3   ezetimibe (ZETIA) 10 MG tablet Take 1 tablet (10 mg total) by mouth daily. 90 tablet 3   finasteride (PROSCAR) 5 MG tablet Take 1 tablet (5 mg total) by mouth daily. (Patient taking differently: Take 2.5 mg by mouth daily. ) 90 tablet 3   isosorbide mononitrate (IMDUR) 60 MG 24 hr tablet Take 1 tablet (60 mg total) by mouth daily. 30 tablet 6   nitroGLYCERIN (NITROSTAT) 0.4 MG SL tablet PLACE 1 TABLET (0.4 MG TOTAL) UNDER THE TONGUE EVERY 5 (FIVE) MINUTES AS NEEDED FOR CHEST PAIN. 25 tablet 11   Pitavastatin Calcium (LIVALO) 4 MG TABS Take 1 tablet (4 mg total) by mouth daily. (Patient taking differently: Take 4 mg by mouth every evening. ) 90 tablet 3   VASCEPA 1 g CAPS TAKE 1 CAPSULE TWICE A DAY (Patient taking differently: Take 1 g by mouth 2 (two) times daily. ) 180 capsule 0   No current facility-administered medications for this visit.     Socially he remains active. He is married has 5 children 10 grandchildren 2 great-grandchildren. Is no alcohol use. He typically scores below 75 and plays golf 3 days per week.  He typically scores in the 70s, better than his age.  ROS General: Negative; No fevers, chills, or night sweats;  Improved energy HEENT: Negative; No changes in vision or hearing, sinus congestion, difficulty swallowing Pulmonary: Positive for shortness of breath Cardiovascular: See  history of present illness;  GI: Negative; No nausea, vomiting, diarrhea, or abdominal pain GU: Negative; No dysuria, hematuria, or difficulty voiding Musculoskeletal: Negative; no myalgias, joint pain, or weakness Hematologic/Oncology: Negative; no easy bruising, bleeding Endocrine: Negative; no heat/cold intolerance; no diabetes Neuro: Negative; no changes in balance, headaches Skin: Negative; No rashes or skin lesions Psychiatric: Negative; No behavioral problems, depression Sleep: Negative; No snoring, daytime sleepiness, hypersomnolence, bruxism, restless legs, hypnogognic hallucinations, no cataplexy Other comprehensive 14 point system review is negative.   PE BP (!) 176/93    Pulse 77    Temp 97.9 F (36.6 C)    Ht _0  (1.702 m)    Wt 190 lb 6.4 oz (86.4 kg)    SpO2 92%    BMI 29.82 kg/m    Repeat blood pressure by me was 120/80 without orthostatic change  Wt Readings from Last 3 Encounters:  12/21/18 190 lb 6.4 oz (86.4 kg)  06/17/18 184 lb 8 oz (83.7 kg)  11/27/17 189 lb (85.7 kg)   General: Alert, oriented, no distress.  Skin: normal turgor, no rashes, warm and dry HEENT: Normocephalic, atraumatic. Pupils equal round and reactive to light; sclera anicteric; extraocular muscles  intact;  Nose without nasal septal hypertrophy Mouth/Parynx benign; Mallinpatti scale 3 Neck: No JVD, no carotid bruits; normal carotid upstroke Lungs: clear to ausculatation and percussion; no wheezing or rales Chest wall: without tenderness to palpitation Heart: PMI not displaced, RRR, s1 s2 normal, 1/6 systolic murmur, no diastolic murmur, no rubs, gallops, thrills, or heaves Abdomen: soft, nontender; no hepatosplenomehaly, BS+; abdominal aorta nontender and not dilated by palpation. Back: no CVA tenderness Pulses 2+ Musculoskeletal: full range of motion, normal strength, no joint deformities Extremities: no clubbing cyanosis or edema, Homan's sign negative  Neurologic: grossly nonfocal;  Cranial nerves grossly wnl Psychologic: Normal mood and affect   ECG (independently read by me): Normal sinus rhythm at 77 bpm.  Nonspecific ST changes.  Normal intervals  October 20119 ECG (independently read by me): Normal sinus rhythm at 61 bpm.  No ectopy.  Normal intervals.  April 2019 ECG (independently read by me):normal sinus rhythm at 61 bpm.  No ectopy.  No ST segment changes.  October 2018 ECG (independently read by me): Sinus bradycardia 57 bpm.  No ST segment changes.  Normal intervals.  August 2018 ECG (independently read by me): Sinus bradycardia 55 bpm.  Normal intervals.  No significant ST-T changes  June 2018 ECG (independently read by me): Sinus bradycardia 58 bpm.  Normal intervals.  No significant ST-T changes.  April 2018 ECG (independently read by me): Normal sinus rhythm at 63 bpm.  Normal intervals.  No ST segment changes.  September 2017 ECG (independently read by me): Normal sinus rhythm at 60 bpm.  Nonspecific T changes.  Intervals are normal.  May 2017 ECG (independently read by me): Sinus bradycardia 55 bpm.  No ectopy.  Normal intervals.  Nondiagnostic T changes in lead 3.  April 2017 ECG (independently read by me): Sinus bradycardia 55 bpm.  No ectopy.  No significant ST changes.  Normal intervals.  01/12/2015 ECG (independently read by me): Sinus bradycardia 54 bpm.  No ectopy.  Normal intervals.  June 2016 ECG (independently read by me): Sinus bradycardia 58 bpm.  Normal intervals.  No ectopy. Non-specific T change aVL  November 2015 ECG (independently read by me): Sinus bradycardia 54 bpm.  No significant ST-T changes.  Normal intervals.  May 2015 ECG (independently read by me): Sinus bradycardia 54 beats per minute.  No ectopy.  QTc interval 396 ms.  No significant ST changes.  04/22/2013 ECG (independently read by me): Sinus bradycardia 55 beats per minute. Nonspecific ST changes  03/01/2013 ECG (independently read by me): Sinus bradycardia 52  beats per minute. Normal intervals. No significant ST changes.  Prior ECG of 11/20/2012: Sinus rhythm at 51 beats per minute. QTc interval 400 ms. No significant ST changes.  LABS:  BMP Latest Ref Rng & Units 05/25/2018 11/18/2017 11/07/2017  Glucose 65 - 99 mg/dL 95 96 101(H)  BUN 8 - 27 mg/dL _0 Creatinine 0.76 - 1.27 mg/dL 1.24 1.05 1.08  BUN/Creat Ratio 10 - _1 Sodium 134 - 144 mmol/L 142 142 145(H)  Potassium 3.5 - 5.2 mmol/L 5.1 5.0 5.4(H)  Chloride 96 - 106 mmol/L 101 102 101  CO2 20 - 29 mmol/L _2 Calcium 8.6 - 10.2 mg/dL 9.8 9.5 10.1   Hepatic Function Latest Ref Rng & Units 05/25/2018 11/07/2017 05/15/2017  Total Protein 6.0 - 8.5 g/dL 7.7 7.8 7.1  Albumin 3.6 - 4.6 g/dL 4.6 4.7 4.5  AST 0 - 40 IU/L 36 40 36  ALT  0 - 44 IU/L 41 46(H) 34  Alk Phosphatase 39 - 117 IU/L 67 67 58  Total Bilirubin 0.0 - 1.2 mg/dL 0.9 1.1 0.9  Bilirubin, Direct 0.00 - 0.40 mg/dL - - -   CBC Latest Ref Rng & Units 05/25/2018 11/18/2017 11/07/2017  WBC 3.4 - 10.8 x10E3/uL 6.7 5.3 7.5  Hemoglobin 13.0 - 17.7 g/dL 16.5 15.1 16.3  Hematocrit 37.5 - 51.0 % 50.2 43.8 47.7  Platelets 150 - 450 x10E3/uL 261 226 297   Lab Results  Component Value Date   MCV 91 05/25/2018   MCV 87 11/18/2017   MCV 88 11/07/2017   Lab Results  Component Value Date   TSH 4.710 (H) 05/25/2018  .  Lipid Panel     Component Value Date/Time   CHOL 146 05/25/2018 0806   TRIG 109 05/25/2018 0806   TRIG 131 02/22/2013 0804   HDL 52 05/25/2018 0806   HDL 40 02/22/2013 0804   CHOLHDL 2.8 05/25/2018 0806   CHOLHDL 3.5 05/09/2015 1400   VLDL 31 (H) 05/09/2015 1400   LDLCALC 72 05/25/2018 0806   LDLCALC 50 02/22/2013 0804   RADIOLOGY: Dg Chest 2 View  08/17/2012   *RADIOLOGY REPORT*  Clinical Data: Renal cell carcinoma  CHEST - 2 VIEW  Comparison: 05/08/11  Findings: The cardiomediastinal silhouette is stable.  No acute infiltrate or pleural effusion.  No pulmonary edema.  Bony thorax is  unremarkable.  IMPRESSION: No active disease.  No significant change.   Original Report Authenticated By: Lahoma Crocker, M.D.    IMPRESSION:  1. DOE (dyspnea on exertion)   2. Angina pectoris ([place])   3. CAD in native artery: s/p multiple interventions   4. Essential hypertension, benign   5. Hyperlipidemia with target LDL less than 70   6. Atherosclerosis of aorta [facility])     ASSESSMENT AND PLAN: Mr. Eliga Arvie is a young appearing 85 year old gentleman who has CAD dating back to 43 and had undergone multiple interventions  over a10 year period from 41 to 1997. He developed unstable angina symptomatology in March 2015 and catheterization done initially at [facility] showed multivessel disease.  He underwent successful staged rotational coronary atherectomy initially involving the RCA in early March, and then on 04/27/2013 repeat intervention was done to the left circumflex system with rotational atherectomy.  At that time, he had a widely patent stent of his RCA, as well as a widely patent stent in his LAD.  He also had 60-70% mid LAD stenosis, which had not progressed. In 2017 he noticed a change in symptomatology with the development of more shortness of breath, and no energy and felt occasional episodes of vague chest discomfort with possible left arm radiation.  He was hospitalized in late April 2017 and was found to have progressive 99% distal RCA stenosis after the PDA takeoff.  His stents in the LAD and circumflex were widely patent.  There was mild concomitant CAD in the circumflex.  His RCA was diffusely diseased and calcified with narrowings of 30% of the mid segment, but there was an angulated area of narrowing calcified narrowing of 40% before the acute margin, which prevented a stent from getting beyond this.  He underwent successful PTCA  to the distal stenosis.  At his last catheterization in July 2018 his intervention sites were patent and there was no significant CAD progression. There was  mild 30-35% narrowing in the circumflex and mild 40% narrowing in the RCA.  The stents were patent as was the  prior PTCA site.  He has had issues with shortness of breath.  ARB therapy had to be discontinued secondary to hyperkalemia, but at the time he was also having a fair amount of exogenous potassium intake.  Over the past 3 to 4 weeks, he again has noted development of increasing exertional shortness of breath as well as exertionally precipitated upper back discomfort which he believes is his anginal equivalent.  His symptoms have occurred with less activity and are now at least class III symptomatology.  His ECG does not show any acute changes.  He has been on isosorbide 30 mg daily, amlodipine 10 mg and atenolol 12.5 mg daily.  I have recommended titration of isosorbide to 60 mg and further titration of atenolol to 12.5 mg twice a day.  I am scheduling him for an echo Doppler study to reassess LV function.  With his progressive symptomatology I have recommended repeat definitive cardiac catheterization.  He discussed the need for Covid testing.  If this can be arranged, catheterization will be tentatively scheduled for January 04, 2019.  He is aware of the risk benefits of the procedure having undergone multiple procedures over many years and wishes to pursue with definitive evaluation.  Time spent: 30 minutes [name], MD, [facility]  12/23/2018 5:59 PM

## 2018-12-22 ENCOUNTER — Telehealth: Payer: Self-pay | Admitting: Cardiovascular Disease

## 2018-12-22 NOTE — Telephone Encounter (Signed)
Follow up:     Patient returning a call back. Please call patient. 

## 2018-12-22 NOTE — Telephone Encounter (Signed)
Returned call to patient spoke to wife she stated someone has already called husband this morning and scheduled echo on 12/30/18 at 7:15 am.

## 2018-12-23 ENCOUNTER — Encounter: Payer: Self-pay | Admitting: Cardiovascular Disease

## 2018-12-30 ENCOUNTER — Ambulatory Visit (HOSPITAL_BASED_OUTPATIENT_CLINIC_OR_DEPARTMENT_OTHER): Payer: Medicare Other

## 2018-12-30 ENCOUNTER — Other Ambulatory Visit (HOSPITAL_COMMUNITY): Payer: Medicare Other

## 2018-12-30 ENCOUNTER — Telehealth: Payer: Self-pay | Admitting: *Deleted

## 2018-12-30 ENCOUNTER — Other Ambulatory Visit: Payer: Self-pay

## 2018-12-30 ENCOUNTER — Other Ambulatory Visit (HOSPITAL_COMMUNITY)
Admission: RE | Admit: 2018-12-30 | Discharge: 2018-12-30 | Disposition: A | Discharge status: Home / Self Care | Payer: Medicare Other | Source: Ambulatory Visit | Attending: Cardiovascular Disease | Admitting: Cardiovascular Disease

## 2018-12-30 DIAGNOSIS — E785 Hyperlipidemia, unspecified: Secondary | ICD-10-CM | POA: Insufficient documentation

## 2018-12-30 DIAGNOSIS — Z87891 Personal history of nicotine dependence: Secondary | ICD-10-CM | POA: Insufficient documentation

## 2018-12-30 DIAGNOSIS — I252 Old myocardial infarction: Secondary | ICD-10-CM | POA: Insufficient documentation

## 2018-12-30 DIAGNOSIS — Z20828 Contact with and (suspected) exposure to other viral communicable diseases: Secondary | ICD-10-CM | POA: Diagnosis not present

## 2018-12-30 DIAGNOSIS — R0600 Dyspnea, unspecified: Secondary | ICD-10-CM | POA: Insufficient documentation

## 2018-12-30 DIAGNOSIS — I501 Left ventricular failure: Secondary | ICD-10-CM | POA: Insufficient documentation

## 2018-12-30 DIAGNOSIS — Z01818 Encounter for other preprocedural examination: Secondary | ICD-10-CM | POA: Diagnosis not present

## 2018-12-30 DIAGNOSIS — I358 Other nonrheumatic aortic valve disorders: Secondary | ICD-10-CM | POA: Insufficient documentation

## 2018-12-30 DIAGNOSIS — I110 Hypertensive heart disease with heart failure: Secondary | ICD-10-CM | POA: Diagnosis not present

## 2018-12-30 DIAGNOSIS — R0609 Other forms of dyspnea: Secondary | ICD-10-CM

## 2018-12-30 LAB — BASIC METABOLIC PANEL
BUN/Creatinine Ratio: 15 (ref 10–24)
BUN: 17 mg/dL (ref 8–27)
CO2: 26 mmol/L (ref 20–29)
Calcium: 10.1 mg/dL (ref 8.6–10.2)
Chloride: 103 mmol/L (ref 96–106)
Creatinine, Ser: 1.15 mg/dL (ref 0.76–1.27)
GFR calc Af Amer: 67 mL/min/{1.73_m2} (ref >59)
GFR calc non Af Amer: 58 mL/min/{1.73_m2} — ABNORMAL LOW (ref >59)
Glucose: 98 mg/dL (ref 65–99)
Potassium: 5.1 mmol/L (ref 3.5–5.2)
Sodium: 142 mmol/L (ref 134–144)

## 2018-12-30 LAB — CBC
Hematocrit: 49.1 % (ref 37.5–51.0)
Hemoglobin: 16.5 g/dL (ref 13.0–17.7)
MCH: 29.2 pg (ref 26.6–33.0)
MCHC: 33.6 g/dL (ref 31.5–35.7)
MCV: 87 fL (ref 79–97)
Platelets: 262 10*3/uL (ref 150–450)
RBC: 5.66 x10E6/uL (ref 4.14–5.80)
RDW: 12.5 % (ref 11.6–15.4)
WBC: 7 10*3/uL (ref 3.4–10.8)

## 2018-12-30 LAB — SARS CORONAVIRUS 2 (TAT 6-24 HRS): SARS Coronavirus 2: NEGATIVE

## 2018-12-30 MED ORDER — PERFLUTREN LIPID MICROSPHERE
1.0000 mL | INTRAVENOUS | Status: AC | PRN
  Administered 2018-12-30: 2 mL via INTRAVENOUS

## 2018-12-30 NOTE — Telephone Encounter (Signed)
Pt contacted pre-catheterization scheduled at [facility] for: Monday January 04, 2019 10:30 AM Verified arrival time and place: [facility]) at: 8:30 AM   No solid food after midnight prior to cath, clear liquids until 5 AM day of procedure. Contrast allergy: no   AM meds can be  taken pre-cath with sip of water including: ASA 81 mg Plavix 75 mg  Confirmed patient has responsible adult to drive home post procedure and observe 24 hours after arriving home: yes  Currently, due to Covid-19 pandemic, only one support person will be allowed with patient. Must be the same support person for that patient's entire stay, will be screened and required to wear a mask. They will be asked to wait in the waiting room for the duration of the patient's stay.  Patients are required to wear a mask when they enter the hospital.            COVID-19 Pre-Screening Questions:  . In the past 7 to 10 days have you had a cough,  shortness of breath, headache, fever (100 or greater) body aches, chills, sore throat, or sudden loss of taste or sense of smell? Shortness of breath-not new . Have you been around anyone with known Covid 19? no . Have you been around anyone who is awaiting Covid 19 test results in the past 7 to 10 days? no . Have you been around anyone who has been exposed to Covid 19, or has mentioned symptoms of Covid 19 within the past 7 to 10 days? no    I reviewed procedure/mask/visitor instructions with patient's wife (DPR), she verbalized understanding, thanked me for call. Pt's wife states patient is hard of hearing, she will accompany him to the hospital and can provide assistance if necessary.

## 2019-01-04 ENCOUNTER — Other Ambulatory Visit: Payer: Self-pay

## 2019-01-04 ENCOUNTER — Encounter (HOSPITAL_COMMUNITY)
Admission: RE | Disposition: A | Discharge status: Home / Self Care | Payer: Self-pay | Source: Home / Self Care | Attending: Cardiovascular Disease

## 2019-01-04 ENCOUNTER — Ambulatory Visit (HOSPITAL_COMMUNITY)
Admission: RE | Admit: 2019-01-04 | Discharge: 2019-01-04 | Disposition: A | Discharge status: Home / Self Care | Payer: Medicare Other | Attending: Cardiovascular Disease | Admitting: Cardiovascular Disease

## 2019-01-04 DIAGNOSIS — I2584 Coronary atherosclerosis due to calcified coronary lesion: Secondary | ICD-10-CM | POA: Insufficient documentation

## 2019-01-04 DIAGNOSIS — I714 Abdominal aortic aneurysm, without rupture: Secondary | ICD-10-CM | POA: Insufficient documentation

## 2019-01-04 DIAGNOSIS — E7800 Pure hypercholesterolemia, unspecified: Secondary | ICD-10-CM | POA: Insufficient documentation

## 2019-01-04 DIAGNOSIS — Z7982 Long term (current) use of aspirin: Secondary | ICD-10-CM | POA: Diagnosis not present

## 2019-01-04 DIAGNOSIS — N400 Enlarged prostate without lower urinary tract symptoms: Secondary | ICD-10-CM | POA: Insufficient documentation

## 2019-01-04 DIAGNOSIS — Z7902 Long term (current) use of antithrombotics/antiplatelets: Secondary | ICD-10-CM | POA: Diagnosis not present

## 2019-01-04 DIAGNOSIS — Z85528 Personal history of other malignant neoplasm of kidney: Secondary | ICD-10-CM | POA: Insufficient documentation

## 2019-01-04 DIAGNOSIS — E875 Hyperkalemia: Secondary | ICD-10-CM | POA: Diagnosis not present

## 2019-01-04 DIAGNOSIS — Z85820 Personal history of malignant melanoma of skin: Secondary | ICD-10-CM | POA: Diagnosis not present

## 2019-01-04 DIAGNOSIS — Z955 Presence of coronary angioplasty implant and graft: Secondary | ICD-10-CM | POA: Diagnosis not present

## 2019-01-04 DIAGNOSIS — I252 Old myocardial infarction: Secondary | ICD-10-CM | POA: Insufficient documentation

## 2019-01-04 DIAGNOSIS — R0609 Other forms of dyspnea: Secondary | ICD-10-CM | POA: Diagnosis not present

## 2019-01-04 DIAGNOSIS — E785 Hyperlipidemia, unspecified: Secondary | ICD-10-CM | POA: Diagnosis not present

## 2019-01-04 DIAGNOSIS — Z79899 Other long term (current) drug therapy: Secondary | ICD-10-CM | POA: Diagnosis not present

## 2019-01-04 DIAGNOSIS — Z885 Allergy status to narcotic agent status: Secondary | ICD-10-CM | POA: Insufficient documentation

## 2019-01-04 DIAGNOSIS — I25119 Atherosclerotic heart disease of native coronary artery with unspecified angina pectoris: Secondary | ICD-10-CM

## 2019-01-04 DIAGNOSIS — I10 Essential (primary) hypertension: Secondary | ICD-10-CM | POA: Insufficient documentation

## 2019-01-04 DIAGNOSIS — I700 Atherosclerosis of aorta: Secondary | ICD-10-CM | POA: Diagnosis not present

## 2019-01-04 HISTORY — PX: LEFT HEART CATH AND CORONARY ANGIOGRAPHY: CATH118249

## 2019-01-04 SURGERY — LEFT HEART CATH AND CORONARY ANGIOGRAPHY
Anesthesia: LOCAL

## 2019-01-04 MED ORDER — FENTANYL CITRATE (PF) 100 MCG/2ML IJ SOLN
INTRAMUSCULAR | Status: DC | PRN
  Administered 2019-01-04: 25 ug via INTRAVENOUS

## 2019-01-04 MED ORDER — SODIUM CHLORIDE 0.9 % WEIGHT BASED INFUSION
3.0000 mL/kg/h | INTRAVENOUS | Status: AC
  Administered 2019-01-04: 3 mL/kg/h via INTRAVENOUS

## 2019-01-04 MED ORDER — SODIUM CHLORIDE 0.9 % IV SOLN: INTRAVENOUS | Status: DC

## 2019-01-04 MED ORDER — HEPARIN (PORCINE) IN NACL 1000-0.9 UT/500ML-% IV SOLN
INTRAVENOUS | Status: AC
  Filled 2019-01-04: qty 1000

## 2019-01-04 MED ORDER — MIDAZOLAM HCL 2 MG/2ML IJ SOLN
INTRAMUSCULAR | Status: AC
  Filled 2019-01-04: qty 2

## 2019-01-04 MED ORDER — ASPIRIN 81 MG PO CHEW: 81.0000 mg | CHEWABLE_TABLET | Freq: Every day | ORAL | Status: DC

## 2019-01-04 MED ORDER — VERAPAMIL HCL 2.5 MG/ML IV SOLN
INTRAVENOUS | Status: DC | PRN
  Administered 2019-01-04: 10 mL via INTRA_ARTERIAL

## 2019-01-04 MED ORDER — VERAPAMIL HCL 2.5 MG/ML IV SOLN
INTRAVENOUS | Status: AC
  Filled 2019-01-04: qty 2

## 2019-01-04 MED ORDER — ONDANSETRON HCL 4 MG/2ML IJ SOLN: 4.0000 mg | Freq: Four times a day (QID) | INTRAMUSCULAR | Status: DC | PRN

## 2019-01-04 MED ORDER — MIDAZOLAM HCL 2 MG/2ML IJ SOLN
INTRAMUSCULAR | Status: DC | PRN
  Administered 2019-01-04: 2 mg via INTRAVENOUS

## 2019-01-04 MED ORDER — FENTANYL CITRATE (PF) 100 MCG/2ML IJ SOLN
INTRAMUSCULAR | Status: AC
  Filled 2019-01-04: qty 2

## 2019-01-04 MED ORDER — ASPIRIN 81 MG PO CHEW: 81.0000 mg | CHEWABLE_TABLET | ORAL | Status: DC

## 2019-01-04 MED ORDER — HEPARIN SODIUM (PORCINE) 1000 UNIT/ML IJ SOLN
INTRAMUSCULAR | Status: DC | PRN
  Administered 2019-01-04: 4300 [IU] via INTRAVENOUS

## 2019-01-04 MED ORDER — SODIUM CHLORIDE 0.9 % WEIGHT BASED INFUSION: 1.0000 mL/kg/h | INTRAVENOUS | Status: DC

## 2019-01-04 MED ORDER — HYDRALAZINE HCL 20 MG/ML IJ SOLN: 10.0000 mg | INTRAMUSCULAR | Status: DC | PRN

## 2019-01-04 MED ORDER — IOHEXOL 350 MG/ML SOLN
INTRAVENOUS | Status: DC | PRN
  Administered 2019-01-04: 70 mL via INTRA_ARTERIAL

## 2019-01-04 MED ORDER — HEPARIN (PORCINE) IN NACL 1000-0.9 UT/500ML-% IV SOLN
INTRAVENOUS | Status: DC | PRN
  Administered 2019-01-04 (×2): 500 mL

## 2019-01-04 MED ORDER — HEPARIN SODIUM (PORCINE) 1000 UNIT/ML IJ SOLN
INTRAMUSCULAR | Status: AC
  Filled 2019-01-04: qty 1

## 2019-01-04 MED ORDER — LIDOCAINE HCL (PF) 1 % IJ SOLN
INTRAMUSCULAR | Status: AC
  Filled 2019-01-04: qty 30

## 2019-01-04 MED ORDER — SODIUM CHLORIDE 0.9% FLUSH: 3.0000 mL | Freq: Two times a day (BID) | INTRAVENOUS | Status: DC

## 2019-01-04 MED ORDER — SODIUM CHLORIDE 0.9 % IV SOLN: 250.0000 mL | INTRAVENOUS | Status: DC | PRN

## 2019-01-04 MED ORDER — LABETALOL HCL 5 MG/ML IV SOLN: 10.0000 mg | INTRAVENOUS | Status: DC | PRN

## 2019-01-04 MED ORDER — SODIUM CHLORIDE 0.9% FLUSH: 3.0000 mL | INTRAVENOUS | Status: DC | PRN

## 2019-01-04 MED ORDER — DIAZEPAM 5 MG PO TABS: 5.0000 mg | ORAL_TABLET | Freq: Four times a day (QID) | ORAL | Status: DC | PRN

## 2019-01-04 MED ORDER — LIDOCAINE HCL (PF) 1 % IJ SOLN
INTRAMUSCULAR | Status: DC | PRN
  Administered 2019-01-04: 2 mL

## 2019-01-04 MED ORDER — ACETAMINOPHEN 325 MG PO TABS: 650.0000 mg | ORAL_TABLET | ORAL | Status: DC | PRN

## 2019-01-04 MED ORDER — CLOPIDOGREL BISULFATE 75 MG PO TABS: 75.0000 mg | ORAL_TABLET | ORAL | Status: DC

## 2019-01-04 MED ORDER — CLOPIDOGREL BISULFATE 75 MG PO TABS: 75.0000 mg | ORAL_TABLET | Freq: Every day | ORAL | Status: DC

## 2019-01-04 SURGICAL SUPPLY — 10 items
CATH INFINITI 5 FR JL3.5 (CATHETERS) ×2 IMPLANT
CATH OPTITORQUE TIG 4.0 5F (CATHETERS) ×2 IMPLANT
DEVICE RAD COMP TR BAND LRG (VASCULAR PRODUCTS) ×2 IMPLANT
GLIDESHEATH SLEND SS 6F .021 (SHEATH) ×2 IMPLANT
GUIDEWIRE INQWIRE 1.5J.035X260 (WIRE) ×1 IMPLANT
INQWIRE 1.5J .035X260CM (WIRE) ×2
KIT HEART LEFT (KITS) ×2 IMPLANT
PACK CARDIAC CATHETERIZATION (CUSTOM PROCEDURE TRAY) ×2 IMPLANT
TRANSDUCER W/STOPCOCK (MISCELLANEOUS) ×2 IMPLANT
TUBING CIL FLEX 10 FLL-RA (TUBING) ×2 IMPLANT

## 2019-01-04 NOTE — Progress Notes (Signed)
Per Dr [name] ok to d/c IV fluids when time to d/c home

## 2019-01-04 NOTE — Interval H&P Note (Signed)
Cath Lab Visit (complete for each Cath Lab visit)  Clinical Evaluation Leading to the Procedure:   ACS: No.  Non-ACS:    Anginal Classification: CCS III  Anti-ischemic medical therapy: Maximal Therapy (2 or more classes of medications)  Non-Invasive Test Results: No non-invasive testing performed  Prior CABG: No previous CABG      History and Physical Interval Note:  01/04/2019 11:02 AM  Nicholas Douglas  has presented today for surgery, with the diagnosis of doe.  The various methods of treatment have been discussed with the patient and family. After consideration of risks, benefits and other options for treatment, the patient has consented to  Procedure(s): LEFT HEART CATH AND CORONARY ANGIOGRAPHY (N/A) as a surgical intervention.  The patient's history has been reviewed, patient examined, no change in status, stable for surgery.  I have reviewed the patient's chart and labs.  Questions were answered to the patient's satisfaction.     [name]

## 2019-01-04 NOTE — Discharge Instructions (Signed)
Radial Site Care ° °This sheet gives you information about how to care for yourself after your procedure. Your health care provider may also give you more specific instructions. If you have problems or questions, contact your health care provider. °What can I expect after the procedure? °After the procedure, it is common to have: °· Bruising and tenderness at the catheter insertion area. °Follow these instructions at home: °Medicines °· Take over-the-counter and prescription medicines only as told by your health care provider. °Insertion site care °· Follow instructions from your health care provider about how to take care of your insertion site. Make sure you: °? Wash your hands with soap and water before you change your bandage (dressing). If soap and water are not available, use hand sanitizer. °? Change your dressing as told by your health care provider. °? Leave stitches (sutures), skin glue, or adhesive strips in place. These skin closures may need to stay in place for 2 weeks or longer. If adhesive strip edges start to loosen and curl up, you may trim the loose edges. Do not remove adhesive strips completely unless your health care provider tells you to do that. °· Check your insertion site every day for signs of infection. Check for: °? Redness, swelling, or pain. °? Fluid or blood. °? Pus or a bad smell. °? Warmth. °· Do not take baths, swim, or use a hot tub until your health care provider approves. °· You may shower 24-48 hours after the procedure, or as directed by your health care provider. °? Remove the dressing and gently wash the site with plain soap and water. °? Pat the area dry with a clean towel. °? Do not rub the site. That could cause bleeding. °· Do not apply powder or lotion to the site. °Activity ° °· For 24 hours after the procedure, or as directed by your health care provider: °? Do not flex or bend the affected arm. °? Do not push or pull heavy objects with the affected arm. °? Do not  drive yourself home from the hospital or clinic. You may drive 24 hours after the procedure unless your health care provider tells you not to. °? Do not operate machinery or power tools. °· Do not lift anything that is heavier than 10 lb (4.5 kg), or the limit that you are told, until your health care provider says that it is safe. °· Ask your health care provider when it is okay to: °? Return to work or school. °? Resume usual physical activities or sports. °? Resume sexual activity. °General instructions °· If the catheter site starts to bleed, raise your arm and put firm pressure on the site. If the bleeding does not stop, get help right away. This is a medical emergency. °· If you went home on the same day as your procedure, a responsible adult should be with you for the first 24 hours after you arrive home. °· Keep all follow-up visits as told by your health care provider. This is important. °Contact a health care provider if: °· You have a fever. °· You have redness, swelling, or yellow drainage around your insertion site. °Get help right away if: °· You have unusual pain at the radial site. °· The catheter insertion area swells very fast. °· The insertion area is bleeding, and the bleeding does not stop when you hold steady pressure on the area. °· Your arm or hand becomes pale, cool, tingly, or numb. °These symptoms may represent a serious problem   that is an emergency. Do not wait to see if the symptoms will go away. Get medical help right away. Call your local emergency services (911 in the [place] Do not drive yourself to the hospital. °Summary °· After the procedure, it is common to have bruising and tenderness at the site. °· Follow instructions from your health care provider about how to take care of your radial site wound. Check the wound every day for signs of infection. °· Do not lift anything that is heavier than 10 lb (4.5 kg), or the limit that you are told, until your health care provider says  that it is safe. °This information is not intended to replace advice given to you by your health care provider. Make sure you discuss any questions you have with your health care provider. °Document Released: 02/23/2010 Document Revised: 02/26/2017 Document Reviewed: 02/26/2017 °[employer]vier Patient Education © 2020 [employer] ° °

## 2019-01-05 ENCOUNTER — Encounter (HOSPITAL_COMMUNITY): Payer: Self-pay | Admitting: Cardiovascular Disease

## 2019-01-05 ENCOUNTER — Other Ambulatory Visit: Payer: Self-pay | Admitting: Cardiovascular Disease

## 2019-01-12 ENCOUNTER — Telehealth: Payer: Self-pay | Admitting: Cardiovascular Disease

## 2019-01-12 NOTE — Telephone Encounter (Signed)
Patients wife is calling requesting she attend appointment with him on 01/13/19 due to him not being able to hear well and being very forgetful. Please Advise.

## 2019-01-12 NOTE — Telephone Encounter (Signed)
Pt aware okay for wife to accompany pt to appt due to memory and HOH ./cy

## 2019-01-13 ENCOUNTER — Other Ambulatory Visit: Payer: Self-pay

## 2019-01-13 ENCOUNTER — Encounter: Payer: Self-pay | Admitting: Cardiovascular Disease

## 2019-01-13 ENCOUNTER — Ambulatory Visit: Payer: Medicare Other | Admitting: Cardiovascular Disease

## 2019-01-13 ENCOUNTER — Ambulatory Visit (INDEPENDENT_AMBULATORY_CARE_PROVIDER_SITE_OTHER): Payer: Medicare Other | Admitting: Cardiovascular Disease

## 2019-01-13 DIAGNOSIS — R0600 Dyspnea, unspecified: Secondary | ICD-10-CM

## 2019-01-13 DIAGNOSIS — I2510 Atherosclerotic heart disease of native coronary artery without angina pectoris: Secondary | ICD-10-CM | POA: Diagnosis not present

## 2019-01-13 DIAGNOSIS — E785 Hyperlipidemia, unspecified: Secondary | ICD-10-CM | POA: Diagnosis not present

## 2019-01-13 DIAGNOSIS — I10 Essential (primary) hypertension: Secondary | ICD-10-CM | POA: Diagnosis not present

## 2019-01-13 DIAGNOSIS — I519 Heart disease, unspecified: Secondary | ICD-10-CM

## 2019-01-13 DIAGNOSIS — I5189 Other ill-defined heart diseases: Secondary | ICD-10-CM

## 2019-01-13 DIAGNOSIS — R0609 Other forms of dyspnea: Secondary | ICD-10-CM

## 2019-01-13 MED ORDER — SPIRONOLACTONE 25 MG PO TABS: 12.5000 mg | ORAL_TABLET | Freq: Every day | ORAL | 3 refills | Status: DC

## 2019-01-13 NOTE — Progress Notes (Signed)
Patient ID: Nicholas Douglas, male   DOB: [birth year], 85 y.o.   MRN: [id number]     HPI: Nicholas Douglas is an 85 y.o. Caucasian male who presents to the office today in follow-up of his most recent cardiac catheterization on January 04, 2019.  Nicholas Douglas has known CAD and underwent numerous interventions dating back to 27, 1991, 1993, and in 1997. Cardiac catheterization in 2011 which showed preserved LV function with mild residual distal inferior apical hypocontractility. There is evidence for coronary calcification with segmental narrowing of his LAD of 30-40% proximally, 50% diffusely, in the midsegment, and 70-80% in the distal region, he had AV groove circumflex stenoses of 70 and 50% with a 40% OM 2 stenosis, and a 30-40 and 50% RCA stenoses with 80-90% stenosis in the acute marginal branch. He had been on medical therapy.  He  presented to [facility] in March 2015 with class IV angina and catheterization demonstrated severe 2 vessel CAD with significant current calcification of the LAD with up to 95% mid LAD stenosis and diffuse proximal stenosis. He underwent successful high-speed rotational atherectomy the following day by Dr. Martinique and a 1.5 mm bur was used. Mid LAD was stented with a 2.75x38 mm Promus stent in the proximal LAD was stented with a 3.0x28 mm Promus stent. Medical therapy was recommended for his distal LAD stenosis. He has only one kidney and staged intervention to the left circumflex coronary artery was recommended.  He underwent staged left circumflex PCI on 04/27/2013 by me with successful high-speed rotational atherectomy with a 1.5 and 1.75 mm burr, and ultimately had a 2.5x28 mm Promus premier DES stent inserted into the circumflex vessel, which was post dilated to approximately 2.6 mm.  Subsequently he has felt significantly improved with resolution of any chest pain  He  has a history of significant hyperlipidemia and has had significant increased number of small LDL  particles and insulin resistance. This seemed to markedly improve with the addition of Niaspan added to zetia and lovaza.  Remotely, he had been on statins consisting of Lipitor and subsequently Crestor. He did have transient LFT elevation. He also concerns of possible risk of developing dementia with statin therapy.  He had  some episodes of Niaspan induced diffuse flushing. He wanted to stop taking his Niaspan. He did have subsequent NMR off Niaspan and done just on Zetia and as well as 2 g of Lovaza.  This showed increased abnormalities such that his total cholesterol was 201, LDL had risen to 124, but he now had LDL small particles which have increased from 685 to 1348. Laboratory on 10/26/2012: LDL particle number was elevated at 1657, LDL cholesterol 112 triglycerides 155 and total cholesterol 190. He continued to be insulin resistant with insulin resistance scored 65. When I last saw him, we elected to try Livalo and ultimately titrated this to 4 mg daily to take in addition to his Zetia 10 mg. Laboratory on 02/22/2013: LDL particle number was  markedly improved at 774 with an HDL of 40 calculated LDL 50 small LDL particle #417. Insulin resistance score was also improved at 51.  In July 2015 he developed abdominal discomfort and was found to have acute appendicitis. On CT imaging he was also noted to have prostate enlargement with nodularity and thickening of the bladder base and cystoscopy was suggested for further evaluation. He is status post left nephrectomy. He also was noted to have a 2.3 cm infrarenal suprailiac abdominal aortic aneurysm.  He  has been on Livalo 2 mg, Zetia 10 mg for hyperlipidemia.  Lab work on 12/22/2013 showed a cholesterol 155, triglycerides 130, HDL 45, LDL 84.  He has been on amlodipine 10 mg atenolol 12.5 mg twice a day and valsartan 80 mg daily for blood pressure.  He continues to be on aspirin and Plavix for dual antiplatelet therapy.  A nuclear perfusion study on  06/30/2014  revealed normal perfusion and function with an ejection fraction of 57%.    When he underwent a CT scan for his appendicitis he was told of having a small abdominal aortic aneurysm.  His mother also had an abdominal aortic aneurysm.  The patient recently sprained his right ankle.  As result, he has not been able to be as active as he had in the past and has not been able to play golf which typically he had played up to 4 times per week, often scoring in the 70s.    He was recently admitted to the hospital in April 2017 with complaints of increasing episodes of chest discomfort.  Troponins were mildly positive at 0.12, consistent with a non-STEMI.  I performed cardiac catheterization on 05/28/2015.  This showed widely patent stents in the LAD and circumflex vessels.  The RCA was diffusely diseased with calcification in the proximal to mid region with distal 99% stenosis and a noncalcified distal RCA segment immediately after the PDA takeoff.  ECI was difficult due to the calcified RCA preventing placement of a stent since the stent was never able to be passed beyond this calcified angled segment despite even attempting guide liner support.  Ultimately, he underwent successful PTCA of the 99% stenosis, which was reduced to 0%.  Since his intervention he had noticed dramatic improvement in his previous symptomatology.  He continues to feel well and is playing golf 3 times per week. He had a basal cell cancer removed from his nose.   When I saw him in April 2018 he was without chest pain or significant shortness of breath and was playing golf at least 3 days per week.  Laboratory showed a total cholesterol 147, triglycerides 151, HDL 43, LDL 74 on regimen of Zetia 10 mg and Livalo 2 mg.  In the past he's been intolerant to Crestor, Lipitor, and Zocor.  I further titrated Livalo 24 mg daily which  he has tolerated. An abdominal ultrasound showed aortoiliac atherosclerosis without aneurysm.   When I  saw him in June he had begun to notice increasing shortness of breath with walking. He denies any of the chest tightness or pressure that he had experienced prior to his interventions.  At his last catheterization, he had extensive calcification in his RCA and PTCA alone was done since the stent was unable to reach the subtotal distal RCA.  He denies recurrent chest tightness.  He was questioning whether or not he could have had  pneumonia comtributing to his shortness of breath.  At that evaluation, I further titrated his Imdur to 60 mg daily.  His blood pressure was also elevated and I titrated his ARB therapy.  He sagittally saw Caro Hight as an add-on in with complaints of increasing symptomatology definitive cardiac catheterization was recommended.  He underwent repeat cardiac catheterization on 08/05/2016 by Dr. Peter Martinique.  This continues to show patency of the stents in the proximal to mid LAD and patency of the stented the mid circumflex as well as patent PTCA site of the PL OM vessel.  There was no new  disease to explain his symptoms.  He had normal LV function and normal left ventricular end-diastolic pressure.  Following the catheterization, he has felt well, but he continues to experience some shortness of breath with weakness and decreased energy.  He denies any chest tightness.  Recent laboratory done 10 days ago revealed an elevated potassium at 5.7.  On recheck 3 days later was 5.4.  Subsequent, he has been taken off ARB therapy.   In August 2018 he was complaining of significant fatigue.  His blood pressure was stable and his pulse was 55.  I recommended he reduce his atenolol from 12.5 mg twice a day to just 12.5 mg at bedtime.  I also reduced his isosorbide from 60 mg down to 30 mg in light of his recent catheterization findings.  His prior hypokalemia, resolved with discontinuance of ARB, but more importantly with discontinuance of his excessive exogenous potassium intake with certain  foods.   When I  saw him in October 2018 he was fairly stable. Over the winter months he has had difficulty with his back and required injections.  This has limited his exercise.  He admits to gaining weight.  He has noticed that he is more short of breath with walking up hills.  Blood pressures at home often been in the 140-150 range.  He denies any chest pressure.  He is unaware of palpitations.  He had repeat lab work one week ago.  Renal function was stable with a creatinine 1.15.  Potassium was 4.7.  LFTs were normal.  Hemoglobin and hematocrit were stable.  Lipid studies were excellent.   When I last saw him in April 2019 his blood pressure recordings at home were in the 130-1 50 range and he had experience some shortness of breath.  I suggested a trial of HCTZ 12.5 mg every other day.  He was tolerating livalo 4 mg, vascepa and Zetia for hyperlipidemia.  Over the past 6 months, he has been without recurrent anginal symptoms.  He continues to play golf at least 3 days/week and typically score is 75 or below.  He underwent repeat laboratory last week and lipid studies remain excellent with total cholesterol 124, LDL 57, triglycerides 82, HDL 51.  Shortness of breath has improved.  He denies any orthopnea.   When I saw him in October 2019 at which time he was feeling well and his shortness of breath had improved.  His blood pressure was excellent on a regimen consisting of amlodipine 10 mg, atenolol 12.5 mg at bedtime and he had not had any recent swelling.  He was no longer taking HCTZ.  He was playing golf 3 days/week.  He was evaluated in a telemedicine visit on Jun 17, 2018.  At that time he was remaining stable and denied any symptoms of chest pain PND orthopnea.  He is still playing golf at least 3 days/week and his highest score is 82 over the past year.  He does not routinely exercise on the days he does not play golf.  He states his blood pressures typically has been running in the 403K with  diastolics in the 74Q to 59D.  He denied any palpitations, episodes of presyncope or syncope.   I saw him as an add-on in the office on December 21, 2018 after he had begun to notice more exertional dyspnea as well as upper back discomfort which in the past may have been his anginal equivalent.  With progressive symptoms repeat echocardiography and definitive cardiac catheterization  was recommended.    An echo Doppler study on November 25 showed an EF of 55 to 60%.  There was mild aortic sclerosis.  Diastolic dysfunction was indeterminate.  Previously had been noted to have grade 1 diastolic dysfunction on prior echocardiography.  He underwent successful catheterization on January 04, 2019 which showed widely patent stents in the LAD and circumflex vessel.  The LAD had 40% stenosis in a small diagonal branch arising just distal to the stent in the mid LAD.  There was mild 20% mid LAD stenosis.  The circumflex vessel had smooth 20% narrowing proximal to the proximal stent.  There was mild 40 and 30% narrowings and small marginal branches.  The RCA had mild CAD of 20 and 30% with calcification and there was 70% stenosis in a small acute marginal branch ostially.  Medical therapy was recommended.  Subsequently, he has felt fairly well.  He continues to experience some shortness of breath with activity.  Past Medical History:  Diagnosis Date   BPH (benign prostatic hyperplasia)    Followed by [name]/urology   CAD S/P PTCA only of RPAV-PL 05/31/2015   99% --> 0%PAV - PTCA only (unable to advance STENT) due to RCA calcification - prox & distal RCA 30%; Patent LAD stent & Cx stent (~20% ISR).    Calculus of kidney    "that's how they found the cancer" (04/27/2013)   Coronary artery disease involving native coronary artery of native heart with unstable angina pectoris ([place]) 12/03/2011   S/P Cardiac angioplasty 1986, 1991, 1993, 1997.  Last cardiac catheterization 2001.  Cardiolite 05/2011 low risk  with normal EF 65%.  Followed by cardiology/[facility] of [facility] every six months.    History of myocardial infarction 1976   Hypertension    Impotence of organic origin    Internal hemorrhoids without mention of complication    Malignant neoplasm of kidney, except pelvis    Melanoma ([place]) 06/2010   Left arm   Melanoma of back ([place]) 02/07/2015   R upper back; excision [facility].   Other abnormal blood chemistry    Other nonspecific abnormal serum enzyme levels    Personal history of colonic polyps    Pure hypercholesterolemia    Tobacco use disorder    Unspecified congenital cystic kidney disease    Unstable angina ([place]) 04/05/2013; 05/2015    Past Surgical History:  Procedure Laterality Date   APPENDECTOMY  2014   CARDIAC CATHETERIZATION N/A 05/30/2015   Procedure: Left Heart Cath and Coronary Angiography;  Surgeon: [name], MD;  Location: [place] [facility];  Service: Cardiovascular;  Laterality: N/A;   CARDIAC CATHETERIZATION N/A 05/30/2015   Procedure: Coronary Balloon Angioplasty;  Surgeon: [name], MD;  Location: [place] [facility];  Service: Cardiovascular;  Laterality: N/A;   Cardiolite  05/06/2011   low risk study; normal EF.  [facility]   carotid dopplers  03/08/2011   minimal plaque formation B. Symptoms: dizziness.   COLONOSCOPY  10/05/2008   single polyp, [facility].  [name].  Repeat in 3 years.   CORONARY ANGIOPLASTY     "I've had 4" (04/27/2013)   CORONARY ANGIOPLASTY WITH STENT PLACEMENT  04/2013; 04/27/2013   "2 + 1" (04/27/2013)   EYE SURGERY  11/04/2013   Cataract surgery B; [facility].   LEFT HEART CATH AND CORONARY ANGIOGRAPHY N/A 08/05/2016   Procedure: Left Heart Cath and Coronary Angiography;  Surgeon: Martinique, Peter M, MD;  Location: [place] [facility];  Service: Cardiovascular;  Laterality: N/A;   LEFT HEART CATH AND CORONARY ANGIOGRAPHY N/A 01/04/2019   Procedure: LEFT HEART CATH AND CORONARY ANGIOGRAPHY;  Surgeon: [name], MD;  Location: [place] [facility];  Service: Cardiovascular;  Laterality: N/A;   MELANOMA EXCISION Left ~ 2007   "arm"   MELANOMA EXCISION  02/07/2015   R upper back. [facility]   NEPHRECTOMY Left 2006   PERCUTANEOUS CORONARY ROTOBLATOR INTERVENTION (PCI-R) N/A 04/06/2013   Procedure: PERCUTANEOUS CORONARY ROTOBLATOR INTERVENTION (PCI-R);  Surgeon: [name], MD;  Location: [facility] C[facility] LAB;  Service: Cardiovascular;  Laterality: N/A;   PERCUTANEOUS CORONARY STENT INTERVENTION (PCI-S) N/A 04/27/2013   Procedure: PERCUTANEOUS CORONARY STENT INTERVENTION (PCI-S);  Surgeon: [name], MD;  Location: [facility] CATH LAB;  Service: Cardiovascular;  Laterality: N/A;    Allergies  Allergen Reactions   Angiotensin Receptor Blockers     Other reaction(s): Kidney Disorder Hyperkalemia   Lovastatin Other (See Comments)    Elevated liver enzymes   Morphine Hives   Nifedipine Other (See Comments)    Elevated liver enzymes    Current Outpatient Medications  Medication Sig Dispense Refill   acetaminophen (TYLENOL) 500 MG tablet Take 500 mg by mouth every 6 (six) hours as needed (for pain.).      amLODipine (NORVASC) 10 MG tablet TAKE 1 TABLET DAILY (Patient taking differently: Take 10 mg by mouth every evening. ) 90 tablet 0   aspirin 81 MG tablet Take 81 mg by mouth every evening.      atenolol (TENORMIN) 25 MG tablet Take 0.5 tablets (12.5 mg total) by mouth 2 (two) times daily. 90 tablet 3   Carboxymethylcell-Hypromellose (GENTEAL OP) Apply 1 drop to eye 2 (two) times daily.      clopidogrel (PLAVIX) 75 MG tablet TAKE 1 TABLET DAILY (Patient taking differently: Take 75 mg by mouth daily. ) 90 tablet 3   ezetimibe (ZETIA) 10 MG tablet TAKE 1 TABLET DAILY 90 tablet 3   finasteride (PROSCAR) 5 MG tablet Take 1 tablet (5 mg total) by mouth daily. (Patient taking differently: Take 2.5 mg by mouth daily. ) 90 tablet 3   isosorbide mononitrate (IMDUR) 60 MG 24 hr tablet Take 1 tablet (60 mg total) by mouth daily. 30  tablet 6   nitroGLYCERIN (NITROSTAT) 0.4 MG SL tablet PLACE 1 TABLET (0.4 MG TOTAL) UNDER THE TONGUE EVERY 5 (FIVE) MINUTES AS NEEDED FOR CHEST PAIN. 25 tablet 11   Pitavastatin Calcium (LIVALO) 4 MG TABS Take 1 tablet (4 mg total) by mouth daily. (Patient taking differently: Take 4 mg by mouth every evening. ) 90 tablet 3   VASCEPA 1 g CAPS TAKE 1 CAPSULE TWICE A DAY (Patient taking differently: Take 1 g by mouth 2 (two) times daily. ) 180 capsule 0   spironolactone (ALDACTONE) 25 MG tablet Take 0.5 tablets (12.5 mg total) by mouth daily. 45 tablet 3   No current facility-administered medications for this visit.     Socially he remains active. He is married has 5 children 10 grandchildren 2 great-grandchildren. Is no alcohol use. He typically scores below 75 and plays golf 3 days per week.  He typically scores in the 70s, better than his age.  ROS General: Negative; No fevers, chills, or night sweats;  Improved energy HEENT: Negative; No changes in vision or hearing, sinus congestion, difficulty swallowing Pulmonary: Positive for shortness of breath Cardiovascular: See history of present illness;  GI: Negative; No nausea, vomiting, diarrhea, or abdominal pain GU: Negative; No dysuria,  hematuria, or difficulty voiding Musculoskeletal: Negative; no myalgias, joint pain, or weakness Hematologic/Oncology: Negative; no easy bruising, bleeding Endocrine: Negative; no heat/cold intolerance; no diabetes Neuro: Negative; no changes in balance, headaches Skin: Negative; No rashes or skin lesions Psychiatric: Negative; No behavioral problems, depression Sleep: Negative; No snoring, daytime sleepiness, hypersomnolence, bruxism, restless legs, hypnogognic hallucinations, no cataplexy Other comprehensive 14 point system review is negative.   PE BP (!) 142/76    Pulse (!) 54    Ht '5\' 7"'  (1.702 m)    Wt 192 lb (87.1 kg)    BMI 30.07 kg/m    Repeat blood pressure by me was 134/78.  Wt Readings  from Last 3 Encounters:  01/13/19 192 lb (87.1 kg)  01/04/19 190 lb (86.2 kg)  12/21/18 190 lb 6.4 oz (86.4 kg)   General: Alert, oriented, no distress.  Skin: normal turgor, no rashes, warm and dry HEENT: Normocephalic, atraumatic. Pupils equal round and reactive to light; sclera anicteric; extraocular muscles intact; Nose without nasal septal hypertrophy Mouth/Parynx benign; Mallinpatti scale 3 Neck: No JVD, no carotid bruits; normal carotid upstroke Lungs: clear to ausculatation and percussion; no wheezing or rales Chest wall: without tenderness to palpitation Heart: PMI not displaced, RRR, s1 s2 normal, 1/6 systolic murmur, no diastolic murmur, no rubs, gallops, thrills, or heaves Abdomen: soft, nontender; no hepatosplenomehaly, BS+; abdominal aorta nontender and not dilated by palpation. Back: no CVA tenderness Pulses 2+ Musculoskeletal: full range of motion, normal strength, no joint deformities Extremities: no clubbing cyanosis or edema, Homan's sign negative  Neurologic: grossly nonfocal; Cranial nerves grossly wnl Psychologic: Normal mood and affect   ECG (independently read by me): Sinus bradycardia 54 bpm.  No ectopy.  PR interval 192 ms, QTc interval 4 3 ms  December 21, 2018 ECG (independently read by me): Normal sinus rhythm at 77 bpm.  Nonspecific ST changes.  Normal intervals  October 20119 ECG (independently read by me): Normal sinus rhythm at 61 bpm.  No ectopy.  Normal intervals.  April 2019 ECG (independently read by me):normal sinus rhythm at 61 bpm.  No ectopy.  No ST segment changes.  October 2018 ECG (independently read by me): Sinus bradycardia 57 bpm.  No ST segment changes.  Normal intervals.  August 2018 ECG (independently read by me): Sinus bradycardia 55 bpm.  Normal intervals.  No significant ST-T changes  June 2018 ECG (independently read by me): Sinus bradycardia 58 bpm.  Normal intervals.  No significant ST-T changes.  April 2018 ECG  (independently read by me): Normal sinus rhythm at 63 bpm.  Normal intervals.  No ST segment changes.  September 2017 ECG (independently read by me): Normal sinus rhythm at 60 bpm.  Nonspecific T changes.  Intervals are normal.  May 2017 ECG (independently read by me): Sinus bradycardia 55 bpm.  No ectopy.  Normal intervals.  Nondiagnostic T changes in lead 3.  April 2017 ECG (independently read by me): Sinus bradycardia 55 bpm.  No ectopy.  No significant ST changes.  Normal intervals.  01/12/2015 ECG (independently read by me): Sinus bradycardia 54 bpm.  No ectopy.  Normal intervals.  June 2016 ECG (independently read by me): Sinus bradycardia 58 bpm.  Normal intervals.  No ectopy. Non-specific T change aVL  November 2015 ECG (independently read by me): Sinus bradycardia 54 bpm.  No significant ST-T changes.  Normal intervals.  May 2015 ECG (independently read by me): Sinus bradycardia 54 beats per minute.  No ectopy.  QTc interval 396 ms.  No  significant ST changes.  04/22/2013 ECG (independently read by me): Sinus bradycardia 55 beats per minute. Nonspecific ST changes  03/01/2013 ECG (independently read by me): Sinus bradycardia 52 beats per minute. Normal intervals. No significant ST changes.  Prior ECG of 11/20/2012: Sinus rhythm at 51 beats per minute. QTc interval 400 ms. No significant ST changes.  LABS:  BMP Latest Ref Rng & Units 12/30/2018 05/25/2018 11/18/2017  Glucose 65 - 99 mg/dL 98 95 96  BUN 8 - 27 mg/dL '17 17 10  ' Creatinine 0.76 - 1.27 mg/dL 1.15 1.24 1.05  BUN/Creat Ratio 10 - '24 15 14 10  ' Sodium 134 - 144 mmol/L 142 142 142  Potassium 3.5 - 5.2 mmol/L 5.1 5.1 5.0  Chloride 96 - 106 mmol/L 103 101 102  CO2 20 - 29 mmol/L '26 23 25  ' Calcium 8.6 - 10.2 mg/dL 10.1 9.8 9.5   Hepatic Function Latest Ref Rng & Units 05/25/2018 11/07/2017 05/15/2017  Total Protein 6.0 - 8.5 g/dL 7.7 7.8 7.1  Albumin 3.6 - 4.6 g/dL 4.6 4.7 4.5  AST 0 - 40 IU/L 36 40 36  ALT 0 - 44 IU/L  41 46(H) 34  Alk Phosphatase 39 - 117 IU/L 67 67 58  Total Bilirubin 0.0 - 1.2 mg/dL 0.9 1.1 0.9  Bilirubin, Direct 0.00 - 0.40 mg/dL - - -   CBC Latest Ref Rng & Units 12/30/2018 05/25/2018 11/18/2017  WBC 3.4 - 10.8 x10E3/uL 7.0 6.7 5.3  Hemoglobin 13.0 - 17.7 g/dL 16.5 16.5 15.1  Hematocrit 37.5 - 51.0 % 49.1 50.2 43.8  Platelets 150 - 450 x10E3/uL 262 261 226   Lab Results  Component Value Date   MCV 87 12/30/2018   MCV 91 05/25/2018   MCV 87 11/18/2017   Lab Results  Component Value Date   TSH 4.710 (H) 05/25/2018  .  Lipid Panel     Component Value Date/Time   CHOL 146 05/25/2018 0806   TRIG 109 05/25/2018 0806   TRIG 131 02/22/2013 0804   HDL 52 05/25/2018 0806   HDL 40 02/22/2013 0804   CHOLHDL 2.8 05/25/2018 0806   CHOLHDL 3.5 05/09/2015 1400   VLDL 31 (H) 05/09/2015 1400   LDLCALC 72 05/25/2018 0806   LDLCALC 50 02/22/2013 0804   RADIOLOGY: Dg Chest 2 View  08/17/2012   *RADIOLOGY REPORT*  Clinical Data: Renal cell carcinoma  CHEST - 2 VIEW  Comparison: 05/08/11  Findings: The cardiomediastinal silhouette is stable.  No acute infiltrate or pleural effusion.  No pulmonary edema.  Bony thorax is unremarkable.  IMPRESSION: No active disease.  No significant change.   Original Report Authenticated By: Lahoma Crocker, M.D.    CV STUDIES:  Echo 12/30/2018 Left ventricular ejection fraction, by visual estimation, is 55 to 60%. The left ventricle has normal function. There is no left ventricular hypertrophy. 2. Left ventricular diastolic parameters are indeterminate. 3. The left ventricle has no regional wall motion abnormalities. 4. Global right ventricle has normal systolic function.The right ventricular size is normal. No increase in right ventricular wall thickness. 5. Left atrial size was mildly dilated. 6. Right atrial size was normal. 7. The mitral valve is normal in structure. No evidence of mitral valve regurgitation. No evidence of mitral stenosis. 8. The  tricuspid valve is normal in structure. Tricuspid valve regurgitation is not demonstrated. 9. The aortic valve is normal in structure. Aortic valve regurgitation is trivial. Mild aortic valve sclerosis without stenosis. 10. The pulmonic valve was grossly normal. Pulmonic valve regurgitation  is not visualized. 11. The inferior vena cava is normal in size with greater than 50% respiratory variability, suggesting right atrial pressure of 3 mmHg.   CARDIAC CATHETERIZATION: 01/04/2019   Mid LM to Mid LAD lesion is 10% stenosed.  2nd Diag lesion is 40% stenosed.  Mid LAD to Dist LAD lesion is 20% stenosed.  Prox Cx to Mid Cx lesion is 5% stenosed.  Ost Cx to Prox Cx lesion is 20% stenosed.  1st Mrg lesion is 40% stenosed.  2nd Mrg lesion is 30% stenosed.  Ost RCA to Mid RCA lesion is 20% stenosed.  Acute Mrg lesion is 70% stenosed.  Dist RCA lesion is 30% stenosed.  RPAV lesion is 20% stenosed.  The left ventricular systolic function is normal.  LV end diastolic pressure is normal.  RPDA lesion is 40% stenosed.   Widely patent stents in the LAD and circumflex vessel.  The LAD has 40% stenosis in a small diagonal branch arising just distal to the stent in the mid LAD.  There is mild 20% mid LAD narrowing; complex vessel has smooth 20% narrowing proximal to the stented segment.  Small marginal branches arising within the circumflex stent had ostial narrowing of 40 and 30%; the  right coronary artery has moderate diffuse calcification with irregularity with narrowing of 20 to 30% in the mid vessel, 30% prior to PDA, and with no significant restenosis at the site of distal PTCA.  A small acute marginal branch has 70 to 80% ostial narrowing in a small caliber vessel.  LVEDP 17 - 23 MM hG.  RECOMMENDATION: The catheterization study is not significantly changed from the patient's last catheterization in July 2018.  Atherectomy sites with stenting in the LAD and circumflex vessel  remain widely patent.  There is mild small branch ostial narrowings.  The RCA is calcified with patent PTCA site.  Medical therapy will be continued.  Continue long-term DAPT as the patient has been on aspirin/Plavix.  Continue aggressive lipid-lowering therapy with target LDL less than 70.  Exercise prescription.   Intervention    IMPRESSION:  1. CAD in native artery   2. DOE (dyspnea on exertion)   3. Essential hypertension   4. Hyperlipidemia with target LDL less than 70   5. Grade I diastolic dysfunction     ASSESSMENT AND PLAN: Nicholas Douglas is a young appearing 85 year old gentleman who has CAD dating back to 8 and had undergone multiple interventions  over a10 year period from 5 to 1997. He developed unstable angina symptomatology in March 2015 and catheterization done initially at [facility] showed multivessel disease.  He underwent successful staged rotational coronary atherectomy initially involving the RCA in early March, and then on 04/27/2013 repeat intervention was done to the left circumflex system with rotational atherectomy.  At that time, he had a widely patent stent of his RCA, as well as a widely patent stent in his LAD.  He also had 60-70% mid LAD stenosis, which had not progressed. In 2017 he noticed a change in symptomatology with the development of more shortness of breath, and no energy and felt occasional episodes of vague chest discomfort with possible left arm radiation.  He was hospitalized in late April 2017 and was found to have progressive 99% distal RCA stenosis after the PDA takeoff.  His stents in the LAD and circumflex were widely patent.  There was mild concomitant CAD in the circumflex.  His RCA was diffusely diseased and calcified with narrowings of 30% of the  mid segment, but there was an angulated area of narrowing calcified narrowing of 40% before the acute margin, which prevented a stent from getting beyond this.  He underwent successful PTCA  to the  distal stenosis.  At his last catheterization in July 2018 his intervention sites were patent and there was no significant CAD progression. There was mild 30-35% narrowing in the circumflex and mild 40% narrowing in the RCA.  The stents were patent as was the prior PTCA site.  He has had issues with shortness of breath.  ARB therapy had to be discontinued secondary to hyperkalemia, but at the time he was also having a fair amount of exogenous potassium intake.  Because of progressive symptoms of increasing shortness of breath and upper back discomfort which he believed may have been his anginal equivalent definitive catheterization was performed as noted above.  I reviewed the findings again in detail both with he and his wife.  Both stents are patent.  The distal RCA PTCA site was patent.  He has not had any chest tightness or chest pressure.  He has continued to experience some mild shortness of breath.  With the COVID-19 pandemic he has not been able to exercise as much as he had in the past and certainly some of this may be a component of reduced aerobic capacity.  However, he has been previously noted to have diastolic dysfunction on echocardiography and is certainly possible that this could be contributory.  As a result, I have recommended initiation of very low-dose spironolactone 12.5 mg daily.  I cautioned him to avoid potassium containing foods.  He will start his first dose tomorrow.  We plan to obtain a BMP on Monday to make certain he is tolerating it from a renal standpoint and potassium level standpoint.  He continues to now walk up to 20 minutes on a treadmill.  I reviewed laboratory which was completed by his primary physician on January 11, 2019 which revealed a total cholesterol 144, triglycerides 103, HDL 53, and LDL 70.  Potassium was 4.5 BUN 20 creatinine 1.1.  LFTs were normal hemoglobin 16.1 hematocrit 49.3.  I will see him in 3 to 4 months for follow-up evaluation.   Time spent: 25  minutes [name], MD, [facility]  01/13/2019 3:12 PM

## 2019-01-13 NOTE — Patient Instructions (Signed)
Medication Instructions:  Your physician has recommended you make the following change in your medication:   START SPIRONOLACTONE 12.5 MG BY MOUTH DAILY  *If you need a refill on your cardiac medications before your next appointment, please call your pharmacy*  Lab Work: Your physician recommends that you have lab work at your nearest [place] 0000000: [place]  If you have labs (blood work) drawn today and your tests are completely normal, you will receive your results only by: Marland Kitchen MyChart Message (if you have MyChart) OR . A paper copy in the mail If you have any lab test that is abnormal or we need to change your treatment, we will call you to review the results.  Testing/Procedures: NONE  Follow-Up: At [facility], you and your health needs are our priority.  As part of our continuing mission to provide you with exceptional heart care, we have created designated Provider Care Teams.  These Care Teams include your primary Cardiologist (physician) and Advanced Practice Providers (APPs -  Physician Assistants and Nurse Practitioners) who all work together to provide you with the care you need, when you need it.  Your next appointment:   4 month(s)  The format for your next appointment:   Either In Person or Virtual  Provider:   [name], MD  Other Instructions

## 2019-01-20 ENCOUNTER — Other Ambulatory Visit: Payer: Self-pay | Admitting: Cardiovascular Disease

## 2019-01-20 LAB — BASIC METABOLIC PANEL
BUN/Creatinine Ratio: 14 (ref 10–24)
BUN: 19 mg/dL (ref 8–27)
CO2: 21 mmol/L (ref 20–29)
Calcium: 9.7 mg/dL (ref 8.6–10.2)
Chloride: 100 mmol/L (ref 96–106)
Creatinine, Ser: 1.32 mg/dL — ABNORMAL HIGH (ref 0.76–1.27)
GFR calc Af Amer: 56 mL/min/{1.73_m2} — ABNORMAL LOW (ref >59)
GFR calc non Af Amer: 49 mL/min/{1.73_m2} — ABNORMAL LOW (ref >59)
Glucose: 88 mg/dL (ref 65–99)
Potassium: 5.1 mmol/L (ref 3.5–5.2)
Sodium: 139 mmol/L (ref 134–144)

## 2019-01-25 ENCOUNTER — Other Ambulatory Visit: Payer: Self-pay

## 2019-01-25 MED ORDER — ATENOLOL 25 MG PO TABS: 12.5000 mg | ORAL_TABLET | Freq: Two times a day (BID) | ORAL | 3 refills | Status: DC

## 2019-02-01 ENCOUNTER — Other Ambulatory Visit: Payer: Self-pay

## 2019-02-01 MED ORDER — ICOSAPENT ETHYL 1 G PO CAPS: 1.0000 g | ORAL_CAPSULE | Freq: Two times a day (BID) | ORAL | 0 refills | Status: DC

## 2019-02-24 ENCOUNTER — Other Ambulatory Visit: Payer: Self-pay | Admitting: Cardiovascular Disease

## 2019-02-24 NOTE — Telephone Encounter (Signed)
New message   *STAT* If patient is at the pharmacy, call can be transferred to refill team.   1. Which medications need to be refilled? (please list name of each medication and dose if known) amLODipine (NORVASC) 10 MG tablet  2. Which pharmacy/location (including street and city if local pharmacy) is medication to be sent to? [place], [place]  3. Do they need a 30 day or 90 day supply? 90 day

## 2019-02-25 MED ORDER — AMLODIPINE BESYLATE 10 MG PO TABS: 10.0000 mg | ORAL_TABLET | Freq: Every evening | ORAL | 3 refills | Status: DC

## 2019-02-25 NOTE — Telephone Encounter (Signed)
Rx request sent to pharmacy.  

## 2019-03-16 ENCOUNTER — Other Ambulatory Visit: Payer: Self-pay | Admitting: *Deleted

## 2019-03-16 MED ORDER — ISOSORBIDE MONONITRATE ER 60 MG PO TB24: 60.0000 mg | ORAL_TABLET | Freq: Every day | ORAL | 2 refills | Status: DC

## 2019-03-16 MED ORDER — ICOSAPENT ETHYL 1 G PO CAPS: 1.0000 g | ORAL_CAPSULE | Freq: Two times a day (BID) | ORAL | 2 refills | Status: DC

## 2019-03-16 NOTE — Telephone Encounter (Signed)
Rx has been sent to the pharmacy electronically. ° °

## 2019-05-14 LAB — LIPID PANEL
Chol/HDL Ratio: 2.7 ratio (ref 0.0–5.0)
Cholesterol, Total: 132 mg/dL (ref 100–199)
HDL: 49 mg/dL (ref >39)
LDL Chol Calc (NIH): 62 mg/dL (ref 0–99)
Triglycerides: 116 mg/dL (ref 0–149)
VLDL Cholesterol Cal: 21 mg/dL (ref 5–40)

## 2019-05-14 LAB — CBC
Hematocrit: 45.9 % (ref 37.5–51.0)
Hemoglobin: 15.4 g/dL (ref 13.0–17.7)
MCH: 29.3 pg (ref 26.6–33.0)
MCHC: 33.6 g/dL (ref 31.5–35.7)
MCV: 87 fL (ref 79–97)
Platelets: 231 10*3/uL (ref 150–450)
RBC: 5.26 x10E6/uL (ref 4.14–5.80)
RDW: 12.3 % (ref 11.6–15.4)
WBC: 6.3 10*3/uL (ref 3.4–10.8)

## 2019-05-14 LAB — COMPREHENSIVE METABOLIC PANEL
ALT: 36 IU/L (ref 0–44)
AST: 35 IU/L (ref 0–40)
Albumin/Globulin Ratio: 1.4 (ref 1.2–2.2)
Albumin: 4.2 g/dL (ref 3.6–4.6)
Alkaline Phosphatase: 62 IU/L (ref 39–117)
BUN/Creatinine Ratio: 13 (ref 10–24)
BUN: 18 mg/dL (ref 8–27)
Bilirubin Total: 0.9 mg/dL (ref 0.0–1.2)
CO2: 25 mmol/L (ref 20–29)
Calcium: 9.7 mg/dL (ref 8.6–10.2)
Chloride: 108 mmol/L — ABNORMAL HIGH (ref 96–106)
Creatinine, Ser: 1.34 mg/dL — ABNORMAL HIGH (ref 0.76–1.27)
GFR calc Af Amer: 55 mL/min/{1.73_m2} — ABNORMAL LOW (ref >59)
GFR calc non Af Amer: 48 mL/min/{1.73_m2} — ABNORMAL LOW (ref >59)
Globulin, Total: 2.9 g/dL (ref 1.5–4.5)
Glucose: 110 mg/dL — ABNORMAL HIGH (ref 65–99)
Potassium: 5.2 mmol/L (ref 3.5–5.2)
Sodium: 146 mmol/L — ABNORMAL HIGH (ref 134–144)
Total Protein: 7.1 g/dL (ref 6.0–8.5)

## 2019-05-14 LAB — TSH: TSH: 2.31 u[IU]/mL (ref 0.450–4.500)

## 2019-05-21 ENCOUNTER — Ambulatory Visit (INDEPENDENT_AMBULATORY_CARE_PROVIDER_SITE_OTHER): Payer: Medicare Other | Admitting: Cardiovascular Disease

## 2019-05-21 ENCOUNTER — Encounter: Payer: Self-pay | Admitting: Cardiovascular Disease

## 2019-05-21 ENCOUNTER — Other Ambulatory Visit: Payer: Self-pay

## 2019-05-21 VITALS — BP 162/72 | HR 54 | Ht 67.0 in | Wt 199.4 lb

## 2019-05-21 DIAGNOSIS — I5189 Other ill-defined heart diseases: Secondary | ICD-10-CM

## 2019-05-21 DIAGNOSIS — I2510 Atherosclerotic heart disease of native coronary artery without angina pectoris: Secondary | ICD-10-CM

## 2019-05-21 DIAGNOSIS — I10 Essential (primary) hypertension: Secondary | ICD-10-CM

## 2019-05-21 DIAGNOSIS — I714 Abdominal aortic aneurysm, without rupture, unspecified: Secondary | ICD-10-CM

## 2019-05-21 DIAGNOSIS — R0600 Dyspnea, unspecified: Secondary | ICD-10-CM | POA: Diagnosis not present

## 2019-05-21 DIAGNOSIS — I519 Heart disease, unspecified: Secondary | ICD-10-CM

## 2019-05-21 DIAGNOSIS — R0609 Other forms of dyspnea: Secondary | ICD-10-CM

## 2019-05-21 DIAGNOSIS — E785 Hyperlipidemia, unspecified: Secondary | ICD-10-CM | POA: Diagnosis not present

## 2019-05-21 MED ORDER — SPIRONOLACTONE 25 MG PO TABS: 12.5000 mg | ORAL_TABLET | Freq: Every day | ORAL | 3 refills | Status: DC

## 2019-05-21 NOTE — Patient Instructions (Addendum)
Medication Instructions:  CONTINUE WITH CURRENT MEDICATIONS. NO CHANGES.  *If you need a refill on your cardiac medications before your next appointment, please call your pharmacy*   Lab Work: Prior to next office visit: fasting labs  cmet Cbc lipids  If you have labs (blood work) drawn today and your tests are completely normal, you will receive your results only by: Marland Kitchen MyChart Message (if you have MyChart) OR . A paper copy in the mail If you have any lab test that is abnormal or we need to change your treatment, we will call you to review the results.    Follow-Up: At [facility], you and your health needs are our priority.  As part of our continuing mission to provide you with exceptional heart care, we have created designated Provider Care Teams.  These Care Teams include your primary Cardiologist (physician) and Advanced Practice Providers (APPs -  Physician Assistants and Nurse Practitioners) who all work together to provide you with the care you need, when you need it.  We recommend signing up for the patient portal called "MyChart".  Sign up information is provided on this After Visit Summary.  MyChart is used to connect with patients for Virtual Visits (Telemedicine).  Patients are able to view lab/test results, encounter notes, upcoming appointments, etc.  Non-urgent messages can be sent to your provider as well.   To learn more about what you can do with MyChart, go to [email or web address].    Your next appointment:   6 month(s)  The format for your next appointment:   In Person  Provider:   [name], MD

## 2019-05-21 NOTE — Progress Notes (Signed)
Patient ID: Nicholas Douglas, male   DOB: 1931-11-13, 86 y.o.   MRN: [id number]     HPI: Nicholas Douglas is an 86 y.o. Caucasian male who presents to the office today for a 27-monthcardiology evaluation.  Nicholas Douglas known CAD and underwent numerous interventions dating back to 160 1991, 1993, and in 1997. Cardiac catheterization in 2011 which showed preserved LV function with mild residual distal inferior apical hypocontractility. There is evidence for coronary calcification with segmental narrowing of his LAD of 30-40% proximally, 50% diffusely, in the midsegment, and 70-80% in the distal region, he had AV groove circumflex stenoses of 70 and 50% with a 40% OM 2 stenosis, and a 30-40 and 50% RCA stenoses with 80-90% stenosis in the acute marginal branch. He had been on medical therapy.  [facility] and catheterization demonstrated severe 2 vessel CAD with significant current calcification of the LAD with up to 95% mid LAD stenosis and diffuse proximal stenosis. He underwent successful high-speed rotational atherectomy the following day by Dr. JMartiniqueand a 1.5 mm bur was used. Mid LAD was stented with a 2.75x38 mm Promus stent in the proximal LAD was stented with a 3.0x28 mm Promus stent. Medical therapy was recommended for his distal LAD stenosis. He has only one kidney and staged intervention to the left circumflex coronary artery was recommended.  He underwent staged left circumflex PCI on 04/27/2013 by me with successful high-speed rotational atherectomy with a 1.5 and 1.75 mm burr, and ultimately had a 2.5x28 mm Promus premier DES stent inserted into the circumflex vessel, which was post dilated to approximately 2.6 mm.  Subsequently he has felt significantly improved with resolution of any chest pain  He  has a history of significant hyperlipidemia and has had significant increased number of small LDL particles and insulin resistance. This seemed  to markedly improve with the addition of Niaspan added to zetia and lovaza.  Remotely, he had been on statins consisting of Lipitor and subsequently Crestor. He did have transient LFT elevation. He also concerns of possible risk of developing dementia with statin therapy.  He had  some episodes of Niaspan induced diffuse flushing. He wanted to stop taking his Niaspan. He did have subsequent NMR off Niaspan and done just on Zetia and as well as 2 g of Lovaza.  This showed increased abnormalities such that his total cholesterol was 201, LDL had risen to 124, but he now had LDL small particles which have increased from 685 to 1348. Laboratory on 10/26/2012: LDL particle number was elevated at 1657, LDL cholesterol 112 triglycerides 155 and total cholesterol 190. He continued to be insulin resistant with insulin resistance scored 65. When I last saw him, we elected to try Livalo and ultimately titrated this to 4 mg daily to take in addition to his Zetia 10 mg. Laboratory on 02/22/2013: LDL particle number was  markedly improved at 774 with an HDL of 40 calculated LDL 50 small LDL particle #417. Insulin resistance score was also improved at 51.  In July 2015 he developed abdominal discomfort and was found to have acute appendicitis. On CT imaging he was also noted to have prostate enlargement with nodularity and thickening of the bladder base and cystoscopy was suggested for further evaluation. He is status post left nephrectomy. He also was noted to have a 2.3 cm infrarenal suprailiac abdominal aortic aneurysm.  He has been on Livalo 2 mg, Zetia  10 mg for hyperlipidemia.  Lab work on 12/22/2013 showed a cholesterol 155, triglycerides 130, HDL 45, LDL 84.  He has been on amlodipine 10 mg atenolol 12.5 mg twice a day and valsartan 80 mg daily for blood pressure.  He continues to be on aspirin and Plavix for dual antiplatelet therapy.  A nuclear perfusion study on 06/30/2014  revealed normal perfusion and function with  an ejection fraction of 57%.    When he underwent a CT scan for his appendicitis he was told of having a small abdominal aortic aneurysm.  His mother also had an abdominal aortic aneurysm.  The patient recently sprained his right ankle.  As result, he has not been able to be as active as he had in the past and has not been able to play golf which typically he had played up to 4 times per week, often scoring in the 70s.    He was recently admitted to the hospital in April 2017 with complaints of increasing episodes of chest discomfort.  Troponins were mildly positive at 0.12, consistent with a non-STEMI.  I performed cardiac catheterization on 05/28/2015.  This showed widely patent stents in the LAD and circumflex vessels.  The RCA was diffusely diseased with calcification in the proximal to mid region with distal 99% stenosis and a noncalcified distal RCA segment immediately after the PDA takeoff.  ECI was difficult due to the calcified RCA preventing placement of a stent since the stent was never able to be passed beyond this calcified angled segment despite even attempting guide liner support.  Ultimately, he underwent successful PTCA of the 99% stenosis, which was reduced to 0%.  Since his intervention he had noticed dramatic improvement in his previous symptomatology.  He continues to feel well and is playing golf 3 times per week. He had a basal cell cancer removed from his nose.   When I saw him in April 2018 he was without chest pain or significant shortness of breath and was playing golf at least 3 days per week.  Laboratory showed a total cholesterol 147, triglycerides 151, HDL 43, LDL 74 on regimen of Zetia 10 mg and Livalo 2 mg.  In the past he's been intolerant to Crestor, Lipitor, and Zocor.  I further titrated Livalo 24 mg daily which  he has tolerated. An abdominal ultrasound showed aortoiliac atherosclerosis without aneurysm.   When I saw him in June 2018 he had begun to notice increasing  shortness of breath with walking. He denies any of the chest tightness or pressure that he had experienced prior to his interventions.  At his last catheterization, he had extensive calcification in his RCA and PTCA alone was done since the stent was unable to reach the subtotal distal RCA.  He denies recurrent chest tightness.  He was questioning whether or not he could have had  pneumonia comtributing to his shortness of breath.  At that evaluation, I further titrated his Imdur to 60 mg daily.  His blood pressure was also elevated and I titrated his ARB therapy.  He sagittally saw Nicholas Douglas as an add-on in with complaints of increasing symptomatology definitive cardiac catheterization was recommended.  He underwent repeat cardiac catheterization on 08/05/2016 by Dr. Peter Martinique.  This continues to show patency of the stents in the proximal to mid LAD and patency of the stented the mid circumflex as well as patent PTCA site of the PL OM vessel.  There was no new disease to explain his symptoms.  He had normal LV function and normal left ventricular end-diastolic pressure.  Following the catheterization, he has felt well, but he continues to experience some shortness of breath with weakness and decreased energy.  He denies any chest tightness.  Recent laboratory done 10 days ago revealed an elevated potassium at 5.7.  On recheck 3 days later was 5.4.  Subsequent, he has been taken off ARB therapy.    In August 2018 he was complaining of significant fatigue.  His blood pressure was stable and his pulse was 55.  I recommended he reduce his atenolol from 12.5 mg twice a day to just 12.5 mg at bedtime.  I also reduced his isosorbide from 60 mg down to 30 mg in light of his recent catheterization findings.  His prior hypokalemia, resolved with discontinuance of ARB, but more importantly with discontinuance of his excessive exogenous potassium intake with certain foods.   When I  saw him in October 2018 he  was fairly stable. Over the winter months he has had difficulty with his back and required injections.  This has limited his exercise.  He admits to gaining weight.  He has noticed that he is more short of breath with walking up hills.  Blood pressures at home often been in the 140-150 range.  He denies any chest pressure.  He is unaware of palpitations.  He had repeat lab work one week ago.  Renal function was stable with a creatinine 1.15.  Potassium was 4.7.  LFTs were normal.  Hemoglobin and hematocrit were stable.  Lipid studies were excellent.   When I saw him in April 2019 his blood pressure recordings at home were in the 130-150 range and he had experience some shortness of breath.  I suggested a trial of HCTZ 12.5 mg every other day.  He was tolerating livalo 4 mg, vascepa and Zetia for hyperlipidemia.  Over the past 6 months, he has been without recurrent anginal symptoms.  He continues to play golf at least 3 days/week and typically score is 75 or below.  He underwent repeat laboratory last week and lipid studies remain excellent with total cholesterol 124, LDL 57, triglycerides 82, HDL 51.  Shortness of breath has improved.  He denies any orthopnea.   When I saw him in October 2019 at which time he was feeling well and his shortness of breath had improved.  His blood pressure was excellent on a regimen consisting of amlodipine 10 mg, atenolol 12.5 mg at bedtime and he had not had any recent swelling.  He was no longer taking HCTZ.  He was playing golf 3 days/week.  He was evaluated in a telemedicine visit on Jun 17, 2018.  At that time he was remaining stable and denied any symptoms of chest pain PND orthopnea.  He is still playing golf at least 3 days/week and his highest score is 82 over the past year.  He does not routinely exercise on the days he does not play golf.  He states his blood pressures typically has been running in the 494W with diastolics in the 96P to 59F.  He denied any  palpitations, episodes of presyncope or syncope.   I saw him as an add-on in the office on December 21, 2018 after he had begun to notice more exertional dyspnea as well as upper back discomfort which in the past may have been his anginal equivalent.  With progressive symptoms repeat echocardiography and definitive cardiac catheterization was recommended.    An echo  Doppler study on December 30, 2018 showed an EF of 55 to 60%.  There was mild aortic sclerosis.  Diastolic dysfunction was indeterminate.  Previously had been noted to have grade 1 diastolic dysfunction on prior echocardiography.  He underwent successful catheterization on January 04, 2019 which showed widely patent stents in the LAD and circumflex vessel.  The LAD had 40% stenosis in a small diagonal branch arising just distal to the stent in the mid LAD.  There was mild 20% mid LAD stenosis.  The circumflex vessel had smooth 20% narrowing proximal to the proximal stent.  There was mild 40 and 30% narrowings and small marginal branches. The RCA had mild CAD of 20 and 30% with calcification and there was 70% stenosis in a small acute marginal branch ostially.  Medical therapy was recommended.  I saw him for initial follow-up of his cardiac catheterization on January 13, 2019 at which time he was doing well on medical therapy.  Over the past 5 months, he has continued to remain stable.  He specifically denies any chest pain.  He has been playing golf 3 times per week and still scores in the low to mid 70s.  He is unaware of palpitations.  He has continued to be on amlodipine 10 mg, isosorbide 60 mg a atenolol 12.5 mg twice a day in addition to spironolactone both for blood pressure and his CAD.  He continues to be on DAPT with aspirin/Plavix.  He tolerates Livalo in addition to [employer].  He presents for evaluation.  Past Medical History:  Diagnosis Date  . BPH (benign prostatic hyperplasia)    Followed by [name]  . CAD S/P PTCA  only of RPAV-PL 05/31/2015   99% --> 0%PAV - PTCA only (unable to advance STENT) due to RCA calcification - prox & distal RCA 30%; Patent LAD stent & Cx stent (~20% ISR).   . Calculus of kidney    "that's how they found the cancer" (04/27/2013)  . Coronary artery disease involving native coronary artery of native heart with unstable angina pectoris ([place]) 12/03/2011   S/P Cardiac angioplasty 1986, 1991, 1993, 1997.  Last cardiac catheterization 2001.  Cardiolite 05/2011 low risk with normal EF 65%.  Followed by [facility]ology/[facility] every six months.   . History of myocardial infarction 1976  . Hypertension   . Impotence of organic origin   . Internal hemorrhoids without mention of complication   . Malignant neoplasm of kidney, except pelvis   . Melanoma ([place]) 06/2010   Left arm  . 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  . Other abnormal blood chemistry   . Other nonspecific abnormal serum enzyme levels   . Personal history of colonic polyps   . Pure hypercholesterolemia   . Tobacco use disorder   . Unspecified congenital cystic kidney disease   . Unstable angina ([employer]) 04/05/2013; 05/2015    Past Surgical History:  Procedure Laterality Date  . APPENDECTOMY  2014  . CARDIAC CATHETERIZATION N/A 05/30/2015   Procedure: Left Heart Cath and Coronary Angiography;  Surgeon: [name], MD;  Location: [place] [facility];  Service: Cardiovascular;  Laterality: N/A;  . CARDIAC CATHETERIZATION N/A 05/30/2015   Procedure: Coronary Balloon Angioplasty;  Surgeon: [name], MD;  Location: [place] [facility];  Service: Cardiovascular;  Laterality: N/A;  . Cardiolite  05/06/2011   low risk study; normal EF.  [facility]  . carotid dopplers  03/08/2011   minimal  plaque formation B. Symptoms: dizziness.  . COLONOSCOPY  10/05/2008   single polyp, [facility].  [name].  Repeat in 3 years.  . CORONARY ANGIOPLASTY     "I've had 4" (04/27/2013)  . CORONARY ANGIOPLASTY WITH STENT PLACEMENT   04/2013; 04/27/2013   "2 + 1" (04/27/2013)  . EYE SURGERY  11/04/2013   Cataract surgery B; [facility].  Marland Kitchen LEFT HEART CATH AND CORONARY ANGIOGRAPHY N/A 08/05/2016   Procedure: Left Heart Cath and Coronary Angiography;  Surgeon: Martinique, Peter M, MD;  Location: [place] [facility];  Service: Cardiovascular;  Laterality: N/A;  . LEFT HEART CATH AND CORONARY ANGIOGRAPHY N/A 01/04/2019   Procedure: LEFT HEART CATH AND CORONARY ANGIOGRAPHY;  Surgeon: [name], MD;  Location: [place] [facility];  Service: Cardiovascular;  Laterality: N/A;  . MELANOMA EXCISION Left ~ 2007   "arm"  . MELANOMA EXCISION  02/07/2015   R upper back. [facility]  . NEPHRECTOMY Left 2006  . PERCUTANEOUS CORONARY ROTOBLATOR INTERVENTION (PCI-R) N/A 04/06/2013   Procedure: PERCUTANEOUS CORONARY ROTOBLATOR INTERVENTION (PCI-R);  Surgeon: [name], MD;  [facility];  Service: Cardiovascular;  Laterality: N/A;  . PERCUTANEOUS CORONARY STENT INTERVENTION (PCI-S) N/A 04/27/2013   Procedure: PERCUTANEOUS CORONARY STENT INTERVENTION (PCI-S);  Surgeon: [name], MD;  Location: [facility] CATH LAB;  Service: Cardiovascular;  Laterality: N/A;    Allergies  Allergen Reactions  . Angiotensin Receptor Blockers     Other reaction(s): Kidney Disorder Hyperkalemia  . Lovastatin Other (See Comments)    Elevated liver enzymes  . Morphine Hives  . Nifedipine Other (See Comments)    Elevated liver enzymes    Current Outpatient Medications  Medication Sig Dispense Refill  . acetaminophen (TYLENOL) 500 MG tablet Take 500 mg by mouth every 6 (six) hours as needed (for pain.).     Marland Kitchen amLODipine (NORVASC) 10 MG tablet Take 1 tablet (10 mg total) by mouth every evening. 90 tablet 3  . aspirin 81 MG tablet Take 81 mg by mouth every evening.     Marland Kitchen atenolol (TENORMIN) 25 MG tablet Take 0.5 tablets (12.5 mg total) by mouth 2 (two) times daily. 90 tablet 3  . Carboxymethylcell-Hypromellose (GENTEAL OP) Apply 1 drop to eye 2 (two) times daily.       . clopidogrel (PLAVIX) 75 MG tablet TAKE 1 TABLET DAILY (Patient taking differently: Take 75 mg by mouth daily. ) 90 tablet 3  . ezetimibe (ZETIA) 10 MG tablet TAKE 1 TABLET DAILY 90 tablet 3  . finasteride (PROSCAR) 5 MG tablet Take 1 tablet (5 mg total) by mouth daily. (Patient taking differently: Take 2.5 mg by mouth daily. ) 90 tablet 3  . icosapent Ethyl (VASCEPA) 1 g capsule Take 1 capsule (1 g total) by mouth 2 (two) times daily. 180 capsule 2  . isosorbide mononitrate (IMDUR) 60 MG 24 hr tablet Take 1 tablet (60 mg total) by mouth daily. 90 tablet 2  . nitroGLYCERIN (NITROSTAT) 0.4 MG SL tablet PLACE 1 TABLET (0.4 MG TOTAL) UNDER THE TONGUE EVERY 5 (FIVE) MINUTES AS NEEDED FOR CHEST PAIN. 25 tablet 11  . Pitavastatin Calcium (LIVALO) 4 MG TABS Take 1 tablet (4 mg total) by mouth daily. (Patient taking differently: Take 4 mg by mouth every evening. ) 90 tablet 3  . spironolactone (ALDACTONE) 25 MG tablet Take 0.5 tablets (12.5 mg total) by mouth daily. 45 tablet 3   No current facility-administered medications for this visit.    Socially he remains active. He  is married has 5 children 10 grandchildren 2 great-grandchildren. Is no alcohol use. He typically scores below 75 and plays golf 3 days per week.  He typically scores in the 70s, better than his age.  ROS General: Negative; No fevers, chills, or night sweats;  Improved energy HEENT: Negative; No changes in vision or hearing, sinus congestion, difficulty swallowing Pulmonary: Positive for shortness of breath Cardiovascular: See history of present illness;  GI: Negative; No nausea, vomiting, diarrhea, or abdominal pain GU: Negative; No dysuria, hematuria, or difficulty voiding Musculoskeletal: Negative; no myalgias, joint pain, or weakness Hematologic/Oncology: Negative; no easy bruising, bleeding Endocrine: Negative; no heat/cold intolerance; no diabetes Neuro: Negative; no changes in balance, headaches Skin: Negative; No rashes  or skin lesions Psychiatric: Negative; No behavioral problems, depression Sleep: Negative; No snoring, daytime sleepiness, hypersomnolence, bruxism, restless legs, hypnogognic hallucinations, no cataplexy Other comprehensive 14 point system review is negative.   PE BP (!) 162/72   Pulse (!) 54   Ht _0  (1.702 m)   Wt 199 lb 6.4 oz (90.4 kg)   BMI 31.23 kg/m    Repeat blood pressure by me was 114/70 supine and 116/70 standing  Wt Readings from Last 3 Encounters:  05/21/19 199 lb 6.4 oz (90.4 kg)  01/13/19 192 lb (87.1 kg)  01/04/19 190 lb (86.2 kg)   General: Alert, oriented, no distress.  Skin: normal turgor, no rashes, warm and dry HEENT: Normocephalic, atraumatic. Pupils equal round and reactive to light; sclera anicteric; extraocular muscles intact;  Nose without nasal septal hypertrophy Mouth/Parynx benign; Mallinpatti scale 3 Neck: No JVD, no carotid bruits; normal carotid upstroke Lungs: clear to ausculatation and percussion; no wheezing or rales Chest wall: without tenderness to palpitation Heart: PMI not displaced, RRR, s1 s2 normal, 1/6 systolic murmur, no diastolic murmur, no rubs, gallops, thrills, or heaves Abdomen: soft, nontender; no hepatosplenomehaly, BS+; abdominal aorta nontender and not dilated by palpation. Back: no CVA tenderness Pulses 2+ Musculoskeletal: full range of motion, normal strength, no joint deformities Extremities: no clubbing cyanosis or edema, Homan's sign negative  Neurologic: grossly nonfocal; Cranial nerves grossly wnl Psychologic: Normal mood and affect   ECG (independently read by me): Sinus bradycardia 54 bpm.  PR interval 188 ms, QTc interval 421 ms.  No ectopy.  January 13, 2019 ECG (independently read by me): Sinus bradycardia 54 bpm.  No ectopy.  PR interval 192 ms, QTc interval 4 3 ms  December 21, 2018 ECG (independently read by me): Normal sinus rhythm at 77 bpm.  Nonspecific ST changes.  Normal intervals  October 20119  ECG (independently read by me): Normal sinus rhythm at 61 bpm.  No ectopy.  Normal intervals.  April 2019 ECG (independently read by me):normal sinus rhythm at 61 bpm.  No ectopy.  No ST segment changes.  October 2018 ECG (independently read by me): Sinus bradycardia 57 bpm.  No ST segment changes.  Normal intervals.  August 2018 ECG (independently read by me): Sinus bradycardia 55 bpm.  Normal intervals.  No significant ST-T changes  June 2018 ECG (independently read by me): Sinus bradycardia 58 bpm.  Normal intervals.  No significant ST-T changes.  April 2018 ECG (independently read by me): Normal sinus rhythm at 63 bpm.  Normal intervals.  No ST segment changes.  September 2017 ECG (independently read by me): Normal sinus rhythm at 60 bpm.  Nonspecific T changes.  Intervals are normal.  May 2017 ECG (independently read by me): Sinus bradycardia 55 bpm.  No ectopy.  Normal intervals.  Nondiagnostic T changes in lead 3.  April 2017 ECG (independently read by me): Sinus bradycardia 55 bpm.  No ectopy.  No significant ST changes.  Normal intervals.  01/12/2015 ECG (independently read by me): Sinus bradycardia 54 bpm.  No ectopy.  Normal intervals.  June 2016 ECG (independently read by me): Sinus bradycardia 58 bpm.  Normal intervals.  No ectopy. Non-specific T change aVL  November 2015 ECG (independently read by me): Sinus bradycardia 54 bpm.  No significant ST-T changes.  Normal intervals.  May 2015 ECG (independently read by me): Sinus bradycardia 54 beats per minute.  No ectopy.  QTc interval 396 ms.  No significant ST changes.  04/22/2013 ECG (independently read by me): Sinus bradycardia 55 beats per minute. Nonspecific ST changes  03/01/2013 ECG (independently read by me): Sinus bradycardia 52 beats per minute. Normal intervals. No significant ST changes.  Prior ECG of 11/20/2012: Sinus rhythm at 51 beats per minute. QTc interval 400 ms. No significant ST changes.  LABS:  BMP  Latest Ref Rng & Units 05/13/2019 01/19/2019 12/30/2018  Glucose 65 - 99 mg/dL 110(H) 88 98  BUN 8 - 27 mg/dL _0 Creatinine 0.76 - 1.27 mg/dL 1.34(H) 1.32(H) 1.15  BUN/Creat Ratio 10 - _1 Sodium 134 - 144 mmol/L 146(H) 139 142  Potassium 3.5 - 5.2 mmol/L 5.2 5.1 5.1  Chloride 96 - 106 mmol/L 108(H) 100 103  CO2 20 - 29 mmol/L _2 Calcium 8.6 - 10.2 mg/dL 9.7 9.7 10.1   Hepatic Function Latest Ref Rng & Units 05/13/2019 05/25/2018 11/07/2017  Total Protein 6.0 - 8.5 g/dL 7.1 7.7 7.8  Albumin 3.6 - 4.6 g/dL 4.2 4.6 4.7  AST 0 - 40 IU/L 35 36 40  ALT 0 - 44 IU/L 36 41 46(H)  Alk Phosphatase 39 - 117 IU/L 62 67 67  Total Bilirubin 0.0 - 1.2 mg/dL 0.9 0.9 1.1  Bilirubin, Direct 0.00 - 0.40 mg/dL - - -   CBC Latest Ref Rng & Units 05/13/2019 12/30/2018 05/25/2018  WBC 3.4 - 10.8 x10E3/uL 6.3 7.0 6.7  Hemoglobin 13.0 - 17.7 g/dL 15.4 16.5 16.5  Hematocrit 37.5 - 51.0 % 45.9 49.1 50.2  Platelets 150 - 450 x10E3/uL 231 262 261   Lab Results  Component Value Date   MCV 87 05/13/2019   MCV 87 12/30/2018   MCV 91 05/25/2018   Lab Results  Component Value Date   TSH 2.310 05/13/2019  .  Lipid Panel     Component Value Date/Time   CHOL 132 05/13/2019 0815   TRIG 116 05/13/2019 0815   TRIG 131 02/22/2013 0804   HDL 49 05/13/2019 0815   HDL 40 02/22/2013 0804   CHOLHDL 2.7 05/13/2019 0815   CHOLHDL 3.5 05/09/2015 1400   VLDL 31 (H) 05/09/2015 1400   LDLCALC 62 05/13/2019 0815   LDLCALC 50 02/22/2013 0804   RADIOLOGY: Dg Chest 2 View  08/17/2012   *RADIOLOGY REPORT*  Clinical Data: Renal cell carcinoma  CHEST - 2 VIEW  Comparison: 05/08/11  Findings: The cardiomediastinal silhouette is stable.  No acute infiltrate or pleural effusion.  No pulmonary edema.  Bony thorax is unremarkable.  IMPRESSION: No active disease.  No significant change.   Original Report Authenticated By: Lahoma Crocker, M.D.    CV STUDIES:  Echo 12/30/2018 Left ventricular ejection fraction, by  visual estimation, is 55 to 60%. The left ventricle has normal function. There is no left  ventricular hypertrophy. 2. Left ventricular diastolic parameters are indeterminate. 3. The left ventricle has no regional wall motion abnormalities. 4. Global right ventricle has normal systolic function.The right ventricular size is normal. No increase in right ventricular wall thickness. 5. Left atrial size was mildly dilated. 6. Right atrial size was normal. 7. The mitral valve is normal in structure. No evidence of mitral valve regurgitation. No evidence of mitral stenosis. 8. The tricuspid valve is normal in structure. Tricuspid valve regurgitation is not demonstrated. 9. The aortic valve is normal in structure. Aortic valve regurgitation is trivial. Mild aortic valve sclerosis without stenosis. 10. The pulmonic valve was grossly normal. Pulmonic valve regurgitation is not visualized. 11. The inferior vena cava is normal in size with greater than 50% respiratory variability, suggesting right atrial pressure of 3 mmHg.   CARDIAC CATHETERIZATION: 01/04/2019   Mid LM to Mid LAD lesion is 10% stenosed.  2nd Diag lesion is 40% stenosed.  Mid LAD to Dist LAD lesion is 20% stenosed.  Prox Cx to Mid Cx lesion is 5% stenosed.  Ost Cx to Prox Cx lesion is 20% stenosed.  1st Mrg lesion is 40% stenosed.  2nd Mrg lesion is 30% stenosed.  Ost RCA to Mid RCA lesion is 20% stenosed.  Acute Mrg lesion is 70% stenosed.  Dist RCA lesion is 30% stenosed.  RPAV lesion is 20% stenosed.  The left ventricular systolic function is normal.  LV end diastolic pressure is normal.  RPDA lesion is 40% stenosed.   Widely patent stents in the LAD and circumflex vessel.  The LAD has 40% stenosis in a small diagonal branch arising just distal to the stent in the mid LAD.  There is mild 20% mid LAD narrowing; complex vessel has smooth 20% narrowing proximal to the stented segment.  Small marginal branches  arising within the circumflex stent had ostial narrowing of 40 and 30%; the  right coronary artery has moderate diffuse calcification with irregularity with narrowing of 20 to 30% in the mid vessel, 30% prior to PDA, and with no significant restenosis at the site of distal PTCA.  A small acute marginal branch has 70 to 80% ostial narrowing in a small caliber vessel.  LVEDP 17 - 23 MM hG.  RECOMMENDATION: The catheterization study is not significantly changed from the patient's last catheterization in July 2018.  Atherectomy sites with stenting in the LAD and circumflex vessel remain widely patent.  There is mild small branch ostial narrowings.  The RCA is calcified with patent PTCA site.  Medical therapy will be continued.  Continue long-term DAPT as the patient has been on aspirin/Plavix.  Continue aggressive lipid-lowering therapy with target LDL less than 70.  Exercise prescription.       IMPRESSION:  1. CAD in native artery   2. Essential hypertension   3. Hyperlipidemia with target LDL less than 70   4. DOE (dyspnea on exertion)   5. Grade I diastolic dysfunction   6. Abdominal aortic aneurysm (AAA) without rupture [facility])     ASSESSMENT AND PLAN: Nicholas Douglas is a young appearing 86 year old gentleman who has CAD dating back to 48 and had undergone multiple interventions over a10 year period from 73 to 1997. He developed unstable angina symptomatology in March 2015 and catheterization done initially at [facility] showed multivessel disease.  He underwent successful staged rotational coronary atherectomy initially involving the RCA in early March, and then on 04/27/2013 repeat intervention was done to the left circumflex system with rotational atherectomy.  At that time, he had a widely patent stent of his RCA, as well as a widely patent stent in his LAD.  He also had 60-70% mid LAD stenosis, which had not progressed. In 2017 he noticed a change in symptomatology with the development  of more shortness of breath, and no energy and felt occasional episodes of vague chest discomfort with possible left arm radiation.  He was hospitalized in late April 2017 and was found to have progressive 99% distal RCA stenosis after the PDA takeoff.  His stents in the LAD and circumflex were widely patent.  There was mild concomitant CAD in the circumflex.  His RCA was diffusely diseased and calcified with narrowings of 30% of the mid segment, but there was an angulated area of narrowing calcified narrowing of 40% before the acute margin, which prevented a stent from getting beyond this.  He underwent successful PTCA  to the distal stenosis.  At catheterization in July 2018 his intervention sites were patent and there was no significant CAD progression. There was mild 30-35% narrowing in the circumflex and mild 40% narrowing in the RCA.  The stents were patent as was the prior PTCA site.  He has had issues with shortness of breath.  ARB therapy had to be discontinued secondary to hyperkalemia, but at the time he was also having a fair amount of exogenous potassium intake.  Because of progressive symptoms of increasing shortness of breath and upper back discomfort which he believed may have been his anginal equivalent definitive catheterization was most recently performed in November 2020.  Both stents are patent.  The distal RCA PTCA site was patent.  He has not had any chest tightness or chest pressure.  Since I last saw him, his shortness of breath has improved.  However if he does overexert himself he still experiences some mild exertional dyspnea.  He has been noted to have diastolic function on echocardiography and I suspect this may be contributing to some of his symptomatology.  I added low-dose spironolactone which she has tolerated.  His blood pressure today is very stable on amlodipine 10 mg, atenolol 12.5 mg twice a day, isosorbide 60 mg daily in addition to spironolactone 12.5 mg daily.  He does not  have orthostatic findings.  He continues to be on Livalo and Zetia for mixed hyperlipidemia.  LDL cholesterol on May 13, 2019 was excellent at 62.  Triglycerides were 116, HDL 49 and total cholesterol 132 on therapy.  I have recommended long-term DAPT with his multivessel CAD.  He is on finasteride for his prostate.  As long as he remains stable, I will see him in 6 months for follow-up evaluation or sooner problems arise.  [name], MD, [facility]  05/23/2019 11:00 AM

## 2019-05-23 ENCOUNTER — Encounter: Payer: Self-pay | Admitting: Cardiovascular Disease

## 2019-06-19 ENCOUNTER — Other Ambulatory Visit: Payer: Self-pay | Admitting: Cardiovascular Disease

## 2019-07-26 ENCOUNTER — Other Ambulatory Visit: Payer: Self-pay | Admitting: Internal Medicine

## 2019-07-26 DIAGNOSIS — J8410 Pulmonary fibrosis, unspecified: Secondary | ICD-10-CM

## 2019-08-05 ENCOUNTER — Ambulatory Visit: Payer: Medicare Other

## 2019-09-12 ENCOUNTER — Other Ambulatory Visit: Payer: Self-pay | Admitting: Cardiovascular Disease

## 2019-10-12 ENCOUNTER — Ambulatory Visit
Admission: RE | Admit: 2019-10-12 | Discharge: 2019-10-12 | Disposition: A | Discharge status: Home / Self Care | Payer: Medicare Other | Source: Ambulatory Visit | Attending: Pulmonary Disease | Admitting: Pulmonary Disease

## 2019-10-12 ENCOUNTER — Other Ambulatory Visit: Payer: Self-pay | Admitting: Pulmonary Disease

## 2019-10-12 ENCOUNTER — Other Ambulatory Visit: Payer: Self-pay

## 2019-10-12 ENCOUNTER — Other Ambulatory Visit (HOSPITAL_COMMUNITY): Payer: Self-pay | Admitting: Pulmonary Disease

## 2019-10-12 DIAGNOSIS — R7989 Other specified abnormal findings of blood chemistry: Secondary | ICD-10-CM

## 2019-10-12 LAB — POCT I-STAT CREATININE: Creatinine, Ser: 1.4 mg/dL — ABNORMAL HIGH (ref 0.61–1.24)

## 2019-10-12 MED ORDER — IOHEXOL 350 MG/ML SOLN
75.0000 mL | Freq: Once | INTRAVENOUS | Status: AC | PRN
  Administered 2019-10-12: 60 mL via INTRAVENOUS

## 2019-11-06 ENCOUNTER — Other Ambulatory Visit: Payer: Self-pay | Admitting: Cardiovascular Disease

## 2019-11-24 LAB — CBC
Hematocrit: 51 % (ref 37.5–51.0)
Hemoglobin: 16.4 g/dL (ref 13.0–17.7)
MCH: 28.4 pg (ref 26.6–33.0)
MCHC: 32.2 g/dL (ref 31.5–35.7)
MCV: 88 fL (ref 79–97)
Platelets: 263 10*3/uL (ref 150–450)
RBC: 5.78 x10E6/uL (ref 4.14–5.80)
RDW: 12.3 % (ref 11.6–15.4)
WBC: 6.2 10*3/uL (ref 3.4–10.8)

## 2019-11-24 LAB — COMPREHENSIVE METABOLIC PANEL
ALT: 43 IU/L (ref 0–44)
AST: 46 IU/L — ABNORMAL HIGH (ref 0–40)
Albumin/Globulin Ratio: 1.7 (ref 1.2–2.2)
Albumin: 4.5 g/dL (ref 3.6–4.6)
Alkaline Phosphatase: 65 IU/L (ref 44–121)
BUN/Creatinine Ratio: 14 (ref 10–24)
BUN: 18 mg/dL (ref 8–27)
Bilirubin Total: 0.7 mg/dL (ref 0.0–1.2)
CO2: 22 mmol/L (ref 20–29)
Calcium: 9.4 mg/dL (ref 8.6–10.2)
Chloride: 103 mmol/L (ref 96–106)
Creatinine, Ser: 1.29 mg/dL — ABNORMAL HIGH (ref 0.76–1.27)
GFR calc Af Amer: 58 mL/min/{1.73_m2} — ABNORMAL LOW (ref >59)
GFR calc non Af Amer: 50 mL/min/{1.73_m2} — ABNORMAL LOW (ref >59)
Globulin, Total: 2.6 g/dL (ref 1.5–4.5)
Glucose: 95 mg/dL (ref 65–99)
Potassium: 4.6 mmol/L (ref 3.5–5.2)
Sodium: 141 mmol/L (ref 134–144)
Total Protein: 7.1 g/dL (ref 6.0–8.5)

## 2019-11-24 LAB — LIPID PANEL
Chol/HDL Ratio: 2.9 ratio (ref 0.0–5.0)
Cholesterol, Total: 152 mg/dL (ref 100–199)
HDL: 52 mg/dL (ref >39)
LDL Chol Calc (NIH): 78 mg/dL (ref 0–99)
Triglycerides: 122 mg/dL (ref 0–149)
VLDL Cholesterol Cal: 22 mg/dL (ref 5–40)

## 2019-12-02 ENCOUNTER — Encounter: Payer: Self-pay | Admitting: Cardiovascular Disease

## 2019-12-02 ENCOUNTER — Other Ambulatory Visit: Payer: Self-pay | Admitting: *Deleted

## 2019-12-02 ENCOUNTER — Other Ambulatory Visit: Payer: Self-pay

## 2019-12-02 ENCOUNTER — Ambulatory Visit (INDEPENDENT_AMBULATORY_CARE_PROVIDER_SITE_OTHER): Payer: Medicare Other | Admitting: Cardiovascular Disease

## 2019-12-02 DIAGNOSIS — I714 Abdominal aortic aneurysm, without rupture, unspecified: Secondary | ICD-10-CM

## 2019-12-02 DIAGNOSIS — I10 Essential (primary) hypertension: Secondary | ICD-10-CM | POA: Diagnosis not present

## 2019-12-02 DIAGNOSIS — E785 Hyperlipidemia, unspecified: Secondary | ICD-10-CM | POA: Diagnosis not present

## 2019-12-02 DIAGNOSIS — I2510 Atherosclerotic heart disease of native coronary artery without angina pectoris: Secondary | ICD-10-CM | POA: Diagnosis not present

## 2019-12-02 DIAGNOSIS — I5189 Other ill-defined heart diseases: Secondary | ICD-10-CM | POA: Diagnosis not present

## 2019-12-02 MED ORDER — SPIRONOLACTONE 25 MG PO TABS: 12.5000 mg | ORAL_TABLET | Freq: Every day | ORAL | 3 refills | Status: DC

## 2019-12-02 MED ORDER — CLOPIDOGREL BISULFATE 75 MG PO TABS: 75.0000 mg | ORAL_TABLET | Freq: Every day | ORAL | 3 refills | Status: DC

## 2019-12-02 MED ORDER — AMLODIPINE BESYLATE 10 MG PO TABS: 10.0000 mg | ORAL_TABLET | Freq: Every evening | ORAL | 3 refills | Status: DC

## 2019-12-02 NOTE — Patient Instructions (Signed)
Medication Instructions:  No changes *If you need a refill on your cardiac medications before your next appointment, please call your pharmacy*   Lab Work: Your provider would like for you to return in 6 months to have the following labs drawn: CBC, CMET, TSH and Lipid. You do not need an appointment for the lab. Once in our office lobby there is a podium where you can sign in and ring the doorbell to alert [place] that you are here. The lab is open from 8:00 am to 4:30 pm; closed for lunch from 12:45pm-1:45pm.  If you have labs (blood work) drawn today and your tests are completely normal, you will receive your results only by: Marland Kitchen MyChart Message (if you have MyChart) OR . A paper copy in the mail If you have any lab test that is abnormal or we need to change your treatment, we will call you to review the results.   Testing/Procedures: None ordered   Follow-Up: At [facility], you and your health needs are our priority.  As part of our continuing mission to provide you with exceptional heart care, we have created designated Provider Care Teams.  These Care Teams include your primary Cardiologist (physician) and Advanced Practice Providers (APPs -  Physician Assistants and Nurse Practitioners) who all work together to provide you with the care you need, when you need it.  We recommend signing up for the patient portal called "MyChart".  Sign up information is provided on this After Visit Summary.  MyChart is used to connect with patients for Virtual Visits (Telemedicine).  Patients are able to view lab/test results, encounter notes, upcoming appointments, etc.  Non-urgent messages can be sent to your provider as well.   To learn more about what you can do with MyChart, go to [email or web address].    Your next appointment:   6 month(s)  The format for your next appointment:   In Person  Provider:   You may see [name], MD or one of the following Advanced Practice Providers on your  designated Care Team:    Almyra Deforest, PA-C  Fabian Sharp, PA-C or   Roby Lofts, [place]

## 2019-12-02 NOTE — Progress Notes (Signed)
Patient ID: DYLLON HENKEN, male   DOB: [birth year], 86 y.o.   MRN: [id number]     HPI: CHARLENE COWDREY is an 86 y.o. Caucasian male who presents to the office today for a 86-monthcardiology evaluation.  Mr. OCommissohas known CAD and underwent numerous interventions dating back to 175 1991, 1993, and in 1997. Cardiac catheterization in 2011 which showed preserved LV function with mild residual distal inferior apical hypocontractility. There is evidence for coronary calcification with segmental narrowing of his LAD of 30-40% proximally, 50% diffusely, in the midsegment, and 70-80% in the distal region, he had AV groove circumflex stenoses of 70 and 50% with a 40% OM 2 stenosis, and a 30-40 and 50% RCA stenoses with 80-90% stenosis in the acute marginal branch. He had been on medical therapy.  [facility] and catheterization demonstrated severe 2 vessel CAD with significant current calcification of the LAD with up to 95% mid LAD stenosis and diffuse proximal stenosis. He underwent successful high-speed rotational atherectomy the following day by Dr. JMartiniqueand a 1.5 mm bur was used. Mid LAD was stented with a 2.75x38 mm Promus stent in the proximal LAD was stented with a 3.0x28 mm Promus stent. Medical therapy was recommended for his distal LAD stenosis. He has only one kidney and staged intervention to the left circumflex coronary artery was recommended.  He underwent staged left circumflex PCI on 04/27/2013 by me with successful high-speed rotational atherectomy with a 1.5 and 1.75 mm burr, and ultimately had a 2.5x28 mm Promus premier DES stent inserted into the circumflex vessel, which was post dilated to approximately 2.6 mm.  Subsequently he has felt significantly improved with resolution of any chest pain  He  has a history of significant hyperlipidemia and has had significant increased number of small LDL particles and insulin resistance. This seemed  to markedly improve with the addition of Niaspan added to zetia and lovaza.  Remotely, he had been on statins consisting of Lipitor and subsequently Crestor. He did have transient LFT elevation. He also concerns of possible risk of developing dementia with statin therapy.  He had  some episodes of Niaspan induced diffuse flushing. He wanted to stop taking his Niaspan. He did have subsequent NMR off Niaspan and done just on Zetia and as well as 2 g of Lovaza.  This showed increased abnormalities such that his total cholesterol was 201, LDL had risen to 124, but he now had LDL small particles which have increased from 685 to 1348. Laboratory on 10/26/2012: LDL particle number was elevated at 1657, LDL cholesterol 112 triglycerides 155 and total cholesterol 190. He continued to be insulin resistant with insulin resistance scored 65. When I last saw him, we elected to try Livalo and ultimately titrated this to 4 mg daily to take in addition to his Zetia 10 mg. Laboratory on 02/22/2013: LDL particle number was  markedly improved at 774 with an HDL of 40 calculated LDL 50 small LDL particle #417. Insulin resistance score was also improved at 51.  In July 2015 he developed abdominal discomfort and was found to have acute appendicitis. On CT imaging he was also noted to have prostate enlargement with nodularity and thickening of the bladder base and cystoscopy was suggested for further evaluation. He is status post left nephrectomy. He also was noted to have a 2.3 cm infrarenal suprailiac abdominal aortic aneurysm.  He has been on Livalo 2 mg, Zetia  10 mg for hyperlipidemia.  Lab work on 12/22/2013 showed a cholesterol 155, triglycerides 130, HDL 45, LDL 84.  He has been on amlodipine 10 mg atenolol 12.5 mg twice a day and valsartan 80 mg daily for blood pressure.  He continues to be on aspirin and Plavix for dual antiplatelet therapy.  A nuclear perfusion study on 06/30/2014  revealed normal perfusion and function with  an ejection fraction of 57%.    When he underwent a CT scan for his appendicitis he was told of having a small abdominal aortic aneurysm.  His mother also had an abdominal aortic aneurysm.  The patient recently sprained his right ankle.  As result, he has not been able to be as active as he had in the past and has not been able to play golf which typically he had played up to 4 times per week, often scoring in the 70s.    He was recently admitted to the hospital in April 2017 with complaints of increasing episodes of chest discomfort.  Troponins were mildly positive at 0.12, consistent with a non-STEMI.  I performed cardiac catheterization on 05/28/2015.  This showed widely patent stents in the LAD and circumflex vessels.  The RCA was diffusely diseased with calcification in the proximal to mid region with distal 99% stenosis and a noncalcified distal RCA segment immediately after the PDA takeoff.  ECI was difficult due to the calcified RCA preventing placement of a stent since the stent was never able to be passed beyond this calcified angled segment despite even attempting guide liner support.  Ultimately, he underwent successful PTCA of the 99% stenosis, which was reduced to 0%.  Since his intervention he had noticed dramatic improvement in his previous symptomatology.  He continues to feel well and is playing golf 3 times per week. He had a basal cell cancer removed from his nose.   When I saw him in April 2018 he was without chest pain or significant shortness of breath and was playing golf at least 3 days per week.  Laboratory showed a total cholesterol 147, triglycerides 151, HDL 43, LDL 74 on regimen of Zetia 10 mg and Livalo 2 mg.  In the past he's been intolerant to Crestor, Lipitor, and Zocor.  I further titrated Livalo 24 mg daily which  he has tolerated. An abdominal ultrasound showed aortoiliac atherosclerosis without aneurysm.   When I saw him in June 2018 he had begun to notice increasing  shortness of breath with walking. He denies any of the chest tightness or pressure that he had experienced prior to his interventions.  At his last catheterization, he had extensive calcification in his RCA and PTCA alone was done since the stent was unable to reach the subtotal distal RCA.  He denies recurrent chest tightness.  He was questioning whether or not he could have had  pneumonia comtributing to his shortness of breath.  At that evaluation, I further titrated his Imdur to 60 mg daily.  His blood pressure was also elevated and I titrated his ARB therapy.  He sagittally saw Caro Hight as an add-on in with complaints of increasing symptomatology definitive cardiac catheterization was recommended.  He underwent repeat cardiac catheterization on 08/05/2016 by Dr. Peter Martinique.  This continues to show patency of the stents in the proximal to mid LAD and patency of the stented the mid circumflex as well as patent PTCA site of the PL OM vessel.  There was no new disease to explain his symptoms.  He had normal LV function and normal left ventricular end-diastolic pressure.  Following the catheterization, he has felt well, but he continues to experience some shortness of breath with weakness and decreased energy.  He denies any chest tightness.  Recent laboratory done 10 days ago revealed an elevated potassium at 5.7.  On recheck 3 days later was 5.4.  Subsequent, he has been taken off ARB therapy.    In August 2018 he was complaining of significant fatigue.  His blood pressure was stable and his pulse was 55.  I recommended he reduce his atenolol from 12.5 mg twice a day to just 12.5 mg at bedtime.  I also reduced his isosorbide from 60 mg down to 30 mg in light of his recent catheterization findings.  His prior hypokalemia, resolved with discontinuance of ARB, but more importantly with discontinuance of his excessive exogenous potassium intake with certain foods.   When I  saw him in October 2018 he  was fairly stable. Over the winter months he has had difficulty with his back and required injections.  This has limited his exercise.  He admits to gaining weight.  He has noticed that he is more short of breath with walking up hills.  Blood pressures at home often been in the 140-150 range.  He denies any chest pressure.  He is unaware of palpitations.  He had repeat lab work one week ago.  Renal function was stable with a creatinine 1.15.  Potassium was 4.7.  LFTs were normal.  Hemoglobin and hematocrit were stable.  Lipid studies were excellent.   When I saw him in April 2019 his blood pressure recordings at home were in the 130-150 range and he had experience some shortness of breath.  I suggested a trial of HCTZ 12.5 mg every other day.  He was tolerating livalo 4 mg, vascepa and Zetia for hyperlipidemia.  Over the past 6 months, he has been without recurrent anginal symptoms.  He continues to play golf at least 3 days/week and typically score is 75 or below.  He underwent repeat laboratory last week and lipid studies remain excellent with total cholesterol 124, LDL 57, triglycerides 82, HDL 51.  Shortness of breath has improved.  He denies any orthopnea.   When I saw him in October 2019 at which time he was feeling well and his shortness of breath had improved.  His blood pressure was excellent on a regimen consisting of amlodipine 10 mg, atenolol 12.5 mg at bedtime and he had not had any recent swelling.  He was no longer taking HCTZ.  He was playing golf 3 days/week.  He was evaluated in a telemedicine visit on Jun 17, 2018.  At that time he was remaining stable and denied any symptoms of chest pain PND orthopnea.  He is still playing golf at least 3 days/week and his highest score is 82 over the past year.  He does not routinely exercise on the days he does not play golf.  He states his blood pressures typically has been running in the 494W with diastolics in the 96P to 59F.  He denied any  palpitations, episodes of presyncope or syncope.   I saw him as an add-on in the office on December 21, 2018 after he had begun to notice more exertional dyspnea as well as upper back discomfort which in the past may have been his anginal equivalent.  With progressive symptoms repeat echocardiography and definitive cardiac catheterization was recommended.    An echo  Doppler study on December 30, 2018 showed an EF of 55 to 60%.  There was mild aortic sclerosis.  Diastolic dysfunction was indeterminate.  Previously had been noted to have grade 1 diastolic dysfunction on prior echocardiography.  He underwent successful catheterization on January 04, 2019 which showed widely patent stents in the LAD and circumflex vessel.  The LAD had 40% stenosis in a small diagonal branch arising just distal to the stent in the mid LAD.  There was mild 20% mid LAD stenosis.  The circumflex vessel had smooth 20% narrowing proximal to the proximal stent.  There was mild 40 and 30% narrowings and small marginal branches. The RCA had mild CAD of 20 and 30% with calcification and there was 70% stenosis in a small acute marginal branch ostially.  Medical therapy was recommended.  I saw him for initial follow-up of his cardiac catheterization on January 13, 2019 at which time he was doing well on medical therapy.  When I last saw him in April 2016 he remained stable.  He was playing golf 3 times per week and still scoring in the low to mid 70s. He continued to be on amlodipine 10 mg, isosorbide 60 mg a atenolol 12.5 mg twice a day in addition to spironolactone both for blood pressure and his CAD.  He continues to be on DAPT with aspirin/Plavix.  He tolerates Livalo in addition to [employer].    Since I last saw him, he continues to feel well.  He had undergone a CT angio of his chest with contrast by his pulmonologist which showed minimal bibasilar subsegmental atelectasis.  He was noted to have coronary artery calcification as well as  aortic atherosclerosis.  There was no evidence for PE.  He continues to play golf at least 3 days/week and his scores are consistently below 78.  He continues to be on DAPT but denies bleeding.  He is on Zetia 10 mg in addition to Livalo 4 mg and Vascepa 1 capsule twice a day.  Recent lipid studies showed a total cholesterol 152, HDL 52, LDL 78 and triglycerides 122 from November 23, 2019.  Renal function remained stable and he is continue to be on amlodipine 10 mg, isosorbide 60 mg, atenolol 12.5 mg twice a day and spironolactone 12.5 mg daily.  He has not had any anginal symptomatology or change in exercise capacity.  He presents for 58-monthfollow-up evaluation.  Past Medical History:  Diagnosis Date  . BPH (benign prostatic hyperplasia)    [facility]ed by [name]  . CAD S/P PTCA only of RPAV-PL 05/31/2015   99% --> 0%PAV - PTCA only (unable to advance STENT) due to RCA calcification - prox & distal RCA 30%; Patent LAD stent & Cx stent (~20% ISR).   . Calculus of kidney    "that's how they found the cancer" (04/27/2013)  . Coronary artery disease involving native coronary artery of native heart with unstable angina pectoris ([place] 12/[place]   S/P Cardiac angioplasty 1986, 1991, 1993, 1997.  Last cardiac catheterization 2001.  Cardiolite 05/2011 low risk with normal EF 65%.  Followed by cardiology/[facility] every six months.   . History of myocardial infarction 1976  . Hypertension   . Impotence of organic origin   . Internal hemorrhoids without mention of complication   . Malignant neoplasm of kidney, except pelvis   . Melanoma ([place] 06[place]   Left arm  . [place] of [place] 02[place]07[place]   R upper back; excision [facility].  .Marland Kitchen  Other abnormal blood chemistry   . Other nonspecific abnormal serum enzyme levels   . Personal history of colonic polyps   . Pure hypercholesterolemia   . Tobacco use disorder   . Unspecified congenital cystic kidney disease   . Unstable angina ([place]) 04/05/2013;  05/2015    Past Surgical History:  Procedure Laterality Date  . APPENDECTOMY  2014  . CARDIAC CATHETERIZATION N/A 05/30/2015   Procedure: Left Heart Cath and Coronary Angiography;  Surgeon: [name], MD;  Location: [facility];  Service: Cardiovascular;  Laterality: N/A;  . CARDIAC CATHETERIZATION N/A 05/30/2015   Procedure: Coronary Balloon Angioplasty;  Surgeon: [name], MD;  Location: [facility];  Service: Cardiovascular;  Laterality: N/A;  . Cardiolite  05/06/2011   low risk study; normal EF.  [facility]  . carotid dopplers  03/08/2011   minimal plaque formation B. Symptoms: dizziness.  . COLONOSCOPY  10/05/2008   single polyp, [facility].  [name].  Repeat in 3 years.  . CORONARY ANGIOPLASTY     "I've had 4" (04/27/2013)  . CORONARY ANGIOPLASTY WITH STENT PLACEMENT  04/2013; 04/27/2013   "2 + 1" (04/27/2013)  . EYE SURGERY  11/04/2013   Cataract surgery B; [facility].  Marland Kitchen LEFT HEART CATH AND CORONARY ANGIOGRAPHY N/A 08/05/2016   Procedure: Left Heart Cath and Coronary Angiography;  Surgeon: Martinique, Peter M, MD;  Location: [facility];  Service: Cardiovascular;  Laterality: N/A;  . LEFT HEART CATH AND CORONARY ANGIOGRAPHY N/A 01/04/2019   Procedure: LEFT HEART CATH AND CORONARY ANGIOGRAPHY;  Surgeon: [name], MD;  Location: [facility];  Service: Cardiovascular;  Laterality: N/A;  . MELANOMA EXCISION Left ~ 2007   "arm"  . MELANOMA EXCISION  02/07/2015   R upper back. [facility]  . NEPHRECTOMY Left 2006  . PERCUTANEOUS CORONARY ROTOBLATOR INTERVENTION (PCI-R) N/A 04/06/2013   Procedure: PERCUTANEOUS CORONARY ROTOBLATOR INTERVENTION (PCI-R);  Surgeon: [name], MD;  [facility];  Service: Cardiovascular;  Laterality: N/A;  . PERCUTANEOUS CORONARY STENT INTERVENTION (PCI-S) N/A 04/27/2013   Procedure: PERCUTANEOUS CORONARY STENT INTERVENTION (PCI-S);  Surgeon: [name], MD;  Location: [facility] CATH LAB;  Service: Cardiovascular;  Laterality: N/A;     Allergies  Allergen Reactions  . Angiotensin Receptor Blockers     Other reaction(s): Kidney Disorder Hyperkalemia  . Lovastatin Other (See Comments)    Elevated liver enzymes  . Morphine Hives  . Nifedipine Other (See Comments)    Elevated liver enzymes    Current Outpatient Medications  Medication Sig Dispense Refill  . acetaminophen (TYLENOL) 500 MG tablet Take 500 mg by mouth every 6 (six) hours as needed (for pain.).     Marland Kitchen amLODipine (NORVASC) 10 MG tablet Take 1 tablet (10 mg total) by mouth every evening. 90 tablet 3  . aspirin 81 MG tablet Take 81 mg by mouth every evening.     Marland Kitchen atenolol (TENORMIN) 25 MG tablet Take 0.5 tablets (12.5 mg total) by mouth 2 (two) times daily. 90 tablet 3  . Carboxymethylcell-Hypromellose (GENTEAL OP) Apply 1 drop to eye 2 (two) times daily.     . clopidogrel (PLAVIX) 75 MG tablet Take 1 tablet (75 mg total) by mouth daily. 90 tablet 3  . ezetimibe (ZETIA) 10 MG tablet TAKE 1 TABLET DAILY 90 tablet 3  . finasteride (PROSCAR) 5 MG tablet Take 1 tablet (5 mg total) by mouth daily. (Patient taking differently: Take 2.5 mg by mouth daily. )  90 tablet 3  . icosapent Ethyl (VASCEPA) 1 g capsule Take 1 capsule (1 g total) by mouth 2 (two) times daily. 180 capsule 2  . isosorbide mononitrate (IMDUR) 60 MG 24 hr tablet Take 1 tablet (60 mg total) by mouth daily. 90 tablet 2  . nitroGLYCERIN (NITROSTAT) 0.4 MG SL tablet PLACE 1 TABLET (0.4 MG TOTAL) UNDER THE TONGUE EVERY 5 (FIVE) MINUTES AS NEEDED FOR CHEST PAIN. 25 tablet 11  . Pitavastatin Calcium (LIVALO) 4 MG TABS Take 1 tablet (4 mg total) by mouth every evening. 90 tablet 3  . spironolactone (ALDACTONE) 25 MG tablet Take 0.5 tablets (12.5 mg total) by mouth daily. 45 tablet 3  . traZODone (DESYREL) 50 MG tablet      No current facility-administered medications for this visit.    Socially he remains active. He is married has 5 children 10 grandchildren 2 great-grandchildren. Is no alcohol use.  He typically scores below 75 and plays golf 3 days per week.  He typically scores in the 70s, better than his age.  ROS General: Negative; No fevers, chills, or night sweats;  Improved energy HEENT: Negative; No changes in vision or hearing, sinus congestion, difficulty swallowing Pulmonary: Positive for shortness of breath Cardiovascular: See history of present illness;  GI: Negative; No nausea, vomiting, diarrhea, or abdominal pain GU: Negative; No dysuria, hematuria, or difficulty voiding Musculoskeletal: Negative; no myalgias, joint pain, or weakness Hematologic/Oncology: Negative; no easy bruising, bleeding Endocrine: Negative; no heat/cold intolerance; no diabetes Neuro: Negative; no changes in balance, headaches Skin: Negative; No rashes or skin lesions Psychiatric: Negative; No behavioral problems, depression Sleep: Negative; No snoring, daytime sleepiness, hypersomnolence, bruxism, restless legs, hypnogognic hallucinations, no cataplexy Other comprehensive 14 point system review is negative.   PE BP 128/70   Pulse (!) 56   Ht _0  (1.702 m)   Wt 195 lb (88.5 kg)   SpO2 94%   BMI 30.54 kg/m    Repeat blood pressure by me was 130/70  Wt Readings from Last 3 Encounters:  12/02/19 195 lb (88.5 kg)  05/21/19 199 lb 6.4 oz (90.4 kg)  01/13/19 192 lb (87.1 kg)   General: Alert, oriented, no distress.  Skin: normal turgor, no rashes, warm and dry HEENT: Normocephalic, atraumatic. Pupils equal round and reactive to light; sclera anicteric; extraocular muscles intact;  Nose without nasal septal hypertrophy Mouth/Parynx benign; Mallinpatti scale 3 Neck: No JVD, no carotid bruits; normal carotid upstroke Lungs: clear to ausculatation and percussion; no wheezing or rales Chest wall: without tenderness to palpitation Heart: PMI not displaced, RRR, s1 s2 normal, 1/6 systolic murmur, no diastolic murmur, no rubs, gallops, thrills, or heaves Abdomen: soft, nontender; no  hepatosplenomehaly, BS+; abdominal aorta nontender and not dilated by palpation. Back: no CVA tenderness Pulses 2+ Musculoskeletal: full range of motion, normal strength, no joint deformities Extremities: no clubbing cyanosis or edema, Homan's sign negative  Neurologic: grossly nonfocal; Cranial nerves grossly wnl Psychologic: Normal mood and affect   ECG (independently read by me):  Sinus bradycardia at 56; no ectopy, normal intervals    May 21, 2019 ECG (independently read by me): Sinus bradycardia 54 bpm.  PR interval 188 ms, QTc interval 421 ms.  No ectopy.  January 13, 2019 ECG (independently read by me): Sinus bradycardia 54 bpm.  No ectopy.  PR interval 192 ms, QTc interval 4 3 ms  December 21, 2018 ECG (independently read by me): Normal sinus rhythm at 77 bpm.  Nonspecific ST changes.  Normal intervals  October 20119 ECG (independently read by me): Normal sinus rhythm at 61 bpm.  No ectopy.  Normal intervals.  April 2019 ECG (independently read by me):normal sinus rhythm at 61 bpm.  No ectopy.  No ST segment changes.  October 2018 ECG (independently read by me): Sinus bradycardia 57 bpm.  No ST segment changes.  Normal intervals.  August 2018 ECG (independently read by me): Sinus bradycardia 55 bpm.  Normal intervals.  No significant ST-T changes  June 2018 ECG (independently read by me): Sinus bradycardia 58 bpm.  Normal intervals.  No significant ST-T changes.  April 2018 ECG (independently read by me): Normal sinus rhythm at 63 bpm.  Normal intervals.  No ST segment changes.  September 2017 ECG (independently read by me): Normal sinus rhythm at 60 bpm.  Nonspecific T changes.  Intervals are normal.  May 2017 ECG (independently read by me): Sinus bradycardia 55 bpm.  No ectopy.  Normal intervals.  Nondiagnostic T changes in lead 3.  April 2017 ECG (independently read by me): Sinus bradycardia 55 bpm.  No ectopy.  No significant ST changes.  Normal intervals.  01/12/2015  ECG (independently read by me): Sinus bradycardia 54 bpm.  No ectopy.  Normal intervals.  June 2016 ECG (independently read by me): Sinus bradycardia 58 bpm.  Normal intervals.  No ectopy. Non-specific T change aVL  November 2015 ECG (independently read by me): Sinus bradycardia 54 bpm.  No significant ST-T changes.  Normal intervals.  May 2015 ECG (independently read by me): Sinus bradycardia 54 beats per minute.  No ectopy.  QTc interval 396 ms.  No significant ST changes.  04/22/2013 ECG (independently read by me): Sinus bradycardia 55 beats per minute. Nonspecific ST changes  03/01/2013 ECG (independently read by me): Sinus bradycardia 52 beats per minute. Normal intervals. No significant ST changes.  Prior ECG of 11/20/2012: Sinus rhythm at 51 beats per minute. QTc interval 400 ms. No significant ST changes.  LABS:  BMP Latest Ref Rng & Units 11/23/2019 10/12/2019 05/13/2019  Glucose 65 - 99 mg/dL 95 - 110(H)  BUN 8 - 27 mg/dL 18 - 18  Creatinine 0.76 - 1.27 mg/dL 1.29(H) 1.40(H) 1.34(H)  BUN/Creat Ratio 10 - 24 14 - 13  Sodium 134 - 144 mmol/L 141 - 146(H)  Potassium 3.5 - 5.2 mmol/L 4.6 - 5.2  Chloride 96 - 106 mmol/L 103 - 108(H)  CO2 20 - 29 mmol/L 22 - 25  Calcium 8.6 - 10.2 mg/dL 9.4 - 9.7   Hepatic Function Latest Ref Rng & Units 11/23/2019 05/13/2019 05/25/2018  Total Protein 6.0 - 8.5 g/dL 7.1 7.1 7.7  Albumin 3.6 - 4.6 g/dL 4.5 4.2 4.6  AST 0 - 40 IU/L 46(H) 35 36  ALT 0 - 44 IU/L 43 36 41  Alk Phosphatase 44 - 121 IU/L 65 62 67  Total Bilirubin 0.0 - 1.2 mg/dL 0.7 0.9 0.9  Bilirubin, Direct 0.00 - 0.40 mg/dL - - -   CBC Latest Ref Rng & Units 11/23/2019 05/13/2019 12/30/2018  WBC 3.4 - 10.8 x10E3/uL 6.2 6.3 7.0  Hemoglobin 13.0 - 17.7 g/dL 16.4 15.4 16.5  Hematocrit 37.5 - 51.0 % 51.0 45.9 49.1  Platelets 150 - 450 x10E3/uL 263 231 262   Lab Results  Component Value Date   MCV 88 11/23/2019   MCV 87 05/13/2019   MCV 87 12/30/2018   Lab Results  Component Value  Date   TSH 2.310 05/13/2019  .  Lipid Panel     Component  Value Date/Time   CHOL 152 11/23/2019 0729   TRIG 122 11/23/2019 0729   TRIG 131 02/22/2013 0804   HDL 52 11/23/2019 0729   HDL 40 02/22/2013 0804   CHOLHDL 2.9 11/23/2019 0729   CHOLHDL 3.5 05/09/2015 1400   VLDL 31 (H) 05/09/2015 1400   LDLCALC 78 11/23/2019 0729   LDLCALC 50 02/22/2013 0804   RADIOLOGY: Dg Chest 2 View  08/17/2012   *RADIOLOGY REPORT*  Clinical Data: Renal cell carcinoma  CHEST - 2 VIEW  Comparison: 05/08/11  Findings: The cardiomediastinal silhouette is stable.  No acute infiltrate or pleural effusion.  No pulmonary edema.  Bony thorax is unremarkable.  IMPRESSION: No active disease.  No significant change.   Original Report Authenticated By: Lahoma Crocker, M.D.    CV STUDIES:  Echo 12/30/2018 Left ventricular ejection fraction, by visual estimation, is 55 to 60%. The left ventricle has normal function. There is no left ventricular hypertrophy. 2. Left ventricular diastolic parameters are indeterminate. 3. The left ventricle has no regional wall motion abnormalities. 4. Global right ventricle has normal systolic function.The right ventricular size is normal. No increase in right ventricular wall thickness. 5. Left atrial size was mildly dilated. 6. Right atrial size was normal. 7. The mitral valve is normal in structure. No evidence of mitral valve regurgitation. No evidence of mitral stenosis. 8. The tricuspid valve is normal in structure. Tricuspid valve regurgitation is not demonstrated. 9. The aortic valve is normal in structure. Aortic valve regurgitation is trivial. Mild aortic valve sclerosis without stenosis. 10. The pulmonic valve was grossly normal. Pulmonic valve regurgitation is not visualized. 11. The inferior vena cava is normal in size with greater than 50% respiratory variability, suggesting right atrial pressure of 3 mmHg.   CARDIAC CATHETERIZATION: 01/04/2019   Mid LM to Mid LAD  lesion is 10% stenosed.  2nd Diag lesion is 40% stenosed.  Mid LAD to Dist LAD lesion is 20% stenosed.  Prox Cx to Mid Cx lesion is 5% stenosed.  Ost Cx to Prox Cx lesion is 20% stenosed.  1st Mrg lesion is 40% stenosed.  2nd Mrg lesion is 30% stenosed.  Ost RCA to Mid RCA lesion is 20% stenosed.  Acute Mrg lesion is 70% stenosed.  Dist RCA lesion is 30% stenosed.  RPAV lesion is 20% stenosed.  The left ventricular systolic function is normal.  LV end diastolic pressure is normal.  RPDA lesion is 40% stenosed.   Widely patent stents in the LAD and circumflex vessel.  The LAD has 40% stenosis in a small diagonal branch arising just distal to the stent in the mid LAD.  There is mild 20% mid LAD narrowing; complex vessel has smooth 20% narrowing proximal to the stented segment.  Small marginal branches arising within the circumflex stent had ostial narrowing of 40 and 30%; the  right coronary artery has moderate diffuse calcification with irregularity with narrowing of 20 to 30% in the mid vessel, 30% prior to PDA, and with no significant restenosis at the site of distal PTCA.  A small acute marginal branch has 70 to 80% ostial narrowing in a small caliber vessel.  LVEDP 17 - 23 MM hG.  RECOMMENDATION: The catheterization study is not significantly changed from the patient's last catheterization in July 2018.  Atherectomy sites with stenting in the LAD and circumflex vessel remain widely patent.  There is mild small branch ostial narrowings.  The RCA is calcified with patent PTCA site.  Medical therapy will be continued.  Continue long-term DAPT as the patient has been on aspirin/Plavix.  Continue aggressive lipid-lowering therapy with target LDL less than 70.  Exercise prescription.       IMPRESSION:  1. CAD in native artery   2. Essential hypertension   3. Hyperlipidemia with target LDL less than 70   4. Essential hypertension, benign   5. Grade I diastolic dysfunction    6. Abdominal aortic aneurysm (AAA) without rupture [facility])     ASSESSMENT AND PLAN: Mr. Momodou Consiglio is a young appearing 86 year old gentleman who has CAD dating back to 28 and had undergone multiple interventions over a10 year period from 89 to 1997. He developed unstable angina symptomatology in March 2015 and catheterization done initially at [facility] showed multivessel disease.  He underwent successful staged rotational coronary atherectomy initially involving the RCA in early March, and then on 04/27/2013 repeat intervention was done to the left circumflex system with rotational atherectomy.  At that time, he had a widely patent stent of his RCA, as well as a widely patent stent in his LAD.  He also had 60-70% mid LAD stenosis, which had not progressed. In 2017 he noticed a change in symptomatology with the development of more shortness of breath, and no energy and felt occasional episodes of vague chest discomfort with possible left arm radiation.  He was hospitalized in late April 2017 and was found to have progressive 99% distal RCA stenosis after the PDA takeoff.  His stents in the LAD and circumflex were widely patent.  There was mild concomitant CAD in the circumflex.  His RCA was diffusely diseased and calcified with narrowings of 30% of the mid segment, but there was an angulated area of narrowing calcified narrowing of 40% before the acute margin, which prevented a stent from getting beyond this.  He underwent successful PTCA  to the distal stenosis.  At catheterization in July 2018 his intervention sites were patent and there was no significant CAD progression. There was mild 30-35% narrowing in the circumflex and mild 40% narrowing in the RCA.  The stents were patent as was the prior PTCA site.  He has had issues with shortness of breath.  ARB therapy had to be discontinued secondary to hyperkalemia, but at the time he was also having a fair amount of exogenous potassium intake.  Because of  progressive symptoms of increasing shortness of breath and upper back discomfort which he believed may have been his anginal equivalent definitive catheterization was  performed in November 2020.  The LAD and circumflex stents were patent his distal RCA PTCA site was without restenosis.  Presently, he continues to be asymptomatic.  Blood pressure today is stable and he is tolerating amlodipine 10 mg, isosorbide 60 mg, spironolactone 25 mg, and atenolol 12.5 mg twice a day.  He has been intolerant of other statins but is able to tolerate Livalo 4 mg and is also on Zetia 10 mg as well as Vascepa with most recent LDL cholesterol at 78 and triglycerides of 122.  He continues to be on DAPT without bleeding.  He takes finasteride fpr prostate.  I have recommended he continue his current medical regimen.  In 6 months I have I have recommended follow-up laboratory and cardiology evaluation or sooner as needed.    [name], MD, [facility]  12/02/2019 1:47 PM

## 2019-12-23 ENCOUNTER — Encounter: Payer: Medicare Other | Attending: Pulmonary Disease

## 2019-12-23 ENCOUNTER — Other Ambulatory Visit: Payer: Self-pay

## 2019-12-23 DIAGNOSIS — F17290 Nicotine dependence, other tobacco product, uncomplicated: Secondary | ICD-10-CM | POA: Insufficient documentation

## 2019-12-23 DIAGNOSIS — J984 Other disorders of lung: Secondary | ICD-10-CM

## 2019-12-23 NOTE — Progress Notes (Signed)
Virtual Visit completed. Patient informed on EP and RD appointment and 6 Minute walk test. Patient also informed of patient health questionnaires on My Chart. Patient Verbalizes understanding. Visit diagnosis can be found in [facility] 11/16/2019.

## 2020-01-04 ENCOUNTER — Other Ambulatory Visit: Payer: Self-pay

## 2020-01-04 VITALS — Ht 67.5 in | Wt 192.7 lb

## 2020-01-04 DIAGNOSIS — F17290 Nicotine dependence, other tobacco product, uncomplicated: Secondary | ICD-10-CM | POA: Diagnosis not present

## 2020-01-04 DIAGNOSIS — J984 Other disorders of lung: Secondary | ICD-10-CM

## 2020-01-04 NOTE — Patient Instructions (Signed)
Patient Instructions  Patient Details  Name: Nicholas Douglas MRN: [id number] Date of Birth: 07-Jul-1931 Referring Provider:  [name], MD  Below are your personal goals for exercise, nutrition, and risk factors. Our goal is to help you stay on track towards obtaining and maintaining these goals. We will be discussing your progress on these goals with you throughout the program.  Initial Exercise Prescription:  Initial Exercise Prescription - 01/04/20 1500      Date of Initial Exercise RX and Referring Provider   Date 01/04/20    Referring Provider [name] MD      Treadmill   MPH 2.2    Grade 0    Minutes 15    METs 2.68      NuStep   Level 2    SPM 80    Minutes 15    METs 2.2      REL-XR   Level 2    Speed 50    Minutes 15    METs 2.2      Prescription Details   Frequency (times per week) 2    Duration Progress to 30 minutes of continuous aerobic without signs/symptoms of physical distress      Intensity   THRR 40-80% of Max Heartrate 90-119    Ratings of Perceived Exertion 11-13    Perceived Dyspnea 0-4      Progression   Progression Continue to progress workloads to maintain intensity without signs/symptoms of physical distress.      Resistance Training   Training Prescription Yes    Weight 3 lb    Reps 10-15           Exercise Goals: Frequency: Be able to perform aerobic exercise two to three times per week in program working toward 2-5 days per week of home exercise.  Intensity: Work with a perceived exertion of 11 (fairly light) - 15 (hard) while following your exercise prescription.  We will make changes to your prescription with you as you progress through the program.   Duration: Be able to do 30 to 45 minutes of continuous aerobic exercise in addition to a 5 minute warm-up and a 5 minute cool-down routine.   Nutrition Goals: Your personal nutrition goals will be established when you do your nutrition analysis with the  dietician.  The following are general nutrition guidelines to follow: Cholesterol < 200mg /day Sodium < 1500mg /day Fiber: Men over 50 yrs - 30 grams per day  Personal Goals:  Personal Goals and Risk Factors at Admission - 01/04/20 1547      Core Components/Risk Factors/Patient Goals on Admission    Weight Management Yes;Weight Loss    Intervention Weight Management: Provide education and appropriate resources to help participant work on and attain dietary goals.;Weight Management: Develop a combined nutrition and exercise program designed to reach desired caloric intake, while maintaining appropriate intake of nutrient and fiber, sodium and fats, and appropriate energy expenditure required for the weight goal.;Weight Management/Obesity: Establish reasonable short term and long term weight goals.    Admit Weight 192 lb (87.1 kg)    Goal Weight: Short Term 187 lb (84.8 kg)    Goal Weight: Long Term 182 lb (82.6 kg)    Expected Outcomes Short Term: Continue to assess and modify interventions until short term weight is achieved;Long Term: Adherence to nutrition and physical activity/exercise program aimed toward attainment of established weight goal;Weight Loss: Understanding of general recommendations for a balanced deficit meal plan, which promotes 1-2 lb weight loss per  week and includes a negative energy balance of [phone] kcal/d;Understanding recommendations for meals to include 15-35% energy as protein, 25-35% energy from fat, 35-60% energy from carbohydrates, less than 200mg  of dietary cholesterol, 20-35 gm of total fiber daily;Understanding of distribution of calorie intake throughout the day with the consumption of 4-5 meals/snacks    Tobacco Cessation Yes    Number of packs per day Smokes cigars- socially    Intervention Assist the participant in steps to quit. Provide individualized education and counseling about committing to Tobacco Cessation, relapse prevention, and pharmacological  support that can be provided by physician.;Advice worker, assist with locating and accessing local/national Quit Smoking programs, and support quit date choice.    Expected Outcomes Short Term: Will demonstrate readiness to quit, by selecting a quit date.;Short Term: Will quit all tobacco product use, adhering to prevention of relapse plan.;Long Term: Complete abstinence from all tobacco products for at least 12 months from quit date.    Improve shortness of breath with ADL's Yes    Intervention Provide education, individualized exercise plan and daily activity instruction to help decrease symptoms of SOB with activities of daily living.    Expected Outcomes Short Term: Improve cardiorespiratory fitness to achieve a reduction of symptoms when performing ADLs;Long Term: Be able to perform more ADLs without symptoms or delay the onset of symptoms    Hypertension Yes    Intervention Provide education on lifestyle modifcations including regular physical activity/exercise, weight management, moderate sodium restriction and increased consumption of fresh fruit, vegetables, and low fat dairy, alcohol moderation, and smoking cessation.;Monitor prescription use compliance.    Expected Outcomes Short Term: Continued assessment and intervention until BP is < 140/53mm HG in hypertensive participants. < 130/91mm HG in hypertensive participants with diabetes, heart failure or chronic kidney disease.;Long Term: Maintenance of blood pressure at goal levels.    Lipids Yes    Intervention Provide education and support for participant on nutrition & aerobic/resistive exercise along with prescribed medications to achieve LDL 70mg , HDL >40mg .    Expected Outcomes Short Term: Participant states understanding of desired cholesterol values and is compliant with medications prescribed. Participant is following exercise prescription and nutrition guidelines.;Long Term: Cholesterol controlled with medications as  prescribed, with individualized exercise RX and with personalized nutrition plan. Value goals: LDL < 70mg , HDL > 40 mg.           Tobacco Use Initial Evaluation: Social History   Tobacco Use  Smoking Status Current Some Day Smoker  . Packs/day: 0.25  . Years: 20.00  . Pack years: 5.00  . Types: Cigars, Pipe  Smokeless Tobacco Former Systems developer  . Types: Chew  Tobacco Comment   Smokes about 2 cigars a week. He has no intentions of quitting cigar usage.12/23/2019    Exercise Goals and Review:  Exercise Goals    Row Name 01/04/20 1545             Exercise Goals   Increase Physical Activity Yes       Intervention Provide advice, education, support and counseling about physical activity/exercise needs.;Develop an individualized exercise prescription for aerobic and resistive training based on initial evaluation findings, risk stratification, comorbidities and participant's personal goals.       Expected Outcomes Short Term: Attend rehab on a regular basis to increase amount of physical activity.;Long Term: Add in home exercise to make exercise part of routine and to increase amount of physical activity.;Long Term: Exercising regularly at least 3-5 days a week.  Increase Strength and Stamina Yes       Intervention Provide advice, education, support and counseling about physical activity/exercise needs.;Develop an individualized exercise prescription for aerobic and resistive training based on initial evaluation findings, risk stratification, comorbidities and participant's personal goals.       Expected Outcomes Short Term: Increase workloads from initial exercise prescription for resistance, speed, and METs.;Short Term: Perform resistance training exercises routinely during rehab and add in resistance training at home;Long Term: Improve cardiorespiratory fitness, muscular endurance and strength as measured by increased METs and functional capacity (6MWT)       Able to understand and use  rate of perceived exertion (RPE) scale Yes       Intervention Provide education and explanation on how to use RPE scale       Expected Outcomes Short Term: Able to use RPE daily in rehab to express subjective intensity level;Long Term:  Able to use RPE to guide intensity level when exercising independently       Able to understand and use Dyspnea scale Yes       Intervention Provide education and explanation on how to use Dyspnea scale       Expected Outcomes Short Term: Able to use Dyspnea scale daily in rehab to express subjective sense of shortness of breath during exertion;Long Term: Able to use Dyspnea scale to guide intensity level when exercising independently       Knowledge and understanding of Target Heart Rate Range (THRR) Yes       Intervention Provide education and explanation of THRR including how the numbers were predicted and where they are located for reference       Expected Outcomes Short Term: Able to state/look up THRR;Short Term: Able to use daily as guideline for intensity in rehab;Long Term: Able to use THRR to govern intensity when exercising independently       Able to check pulse independently Yes       Intervention Provide education and demonstration on how to check pulse in carotid and radial arteries.;Review the importance of being able to check your own pulse for safety during independent exercise       Expected Outcomes Short Term: Able to explain why pulse checking is important during independent exercise;Long Term: Able to check pulse independently and accurately       Understanding of Exercise Prescription Yes       Intervention Provide education, explanation, and written materials on patient's individual exercise prescription       Expected Outcomes Short Term: Able to explain program exercise prescription;Long Term: Able to explain home exercise prescription to exercise independently              Copy of goals given to participant.

## 2020-01-04 NOTE — Progress Notes (Addendum)
Pulmonary Individual Treatment Plan  Patient Details  Name: Nicholas Douglas MRN: [id number] Date of Birth: 1931-05-29 Referring Provider:     Pulmonary Rehab from 01/04/2020 in [facility] Cardiac and Pulmonary Rehab  Referring Provider [name] MD      Initial Encounter Date:    Pulmonary Rehab from 01/04/2020 in [facility] Cardiac and Pulmonary Rehab  Date 01/04/20      Visit Diagnosis: Restrictive lung disease  Patient's Home Medications on Admission:  Current Outpatient Medications:  .  acetaminophen (TYLENOL) 500 MG tablet, Take 500 mg by mouth every 6 (six) hours as needed (for pain.). , Disp: , Rfl:  .  amLODipine (NORVASC) 10 MG tablet, Take 1 tablet (10 mg total) by mouth every evening., Disp: 90 tablet, Rfl: 3 .  aspirin 81 MG tablet, Take 81 mg by mouth every evening. , Disp: , Rfl:  .  atenolol (TENORMIN) 25 MG tablet, Take 0.5 tablets (12.5 mg total) by mouth 2 (two) times daily., Disp: 90 tablet, Rfl: 3 .  Carboxymethylcell-Hypromellose (GENTEAL OP), Apply 1 drop to eye 2 (two) times daily. , Disp: , Rfl:  .  clopidogrel (PLAVIX) 75 MG tablet, Take 1 tablet (75 mg total) by mouth daily., Disp: 90 tablet, Rfl: 3 .  ezetimibe (ZETIA) 10 MG tablet, TAKE 1 TABLET DAILY, Disp: 90 tablet, Rfl: 3 .  finasteride (PROSCAR) 5 MG tablet, Take 1 tablet (5 mg total) by mouth daily. (Patient taking differently: Take 2.5 mg by mouth daily. ), Disp: 90 tablet, Rfl: 3 .  icosapent Ethyl (VASCEPA) 1 g capsule, Take 1 capsule (1 g total) by mouth 2 (two) times daily., Disp: 180 capsule, Rfl: 2 .  isosorbide mononitrate (IMDUR) 60 MG 24 hr tablet, Take 1 tablet (60 mg total) by mouth daily., Disp: 90 tablet, Rfl: 2 .  nitroGLYCERIN (NITROSTAT) 0.4 MG SL tablet, PLACE 1 TABLET (0.4 MG TOTAL) UNDER THE TONGUE EVERY 5 (FIVE) MINUTES AS NEEDED FOR CHEST PAIN., Disp: 25 tablet, Rfl: 11 .  Pitavastatin Calcium (LIVALO) 4 MG TABS, Take 1 tablet (4 mg total) by mouth every evening., Disp: 90 tablet, Rfl:  3 .  spironolactone (ALDACTONE) 25 MG tablet, Take 0.5 tablets (12.5 mg total) by mouth daily., Disp: 45 tablet, Rfl: 3 .  traZODone (DESYREL) 50 MG tablet, , Disp: , Rfl:   Past Medical History: Past Medical History:  Diagnosis Date  . BPH (benign prostatic hyperplasia)    Followed by [name]/urology  . CAD S/P PTCA only of RPAV-PL 05/31/2015   99% --> 0%PAV - PTCA only (unable to advance STENT) due to RCA calcification - prox & distal RCA 30%; Patent LAD stent & Cx stent (~20% ISR).   . Calculus of kidney    "that's how they found the cancer" (04/27/2013)  . Coronary artery disease involving native coronary artery of native heart with unstable angina pectoris ([place]) 12/03/2011   S/P Cardiac angioplasty 1986, 1991, 1993, 1997.  Last cardiac catheterization 2001.  Cardiolite 05/2011 low risk with normal EF 65%.  Followed by cardiology/[name] every six months.   . History of myocardial infarction 1976  . Hypertension   . Impotence of organic origin   . Internal hemorrhoids without mention of complication   . Malignant neoplasm of kidney, except pelvis   . Melanoma ([place]) 06/2010   Left arm  . 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  . Other abnormal blood chemistry   . Other nonspecific abnormal serum enzyme  levels   . Personal history of colonic polyps   . Pure hypercholesterolemia   . Tobacco use disorder   . Unspecified congenital cystic kidney disease   . Unstable angina ([place]) 04/05/2013; 05/2015    Tobacco Use: Social History   Tobacco Use  Smoking Status Current Some Day Smoker  . Packs/day: 0.25  . Years: 20.00  . Pack years: 5.00  . Types: Cigars, Pipe  Smokeless Tobacco Former Systems developer  . Types: Chew  Tobacco Comment   Smokes about 2 cigars a week. He has no intentions of quitting cigar usage.12/23/2019    Sabrina is a current tobacco user. Intervention for tobacco cessation was provided at the initial medical review. He was asked about  readiness to quit and reported that he is not interested. Patient smokes occasional cigars in a social setting, especially when he golfs. He sometimes smokes between 2-3 cigars/ week and someone times 0 another week. Patient was advised and educated about tobacco cessation using combination therapy, tobacco cessation classes, quit line, and quit smoking apps. Patient demonstrated understanding of this material. Staff will continue to provide encouragement and follow up with the patient throughout the program.      Labs: Recent Review Flowsheet Data    Labs for [facility] Cardiac and Pulmonary Rehab Latest Ref Rng & Units 11/07/2017 11/18/2017 05/25/2018 05/13/2019 11/23/2019   Cholestrol 100 - 199 mg/dL 148 124 146 132 152   LDLCALC 0 - 99 mg/dL 66 57 72 62 78   HDL >39 mg/dL 57 51 52 49 52   Trlycerides 0 - 149 mg/dL 124 82 109 116 122   Hemoglobin A1c <5.7 % - - - - -       Pulmonary Assessment Scores:  Pulmonary Assessment Scores    Row Name 01/04/20 1549         ADL UCSD   ADL Phase Entry     SOB Score total 30     Rest 0     Walk 2     Stairs 3     Bath 2     Dress 0     Shop 2       CAT Score   CAT Score 15       mMRC Score   mMRC Score 1            UCSD: Self-administered rating of dyspnea associated with activities of daily living (ADLs) 6-point scale (0 = "not at all" to 5 = "maximal or unable to do because of breathlessness")  Scoring Scores range from 0 to 120.  Minimally important difference is 5 units  CAT: CAT can identify the health impairment of COPD patients and is better correlated with disease progression.  CAT has a scoring range of zero to 40. The CAT score is classified into four groups of low (less than 10), medium (10 - 20), high (21-30) and very high (31-40) based on the impact level of disease on health status. A CAT score over 10 suggests significant symptoms.  A worsening CAT score could be explained by an exacerbation, poor medication adherence, poor  inhaler technique, or progression of COPD or comorbid conditions.  CAT MCID is 2 points  mMRC: mMRC (Modified Medical Research Council) Dyspnea Scale is used to assess the degree of baseline functional disability in patients of respiratory disease due to dyspnea. No minimal important difference is established. A decrease in score of 1 point or greater is considered a positive change.   Pulmonary Function  Assessment:  Pulmonary Function Assessment - 12/23/19 0901      Breath   Shortness of Breath No           Exercise Target Goals: Exercise Program Goal: Individual exercise prescription set using results from initial 6 min walk test and THRR while considering  patient's activity barriers and safety.   Exercise Prescription Goal: Initial exercise prescription builds to 30-45 minutes a day of aerobic activity, 2-3 days per week.  Home exercise guidelines will be given to patient during program as part of exercise prescription that the participant will acknowledge.  Education: Aerobic Exercise: - Group verbal and visual presentation on the components of exercise prescription. Introduces F.I.T.T principle from ACSM for exercise prescriptions.  Reviews F.I.T.T. principles of aerobic exercise including progression. Written material given at graduation.   Education: Resistance Exercise: - Group verbal and visual presentation on the components of exercise prescription. Introduces F.I.T.T principle from ACSM for exercise prescriptions  Reviews F.I.T.T. principles of resistance exercise including progression. Written material given at graduation.    Education: Exercise & Equipment Safety: - Individual verbal instruction and demonstration of equipment use and safety with use of the equipment.   Pulmonary Rehab from 12/23/2019 in [facility] Cardiac and Pulmonary Rehab  Date 12/23/19  Educator [facility]  Instruction Review Code 1- Verbalizes Understanding      Education: Exercise Physiology & General  Exercise Guidelines: - Group verbal and written instruction with models to review the exercise physiology of the cardiovascular system and associated critical values. Provides general exercise guidelines with specific guidelines to those with heart or lung disease.    Education: Flexibility, Balance, Mind/Body Relaxation: - Group verbal and visual presentation with interactive activity on the components of exercise prescription. Introduces F.I.T.T principle from ACSM for exercise prescriptions. Reviews F.I.T.T. principles of flexibility and balance exercise training including progression. Also discusses the mind body connection.  Reviews various relaxation techniques to help reduce and manage stress (i.e. Deep breathing, progressive muscle relaxation, and visualization). Balance handout provided to take home. Written material given at graduation.   Activity Barriers & Risk Stratification:  Activity Barriers & Cardiac Risk Stratification - 01/04/20 1546      Activity Barriers & Cardiac Risk Stratification   Activity Barriers Shortness of Breath;Joint Problems;Deconditioning           6 Minute Walk:  6 Minute Walk    Row Name 01/04/20 1536         6 Minute Walk   Phase Initial     Distance 1392 feet     Walk Time 6 minutes     # of Rest Breaks 0     MPH 2.63     METS 2.25     RPE 11     Perceived Dyspnea  1     VO2 Peak 7.79     Symptoms No     Resting HR 62 bpm     Resting BP 116/64     Resting Oxygen Saturation  95 %     Exercise Oxygen Saturation  during 6 min walk 85 %     Max Ex. HR 98 bpm     Max Ex. BP 156/70     2 Minute Post BP 124/64       Interval HR   1 Minute HR 78     2 Minute HR 91     3 Minute HR 94     4 Minute HR 95     5 Minute HR  95     6 Minute HR 98     2 Minute Post HR 71     Interval Heart Rate? Yes       Interval Oxygen   Interval Oxygen? Yes     Baseline Oxygen Saturation % 95 %     1 Minute Oxygen Saturation % 91 %     1 Minute Liters  of Oxygen 0 L  RA     2 Minute Oxygen Saturation % 88 %     2 Minute Liters of Oxygen 0 L     3 Minute Oxygen Saturation % 86 %     3 Minute Liters of Oxygen 0 L     4 Minute Oxygen Saturation % 85 %     4 Minute Liters of Oxygen 0 L     5 Minute Oxygen Saturation % 86 %     5 Minute Liters of Oxygen 0 L     6 Minute Oxygen Saturation % 86 %     6 Minute Liters of Oxygen 0 L     2 Minute Post Oxygen Saturation % 94 %     2 Minute Post Liters of Oxygen 0 L           Oxygen Initial Assessment:  Oxygen Initial Assessment - 01/04/20 1549      Home Oxygen   Home Oxygen Device None    Sleep Oxygen Prescription None    Home Exercise Oxygen Prescription None    Home Resting Oxygen Prescription None    Compliance with Home Oxygen Use Yes      Initial 6 min Walk   Oxygen Used None      Program Oxygen Prescription   Program Oxygen Prescription None      Intervention   Short Term Goals To learn and understand importance of monitoring SPO2 with pulse oximeter and demonstrate accurate use of the pulse oximeter.;To learn and understand importance of maintaining oxygen saturations>88%;To learn and demonstrate proper pursed lip breathing techniques or other breathing techniques.;To learn and demonstrate proper use of respiratory medications    Long  Term Goals Maintenance of O2 saturations>88%;Verbalizes importance of monitoring SPO2 with pulse oximeter and return demonstration;Exhibits proper breathing techniques, such as pursed lip breathing or other method taught during program session;Demonstrates proper use of MDI's;Compliance with respiratory medication           Oxygen Re-Evaluation:   Oxygen Discharge (Final Oxygen Re-Evaluation):   Initial Exercise Prescription:  Initial Exercise Prescription - 01/04/20 1500      Date of Initial Exercise RX and Referring Provider   Date 01/04/20    Referring Provider [name] MD      Treadmill   MPH 2.2    Grade 0    Minutes  15    METs 2.68      NuStep   Level 2    SPM 80    Minutes 15    METs 2.2      REL-XR   Level 2    Speed 50    Minutes 15    METs 2.2      Prescription Details   Frequency (times per week) 2    Duration Progress to 30 minutes of continuous aerobic without signs/symptoms of physical distress      Intensity   THRR 40-80% of Max Heartrate 90-119    Ratings of Perceived Exertion 11-13    Perceived Dyspnea 0-4      Progression  Progression Continue to progress workloads to maintain intensity without signs/symptoms of physical distress.      Resistance Training   Training Prescription Yes    Weight 3 lb    Reps 10-15           Perform Capillary Blood Glucose checks as needed.  Exercise Prescription Changes:  Exercise Prescription Changes    Row Name 01/04/20 1500             Response to Exercise   Blood Pressure (Admit) 116/64       Blood Pressure (Exercise) 156/70       Blood Pressure (Exit) 124/64       Heart Rate (Admit) 62 bpm       Heart Rate (Exercise) 98 bpm       Heart Rate (Exit) 62 bpm       Oxygen Saturation (Admit) 95 %       Oxygen Saturation (Exercise) 85 %       Oxygen Saturation (Exit) 95 %       Rating of Perceived Exertion (Exercise) 11       Perceived Dyspnea (Exercise) 1       Symptoms none       Comments walk test results              Exercise Comments:   Exercise Goals and Review:  Exercise Goals    Row Name 01/04/20 1545             Exercise Goals   Increase Physical Activity Yes       Intervention Provide advice, education, support and counseling about physical activity/exercise needs.;Develop an individualized exercise prescription for aerobic and resistive training based on initial evaluation findings, risk stratification, comorbidities and participant's personal goals.       Expected Outcomes Short Term: Attend rehab on a regular basis to increase amount of physical activity.;Long Term: Add in home exercise to make  exercise part of routine and to increase amount of physical activity.;Long Term: Exercising regularly at least 3-5 days a week.       Increase Strength and Stamina Yes       Intervention Provide advice, education, support and counseling about physical activity/exercise needs.;Develop an individualized exercise prescription for aerobic and resistive training based on initial evaluation findings, risk stratification, comorbidities and participant's personal goals.       Expected Outcomes Short Term: Increase workloads from initial exercise prescription for resistance, speed, and METs.;Short Term: Perform resistance training exercises routinely during rehab and add in resistance training at home;Long Term: Improve cardiorespiratory fitness, muscular endurance and strength as measured by increased METs and functional capacity (6MWT)       Able to understand and use rate of perceived exertion (RPE) scale Yes       Intervention Provide education and explanation on how to use RPE scale       Expected Outcomes Short Term: Able to use RPE daily in rehab to express subjective intensity level;Long Term:  Able to use RPE to guide intensity level when exercising independently       Able to understand and use Dyspnea scale Yes       Intervention Provide education and explanation on how to use Dyspnea scale       Expected Outcomes Short Term: Able to use Dyspnea scale daily in rehab to express subjective sense of shortness of breath during exertion;Long Term: Able to use Dyspnea scale to guide intensity level when exercising independently  Knowledge and understanding of Target Heart Rate Range (THRR) Yes       Intervention Provide education and explanation of THRR including how the numbers were predicted and where they are located for reference       Expected Outcomes Short Term: Able to state/look up THRR;Short Term: Able to use daily as guideline for intensity in rehab;Long Term: Able to use THRR to govern  intensity when exercising independently       Able to check pulse independently Yes       Intervention Provide education and demonstration on how to check pulse in carotid and radial arteries.;Review the importance of being able to check your own pulse for safety during independent exercise       Expected Outcomes Short Term: Able to explain why pulse checking is important during independent exercise;Long Term: Able to check pulse independently and accurately       Understanding of Exercise Prescription Yes       Intervention Provide education, explanation, and written materials on patient's individual exercise prescription       Expected Outcomes Short Term: Able to explain program exercise prescription;Long Term: Able to explain home exercise prescription to exercise independently              Exercise Goals Re-Evaluation :   Discharge Exercise Prescription (Final Exercise Prescription Changes):  Exercise Prescription Changes - 01/04/20 1500      Response to Exercise   Blood Pressure (Admit) 116/64    Blood Pressure (Exercise) 156/70    Blood Pressure (Exit) 124/64    Heart Rate (Admit) 62 bpm    Heart Rate (Exercise) 98 bpm    Heart Rate (Exit) 62 bpm    Oxygen Saturation (Admit) 95 %    Oxygen Saturation (Exercise) 85 %    Oxygen Saturation (Exit) 95 %    Rating of Perceived Exertion (Exercise) 11    Perceived Dyspnea (Exercise) 1    Symptoms none    Comments walk test results           Nutrition:  Target Goals: Understanding of nutrition guidelines, daily intake of sodium 1500mg , cholesterol 200mg , calories 30% from fat and 7% or less from saturated fats, daily to have 5 or more servings of fruits and vegetables.  Education: All About Nutrition: -Group instruction provided by verbal, written material, interactive activities, discussions, models, and posters to present general guidelines for heart healthy nutrition including fat, fiber, MyPlate, the role of sodium in  heart healthy nutrition, utilization of the nutrition label, and utilization of this knowledge for meal planning. Follow up email sent as well. Written material given at graduation.   Biometrics:  Pre Biometrics - 01/04/20 1546      Pre Biometrics   Height 5' 7.5" (1.715 m)    Weight 192 lb 11.2 oz (87.4 kg)    BMI (Calculated) 29.72    Single Leg Stand 4.94 seconds            Nutrition Therapy Plan and Nutrition Goals:   Nutrition Assessments:  MEDIFICTS Score Key:  ?70 Need to make dietary changes   40-70 Heart Healthy Diet  ? 40 Therapeutic Level Cholesterol Diet   Picture Your Plate Scores:  <09 Unhealthy dietary pattern with much room for improvement.  41-50 Dietary pattern unlikely to meet recommendations for good health and room for improvement.  51-60 More healthful dietary pattern, with some room for improvement.   >60 Healthy dietary pattern, although there may be some specific behaviors  that could be improved.   Nutrition Goals Re-Evaluation:   Nutrition Goals Discharge (Final Nutrition Goals Re-Evaluation):   Psychosocial: Target Goals: Acknowledge presence or absence of significant depression and/or stress, maximize coping skills, provide positive support system. Participant is able to verbalize types and ability to use techniques and skills needed for reducing stress and depression.   Education: Stress, Anxiety, and Depression - Group verbal and visual presentation to define topics covered.  Reviews how body is impacted by stress, anxiety, and depression.  Also discusses healthy ways to reduce stress and to treat/manage anxiety and depression.  Written material given at graduation.   Education: Sleep Hygiene -Provides group verbal and written instruction about how sleep can affect your health.  Define sleep hygiene, discuss sleep cycles and impact of sleep habits. Review good sleep hygiene tips.    Initial Review & Psychosocial Screening:   Initial Psych Review & Screening - 12/23/19 0904      Initial Review   Current issues with None Identified      Family Dynamics   Good Support System? Yes    Comments He can look to his wife for support and has no concerns at this time.      Barriers   Psychosocial barriers to participate in program There are no identifiable barriers or psychosocial needs.;The patient should benefit from training in stress management and relaxation.      Screening Interventions   Interventions Encouraged to exercise;To provide support and resources with identified psychosocial needs;Provide feedback about the scores to participant    Expected Outcomes Short Term goal: Utilizing psychosocial counselor, staff and physician to assist with identification of specific Stressors or current issues interfering with healing process. Setting desired goal for each stressor or current issue identified.;Long Term Goal: Stressors or current issues are controlled or eliminated.;Short Term goal: Identification and review with participant of any Quality of Life or Depression concerns found by scoring the questionnaire.;Long Term goal: The participant improves quality of Life and PHQ9 Scores as seen by post scores and/or verbalization of changes           Quality of Life Scores:  Scores of 19 and below usually indicate a poorer quality of life in these areas.  A difference of  2-3 points is a clinically meaningful difference.  A difference of 2-3 points in the total score of the Quality of Life Index has been associated with significant improvement in overall quality of life, self-image, physical symptoms, and general health in studies assessing change in quality of life.  PHQ-9: Recent Review Flowsheet Data    Depression screen [facility] 2/9 01/04/2020 04/20/2015 12/22/2013   Decreased Interest 0 0 0   Down, Depressed, Hopeless 0 0 0   PHQ - 2 Score 0 0 0   Altered sleeping 0 - -   Tired, decreased energy 2 - -   Change in  appetite 0 - -   Feeling bad or failure about yourself  0 - -   Trouble concentrating 0 - -   Moving slowly or fidgety/restless 0 - -   Suicidal thoughts 0 - -   PHQ-9 Score 2 - -   Difficult doing work/chores Not difficult at all - -     Interpretation of Total Score  Total Score Depression Severity:  1-4 = Minimal depression, 5-9 = Mild depression, 10-14 = Moderate depression, 15-19 = Moderately severe depression, 20-27 = Severe depression   Psychosocial Evaluation and Intervention:  Psychosocial Evaluation - 12/23/19 8811  Psychosocial Evaluation & Interventions   Interventions Relaxation education;Stress management education;Encouraged to exercise with the program and follow exercise prescription    Comments He can look to his wife for support and has no concerns at this time.    Expected Outcomes Short: Exercise regularly to support mental health and notify staff of any changes. Long: maintain mental health and well being through teaching of rehab or prescribed medications independently.    Continue Psychosocial Services  Follow up required by staff           Psychosocial Re-Evaluation:   Psychosocial Discharge (Final Psychosocial Re-Evaluation):   Education: Education Goals: Education classes will be provided on a weekly basis, covering required topics. Participant will state understanding/return demonstration of topics presented.  Learning Barriers/Preferences:  Learning Barriers/Preferences - 12/23/19 0903      Learning Barriers/Preferences   Learning Barriers None    Learning Preferences None           General Pulmonary Education Topics:  Infection Prevention: - Provides verbal and written material to individual with discussion of infection control including proper hand washing and proper equipment cleaning during exercise session.   Pulmonary Rehab from 12/23/2019 in [facility] Cardiac and Pulmonary Rehab  Date 12/23/19  Educator [facility]  Instruction Review  Code 1- Verbalizes Understanding      Falls Prevention: - Provides verbal and written material to individual with discussion of falls prevention and safety.   Pulmonary Rehab from 12/23/2019 in [facility] Cardiac and Pulmonary Rehab  Date 12/23/19  Educator [facility]  Instruction Review Code 1- Verbalizes Understanding      Chronic Lung Disease Review: - Group verbal instruction with posters, models, PowerPoint presentations and videos,  to review new updates, new respiratory medications, new advancements in procedures and treatments. Providing information on websites and "800" numbers for continued self-education. Includes information about supplement oxygen, available portable oxygen systems, continuous and intermittent flow rates, oxygen safety, concentrators, and Medicare reimbursement for oxygen. Explanation of Pulmonary Drugs, including class, frequency, complications, importance of spacers, rinsing mouth after steroid MDI's, and proper cleaning methods for nebulizers. Review of basic lung anatomy and physiology related to function, structure, and complications of lung disease. Review of risk factors. Discussion about methods for diagnosing sleep apnea and types of masks and machines for OSA. Includes a review of the use of types of environmental controls: home humidity, furnaces, filters, dust mite/pet prevention, HEPA vacuums. Discussion about weather changes, air quality and the benefits of nasal washing. Instruction on Warning signs, infection symptoms, calling MD promptly, preventive modes, and value of vaccinations. Review of effective airway clearance, coughing and/or vibration techniques. Emphasizing that all should Create an Action Plan. Written material given at graduation.   AED/CPR: - Group verbal and written instruction with the use of models to demonstrate the basic use of the AED with the basic ABC's of resuscitation.    Anatomy and Cardiac Procedures: - Group verbal and visual  presentation and models provide information about basic cardiac anatomy and function. Reviews the testing methods done to diagnose heart disease and the outcomes of the test results. Describes the treatment choices: Medical Management, Angioplasty, or Coronary Bypass Surgery for treating various heart conditions including Myocardial Infarction, Angina, Valve Disease, and Cardiac Arrhythmias.  Written material given at graduation.   Medication Safety: - Group verbal and visual instruction to review commonly prescribed medications for heart and lung disease. Reviews the medication, class of the drug, and side effects. Includes the steps to properly store meds and maintain the  prescription regimen.  Written material given at graduation.   Other: -Provides group and verbal instruction on various topics (see comments)   Knowledge Questionnaire Score:  Knowledge Questionnaire Score - 01/04/20 1548      Knowledge Questionnaire Score   Pre Score 14/18: Oxygen, Medications            Core Components/Risk Factors/Patient Goals at Admission:  Personal Goals and Risk Factors at Admission - 01/04/20 1547      Core Components/Risk Factors/Patient Goals on Admission    Weight Management Yes;Weight Loss    Intervention Weight Management: Provide education and appropriate resources to help participant work on and attain dietary goals.;Weight Management: Develop a combined nutrition and exercise program designed to reach desired caloric intake, while maintaining appropriate intake of nutrient and fiber, sodium and fats, and appropriate energy expenditure required for the weight goal.;Weight Management/Obesity: Establish reasonable short term and long term weight goals.    Admit Weight 192 lb (87.1 kg)    Goal Weight: Short Term 187 lb (84.8 kg)    Goal Weight: Long Term 182 lb (82.6 kg)    Expected Outcomes Short Term: Continue to assess and modify interventions until short term weight is achieved;Long  Term: Adherence to nutrition and physical activity/exercise program aimed toward attainment of established weight goal;Weight Loss: Understanding of general recommendations for a balanced deficit meal plan, which promotes 1-2 lb weight loss per week and includes a negative energy balance of [phone] kcal/d;Understanding recommendations for meals to include 15-35% energy as protein, 25-35% energy from fat, 35-60% energy from carbohydrates, less than 200mg  of dietary cholesterol, 20-35 gm of total fiber daily;Understanding of distribution of calorie intake throughout the day with the consumption of 4-5 meals/snacks    Tobacco Cessation Yes    Number of packs per day Smokes cigars- socially    Intervention Assist the participant in steps to quit. Provide individualized education and counseling about committing to Tobacco Cessation, relapse prevention, and pharmacological support that can be provided by physician.;Advice worker, assist with locating and accessing local/national Quit Smoking programs, and support quit date choice.    Expected Outcomes Short Term: Will demonstrate readiness to quit, by selecting a quit date.;Short Term: Will quit all tobacco product use, adhering to prevention of relapse plan.;Long Term: Complete abstinence from all tobacco products for at least 12 months from quit date.    Improve shortness of breath with ADL's Yes    Intervention Provide education, individualized exercise plan and daily activity instruction to help decrease symptoms of SOB with activities of daily living.    Expected Outcomes Short Term: Improve cardiorespiratory fitness to achieve a reduction of symptoms when performing ADLs;Long Term: Be able to perform more ADLs without symptoms or delay the onset of symptoms    Hypertension Yes    Intervention Provide education on lifestyle modifcations including regular physical activity/exercise, weight management, moderate sodium restriction and increased  consumption of fresh fruit, vegetables, and low fat dairy, alcohol moderation, and smoking cessation.;Monitor prescription use compliance.    Expected Outcomes Short Term: Continued assessment and intervention until BP is < 140/63mm HG in hypertensive participants. < 130/10mm HG in hypertensive participants with diabetes, heart failure or chronic kidney disease.;Long Term: Maintenance of blood pressure at goal levels.    Lipids Yes    Intervention Provide education and support for participant on nutrition & aerobic/resistive exercise along with prescribed medications to achieve LDL 70mg , HDL >40mg .    Expected Outcomes Short Term: Participant states understanding of desired  cholesterol values and is compliant with medications prescribed. Participant is following exercise prescription and nutrition guidelines.;Long Term: Cholesterol controlled with medications as prescribed, with individualized exercise RX and with personalized nutrition plan. Value goals: LDL < 70mg , HDL > 40 mg.           Education:Diabetes - Individual verbal and written instruction to review signs/symptoms of diabetes, desired ranges of glucose level fasting, after meals and with exercise. Acknowledge that pre and post exercise glucose checks will be done for 3 sessions at entry of program.   Know Your Numbers and Heart Failure: - Group verbal and visual instruction to discuss disease risk factors for cardiac and pulmonary disease and treatment options.  Reviews associated critical values for Overweight/Obesity, Hypertension, Cholesterol, and Diabetes.  Discusses basics of heart failure: signs/symptoms and treatments.  Introduces Heart Failure Zone chart for action plan for heart failure.  Written material given at graduation.   Core Components/Risk Factors/Patient Goals Review:    Core Components/Risk Factors/Patient Goals at Discharge (Final Review):    ITP Comments:  ITP Comments    Row Name 12/23/19 0909 01/04/20  1531         ITP Comments Virtual Visit completed. Patient informed on EP and RD appointment and 6 Minute walk test. Patient also informed of patient health questionnaires on My Chart. Patient Verbalizes understanding. Visit diagnosis can be found in [facility] 11/16/2019. Completed 6MWT and gym orientation. Initial ITP created and sent for review to Dr. Emily Filbert, Medical Director.             Comments: Initial ITP

## 2020-01-05 ENCOUNTER — Encounter: Payer: Self-pay | Admitting: *Deleted

## 2020-01-05 DIAGNOSIS — J984 Other disorders of lung: Secondary | ICD-10-CM

## 2020-01-05 NOTE — Progress Notes (Signed)
Pulmonary Individual Treatment Plan  Patient Details  Name: Nicholas Douglas MRN: [id number] Date of Birth: [birth year] Referring Provider:     Pulmonary Rehab from 01/04/2020 in [facility] Cardiac and Pulmonary Rehab  Referring Provider [name] MD      Initial Encounter Date:    Pulmonary Rehab from 01/04/2020 in [facility] Cardiac and Pulmonary Rehab  Date 01/04/20      Visit Diagnosis: Restrictive lung disease  Patient's Home Medications on Admission:  Current Outpatient Medications:  .  acetaminophen (TYLENOL) 500 MG tablet, Take 500 mg by mouth every 6 (six) hours as needed (for pain.). , Disp: , Rfl:  .  amLODipine (NORVASC) 10 MG tablet, Take 1 tablet (10 mg total) by mouth every evening., Disp: 90 tablet, Rfl: 3 .  aspirin 81 MG tablet, Take 81 mg by mouth every evening. , Disp: , Rfl:  .  atenolol (TENORMIN) 25 MG tablet, Take 0.5 tablets (12.5 mg total) by mouth 2 (two) times daily., Disp: 90 tablet, Rfl: 3 .  Carboxymethylcell-Hypromellose (GENTEAL OP), Apply 1 drop to eye 2 (two) times daily. , Disp: , Rfl:  .  clopidogrel (PLAVIX) 75 MG tablet, Take 1 tablet (75 mg total) by mouth daily., Disp: 90 tablet, Rfl: 3 .  ezetimibe (ZETIA) 10 MG tablet, TAKE 1 TABLET DAILY, Disp: 90 tablet, Rfl: 3 .  finasteride (PROSCAR) 5 MG tablet, Take 1 tablet (5 mg total) by mouth daily. (Patient taking differently: Take 2.5 mg by mouth daily. ), Disp: 90 tablet, Rfl: 3 .  icosapent Ethyl (VASCEPA) 1 g capsule, Take 1 capsule (1 g total) by mouth 2 (two) times daily., Disp: 180 capsule, Rfl: 2 .  isosorbide mononitrate (IMDUR) 60 MG 24 hr tablet, Take 1 tablet (60 mg total) by mouth daily., Disp: 90 tablet, Rfl: 2 .  nitroGLYCERIN (NITROSTAT) 0.4 MG SL tablet, PLACE 1 TABLET (0.4 MG TOTAL) UNDER THE TONGUE EVERY 5 (FIVE) MINUTES AS NEEDED FOR CHEST PAIN., Disp: 25 tablet, Rfl: 11 .  Pitavastatin Calcium (LIVALO) 4 MG TABS, Take 1 tablet (4 mg total) by mouth every evening., Disp: 90 tablet, Rfl:  3 .  spironolactone (ALDACTONE) 25 MG tablet, Take 0.5 tablets (12.5 mg total) by mouth daily., Disp: 45 tablet, Rfl: 3 .  traZODone (DESYREL) 50 MG tablet, , Disp: , Rfl:   Past Medical History: Past Medical History:  Diagnosis Date  . BPH (benign prostatic hyperplasia)    Followed by [name]/urology  . CAD S/P PTCA only of RPAV-PL 05/31/2015   99% --> 0%PAV - PTCA only (unable to advance STENT) due to RCA calcification - prox & distal RCA 30%; Patent LAD stent & Cx stent (~20% ISR).   . Calculus of kidney    "that's how they found the cancer" (04/27/2013)  . Coronary artery disease involving native coronary artery of native heart with unstable angina pectoris ([place]) 12/03/2011   S/P Cardiac angioplasty 1986, 1991, 1993, 1997.  Last cardiac catheterization 2001.  Cardiolite 05/2011 low risk with normal EF 65%.  Followed by cardiology/[name] every six months.   . History of myocardial infarction 1976  . Hypertension   . Impotence of organic origin   . Internal hemorrhoids without mention of complication   . Malignant neoplasm of kidney, except pelvis   . Melanoma ([place]) 06/2010   Left arm  . Melanoma of back ([place]) 02/07/2015   R upper back; excision [facility].  . Other abnormal blood chemistry   . Other nonspecific abnormal serum enzyme  levels   . Personal history of colonic polyps   . Pure hypercholesterolemia   . Tobacco use disorder   . Unspecified congenital cystic kidney disease   . Unstable angina ([place]) 04/05/2013; 05/2015    Tobacco Use: Social History   Tobacco Use  Smoking Status Current Some Day Smoker  . Packs/day: 0.25  . Years: 20.00  . Pack years: 5.00  . Types: Cigars, Pipe  Smokeless Tobacco Former Systems developer  . Types: Chew  Tobacco Comment   Smokes about 2 cigars a week. He has no intentions of quitting cigar usage.12/23/2019    Labs: Recent Review Flowsheet Data    Labs for [facility] Cardiac and Pulmonary Rehab Latest Ref Rng & Units 11/07/2017 11/18/2017 05/25/2018  05/13/2019 11/23/2019   Cholestrol 100 - 199 mg/dL 148 124 146 132 152   LDLCALC 0 - 99 mg/dL 66 57 72 62 78   HDL >39 mg/dL 57 51 52 49 52   Trlycerides 0 - 149 mg/dL 124 82 109 116 122   Hemoglobin A1c <5.7 % - - - - -       Pulmonary Assessment Scores:  Pulmonary Assessment Scores    Row Name 01/04/20 1549         ADL UCSD   ADL Phase Entry     SOB Score total 30     Rest 0     Walk 2     Stairs 3     Bath 2     Dress 0     Shop 2       CAT Score   CAT Score 15       mMRC Score   mMRC Score 1            UCSD: Self-administered rating of dyspnea associated with activities of daily living (ADLs) 6-point scale (0 = "not at all" to 5 = "maximal or unable to do because of breathlessness")  Scoring Scores range from 0 to 120.  Minimally important difference is 5 units  CAT: CAT can identify the health impairment of COPD patients and is better correlated with disease progression.  CAT has a scoring range of zero to 40. The CAT score is classified into four groups of low (less than 10), medium (10 - 20), high (21-30) and very high (31-40) based on the impact level of disease on health status. A CAT score over 10 suggests significant symptoms.  A worsening CAT score could be explained by an exacerbation, poor medication adherence, poor inhaler technique, or progression of COPD or comorbid conditions.  CAT MCID is 2 points  mMRC: mMRC (Modified Medical Research Council) Dyspnea Scale is used to assess the degree of baseline functional disability in patients of respiratory disease due to dyspnea. No minimal important difference is established. A decrease in score of 1 point or greater is considered a positive change.   Pulmonary Function Assessment:  Pulmonary Function Assessment - 12/23/19 0901      Breath   Shortness of Breath No           Exercise Target Goals: Exercise Program Goal: Individual exercise prescription set using results from initial 6 min walk test  and THRR while considering  patient's activity barriers and safety.   Exercise Prescription Goal: Initial exercise prescription builds to 30-45 minutes a day of aerobic activity, 2-3 days per week.  Home exercise guidelines will be given to patient during program as part of exercise prescription that the participant will acknowledge.  Education:  Aerobic Exercise: - Group verbal and visual presentation on the components of exercise prescription. Introduces F.I.T.T principle from ACSM for exercise prescriptions.  Reviews F.I.T.T. principles of aerobic exercise including progression. Written material given at graduation.   Education: Resistance Exercise: - Group verbal and visual presentation on the components of exercise prescription. Introduces F.I.T.T principle from ACSM for exercise prescriptions  Reviews F.I.T.T. principles of resistance exercise including progression. Written material given at graduation.    Education: Exercise & Equipment Safety: - Individual verbal instruction and demonstration of equipment use and safety with use of the equipment.   Pulmonary Rehab from 12/23/2019 in [facility] Cardiac and Pulmonary Rehab  Date 12/23/19  Educator [facility]  Instruction Review Code 1- Verbalizes Understanding      Education: Exercise Physiology & General Exercise Guidelines: - Group verbal and written instruction with models to review the exercise physiology of the cardiovascular system and associated critical values. Provides general exercise guidelines with specific guidelines to those with heart or lung disease.    Education: Flexibility, Balance, Mind/Body Relaxation: - Group verbal and visual presentation with interactive activity on the components of exercise prescription. Introduces F.I.T.T principle from ACSM for exercise prescriptions. Reviews F.I.T.T. principles of flexibility and balance exercise training including progression. Also discusses the mind body connection.  Reviews various  relaxation techniques to help reduce and manage stress (i.e. Deep breathing, progressive muscle relaxation, and visualization). Balance handout provided to take home. Written material given at graduation.   Activity Barriers & Risk Stratification:  Activity Barriers & Cardiac Risk Stratification - 01/04/20 1546      Activity Barriers & Cardiac Risk Stratification   Activity Barriers Shortness of Breath;Joint Problems;Deconditioning           6 Minute Walk:  6 Minute Walk    Row Name 01/04/20 1536         6 Minute Walk   Phase Initial     Distance 1392 feet     Walk Time 6 minutes     # of Rest Breaks 0     MPH 2.63     METS 2.25     RPE 11     Perceived Dyspnea  1     VO2 Peak 7.79     Symptoms No     Resting HR 62 bpm     Resting BP 116/64     Resting Oxygen Saturation  95 %     Exercise Oxygen Saturation  during 6 min walk 85 %     Max Ex. HR 98 bpm     Max Ex. BP 156/70     2 Minute Post BP 124/64       Interval HR   1 Minute HR 78     2 Minute HR 91     3 Minute HR 94     4 Minute HR 95     5 Minute HR 95     6 Minute HR 98     2 Minute Post HR 71     Interval Heart Rate? Yes       Interval Oxygen   Interval Oxygen? Yes     Baseline Oxygen Saturation % 95 %     1 Minute Oxygen Saturation % 91 %     1 Minute Liters of Oxygen 0 L  RA     2 Minute Oxygen Saturation % 88 %     2 Minute Liters of Oxygen 0 L     3 Minute Oxygen  Saturation % 86 %     3 Minute Liters of Oxygen 0 L     4 Minute Oxygen Saturation % 85 %     4 Minute Liters of Oxygen 0 L     5 Minute Oxygen Saturation % 86 %     5 Minute Liters of Oxygen 0 L     6 Minute Oxygen Saturation % 86 %     6 Minute Liters of Oxygen 0 L     2 Minute Post Oxygen Saturation % 94 %     2 Minute Post Liters of Oxygen 0 L           Oxygen Initial Assessment:  Oxygen Initial Assessment - 01/04/20 1549      Home Oxygen   Home Oxygen Device None    Sleep Oxygen Prescription None    Home Exercise  Oxygen Prescription None    Home Resting Oxygen Prescription None    Compliance with Home Oxygen Use Yes      Initial 6 min Walk   Oxygen Used None      Program Oxygen Prescription   Program Oxygen Prescription None      Intervention   Short Term Goals To learn and understand importance of monitoring SPO2 with pulse oximeter and demonstrate accurate use of the pulse oximeter.;To learn and understand importance of maintaining oxygen saturations>88%;To learn and demonstrate proper pursed lip breathing techniques or other breathing techniques.;To learn and demonstrate proper use of respiratory medications    Long  Term Goals Maintenance of O2 saturations>88%;Verbalizes importance of monitoring SPO2 with pulse oximeter and return demonstration;Exhibits proper breathing techniques, such as pursed lip breathing or other method taught during program session;Demonstrates proper use of MDI's;Compliance with respiratory medication           Oxygen Re-Evaluation:   Oxygen Discharge (Final Oxygen Re-Evaluation):   Initial Exercise Prescription:  Initial Exercise Prescription - 01/04/20 1500      Date of Initial Exercise RX and Referring Provider   Date 01/04/20    Referring Provider [name] MD      Treadmill   MPH 2.2    Grade 0    Minutes 15    METs 2.68      NuStep   Level 2    SPM 80    Minutes 15    METs 2.2      REL-XR   Level 2    Speed 50    Minutes 15    METs 2.2      Prescription Details   Frequency (times per week) 2    Duration Progress to 30 minutes of continuous aerobic without signs/symptoms of physical distress      Intensity   THRR 40-80% of Max Heartrate 90-119    Ratings of Perceived Exertion 11-13    Perceived Dyspnea 0-4      Progression   Progression Continue to progress workloads to maintain intensity without signs/symptoms of physical distress.      Resistance Training   Training Prescription Yes    Weight 3 lb    Reps 10-15             Perform Capillary Blood Glucose checks as needed.  Exercise Prescription Changes:  Exercise Prescription Changes    Row Name 01/04/20 1500             Response to Exercise   Blood Pressure (Admit) 116/64       Blood Pressure (Exercise) 156/70  Blood Pressure (Exit) 124/64       Heart Rate (Admit) 62 bpm       Heart Rate (Exercise) 98 bpm       Heart Rate (Exit) 62 bpm       Oxygen Saturation (Admit) 95 %       Oxygen Saturation (Exercise) 85 %       Oxygen Saturation (Exit) 95 %       Rating of Perceived Exertion (Exercise) 11       Perceived Dyspnea (Exercise) 1       Symptoms none       Comments walk test results              Exercise Comments:   Exercise Goals and Review:  Exercise Goals    Row Name 01/04/20 1545             Exercise Goals   Increase Physical Activity Yes       Intervention Provide advice, education, support and counseling about physical activity/exercise needs.;Develop an individualized exercise prescription for aerobic and resistive training based on initial evaluation findings, risk stratification, comorbidities and participant's personal goals.       Expected Outcomes Short Term: Attend rehab on a regular basis to increase amount of physical activity.;Long Term: Add in home exercise to make exercise part of routine and to increase amount of physical activity.;Long Term: Exercising regularly at least 3-5 days a week.       Increase Strength and Stamina Yes       Intervention Provide advice, education, support and counseling about physical activity/exercise needs.;Develop an individualized exercise prescription for aerobic and resistive training based on initial evaluation findings, risk stratification, comorbidities and participant's personal goals.       Expected Outcomes Short Term: Increase workloads from initial exercise prescription for resistance, speed, and METs.;Short Term: Perform resistance training exercises routinely  during rehab and add in resistance training at home;Long Term: Improve cardiorespiratory fitness, muscular endurance and strength as measured by increased METs and functional capacity (6MWT)       Able to understand and use rate of perceived exertion (RPE) scale Yes       Intervention Provide education and explanation on how to use RPE scale       Expected Outcomes Short Term: Able to use RPE daily in rehab to express subjective intensity level;Long Term:  Able to use RPE to guide intensity level when exercising independently       Able to understand and use Dyspnea scale Yes       Intervention Provide education and explanation on how to use Dyspnea scale       Expected Outcomes Short Term: Able to use Dyspnea scale daily in rehab to express subjective sense of shortness of breath during exertion;Long Term: Able to use Dyspnea scale to guide intensity level when exercising independently       Knowledge and understanding of Target Heart Rate Range (THRR) Yes       Intervention Provide education and explanation of THRR including how the numbers were predicted and where they are located for reference       Expected Outcomes Short Term: Able to state/look up THRR;Short Term: Able to use daily as guideline for intensity in rehab;Long Term: Able to use THRR to govern intensity when exercising independently       Able to check pulse independently Yes       Intervention Provide education and demonstration on how to check pulse  in carotid and radial arteries.;Review the importance of being able to check your own pulse for safety during independent exercise       Expected Outcomes Short Term: Able to explain why pulse checking is important during independent exercise;Long Term: Able to check pulse independently and accurately       Understanding of Exercise Prescription Yes       Intervention Provide education, explanation, and written materials on patient's individual exercise prescription       Expected  Outcomes Short Term: Able to explain program exercise prescription;Long Term: Able to explain home exercise prescription to exercise independently              Exercise Goals Re-Evaluation :   Discharge Exercise Prescription (Final Exercise Prescription Changes):  Exercise Prescription Changes - 01/04/20 1500      Response to Exercise   Blood Pressure (Admit) 116/64    Blood Pressure (Exercise) 156/70    Blood Pressure (Exit) 124/64    Heart Rate (Admit) 62 bpm    Heart Rate (Exercise) 98 bpm    Heart Rate (Exit) 62 bpm    Oxygen Saturation (Admit) 95 %    Oxygen Saturation (Exercise) 85 %    Oxygen Saturation (Exit) 95 %    Rating of Perceived Exertion (Exercise) 11    Perceived Dyspnea (Exercise) 1    Symptoms none    Comments walk test results           Nutrition:  Target Goals: Understanding of nutrition guidelines, daily intake of sodium 1500mg , cholesterol 200mg , calories 30% from fat and 7% or less from saturated fats, daily to have 5 or more servings of fruits and vegetables.  Education: All About Nutrition: -Group instruction provided by verbal, written material, interactive activities, discussions, models, and posters to present general guidelines for heart healthy nutrition including fat, fiber, MyPlate, the role of sodium in heart healthy nutrition, utilization of the nutrition label, and utilization of this knowledge for meal planning. Follow up email sent as well. Written material given at graduation.   Biometrics:  Pre Biometrics - 01/04/20 1546      Pre Biometrics   Height 5' 7.5" (1.715 m)    Weight 192 lb 11.2 oz (87.4 kg)    BMI (Calculated) 29.72    Single Leg Stand 4.94 seconds            Nutrition Therapy Plan and Nutrition Goals:   Nutrition Assessments:  MEDIFICTS Score Key:  ?70 Need to make dietary changes   40-70 Heart Healthy Diet  ? 40 Therapeutic Level Cholesterol Diet   Picture Your Plate Scores:  <62 Unhealthy  dietary pattern with much room for improvement.  41-50 Dietary pattern unlikely to meet recommendations for good health and room for improvement.  51-60 More healthful dietary pattern, with some room for improvement.   >60 Healthy dietary pattern, although there may be some specific behaviors that could be improved.   Nutrition Goals Re-Evaluation:   Nutrition Goals Discharge (Final Nutrition Goals Re-Evaluation):   Psychosocial: Target Goals: Acknowledge presence or absence of significant depression and/or stress, maximize coping skills, provide positive support system. Participant is able to verbalize types and ability to use techniques and skills needed for reducing stress and depression.   Education: Stress, Anxiety, and Depression - Group verbal and visual presentation to define topics covered.  Reviews how body is impacted by stress, anxiety, and depression.  Also discusses healthy ways to reduce stress and to treat/manage anxiety and depression.  Written material given at graduation.   Education: Sleep Hygiene -Provides group verbal and written instruction about how sleep can affect your health.  Define sleep hygiene, discuss sleep cycles and impact of sleep habits. Review good sleep hygiene tips.    Initial Review & Psychosocial Screening:  Initial Psych Review & Screening - 12/23/19 0904      Initial Review   Current issues with None Identified      Family Dynamics   Good Support System? Yes    Comments He can look to his wife for support and has no concerns at this time.      Barriers   Psychosocial barriers to participate in program There are no identifiable barriers or psychosocial needs.;The patient should benefit from training in stress management and relaxation.      Screening Interventions   Interventions Encouraged to exercise;To provide support and resources with identified psychosocial needs;Provide feedback about the scores to participant    Expected  Outcomes Short Term goal: Utilizing psychosocial counselor, staff and physician to assist with identification of specific Stressors or current issues interfering with healing process. Setting desired goal for each stressor or current issue identified.;Long Term Goal: Stressors or current issues are controlled or eliminated.;Short Term goal: Identification and review with participant of any Quality of Life or Depression concerns found by scoring the questionnaire.;Long Term goal: The participant improves quality of Life and PHQ9 Scores as seen by post scores and/or verbalization of changes           Quality of Life Scores:  Scores of 19 and below usually indicate a poorer quality of life in these areas.  A difference of  2-3 points is a clinically meaningful difference.  A difference of 2-3 points in the total score of the Quality of Life Index has been associated with significant improvement in overall quality of life, self-image, physical symptoms, and general health in studies assessing change in quality of life.  PHQ-9: Recent Review Flowsheet Data    Depression screen [facility] 2/9 01/04/2020 04/20/2015 12/22/2013   Decreased Interest 0 0 0   Down, Depressed, Hopeless 0 0 0   PHQ - 2 Score 0 0 0   Altered sleeping 0 - -   Tired, decreased energy 2 - -   Change in appetite 0 - -   Feeling bad or failure about yourself  0 - -   Trouble concentrating 0 - -   Moving slowly or fidgety/restless 0 - -   Suicidal thoughts 0 - -   PHQ-9 Score 2 - -   Difficult doing work/chores Not difficult at all - -     Interpretation of Total Score  Total Score Depression Severity:  1-4 = Minimal depression, 5-9 = Mild depression, 10-14 = Moderate depression, 15-19 = Moderately severe depression, 20-27 = Severe depression   Psychosocial Evaluation and Intervention:  Psychosocial Evaluation - 12/23/19 0905      Psychosocial Evaluation & Interventions   Interventions Relaxation education;Stress management  education;Encouraged to exercise with the program and follow exercise prescription    Comments He can look to his wife for support and has no concerns at this time.    Expected Outcomes Short: Exercise regularly to support mental health and notify staff of any changes. Long: maintain mental health and well being through teaching of rehab or prescribed medications independently.    Continue Psychosocial Services  Follow up required by staff           Psychosocial Re-Evaluation:  Psychosocial Discharge (Final Psychosocial Re-Evaluation):   Education: Education Goals: Education classes will be provided on a weekly basis, covering required topics. Participant will state understanding/return demonstration of topics presented.  Learning Barriers/Preferences:  Learning Barriers/Preferences - 12/23/19 0903      Learning Barriers/Preferences   Learning Barriers None    Learning Preferences None           General Pulmonary Education Topics:  Infection Prevention: - Provides verbal and written material to individual with discussion of infection control including proper hand washing and proper equipment cleaning during exercise session.   Pulmonary Rehab from 12/23/2019 in [facility] Cardiac and Pulmonary Rehab  Date 12/23/19  Educator [facility]  Instruction Review Code 1- Verbalizes Understanding      Falls Prevention: - Provides verbal and written material to individual with discussion of falls prevention and safety.   Pulmonary Rehab from 12/23/2019 in [facility] Cardiac and Pulmonary Rehab  Date 12/23/19  Educator [facility]  Instruction Review Code 1- Verbalizes Understanding      Chronic Lung Disease Review: - Group verbal instruction with posters, models, PowerPoint presentations and videos,  to review new updates, new respiratory medications, new advancements in procedures and treatments. Providing information on websites and "800" numbers for continued self-education. Includes information about  supplement oxygen, available portable oxygen systems, continuous and intermittent flow rates, oxygen safety, concentrators, and Medicare reimbursement for oxygen. Explanation of Pulmonary Drugs, including class, frequency, complications, importance of spacers, rinsing mouth after steroid MDI's, and proper cleaning methods for nebulizers. Review of basic lung anatomy and physiology related to function, structure, and complications of lung disease. Review of risk factors. Discussion about methods for diagnosing sleep apnea and types of masks and machines for OSA. Includes a review of the use of types of environmental controls: home humidity, furnaces, filters, dust mite/pet prevention, HEPA vacuums. Discussion about weather changes, air quality and the benefits of nasal washing. Instruction on Warning signs, infection symptoms, calling MD promptly, preventive modes, and value of vaccinations. Review of effective airway clearance, coughing and/or vibration techniques. Emphasizing that all should Create an Action Plan. Written material given at graduation.   AED/CPR: - Group verbal and written instruction with the use of models to demonstrate the basic use of the AED with the basic ABC's of resuscitation.    Anatomy and Cardiac Procedures: - Group verbal and visual presentation and models provide information about basic cardiac anatomy and function. Reviews the testing methods done to diagnose heart disease and the outcomes of the test results. Describes the treatment choices: Medical Management, Angioplasty, or Coronary Bypass Surgery for treating various heart conditions including Myocardial Infarction, Angina, Valve Disease, and Cardiac Arrhythmias.  Written material given at graduation.   Medication Safety: - Group verbal and visual instruction to review commonly prescribed medications for heart and lung disease. Reviews the medication, class of the drug, and side effects. Includes the steps to properly  store meds and maintain the prescription regimen.  Written material given at graduation.   Other: -Provides group and verbal instruction on various topics (see comments)   Knowledge Questionnaire Score:  Knowledge Questionnaire Score - 01/04/20 1548      Knowledge Questionnaire Score   Pre Score 14/18: Oxygen, Medications            Core Components/Risk Factors/Patient Goals at Admission:  Personal Goals and Risk Factors at Admission - 01/04/20 1547      Core Components/Risk Factors/Patient Goals on Admission    Weight Management Yes;Weight Loss    Intervention  Weight Management: Provide education and appropriate resources to help participant work on and attain dietary goals.;Weight Management: Develop a combined nutrition and exercise program designed to reach desired caloric intake, while maintaining appropriate intake of nutrient and fiber, sodium and fats, and appropriate energy expenditure required for the weight goal.;Weight Management/Obesity: Establish reasonable short term and long term weight goals.    Admit Weight 192 lb (87.1 kg)    Goal Weight: Short Term 187 lb (84.8 kg)    Goal Weight: Long Term 182 lb (82.6 kg)    Expected Outcomes Short Term: Continue to assess and modify interventions until short term weight is achieved;Long Term: Adherence to nutrition and physical activity/exercise program aimed toward attainment of established weight goal;Weight Loss: Understanding of general recommendations for a balanced deficit meal plan, which promotes 1-2 lb weight loss per week and includes a negative energy balance of [phone] kcal/d;Understanding recommendations for meals to include 15-35% energy as protein, 25-35% energy from fat, 35-60% energy from carbohydrates, less than 200mg  of dietary cholesterol, 20-35 gm of total fiber daily;Understanding of distribution of calorie intake throughout the day with the consumption of 4-5 meals/snacks    Tobacco Cessation Yes    Number of  packs per day Smokes cigars- socially    Intervention Assist the participant in steps to quit. Provide individualized education and counseling about committing to Tobacco Cessation, relapse prevention, and pharmacological support that can be provided by physician.;Advice worker, assist with locating and accessing local/national Quit Smoking programs, and support quit date choice.    Expected Outcomes Short Term: Will demonstrate readiness to quit, by selecting a quit date.;Short Term: Will quit all tobacco product use, adhering to prevention of relapse plan.;Long Term: Complete abstinence from all tobacco products for at least 12 months from quit date.    Improve shortness of breath with ADL's Yes    Intervention Provide education, individualized exercise plan and daily activity instruction to help decrease symptoms of SOB with activities of daily living.    Expected Outcomes Short Term: Improve cardiorespiratory fitness to achieve a reduction of symptoms when performing ADLs;Long Term: Be able to perform more ADLs without symptoms or delay the onset of symptoms    Hypertension Yes    Intervention Provide education on lifestyle modifcations including regular physical activity/exercise, weight management, moderate sodium restriction and increased consumption of fresh fruit, vegetables, and low fat dairy, alcohol moderation, and smoking cessation.;Monitor prescription use compliance.    Expected Outcomes Short Term: Continued assessment and intervention until BP is < 140/34mm HG in hypertensive participants. < 130/79mm HG in hypertensive participants with diabetes, heart failure or chronic kidney disease.;Long Term: Maintenance of blood pressure at goal levels.    Lipids Yes    Intervention Provide education and support for participant on nutrition & aerobic/resistive exercise along with prescribed medications to achieve LDL 70mg , HDL >40mg .    Expected Outcomes Short Term: Participant  states understanding of desired cholesterol values and is compliant with medications prescribed. Participant is following exercise prescription and nutrition guidelines.;Long Term: Cholesterol controlled with medications as prescribed, with individualized exercise RX and with personalized nutrition plan. Value goals: LDL < 70mg , HDL > 40 mg.           Education:Diabetes - Individual verbal and written instruction to review signs/symptoms of diabetes, desired ranges of glucose level fasting, after meals and with exercise. Acknowledge that pre and post exercise glucose checks will be done for 3 sessions at entry of program.   Know Your Numbers and  Heart Failure: - Group verbal and visual instruction to discuss disease risk factors for cardiac and pulmonary disease and treatment options.  Reviews associated critical values for Overweight/Obesity, Hypertension, Cholesterol, and Diabetes.  Discusses basics of heart failure: signs/symptoms and treatments.  Introduces Heart Failure Zone chart for action plan for heart failure.  Written material given at graduation.   Core Components/Risk Factors/Patient Goals Review:    Core Components/Risk Factors/Patient Goals at Discharge (Final Review):    ITP Comments:  ITP Comments    Row Name 12/23/19 0909 01/04/20 1531 01/05/20 1039       ITP Comments Virtual Visit completed. Patient informed on EP and RD appointment and 6 Minute walk test. Patient also informed of patient health questionnaires on My Chart. Patient Verbalizes understanding. Visit diagnosis [facility] in [facility]16/[facility]. Completed 6MWT and gym orientation. Initial ITP created and sent for review to Dr. Emily Filbert, Medical Director. 30 Day review completed. Medical Director ITP review done, changes made as directed, and signed approval by Medical Director. New to program            Comments:

## 2020-01-06 ENCOUNTER — Other Ambulatory Visit: Payer: Self-pay

## 2020-01-06 ENCOUNTER — Encounter: Payer: Medicare Other | Attending: Pulmonary Disease

## 2020-01-06 DIAGNOSIS — J984 Other disorders of lung: Secondary | ICD-10-CM | POA: Diagnosis present

## 2020-01-06 NOTE — Progress Notes (Signed)
Daily Session Note  Patient Details  Name: Nicholas Douglas MRN: [id number] Date of Birth: [birth year] Referring Provider:     Pulmonary Rehab from 01/04/2020 in [facility] Cardiac and Pulmonary Rehab  Referring Provider [name] MD      Encounter Date: 01/06/2020  Check In:  Session Check In - 01/06/20 0747      Check-In   Supervising physician immediately available to respond to emergencies See telemetry face sheet for immediately available ER MD    Location [facility]-Cardiac & Pulmonary Rehab    Staff Present Birdie Sons, MPA, RN;Melissa Caiola RDN, Rowe Pavy, BA, ACSM CEP, Exercise Physiologist    Virtual Visit No    Medication changes reported     No    Fall or balance concerns reported    No    Tobacco Cessation No Change    Current number of cigarettes/nicotine per day     0   pt states he quit smoking cigars 6 wks ago; does not smoke cigs   Warm-up and Cool-down Performed on first and last piece of equipment    Resistance Training Performed Yes    VAD Patient? No    PAD/SET Patient? No      Pain Assessment   Currently in Pain? No/denies              Social History   Tobacco Use  Smoking Status Current Some Day Smoker  . Packs/day: 0.25  . Years: 20.00  . Pack years: 5.00  . Types: Cigars, Pipe  Smokeless Tobacco Former Systems developer  . Types: Chew  Tobacco Comment   Smokes about 2 cigars a week. He has no intentions of quitting cigar usage.12/23/2019    Goals Met:  Proper associated with RPD/PD & O2 Sat Independence with exercise equipment Exercise tolerated well No report of cardiac concerns or symptoms Strength training completed today  Goals Unmet:  Not Applicable  Comments: First full day of exercise!  Patient was oriented to gym and equipment including functions, settings, policies, and procedures.  Patient's individual exercise prescription and treatment plan were reviewed.  All starting workloads were established based on the results of the 6  minute walk test done at initial orientation visit.  The plan for exercise progression was also introduced and progression will be customized based on patient's performance and goals.    Dr. Emily Filbert is Medical Director for [place] and [facility].

## 2020-01-09 ENCOUNTER — Other Ambulatory Visit: Payer: Self-pay | Admitting: Cardiovascular Disease

## 2020-01-11 ENCOUNTER — Other Ambulatory Visit: Payer: Self-pay

## 2020-01-11 ENCOUNTER — Encounter: Payer: Medicare Other | Admitting: *Deleted

## 2020-01-11 DIAGNOSIS — J984 Other disorders of lung: Secondary | ICD-10-CM | POA: Diagnosis not present

## 2020-01-11 NOTE — Progress Notes (Signed)
Daily Session Note  Patient Details  Name: Nicholas Douglas MRN: [id number] Date of Birth: December 29, 1931 Referring Provider:     Pulmonary Rehab from 01/04/2020 in [facility] Cardiac and Pulmonary Rehab  Referring Provider [name] MD      Encounter Date: 01/11/2020  Check In:  Session Check In - 01/11/20 0819      Check-In   Supervising physician immediately available to respond to emergencies See telemetry face sheet for immediately available ER MD    Location [facility]-Cardiac & Pulmonary Rehab    Staff Present Heath Lark, RN, BSN, Jacklynn Bue, MS Exercise Physiologist;Amanda Oletta Darter, [place], ACSM CEP, Exercise Physiologist    Virtual Visit No    Medication changes reported     No    Fall or balance concerns reported    No    Warm-up and Cool-down Performed on first and last piece of equipment    Resistance Training Performed Yes    VAD Patient? No    PAD/SET Patient? No      Pain Assessment   Currently in Pain? No/denies              Social History   Tobacco Use  Smoking Status Current Some Day Smoker  . Packs/day: 0.25  . Years: 20.00  . Pack years: 5.00  . Types: Cigars, Pipe  Smokeless Tobacco Former Systems developer  . Types: Chew  Tobacco Comment   Smokes about 2 cigars a week. He has no intentions of quitting cigar usage.12/23/2019    Goals Met:  Proper associated with RPD/PD & O2 Sat Independence with exercise equipment Exercise tolerated well No report of cardiac concerns or symptoms  Goals Unmet:  Not Applicable  Comments: Pt able to follow exercise prescription today without complaint.  Will continue to monitor for progression.    Dr. Emily Filbert is Medical Director for [place] and [facility].

## 2020-01-13 DIAGNOSIS — J984 Other disorders of lung: Secondary | ICD-10-CM | POA: Diagnosis not present

## 2020-01-13 NOTE — Progress Notes (Signed)
Daily Session Note  Patient Details  Name: Nicholas Douglas MRN: [id number] Date of Birth: 29-Jul-1931 Referring Provider:   Flowsheet Row Pulmonary Rehab from 01/04/2020 in [facility] Cardiac and Pulmonary Rehab  Referring Provider [name] MD      Encounter Date: 01/13/2020  Check In:  Session Check In - 01/13/20 0753      Check-In   Supervising physician immediately available to respond to emergencies See telemetry face sheet for immediately available ER MD    Location [facility]-Cardiac & Pulmonary Rehab    Staff Present Birdie Sons, MPA, RN;Melissa Caiola RDN, Rowe Pavy, BA, ACSM CEP, Exercise Physiologist    Virtual Visit No    Medication changes reported     No    Fall or balance concerns reported    No    Tobacco Cessation No Change    Current number of cigarettes/nicotine per day     0    Warm-up and Cool-down Performed on first and last piece of equipment    Resistance Training Performed Yes    VAD Patient? No    PAD/SET Patient? No      Pain Assessment   Currently in Pain? No/denies              Social History   Tobacco Use  Smoking Status Current Some Day Smoker  . Packs/day: 0.25  . Years: 20.00  . Pack years: 5.00  . Types: Cigars, Pipe  Smokeless Tobacco Former Systems developer  . Types: Chew  Tobacco Comment   Smokes about 2 cigars a week. He has no intentions of quitting cigar usage.12/23/2019    Goals Met:  Independence with exercise equipment Exercise tolerated well No report of cardiac concerns or symptoms Strength training completed today  Goals Unmet:  Not Applicable  Comments: Pt able to follow exercise prescription today without complaint.  Will continue to monitor for progression.    Dr. Emily Filbert is Medical Director for [place] and [facility].

## 2020-01-18 ENCOUNTER — Encounter: Payer: Medicare Other | Admitting: *Deleted

## 2020-01-18 ENCOUNTER — Other Ambulatory Visit: Payer: Self-pay

## 2020-01-18 DIAGNOSIS — J984 Other disorders of lung: Secondary | ICD-10-CM

## 2020-01-18 NOTE — Progress Notes (Signed)
Daily Session Note  Patient Details  Name: Nicholas Douglas MRN: [id number] Date of Birth: 1931-12-10 Referring Provider:   Flowsheet Row Pulmonary Rehab from 01/04/2020 in [facility] Cardiac and Pulmonary Rehab  Referring Provider [name] MD      Encounter Date: 01/18/2020  Check In:  Session Check In - 01/18/20 0841      Check-In   Supervising physician immediately available to respond to emergencies See telemetry face sheet for immediately available ER MD    Location [facility]-Cardiac & Pulmonary Rehab    Staff Present Heath Lark, RN, BSN, Jacklynn Bue, MS Exercise Physiologist;Amanda Oletta Darter, [place], ACSM CEP, Exercise Physiologist    Virtual Visit No    Medication changes reported     No    Fall or balance concerns reported    No    Warm-up and Cool-down Performed on first and last piece of equipment    Resistance Training Performed Yes    VAD Patient? No    PAD/SET Patient? No      Pain Assessment   Currently in Pain? No/denies              Social History   Tobacco Use  Smoking Status Current Some Day Smoker  . Packs/day: 0.25  . Years: 20.00  . Pack years: 5.00  . Types: Cigars, Pipe  Smokeless Tobacco Former Systems developer  . Types: Chew  Tobacco Comment   Smokes about 2 cigars a week. He has no intentions of quitting cigar usage.12/23/2019    Goals Met:  Proper associated with RPD/PD & O2 Sat Independence with exercise equipment Exercise tolerated well No report of cardiac concerns or symptoms  Goals Unmet:  Not Applicable  Comments: Pt able to follow exercise prescription today without complaint.  Will continue to monitor for progression.    Dr. Emily Filbert is Medical Director for [place] and [facility].

## 2020-01-25 ENCOUNTER — Encounter: Payer: Medicare Other | Admitting: *Deleted

## 2020-01-25 ENCOUNTER — Other Ambulatory Visit: Payer: Self-pay

## 2020-01-25 DIAGNOSIS — J984 Other disorders of lung: Secondary | ICD-10-CM | POA: Diagnosis not present

## 2020-01-25 NOTE — Progress Notes (Signed)
Daily Session Note  Patient Details  Name: Nicholas Douglas MRN: [id number] Date of Birth: [birth year] Referring Provider:   Flowsheet Row Pulmonary Rehab from 01/04/2020 in [facility] Cardiac and Pulmonary Rehab  Referring Provider [name] MD      Encounter Date: 01/25/2020  Check In:  Session Check In - 01/25/20 0757      Check-In   Supervising physician immediately available to respond to emergencies See telemetry face sheet for immediately available ER MD    Location [facility]-Cardiac & Pulmonary Rehab    Staff Present Heath Lark, RN, BSN, CCRP;Amanda Sommer, BA, ACSM CEP, Exercise Physiologist;Jessica [place], MA, RCEP, CCRP, CCET    Virtual Visit No    Medication changes reported     No    Fall or balance concerns reported    No    Warm-up and Cool-down Performed on first and last piece of equipment    Resistance Training Performed Yes    VAD Patient? No    PAD/SET Patient? No      Pain Assessment   Currently in Pain? No/denies              Social History   Tobacco Use  Smoking Status Current Some Day Smoker  . Packs/day: 0.25  . Years: 20.00  . Pack years: 5.00  . Types: Cigars, Pipe  Smokeless Tobacco Former Systems developer  . Types: Chew  Tobacco Comment   Smokes about 2 cigars a week. He has no intentions of quitting cigar usage.12/23/2019    Goals Met:  Independence with exercise equipment Exercise tolerated well No report of cardiac concerns or symptoms  Goals Unmet:  Not Applicable  Comments: Pt able to follow exercise prescription today without complaint.  Will continue to monitor for progression.    Dr. Emily Filbert is Medical Director for [place] and [facility].

## 2020-01-27 ENCOUNTER — Other Ambulatory Visit: Payer: Self-pay

## 2020-01-27 DIAGNOSIS — J984 Other disorders of lung: Secondary | ICD-10-CM | POA: Diagnosis not present

## 2020-01-27 NOTE — Progress Notes (Signed)
Daily Session Note  Patient Details  Name: Nicholas Douglas MRN: [id number] Date of Birth: Feb 17, 1931 Referring Provider:   Flowsheet Row Pulmonary Rehab from 01/04/2020 in [facility] Cardiac and Pulmonary Rehab  Referring Provider [name] MD      Encounter Date: 01/27/2020  Check In:  Session Check In - 01/27/20 0732      Check-In   Supervising physician immediately available to respond to emergencies See telemetry face sheet for immediately available ER MD    Location [facility]-Cardiac & Pulmonary Rehab    Staff Present Birdie Sons, MPA, RN;Melissa Caiola RDN, Rowe Pavy, BA, ACSM CEP, Exercise Physiologist    Virtual Visit No    Medication changes reported     No    Fall or balance concerns reported    No    Tobacco Cessation No Change    Current number of cigarettes/nicotine per day     0    Warm-up and Cool-down Performed on first and last piece of equipment    Resistance Training Performed Yes    VAD Patient? No    PAD/SET Patient? No      Pain Assessment   Currently in Pain? No/denies              Social History   Tobacco Use  Smoking Status Current Some Day Smoker  . Packs/day: 0.25  . Years: 20.00  . Pack years: 5.00  . Types: Cigars, Pipe  Smokeless Tobacco Former Systems developer  . Types: Chew  Tobacco Comment   Smokes about 2 cigars a week. He has no intentions of quitting cigar usage.12/23/2019    Goals Met:  Independence with exercise equipment Exercise tolerated well No report of cardiac concerns or symptoms Strength training completed today  Goals Unmet:  Not Applicable  Comments: Pt able to follow exercise prescription today without complaint.  Will continue to monitor for progression.    Dr. Emily Filbert is Medical Director for [place] and [facility].

## 2020-02-01 ENCOUNTER — Other Ambulatory Visit: Payer: Self-pay

## 2020-02-01 ENCOUNTER — Encounter: Payer: Medicare Other | Admitting: *Deleted

## 2020-02-01 DIAGNOSIS — J984 Other disorders of lung: Secondary | ICD-10-CM | POA: Diagnosis not present

## 2020-02-01 NOTE — Progress Notes (Signed)
Daily Session Note  Patient Details  Name: Nicholas Douglas MRN: [id number] Date of Birth: 12-Mar-1931 Referring Provider:   Flowsheet Row Pulmonary Rehab from 01/04/2020 in [facility] Cardiac and Pulmonary Rehab  Referring Provider [name] MD      Encounter Date: 02/01/2020  Check In:  Session Check In - 02/01/20 1004      Check-In   Supervising physician immediately available to respond to emergencies See telemetry face sheet for immediately available ER MD    Location [facility]-Cardiac & Pulmonary Rehab    Staff Present Nada Maclachlan, BA, ACSM CEP, Exercise Physiologist;Melissa Caiola RDN, LDN;Bless Belshe, RN, BSN, CCRP    Virtual Visit No    Medication changes reported     No    Fall or balance concerns reported    No    Warm-up and Cool-down Performed on first and last piece of equipment    Resistance Training Performed Yes    VAD Patient? No    PAD/SET Patient? No      Pain Assessment   Currently in Pain? No/denies              Social History   Tobacco Use  Smoking Status Current Some Day Smoker  . Packs/day: 0.25  . Years: 20.00  . Pack years: 5.00  . Types: Cigars, Pipe  Smokeless Tobacco Former Systems developer  . Types: Chew  Tobacco Comment   Smokes about 2 cigars a week. He has no intentions of quitting cigar usage.12/23/2019    Goals Met:  Independence with exercise equipment Exercise tolerated well No report of cardiac concerns or symptoms  Goals Unmet:  Not Applicable  Comments: Pt able to follow exercise prescription today without complaint.  Will continue to monitor for progression.    Dr. Emily Filbert is Medical Director for [place] and [facility].

## 2020-02-02 ENCOUNTER — Encounter: Payer: Self-pay | Admitting: *Deleted

## 2020-02-02 DIAGNOSIS — J984 Other disorders of lung: Secondary | ICD-10-CM

## 2020-02-02 NOTE — Progress Notes (Signed)
Pulmonary Individual Treatment Plan  Patient Details  Name: Nicholas Douglas MRN: [id number] Date of Birth: 02/12/1931 Referring Provider:   Flowsheet Row Pulmonary Rehab from 01/04/2020 in [facility] Cardiac and Pulmonary Rehab  Referring Provider [name] MD      Initial Encounter Date:  Flowsheet Row Pulmonary Rehab from 01/04/2020 in [facility] Cardiac and Pulmonary Rehab  Date 01/04/20      Visit Diagnosis: Restrictive lung disease  Patient's Home Medications on Admission:  Current Outpatient Medications:  .  acetaminophen (TYLENOL) 500 MG tablet, Take 500 mg by mouth every 6 (six) hours as needed (for pain.). , Disp: , Rfl:  .  amLODipine (NORVASC) 10 MG tablet, Take 1 tablet (10 mg total) by mouth every evening., Disp: 90 tablet, Rfl: 3 .  aspirin 81 MG tablet, Take 81 mg by mouth every evening. , Disp: , Rfl:  .  atenolol (TENORMIN) 25 MG tablet, TAKE ONE-HALF (1/2) TABLET  BY MOUTH TWICE A DAY, Disp: 90 tablet, Rfl: 3 .  Carboxymethylcell-Hypromellose (GENTEAL OP), Apply 1 drop to eye 2 (two) times daily. , Disp: , Rfl:  .  clopidogrel (PLAVIX) 75 MG tablet, Take 1 tablet (75 mg total) by mouth daily., Disp: 90 tablet, Rfl: 3 .  ezetimibe (ZETIA) 10 MG tablet, TAKE 1 TABLET BY MOUTH  DAILY, Disp: 90 tablet, Rfl: 3 .  finasteride (PROSCAR) 5 MG tablet, Take 1 tablet (5 mg total) by mouth daily. (Patient taking differently: Take 2.5 mg by mouth daily. ), Disp: 90 tablet, Rfl: 3 .  isosorbide mononitrate (IMDUR) 60 MG 24 hr tablet, TAKE 1 TABLET BY MOUTH  DAILY, Disp: 90 tablet, Rfl: 3 .  nitroGLYCERIN (NITROSTAT) 0.4 MG SL tablet, PLACE 1 TABLET (0.4 MG TOTAL) UNDER THE TONGUE EVERY 5 (FIVE) MINUTES AS NEEDED FOR CHEST PAIN., Disp: 25 tablet, Rfl: 11 .  Pitavastatin Calcium (LIVALO) 4 MG TABS, Take 1 tablet (4 mg total) by mouth every evening., Disp: 90 tablet, Rfl: 3 .  spironolactone (ALDACTONE) 25 MG tablet, Take 0.5 tablets (12.5 mg total) by mouth daily., Disp: 45 tablet, Rfl: 3 .   traZODone (DESYREL) 50 MG tablet, , Disp: , Rfl:  .  VASCEPA 1 g capsule, TAKE 1 CAPSULE BY MOUTH  TWICE DAILY, Disp: 180 capsule, Rfl: 3  Past Medical History: Past Medical History:  Diagnosis Date  . BPH (benign prostatic hyperplasia)    Followed by [name]/urology  . CAD S/P PTCA only of RPAV-PL 05/31/2015   99% --> 0%PAV - PTCA only (unable to advance STENT) due to RCA calcification - prox & distal RCA 30%; Patent LAD stent & Cx stent (~20% ISR).   . Calculus of kidney    "that's how they found the cancer" (04/27/2013)  . Coronary artery disease involving native coronary artery of native heart with unstable angina pectoris ([place]) 12/03/2011   S/P Cardiac angioplasty 1986, 1991, 1993, 1997.  Last cardiac catheterization 2001.  Cardiolite 05/2011 low risk with normal EF 65%.  Followed by [facility]ology/[name] every six months.   . History of myocardial infarction 1976  . Hypertension   . Impotence of organic origin   . Internal hemorrhoids without mention of complication   . Malignant neoplasm of kidney, except pelvis   . Melanoma ([place]) 06/2010   Left arm  . 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  . Other abnormal blood chemistry   . Other nonspecific abnormal serum enzyme levels   . Personal history of  colonic polyps   . Pure hypercholesterolemia   . Tobacco use disorder   . Unspecified congenital cystic kidney disease   . Unstable angina ([place]) 04/05/2013; 05/2015    Tobacco Use: Social History   Tobacco Use  Smoking Status Current Some Day Smoker  . Packs/day: 0.25  . Years: 20.00  . Pack years: 5.00  . Types: Cigars, Pipe  Smokeless Tobacco Former Systems developer  . Types: Chew  Tobacco Comment   Smokes about 2 cigars a week. He has no intentions of quitting cigar usage.12/23/2019    Labs: Recent Review Flowsheet Data    Labs for [facility] Cardiac and Pulmonary Rehab Latest Ref Rng & Units 11/07/2017 11/18/2017 05/25/2018 05/13/2019 11/23/2019   Cholestrol  100 - 199 mg/dL 148 124 146 132 152   LDLCALC 0 - 99 mg/dL 66 57 72 62 78   HDL >39 mg/dL 57 51 52 49 52   Trlycerides 0 - 149 mg/dL 124 82 109 116 122   Hemoglobin A1c <5.7 % - - - - -       Pulmonary Assessment Scores:  Pulmonary Assessment Scores    Row Name 01/04/20 1549         ADL UCSD   ADL Phase Entry     SOB Score total 30     Rest 0     Walk 2     Stairs 3     Bath 2     Dress 0     Shop 2           CAT Score   CAT Score 15           mMRC Score   mMRC Score 1            UCSD: Self-administered rating of dyspnea associated with activities of daily living (ADLs) 6-point scale (0 = "not at all" to 5 = "maximal or unable to do because of breathlessness")  Scoring Scores range from 0 to 120.  Minimally important difference is 5 units  CAT: CAT can identify the health impairment of COPD patients and is better correlated with disease progression.  CAT has a scoring range of zero to 40. The CAT score is classified into four groups of low (less than 10), medium (10 - 20), high (21-30) and very high (31-40) based on the impact level of disease on health status. A CAT score over 10 suggests significant symptoms.  A worsening CAT score could be explained by an exacerbation, poor medication adherence, poor inhaler technique, or progression of COPD or comorbid conditions.  CAT MCID is 2 points  mMRC: mMRC (Modified Medical Research Council) Dyspnea Scale is used to assess the degree of baseline functional disability in patients of respiratory disease due to dyspnea. No minimal important difference is established. A decrease in score of 1 point or greater is considered a positive change.   Pulmonary Function Assessment:  Pulmonary Function Assessment - 12/23/19 0901      Breath   Shortness of Breath No           Exercise Target Goals: Exercise Program Goal: Individual exercise prescription set using results from initial 6 min walk test and THRR while  considering  patient's activity barriers and safety.   Exercise Prescription Goal: Initial exercise prescription builds to 30-45 minutes a day of aerobic activity, 2-3 days per week.  Home exercise guidelines will be given to patient during program as part of exercise prescription that the participant will acknowledge.  Education: Aerobic Exercise: - Group verbal and visual presentation on the components of exercise prescription. Introduces F.I.T.T principle from ACSM for exercise prescriptions.  Reviews F.I.T.T. principles of aerobic exercise including progression. Written material given at graduation.   Education: Resistance Exercise: - Group verbal and visual presentation on the components of exercise prescription. Introduces F.I.T.T principle from ACSM for exercise prescriptions  Reviews F.I.T.T. principles of resistance exercise including progression. Written material given at graduation.    Education: Exercise & Equipment Safety: - Individual verbal instruction and demonstration of equipment use and safety with use of the equipment. Flowsheet Row Pulmonary Rehab from 01/06/2020 in [facility] Cardiac and Pulmonary Rehab  Date 12/23/19  Educator [facility]  Instruction Review Code 1- Verbalizes Understanding      Education: Exercise Physiology & General Exercise Guidelines: - Group verbal and written instruction with models to review the exercise physiology of the cardiovascular system and associated critical values. Provides general exercise guidelines with specific guidelines to those with heart or lung disease.    Education: Flexibility, Balance, Mind/Body Relaxation: - Group verbal and visual presentation with interactive activity on the components of exercise prescription. Introduces F.I.T.T principle from ACSM for exercise prescriptions. Reviews F.I.T.T. principles of flexibility and balance exercise training including progression. Also discusses the mind body connection.  Reviews various  relaxation techniques to help reduce and manage stress (i.e. Deep breathing, progressive muscle relaxation, and visualization). Balance handout provided to take home. Written material given at graduation.   Activity Barriers & Risk Stratification:  Activity Barriers & Cardiac Risk Stratification - 01/04/20 1546      Activity Barriers & Cardiac Risk Stratification   Activity Barriers Shortness of Breath;Joint Problems;Deconditioning           6 Minute Walk:  6 Minute Walk    Row Name 01/04/20 1536         6 Minute Walk   Phase Initial     Distance 1392 feet     Walk Time 6 minutes     # of Rest Breaks 0     MPH 2.63     METS 2.25     RPE 11     Perceived Dyspnea  1     VO2 Peak 7.79     Symptoms No     Resting HR 62 bpm     Resting BP 116/64     Resting Oxygen Saturation  95 %     Exercise Oxygen Saturation  during 6 min walk 85 %     Max Ex. HR 98 bpm     Max Ex. BP 156/70     2 Minute Post BP 124/64           Interval HR   1 Minute HR 78     2 Minute HR 91     3 Minute HR 94     4 Minute HR 95     5 Minute HR 95     6 Minute HR 98     2 Minute Post HR 71     Interval Heart Rate? Yes           Interval Oxygen   Interval Oxygen? Yes     Baseline Oxygen Saturation % 95 %     1 Minute Oxygen Saturation % 91 %     1 Minute Liters of Oxygen 0 L  RA     2 Minute Oxygen Saturation % 88 %     2 Minute Liters of Oxygen  0 L     3 Minute Oxygen Saturation % 86 %     3 Minute Liters of Oxygen 0 L     4 Minute Oxygen Saturation % 85 %     4 Minute Liters of Oxygen 0 L     5 Minute Oxygen Saturation % 86 %     5 Minute Liters of Oxygen 0 L     6 Minute Oxygen Saturation % 86 %     6 Minute Liters of Oxygen 0 L     2 Minute Post Oxygen Saturation % 94 %     2 Minute Post Liters of Oxygen 0 L           Oxygen Initial Assessment:  Oxygen Initial Assessment - 01/04/20 1549      Home Oxygen   Home Oxygen Device None    Sleep Oxygen Prescription None    Home  Exercise Oxygen Prescription None    Home Resting Oxygen Prescription None    Compliance with Home Oxygen Use Yes      Initial 6 min Walk   Oxygen Used None      Program Oxygen Prescription   Program Oxygen Prescription None      Intervention   Short Term Goals To learn and understand importance of monitoring SPO2 with pulse oximeter and demonstrate accurate use of the pulse oximeter.;To learn and understand importance of maintaining oxygen saturations>88%;To learn and demonstrate proper pursed lip breathing techniques or other breathing techniques.;To learn and demonstrate proper use of respiratory medications    Long  Term Goals Maintenance of O2 saturations>88%;Verbalizes importance of monitoring SPO2 with pulse oximeter and return demonstration;Exhibits proper breathing techniques, such as pursed lip breathing or other method taught during program session;Demonstrates proper use of MDI's;Compliance with respiratory medication           Oxygen Re-Evaluation:  Oxygen Re-Evaluation    Row Name 01/06/20 0749             Program Oxygen Prescription   Program Oxygen Prescription None               Home Oxygen   Home Oxygen Device None       Sleep Oxygen Prescription None       Home Exercise Oxygen Prescription None       Home Resting Oxygen Prescription None       Compliance with Home Oxygen Use Yes               Goals/Expected Outcomes   Short Term Goals To learn and understand importance of monitoring SPO2 with pulse oximeter and demonstrate accurate use of the pulse oximeter.;To learn and understand importance of maintaining oxygen saturations>88%;To learn and demonstrate proper pursed lip breathing techniques or other breathing techniques.       Long  Term Goals Maintenance of O2 saturations>88%;Verbalizes importance of monitoring SPO2 with pulse oximeter and return demonstration;Exhibits proper breathing techniques, such as pursed lip breathing or other method taught  during program session       Comments Reviewed PLB technique with pt.  Talked about how it works and it's importance in maintaining their exercise saturations.       Goals/Expected Outcomes Short: Become more profiecient at using PLB.   Long: Become independent at using PLB.              Oxygen Discharge (Final Oxygen Re-Evaluation):  Oxygen Re-Evaluation - 01/06/20 0749      Program Oxygen Prescription  Program Oxygen Prescription None      Home Oxygen   Home Oxygen Device None    Sleep Oxygen Prescription None    Home Exercise Oxygen Prescription None    Home Resting Oxygen Prescription None    Compliance with Home Oxygen Use Yes      Goals/Expected Outcomes   Short Term Goals To learn and understand importance of monitoring SPO2 with pulse oximeter and demonstrate accurate use of the pulse oximeter.;To learn and understand importance of maintaining oxygen saturations>88%;To learn and demonstrate proper pursed lip breathing techniques or other breathing techniques.    Long  Term Goals Maintenance of O2 saturations>88%;Verbalizes importance of monitoring SPO2 with pulse oximeter and return demonstration;Exhibits proper breathing techniques, such as pursed lip breathing or other method taught during program session    Comments Reviewed PLB technique with pt.  Talked about how it works and it's importance in maintaining their exercise saturations.    Goals/Expected Outcomes Short: Become more profiecient at using PLB.   Long: Become independent at using PLB.           Initial Exercise Prescription:  Initial Exercise Prescription - 01/04/20 1500      Date of Initial Exercise RX and Referring Provider   Date 01/04/20    Referring Provider [name] MD      Treadmill   MPH 2.2    Grade 0    Minutes 15    METs 2.68      NuStep   Level 2    SPM 80    Minutes 15    METs 2.2      REL-XR   Level 2    Speed 50    Minutes 15    METs 2.2      Prescription Details    Frequency (times per week) 2    Duration Progress to 30 minutes of continuous aerobic without signs/symptoms of physical distress      Intensity   THRR 40-80% of Max Heartrate 90-119    Ratings of Perceived Exertion 11-13    Perceived Dyspnea 0-4      Progression   Progression Continue to progress workloads to maintain intensity without signs/symptoms of physical distress.      Resistance Training   Training Prescription Yes    Weight 3 lb    Reps 10-15           Perform Capillary Blood Glucose checks as needed.  Exercise Prescription Changes:  Exercise Prescription Changes    Row Name 01/04/20 1500 01/19/20 0900 02/01/20 1400         Response to Exercise   Blood Pressure (Admit) 116/64 124/66 104/58     Blood Pressure (Exercise) 156/70 116/66 148/70     Blood Pressure (Exit) 124/64 106/60 102/64     Heart Rate (Admit) 62 bpm 65 bpm 62 bpm     Heart Rate (Exercise) 98 bpm 99 bpm 99 bpm     Heart Rate (Exit) 62 bpm 74 bpm 74 bpm     Oxygen Saturation (Admit) 95 % 95 % 95 %     Oxygen Saturation (Exercise) 85 % 89 % 88 %     Oxygen Saturation (Exit) 95 % 95 % 89 %     Rating of Perceived Exertion (Exercise) 11 14 11      Perceived Dyspnea (Exercise) 1 1 0     Symptoms none none none     Comments walk test results -- --     Duration --  Progress to 30 minutes of  aerobic without signs/symptoms of physical distress Continue with 30 min of aerobic exercise without signs/symptoms of physical distress.     Intensity -- THRR unchanged THRR unchanged           Progression   Progression -- Continue to progress workloads to maintain intensity without signs/symptoms of physical distress. Continue to progress workloads to maintain intensity without signs/symptoms of physical distress.     Average METs -- 3.82 3.14           Resistance Training   Training Prescription -- Yes Yes     Weight -- 3 lb 5 lb     Reps -- 10-15 10-15           Interval Training   Interval  Training -- No No           Treadmill   MPH -- 2.2 2.5     Grade -- 0 1     Minutes -- 15 15     METs -- 2.68 3.26           NuStep   Level -- 2 3     SPM -- -- 80     Minutes -- 15 15     METs -- 3.6 3           REL-XR   Level -- 4 --     Minutes -- 15 --     METs -- 5.2 --            Exercise Comments:  Exercise Comments    Row Name 01/06/20 [phone]           Exercise Comments First full day of exercise!  Patient was oriented to gym and equipment including functions, settings, policies, and procedures.  Patient's individual exercise prescription and treatment plan were reviewed.  All starting workloads were established based on the results of the 6 minute walk test done at initial orientation visit.  The plan for exercise progression was also introduced and progression will be customized based on patient's performance and goals.              Exercise Goals and Review:  Exercise Goals    Row Name 01/04/20 1545             Exercise Goals   Increase Physical Activity Yes       Intervention Provide advice, education, support and counseling about physical activity/exercise needs.;Develop an individualized exercise prescription for aerobic and resistive training based on initial evaluation findings, risk stratification, comorbidities and participant's personal goals.       Expected Outcomes Short Term: Attend rehab on a regular basis to increase amount of physical activity.;Long Term: Add in home exercise to make exercise part of routine and to increase amount of physical activity.;Long Term: Exercising regularly at least 3-5 days a week.       Increase Strength and Stamina Yes       Intervention Provide advice, education, support and counseling about physical activity/exercise needs.;Develop an individualized exercise prescription for aerobic and resistive training based on initial evaluation findings, risk stratification, comorbidities and participant's personal goals.        Expected Outcomes Short Term: Increase workloads from initial exercise prescription for resistance, speed, and METs.;Short Term: Perform resistance training exercises routinely during rehab and add in resistance training at home;Long Term: Improve cardiorespiratory fitness, muscular endurance and strength as measured by increased METs and functional capacity (6MWT)  Able to understand and use rate of perceived exertion (RPE) scale Yes       Intervention Provide education and explanation on how to use RPE scale       Expected Outcomes Short Term: Able to use RPE daily in rehab to express subjective intensity level;Long Term:  Able to use RPE to guide intensity level when exercising independently       Able to understand and use Dyspnea scale Yes       Intervention Provide education and explanation on how to use Dyspnea scale       Expected Outcomes Short Term: Able to use Dyspnea scale daily in rehab to express subjective sense of shortness of breath during exertion;Long Term: Able to use Dyspnea scale to guide intensity level when exercising independently       Knowledge and understanding of Target Heart Rate Range (THRR) Yes       Intervention Provide education and explanation of THRR including how the numbers were predicted and where they are located for reference       Expected Outcomes Short Term: Able to state/look up THRR;Short Term: Able to use daily as guideline for intensity in rehab;Long Term: Able to use THRR to govern intensity when exercising independently       Able to check pulse independently Yes       Intervention Provide education and demonstration on how to check pulse in carotid and radial arteries.;Review the importance of being able to check your own pulse for safety during independent exercise       Expected Outcomes Short Term: Able to explain why pulse checking is important during independent exercise;Long Term: Able to check pulse independently and accurately        Understanding of Exercise Prescription Yes       Intervention Provide education, explanation, and written materials on patient's individual exercise prescription       Expected Outcomes Short Term: Able to explain program exercise prescription;Long Term: Able to explain home exercise prescription to exercise independently              Exercise Goals Re-Evaluation :  Exercise Goals Re-Evaluation    Row Name 01/06/20 0749 01/19/20 0951 01/25/20 0925 02/01/20 1429       Exercise Goal Re-Evaluation   Exercise Goals Review Increase Physical Activity;Able to understand and use rate of perceived exertion (RPE) scale;Knowledge and understanding of Target Heart Rate Range (THRR);Understanding of Exercise Prescription;Increase Strength and Stamina;Able to understand and use Dyspnea scale;Able to check pulse independently Increase Strength and Stamina;Increase Physical Activity;Understanding of Exercise Prescription Increase Strength and Stamina;Increase Physical Activity;Understanding of Exercise Prescription Increase Physical Activity;Increase Strength and Stamina    Comments Reviewed RPE and dyspnea scales, THR and program prescription with pt today.  Pt voiced understanding and was given a copy of goals to take home. Rosanne Ashing is off to a good start in rehab.  He has already gotten to 5.2 METs on the XR.  We will continue to monitor his prigress. Reviewed home exercise with pt today.  Pt plans to walk for exercise.  Patient golfs 3x/week but does not really do continuous aerobic exercise. Reviewed THR, pulse, RPE, sign and symptoms, pulse oximetery and when to call 911 or MD.  Also discussed weather considerations and indoor options.  Pt voiced understanding. Rosanne Ashing had increased TM to 2.7 and 4.5 % grade.  His oxygen dropped to 86% so staff had him rest and coninue at 2.5 and 1 % which kept oxygen 88%  and above.  Staff reviewed importance of maintaining O2 above 88%.    Expected Outcomes Short: Use RPE daily to  regulate intensity. Long: Follow program prescription in THR. Short: Continue to attend rehab regularly Long: Continue to follow program prescription Short: Add 1-2 days of exercise at home Long: Exercise independently at home with no complications Short: keep oxygen above 88% at all times when exercising Long: improve stamina overall           Discharge Exercise Prescription (Final Exercise Prescription Changes):  Exercise Prescription Changes - 02/01/20 1400      Response to Exercise   Blood Pressure (Admit) 104/58    Blood Pressure (Exercise) 148/70    Blood Pressure (Exit) 102/64    Heart Rate (Admit) 62 bpm    Heart Rate (Exercise) 99 bpm    Heart Rate (Exit) 74 bpm    Oxygen Saturation (Admit) 95 %    Oxygen Saturation (Exercise) 88 %    Oxygen Saturation (Exit) 89 %    Rating of Perceived Exertion (Exercise) 11    Perceived Dyspnea (Exercise) 0    Symptoms none    Duration Continue with 30 min of aerobic exercise without signs/symptoms of physical distress.    Intensity THRR unchanged      Progression   Progression Continue to progress workloads to maintain intensity without signs/symptoms of physical distress.    Average METs 3.14      Resistance Training   Training Prescription Yes    Weight 5 lb    Reps 10-15      Interval Training   Interval Training No      Treadmill   MPH 2.5    Grade 1    Minutes 15    METs 3.26      NuStep   Level 3    SPM 80    Minutes 15    METs 3           Nutrition:  Target Goals: Understanding of nutrition guidelines, daily intake of sodium 1500mg , cholesterol 200mg , calories 30% from fat and 7% or less from saturated fats, daily to have 5 or more servings of fruits and vegetables.  Education: All About Nutrition: -Group instruction provided by verbal, written material, interactive activities, discussions, models, and posters to present general guidelines for heart healthy nutrition including fat, fiber, MyPlate, the role  of sodium in heart healthy nutrition, utilization of the nutrition label, and utilization of this knowledge for meal planning. Follow up email sent as well. Written material given at graduation.   Biometrics:  Pre Biometrics - 01/04/20 1546      Pre Biometrics   Height 5' 7.5" (1.715 m)    Weight 192 lb 11.2 oz (87.4 kg)    BMI (Calculated) 29.72    Single Leg Stand 4.94 seconds            Nutrition Therapy Plan and Nutrition Goals:  Nutrition Therapy & Goals - 01/10/20 1444      Personal Nutrition Goals   Comments Picture Your Plate: 46:  A PYP score of 41-50 indicates a dietary pattern that is also unlikely to meet current recommendations for good health and has room for improvement. Gertrude has 0-1 vegetables and fruit per day - fruit usually with syrup; 0 dark green vegetables, 0-3 red and orange vegetables, 15+ starchy vegetables per week. He eats refined white breads and no other grains. He eats 2+ servings of bacon/sausage per week and 1 serving of hot dogs  or deli meat/week. He eats red meat 3-4x/week sometimes with fat not trimmed, ground meat 11-19% fat. He has chicken that has skin, deep friedn, wings, nuggets, and/or smoked. He will have fish 1x/week. He will have 3+ servings of beans per week and 0-1 servings of nuts/nut butter per week. He will have 0 or 4+ servings of low fat dairy per day and he eats both natural and processed cheese. He will have gravy or meat drippings 1x/week, he uses butter, and unsaturated fat to fry, bake and use on vegetables. He doesn't get low sodium or no salt added canned items, he will have seasoning packets and sauce 1x/week, he does not cook with salt. He has snacks like chips, crackers, pretzles, popcorn 2-3x/week. He goes out to eat 2-3x/week and eats fried foods 3+x/week. He has 1 regular soda and 2+ sugar sweetened coffee or tea per day. He does not drink alcohol.           Nutrition Assessments:  MEDIFICTS Score Key:  ?70 Need to make  dietary changes   40-70 Heart Healthy Diet  ? 40 Therapeutic Level Cholesterol Diet  Flowsheet Row Pulmonary Rehab from 01/04/2020 in [facility] Cardiac and Pulmonary Rehab  Picture Your Plate Total Score on Admission 46     Picture Your Plate Scores:  [id number] Unhealthy dietary pattern with much room for improvement.  41-50 Dietary pattern unlikely to meet recommendations for good health and room for improvement.  51-60 More healthful dietary pattern, with some room for improvement.   >60 Healthy dietary pattern, although there may be some specific behaviors that could be improved.   Nutrition Goals Re-Evaluation:  Nutrition Goals Re-Evaluation    [place] Name 01/25/20 [phone]             Goals   Nutrition Goal Patient has not completed their initial consultation with the RD at this time. Once scheduled, RD will establish nutrition goals with patient       Comment Patient has not had goals set up with RD at this time. Patient is really focusing on portion control and cutting out white bread.       Expected Outcome Short: Meet with RD Long: Maintain overall healthy diet              Nutrition Goals Discharge (Final Nutrition Goals Re-Evaluation):  Nutrition Goals Re-Evaluation - 01/25/20 0931      Goals   Nutrition Goal Patient has not completed their initial consultation with the RD at this time. Once scheduled, RD will establish nutrition goals with patient    Comment Patient has not had goals set up with RD at this time. Patient is really focusing on portion control and cutting out white bread.    Expected Outcome Short: Meet with RD Long: Maintain overall healthy diet           Psychosocial: Target Goals: Acknowledge presence or absence of significant depression and/or stress, maximize coping skills, provide positive support system. Participant is able to verbalize types and ability to use techniques and skills needed for reducing stress and depression.   Education: Stress,  Anxiety, and Depression - Group verbal and visual presentation to define topics covered.  Reviews how body is impacted by stress, anxiety, and depression.  Also discusses healthy ways to reduce stress and to treat/manage anxiety and depression.  Written material given at graduation.   Education: Sleep Hygiene -Provides group verbal and written instruction about how sleep can affect your health.  Define sleep hygiene,  discuss sleep cycles and impact of sleep habits. Review good sleep hygiene tips.    Initial Review & Psychosocial Screening:  Initial Psych Review & Screening - 12/23/19 0904      Initial Review   Current issues with None Identified      Family Dynamics   Good Support System? Yes    Comments He can look to his wife for support and has no concerns at this time.      Barriers   Psychosocial barriers to participate in program There are no identifiable barriers or psychosocial needs.;The patient should benefit from training in stress management and relaxation.      Screening Interventions   Interventions Encouraged to exercise;To provide support and resources with identified psychosocial needs;Provide feedback about the scores to participant    Expected Outcomes Short Term goal: Utilizing psychosocial counselor, staff and physician to assist with identification of specific Stressors or current issues interfering with healing process. Setting desired goal for each stressor or current issue identified.;Long Term Goal: Stressors or current issues are controlled or eliminated.;Short Term goal: Identification and review with participant of any Quality of Life or Depression concerns found by scoring the questionnaire.;Long Term goal: The participant improves quality of Life and PHQ9 Scores as seen by post scores and/or verbalization of changes           Quality of Life Scores:  Scores of 19 and below usually indicate a poorer quality of life in these areas.  A difference of  2-3  points is a clinically meaningful difference.  A difference of 2-3 points in the total score of the Quality of Life Index has been associated with significant improvement in overall quality of life, self-image, physical symptoms, and general health in studies assessing change in quality of life.  PHQ-9: Recent Review Flowsheet Data    Depression screen [facility] 2/9 01/04/2020 04/20/2015 12/22/2013   Decreased Interest 0 0 0   Down, Depressed, Hopeless 0 0 0   PHQ - 2 Score 0 0 0   Altered sleeping 0 - -   Tired, decreased energy 2 - -   Change in appetite 0 - -   Feeling bad or failure about yourself  0 - -   Trouble concentrating 0 - -   Moving slowly or fidgety/restless 0 - -   Suicidal thoughts 0 - -   PHQ-9 Score 2 - -   Difficult doing work/chores Not difficult at all - -     Interpretation of Total Score  Total Score Depression Severity:  1-4 = Minimal depression, 5-9 = Mild depression, 10-14 = Moderate depression, 15-19 = Moderately severe depression, 20-27 = Severe depression   Psychosocial Evaluation and Intervention:  Psychosocial Evaluation - 12/23/19 0905      Psychosocial Evaluation & Interventions   Interventions Relaxation education;Stress management education;Encouraged to exercise with the program and follow exercise prescription    Comments He can look to his wife for support and has no concerns at this time.    Expected Outcomes Short: Exercise regularly to support mental health and notify staff of any changes. Long: maintain mental health and well being through teaching of rehab or prescribed medications independently.    Continue Psychosocial Services  Follow up required by staff           Psychosocial Re-Evaluation:  Psychosocial Re-Evaluation    [place] Name 01/25/20 [phone]             Psychosocial Re-Evaluation   Current issues with  None Identified       Comments Clair Gulling is doing well mentally. Patient denies any concerns at this time regarding sleep, stress,  depression. He states his number of hours of sleep is sometimes limited, however, does not effect hm during the day. He has great support from his wife who looks out for him. He stays social by going golfing 3x/week.       Expected Outcomes Short: Continue attending Pulmonary Rehab Long: Maintain positive attitude with continuous exercise       Interventions Encouraged to attend Pulmonary Rehabilitation for the exercise       Continue Psychosocial Services  Follow up required by staff              Psychosocial Discharge (Final Psychosocial Re-Evaluation):  Psychosocial Re-Evaluation - 01/25/20 0934      Psychosocial Re-Evaluation   Current issues with None Identified    Comments Clair Gulling is doing well mentally. Patient denies any concerns at this time regarding sleep, stress, depression. He states his number of hours of sleep is sometimes limited, however, does not effect hm during the day. He has great support from his wife who looks out for him. He stays social by going golfing 3x/week.    Expected Outcomes Short: Continue attending Pulmonary Rehab Long: Maintain positive attitude with continuous exercise    Interventions Encouraged to attend Pulmonary Rehabilitation for the exercise    Continue Psychosocial Services  Follow up required by staff           Education: Education Goals: Education classes will be provided on a weekly basis, covering required topics. Participant will state understanding/return demonstration of topics presented.  Learning Barriers/Preferences:  Learning Barriers/Preferences - 12/23/19 0903      Learning Barriers/Preferences   Learning Barriers None    Learning Preferences None           General Pulmonary Education Topics:  Infection Prevention: - Provides verbal and written material to individual with discussion of infection control including proper hand washing and proper equipment cleaning during exercise session. Flowsheet Row Pulmonary Rehab from  01/06/2020 in [facility] Cardiac and Pulmonary Rehab  Date 12/23/19  Educator [facility]  Instruction Review Code 1- Verbalizes Understanding      Falls Prevention: - Provides verbal and written material to individual with discussion of falls prevention and safety. Flowsheet Row Pulmonary Rehab from 01/06/2020 in [facility] Cardiac and Pulmonary Rehab  Date 12/23/19  Educator [facility]  Instruction Review Code 1- Verbalizes Understanding      Chronic Lung Disease Review: - Group verbal instruction with posters, models, PowerPoint presentations and videos,  to review new updates, new respiratory medications, new advancements in procedures and treatments. Providing information on websites and "800" numbers for continued self-education. Includes information about supplement oxygen, available portable oxygen systems, continuous and intermittent flow rates, oxygen safety, concentrators, and Medicare reimbursement for oxygen. Explanation of Pulmonary Drugs, including class, frequency, complications, importance of spacers, rinsing mouth after steroid MDI's, and proper cleaning methods for nebulizers. Review of basic lung anatomy and physiology related to function, structure, and complications of lung disease. Review of risk factors. Discussion about methods for diagnosing sleep apnea and types of masks and machines for OSA. Includes a review of the use of types of environmental controls: home humidity, furnaces, filters, dust mite/pet prevention, HEPA vacuums. Discussion about weather changes, air quality and the benefits of nasal washing. Instruction on Warning signs, infection symptoms, calling MD promptly, preventive modes, and value of vaccinations. Review of effective airway clearance,  coughing and/or vibration techniques. Emphasizing that all should Create an Action Plan. Written material given at graduation. Flowsheet Row Pulmonary Rehab from 01/06/2020 in [facility] Cardiac and Pulmonary Rehab  Date 01/06/20  Educator [facility]   Instruction Review Code 1- Verbalizes Understanding      AED/CPR: - Group verbal and written instruction with the use of models to demonstrate the basic use of the AED with the basic ABC's of resuscitation.    Anatomy and Cardiac Procedures: - Group verbal and visual presentation and models provide information about basic cardiac anatomy and function. Reviews the testing methods done to diagnose heart disease and the outcomes of the test results. Describes the treatment choices: Medical Management, Angioplasty, or Coronary Bypass Surgery for treating various heart conditions including Myocardial Infarction, Angina, Valve Disease, and Cardiac Arrhythmias.  Written material given at graduation.   Medication Safety: - Group verbal and visual instruction to review commonly prescribed medications for heart and lung disease. Reviews the medication, class of the drug, and side effects. Includes the steps to properly store meds and maintain the prescription regimen.  Written material given at graduation.   Other: -Provides group and verbal instruction on various topics (see comments)   Knowledge Questionnaire Score:  Knowledge Questionnaire Score - 01/04/20 1548      Knowledge Questionnaire Score   Pre Score 14/18: Oxygen, Medications            Core Components/Risk Factors/Patient Goals at Admission:  Personal Goals and Risk Factors at Admission - 01/04/20 1547      Core Components/Risk Factors/Patient Goals on Admission    Weight Management Yes;Weight Loss    Intervention Weight Management: Provide education and appropriate resources to help participant work on and attain dietary goals.;Weight Management: Develop a combined nutrition and exercise program designed to reach desired caloric intake, while maintaining appropriate intake of nutrient and fiber, sodium and fats, and appropriate energy expenditure required for the weight goal.;Weight Management/Obesity: Establish reasonable  short term and long term weight goals.    Admit Weight 192 lb (87.1 kg)    Goal Weight: Short Term 187 lb (84.8 kg)    Goal Weight: Long Term 182 lb (82.6 kg)    Expected Outcomes Short Term: Continue to assess and modify interventions until short term weight is achieved;Long Term: Adherence to nutrition and physical activity/exercise program aimed toward attainment of established weight goal;Weight Loss: Understanding of general recommendations for a balanced deficit meal plan, which promotes 1-2 lb weight loss per week and includes a negative energy balance of [phone] kcal/d;Understanding recommendations for meals to include 15-35% energy as protein, 25-35% energy from fat, 35-60% energy from carbohydrates, less than 200mg  of dietary cholesterol, 20-35 gm of total fiber daily;Understanding of distribution of calorie intake throughout the day with the consumption of 4-5 meals/snacks    Tobacco Cessation Yes    Number of packs per day Smokes cigars- socially    Intervention Assist the participant in steps to quit. Provide individualized education and counseling about committing to Tobacco Cessation, relapse prevention, and pharmacological support that can be provided by physician.;Advice worker, assist with locating and accessing local/national Quit Smoking programs, and support quit date choice.    Expected Outcomes Short Term: Will demonstrate readiness to quit, by selecting a quit date.;Short Term: Will quit all tobacco product use, adhering to prevention of relapse plan.;Long Term: Complete abstinence from all tobacco products for at least 12 months from quit date.    Improve shortness of breath with ADL's Yes  Intervention Provide education, individualized exercise plan and daily activity instruction to help decrease symptoms of SOB with activities of daily living.    Expected Outcomes Short Term: Improve cardiorespiratory fitness to achieve a reduction of symptoms when performing  ADLs;Long Term: Be able to perform more ADLs without symptoms or delay the onset of symptoms    Hypertension Yes    Intervention Provide education on lifestyle modifcations including regular physical activity/exercise, weight management, moderate sodium restriction and increased consumption of fresh fruit, vegetables, and low fat dairy, alcohol moderation, and smoking cessation.;Monitor prescription use compliance.    Expected Outcomes Short Term: Continued assessment and intervention until BP is < 140/9mm HG in hypertensive participants. < 130/13mm HG in hypertensive participants with diabetes, heart failure or chronic kidney disease.;Long Term: Maintenance of blood pressure at goal levels.    Lipids Yes    Intervention Provide education and support for participant on nutrition & aerobic/resistive exercise along with prescribed medications to achieve LDL 70mg , HDL >40mg .    Expected Outcomes Short Term: Participant states understanding of desired cholesterol values and is compliant with medications prescribed. Participant is following exercise prescription and nutrition guidelines.;Long Term: Cholesterol controlled with medications as prescribed, with individualized exercise RX and with personalized nutrition plan. Value goals: LDL < 70mg , HDL > 40 mg.           Education:Diabetes - Individual verbal and written instruction to review signs/symptoms of diabetes, desired ranges of glucose level fasting, after meals and with exercise. Acknowledge that pre and post exercise glucose checks will be done for 3 sessions at entry of program.   Know Your Numbers and Heart Failure: - Group verbal and visual instruction to discuss disease risk factors for cardiac and pulmonary disease and treatment options.  Reviews associated critical values for Overweight/Obesity, Hypertension, Cholesterol, and Diabetes.  Discusses basics of heart failure: signs/symptoms and treatments.  Introduces Heart Failure Zone chart  for action plan for heart failure.  Written material given at graduation.   Core Components/Risk Factors/Patient Goals Review:   Goals and Risk Factor Review    Row Name 01/25/20 0739             Core Components/Risk Factors/Patient Goals Review   Personal Goals Review Weight Management/Obesity;Tobacco Cessation;Improve shortness of breath with ADL's;Lipids;Hypertension       Review Clair Gulling is doing well. His SOB has immproved slighly. He really tries to practice using PLB when needed.  Does not have desire to quit cigars at this time as he smokes them socially while he golfs. BP is stable at home and at rehab- checks it couples times/ week, typically runs around 120-130s/70s. Right now, he thinks he gained 1-2 lbs due to extra food with the holidays. He is currently 192 lb and his goal is to get down to 180 lb. He is focusing on small servings and maintain appropriate portion sizes. Wife focuses on healthy diet, cutting out white bread to help.              Core Components/Risk Factors/Patient Goals at Discharge (Final Review):   Goals and Risk Factor Review - 01/25/20 0739      Core Components/Risk Factors/Patient Goals Review   Personal Goals Review Weight Management/Obesity;Tobacco Cessation;Improve shortness of breath with ADL's;Lipids;Hypertension    Review Clair Gulling is doing well. His SOB has immproved slighly. He really tries to practice using PLB when needed.  Does not have desire to quit cigars at this time as he smokes them socially while he golfs. BP  is stable at home and at rehab- checks it couples times/ week, typically runs around 120-130s/70s. Right now, he thinks he gained 1-2 lbs due to extra food with the holidays. He is currently 192 lb and his goal is to get down to 180 lb. He is focusing on small servings and maintain appropriate portion sizes. Wife focuses on healthy diet, cutting out white bread to help.           ITP Comments:  ITP Comments    Row Name 12/23/19 0909  01/04/20 1531 01/05/20 1039 01/06/20 0748 02/02/20 0624   ITP Comments Virtual Visit completed. Patient informed on EP and RD appointment and 6 Minute walk test. Patient also informed of patient health questionnaires on My Chart. Patient Verbalizes understanding. Visit diagnosis can be found in [facility] 11/16/2019. Completed 6MWT and gym orientation. Initial ITP created and sent for review to Dr. Emily Filbert, Medical Director. 30 Day review completed. Medical Director ITP review done, changes made as directed, and signed approval by Medical Director. New to program First full day of exercise!  Patient was oriented to gym and equipment including functions, settings, policies, and procedures.  Patient's individual exercise prescription and treatment plan were reviewed.  All starting workloads were established based on the results of the 6 minute walk test done at initial orientation visit.  The plan for exercise progression was also introduced and progression will be customized based on patient's performance and goals. 30 Day review completed. Medical Director ITP review done, changes made as directed, and signed approval by Medical Director.          Comments:

## 2020-02-03 ENCOUNTER — Encounter: Payer: Medicare Other | Admitting: *Deleted

## 2020-02-03 ENCOUNTER — Other Ambulatory Visit: Payer: Self-pay

## 2020-02-03 DIAGNOSIS — J984 Other disorders of lung: Secondary | ICD-10-CM

## 2020-02-03 NOTE — Progress Notes (Signed)
Daily Session Note  Patient Details  Name: KRZYSZTOF REICHELT MRN: [id number] Date of Birth: [birth year] Referring Provider:   Flowsheet Row Pulmonary Rehab from 01/04/2020 in [facility] Cardiac and Pulmonary Rehab  Referring Provider [name] MD      Encounter Date: 02/03/2020  Check In:  Session Check In - 02/03/20 0837      Check-In   Supervising physician immediately available to respond to emergencies See telemetry face sheet for immediately available ER MD    Location [facility]-Cardiac & Pulmonary Rehab    Staff Present Heath Lark, RN, BSN, CCRP;Melissa Caiola RDN, Tawanna Solo, MS Exercise Physiologist    Virtual Visit No    Medication changes reported     No    Fall or balance concerns reported    No    Warm-up and Cool-down Performed on first and last piece of equipment    Resistance Training Performed Yes    VAD Patient? No    PAD/SET Patient? No      Pain Assessment   Currently in Pain? No/denies              Social History   Tobacco Use  Smoking Status Current Some Day Smoker  . Packs/day: 0.25  . Years: 20.00  . Pack years: 5.00  . Types: Cigars, Pipe  Smokeless Tobacco Former Systems developer  . Types: Chew  Tobacco Comment   Smokes about 2 cigars a week. He has no intentions of quitting cigar usage.12/23/2019    Goals Met:  Proper associated with RPD/PD & O2 Sat Independence with exercise equipment Exercise tolerated well No report of cardiac concerns or symptoms  Goals Unmet:  Not Applicable  Comments: Pt able to follow exercise prescription today without complaint.  Will continue to monitor for progression.    Dr. Emily Filbert is Medical Director for [facility].

## 2020-02-07 ENCOUNTER — Encounter: Payer: Medicare Other | Attending: Pulmonary Disease

## 2020-02-07 ENCOUNTER — Other Ambulatory Visit: Payer: Self-pay

## 2020-02-07 DIAGNOSIS — J984 Other disorders of lung: Secondary | ICD-10-CM | POA: Insufficient documentation

## 2020-02-07 NOTE — Progress Notes (Signed)
Completed initial RD Evaluation.  

## 2020-02-08 ENCOUNTER — Encounter: Payer: Medicare Other | Admitting: *Deleted

## 2020-02-08 ENCOUNTER — Other Ambulatory Visit: Payer: Self-pay

## 2020-02-08 DIAGNOSIS — J984 Other disorders of lung: Secondary | ICD-10-CM

## 2020-02-08 NOTE — Progress Notes (Signed)
Daily Session Note  Patient Details  Name: Nicholas Douglas MRN: [id number] Date of Birth: 07/17/1931 Referring Provider:   April Manson Pulmonary Rehab from 01/04/2020 in [facility] Cardiac and Pulmonary Rehab  Referring Provider [name] MD      Encounter Date: 02/08/2020  Check In:  Session Check In - 02/08/20 0806      Check-In   Supervising physician immediately available to respond to emergencies See telemetry face sheet for immediately available ER MD    Location [facility]-Cardiac & Pulmonary Rehab    Staff Present Nada Maclachlan, BA, ACSM CEP, Exercise Physiologist;Kara Eliezer Bottom, MS Exercise Physiologist;Chanc Kervin, RN, BSN, CCRP    Virtual Visit No    Medication changes reported     No    Fall or balance concerns reported    No    Warm-up and Cool-down Performed on first and last piece of equipment    Resistance Training Performed Yes    VAD Patient? No    PAD/SET Patient? No      Pain Assessment   Currently in Pain? No/denies              Social History   Tobacco Use  Smoking Status Current Some Day Smoker  . Packs/day: 0.25  . Years: 20.00  . Pack years: 5.00  . Types: Cigars, Pipe  Smokeless Tobacco Former Systems developer  . Types: Chew  Tobacco Comment   Smokes about 2 cigars a week. He has no intentions of quitting cigar usage.12/23/2019    Goals Met:  Proper associated with RPD/PD & O2 Sat Independence with exercise equipment Exercise tolerated well No report of cardiac concerns or symptoms  Goals Unmet:  Not Applicable  Comments: Pt able to follow exercise prescription today without complaint.  Will continue to monitor for progression.    Dr. Emily Filbert is Medical Director for [place] [facility].

## 2020-02-11 ENCOUNTER — Other Ambulatory Visit: Payer: Self-pay

## 2020-02-11 DIAGNOSIS — J984 Other disorders of lung: Secondary | ICD-10-CM | POA: Diagnosis not present

## 2020-02-11 NOTE — Progress Notes (Signed)
Daily Session Note  Patient Details  Name: Nicholas Douglas MRN: [id number] Date of Birth: 1931/09/03 Referring Provider:   Flowsheet Row Pulmonary Rehab from 01/04/2020 in [facility] Cardiac and Pulmonary Rehab  Referring Provider [name] MD      Encounter Date: 02/11/2020  Check In:  Session Check In - 02/11/20 0718      Check-In   Supervising physician immediately available to respond to emergencies See telemetry face sheet for immediately available ER MD    Location [facility]-Cardiac & Pulmonary Rehab    Staff Present Birdie Sons, MPA, RN;Joseph Darrin Nipper, [place], RCEP, CCRP, CCET    Virtual Visit No    Medication changes reported     No    Fall or balance concerns reported    No    Tobacco Cessation No Change    Current number of cigarettes/nicotine per day     0    Warm-up and Cool-down Performed on first and last piece of equipment    Resistance Training Performed Yes    VAD Patient? No    PAD/SET Patient? No      Pain Assessment   Currently in Pain? No/denies              Social History   Tobacco Use  Smoking Status Current Some Day Smoker  . Packs/day: 0.25  . Years: 20.00  . Pack years: 5.00  . Types: Cigars, Pipe  Smokeless Tobacco Former Systems developer  . Types: Chew  Tobacco Comment   Smokes about 2 cigars a week. He has no intentions of quitting cigar usage.12/23/2019    Goals Met:  Independence with exercise equipment Exercise tolerated well Personal goals reviewed No report of cardiac concerns or symptoms Strength training completed today  Goals Unmet:  Not Applicable  Comments: Pt able to follow exercise prescription today without complaint.  Will continue to monitor for progression.    Dr. Emily Filbert is Medical Director for [place] and [facility].

## 2020-02-15 ENCOUNTER — Other Ambulatory Visit: Payer: Self-pay

## 2020-02-15 ENCOUNTER — Encounter: Payer: Medicare Other | Admitting: *Deleted

## 2020-02-15 DIAGNOSIS — J984 Other disorders of lung: Secondary | ICD-10-CM

## 2020-02-15 NOTE — Progress Notes (Signed)
Daily Session Note  Patient Details  Name: Nicholas Douglas MRN: [id number] Date of Birth: 13-Jan-1932 Referring Provider:   Flowsheet Row Pulmonary Rehab from 01/04/2020 in [facility] Cardiac and Pulmonary Rehab  Referring Provider [name] MD      Encounter Date: 02/15/2020  Check In:  Session Check In - 02/15/20 0829      Check-In   Supervising physician immediately available to respond to emergencies See telemetry face sheet for immediately available ER MD    Location [facility]-Cardiac & Pulmonary Rehab    Staff Present Heath Lark, RN, BSN, Jacklynn Bue, MS Exercise Physiologist;Amanda Oletta Darter, [place], ACSM CEP, Exercise Physiologist    Virtual Visit No    Medication changes reported     No    Fall or balance concerns reported    No    Warm-up and Cool-down Performed on first and last piece of equipment    Resistance Training Performed Yes    VAD Patient? No    PAD/SET Patient? No      Pain Assessment   Currently in Pain? No/denies              Social History   Tobacco Use  Smoking Status Current Some Day Smoker  . Packs/day: 0.25  . Years: 20.00  . Pack years: 5.00  . Types: Cigars, Pipe  Smokeless Tobacco Former Systems developer  . Types: Chew  Tobacco Comment   Smokes about 2 cigars a week. He has no intentions of quitting cigar usage.12/23/2019    Goals Met:  Proper associated with RPD/PD & O2 Sat Independence with exercise equipment Exercise tolerated well No report of cardiac concerns or symptoms  Goals Unmet:  Not Applicable  Comments: Pt able to follow exercise prescription today without complaint.  Will continue to monitor for progression.    Dr. Emily Filbert is Medical Director for [place] and [facility].

## 2020-02-17 ENCOUNTER — Other Ambulatory Visit: Payer: Self-pay

## 2020-02-17 DIAGNOSIS — J984 Other disorders of lung: Secondary | ICD-10-CM

## 2020-02-17 NOTE — Progress Notes (Signed)
Daily Session Note  Patient Details  Name: Nicholas Douglas MRN: [id number] Date of Birth: 06/26/1931 Referring Provider:   Flowsheet Row Pulmonary Rehab from 01/04/2020 in [facility] Cardiac and Pulmonary Rehab  Referring Provider [name] MD      Encounter Date: 02/17/2020  Check In:  Session Check In - 02/17/20 0718      Check-In   Supervising physician immediately available to respond to emergencies See telemetry face sheet for immediately available ER MD    Location [facility]-Cardiac & Pulmonary Rehab    Staff Present Birdie Sons, MPA, RN;Amanda Oletta Darter, BA, ACSM CEP, Exercise Physiologist;Kara Eliezer Bottom, MS Exercise Physiologist    Virtual Visit No    Medication changes reported     No    Fall or balance concerns reported    No    Tobacco Cessation No Change    Current number of cigarettes/nicotine per day     0    Warm-up and Cool-down Performed on first and last piece of equipment    Resistance Training Performed Yes    VAD Patient? No    PAD/SET Patient? No      Pain Assessment   Currently in Pain? No/denies              Social History   Tobacco Use  Smoking Status Current Some Day Smoker  . Packs/day: 0.25  . Years: 20.00  . Pack years: 5.00  . Types: Cigars, Pipe  Smokeless Tobacco Former Systems developer  . Types: Chew  Tobacco Comment   Smokes about 2 cigars a week. He has no intentions of quitting cigar usage.12/23/2019    Goals Met:  Independence with exercise equipment Exercise tolerated well No report of cardiac concerns or symptoms Strength training completed today  Goals Unmet:  Not Applicable  Comments: Pt able to follow exercise prescription today without complaint.  Will continue to monitor for progression.    Dr. Emily Filbert is Medical Director for [place] and [facility].

## 2020-02-24 ENCOUNTER — Other Ambulatory Visit: Payer: Self-pay

## 2020-02-24 DIAGNOSIS — J984 Other disorders of lung: Secondary | ICD-10-CM

## 2020-02-24 NOTE — Progress Notes (Signed)
Daily Session Note  Patient Details  Name: KEWON STATLER MRN: [id number] Date of Birth: Apr 04, 1931 Referring Provider:   Flowsheet Row Pulmonary Rehab from 01/04/2020 in [facility] Cardiac and Pulmonary Rehab  Referring Provider [name] MD      Encounter Date: 02/24/2020  Check In:  Session Check In - 02/24/20 0718      Check-In   Supervising physician immediately available to respond to emergencies See telemetry face sheet for immediately available ER MD    Location [facility]-Cardiac & Pulmonary Rehab    Staff Present Birdie Sons, MPA, RN;Amanda Sommer, BA, ACSM CEP, Exercise Physiologist;Melissa Caiola RDN, LDN    Virtual Visit No    Medication changes reported     No    Fall or balance concerns reported    No    Tobacco Cessation No Change    Current number of cigarettes/nicotine per day     0    Warm-up and Cool-down Performed on first and last piece of equipment    Resistance Training Performed Yes    VAD Patient? No    PAD/SET Patient? No      Pain Assessment   Currently in Pain? No/denies              Social History   Tobacco Use  Smoking Status Current Some Day Smoker  . Packs/day: 0.25  . Years: 20.00  . Pack years: 5.00  . Types: Cigars, Pipe  Smokeless Tobacco Former Systems developer  . Types: Chew  Tobacco Comment   Smokes about 2 cigars a week. He has no intentions of quitting cigar usage.12/23/2019    Goals Met:  Independence with exercise equipment Exercise tolerated well No report of cardiac concerns or symptoms Strength training completed today  Goals Unmet:  Not Applicable  Comments: Pt able to follow exercise prescription today without complaint.  Will continue to monitor for progression.    Dr. Emily Filbert is Medical Director for [place] and [facility].

## 2020-03-01 ENCOUNTER — Encounter: Payer: Self-pay | Admitting: *Deleted

## 2020-03-01 ENCOUNTER — Other Ambulatory Visit: Payer: Self-pay

## 2020-03-01 DIAGNOSIS — J984 Other disorders of lung: Secondary | ICD-10-CM | POA: Diagnosis not present

## 2020-03-01 NOTE — Progress Notes (Signed)
Daily Session Note  Patient Details  Name: Nicholas Douglas MRN: [id number] Date of Birth: 05-30-1931 Referring Provider:   Flowsheet Row Pulmonary Rehab from 01/04/2020 in [facility] Cardiac and Pulmonary Rehab  Referring Provider [name] MD      Encounter Date: 03/01/2020  Check In:  Session Check In - 03/01/20 0717      Check-In   Supervising physician immediately available to respond to emergencies See telemetry face sheet for immediately available ER MD    Location [facility]-Cardiac & Pulmonary Rehab    Staff Present Alberteen Sam, MA, RCEP, CCRP, CCET;Haja Crego [place], MPA, RN;Amanda Sommer, BA, ACSM CEP, Exercise Physiologist    Virtual Visit No    Medication changes reported     No    Fall or balance concerns reported    No    Tobacco Cessation No Change    Current number of cigarettes/nicotine per day     0    Warm-up and Cool-down Performed on first and last piece of equipment    Resistance Training Performed Yes    VAD Patient? No    PAD/SET Patient? No      Pain Assessment   Currently in Pain? No/denies              Social History   Tobacco Use  Smoking Status Current Some Day Smoker  . Packs/day: 0.25  . Years: 20.00  . Pack years: 5.00  . Types: Cigars, Pipe  Smokeless Tobacco Former Systems developer  . Types: Chew  Tobacco Comment   Smokes about 2 cigars a week. He has no intentions of quitting cigar usage.12/23/2019    Goals Met:  Independence with exercise equipment Exercise tolerated well No report of cardiac concerns or symptoms Strength training completed today  Goals Unmet:  Not Applicable  Comments: Pt able to follow exercise prescription today without complaint.  Will continue to monitor for progression.    Dr. Emily Filbert is Medical Director for [place] and [facility].

## 2020-03-01 NOTE — Progress Notes (Signed)
Pulmonary Individual Treatment Plan  Patient Details  Name: Nicholas Douglas MRN: [id number] Date of Birth: 1931-08-16 Referring Provider:   Flowsheet Row Pulmonary Rehab from 01/04/2020 in [facility] Cardiac and Pulmonary Rehab  Referring Provider [name] MD      Initial Encounter Date:  Flowsheet Row Pulmonary Rehab from 01/04/2020 in [facility] Cardiac and Pulmonary Rehab  Date 01/04/20      Visit Diagnosis: Restrictive lung disease  Patient's Home Medications on Admission:  Current Outpatient Medications:  .  acetaminophen (TYLENOL) 500 MG tablet, Take 500 mg by mouth every 6 (six) hours as needed (for pain.). , Disp: , Rfl:  .  amLODipine (NORVASC) 10 MG tablet, Take 1 tablet (10 mg total) by mouth every evening., Disp: 90 tablet, Rfl: 3 .  aspirin 81 MG tablet, Take 81 mg by mouth every evening. , Disp: , Rfl:  .  atenolol (TENORMIN) 25 MG tablet, TAKE ONE-HALF (1/2) TABLET  BY MOUTH TWICE A DAY, Disp: 90 tablet, Rfl: 3 .  Carboxymethylcell-Hypromellose (GENTEAL OP), Apply 1 drop to eye 2 (two) times daily. , Disp: , Rfl:  .  clopidogrel (PLAVIX) 75 MG tablet, Take 1 tablet (75 mg total) by mouth daily., Disp: 90 tablet, Rfl: 3 .  ezetimibe (ZETIA) 10 MG tablet, TAKE 1 TABLET BY MOUTH  DAILY, Disp: 90 tablet, Rfl: 3 .  finasteride (PROSCAR) 5 MG tablet, Take 1 tablet (5 mg total) by mouth daily. (Patient taking differently: Take 2.5 mg by mouth daily. ), Disp: 90 tablet, Rfl: 3 .  isosorbide mononitrate (IMDUR) 60 MG 24 hr tablet, TAKE 1 TABLET BY MOUTH  DAILY, Disp: 90 tablet, Rfl: 3 .  nitroGLYCERIN (NITROSTAT) 0.4 MG SL tablet, PLACE 1 TABLET (0.4 MG TOTAL) UNDER THE TONGUE EVERY 5 (FIVE) MINUTES AS NEEDED FOR CHEST PAIN., Disp: 25 tablet, Rfl: 11 .  Pitavastatin Calcium (LIVALO) 4 MG TABS, Take 1 tablet (4 mg total) by mouth every evening., Disp: 90 tablet, Rfl: 3 .  spironolactone (ALDACTONE) 25 MG tablet, Take 0.5 tablets (12.5 mg total) by mouth daily., Disp: 45 tablet, Rfl: 3 .   traZODone (DESYREL) 50 MG tablet, , Disp: , Rfl:  .  VASCEPA 1 g capsule, TAKE 1 CAPSULE BY MOUTH  TWICE DAILY, Disp: 180 capsule, Rfl: 3  Past Medical History: Past Medical History:  Diagnosis Date  . BPH (benign prostatic hyperplasia)    Followed by [name]/urology  . CAD S/P PTCA only of RPAV-PL 05/31/2015   99% --> 0%PAV - PTCA only (unable to advance STENT) due to RCA calcification - prox & distal RCA 30%; Patent LAD stent & Cx stent (~20% ISR).   . Calculus of kidney    "that's how they found the cancer" (04/27/2013)  . Coronary artery disease involving native coronary artery of native heart with unstable angina pectoris ([place]) 12/03/2011   S/P Cardiac angioplasty 1986, 1991, 1993, 1997.  Last cardiac catheterization 2001.  Cardiolite 05/2011 low risk with normal EF 65%.  Followed by cardiology/[name] every six months.   . History of myocardial infarction 1976  . Hypertension   . Impotence of organic origin   . Internal hemorrhoids without mention of complication   . Malignant neoplasm of kidney, except pelvis   . Melanoma ([place]) 06/2010   Left arm  . 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  . Other abnormal blood chemistry   . Other nonspecific abnormal serum enzyme levels   . Personal history of  colonic polyps   . Pure hypercholesterolemia   . Tobacco use disorder   . Unspecified congenital cystic kidney disease   . Unstable angina ([place]) 04/05/2013; 05/2015    Tobacco Use: Social History   Tobacco Use  Smoking Status Current Some Day Smoker  . Packs/day: 0.25  . Years: 20.00  . Pack years: 5.00  . Types: Cigars, Pipe  Smokeless Tobacco Former Systems developer  . Types: Chew  Tobacco Comment   Smokes about 2 cigars a week. He has no intentions of quitting cigar usage.12/23/2019    Labs: Recent Review Flowsheet Data    Labs for [facility]P Cardiac and Pulmonary Rehab Latest Ref Rng & Units 11/07/2017 11/18/2017 05/25/2018 05/13/2019 11/23/2019   Cholestrol  100 - 199 mg/dL 148 124 146 132 152   LDLCALC 0 - 99 mg/dL 66 57 72 62 78   HDL >39 mg/dL 57 51 52 49 52   Trlycerides 0 - 149 mg/dL 124 82 109 116 122   Hemoglobin A1c <5.7 % - - - - -       Pulmonary Assessment Scores:  Pulmonary Assessment Scores    Row Name 01/04/20 1549         ADL UCSD   ADL Phase Entry     SOB Score total 30     Rest 0     Walk 2     Stairs 3     Bath 2     Dress 0     Shop 2           CAT Score   CAT Score 15           mMRC Score   mMRC Score 1            UCSD: Self-administered rating of dyspnea associated with activities of daily living (ADLs) 6-point scale (0 = "not at all" to 5 = "maximal or unable to do because of breathlessness")  Scoring Scores range from 0 to 120.  Minimally important difference is 5 units  CAT: CAT can identify the health impairment of COPD patients and is better correlated with disease progression.  CAT has a scoring range of zero to 40. The CAT score is classified into four groups of low (less than 10), medium (10 - 20), high (21-30) and very high (31-40) based on the impact level of disease on health status. A CAT score over 10 suggests significant symptoms.  A worsening CAT score could be explained by an exacerbation, poor medication adherence, poor inhaler technique, or progression of COPD or comorbid conditions.  CAT MCID is 2 points  mMRC: mMRC (Modified Medical Research Council) Dyspnea Scale is used to assess the degree of baseline functional disability in patients of respiratory disease due to dyspnea. No minimal important difference is established. A decrease in score of 1 point or greater is considered a positive change.   Pulmonary Function Assessment:  Pulmonary Function Assessment - 12/23/19 0901      Breath   Shortness of Breath No           Exercise Target Goals: Exercise Program Goal: Individual exercise prescription set using results from initial 6 min walk test and THRR while  considering  patient's activity barriers and safety.   Exercise Prescription Goal: Initial exercise prescription builds to 30-45 minutes a day of aerobic activity, 2-3 days per week.  Home exercise guidelines will be given to patient during program as part of exercise prescription that the participant will acknowledge.  Education: Aerobic Exercise: - Group verbal and visual presentation on the components of exercise prescription. Introduces F.I.T.T principle from ACSM for exercise prescriptions.  Reviews F.I.T.T. principles of aerobic exercise including progression. Written material given at graduation.   Education: Resistance Exercise: - Group verbal and visual presentation on the components of exercise prescription. Introduces F.I.T.T principle from ACSM for exercise prescriptions  Reviews F.I.T.T. principles of resistance exercise including progression. Written material given at graduation.    Education: Exercise & Equipment Safety: - Individual verbal instruction and demonstration of equipment use and safety with use of the equipment. Flowsheet Row Pulmonary Rehab from 02/24/2020 in [facility] Cardiac and Pulmonary Rehab  Date 12/23/19  Educator [facility]  Instruction Review Code 1- Verbalizes Understanding      Education: Exercise Physiology & General Exercise Guidelines: - Group verbal and written instruction with models to review the exercise physiology of the cardiovascular system and associated critical values. Provides general exercise guidelines with specific guidelines to those with heart or lung disease.    Education: Flexibility, Balance, Mind/Body Relaxation: - Group verbal and visual presentation with interactive activity on the components of exercise prescription. Introduces F.I.T.T principle from ACSM for exercise prescriptions. Reviews F.I.T.T. principles of flexibility and balance exercise training including progression. Also discusses the mind body connection.  Reviews various  relaxation techniques to help reduce and manage stress (i.e. Deep breathing, progressive muscle relaxation, and visualization). Balance handout provided to take home. Written material given at graduation.   Activity Barriers & Risk Stratification:  Activity Barriers & Cardiac Risk Stratification - 01/04/20 1546      Activity Barriers & Cardiac Risk Stratification   Activity Barriers Shortness of Breath;Joint Problems;Deconditioning           6 Minute Walk:  6 Minute Walk    Row Name 01/04/20 1536         6 Minute Walk   Phase Initial     Distance 1392 feet     Walk Time 6 minutes     # of Rest Breaks 0     MPH 2.63     METS 2.25     RPE 11     Perceived Dyspnea  1     VO2 Peak 7.79     Symptoms No     Resting HR 62 bpm     Resting BP 116/64     Resting Oxygen Saturation  95 %     Exercise Oxygen Saturation  during 6 min walk 85 %     Max Ex. HR 98 bpm     Max Ex. BP 156/70     2 Minute Post BP 124/64           Interval HR   1 Minute HR 78     2 Minute HR 91     3 Minute HR 94     4 Minute HR 95     5 Minute HR 95     6 Minute HR 98     2 Minute Post HR 71     Interval Heart Rate? Yes           Interval Oxygen   Interval Oxygen? Yes     Baseline Oxygen Saturation % 95 %     1 Minute Oxygen Saturation % 91 %     1 Minute Liters of Oxygen 0 L  RA     2 Minute Oxygen Saturation % 88 %     2 Minute Liters of Oxygen  0 L     3 Minute Oxygen Saturation % 86 %     3 Minute Liters of Oxygen 0 L     4 Minute Oxygen Saturation % 85 %     4 Minute Liters of Oxygen 0 L     5 Minute Oxygen Saturation % 86 %     5 Minute Liters of Oxygen 0 L     6 Minute Oxygen Saturation % 86 %     6 Minute Liters of Oxygen 0 L     2 Minute Post Oxygen Saturation % 94 %     2 Minute Post Liters of Oxygen 0 L           Oxygen Initial Assessment:  Oxygen Initial Assessment - 01/04/20 1549      Home Oxygen   Home Oxygen Device None    Sleep Oxygen Prescription None    Home  Exercise Oxygen Prescription None    Home Resting Oxygen Prescription None    Compliance with Home Oxygen Use Yes      Initial 6 min Walk   Oxygen Used None      Program Oxygen Prescription   Program Oxygen Prescription None      Intervention   Short Term Goals To learn and understand importance of monitoring SPO2 with pulse oximeter and demonstrate accurate use of the pulse oximeter.;To learn and understand importance of maintaining oxygen saturations>88%;To learn and demonstrate proper pursed lip breathing techniques or other breathing techniques.;To learn and demonstrate proper use of respiratory medications    Long  Term Goals Maintenance of O2 saturations>88%;Verbalizes importance of monitoring SPO2 with pulse oximeter and return demonstration;Exhibits proper breathing techniques, such as pursed lip breathing or other method taught during program session;Demonstrates proper use of MDI's;Compliance with respiratory medication           Oxygen Re-Evaluation:  Oxygen Re-Evaluation    Row Name 01/06/20 0749 02/11/20 0723           Program Oxygen Prescription   Program Oxygen Prescription None None             Home Oxygen   Home Oxygen Device None None      Sleep Oxygen Prescription None None      Home Exercise Oxygen Prescription None None      Home Resting Oxygen Prescription None None      Compliance with Home Oxygen Use Yes Yes             Goals/Expected Outcomes   Short Term Goals To learn and understand importance of monitoring SPO2 with pulse oximeter and demonstrate accurate use of the pulse oximeter.;To learn and understand importance of maintaining oxygen saturations>88%;To learn and demonstrate proper pursed lip breathing techniques or other breathing techniques. To learn and understand importance of maintaining oxygen saturations>88%;To learn and demonstrate proper pursed lip breathing techniques or other breathing techniques.      Long  Term Goals Maintenance of  O2 saturations>88%;Verbalizes importance of monitoring SPO2 with pulse oximeter and return demonstration;Exhibits proper breathing techniques, such as pursed lip breathing or other method taught during program session Maintenance of O2 saturations>88%;Verbalizes importance of monitoring SPO2 with pulse oximeter and return demonstration      Comments Reviewed PLB technique with pt.  Talked about how it works and it's importance in maintaining their exercise saturations. Tadan has been trying to use PLB when he can. He knows that it is difficult to do but why it is important. Informed him  that he needs to be 88 percent and above. He bought a pulse oximeter last week and knows how and when to use it.      Goals/Expected Outcomes Short: Become more profiecient at using PLB.   Long: Become independent at using PLB. Short: get more familiare with pulse oximeter and PLB. Long: use pulse oximeter and PLB independently             Oxygen Discharge (Final Oxygen Re-Evaluation):  Oxygen Re-Evaluation - 02/11/20 0723      Program Oxygen Prescription   Program Oxygen Prescription None      Home Oxygen   Home Oxygen Device None    Sleep Oxygen Prescription None    Home Exercise Oxygen Prescription None    Home Resting Oxygen Prescription None    Compliance with Home Oxygen Use Yes      Goals/Expected Outcomes   Short Term Goals To learn and understand importance of maintaining oxygen saturations>88%;To learn and demonstrate proper pursed lip breathing techniques or other breathing techniques.    Long  Term Goals Maintenance of O2 saturations>88%;Verbalizes importance of monitoring SPO2 with pulse oximeter and return demonstration    Comments Thelbert has been trying to use PLB when he can. He knows that it is difficult to do but why it is important. Informed him that he needs to be 88 percent and above. He bought a pulse oximeter last week and knows how and when to use it.    Goals/Expected Outcomes Short:  get more familiare with pulse oximeter and PLB. Long: use pulse oximeter and PLB independently           Initial Exercise Prescription:  Initial Exercise Prescription - 01/04/20 1500      Date of Initial Exercise RX and Referring Provider   Date 01/04/20    Referring Provider [name] MD      Treadmill   MPH 2.2    Grade 0    Minutes 15    METs 2.68      NuStep   Level 2    SPM 80    Minutes 15    METs 2.2      REL-XR   Level 2    Speed 50    Minutes 15    METs 2.2      Prescription Details   Frequency (times per week) 2    Duration Progress to 30 minutes of continuous aerobic without signs/symptoms of physical distress      Intensity   THRR 40-80% of Max Heartrate 90-119    Ratings of Perceived Exertion 11-13    Perceived Dyspnea 0-4      Progression   Progression Continue to progress workloads to maintain intensity without signs/symptoms of physical distress.      Resistance Training   Training Prescription Yes    Weight 3 lb    Reps 10-15           Perform Capillary Blood Glucose checks as needed.  Exercise Prescription Changes:  Exercise Prescription Changes    Row Name 01/04/20 1500 01/19/20 0900 02/01/20 1400 02/14/20 0900 02/28/20 1400     Response to Exercise   Blood Pressure (Admit) 116/64 124/66 104/58 122/64 118/60   Blood Pressure (Exercise) 156/70 116/66 148/70 130/70 132/68   Blood Pressure (Exit) 124/64 106/60 102/64 122/64 108/62   Heart Rate (Admit) 62 bpm 65 bpm 62 bpm 66 bpm 61 bpm   Heart Rate (Exercise) 98 bpm 99 bpm 99 bpm 90 bpm  86 bpm   Heart Rate (Exit) 62 bpm 74 bpm 74 bpm 71 bpm 75 bpm   Oxygen Saturation (Admit) 95 % 95 % 95 % 94 % 93 %   Oxygen Saturation (Exercise) 85 % 89 % 88 % 93 % 91 %   Oxygen Saturation (Exit) 95 % 95 % 89 % 97 % 93 %   Rating of Perceived Exertion (Exercise) 11 14 11 13 13    Perceived Dyspnea (Exercise) 1 1 0 1 0   Symptoms none none none none none   Comments walk test results - - - -    Duration - Progress to 30 minutes of  aerobic without signs/symptoms of physical distress Continue with 30 min of aerobic exercise without signs/symptoms of physical distress. Continue with 30 min of aerobic exercise without signs/symptoms of physical distress. Continue with 30 min of aerobic exercise without signs/symptoms of physical distress.   Intensity - THRR unchanged THRR unchanged THRR unchanged THRR unchanged     Progression   Progression - Continue to progress workloads to maintain intensity without signs/symptoms of physical distress. Continue to progress workloads to maintain intensity without signs/symptoms of physical distress. Continue to progress workloads to maintain intensity without signs/symptoms of physical distress. Continue to progress workloads to maintain intensity without signs/symptoms of physical distress.   Average METs - 3.82 3.14 3.3 3.3     Resistance Training   Training Prescription - Yes Yes Yes Yes   Weight - 3 lb 5 lb 5 lb 5 lb   Reps - 10-15 10-15 10-15 10-15     Interval Training   Interval Training - No No No No     Treadmill   MPH - 2.2 2.5 - 2.7   Grade - 0 1 - 1.5   Minutes - 15 15 - 15   METs - 2.68 3.26 - 3.63     Recumbant Bike   Level - - - 1 -   Minutes - - - 15 -     NuStep   Level - 2 3 4 4    SPM - - 80 - 80   Minutes - 15 15 15 15    METs - 3.6 3 3.6 2.9     REL-XR   Level - 4 - - -   Minutes - 15 - - -   METs - 5.2 - - -     Biostep-RELP   Level - - - 3 -   Minutes - - - 15 -   METs - - - 3 -     Home Exercise Plan   Plans to continue exercise at - - - Home (comment)  walking on treadmill -   Frequency - - - Add 2 additional days to program exercise sessions. -   Initial Home Exercises Provided - - - 01/25/20 -          Exercise Comments:  Exercise Comments    Row Name 01/06/20 0748           Exercise Comments First full day of exercise!  Patient was oriented to gym and equipment including functions,  settings, policies, and procedures.  Patient's individual exercise prescription and treatment plan were reviewed.  All starting workloads were established based on the results of the 6 minute walk test done at initial orientation visit.  The plan for exercise progression was also introduced and progression will be customized based on patient's performance and goals.  Exercise Goals and Review:  Exercise Goals    Row Name 01/04/20 1545             Exercise Goals   Increase Physical Activity Yes       Intervention Provide advice, education, support and counseling about physical activity/exercise needs.;Develop an individualized exercise prescription for aerobic and resistive training based on initial evaluation findings, risk stratification, comorbidities and participant's personal goals.       Expected Outcomes Short Term: Attend rehab on a regular basis to increase amount of physical activity.;Long Term: Add in home exercise to make exercise part of routine and to increase amount of physical activity.;Long Term: Exercising regularly at least 3-5 days a week.       Increase Strength and Stamina Yes       Intervention Provide advice, education, support and counseling about physical activity/exercise needs.;Develop an individualized exercise prescription for aerobic and resistive training based on initial evaluation findings, risk stratification, comorbidities and participant's personal goals.       Expected Outcomes Short Term: Increase workloads from initial exercise prescription for resistance, speed, and METs.;Short Term: Perform resistance training exercises routinely during rehab and add in resistance training at home;Long Term: Improve cardiorespiratory fitness, muscular endurance and strength as measured by increased METs and functional capacity ( )       Able to understand and use rate of perceived exertion (RPE) scale Yes       Intervention Provide education and  explanation on how to use RPE scale       Expected Outcomes Short Term: Able to use RPE daily in rehab to express subjective intensity level;Long Term:  Able to use RPE to guide intensity level when exercising independently       Able to understand and use Dyspnea scale Yes       Intervention Provide education and explanation on how to use Dyspnea scale       Expected Outcomes Short Term: Able to use Dyspnea scale daily in rehab to express subjective sense of shortness of breath during exertion;Long Term: Able to use Dyspnea scale to guide intensity level when exercising independently       Knowledge and understanding of Target Heart Rate Range (THRR) Yes       Intervention Provide education and explanation of THRR including how the numbers were predicted and where they are located for reference       Expected Outcomes Short Term: Able to state/look up THRR;Short Term: Able to use daily as guideline for intensity in rehab;Long Term: Able to use THRR to govern intensity when exercising independently       Able to check pulse independently Yes       Intervention Provide education and demonstration on how to check pulse in carotid and radial arteries.;Review the importance of being able to check your own pulse for safety during independent exercise       Expected Outcomes Short Term: Able to explain why pulse checking is important during independent exercise;Long Term: Able to check pulse independently and accurately       Understanding of Exercise Prescription Yes       Intervention Provide education, explanation, and written materials on patient's individual exercise prescription       Expected Outcomes Short Term: Able to explain program exercise prescription;Long Term: Able to explain home exercise prescription to exercise independently              Exercise Goals Re-Evaluation :  Exercise Goals Re-Evaluation  [place] Name 01/06/20 0749 01/19/20 0951 01/25/20 0925 02/01/20 1429 02/11/20 0736      Exercise Goal Re-Evaluation   Exercise Goals Review Increase Physical Activity;Able to understand and use rate of perceived exertion (RPE) scale;Knowledge and understanding of Target Heart Rate Range (THRR);Understanding of Exercise Prescription;Increase Strength and Stamina;Able to understand and use Dyspnea scale;Able to check pulse independently Increase Strength and Stamina;Increase Physical Activity;Understanding of Exercise Prescription Increase Strength and Stamina;Increase Physical Activity;Understanding of Exercise Prescription Increase Physical Activity;Increase Strength and Stamina Knowledge and understanding of Target Heart Rate Range (THRR)   Comments Reviewed RPE and dyspnea scales, THR and program prescription with pt today.  Pt voiced understanding and was given a copy of goals to take home. Clair Gulling is off to a good start in rehab.  He has already gotten to 5.2 METs on the XR.  We will continue to monitor his prigress. Reviewed home exercise with pt today.  Pt plans to walk for exercise.  Patient golfs 3x/week but does not really do continuous aerobic exercise. Reviewed THR, pulse, RPE, sign and symptoms, pulse oximetery and when to call 911 or MD.  Also discussed weather considerations and indoor options.  Pt voiced understanding. Clair Gulling had increased TM to 2.7 and 4.5 % grade.  His oxygen dropped to 86% so staff had him rest and coninue at 2.5 and 1 % which kept oxygen 88% and above.  Staff reviewed importance of maintaining O2 above 88%. Curtis states that he plays golf 3 days a week and has been trying to do more exercise. Informed him of his THRR and why he needs to keep within range. Patient verbalizes understanding.   Expected Outcomes Short: Use RPE daily to regulate intensity. Long: Follow program prescription in THR. Short: Continue to attend rehab regularly Long: Continue to follow program prescription Short: Add 1-2 days of exercise at home Long: Exercise independently at home with no  complications Short: keep oxygen above 88% at all times when exercising Long: improve stamina overall Short: when working out at home check HR. Long: check HR independently.   [name] Name 02/28/20 1407             Exercise Goal Re-Evaluation   Exercise Goals Review Increase Physical Activity;Increase Strength and Stamina       Comments Clair Gulling has increased levels on TM He doesnt quite reach THR range.  He attends consistently.  Staff will monitor progress.       Expected Outcomes Short: work towards THR range Long: improve stamina              Discharge Exercise Prescription (Final Exercise Prescription Changes):  Exercise Prescription Changes - 02/28/20 1400      Response to Exercise   Blood Pressure (Admit) 118/60    Blood Pressure (Exercise) 132/68    Blood Pressure (Exit) 108/62    Heart Rate (Admit) 61 bpm    Heart Rate (Exercise) 86 bpm    Heart Rate (Exit) 75 bpm    Oxygen Saturation (Admit) 93 %    Oxygen Saturation (Exercise) 91 %    Oxygen Saturation (Exit) 93 %    Rating of Perceived Exertion (Exercise) 13    Perceived Dyspnea (Exercise) 0    Symptoms none    Duration Continue with 30 min of aerobic exercise without signs/symptoms of physical distress.    Intensity THRR unchanged      Progression   Progression Continue to progress workloads to maintain intensity without signs/symptoms of physical distress.  Average METs 3.3      Resistance Training   Training Prescription Yes    Weight 5 lb    Reps 10-15      Interval Training   Interval Training No      Treadmill   MPH 2.7    Grade 1.5    Minutes 15    METs 3.63      NuStep   Level 4    SPM 80    Minutes 15    METs 2.9           Nutrition:  Target Goals: Understanding of nutrition guidelines, daily intake of sodium 1500mg , cholesterol 200mg , calories 30% from fat and 7% or less from saturated fats, daily to have 5 or more servings of fruits and vegetables.  Education: All About  Nutrition: -Group instruction provided by verbal, written material, interactive activities, discussions, models, and posters to present general guidelines for heart healthy nutrition including fat, fiber, MyPlate, the role of sodium in heart healthy nutrition, utilization of the nutrition label, and utilization of this knowledge for meal planning. Follow up email sent as well. Written material given at graduation.   Biometrics:  Pre Biometrics - 01/04/20 1546      Pre Biometrics   Height 5' 7.5" (1.715 m)    Weight 192 lb 11.2 oz (87.4 kg)    BMI (Calculated) 29.72    Single Leg Stand 4.94 seconds            Nutrition Therapy Plan and Nutrition Goals:  Nutrition Therapy & Goals - 02/07/20 1340      Nutrition Therapy   Diet Heart healthy, low Na    Protein (specify units) 70g    Fiber 30 grams    Whole Grain Foods 3 servings    Saturated Fats 12 max. grams    Fruits and Vegetables 8 servings/day    Sodium 1.5 grams      Personal Nutrition Goals   Nutrition Goal jim would not like to make any changes at this time.    Comments B: bacon and eggs or oatmeal D: pintos, potatoes, vegetables, 1 slice of bread, chicken, salmon, or steak once in a while. No salt added and uses olive oil (he will salt).  S: canned peaches. He has been maintaining weight. His wife cooks for him. He rpeorts not wanting to make any changes at this time. Discussed heart healthy eating,      Intervention Plan   Intervention Prescribe, educate and counsel regarding individualized specific dietary modifications aiming towards targeted core components such as weight, hypertension, lipid management, diabetes, heart failure and other comorbidities.;Nutrition handout(s) given to patient.    Expected Outcomes Short Term Goal: Understand basic principles of dietary content, such as calories, fat, sodium, cholesterol and nutrients.;Short Term Goal: A plan has been developed with personal nutrition goals set during  dietitian appointment.;Long Term Goal: Adherence to prescribed nutrition plan.           Nutrition Assessments:  MEDIFICTS Score Key:  ?70 Need to make dietary changes   40-70 Heart Healthy Diet  ? 40 Therapeutic Level Cholesterol Diet  Flowsheet Row Pulmonary Rehab from 01/04/2020 in [facility] Cardiac and Pulmonary Rehab  Picture Your Plate Total Score on Admission 46     Picture Your Plate Scores:  [id number] Unhealthy dietary pattern with much room for improvement.  41-50 Dietary pattern unlikely to meet recommendations for good health and room for improvement.  51-60 More healthful dietary pattern, with some room  for improvement.   >60 Healthy dietary pattern, although there may be some specific behaviors that could be improved.   Nutrition Goals Re-Evaluation:  Nutrition Goals Re-Evaluation    [place] Name 01/25/20 [phone]             Goals   Nutrition Goal Patient has not completed their initial consultation with the RD at this time. Once scheduled, RD will establish nutrition goals with patient       Comment Patient has not had goals set up with RD at this time. Patient is really focusing on portion control and cutting out white bread.       Expected Outcome Short: Meet with RD Long: Maintain overall healthy diet              Nutrition Goals Discharge (Final Nutrition Goals Re-Evaluation):  Nutrition Goals Re-Evaluation - 01/25/20 0931      Goals   Nutrition Goal Patient has not completed their initial consultation with the RD at this time. Once scheduled, RD will establish nutrition goals with patient    Comment Patient has not had goals set up with RD at this time. Patient is really focusing on portion control and cutting out white bread.    Expected Outcome Short: Meet with RD Long: Maintain overall healthy diet           Psychosocial: Target Goals: Acknowledge presence or absence of significant depression and/or stress, maximize coping skills, provide positive  support system. Participant is able to verbalize types and ability to use techniques and skills needed for reducing stress and depression.   Education: Stress, Anxiety, and Depression - Group verbal and visual presentation to define topics covered.  Reviews how body is impacted by stress, anxiety, and depression.  Also discusses healthy ways to reduce stress and to treat/manage anxiety and depression.  Written material given at graduation.   Education: Sleep Hygiene -Provides group verbal and written instruction about how sleep can affect your health.  Define sleep hygiene, discuss sleep cycles and impact of sleep habits. Review good sleep hygiene tips.    Initial Review & Psychosocial Screening:  Initial Psych Review & Screening - 12/23/19 0904      Initial Review   Current issues with None Identified      Family Dynamics   Good Support System? Yes    Comments He can look to his wife for support and has no concerns at this time.      Barriers   Psychosocial barriers to participate in program There are no identifiable barriers or psychosocial needs.;The patient should benefit from training in stress management and relaxation.      Screening Interventions   Interventions Encouraged to exercise;To provide support and resources with identified psychosocial needs;Provide feedback about the scores to participant    Expected Outcomes Short Term goal: Utilizing psychosocial counselor, staff and physician to assist with identification of specific Stressors or current issues interfering with healing process. Setting desired goal for each stressor or current issue identified.;Long Term Goal: Stressors or current issues are controlled or eliminated.;Short Term goal: Identification and review with participant of any Quality of Life or Depression concerns found by scoring the questionnaire.;Long Term goal: The participant improves quality of Life and PHQ9 Scores as seen by post scores and/or  verbalization of changes           Quality of Life Scores:  Scores of 19 and below usually indicate a poorer quality of life in these areas.  A difference of  2-3 points is a clinically meaningful difference.  A difference of 2-3 points in the total score of the Quality of Life Index has been associated with significant improvement in overall quality of life, self-image, physical symptoms, and general health in studies assessing change in quality of life.  PHQ-9: Recent Review Flowsheet Data    Depression screen [facility] 2/9 01/04/2020 04/20/2015 12/22/2013   Decreased Interest 0 0 0   Down, Depressed, Hopeless 0 0 0   PHQ - 2 Score 0 0 0   Altered sleeping 0 - -   Tired, decreased energy 2 - -   Change in appetite 0 - -   Feeling bad or failure about yourself  0 - -   Trouble concentrating 0 - -   Moving slowly or fidgety/restless 0 - -   Suicidal thoughts 0 - -   PHQ-9 Score 2 - -   Difficult doing work/chores Not difficult at all - -     Interpretation of Total Score  Total Score Depression Severity:  1-4 = Minimal depression, 5-9 = Mild depression, 10-14 = Moderate depression, 15-19 = Moderately severe depression, 20-27 = Severe depression   Psychosocial Evaluation and Intervention:  Psychosocial Evaluation - 12/23/19 0905      Psychosocial Evaluation & Interventions   Interventions Relaxation education;Stress management education;Encouraged to exercise with the program and follow exercise prescription    Comments He can look to his wife for support and has no concerns at this time.    Expected Outcomes Short: Exercise regularly to support mental health and notify staff of any changes. Long: maintain mental health and well being through teaching of rehab or prescribed medications independently.    Continue Psychosocial Services  Follow up required by staff           Psychosocial Re-Evaluation:  Psychosocial Re-Evaluation    Row Name 01/25/20 0934 02/11/20 0734            Psychosocial Re-Evaluation   Current issues with None Identified None Identified      Comments Clair Gulling is doing well mentally. Patient denies any concerns at this time regarding sleep, stress, depression. He states his number of hours of sleep is sometimes limited, however, does not effect hm during the day. He has great support from his wife who looks out for him. He stays social by going golfing 3x/week. Clair Gulling states that he has no issues at this time. His wife says that he is the most laid back person she knows. He states that the Reita Cliche keeps him going and 90 percent of the time he feels great.      Expected Outcomes Short: Continue attending Pulmonary Rehab Long: Maintain positive attitude with continuous exercise Short: Continue to exercise regularly to support mental health and notify staff of any changes. Long: maintain mental health and well being through teaching of rehab or prescribed medications independently.      Interventions Encouraged to attend Pulmonary Rehabilitation for the exercise Encouraged to attend Pulmonary Rehabilitation for the exercise      Continue Psychosocial Services  Follow up required by staff Follow up required by staff             Psychosocial Discharge (Final Psychosocial Re-Evaluation):  Psychosocial Re-Evaluation - 02/11/20 0734      Psychosocial Re-Evaluation   Current issues with None Identified    Comments Clair Gulling states that he has no issues at this time. His wife says that he is the most laid back person she knows.  He states that the Reita Cliche keeps him going and 90 percent of the time he feels great.    Expected Outcomes Short: Continue to exercise regularly to support mental health and notify staff of any changes. Long: maintain mental health and well being through teaching of rehab or prescribed medications independently.    Interventions Encouraged to attend Pulmonary Rehabilitation for the exercise    Continue Psychosocial Services  Follow up required by staff            Education: Education Goals: Education classes will be provided on a weekly basis, covering required topics. Participant will state understanding/return demonstration of topics presented.  Learning Barriers/Preferences:  Learning Barriers/Preferences - 12/23/19 0903      Learning Barriers/Preferences   Learning Barriers None    Learning Preferences None           General Pulmonary Education Topics:  Infection Prevention: - Provides verbal and written material to individual with discussion of infection control including proper hand washing and proper equipment cleaning during exercise session. Flowsheet Row Pulmonary Rehab from 02/24/2020 in [facility] Cardiac and Pulmonary Rehab  Date 12/23/19  Educator [facility]  Instruction Review Code 1- Verbalizes Understanding      Falls Prevention: - Provides verbal and written material to individual with discussion of falls prevention and safety. Flowsheet Row Pulmonary Rehab from 02/24/2020 in [facility] Cardiac and Pulmonary Rehab  Date 12/23/19  Educator [facility]  Instruction Review Code 1- Verbalizes Understanding      Chronic Lung Disease Review: - Group verbal instruction with posters, models, PowerPoint presentations and videos,  to review new updates, new respiratory medications, new advancements in procedures and treatments. Providing information on websites and "800" numbers for continued self-education. Includes information about supplement oxygen, available portable oxygen systems, continuous and intermittent flow rates, oxygen safety, concentrators, and Medicare reimbursement for oxygen. Explanation of Pulmonary Drugs, including class, frequency, complications, importance of spacers, rinsing mouth after steroid MDI's, and proper cleaning methods for nebulizers. Review of basic lung anatomy and physiology related to function, structure, and complications of lung disease. Review of risk factors. Discussion about methods for diagnosing sleep  apnea and types of masks and machines for OSA. Includes a review of the use of types of environmental controls: home humidity, furnaces, filters, dust mite/pet prevention, HEPA vacuums. Discussion about weather changes, air quality and the benefits of nasal washing. Instruction on Warning signs, infection symptoms, calling MD promptly, preventive modes, and value of vaccinations. Review of effective airway clearance, coughing and/or vibration techniques. Emphasizing that all should Create an Action Plan. Written material given at graduation. Flowsheet Row Pulmonary Rehab from 02/24/2020 in [facility] Cardiac and Pulmonary Rehab  Date 01/06/20  Educator jh  Instruction Review Code 1- Verbalizes Understanding      AED/CPR: - Group verbal and written instruction with the use of models to demonstrate the basic use of the AED with the basic ABC's of resuscitation.    Anatomy and Cardiac Procedures: - Group verbal and visual presentation and models provide information about basic cardiac anatomy and function. Reviews the testing methods done to diagnose heart disease and the outcomes of the test results. Describes the treatment choices: Medical Management, Angioplasty, or Coronary Bypass Surgery for treating various heart conditions including Myocardial Infarction, Angina, Valve Disease, and Cardiac Arrhythmias.  Written material given at graduation.   Medication Safety: - Group verbal and visual instruction to review commonly prescribed medications for heart and lung disease. Reviews the medication, class of the drug, and side effects. Includes  the steps to properly store meds and maintain the prescription regimen.  Written material given at graduation.   Other: -Provides group and verbal instruction on various topics (see comments)   Knowledge Questionnaire Score:  Knowledge Questionnaire Score - 01/04/20 1548      Knowledge Questionnaire Score   Pre Score 14/18: Oxygen, Medications             Core Components/Risk Factors/Patient Goals at Admission:  Personal Goals and Risk Factors at Admission - 01/04/20 1547      Core Components/Risk Factors/Patient Goals on Admission    Weight Management Yes;Weight Loss    Intervention Weight Management: Provide education and appropriate resources to help participant work on and attain dietary goals.;Weight Management: Develop a combined nutrition and exercise program designed to reach desired caloric intake, while maintaining appropriate intake of nutrient and fiber, sodium and fats, and appropriate energy expenditure required for the weight goal.;Weight Management/Obesity: Establish reasonable short term and long term weight goals.    Admit Weight 192 lb (87.1 kg)    Goal Weight: Short Term 187 lb (84.8 kg)    Goal Weight: Long Term 182 lb (82.6 kg)    Expected Outcomes Short Term: Continue to assess and modify interventions until short term weight is achieved;Long Term: Adherence to nutrition and physical activity/exercise program aimed toward attainment of established weight goal;Weight Loss: Understanding of general recommendations for a balanced deficit meal plan, which promotes 1-2 lb weight loss per week and includes a negative energy balance of [phone] kcal/d;Understanding recommendations for meals to include 15-35% energy as protein, 25-35% energy from fat, 35-60% energy from carbohydrates, less than 200mg  of dietary cholesterol, 20-35 gm of total fiber daily;Understanding of distribution of calorie intake throughout the day with the consumption of 4-5 meals/snacks    Tobacco Cessation Yes    Number of packs per day Smokes cigars- socially    Intervention Assist the participant in steps to quit. Provide individualized education and counseling about committing to Tobacco Cessation, relapse prevention, and pharmacological support that can be provided by physician.;Advice worker, assist with locating and accessing local/national  Quit Smoking programs, and support quit date choice.    Expected Outcomes Short Term: Will demonstrate readiness to quit, by selecting a quit date.;Short Term: Will quit all tobacco product use, adhering to prevention of relapse plan.;Long Term: Complete abstinence from all tobacco products for at least 12 months from quit date.    Improve shortness of breath with ADL's Yes    Intervention Provide education, individualized exercise plan and daily activity instruction to help decrease symptoms of SOB with activities of daily living.    Expected Outcomes Short Term: Improve cardiorespiratory fitness to achieve a reduction of symptoms when performing ADLs;Long Term: Be able to perform more ADLs without symptoms or delay the onset of symptoms    Hypertension Yes    Intervention Provide education on lifestyle modifcations including regular physical activity/exercise, weight management, moderate sodium restriction and increased consumption of fresh fruit, vegetables, and low fat dairy, alcohol moderation, and smoking cessation.;Monitor prescription use compliance.    Expected Outcomes Short Term: Continued assessment and intervention until BP is < 140/40mm HG in hypertensive participants. < 130/12mm HG in hypertensive participants with diabetes, heart failure or chronic kidney disease.;Long Term: Maintenance of blood pressure at goal levels.    Lipids Yes    Intervention Provide education and support for participant on nutrition & aerobic/resistive exercise along with prescribed medications to achieve LDL 70mg , HDL >40mg .  Expected Outcomes Short Term: Participant states understanding of desired cholesterol values and is compliant with medications prescribed. Participant is following exercise prescription and nutrition guidelines.;Long Term: Cholesterol controlled with medications as prescribed, with individualized exercise RX and with personalized nutrition plan. Value goals: LDL < 70mg , HDL > 40 mg.            Education:Diabetes - Individual verbal and written instruction to review signs/symptoms of diabetes, desired ranges of glucose level fasting, after meals and with exercise. Acknowledge that pre and post exercise glucose checks will be done for 3 sessions at entry of program.   Know Your Numbers and Heart Failure: - Group verbal and visual instruction to discuss disease risk factors for cardiac and pulmonary disease and treatment options.  Reviews associated critical values for Overweight/Obesity, Hypertension, Cholesterol, and Diabetes.  Discusses basics of heart failure: signs/symptoms and treatments.  Introduces Heart Failure Zone chart for action plan for heart failure.  Written material given at graduation.   Core Components/Risk Factors/Patient Goals Review:   Goals and Risk Factor Review    Row Name 01/25/20 0739 02/11/20 0731           Core Components/Risk Factors/Patient Goals Review   Personal Goals Review Weight Management/Obesity;Tobacco Cessation;Improve shortness of breath with ADL's;Lipids;Hypertension Improve shortness of breath with ADL's      Review Clair Gulling is doing well. His SOB has immproved slighly. He really tries to practice using PLB when needed.  Does not have desire to quit cigars at this time as he smokes them socially while he golfs. BP is stable at home and at rehab- checks it couples times/ week, typically runs around 120-130s/70s. Right now, he thinks he gained 1-2 lbs due to extra food with the holidays. He is currently 192 lb and his goal is to get down to 180 lb. He is focusing on small servings and maintain appropriate portion sizes. Wife focuses on healthy diet, cutting out white bread to help. Spoke to patient about their shortness of breath and what they can do to improve. Patient has been informed of breathing techniques when starting the program. Patient is informed to tell staff if they have had any med changes and that certain meds they are taking or not  taking can be causing shortness of breath.      Expected Outcomes - Short: Attend [facility] regularly to improve shortness of breath with ADL's. Long: maintain independence with ADL's             Core Components/Risk Factors/Patient Goals at Discharge (Final Review):   Goals and Risk Factor Review - 02/11/20 0731      Core Components/Risk Factors/Patient Goals Review   Personal Goals Review Improve shortness of breath with ADL's    Review Spoke to patient about their shortness of breath and what they can do to improve. Patient has been informed of breathing techniques when starting the program. Patient is informed to tell staff if they have had any med changes and that certain meds they are taking or not taking can be causing shortness of breath.    Expected Outcomes Short: Attend [facility] regularly to improve shortness of breath with ADL's. Long: maintain independence with ADL's           ITP Comments:  ITP Comments    Row Name 12/23/19 0909 01/04/20 1531 01/05/20 1039 01/06/20 0748 02/02/20 0624   ITP Comments Virtual Visit completed. Patient informed on EP and RD appointment and 6 Minute walk test. Patient also informed of  patient health questionnaires on My Chart. Patient Verbalizes understanding. Visit diagnosis can be found in [facility]/16/2019. Completed 6MWT and gym orientation. Initial ITP created and sent for review to Dr. Emily Filbert, Medical Director. 30 Day review completed. Medical Director ITP review done, changes made as directed, and signed approval by Medical Director. New to program First full day of exercise!  Patient was oriented to gym and equipment including functions, settings, policies, and procedures.  Patient's individual exercise prescription and treatment plan were reviewed.  All starting workloads were established based on the results of the 6 minute walk test done at initial orientation visit.  The plan for exercise progression was also introduced and progression  will be customized based on patient's performance and goals. 30 Day review completed. Medical Director ITP review done, changes made as directed, and signed approval by Medical Director.   [place] Name 02/07/20 1351 03/01/20 0936         ITP Comments Completed initial RD Evaluation 30 Day review completed. Medical Director ITP review done, changes made as directed, and signed approval by Medical Director.             Comments:

## 2020-03-02 DIAGNOSIS — J984 Other disorders of lung: Secondary | ICD-10-CM | POA: Diagnosis not present

## 2020-03-02 NOTE — Progress Notes (Signed)
Daily Session Note  Patient Details  Name: Nicholas Douglas MRN: [id number] Date of Birth: [birth year] Referring Provider:   Flowsheet Row Pulmonary Rehab from 01/04/2020 in [facility] Cardiac and Pulmonary Rehab  Referring Provider [name] MD      Encounter Date: 03/02/2020  Check In:  Session Check In - 03/02/20 7614      Check-In   Supervising physician immediately available to respond to emergencies See telemetry face sheet for immediately available ER MD    Location [facility]-Cardiac & Pulmonary Rehab    Staff Present Birdie Sons, MPA, RN;Laureen Owens Shark, BS, RRT, CPFT;Kara Eliezer Bottom, MS Exercise Physiologist    Virtual Visit No    Medication changes reported     No    Fall or balance concerns reported    No    Tobacco Cessation No Change    Current number of cigarettes/nicotine per day     0    Warm-up and Cool-down Performed on first and last piece of equipment    Resistance Training Performed Yes    VAD Patient? No    PAD/SET Patient? No      Pain Assessment   Currently in Pain? No/denies              Social History   Tobacco Use  Smoking Status Current Some Day Smoker  . Packs/day: 0.25  . Years: 20.00  . Pack years: 5.00  . Types: Cigars, Pipe  Smokeless Tobacco Former Systems developer  . Types: Chew  Tobacco Comment   Smokes about 2 cigars a week. He has no intentions of quitting cigar usage.12/23/2019    Goals Met:  Independence with exercise equipment Exercise tolerated well No report of cardiac concerns or symptoms Strength training completed today  Goals Unmet:  Not Applicable  Comments: Pt able to follow exercise prescription today without complaint.  Will continue to monitor for progression.    Dr. Emily Filbert is Medical Director for [place] and [facility].

## 2020-03-07 ENCOUNTER — Other Ambulatory Visit: Payer: Self-pay

## 2020-03-07 ENCOUNTER — Encounter: Payer: Medicare Other | Attending: Pulmonary Disease | Admitting: *Deleted

## 2020-03-07 DIAGNOSIS — J984 Other disorders of lung: Secondary | ICD-10-CM | POA: Insufficient documentation

## 2020-03-07 NOTE — Progress Notes (Signed)
Daily Session Note  Patient Details  Name: Nicholas Douglas MRN: [id number] Date of Birth: 10/06/1931 Referring Provider:   Flowsheet Row Pulmonary Rehab from 01/04/2020 in [facility] Cardiac and Pulmonary Rehab  Referring Provider [name] MD      Encounter Date: 03/07/2020  Check In:  Session Check In - 03/07/20 0803      Check-In   Supervising physician immediately available to respond to emergencies See telemetry face sheet for immediately available ER MD    Location [facility]-Cardiac & Pulmonary Rehab    Staff Present Heath Lark, RN, BSN, Dorris Singh, MPA, Nino Glow, MS Exercise Physiologist    Virtual Visit No    Medication changes reported     No    Fall or balance concerns reported    No    Warm-up and Cool-down Performed on first and last piece of equipment    Resistance Training Performed Yes    VAD Patient? No    PAD/SET Patient? No      Pain Assessment   Currently in Pain? No/denies              Social History   Tobacco Use  Smoking Status Current Some Day Smoker  . Packs/day: 0.25  . Years: 20.00  . Pack years: 5.00  . Types: Cigars, Pipe  Smokeless Tobacco Former Systems developer  . Types: Chew  Tobacco Comment   Smokes about 2 cigars a week. He has no intentions of quitting cigar usage.12/23/2019    Goals Met:  Proper associated with RPD/PD & O2 Sat Independence with exercise equipment Exercise tolerated well No report of cardiac concerns or symptoms  Goals Unmet:  Not Applicable  Comments: Pt able to follow exercise prescription today without complaint.  Will continue to monitor for progression.    Dr. Emily Filbert is Medical Director for [place] and [facility].

## 2020-03-09 ENCOUNTER — Other Ambulatory Visit: Payer: Self-pay

## 2020-03-09 DIAGNOSIS — J984 Other disorders of lung: Secondary | ICD-10-CM

## 2020-03-09 NOTE — Progress Notes (Signed)
Daily Session Note  Patient Details  Name: Nicholas Douglas MRN: [id number] Date of Birth: 07/01/1931 Referring Provider:   Flowsheet Row Pulmonary Rehab from 01/04/2020 in [facility] Cardiac and Pulmonary Rehab  Referring Provider [name] MD      Encounter Date: 03/09/2020  Check In:  Session Check In - 03/09/20 0729      Check-In   Supervising physician immediately available to respond to emergencies See telemetry face sheet for immediately available ER MD    Location [facility]-Cardiac & Pulmonary Rehab    Staff Present Birdie Sons, MPA, RN;Amanda Sommer, BA, ACSM CEP, Exercise Physiologist;Melissa Caiola RDN, LDN    Virtual Visit No    Medication changes reported     No    Fall or balance concerns reported    No    Tobacco Cessation No Change    Current number of cigarettes/nicotine per day     0    Warm-up and Cool-down Performed on first and last piece of equipment    Resistance Training Performed Yes    VAD Patient? No    PAD/SET Patient? No      Pain Assessment   Currently in Pain? No/denies              Social History   Tobacco Use  Smoking Status Current Some Day Smoker  . Packs/day: 0.25  . Years: 20.00  . Pack years: 5.00  . Types: Cigars, Pipe  Smokeless Tobacco Former Systems developer  . Types: Chew  Tobacco Comment   Smokes about 2 cigars a week. He has no intentions of quitting cigar usage.12/23/2019    Goals Met:  Independence with exercise equipment Exercise tolerated well No report of cardiac concerns or symptoms Strength training completed today  Goals Unmet:  Not Applicable  Comments: Pt able to follow exercise prescription today without complaint.  Will continue to monitor for progression.    Dr. Emily Filbert is Medical Director for [place] and [facility].

## 2020-03-14 ENCOUNTER — Other Ambulatory Visit: Payer: Self-pay

## 2020-03-14 ENCOUNTER — Encounter: Payer: Medicare Other | Admitting: *Deleted

## 2020-03-14 DIAGNOSIS — J984 Other disorders of lung: Secondary | ICD-10-CM

## 2020-03-14 NOTE — Progress Notes (Signed)
Daily Session Note  Patient Details  Name: Nicholas Douglas MRN: [id number] Date of Birth: 05-04-1931 Referring Provider:   April Manson Pulmonary Rehab from 01/04/2020 in [facility] Cardiac and Pulmonary Rehab  Referring Provider [name] MD      Encounter Date: 03/14/2020  Check In:  Session Check In - 03/14/20 0802      Check-In   Supervising physician immediately available to respond to emergencies See telemetry face sheet for immediately available ER MD    Location [facility]-Cardiac & Pulmonary Rehab    Staff Present Coralie Keens, MS Exercise Physiologist;Toriano Aikey, RN, BSN, Lance Sell, BA, ACSM CEP, Exercise Physiologist    Virtual Visit No    Medication changes reported     No    Fall or balance concerns reported    No    Warm-up and Cool-down Performed on first and last piece of equipment    Resistance Training Performed Yes    VAD Patient? No    PAD/SET Patient? No      Pain Assessment   Currently in Pain? No/denies              Social History   Tobacco Use  Smoking Status Current Some Day Smoker  . Packs/day: 0.25  . Years: 20.00  . Pack years: 5.00  . Types: Cigars, Pipe  Smokeless Tobacco Former Systems developer  . Types: Chew  Tobacco Comment   Smokes about 2 cigars a week. He has no intentions of quitting cigar usage.12/23/2019    Goals Met:  Proper associated with RPD/PD & O2 Sat Independence with exercise equipment Exercise tolerated well No report of cardiac concerns or symptoms  Goals Unmet:  Not Applicable  Comments: Pt able to follow exercise prescription today without complaint.  Will continue to monitor for progression.    Dr. Emily Filbert is Medical Director for [place] and [facility].

## 2020-03-15 ENCOUNTER — Telehealth: Payer: Self-pay | Admitting: Cardiovascular Disease

## 2020-03-15 NOTE — Telephone Encounter (Signed)
Dr [name] we need clearance to hold aspirin and Plavix for 5-7 days in this patient prior to laryngoscopic biopsy.  Please respond to CV DIV PRE OP  Thanks  Kerin Ransom PA-C 03/15/2020 4:45 PM

## 2020-03-15 NOTE — Telephone Encounter (Signed)
   [facility] Pre-operative Risk Assessment    HEARTCARE STAFF: - Please ensure there is not already an duplicate clearance open for this procedure. - Under Visit Info/Reason for Call, type in Other and utilize the format Clearance MM/DD/YY or Clearance TBD. Do not use dashes or single digits. - If request is for dental extraction, please clarify the # of teeth to be extracted.  Request for surgical clearance:  1. What type of surgery is being performed? 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 When is this surgery scheduled? TBD based on clearance   3. What type of clearance is required (medical clearance vs. Pharmacy clearance to hold med vs. Both)? Both  4. Are there any medications that need to be held prior to surgery and how long? Plavix and Aspirin for 1 week prior   5. Practice name and name of physician performing surgery? [facility] , Dr. Richardson Landry  6. What is the office phone number? [phone]   7.   What is the office fax number? [phone]   8.   Anesthesia type (None, local, MAC, general) ? general  Patient will be under Anesthesia for approx. 45 min   Nicholas Douglas 03/15/2020, 4:03 PM  _________________________________________________________________   (provider comments below)

## 2020-03-21 ENCOUNTER — Other Ambulatory Visit: Payer: Self-pay

## 2020-03-21 ENCOUNTER — Encounter: Payer: Medicare Other | Admitting: *Deleted

## 2020-03-21 DIAGNOSIS — J984 Other disorders of lung: Secondary | ICD-10-CM

## 2020-03-21 NOTE — Progress Notes (Signed)
Daily Session Note  Patient Details  Name: Nicholas Douglas MRN: [id number] Date of Birth: Feb 06, 1931 Referring Provider:   Flowsheet Row Pulmonary Rehab from 01/04/2020 in [facility] Cardiac and Pulmonary Rehab  Referring Provider [name] MD      Encounter Date: 03/21/2020  Check In:  Session Check In - 03/21/20 0835      Check-In   Supervising physician immediately available to respond to emergencies See telemetry face sheet for immediately available ER MD    Location [facility]-Cardiac & Pulmonary Rehab    Staff Present Heath Lark, RN, BSN, Jacklynn Bue, MS Exercise Physiologist;Amanda Oletta Darter, [place], ACSM CEP, Exercise Physiologist    Virtual Visit No    Medication changes reported     No    Fall or balance concerns reported    No    Warm-up and Cool-down Performed on first and last piece of equipment    Resistance Training Performed Yes    VAD Patient? No    PAD/SET Patient? No      Pain Assessment   Currently in Pain? No/denies              Social History   Tobacco Use  Smoking Status Current Some Day Smoker  . Packs/day: 0.25  . Years: 20.00  . Pack years: 5.00  . Types: Cigars, Pipe  Smokeless Tobacco Former Systems developer  . Types: Chew  Tobacco Comment   Smokes about 2 cigars a week. He has no intentions of quitting cigar usage.12/23/2019    Goals Met:  Proper associated with RPD/PD & O2 Sat Independence with exercise equipment Exercise tolerated well No report of cardiac concerns or symptoms  Goals Unmet:  Not Applicable  Comments: Pt able to follow exercise prescription today without complaint.  Will continue to monitor for progression.    Dr. Emily Filbert is Medical Director for [place] and [facility].

## 2020-03-22 NOTE — Telephone Encounter (Signed)
Still waiting to hear from Dr. [name] Dr [name] we need clearance to hold aspirin and Plavix for 5-7 days in this patient prior to laryngoscopic biopsy.  Please respond to P CV Carney, PA-C    03/22/2020 1:56 PM

## 2020-03-23 ENCOUNTER — Other Ambulatory Visit: Payer: Self-pay

## 2020-03-23 DIAGNOSIS — J984 Other disorders of lung: Secondary | ICD-10-CM

## 2020-03-23 NOTE — Telephone Encounter (Signed)
Okay to hold aspirin and Plavix for procedure

## 2020-03-23 NOTE — Progress Notes (Signed)
Daily Session Note  Patient Details  Name: Nicholas Douglas MRN: [id number] Date of Birth: February 23, 1931 Referring Provider:   Flowsheet Row Pulmonary Rehab from 01/04/2020 in [facility] Cardiac and Pulmonary Rehab  Referring Provider [name] MD      Encounter Date: 03/23/2020  Check In:  Session Check In - 03/23/20 0713      Check-In   Supervising physician immediately available to respond to emergencies See telemetry face sheet for immediately available ER MD    Location [facility]-Cardiac & Pulmonary Rehab    Staff Present Birdie Sons, MPA, RN;Melissa Caiola RDN, Rowe Pavy, BA, ACSM CEP, Exercise Physiologist    Virtual Visit No    Medication changes reported     No    Fall or balance concerns reported    No    Tobacco Cessation No Change    Warm-up and Cool-down Performed on first and last piece of equipment    Resistance Training Performed Yes    VAD Patient? No    PAD/SET Patient? No      Pain Assessment   Currently in Pain? No/denies              Social History   Tobacco Use  Smoking Status Current Some Day Smoker  . Packs/day: 0.25  . Years: 20.00  . Pack years: 5.00  . Types: Cigars, Pipe  Smokeless Tobacco Former Systems developer  . Types: Chew  Tobacco Comment   Smokes about 2 cigars a week. He has no intentions of quitting cigar usage.12/23/2019    Goals Met:  Independence with exercise equipment Exercise tolerated well No report of cardiac concerns or symptoms Strength training completed today  Goals Unmet:  Not Applicable  Comments: Pt able to follow exercise prescription today without complaint.  Will continue to monitor for progression.    Dr. Emily Filbert is Medical Director for [place] and [facility].

## 2020-03-24 NOTE — Telephone Encounter (Signed)
See below

## 2020-03-24 NOTE — Telephone Encounter (Signed)
   Primary Cardiologist: [name], MD  Chart reviewed as part of pre-operative protocol coverage. Patient was contacted 03/24/2020 in reference to pre-operative risk assessment for pending surgery as outlined below.  Nicholas Douglas was last seen on 12/02/19 by Dr. [name].  Since that day, Nicholas Douglas has done well.  Therefore, based on ACC/AHA guidelines, the patient would be at acceptable risk for the planned procedure without further cardiovascular testing.   Per his primary cardiologist he may hold Aspirin and Plavix 1 week prior to procedure. Resume as safely possible at discretion of Dr. Virgia Land.   The patient was advised that if he develops new symptoms prior to surgery to contact our office to arrange for a follow-up visit, and he verbalized understanding.  I will route this recommendation to the requesting party via Epic fax function and remove from pre-op pool. Please call with questions.  Loel Dubonnet, NP 03/24/2020, 9:57 AM

## 2020-03-28 ENCOUNTER — Other Ambulatory Visit: Payer: Self-pay

## 2020-03-28 DIAGNOSIS — J984 Other disorders of lung: Secondary | ICD-10-CM | POA: Diagnosis not present

## 2020-03-28 NOTE — Progress Notes (Signed)
Daily Session Note  Patient Details  Name: Nicholas Douglas MRN: [id number] Date of Birth: April 23, 1931 Referring Provider:   Flowsheet Row Pulmonary Rehab from 01/04/2020 in [facility] Cardiac and Pulmonary Rehab  Referring Provider [name] MD      Encounter Date: 03/28/2020  Check In:  Session Check In - 03/28/20 8864      Check-In   Supervising physician immediately available to respond to emergencies See telemetry face sheet for immediately available ER MD    Location [facility]-Cardiac & Pulmonary Rehab    Staff Present Birdie Sons, MPA, Nino Glow, MS Exercise Physiologist;Amanda Oletta Darter, [place], ACSM CEP, Exercise Physiologist    Virtual Visit No    Medication changes reported     No    Fall or balance concerns reported    No    Tobacco Cessation No Change    Warm-up and Cool-down Performed on first and last piece of equipment    Resistance Training Performed Yes    VAD Patient? No    PAD/SET Patient? No      Pain Assessment   Currently in Pain? No/denies              Social History   Tobacco Use  Smoking Status Current Some Day Smoker  . Packs/day: 0.25  . Years: 20.00  . Pack years: 5.00  . Types: Cigars, Pipe  Smokeless Tobacco Former Systems developer  . Types: Chew  Tobacco Comment   Smokes about 2 cigars a week. He has no intentions of quitting cigar usage.12/23/2019    Goals Met:  Independence with exercise equipment Exercise tolerated well No report of cardiac concerns or symptoms Strength training completed today  Goals Unmet:  Not Applicable  Comments: Pt able to follow exercise prescription today without complaint.  Will continue to monitor for progression.    Dr. Emily Filbert is Medical Director for [place] and [facility].

## 2020-03-29 ENCOUNTER — Encounter: Payer: Self-pay | Admitting: *Deleted

## 2020-03-29 DIAGNOSIS — J984 Other disorders of lung: Secondary | ICD-10-CM

## 2020-03-29 NOTE — Progress Notes (Signed)
Pulmonary Individual Treatment Plan  Patient Details  Name: Nicholas Douglas MRN: [id number] Date of Birth: [birth year] Referring Provider:   Flowsheet Row Pulmonary Rehab from 01/04/2020 in [facility] Cardiac and Pulmonary Rehab  Referring Provider [name] MD      Initial Encounter Date:  Flowsheet Row Pulmonary Rehab from 01/04/2020 in [facility] Cardiac and Pulmonary Rehab  Date 01/04/20      Visit Diagnosis: Restrictive lung disease  Patient's Home Medications on Admission:  Current Outpatient Medications:  .  acetaminophen (TYLENOL) 500 MG tablet, Take 500 mg by mouth every 6 (six) hours as needed (for pain.). , Disp: , Rfl:  .  amLODipine (NORVASC) 10 MG tablet, Take 1 tablet (10 mg total) by mouth every evening., Disp: 90 tablet, Rfl: 3 .  aspirin 81 MG tablet, Take 81 mg by mouth every evening. , Disp: , Rfl:  .  atenolol (TENORMIN) 25 MG tablet, TAKE ONE-HALF (1/2) TABLET  BY MOUTH TWICE A DAY (Patient taking differently: Take 12.5 mg by mouth 2 (two) times daily.), Disp: 90 tablet, Rfl: 3 .  Carboxymethylcell-Hypromellose (GENTEAL OP), Apply 1 drop to eye 2 (two) times daily., Disp: , Rfl:  .  clopidogrel (PLAVIX) 75 MG tablet, Take 1 tablet (75 mg total) by mouth daily., Disp: 90 tablet, Rfl: 3 .  ezetimibe (ZETIA) 10 MG tablet, TAKE 1 TABLET BY MOUTH  DAILY (Patient taking differently: Take 10 mg by mouth daily.), Disp: 90 tablet, Rfl: 3 .  finasteride (PROSCAR) 5 MG tablet, Take 1 tablet (5 mg total) by mouth daily. (Patient taking differently: Take 2.5 mg by mouth daily.), Disp: 90 tablet, Rfl: 3 .  isosorbide mononitrate (IMDUR) 60 MG 24 hr tablet, TAKE 1 TABLET BY MOUTH  DAILY (Patient taking differently: Take 60 mg by mouth daily.), Disp: 90 tablet, Rfl: 3 .  nitroGLYCERIN (NITROSTAT) 0.4 MG SL tablet, PLACE 1 TABLET (0.4 MG TOTAL) UNDER THE TONGUE EVERY 5 (FIVE) MINUTES AS NEEDED FOR CHEST PAIN., Disp: 25 tablet, Rfl: 11 .  Pitavastatin Calcium (LIVALO) 4 MG TABS, Take 1  tablet (4 mg total) by mouth every evening., Disp: 90 tablet, Rfl: 3 .  spironolactone (ALDACTONE) 25 MG tablet, Take 0.5 tablets (12.5 mg total) by mouth daily., Disp: 45 tablet, Rfl: 3 .  traZODone (DESYREL) 50 MG tablet, Take 50 mg by mouth at bedtime as needed for sleep., Disp: , Rfl:  .  VASCEPA 1 g capsule, TAKE 1 CAPSULE BY MOUTH  TWICE DAILY (Patient taking differently: Take 1 g by mouth 2 (two) times daily.), Disp: 180 capsule, Rfl: 3  Past Medical History: Past Medical History:  Diagnosis Date  . BPH (benign prostatic hyperplasia)    Followed by [name]/urology  . CAD S/P PTCA only of RPAV-PL 05/31/2015   99% --> 0%PAV - PTCA only (unable to advance STENT) due to RCA calcification - prox & distal RCA 30%; Patent LAD stent & Cx stent (~20% ISR).   . Calculus of kidney    "that's how they found the cancer" (04/27/2013)  . Coronary artery disease involving native coronary artery of native heart with unstable angina pectoris ([place]) 12/03/2011   S/P Cardiac angioplasty 1986, 1991, 1993, 1997.  Last cardiac catheterization 2001.  Cardiolite 05/2011 low risk with normal EF 65%.  Followed by cardiology/[name] every six months.   . History of myocardial infarction 1976  . Hypertension   . Impotence of organic origin   . Internal hemorrhoids without mention of complication   . Malignant  neoplasm of kidney, except pelvis   . Melanoma ([place]) 06/2010   Left arm  . 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  . Other abnormal blood chemistry   . Other nonspecific abnormal serum enzyme levels   . Personal history of colonic polyps   . Pure hypercholesterolemia   . Tobacco use disorder   . Unspecified congenital cystic kidney disease   . Unstable angina ([place]) 04/05/2013; 05/2015    Tobacco Use: Social History   Tobacco Use  Smoking Status Current Some Day Smoker  . Packs/day: 0.25  . Years: 20.00  . Pack years: 5.00  . Types: Cigars, Pipe  Smokeless Tobacco  Former Systems developer  . Types: Chew  Tobacco Comment   Smokes about 2 cigars a week. He has no intentions of quitting cigar usage.12/23/2019    Labs: Recent Review Flowsheet Data    Labs for [facility] Cardiac and Pulmonary Rehab Latest Ref Rng & Units 11/07/2017 11/18/2017 05/25/2018 05/13/2019 11/23/2019   Cholestrol 100 - 199 mg/dL 148 124 146 132 152   LDLCALC 0 - 99 mg/dL 66 57 72 62 78   HDL >39 mg/dL 57 51 52 49 52   Trlycerides 0 - 149 mg/dL 124 82 109 116 122   Hemoglobin A1c <5.7 % - - - - -       Pulmonary Assessment Scores:  Pulmonary Assessment Scores    Row Name 01/04/20 1549         ADL UCSD   ADL Phase Entry     SOB Score total 30     Rest 0     Walk 2     Stairs 3     Bath 2     Dress 0     Shop 2           CAT Score   CAT Score 15           mMRC Score   mMRC Score 1            UCSD: Self-administered rating of dyspnea associated with activities of daily living (ADLs) 6-point scale (0 = "not at all" to 5 = "maximal or unable to do because of breathlessness")  Scoring Scores range from 0 to 120.  Minimally important difference is 5 units  CAT: CAT can identify the health impairment of COPD patients and is better correlated with disease progression.  CAT has a scoring range of zero to 40. The CAT score is classified into four groups of low (less than 10), medium (10 - 20), high (21-30) and very high (31-40) based on the impact level of disease on health status. A CAT score over 10 suggests significant symptoms.  A worsening CAT score could be explained by an exacerbation, poor medication adherence, poor inhaler technique, or progression of COPD or comorbid conditions.  CAT MCID is 2 points  mMRC: mMRC (Modified Medical Research Council) Dyspnea Scale is used to assess the degree of baseline functional disability in patients of respiratory disease due to dyspnea. No minimal important difference is established. A decrease in score of 1 point or greater is considered  a positive change.   Pulmonary Function Assessment:  Pulmonary Function Assessment - 12/23/19 0901      Breath   Shortness of Breath No           Exercise Target Goals: Exercise Program Goal: Individual exercise prescription set using results from initial 6 min walk test and THRR while  considering  patient's activity barriers and safety.   Exercise Prescription Goal: Initial exercise prescription builds to 30-45 minutes a day of aerobic activity, 2-3 days per week.  Home exercise guidelines will be given to patient during program as part of exercise prescription that the participant will acknowledge.  Education: Aerobic Exercise: - Group verbal and visual presentation on the components of exercise prescription. Introduces F.I.T.T principle from ACSM for exercise prescriptions.  Reviews F.I.T.T. principles of aerobic exercise including progression. Written material given at graduation.   Education: Resistance Exercise: - Group verbal and visual presentation on the components of exercise prescription. Introduces F.I.T.T principle from ACSM for exercise prescriptions  Reviews F.I.T.T. principles of resistance exercise including progression. Written material given at graduation.    Education: Exercise & Equipment Safety: - Individual verbal instruction and demonstration of equipment use and safety with use of the equipment. Flowsheet Row Pulmonary Rehab from 02/24/2020 in [facility] Cardiac and Pulmonary Rehab  Date 12/23/19  Educator [facility]  Instruction Review Code 1- Verbalizes Understanding      Education: Exercise Physiology & General Exercise Guidelines: - Group verbal and written instruction with models to review the exercise physiology of the cardiovascular system and associated critical values. Provides general exercise guidelines with specific guidelines to those with heart or lung disease.    Education: Flexibility, Balance, Mind/Body Relaxation: - Group verbal and visual  presentation with interactive activity on the components of exercise prescription. Introduces F.I.T.T principle from ACSM for exercise prescriptions. Reviews F.I.T.T. principles of flexibility and balance exercise training including progression. Also discusses the mind body connection.  Reviews various relaxation techniques to help reduce and manage stress (i.e. Deep breathing, progressive muscle relaxation, and visualization). Balance handout provided to take home. Written material given at graduation.   Activity Barriers & Risk Stratification:  Activity Barriers & Cardiac Risk Stratification - 01/04/20 1546      Activity Barriers & Cardiac Risk Stratification   Activity Barriers Shortness of Breath;Joint Problems;Deconditioning           6 Minute Walk:  6 Minute Walk    Row Name 01/04/20 1536         6 Minute Walk   Phase Initial     Distance 1392 feet     Walk Time 6 minutes     # of Rest Breaks 0     MPH 2.63     METS 2.25     RPE 11     Perceived Dyspnea  1     VO2 Peak 7.79     Symptoms No     Resting HR 62 bpm     Resting BP 116/64     Resting Oxygen Saturation  95 %     Exercise Oxygen Saturation  during 6 min walk 85 %     Max Ex. HR 98 bpm     Max Ex. BP 156/70     2 Minute Post BP 124/64           Interval HR   1 Minute HR 78     2 Minute HR 91     3 Minute HR 94     4 Minute HR 95     5 Minute HR 95     6 Minute HR 98     2 Minute Post HR 71     Interval Heart Rate? Yes           Interval Oxygen   Interval Oxygen? Yes  Baseline Oxygen Saturation % 95 %     1 Minute Oxygen Saturation % 91 %     1 Minute Liters of Oxygen 0 L  RA     2 Minute Oxygen Saturation % 88 %     2 Minute Liters of Oxygen 0 L     3 Minute Oxygen Saturation % 86 %     3 Minute Liters of Oxygen 0 L     4 Minute Oxygen Saturation % 85 %     4 Minute Liters of Oxygen 0 L     5 Minute Oxygen Saturation % 86 %     5 Minute Liters of Oxygen 0 L     6 Minute Oxygen Saturation  % 86 %     6 Minute Liters of Oxygen 0 L     2 Minute Post Oxygen Saturation % 94 %     2 Minute Post Liters of Oxygen 0 L           Oxygen Initial Assessment:  Oxygen Initial Assessment - 01/04/20 1549      Home Oxygen   Home Oxygen Device None    Sleep Oxygen Prescription None    Home Exercise Oxygen Prescription None    Home Resting Oxygen Prescription None    Compliance with Home Oxygen Use Yes      Initial 6 min Walk   Oxygen Used None      Program Oxygen Prescription   Program Oxygen Prescription None      Intervention   Short Term Goals To learn and understand importance of monitoring SPO2 with pulse oximeter and demonstrate accurate use of the pulse oximeter.;To learn and understand importance of maintaining oxygen saturations>88%;To learn and demonstrate proper pursed lip breathing techniques or other breathing techniques.;To learn and demonstrate proper use of respiratory medications    Long  Term Goals Maintenance of O2 saturations>88%;Verbalizes importance of monitoring SPO2 with pulse oximeter and return demonstration;Exhibits proper breathing techniques, such as pursed lip breathing or other method taught during program session;Demonstrates proper use of MDI's;Compliance with respiratory medication           Oxygen Re-Evaluation:  Oxygen Re-Evaluation    Row Name 01/06/20 0749 02/11/20 0723 03/14/20 0740         Program Oxygen Prescription   Program Oxygen Prescription None None None           Home Oxygen   Home Oxygen Device None None None     Sleep Oxygen Prescription None None None     Home Exercise Oxygen Prescription None None None     Home Resting Oxygen Prescription None None None     Compliance with Home Oxygen Use Yes Yes Yes           Goals/Expected Outcomes   Short Term Goals To learn and understand importance of monitoring SPO2 with pulse oximeter and demonstrate accurate use of the pulse oximeter.;To learn and understand importance of  maintaining oxygen saturations>88%;To learn and demonstrate proper pursed lip breathing techniques or other breathing techniques. To learn and understand importance of maintaining oxygen saturations>88%;To learn and demonstrate proper pursed lip breathing techniques or other breathing techniques. To learn and understand importance of maintaining oxygen saturations>88%;To learn and demonstrate proper pursed lip breathing techniques or other breathing techniques.     Long  Term Goals Maintenance of O2 saturations>88%;Verbalizes importance of monitoring SPO2 with pulse oximeter and return demonstration;Exhibits proper breathing techniques, such as pursed lip breathing or other method  taught during program session Maintenance of O2 saturations>88%;Verbalizes importance of monitoring SPO2 with pulse oximeter and return demonstration Maintenance of O2 saturations>88%;Verbalizes importance of monitoring SPO2 with pulse oximeter and return demonstration     Comments Reviewed PLB technique with pt.  Talked about how it works and it's importance in maintaining their exercise saturations. Brad has been trying to use PLB when he can. He knows that it is difficult to do but why it is important. Informed him that he needs to be 88 percent and above. He bought a pulse oximeter last week and knows how and when to use it. Nicholas Douglas hasn't been using PLB due to hoarseness in his throat.  He sees Dr for this tomorrow.     Goals/Expected Outcomes Short: Become more profiecient at using PLB.   Long: Become independent at using PLB. Short: get more familiare with pulse oximeter and PLB. Long: use pulse oximeter and PLB independently Short: follow up with Dr Laverta [place]:  get back to using PLB            Oxygen Discharge (Final Oxygen Re-Evaluation):  Oxygen Re-Evaluation - 03/14/20 0740      Program Oxygen Prescription   Program Oxygen Prescription None      Home Oxygen   Home Oxygen Device None    Sleep Oxygen Prescription None     Home Exercise Oxygen Prescription None    Home Resting Oxygen Prescription None    Compliance with Home Oxygen Use Yes      Goals/Expected Outcomes   Short Term Goals To learn and understand importance of maintaining oxygen saturations>88%;To learn and demonstrate proper pursed lip breathing techniques or other breathing techniques.    Long  Term Goals Maintenance of O2 saturations>88%;Verbalizes importance of monitoring SPO2 with pulse oximeter and return demonstration    Comments Nicholas Douglas hasn't been using PLB due to hoarseness in his throat.  He sees Dr for this tomorrow.    Goals/Expected Outcomes Short: follow up with Dr Laverta [place]:  get back to using PLB           Initial Exercise Prescription:  Initial Exercise Prescription - 01/04/20 1500      Date of Initial Exercise RX and Referring Provider   Date 01/04/20    Referring Provider [name] MD      Treadmill   MPH 2.2    Grade 0    Minutes 15    METs 2.68      NuStep   Level 2    SPM 80    Minutes 15    METs 2.2      REL-XR   Level 2    Speed 50    Minutes 15    METs 2.2      Prescription Details   Frequency (times per week) 2    Duration Progress to 30 minutes of continuous aerobic without signs/symptoms of physical distress      Intensity   THRR 40-80% of Max Heartrate 90-119    Ratings of Perceived Exertion 11-13    Perceived Dyspnea 0-4      Progression   Progression Continue to progress workloads to maintain intensity without signs/symptoms of physical distress.      Resistance Training   Training Prescription Yes    Weight 3 lb    Reps 10-15           Perform Capillary Blood Glucose checks as needed.  Exercise Prescription Changes:  Exercise Prescription Changes    Row Name 01/04/20  1500 01/19/20 0900 02/01/20 1400 02/14/20 0900 02/28/20 1400     Response to Exercise   Blood Pressure (Admit) 116/64 124/66 104/58 122/64 118/60   Blood Pressure (Exercise) 156/70 116/66 148/70 130/70  132/68   Blood Pressure (Exit) 124/64 106/60 102/64 122/64 108/62   Heart Rate (Admit) 62 bpm 65 bpm 62 bpm 66 bpm 61 bpm   Heart Rate (Exercise) 98 bpm 99 bpm 99 bpm 90 bpm 86 bpm   Heart Rate (Exit) 62 bpm 74 bpm 74 bpm 71 bpm 75 bpm   Oxygen Saturation (Admit) 95 % 95 % 95 % 94 % 93 %   Oxygen Saturation (Exercise) 85 % 89 % 88 % 93 % 91 %   Oxygen Saturation (Exit) 95 % 95 % 89 % 97 % 93 %   Rating of Perceived Exertion (Exercise) '11 14 11 13 13   ' Perceived Dyspnea (Exercise) 1 1 0 1 0   Symptoms none none none none none   Comments walk test results -- -- -- --   Duration -- Progress to 30 minutes of  aerobic without signs/symptoms of physical distress Continue with 30 min of aerobic exercise without signs/symptoms of physical distress. Continue with 30 min of aerobic exercise without signs/symptoms of physical distress. Continue with 30 min of aerobic exercise without signs/symptoms of physical distress.   Intensity -- THRR unchanged THRR unchanged THRR unchanged THRR unchanged     Progression   Progression -- Continue to progress workloads to maintain intensity without signs/symptoms of physical distress. Continue to progress workloads to maintain intensity without signs/symptoms of physical distress. Continue to progress workloads to maintain intensity without signs/symptoms of physical distress. Continue to progress workloads to maintain intensity without signs/symptoms of physical distress.   Average METs -- 3.82 3.14 3.3 3.3     Resistance Training   Training Prescription -- Yes Yes Yes Yes   Weight -- 3 lb 5 lb 5 lb 5 lb   Reps -- 10-15 10-15 10-15 10-15     Interval Training   Interval Training -- No No No No     Treadmill   MPH -- 2.2 2.5 -- 2.7   Grade -- 0 1 -- 1.5   Minutes -- 15 15 -- 15   METs -- 2.68 3.26 -- 3.63     Recumbant Bike   Level -- -- -- 1 --   Minutes -- -- -- 15 --     NuStep   Level -- '2 3 4 4   ' SPM -- -- 80 -- 80   Minutes -- '15 15 15 15    ' METs -- 3.6 3 3.6 2.9     REL-XR   Level -- 4 -- -- --   Minutes -- 15 -- -- --   METs -- 5.2 -- -- --     Biostep-RELP   Level -- -- -- 3 --   Minutes -- -- -- 15 --   METs -- -- -- 3 --     Home Exercise Plan   Plans to continue exercise at -- -- -- Home (comment)  walking on treadmill --   Frequency -- -- -- Add 2 additional days to program exercise sessions. --   Initial Home Exercises Provided -- -- -- 01/25/20 --   Leonardo Name 03/13/20 0900 03/27/20 1700           Response to Exercise   Blood Pressure (Admit) 108/68 122/64      Blood Pressure (Exercise) 128/72 162/68  Blood Pressure (Exit) 130/66 116/68      Heart Rate (Admit) 59 bpm 55 bpm      Heart Rate (Exercise) 82 bpm 77 bpm      Heart Rate (Exit) 65 bpm 78 bpm      Oxygen Saturation (Admit) 91 % 93 %      Oxygen Saturation (Exercise) 89 % 91 %      Oxygen Saturation (Exit) 93 % 94 %      Rating of Perceived Exertion (Exercise) 12 12      Perceived Dyspnea (Exercise) 1 1      Symptoms none none      Duration Continue with 30 min of aerobic exercise without signs/symptoms of physical distress. Continue with 30 min of aerobic exercise without signs/symptoms of physical distress.      Intensity THRR unchanged THRR unchanged             Progression   Progression Continue to progress workloads to maintain intensity without signs/symptoms of physical distress. Continue to progress workloads to maintain intensity without signs/symptoms of physical distress.      Average METs 3.94 3.5             Resistance Training   Training Prescription Yes Yes      Weight 5 lb 6 lb      Reps 10-15 10-15             Interval Training   Interval Training No No             Treadmill   MPH 3.2 3.2      Grade 2 1.5      Minutes 15 15      METs 4.11 3.92             NuStep   Level 5 5      SPM -- 80      Minutes 15 15      METs 3.7 3.1             Biostep-RELP   Level 4 --      Minutes 15 --      METs 4 --              Home Exercise Plan   Plans to continue exercise at Home (comment)  walking on treadmill --      Frequency Add 2 additional days to program exercise sessions. --      Initial Home Exercises Provided 01/25/20 --             Exercise Comments:  Exercise Comments    Row Name 01/06/20 [phone]           Exercise Comments First full day of exercise!  Patient was oriented to gym and equipment including functions, settings, policies, and procedures.  Patient's individual exercise prescription and treatment plan were reviewed.  All starting workloads were established based on the results of the 6 minute walk test done at initial orientation visit.  The plan for exercise progression was also introduced and progression will be customized based on patient's performance and goals.              Exercise Goals and Review:  Exercise Goals    Row Name 01/04/20 1545             Exercise Goals   Increase Physical Activity Yes       Intervention Provide advice, education, support and counseling about physical activity/exercise needs.;Develop an individualized  exercise prescription for aerobic and resistive training based on initial evaluation findings, risk stratification, comorbidities and participant's personal goals.       Expected Outcomes Short Term: Attend rehab on a regular basis to increase amount of physical activity.;Long Term: Add in home exercise to make exercise part of routine and to increase amount of physical activity.;Long Term: Exercising regularly at least 3-5 days a week.       Increase Strength and Stamina Yes       Intervention Provide advice, education, support and counseling about physical activity/exercise needs.;Develop an individualized exercise prescription for aerobic and resistive training based on initial evaluation findings, risk stratification, comorbidities and participant's personal goals.       Expected Outcomes Short Term: Increase workloads from initial  exercise prescription for resistance, speed, and METs.;Short Term: Perform resistance training exercises routinely during rehab and add in resistance training at home;Long Term: Improve cardiorespiratory fitness, muscular endurance and strength as measured by increased METs and functional capacity (6MWT)       Able to understand and use rate of perceived exertion (RPE) scale Yes       Intervention Provide education and explanation on how to use RPE scale       Expected Outcomes Short Term: Able to use RPE daily in rehab to express subjective intensity level;Long Term:  Able to use RPE to guide intensity level when exercising independently       Able to understand and use Dyspnea scale Yes       Intervention Provide education and explanation on how to use Dyspnea scale       Expected Outcomes Short Term: Able to use Dyspnea scale daily in rehab to express subjective sense of shortness of breath during exertion;Long Term: Able to use Dyspnea scale to guide intensity level when exercising independently       Knowledge and understanding of Target Heart Rate Range (THRR) Yes       Intervention Provide education and explanation of THRR including how the numbers were predicted and where they are located for reference       Expected Outcomes Short Term: Able to state/look up THRR;Short Term: Able to use daily as guideline for intensity in rehab;Long Term: Able to use THRR to govern intensity when exercising independently       Able to check pulse independently Yes       Intervention Provide education and demonstration on how to check pulse in carotid and radial arteries.;Review the importance of being able to check your own pulse for safety during independent exercise       Expected Outcomes Short Term: Able to explain why pulse checking is important during independent exercise;Long Term: Able to check pulse independently and accurately       Understanding of Exercise Prescription Yes       Intervention  Provide education, explanation, and written materials on patient's individual exercise prescription       Expected Outcomes Short Term: Able to explain program exercise prescription;Long Term: Able to explain home exercise prescription to exercise independently              Exercise Goals Re-Evaluation :  Exercise Goals Re-Evaluation    Row Name 01/06/20 0749 01/19/20 0951 01/25/20 0925 02/01/20 1429 02/11/20 0736     Exercise Goal Re-Evaluation   Exercise Goals Review Increase Physical Activity;Able to understand and use rate of perceived exertion (RPE) scale;Knowledge and understanding of Target Heart Rate Range (THRR);Understanding of Exercise Prescription;Increase Strength and Stamina;Able  to understand and use Dyspnea scale;Able to check pulse independently Increase Strength and Stamina;Increase Physical Activity;Understanding of Exercise Prescription Increase Strength and Stamina;Increase Physical Activity;Understanding of Exercise Prescription Increase Physical Activity;Increase Strength and Stamina Knowledge and understanding of Target Heart Rate Range (THRR)   Comments Reviewed RPE and dyspnea scales, THR and program prescription with pt today.  Pt voiced understanding and was given a copy of goals to take home. Nicholas Douglas is off to a good start in rehab.  He has already gotten to 5.2 METs on the XR.  We will continue to monitor his prigress. Reviewed home exercise with pt today.  Pt plans to walk for exercise.  Patient golfs 3x/week but does not really do continuous aerobic exercise. Reviewed THR, pulse, RPE, sign and symptoms, pulse oximetery and when to call 911 or MD.  Also discussed weather considerations and indoor options.  Pt voiced understanding. Nicholas Douglas had increased TM to 2.7 and 4.5 % grade.  His oxygen dropped to 86% so staff had him rest and coninue at 2.5 and 1 % which kept oxygen 88% and above.  Staff reviewed importance of maintaining O2 above 88%. Rathana states that he plays golf 3 days a  week and has been trying to do more exercise. Informed him of his THRR and why he needs to keep within range. Patient verbalizes understanding.   Expected Outcomes Short: Use RPE daily to regulate intensity. Long: Follow program prescription in THR. Short: Continue to attend rehab regularly Long: Continue to follow program prescription Short: Add 1-2 days of exercise at home Long: Exercise independently at home with no complications Short: keep oxygen above 88% at all times when exercising Long: improve stamina overall Short: when working out at home check HR. Long: check HR independently.   [place] Name 02/28/20 1407 03/13/20 0917 03/27/20 1708         Exercise Goal Re-Evaluation   Exercise Goals Review Increase Physical Activity;Increase Strength and Stamina Increase Physical Activity;Increase Strength and Stamina;Understanding of Exercise Prescription Increase Physical Activity;Increase Strength and Stamina     Comments Nicholas Douglas has increased levels on TM He doesnt quite reach THR range.  He attends consistently.  Staff will monitor progress. Nicholas Douglas has continued to do well in rehab.  He is now up to 3.2 mph on the tredamill.  We will continue to monitor his porgress. Jim gradually increases speed on TM after warm up.  He has moved to 6 lb weights for strength work.     Expected Outcomes Short: work towards THR range Long: improve stamina Short: Make sure maintianing spm to keep HR up Long; Continue to improve stamina. Short: continue to exercise consistently at [facility] and home Long: increase overall MET level            Discharge Exercise Prescription (Final Exercise Prescription Changes):  Exercise Prescription Changes - 03/27/20 1700      Response to Exercise   Blood Pressure (Admit) 122/64    Blood Pressure (Exercise) 162/68    Blood Pressure (Exit) 116/68    Heart Rate (Admit) 55 bpm    Heart Rate (Exercise) 77 bpm    Heart Rate (Exit) 78 bpm    Oxygen Saturation (Admit) 93 %    Oxygen Saturation  (Exercise) 91 %    Oxygen Saturation (Exit) 94 %    Rating of Perceived Exertion (Exercise) 12    Perceived Dyspnea (Exercise) 1    Symptoms none    Duration Continue with 30 min of aerobic exercise without signs/symptoms  of physical distress.    Intensity THRR unchanged      Progression   Progression Continue to progress workloads to maintain intensity without signs/symptoms of physical distress.    Average METs 3.5      Resistance Training   Training Prescription Yes    Weight 6 lb    Reps 10-15      Interval Training   Interval Training No      Treadmill   MPH 3.2    Grade 1.5    Minutes 15    METs 3.92      NuStep   Level 5    SPM 80    Minutes 15    METs 3.1           Nutrition:  Target Goals: Understanding of nutrition guidelines, daily intake of sodium <1526m, cholesterol <2037m calories 30% from fat and 7% or less from saturated fats, daily to have 5 or more servings of fruits and vegetables.  Education: All About Nutrition: -Group instruction provided by verbal, written material, interactive activities, discussions, models, and posters to present general guidelines for heart healthy nutrition including fat, fiber, MyPlate, the role of sodium in heart healthy nutrition, utilization of the nutrition label, and utilization of this knowledge for meal planning. Follow up email sent as well. Written material given at graduation.   Biometrics:  Pre Biometrics - 01/04/20 1546      Pre Biometrics   Height 5' 7.5" (1.715 m)    Weight 192 lb 11.2 oz (87.4 kg)    BMI (Calculated) 29.72    Single Leg Stand 4.94 seconds            Nutrition Therapy Plan and Nutrition Goals:  Nutrition Therapy & Goals - 02/07/20 1340      Nutrition Therapy   Diet Heart healthy, low Na    Protein (specify units) 70g    Fiber 30 grams    Whole Grain Foods 3 servings    Saturated Fats 12 max. grams    Fruits and Vegetables 8 servings/day    Sodium 1.5 grams      Personal  Nutrition Goals   Nutrition Goal jim would not like to make any changes at this time.    Comments B: bacon and eggs or oatmeal D: pintos, potatoes, vegetables, 1 slice of bread, chicken, salmon, or steak once in a while. No salt added and uses olive oil (he will salt).  S: canned peaches. He has been maintaining weight. His wife cooks for him. He rpeorts not wanting to make any changes at this time. Discussed heart healthy eating,      Intervention Plan   Intervention Prescribe, educate and counsel regarding individualized specific dietary modifications aiming towards targeted core components such as weight, hypertension, lipid management, diabetes, heart failure and other comorbidities.;Nutrition handout(s) given to patient.    Expected Outcomes Short Term Goal: Understand basic principles of dietary content, such as calories, fat, sodium, cholesterol and nutrients.;Short Term Goal: A plan has been developed with personal nutrition goals set during dietitian appointment.;Long Term Goal: Adherence to prescribed nutrition plan.           Nutrition Assessments:  MEDIFICTS Score Key:  ?70 Need to make dietary changes   40-70 Heart Healthy Diet  ? 40 Therapeutic Level Cholesterol Diet  [facility]  Picture Your Plate Total Score on Admission 46     Picture Your Plate Scores:  <4<51  Unhealthy dietary pattern with much room for improvement.  41-50 Dietary pattern unlikely to meet recommendations for good health and room for improvement.  51-60 More healthful dietary pattern, with some room for improvement.   >60 Healthy dietary pattern, although there may be some specific behaviors that could be improved.   Nutrition Goals Re-Evaluation:  Nutrition Goals Re-Evaluation    Row Name 01/25/20 0931 03/14/20 0729           Goals   Nutrition Goal Patient has not completed their initial consultation with the RD at this  time. Once scheduled, RD will establish nutrition goals with patient --      Comment Patient has not had goals set up with RD at this time. Patient is really focusing on portion control and cutting out white bread. Nicholas Douglas is still watching portion sizes and reducing white bread.      Expected Outcome Short: Meet with RD Long: Maintain overall healthy diet Short:  continue to be aware of portion sizes Long: maintain overall healthy diet             Nutrition Goals Discharge (Final Nutrition Goals Re-Evaluation):  Nutrition Goals Re-Evaluation - 03/14/20 0729      Goals   Comment Nicholas Douglas is still watching portion sizes and reducing white bread.    Expected Outcome Short:  continue to be aware of portion sizes Long: maintain overall healthy diet           Psychosocial: Target Goals: Acknowledge presence or absence of significant depression and/or stress, maximize coping skills, provide positive support system. Participant is able to verbalize types and ability to use techniques and skills needed for reducing stress and depression.   Education: Stress, Anxiety, and Depression - Group verbal and visual presentation to define topics covered.  Reviews how body is impacted by stress, anxiety, and depression.  Also discusses healthy ways to reduce stress and to treat/manage anxiety and depression.  Written material given at graduation.   Education: Sleep Hygiene -Provides group verbal and written instruction about how sleep can affect your health.  Define sleep hygiene, discuss sleep cycles and impact of sleep habits. Review good sleep hygiene tips.    Initial Review & Psychosocial Screening:  Initial Psych Review & Screening - 12/23/19 0904      Initial Review   Current issues with None Identified      Family Dynamics   Good Support System? Yes    Comments He can look to his wife for support and has no concerns at this time.      Barriers   Psychosocial barriers to participate in program  There are no identifiable barriers or psychosocial needs.;The patient should benefit from training in stress management and relaxation.      Screening Interventions   Interventions Encouraged to exercise;To provide support and resources with identified psychosocial needs;Provide feedback about the scores to participant    Expected Outcomes Short Term goal: Utilizing psychosocial counselor, staff and physician to assist with identification of specific Stressors or current issues interfering with healing process. Setting desired goal for each stressor or current issue identified.;Long Term Goal: Stressors or current issues are controlled or eliminated.;Short Term goal: Identification and review with participant of any Quality of Life or Depression concerns found by scoring the questionnaire.;Long Term goal: The participant improves quality of Life and PHQ9 Scores as seen by post scores and/or verbalization of changes           Quality of Life Scores:  Scores  of 19 and below usually indicate a poorer quality of life in these areas.  A difference of  2-3 points is a clinically meaningful difference.  A difference of 2-3 points in the total score of the Quality of Life Index has been associated with significant improvement in overall quality of life, self-image, physical symptoms, and general health in studies assessing change in quality of life.  PHQ-9: Recent Review Flowsheet Data    Depression screen [facility] 2/9 01/04/2020 04/20/2015 12/22/2013   Decreased Interest 0 0 0   Down, Depressed, Hopeless 0 0 0   PHQ - 2 Score 0 0 0   Altered sleeping 0 - -   Tired, decreased energy 2 - -   Change in appetite 0 - -   Feeling bad or failure about yourself  0 - -   Trouble concentrating 0 - -   Moving slowly or fidgety/restless 0 - -   Suicidal thoughts 0 - -   PHQ-9 Score 2 - -   Difficult doing work/chores Not difficult at all - -     Interpretation of Total Score  Total Score Depression Severity:   1-4 = Minimal depression, 5-9 = Mild depression, 10-14 = Moderate depression, 15-19 = Moderately severe depression, 20-27 = Severe depression   Psychosocial Evaluation and Intervention:  Psychosocial Evaluation - 12/23/19 0905      Psychosocial Evaluation & Interventions   Interventions Relaxation education;Stress management education;Encouraged to exercise with the program and follow exercise prescription    Comments He can look to his wife for support and has no concerns at this time.    Expected Outcomes Short: Exercise regularly to support mental health and notify staff of any changes. Long: maintain mental health and well being through teaching of rehab or prescribed medications independently.    Continue Psychosocial Services  Follow up required by staff           Psychosocial Re-Evaluation:  Psychosocial Re-Evaluation    Row Name 01/25/20 [phone] 02/11/20 0734 03/14/20 0728         Psychosocial Re-Evaluation   Current issues with None Identified None Identified --     Comments Nicholas Douglas is doing well mentally. Patient denies any concerns at this time regarding sleep, stress, depression. He states his number of hours of sleep is sometimes limited, however, does not effect hm during the day. He has great support from his wife who looks out for him. He stays social by going golfing 3x/week. Nicholas Douglas states that he has no issues at this time. His wife says that he is the most laid back person she knows. He states that the Reita Cliche keeps him going and 90 percent of the time he feels great. Nicholas Douglas sleeps well and has no stress concerns at this time.     Expected Outcomes Short: Continue attending Pulmonary Rehab Long: Maintain positive attitude with continuous exercise Short: Continue to exercise regularly to support mental health and notify staff of any changes. Long: maintain mental health and well being through teaching of rehab or prescribed medications independently. Short:  continue to exercise regularly  Long:  maintain positive outlook     Interventions Encouraged to attend Pulmonary Rehabilitation for the exercise Encouraged to attend Pulmonary Rehabilitation for the exercise --     Continue Psychosocial Services  Follow up required by staff Follow up required by staff --            Psychosocial Discharge (Final Psychosocial Re-Evaluation):  Psychosocial Re-Evaluation - 03/14/20 7619  Psychosocial Re-Evaluation   Comments Nicholas Douglas sleeps well and has no stress concerns at this time.    Expected Outcomes Short:  continue to exercise regularly Long:  maintain positive outlook           Education: Education Goals: Education classes will be provided on a weekly basis, covering required topics. Participant will state understanding/return demonstration of topics presented.  Learning Barriers/Preferences:  Learning Barriers/Preferences - 12/23/19 0903      Learning Barriers/Preferences   Learning Barriers None    Learning Preferences None           General Pulmonary Education Topics:  Infection Prevention: - Provides verbal and written material to individual with discussion of infection control including proper hand washing and proper equipment cleaning during exercise session. Flowsheet Row Pulmonary Rehab from 02/24/2020 in [facility]hab  Date 12/23/19  Educator [facility]  Instruction Review Code 1- Verbalizes Understanding      Falls Prevention: - Provides verbal and written material to individual with discussion of falls prevention and safety. Flowsheet Row Pulmonary Rehab from 02/24/2020 in [facility] Cardiac and Pulmonary Rehab  Date 12/23/19  Educator [facility]  Instruction Review Code 1- Verbalizes Understanding      Chronic Lung Disease Review: - Group verbal instruction with posters, models, PowerPoint presentations and videos,  to review new updates, new respiratory medications, new advancements in procedures and treatments. Providing information on websites  and "800" numbers for continued self-education. Includes information about supplement oxygen, available portable oxygen systems, continuous and intermittent flow rates, oxygen safety, concentrators, and Medicare reimbursement for oxygen. Explanation of Pulmonary Drugs, including class, frequency, complications, importance of spacers, rinsing mouth after steroid MDI's, and proper cleaning methods for nebulizers. Review of basic lung anatomy and physiology related to function, structure, and complications of lung disease. Review of risk factors. Discussion about methods for diagnosing sleep apnea and types of masks and machines for OSA. Includes a review of the use of types of environmental controls: home humidity, furnaces, filters, dust mite/pet prevention, HEPA vacuums. Discussion about weather changes, air quality and the benefits of nasal washing. Instruction on Warning signs, infection symptoms, calling MD promptly, preventive modes, and value of vaccinations. Review of effective airway clearance, coughing and/or vibration techniques. Emphasizing that all should Create an Action Plan. Written material given at graduation. Flowsheet Row Pulmonary Rehab from 02/24/2020 in [facility] Cardiac and Pulmonary Rehab  Date 01/06/20  Educator jh  Instruction Review Code 1- Verbalizes Understanding      AED/CPR: - Group verbal and written instruction with the use of models to demonstrate the basic use of the AED with the basic ABC's of resuscitation.    Anatomy and Cardiac Procedures: - Group verbal and visual presentation and models provide information about basic cardiac anatomy and function. Reviews the testing methods done to diagnose heart disease and the outcomes of the test results. Describes the treatment choices: Medical Management, Angioplasty, or Coronary Bypass Surgery for treating various heart conditions including Myocardial Infarction, Angina, Valve Disease, and Cardiac Arrhythmias.  Written material  given at graduation.   Medication Safety: - Group verbal and visual instruction to review commonly prescribed medications for heart and lung disease. Reviews the medication, class of the drug, and side effects. Includes the steps to properly store meds and maintain the prescription regimen.  Written material given at graduation.   Other: -Provides group and verbal instruction on various topics (see comments)   Knowledge Questionnaire Score:  Knowledge Questionnaire Score - 01/04/20 1548  Knowledge Questionnaire Score   Pre Score 14/18: Oxygen, Medications            Core Components/Risk Factors/Patient Goals at Admission:  Personal Goals and Risk Factors at Admission - 01/04/20 1547      Core Components/Risk Factors/Patient Goals on Admission    Weight Management Yes;Weight Loss    Intervention Weight Management: Provide education and appropriate resources to help participant work on and attain dietary goals.;Weight Management: Develop a combined nutrition and exercise program designed to reach desired caloric intake, while maintaining appropriate intake of nutrient and fiber, sodium and fats, and appropriate energy expenditure required for the weight goal.;Weight Management/Obesity: Establish reasonable short term and long term weight goals.    Admit Weight 192 lb (87.1 kg)    Goal Weight: Short Term 187 lb (84.8 kg)    Goal Weight: Long Term 182 lb (82.6 kg)    Expected Outcomes Short Term: Continue to assess and modify interventions until short term weight is achieved;Long Term: Adherence to nutrition and physical activity/exercise program aimed toward attainment of established weight goal;Weight Loss: Understanding of general recommendations for a balanced deficit meal plan, which promotes 1-2 lb weight loss per week and includes a negative energy balance of [id number] kcal/d;Understanding recommendations for meals to include 15-35% energy as protein, 25-35% energy from fat,  35-60% energy from carbohydrates, less than 257m of dietary cholesterol, 20-35 gm of total fiber daily;Understanding of distribution of calorie intake throughout the day with the consumption of 4-5 meals/snacks    Tobacco Cessation Yes    Number of packs per day Smokes cigars- socially    Intervention Assist the participant in steps to quit. Provide individualized education and counseling about committing to Tobacco Cessation, relapse prevention, and pharmacological support that can be provided by physician.;OAdvice worker assist with locating and accessing local/national Quit Smoking programs, and support quit date choice.    Expected Outcomes Short Term: Will demonstrate readiness to quit, by selecting a quit date.;Short Term: Will quit all tobacco product use, adhering to prevention of relapse plan.;Long Term: Complete abstinence from all tobacco products for at least 12 months from quit date.    Improve shortness of breath with ADL's Yes    Intervention Provide education, individualized exercise plan and daily activity instruction to help decrease symptoms of SOB with activities of daily living.    Expected Outcomes Short Term: Improve cardiorespiratory fitness to achieve a reduction of symptoms when performing ADLs;Long Term: Be able to perform more ADLs without symptoms or delay the onset of symptoms    Hypertension Yes    Intervention Provide education on lifestyle modifcations including regular physical activity/exercise, weight management, moderate sodium restriction and increased consumption of fresh fruit, vegetables, and low fat dairy, alcohol moderation, and smoking cessation.;Monitor prescription use compliance.    Expected Outcomes Short Term: Continued assessment and intervention until BP is < 140/952mHG in hypertensive participants. < 130/8065mG in hypertensive participants with diabetes, heart failure or chronic kidney disease.;Long Term: Maintenance of blood pressure  at goal levels.    Lipids Yes    Intervention Provide education and support for participant on nutrition & aerobic/resistive exercise along with prescribed medications to achieve LDL <96m35mDL >40mg28m Expected Outcomes Short Term: Participant states understanding of desired cholesterol values and is compliant with medications prescribed. Participant is following exercise prescription and nutrition guidelines.;Long Term: Cholesterol controlled with medications as prescribed, with individualized exercise RX and with personalized nutrition plan. Value goals: LDL < 96mg,34m >  40 mg.           Education:Diabetes - Individual verbal and written instruction to review signs/symptoms of diabetes, desired ranges of glucose level fasting, after meals and with exercise. Acknowledge that pre and post exercise glucose checks will be done for 3 sessions at entry of program.   Know Your Numbers and Heart Failure: - Group verbal and visual instruction to discuss disease risk factors for cardiac and pulmonary disease and treatment options.  Reviews associated critical values for Overweight/Obesity, Hypertension, Cholesterol, and Diabetes.  Discusses basics of heart failure: signs/symptoms and treatments.  Introduces Heart Failure Zone chart for action plan for heart failure.  Written material given at graduation.   Core Components/Risk Factors/Patient Goals Review:   Goals and Risk Factor Review    Row Name 01/25/20 0739 02/11/20 0731 03/14/20 0725         Core Components/Risk Factors/Patient Goals Review   Personal Goals Review Weight Management/Obesity;Tobacco Cessation;Improve shortness of breath with ADL's;Lipids;Hypertension Improve shortness of breath with ADL's Improve shortness of breath with ADL's     Review Nicholas Douglas is doing well. His SOB has immproved slighly. He really tries to practice using PLB when needed.  Does not have desire to quit cigars at this time as he smokes them socially while he  golfs. BP is stable at home and at rehab- checks it couples times/ week, typically runs around 120-130s/70s. Right now, he thinks he gained 1-2 lbs due to extra food with the holidays. He is currently 192 lb and his goal is to get down to 180 lb. He is focusing on small servings and maintain appropriate portion sizes. Wife focuses on healthy diet, cutting out white bread to help. Spoke to patient about their shortness of breath and what they can do to improve. Patient has been informed of breathing techniques when starting the program. Patient is informed to tell staff if they have had any med changes and that certain meds they are taking or not taking can be causing shortness of breath. Nicholas Douglas states he is taking medications as directed.  He has not noticed a significant improvement in shortness of breath.  He sees Dr tomorrow about hoarseness/ clearing his throat.  He hasnt been using PLB due to this.     Expected Outcomes -- Short: Attend [facility] regularly to improve shortness of breath with ADL's. Long: maintain independence with ADL's Short: follow up with Dr Laverta Baltimore:  maintain independence with ADLs            Core Components/Risk Factors/Patient Goals at Discharge (Final Review):   Goals and Risk Factor Review - 03/14/20 0725      Core Components/Risk Factors/Patient Goals Review   Personal Goals Review Improve shortness of breath with ADL's    Review Nicholas Douglas states he is taking medications as directed.  He has not noticed a significant improvement in shortness of breath.  He sees Dr tomorrow about hoarseness/ clearing his throat.  He hasnt been using PLB due to this.    Expected Outcomes Short: follow up with Dr Laverta [place]:  maintain independence with ADLs           ITP Comments:  ITP Comments    Row Name 12/23/19 0909 01/04/20 1531 01/05/20 1039 01/06/20 0748 02/02/20 0624   ITP Comments Virtual Visit completed. Patient informed on EP and RD appointment and 6 Minute walk test. Patient also informed  of patient health questionnaires on My Chart. Patient Verbalizes understanding. Visit diagnosis can be found in [facility]  11/16/2019. Completed 6MWT and gym orientation. Initial ITP created and sent for review to Dr. Emily Filbert, Medical Director. 30 Day review completed. Medical Director ITP review done, changes made as directed, and signed approval by Medical Director. New to program First full day of exercise!  Patient was oriented to gym and equipment including functions, settings, policies, and procedures.  Patient's individual exercise prescription and treatment plan were reviewed.  All starting workloads were established based on the results of the 6 minute walk test done at initial orientation visit.  The plan for exercise progression was also introduced and progression will be customized based on patient's performance and goals. 30 Day review completed. Medical Director ITP review done, changes made as directed, and signed approval by Medical Director.   [place] Name 02/07/20 1351 03/01/20 0936 03/29/20 0701       ITP Comments Completed initial RD Evaluation 30 Day review completed. Medical Director ITP review done, changes made as directed, and signed approval by Medical Director. 30 Day review completed. Medical Director ITP review done, changes made as directed, and signed approval by Medical Director.            Comments:

## 2020-03-30 ENCOUNTER — Other Ambulatory Visit: Payer: Self-pay

## 2020-03-30 DIAGNOSIS — J984 Other disorders of lung: Secondary | ICD-10-CM

## 2020-03-30 NOTE — Progress Notes (Signed)
Daily Session Note  Patient Details  Name: Nicholas Douglas MRN: [id number] Date of Birth: [birth year] Referring Provider:   Flowsheet Row Pulmonary Rehab from 01/04/2020 in [facility] Cardiac and Pulmonary Rehab  Referring Provider [name] MD      Encounter Date: 03/30/2020  Check In:  Session Check In - 03/30/20 0720      Check-In   Supervising physician immediately available to respond to emergencies See telemetry face sheet for immediately available ER MD    Location [facility]-Cardiac & Pulmonary Rehab    Staff Present Birdie Sons, MPA, RN;Amanda Oletta Darter, BA, ACSM CEP, Exercise Physiologist;Melissa Caiola RDN, LDN    Virtual Visit No    Medication changes reported     No    Fall or balance concerns reported    No    Tobacco Cessation No Change    Warm-up and Cool-down Performed on first and last piece of equipment    Resistance Training Performed Yes    VAD Patient? No    PAD/SET Patient? No      Pain Assessment   Currently in Pain? No/denies              Social History   Tobacco Use  Smoking Status Current Some Day Smoker  . Packs/day: 0.25  . Years: 20.00  . Pack years: 5.00  . Types: Cigars, Pipe  Smokeless Tobacco Former Systems developer  . Types: Chew  Tobacco Comment   Smokes about 2 cigars a week. He has no intentions of quitting cigar usage.12/23/2019    Goals Met:  Independence with exercise equipment Exercise tolerated well No report of cardiac concerns or symptoms Strength training completed today  Goals Unmet:  Not Applicable  Comments: Pt able to follow exercise prescription today without complaint.  Will continue to monitor for progression.    Dr. Emily Filbert is Medical Director for [facility] and [facility].

## 2020-03-31 ENCOUNTER — Other Ambulatory Visit
Admission: RE | Admit: 2020-03-31 | Discharge: 2020-03-31 | Disposition: A | Discharge status: Home / Self Care | Payer: Medicare Other | Source: Ambulatory Visit | Attending: Otolaryngology | Admitting: Otolaryngology

## 2020-03-31 HISTORY — DX: Dyspnea, unspecified: R06.00

## 2020-03-31 NOTE — Patient Instructions (Addendum)
Your procedure is scheduled on: Thursday April 06, 2020. Report to Day Surgery inside [facility] 2nd floor (stop by admissions desk first before getting on Elevator). To find out your arrival time please call [phone] between 1PM - 3PM on Wednesday April 05, 2020.  Remember: Instructions that are not followed completely may result in serious medical risk,  up to and including death, or upon the discretion of your surgeon and anesthesiologist your  surgery may need to be rescheduled.     _X__ 1. Do not eat food after midnight the night before your procedure.                 No chewing gum or hard candies. You may drink clear liquids up to 2 hours                 before you are scheduled to arrive for your surgery- DO not drink clear                 liquids within 2 hours of the start of your surgery.                 Clear Liquids include:  water, apple juice without pulp, clear Gatorade, G2 or                  Gatorade Zero (avoid Red/Purple/Blue), Black Coffee or Tea (Do not add                 anything to coffee or tea).  __X__2.  On the morning of surgery brush your teeth with toothpaste and water, you                may rinse your mouth with mouthwash if you wish.  Do not swallow any toothpaste of mouthwash.     _X__ 3.  No Alcohol for 24 hours before or after surgery.   _X__ 4.  Do Not Smoke or use e-cigarettes For 24 Hours Prior to Your Surgery.                 Do not use any chewable tobacco products for at least 6 hours prior to                 Surgery.  _X__  5.  Do not use any recreational drugs (marijuana, cocaine, heroin, ecstasy, MDMA or other)                For at least one week prior to your surgery.  Combination of these drugs with anesthesia                May have life threatening results.  __X_ 6.  Notify your doctor if there is any change in your medical condition      (cold, fever, infections).     Do not wear jewelry, make-up,  hairpins, clips or nail polish. Do not wear lotions, powders, or perfumes. You may wear deodorant. Do not shave 48 hours prior to surgery. Men may shave face and neck. Do not bring valuables to the hospital.    [facility] is not responsible for any belongings or valuables.  Contacts, dentures or bridgework may not be worn into surgery. Leave your suitcase in the car. After surgery it may be brought to your room. For patients admitted to the hospital, discharge time is determined by your treatment team.   Patients discharged the day of surgery will not be allowed to drive  home.   Make arrangements for someone to be with you for the first 24 hours of your Same Day Discharge.   __X__ Take these medicines the morning of surgery with A SIP OF WATER:    1. ezetimibe (ZETIA) 10 MG  2. atenolol (TENORMIN) 25 MG  3. isosorbide mononitrate (IMDUR) 60 MG  4. VASCEPA 1 g     ____ Fleet Enema (as directed)   ____ Use CHG Soap (or wipes) as directed  ____ Use Benzoyl Peroxide Gel as instructed  ____ Use inhalers on the day of surgery  ____ Stop metformin 2 days prior to surgery    ____ Take 1/2 of usual insulin dose the night before surgery. No insulin the morning          of surgery.   __X__ Stopclopidogrel (PLAVIX) 75 MG and aspirin 81 MG as instructed by your doctor.  __X__ Stop Anti-inflammatories    __X__ Stop supplements until after surgery.    __X__ Do not start any herbal supplements before your procedure.     If you have any questions regarding your pre-procedure instructions,  Please call Pre-admit Testing at [phone].

## 2020-04-04 ENCOUNTER — Other Ambulatory Visit
Admission: RE | Admit: 2020-04-04 | Discharge: 2020-04-04 | Disposition: A | Discharge status: Home / Self Care | Payer: Medicare Other | Source: Ambulatory Visit | Attending: Otolaryngology | Admitting: Otolaryngology

## 2020-04-04 ENCOUNTER — Encounter: Payer: Medicare Other | Attending: Pulmonary Disease | Admitting: *Deleted

## 2020-04-04 ENCOUNTER — Other Ambulatory Visit: Payer: Self-pay

## 2020-04-04 ENCOUNTER — Ambulatory Visit: Admit: 2020-04-04 | Payer: Medicare Other | Admitting: Otolaryngology

## 2020-04-04 DIAGNOSIS — Z20822 Contact with and (suspected) exposure to covid-19: Secondary | ICD-10-CM | POA: Insufficient documentation

## 2020-04-04 DIAGNOSIS — J984 Other disorders of lung: Secondary | ICD-10-CM | POA: Diagnosis not present

## 2020-04-04 DIAGNOSIS — Z01812 Encounter for preprocedural laboratory examination: Secondary | ICD-10-CM | POA: Insufficient documentation

## 2020-04-04 LAB — SARS CORONAVIRUS 2 (TAT 6-24 HRS): SARS Coronavirus 2: NEGATIVE

## 2020-04-04 SURGERY — MICROLARYNGOSCOPY
Anesthesia: General | Laterality: Left

## 2020-04-04 NOTE — Progress Notes (Signed)
Daily Session Note  Patient Details  Name: MAN EFFERTZ MRN: [id number] Date of Birth: 11/06/1931 Referring Eliyah Bazzi:   April Manson Pulmonary Rehab from 01/04/2020 in [facility] Cardiac and Pulmonary Rehab  Referring Fritz Cauthon [name] MD      Encounter Date: 04/04/2020  Check In:  Session Check In - 04/04/20 0827      Check-In   Supervising physician immediately available to respond to emergencies See telemetry face sheet for immediately available ER MD    Location [facility]-Cardiac & Pulmonary Rehab    Staff Present Heath Lark, RN, BSN, Lance Sell, BA, ACSM CEP, Exercise Physiologist;Kara Eliezer Bottom, MS Exercise Physiologist    Virtual Visit No    Medication changes reported     No    Fall or balance concerns reported    No    Warm-up and Cool-down Performed on first and last piece of equipment    Resistance Training Performed Yes    VAD Patient? No    PAD/SET Patient? No      Pain Assessment   Currently in Pain? No/denies              Social History   Tobacco Use  Smoking Status Current Some Day Smoker  . Packs/day: 0.25  . Years: 20.00  . Pack years: 5.00  . Types: Cigars, Pipe  Smokeless Tobacco Former Systems developer  . Types: Chew  Tobacco Comment   Smokes about 2 cigars two months     Goals Met:  Proper associated with RPD/PD & O2 Sat Independence with exercise equipment Exercise tolerated well No report of cardiac concerns or symptoms  Goals Unmet:  Not Applicable  Comments: Pt able to follow exercise prescription today without complaint.  Will continue to monitor for progression.    Dr. Emily Filbert is Medical Director for [facility] and [facility].

## 2020-04-06 ENCOUNTER — Ambulatory Visit
Admission: RE | Admit: 2020-04-06 | Discharge: 2020-04-06 | Disposition: A | Discharge status: Home / Self Care | Payer: Medicare Other | Source: Ambulatory Visit | Attending: Otolaryngology | Admitting: Otolaryngology

## 2020-04-06 ENCOUNTER — Ambulatory Visit: Payer: Medicare Other | Admitting: Anesthesiology

## 2020-04-06 ENCOUNTER — Encounter: Payer: Self-pay | Admitting: Otolaryngology

## 2020-04-06 ENCOUNTER — Other Ambulatory Visit: Payer: Self-pay

## 2020-04-06 ENCOUNTER — Encounter
Admission: RE | Disposition: A | Discharge status: Home / Self Care | Payer: Self-pay | Source: Ambulatory Visit | Attending: Otolaryngology

## 2020-04-06 DIAGNOSIS — Z79899 Other long term (current) drug therapy: Secondary | ICD-10-CM | POA: Insufficient documentation

## 2020-04-06 DIAGNOSIS — Z7902 Long term (current) use of antithrombotics/antiplatelets: Secondary | ICD-10-CM | POA: Diagnosis not present

## 2020-04-06 DIAGNOSIS — Z8249 Family history of ischemic heart disease and other diseases of the circulatory system: Secondary | ICD-10-CM | POA: Insufficient documentation

## 2020-04-06 DIAGNOSIS — F17290 Nicotine dependence, other tobacco product, uncomplicated: Secondary | ICD-10-CM | POA: Diagnosis not present

## 2020-04-06 DIAGNOSIS — Z905 Acquired absence of kidney: Secondary | ICD-10-CM | POA: Insufficient documentation

## 2020-04-06 DIAGNOSIS — Z833 Family history of diabetes mellitus: Secondary | ICD-10-CM | POA: Insufficient documentation

## 2020-04-06 DIAGNOSIS — D489 Neoplasm of uncertain behavior, unspecified: Secondary | ICD-10-CM | POA: Diagnosis present

## 2020-04-06 DIAGNOSIS — Z7982 Long term (current) use of aspirin: Secondary | ICD-10-CM | POA: Diagnosis not present

## 2020-04-06 DIAGNOSIS — Z885 Allergy status to narcotic agent status: Secondary | ICD-10-CM | POA: Insufficient documentation

## 2020-04-06 DIAGNOSIS — H903 Sensorineural hearing loss, bilateral: Secondary | ICD-10-CM | POA: Insufficient documentation

## 2020-04-06 DIAGNOSIS — F17200 Nicotine dependence, unspecified, uncomplicated: Secondary | ICD-10-CM | POA: Insufficient documentation

## 2020-04-06 DIAGNOSIS — C320 Malignant neoplasm of glottis: Secondary | ICD-10-CM | POA: Diagnosis not present

## 2020-04-06 HISTORY — PX: MICROLARYNGOSCOPY: SHX5208

## 2020-04-06 LAB — GLUCOSE, CAPILLARY: Glucose-Capillary: 130 mg/dL — ABNORMAL HIGH (ref 70–99)

## 2020-04-06 SURGERY — MICROLARYNGOSCOPY
Anesthesia: General | Site: Throat

## 2020-04-06 MED ORDER — HYDROCODONE-ACETAMINOPHEN 5-325 MG PO TABS
1.0000 | ORAL_TABLET | Freq: Four times a day (QID) | ORAL | 0 refills | Status: DC | PRN
Start: 2020-04-06 — End: 2020-04-14

## 2020-04-06 MED ORDER — LIDOCAINE-EPINEPHRINE (PF) 1 %-1:200000 IJ SOLN
INTRAMUSCULAR | Status: AC
  Filled 2020-04-06: qty 30

## 2020-04-06 MED ORDER — FENTANYL CITRATE (PF) 100 MCG/2ML IJ SOLN
INTRAMUSCULAR | Status: DC | PRN
  Administered 2020-04-06: 50 ug via INTRAVENOUS

## 2020-04-06 MED ORDER — FENTANYL CITRATE (PF) 100 MCG/2ML IJ SOLN: 25.0000 ug | INTRAMUSCULAR | Status: DC | PRN

## 2020-04-06 MED ORDER — OXYMETAZOLINE HCL 0.05 % NA SOLN
NASAL | Status: DC | PRN
  Administered 2020-04-06: 1 via TOPICAL

## 2020-04-06 MED ORDER — CHLORHEXIDINE GLUCONATE 0.12 % MT SOLN: 15.0000 mL | Freq: Once | OROMUCOSAL | Status: AC

## 2020-04-06 MED ORDER — LIDOCAINE HCL (CARDIAC) PF 100 MG/5ML IV SOSY
PREFILLED_SYRINGE | INTRAVENOUS | Status: DC | PRN
  Administered 2020-04-06: 100 mg via INTRAVENOUS

## 2020-04-06 MED ORDER — SODIUM CHLORIDE 0.9 % IV SOLN: INTRAVENOUS | Status: DC

## 2020-04-06 MED ORDER — FENTANYL CITRATE (PF) 100 MCG/2ML IJ SOLN
INTRAMUSCULAR | Status: AC
  Filled 2020-04-06: qty 2

## 2020-04-06 MED ORDER — TRIAMCINOLONE ACETONIDE 40 MG/ML IJ SUSP
INTRAMUSCULAR | Status: AC
  Filled 2020-04-06: qty 1

## 2020-04-06 MED ORDER — OXYMETAZOLINE HCL 0.05 % NA SOLN
NASAL | Status: AC
  Filled 2020-04-06: qty 30

## 2020-04-06 MED ORDER — SUGAMMADEX SODIUM 200 MG/2ML IV SOLN
INTRAVENOUS | Status: DC | PRN
  Administered 2020-04-06: 200 mg via INTRAVENOUS

## 2020-04-06 MED ORDER — ONDANSETRON HCL 4 MG/2ML IJ SOLN
INTRAMUSCULAR | Status: DC | PRN
  Administered 2020-04-06: 4 mg via INTRAVENOUS

## 2020-04-06 MED ORDER — FAMOTIDINE 20 MG PO TABS: 20.0000 mg | ORAL_TABLET | Freq: Once | ORAL | Status: AC

## 2020-04-06 MED ORDER — CHLORHEXIDINE GLUCONATE 0.12 % MT SOLN
OROMUCOSAL | Status: AC
  Administered 2020-04-06: 15 mL via OROMUCOSAL
  Filled 2020-04-06: qty 15

## 2020-04-06 MED ORDER — ACETAMINOPHEN 325 MG PO TABS: 325.0000 mg | ORAL_TABLET | ORAL | Status: DC | PRN

## 2020-04-06 MED ORDER — PROPOFOL 10 MG/ML IV BOLUS
INTRAVENOUS | Status: DC | PRN
  Administered 2020-04-06: 110 mg via INTRAVENOUS

## 2020-04-06 MED ORDER — ACETAMINOPHEN 160 MG/5ML PO SOLN
325.0000 mg | ORAL | Status: DC | PRN
  Filled 2020-04-06: qty 20.3

## 2020-04-06 MED ORDER — PROPOFOL 10 MG/ML IV BOLUS
INTRAVENOUS | Status: AC
  Filled 2020-04-06: qty 20

## 2020-04-06 MED ORDER — EPHEDRINE SULFATE 50 MG/ML IJ SOLN
INTRAMUSCULAR | Status: DC | PRN
  Administered 2020-04-06 (×2): 10 mg via INTRAVENOUS

## 2020-04-06 MED ORDER — FAMOTIDINE 20 MG PO TABS
ORAL_TABLET | ORAL | Status: AC
  Administered 2020-04-06: 20 mg via ORAL
  Filled 2020-04-06: qty 1

## 2020-04-06 MED ORDER — ONDANSETRON HCL 4 MG/2ML IJ SOLN: 4.0000 mg | Freq: Once | INTRAMUSCULAR | Status: DC | PRN

## 2020-04-06 MED ORDER — ORAL CARE MOUTH RINSE: 15.0000 mL | Freq: Once | OROMUCOSAL | Status: AC

## 2020-04-06 MED ORDER — DEXAMETHASONE SODIUM PHOSPHATE 10 MG/ML IJ SOLN
INTRAMUSCULAR | Status: DC | PRN
  Administered 2020-04-06: 10 mg via INTRAVENOUS

## 2020-04-06 MED ORDER — ROCURONIUM BROMIDE 100 MG/10ML IV SOLN
INTRAVENOUS | Status: DC | PRN
  Administered 2020-04-06: 40 mg via INTRAVENOUS

## 2020-04-06 MED ORDER — MIDAZOLAM HCL 2 MG/2ML IJ SOLN
INTRAMUSCULAR | Status: AC
  Filled 2020-04-06: qty 2

## 2020-04-06 SURGICAL SUPPLY — 20 items
BLOCK BITE GUARD (MISCELLANEOUS) ×2 IMPLANT
COVER BACK TABLE REUSABLE LG (DRAPES) ×2 IMPLANT
COVER MAYO STAND STRL (DRAPES) ×2 IMPLANT
CUP MEDICINE 2OZ PLAST GRAD ST (MISCELLANEOUS) ×2 IMPLANT
DRSG TELFA 4X3 1S NADH ST (GAUZE/BANDAGES/DRESSINGS) ×2 IMPLANT
GLOVE SURG ENC MOIS LTX SZ7.5 (GLOVE) ×2 IMPLANT
GOWN STRL REUS W/ TWL LRG LVL3 (GOWN DISPOSABLE) ×1 IMPLANT
GOWN STRL REUS W/TWL LRG LVL3 (GOWN DISPOSABLE) ×2
KIT TURNOVER KIT A (KITS) ×2 IMPLANT
MANIFOLD NEPTUNE II (INSTRUMENTS) ×2 IMPLANT
MARKER SKIN DUAL TIP RULER LAB (MISCELLANEOUS) ×2 IMPLANT
NEEDLE 18GX1X1/2 (RX/OR ONLY) (NEEDLE) ×2 IMPLANT
NEEDLE HYPO 25GX1X1/2 BEV (NEEDLE) IMPLANT
PATTIES SURGICAL .5 X.5 (GAUZE/BANDAGES/DRESSINGS) ×2 IMPLANT
SOL ANTI-FOG 6CC FOG-OUT (MISCELLANEOUS) ×1 IMPLANT
SOL FOG-OUT ANTI-FOG 6CC (MISCELLANEOUS) ×1
SPONGE XRAY 4X4 16PLY STRL (MISCELLANEOUS) ×2 IMPLANT
SYR 3ML LL SCALE MARK (SYRINGE) IMPLANT
TOWEL OR 17X26 4PK STRL BLUE (TOWEL DISPOSABLE) ×2 IMPLANT
TUBING CONNECTING 10 (TUBING) ×2 IMPLANT

## 2020-04-06 NOTE — Op Note (Signed)
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  At an appropriate level, the table was turned 90 degrees away from Anesthesia.  A clean preparation and draping was performed in the standard fashion. A tooth guard was placed. Using the Dedo laryngoscope, the oropharynx, hypopharynx and larynx were inspected. Visualization was limited due to the patient having limitations of mouth opening, even under anesthesia. The Dedo scope could not be introduced far enough into the larynx to allow him to be placed into suspension, so an anterior commisure scope was utilized. The patient was then placed into suspension with the Lewy arm. The lesion was documented with pictures using a 30 degree scope.   The microscope was used for visualization. Biopsies were taken from the left true vocal cord, debriding the lesion as much as possible. The narrow nature of the anterior commissure scope would only allow for debridement in a piece-meal fashion with cup forceps rather than trying to remove the lesion with microlaryngeal scissors.  Bleeding was controlled with Afrin moistened pledgets.  The findings were as described above.  The laryngoscope was removed.  At this point the procedure was completed.  Dental status was intact.  The patient was returned to Anesthesia, awakened, extubated, and transferred to PACU in satisfactory condition.   Disposition: To PACU, then discharge home  Plan: Soft, bland diet, advance as tolerated. Take pain  medications as prescribed. Follow-up in 3 weeks.  Riley Nearing 04/06/2020 8:24 AM

## 2020-04-06 NOTE — Anesthesia Procedure Notes (Signed)
Procedure Name: Intubation Date/Time: 04/06/2020 8:00 AM Performed by: Allean Found, CRNA Pre-anesthesia Checklist: Patient identified, Patient being monitored, Timeout performed, Emergency Drugs available and Suction available Patient Re-evaluated:Patient Re-evaluated prior to induction Oxygen Delivery Method: Circle system utilized Preoxygenation: Pre-oxygenation with 100% oxygen Induction Type: IV induction Ventilation: Mask ventilation without difficulty Laryngoscope Size: 3, McGraph and 4 Grade View: Grade I Tube type: MLT Tube size: 6.0 mm Number of attempts: 1 Airway Equipment and Method: Stylet Placement Confirmation: ETT inserted through vocal cords under direct vision,  positive ETCO2 and breath sounds checked- equal and bilateral Secured at: 21 cm Tube secured with: Tape Dental Injury: Teeth and Oropharynx as per pre-operative assessment

## 2020-04-06 NOTE — Discharge Instructions (Signed)

## 2020-04-06 NOTE — Transfer of Care (Signed)
Immediate Anesthesia Transfer of Care Note  Patient: Nicholas Douglas  Procedure(s) Performed: MICROLARYNGOSCOPY WITH BIOPSY OF LARYNX (N/A Throat)  Patient Location: PACU  Anesthesia Type:General  Level of Consciousness: awake, alert  and oriented  Airway & Oxygen Therapy: Patient Spontanous Breathing and Patient connected to face mask oxygen  Post-op Assessment: Report given to RN and Post -op Vital signs reviewed and stable  Post vital signs: Reviewed and stable  Last Vitals:  Vitals Value Taken Time  BP 162/79 04/06/20 0840  Temp    Pulse 65 04/06/20 0843  Resp 22 04/06/20 0843  SpO2 100 % 04/06/20 0843  Vitals shown include unvalidated device data.  Last Pain:  Vitals:   04/06/20 0650  TempSrc: Tympanic  PainSc: 0-No pain      Patients Stated Pain Goal: 0 (70/76/15 1834)  Complications: No complications documented.

## 2020-04-06 NOTE — H&P (Signed)
History and physical reviewed and will be scanned in later. No change in medical status reported by the patient or family, appears stable for surgery. All questions regarding the procedure answered, and patient (or family if a child) expressed understanding of the procedure. ? ?Nicholas Douglas ?@TODAY@ ?

## 2020-04-06 NOTE — Anesthesia Preprocedure Evaluation (Addendum)
Anesthesia Evaluation  Patient identified by MRN, date of birth, ID band Patient awake    Reviewed: Allergy & Precautions, H&P , NPO status , reviewed documented beta blocker date and time   Airway Mallampati: III  TM Distance: >3 FB Neck ROM: limited    Dental  (+) Poor Dentition, Chipped, Missing, Partial Lower, Caps   Pulmonary shortness of breath, Current Smoker and Patient abstained from smoking.,  Occas cigars, none x one month   Pulmonary exam normal        Cardiovascular hypertension, + angina + CAD, + Past MI and + Cardiac Stents  Normal cardiovascular exam  12/2018 ECHO IMPRESSIONS    1. Left ventricular ejection fraction, by visual estimation, is 55 to  60%. The left ventricle has normal function. There is no left ventricular  hypertrophy.  2. Left ventricular diastolic parameters are indeterminate.  3. The left ventricle has no regional wall motion abnormalities.  4. Global right ventricle has normal systolic function.The right  ventricular size is normal. No increase in right ventricular wall  thickness.  5. Left atrial size was mildly dilated.  6. Right atrial size was normal.  7. The mitral valve is normal in structure. No evidence of mitral valve  regurgitation. No evidence of mitral stenosis.  8. The tricuspid valve is normal in structure. Tricuspid valve  regurgitation is not demonstrated.  9. The aortic valve is normal in structure. Aortic valve regurgitation is  trivial. Mild aortic valve sclerosis without stenosis.  10. The pulmonic valve was grossly normal. Pulmonic valve regurgitation is  not visualized.  11. The inferior vena cava is normal in size with greater than 50%  respiratory variability, suggesting right atrial pressure of 3 mmHg.  Cardiology clearance noted   Neuro/Psych    GI/Hepatic neg GERD  ,  Endo/Other    Renal/GU Renal disease     Musculoskeletal   Abdominal    Peds  Hematology   Anesthesia Other Findings Past Medical History: No date: BPH (benign prostatic hyperplasia)     Comment:  Followed by [name]/urology 05/31/2015: CAD S/P PTCA only of RPAV-PL     Comment:  99% --> 0%PAV - PTCA only (unable to advance STENT) due               to RCA calcification - prox & distal RCA 30%; Patent LAD               stent & Cx stent (~20% ISR).  No date: Calculus of kidney     Comment:  "that's how they found the cancer" (04/27/2013) 12/03/2011: Coronary artery disease involving native coronary artery  of native heart with unstable angina pectoris ([place])     Comment:  S/P Cardiac angioplasty 1986, 1991, 1993, 1997.  Last               cardiac catheterization 2001.  Cardiolite 05/2011 low risk              with normal EF 65%.  Followed by cardiology/[facility]               [facility] every six months.  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 No date: Myocardial infarction ([place]) No date: Other abnormal blood chemistry No date: Other nonspecific abnormal serum enzyme levels No date: Personal history of colonic polyps No date: Pure hypercholesterolemia No date: Tobacco use disorder No date: Unspecified congenital cystic kidney disease 04/05/2013; 05/2015: Unstable angina [facility]) Past Surgical History: 2014: APPENDECTOMY 05/30/2015: CARDIAC CATHETERIZATION; N/A     Comment:  Procedure: Left Heart Cath and Coronary Angiography;                Surgeon: [name], MD;  Location: [facility]               LAB;  Service: Cardiovascular;  Laterality: N/A; 05/30/2015: CARDIAC CATHETERIZATION; N/A     Comment:   Procedure: Coronary Balloon Angioplasty;  Surgeon:               [name], MD;  Location: [facility] LAB;                Service: Cardiovascular;  Laterality: N/A; 05/06/2011: Cardiolite     Comment:  low risk study; normal EF.  SE H&V. 03/08/2011: carotid dopplers     Comment:  minimal plaque formation B. Symptoms: dizziness. 10/05/2008: COLONOSCOPY     Comment:  single polyp, IH.  [name].  Repeat in 3 years. No date: CORONARY ANGIOPLASTY     Comment:  "I've had 4" (04/27/2013) 04/2013; 04/27/2013: CORONARY ANGIOPLASTY WITH STENT PLACEMENT     Comment:  "2 + 1" (04/27/2013) 11/04/2013: EYE SURGERY     Comment:  Cataract surgery B; [place]. 08/05/2016: LEFT HEART CATH AND CORONARY ANGIOGRAPHY; N/A     Comment:  Procedure: Left Heart Cath and Coronary Angiography;                Surgeon: Martinique, Peter M, MD;  Location: [facility]               LAB;  Service: Cardiovascular;  Laterality: N/A; 01/04/2019: LEFT HEART CATH AND CORONARY ANGIOGRAPHY; N/A     Comment:  Procedure: LEFT HEART CATH AND CORONARY ANGIOGRAPHY;                Surgeon: [name], MD;  Location: [facility]               LAB;  Service: Cardiovascular;  Laterality: N/A; ~ 2007: MELANOMA EXCISION; Left     Comment:  "arm" 02/07/2015: MELANOMA EXCISION     Comment:  R upper back. [facility] 2006: NEPHRECTOMY; Left 04/06/2013: PERCUTANEOUS CORONARY ROTOBLATOR INTERVENTION (PCI-R); N/A     Comment:  Procedure: PERCUTANEOUS CORONARY ROTOBLATOR INTERVENTION              (PCI-R);  Surgeon: [name], MD;  Location: [facility] CATH              LAB;  Service: Cardiovascular;  Laterality: N/A; 04/27/2013: PERCUTANEOUS CORONARY STENT INTERVENTION (PCI-S); N/A     Comment:  Procedure: PERCUTANEOUS CORONARY STENT INTERVENTION               (PCI-S);  Surgeon: [name], MD;  Location: [facility] CATH              LAB;  Service: Cardiovascular;  Laterality: N/A;   Reproductive/Obstetrics                             Anesthesia Physical Anesthesia Plan  ASA:  III  Anesthesia Plan: General   Post-op Pain Management:    Induction: Intravenous  PONV Risk Score and Plan: Ondansetron and Treatment may vary due to age or medical condition  Airway Management Planned: Oral ETT  Additional Equipment:   Intra-op Plan:   Post-operative Plan: Extubation in OR  Informed Consent: I have reviewed the patients History and Physical, chart, labs and discussed the procedure including the risks, benefits and alternatives for the proposed anesthesia with the patient or authorized representative who has indicated his/her understanding and acceptance.     Dental Advisory Given  Plan Discussed with: CRNA  Anesthesia Plan Comments:         Anesthesia Quick Evaluation

## 2020-04-06 NOTE — Anesthesia Postprocedure Evaluation (Signed)
Anesthesia Post Note  Patient: Nicholas Douglas  Procedure(s) Performed: MICROLARYNGOSCOPY WITH BIOPSY OF LARYNX (N/A Throat)  Patient location during evaluation: PACU Anesthesia Type: General Level of consciousness: awake and alert Pain management: pain level controlled Vital Signs Assessment: post-procedure vital signs reviewed and stable Respiratory status: spontaneous breathing, nonlabored ventilation and respiratory function stable Cardiovascular status: blood pressure returned to baseline and stable Postop Assessment: no apparent nausea or vomiting Anesthetic complications: no   No complications documented.   Last Vitals:  Vitals:   04/06/20 0910 04/06/20 0932  BP: 116/82 (!) 158/76  Pulse: (!) 54 (!) 53  Resp: 16 16  Temp: (!) 36.1 C (!) 36.1 C  SpO2: 94% 95%    Last Pain:  Vitals:   04/06/20 0932  TempSrc: Temporal  PainSc:                  Alphonsus Sias

## 2020-04-07 ENCOUNTER — Other Ambulatory Visit: Payer: Self-pay | Admitting: Pathology

## 2020-04-07 LAB — SURGICAL PATHOLOGY

## 2020-04-11 ENCOUNTER — Other Ambulatory Visit: Payer: Self-pay

## 2020-04-11 DIAGNOSIS — J984 Other disorders of lung: Secondary | ICD-10-CM

## 2020-04-11 NOTE — Progress Notes (Signed)
Daily Session Note  Patient Details  Name: Nicholas Douglas MRN: [id number] Date of Birth: October 10, 1931 Referring Provider:   Flowsheet Row Pulmonary Rehab from 01/04/2020 in [facility] Cardiac and Pulmonary Rehab  Referring Provider [name] MD      Encounter Date: 04/11/2020  Check In:  Session Check In - 04/11/20 0715      Check-In   Supervising physician immediately available to respond to emergencies See telemetry face sheet for immediately available ER MD    Location [facility]-Cardiac & Pulmonary Rehab    Staff Present Birdie Sons, MPA, RN;Amanda Oletta Darter, BA, ACSM CEP, Exercise Physiologist;Kara Eliezer Bottom, MS Exercise Physiologist    Virtual Visit No    Medication changes reported     No    Fall or balance concerns reported    No    Tobacco Cessation No Change    Current number of cigarettes/nicotine per day     0    Warm-up and Cool-down Performed on first and last piece of equipment    Resistance Training Performed Yes    VAD Patient? No    PAD/SET Patient? No      Pain Assessment   Currently in Pain? No/denies              Social History   Tobacco Use  Smoking Status Current Some Day Smoker  . Packs/day: 0.25  . Years: 20.00  . Pack years: 5.00  . Types: Cigars, Pipe  Smokeless Tobacco Former Systems developer  . Types: Chew  Tobacco Comment   Smokes about 2 cigars two months     Goals Met:  Independence with exercise equipment Exercise tolerated well No report of cardiac concerns or symptoms Strength training completed today  Goals Unmet:  Not Applicable  Comments: Pt able to follow exercise prescription today without complaint.  Will continue to monitor for progression.    Dr. Emily Filbert is Medical Director for [facility] and [facility].

## 2020-04-13 ENCOUNTER — Other Ambulatory Visit: Payer: Self-pay

## 2020-04-13 ENCOUNTER — Other Ambulatory Visit: Payer: Medicare Other

## 2020-04-13 DIAGNOSIS — J984 Other disorders of lung: Secondary | ICD-10-CM | POA: Diagnosis not present

## 2020-04-13 NOTE — Progress Notes (Signed)
Daily Session Note  Patient Details  Name: Nicholas Douglas MRN: [id number] Date of Birth: 1931/07/03 Referring Provider:   Flowsheet Row Pulmonary Rehab from 01/04/2020 in [facility] Cardiac and Pulmonary Rehab  Referring Provider [name] MD      Encounter Date: 04/13/2020  Check In:  Session Check In - 04/13/20 0731      Check-In   Supervising physician immediately available to respond to emergencies See telemetry face sheet for immediately available ER MD    Location [facility]-Cardiac & Pulmonary Rehab    Staff Present Birdie Sons, MPA, Mauricia Area, BS, ACSM CEP, Exercise Physiologist;Amanda Oletta Darter, BA, ACSM CEP, Exercise Physiologist    Virtual Visit No    Medication changes reported     No    Fall or balance concerns reported    No    Tobacco Cessation No Change    Current number of cigarettes/nicotine per day     0    Warm-up and Cool-down Performed on first and last piece of equipment    Resistance Training Performed Yes    VAD Patient? No    PAD/SET Patient? No      Pain Assessment   Currently in Pain? No/denies              Social History   Tobacco Use  Smoking Status Current Some Day Smoker  . Packs/day: 0.25  . Years: 20.00  . Pack years: 5.00  . Types: Cigars, Pipe  Smokeless Tobacco Former Systems developer  . Types: Chew  Tobacco Comment   Smokes about 2 cigars two months     Goals Met:  Independence with exercise equipment Exercise tolerated well No report of cardiac concerns or symptoms Strength training completed today  Goals Unmet:  Not Applicable  Comments: Pt able to follow exercise prescription today without complaint.  Will continue to monitor for progression.    Dr. Emily Filbert is Medical Director for [facility] and [facility].

## 2020-04-14 ENCOUNTER — Telehealth: Payer: Self-pay | Admitting: Oncology

## 2020-04-14 ENCOUNTER — Inpatient Hospital Stay: Payer: Medicare Other | Attending: Oncology | Admitting: Oncology

## 2020-04-14 ENCOUNTER — Encounter: Payer: Self-pay | Admitting: Oncology

## 2020-04-14 ENCOUNTER — Other Ambulatory Visit: Payer: Self-pay

## 2020-04-14 ENCOUNTER — Inpatient Hospital Stay: Payer: Medicare Other

## 2020-04-14 VITALS — BP 166/70 | HR 62 | Temp 97.6°F | Resp 18 | Wt 194.0 lb

## 2020-04-14 DIAGNOSIS — Z85820 Personal history of malignant melanoma of skin: Secondary | ICD-10-CM | POA: Insufficient documentation

## 2020-04-14 DIAGNOSIS — Z905 Acquired absence of kidney: Secondary | ICD-10-CM | POA: Diagnosis not present

## 2020-04-14 DIAGNOSIS — Z7189 Other specified counseling: Secondary | ICD-10-CM

## 2020-04-14 DIAGNOSIS — I10 Essential (primary) hypertension: Secondary | ICD-10-CM | POA: Insufficient documentation

## 2020-04-14 DIAGNOSIS — Z85528 Personal history of other malignant neoplasm of kidney: Secondary | ICD-10-CM | POA: Diagnosis not present

## 2020-04-14 DIAGNOSIS — Z79899 Other long term (current) drug therapy: Secondary | ICD-10-CM | POA: Insufficient documentation

## 2020-04-14 DIAGNOSIS — C329 Malignant neoplasm of larynx, unspecified: Secondary | ICD-10-CM

## 2020-04-14 DIAGNOSIS — N400 Enlarged prostate without lower urinary tract symptoms: Secondary | ICD-10-CM | POA: Diagnosis not present

## 2020-04-14 DIAGNOSIS — Z8249 Family history of ischemic heart disease and other diseases of the circulatory system: Secondary | ICD-10-CM | POA: Insufficient documentation

## 2020-04-14 DIAGNOSIS — Z87891 Personal history of nicotine dependence: Secondary | ICD-10-CM | POA: Diagnosis not present

## 2020-04-14 DIAGNOSIS — Z7982 Long term (current) use of aspirin: Secondary | ICD-10-CM | POA: Insufficient documentation

## 2020-04-14 DIAGNOSIS — I252 Old myocardial infarction: Secondary | ICD-10-CM | POA: Insufficient documentation

## 2020-04-14 DIAGNOSIS — D491 Neoplasm of unspecified behavior of respiratory system: Secondary | ICD-10-CM

## 2020-04-14 MED ORDER — LIVALO 4 MG PO TABS: 4.0000 mg | ORAL_TABLET | Freq: Every evening | ORAL | 3 refills | Status: DC

## 2020-04-14 NOTE — Progress Notes (Signed)
Tumor Board Documentation  Nicholas Douglas was presented by Dr Richardson Landry at our Tumor Board on 04/13/2020, which included representatives from medical oncology,radiation oncology,internal medicine,navigation,pathology,radiology,surgical,pharmacy,research,palliative care.  Nicholas Douglas currently presents as an Sports coach MDC,for new positive pathology with history of the following treatments: surgical intervention(s).  Additionally, we reviewed previous medical and familial history, history of present illness, and recent lab results along with all available histopathologic and imaging studies. The tumor board considered available treatment options and made the following recommendations: Palliative radiation therapy,Additional screening Referral to Med Oncology  The following procedures/referrals were also placed: No orders of the defined types were placed in this encounter.   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  Tumor board is a meeting of clinicians from various specialty areas who evaluate and discuss patients for whom a multidisciplinary approach is being considered. Final determinations in the plan of care are those of the provider(s). The responsibility for follow up of recommendations given during tumor board is that of the provider.   Today's extended care, comprehensive team conference, Nicholas Douglas was not present for the discussion and was not examined.   Multidisciplinary Tumor Board is a multidisciplinary case peer review process.  Decisions discussed in the Multidisciplinary Tumor Board reflect the opinions of the specialists present at the conference without having examined the patient.  Ultimately, treatment and diagnostic decisions rest with the primary provider(s) and the  patient.

## 2020-04-14 NOTE — Telephone Encounter (Signed)
Spoke with pt's wife Mariann Laster) about PET scan scheduled for 3/15 at [facility] in [place]. She confirmed the day/time and location.

## 2020-04-16 DIAGNOSIS — Z7189 Other specified counseling: Secondary | ICD-10-CM | POA: Insufficient documentation

## 2020-04-16 DIAGNOSIS — C329 Malignant neoplasm of larynx, unspecified: Secondary | ICD-10-CM | POA: Insufficient documentation

## 2020-04-16 NOTE — Progress Notes (Signed)
Hematology/Oncology Consult note [facility] [phone] [phone]  Patient Care Team: Rusty Aus, MD as PCP - General (Internal Medicine) [name], MD as PCP - Cardiology (Cardiology) Lorelee Cover., MD (Ophthalmology) Barrie Dunker, MD (Dermatology) Emmaline Kluver., MD (Gastroenterology)   Name of the patient: Nicholas Douglas  [id number]  06/14/1931    Reason for referral- [facility] larynx   Referring physician- DR. Richardson Landry  Date of visit: 04/16/20   History of presenting illness-patient is a 87 year old male who presented to Dr. Richardson Landry with symptoms of bilateral hearing loss and concerns for hoarseness of voice.  He is a current smoker.  Auto laryngoscopy exam revealed an erythematous almost papillary lesion involving the left true vocal cord.  Right true vocal cord was clear.  Hypopharynx was also clear.  This mass was biopsied and was consistent with squamous cell carcinoma.  Patient referred for further management.  He is currently doing well for his age and is here with his wife today.  He has been asked to rest his voice for the next week or so.  ECOG PS- 1  Pain scale- 2   Review of systems- Review of Systems  Constitutional: Negative for chills, fever, malaise/fatigue and weight loss.  HENT: Negative for congestion, ear discharge and nosebleeds.        Hoarseness of voice  Eyes: Negative for blurred vision.  Respiratory: Negative for cough, hemoptysis, sputum production, shortness of breath and wheezing.   Cardiovascular: Negative for chest pain, palpitations, orthopnea and claudication.  Gastrointestinal: Negative for abdominal pain, blood in stool, constipation, diarrhea, heartburn, melena, nausea and vomiting.  Genitourinary: Negative for dysuria, flank pain, frequency, hematuria and urgency.  Musculoskeletal: Negative for back pain, joint pain and myalgias.  Skin: Negative for rash.  Neurological:  Negative for dizziness, tingling, focal weakness, seizures, weakness and headaches.  Endo/Heme/Allergies: Does not bruise/bleed easily.  Psychiatric/Behavioral: Negative for depression and suicidal ideas. The patient does not have insomnia.     Allergies  Allergen Reactions  . Angiotensin Receptor Blockers     Other reaction(s): Kidney Disorder Hyperkalemia  . Lovastatin Other (See Comments)    Elevated liver enzymes  . Morphine Hives  . Nifedipine Other (See Comments)    Elevated liver enzymes    Patient Active Problem List   Diagnosis Date Noted  . 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 Goals of care, counseling/discussion 04/16/2020  . CAD S/P PTCA only of RPAV-PL 05/31/2015  . Stented coronary artery   . Chronic kidney disease, stage III (moderate) ([place]) 05/29/2015  . Solitary kidney, acquired 05/29/2015  . AAA (abdominal aortic aneurysm) ([place])   . Exertional dyspnea 05/11/2015  . Melanoma in situ of back ([place]) 04/20/2015  . Abdominal aortic aneurysm (AAA) ([place]) 01/12/2015  . Bronchitis 05/14/2013  . S/P percutaneous angioplasty of renal artery 04/27/2013  . Angina pectoris ([place]) 04/05/2013  . S/P appendectomy 11/20/2012  . CAD in native artery 08/21/2012  . Need for influenza vaccination 12/03/2011  . Routine general medical examination at a health care facility 12/03/2011  . Essential hypertension, benign 12/03/2011  . Coronary artery disease involving native coronary artery of native heart with angina pectoris ([place]) 12/03/2011  . Renal cell carcinoma ([place]) 12/03/2011  . Hyperlipidemia 12/03/2011  . Colon polyps 12/03/2011  . Dizziness 12/03/2011  . Melanoma in situ of upper arm ([place]) 12/03/2011     Past Medical History:  Diagnosis Date  . BPH (benign prostatic hyperplasia)  Followed by [name]/urology  . CAD S/P PTCA only of RPAV-PL 05/31/2015   99% --> 0%PAV - PTCA only (unable to advance STENT) due to RCA calcification - prox & distal RCA  30%; Patent LAD stent & Cx stent (~20% ISR).   . Calculus of kidney    "that's how they found the cancer" (04/27/2013)  . Coronary artery disease involving native coronary artery of native heart with unstable angina pectoris ([place]) 12/03/2011   S/P Cardiac angioplasty 1986, 1991, 1993, 1997.  Last cardiac catheterization 2001.  Cardiolite 05/2011 low risk with normal EF 65%.  Followed by cardiology/[name] every six months.   . Dyspnea    only on exertion  . History of myocardial infarction 1976  . Hypertension   . Impotence of organic origin   . Internal hemorrhoids without mention of complication   . Malignant neoplasm of kidney, except pelvis   . Melanoma ([place]) 06/2010   Left arm  . Melanoma of back ([place]) 02/07/2015   R upper back; excision [facility].  . Myocardial infarction ([place])   . Other abnormal blood chemistry   . Other nonspecific abnormal serum enzyme levels   . Personal history of colonic polyps   . Pure hypercholesterolemia   . Tobacco use disorder   . Unspecified congenital cystic kidney disease   . Unstable angina ([place]) 04/05/2013; 05/2015     Past Surgical History:  Procedure Laterality Date  . APPENDECTOMY  2014  . CARDIAC CATHETERIZATION N/A 05/30/2015   Procedure: Left Heart Cath and Coronary Angiography;  Surgeon: [name], MD;  Location: [place] [facility];  Service: Cardiovascular;  Laterality: N/A;  . CARDIAC CATHETERIZATION N/A 05/30/2015   Procedure: Coronary Balloon Angioplasty;  Surgeon: [name], MD;  Location: [place] [facility];  Service: Cardiovascular;  Laterality: N/A;  . Cardiolite  05/06/2011   low risk study; normal EF.  [facility]  . carotid dopplers  03/08/2011   minimal plaque formation B. Symptoms: dizziness.  . COLONOSCOPY  10/05/2008   single polyp, IH.  [name].  Repeat in 3 years.  . CORONARY ANGIOPLASTY     "I've had 4" (04/27/2013)  . CORONARY ANGIOPLASTY WITH STENT PLACEMENT  04/2013; 04/27/2013   "2 + 1" (04/27/2013)  . EYE SURGERY   11/04/2013   Cataract surgery B; [place].  Marland Kitchen LEFT HEART CATH AND CORONARY ANGIOGRAPHY N/A 08/05/2016   Procedure: Left Heart Cath and Coronary Angiography;  Surgeon: Martinique, Peter M, MD;  Location: [place] [facility];  Service: Cardiovascular;  Laterality: N/A;  . LEFT HEART CATH AND CORONARY ANGIOGRAPHY N/A 01/04/2019   Procedure: LEFT HEART CATH AND CORONARY ANGIOGRAPHY;  Surgeon: [name], MD;  Location: [place] [facility];  Service: Cardiovascular;  Laterality: N/A;  . MELANOMA EXCISION Left ~ 2007   "arm"  . MELANOMA EXCISION  02/07/2015   R upper back. [facility]  . MICROLARYNGOSCOPY N/A 04/06/2020   Procedure: MICROLARYNGOSCOPY WITH BIOPSY OF LARYNX;  Surgeon: Clyde Canterbury, MD;  Location: [facility] ORS;  Service: ENT;  Laterality: N/A;  . NEPHRECTOMY Left 2006  . PERCUTANEOUS CORONARY ROTOBLATOR INTERVENTION (PCI-R) N/A 04/06/2013   Procedure: PERCUTANEOUS CORONARY ROTOBLATOR INTERVENTION (PCI-R);  Surgeon: [name], MD;  Location: [facility] CATH LAB;  Service: Cardiovascular;  Laterality: N/A;  . PERCUTANEOUS CORONARY STENT INTERVENTION (PCI-S) N/A 04/27/2013   Procedure: PERCUTANEOUS CORONARY STENT INTERVENTION (PCI-S);  Surgeon: [name], MD;  Location: [facility] CATH LAB;  Service: Cardiovascular;  Laterality: N/A;  Social History   Socioeconomic History  . Marital status: Married    Spouse name: Not on file  . Number of children: 3  . Years of education: college  . Highest education level: Not on file  Occupational History  . Occupation: Ecologist: RETIRED    Comment: Personal assistant, [employer]  Tobacco Use  . Smoking status: Former Smoker    Packs/day: 0.25    Years: 20.00    Pack years: 5.00    Types: Cigars, Pipe  . Smokeless tobacco: Former Systems developer    Types: Chew  . Tobacco comment: Smokes about 2 cigars two months   Vaping Use  . Vaping Use: Never used  Substance and Sexual Activity  . Alcohol use: Yes    Alcohol/week: 1.0 standard drink    Types: 1  Glasses of wine per week    Comment: occasionally   . Drug use: No  . Sexual activity: Yes  Other Topics Concern  . Not on file  Social History Narrative   Marital status:  Married x 30 years; 2nd marriage      Children:  3 children, 2 step-children and 10 grandchildren.        Lives:  Lives with wife.         Employment:  Retired; Environmental education officer      Tobacco:  Former user; chews tobacco.      Alcohol:  5 glasses of wine per week       Drugs:  No drugs.        Exercise: 5 x week Light; plays golf 5 days per week but rides golf cart, walking 1 mile.         Advanced Directives:  Patient DOES NOT have living will; desires DNR/DNI.       ADLS: independent with all ADLs; drives.  No falls.  Does not use assistant devices with ambulation.     Social Determinants of Health   Financial Resource Strain: Not on file  Food Insecurity: Not on file  Transportation Needs: Not on file  Physical Activity: Not on file  Stress: Not on file  Social Connections: Not on file  Intimate Partner Violence: Not on file     Family History  Problem Relation Age of Onset  . Heart disease Mother        CAD, AAA.  Marland Kitchen AAA (abdominal aortic aneurysm) Mother      Current Outpatient Medications:  .  acetaminophen (TYLENOL) 500 MG tablet, Take 500 mg by mouth every 6 (six) hours as needed (for pain.). , Disp: , Rfl:  .  amLODipine (NORVASC) 10 MG tablet, Take 1 tablet (10 mg total) by mouth every evening., Disp: 90 tablet, Rfl: 3 .  aspirin 81 MG tablet, Take 81 mg by mouth every evening., Disp: , Rfl:  .  atenolol (TENORMIN) 25 MG tablet, TAKE ONE-HALF (1/2) TABLET  BY MOUTH TWICE A DAY (Patient taking differently: Take 12.5 mg by mouth 2 (two) times daily.), Disp: 90 tablet, Rfl: 3 .  Carboxymethylcell-Hypromellose (GENTEAL OP), Apply 1 drop to eye 2 (two) times daily., Disp: , Rfl:  .  clopidogrel (PLAVIX) 75 MG tablet, Take 1 tablet (75 mg total) by mouth daily., Disp: 90 tablet, Rfl: 3 .  ezetimibe (ZETIA) 10  MG tablet, TAKE 1 TABLET BY MOUTH  DAILY (Patient taking differently: Take 10 mg by mouth daily.), Disp: 90 tablet, Rfl: 3 .  finasteride (PROSCAR) 5 MG tablet, Take 1 tablet (  5 mg total) by mouth daily. (Patient taking differently: Take 2.5 mg by mouth daily.), Disp: 90 tablet, Rfl: 3 .  isosorbide mononitrate (IMDUR) 60 MG 24 hr tablet, TAKE 1 TABLET BY MOUTH  DAILY (Patient taking differently: Take 60 mg by mouth daily.), Disp: 90 tablet, Rfl: 3 .  Pitavastatin Calcium (LIVALO) 4 MG TABS, Take 1 tablet (4 mg total) by mouth every evening., Disp: 90 tablet, Rfl: 3 .  spironolactone (ALDACTONE) 25 MG tablet, Take 0.5 tablets (12.5 mg total) by mouth daily., Disp: 45 tablet, Rfl: 3 .  traZODone (DESYREL) 50 MG tablet, Take 50 mg by mouth at bedtime as needed for sleep., Disp: , Rfl:  .  VASCEPA 1 g capsule, TAKE 1 CAPSULE BY MOUTH  TWICE DAILY (Patient taking differently: Take 1 g by mouth 2 (two) times daily.), Disp: 180 capsule, Rfl: 3 .  nitroGLYCERIN (NITROSTAT) 0.4 MG SL tablet, PLACE 1 TABLET (0.4 MG TOTAL) UNDER THE TONGUE EVERY 5 (FIVE) MINUTES AS NEEDED FOR CHEST PAIN. (Patient not taking: Reported on 04/14/2020), Disp: 25 tablet, Rfl: 11   Physical exam:  Vitals:   04/14/20 1510  BP: (!) 166/70  Pulse: 62  Resp: 18  Temp: 97.6 F (36.4 C)  TempSrc: Tympanic  SpO2: 96%  Weight: 194 lb (88 kg)   Physical Exam Constitutional:      General: He is not in acute distress. HENT:     Mouth/Throat:     Mouth: Mucous membranes are moist.     Pharynx: Oropharynx is clear.  Eyes:     Pupils: Pupils are equal, round, and reactive to light.  Cardiovascular:     Rate and Rhythm: Normal rate and regular rhythm.     Heart sounds: Normal heart sounds.  Pulmonary:     Effort: Pulmonary effort is normal.     Breath sounds: Normal breath sounds.  Abdominal:     General: Bowel sounds are normal.     Palpations: Abdomen is soft.  Lymphadenopathy:     Comments: No palpable cervical  adenopathy  Skin:    General: Skin is warm and dry.  Neurological:     Mental Status: He is alert and oriented to person, place, and time.        CMP Latest Ref Rng & Units 11/23/2019  Glucose 65 - 99 mg/dL 95  BUN 8 - 27 mg/dL 18  Creatinine 0.76 - 1.27 mg/dL 1.29(H)  Sodium 134 - 144 mmol/L 141  Potassium 3.5 - 5.2 mmol/L 4.6  Chloride 96 - 106 mmol/L 103  CO2 20 - 29 mmol/L 22  Calcium 8.6 - 10.2 mg/dL 9.4  Total Protein 6.0 - 8.5 g/dL 7.1  Total Bilirubin 0.0 - 1.2 mg/dL 0.7  Alkaline Phos 44 - 121 IU/L 65  AST 0 - 40 IU/L 46(H)  ALT 0 - 44 IU/L 43   CBC Latest Ref Rng & Units 11/23/2019  WBC 3.4 - 10.8 x10E3/uL 6.2  Hemoglobin 13.0 - 17.7 g/dL 16.4  Hematocrit 37.5 - 51.0 % 51.0  Platelets 150 - 450 x10E3/uL 263    No images are attached to the encounter.  No results found.  Assessment and plan- Patient is a 87 y.o. 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  Right true vocal cord was clear.  No palpable cervical adenopathy.  We are likely dealing  with T1 a disease which can be treated with local radiation without chemotherapy.  Surgery can also be potentially avoided in this case.  I will proceed with a PET CT scan at this time to complete his staging work-up and see him back along with radiation oncology to discuss his final staging and management.  His case was also discussed at tumor board.   Thank you for this kind referral and the opportunity to participate in the care of this  Patient   Visit Diagnosis 1. Squamous cell carcinoma of larynx (HCC)   2. Goals of care, counseling/discussion     Dr. Randa Evens, MD, MPH [facility] [phone] 04/16/2020

## 2020-04-18 ENCOUNTER — Ambulatory Visit (HOSPITAL_COMMUNITY)
Admission: RE | Admit: 2020-04-18 | Discharge: 2020-04-18 | Disposition: A | Discharge status: Home / Self Care | Payer: Medicare Other | Source: Ambulatory Visit | Attending: Oncology | Admitting: Oncology

## 2020-04-18 ENCOUNTER — Other Ambulatory Visit: Payer: Self-pay

## 2020-04-18 DIAGNOSIS — C329 Malignant neoplasm of larynx, unspecified: Secondary | ICD-10-CM | POA: Insufficient documentation

## 2020-04-18 LAB — GLUCOSE, CAPILLARY: Glucose-Capillary: 104 mg/dL — ABNORMAL HIGH (ref 70–99)

## 2020-04-18 MED ORDER — FLUDEOXYGLUCOSE F - 18 (FDG) INJECTION
9.5000 | Freq: Once | INTRAVENOUS | Status: AC
  Administered 2020-04-18: 9.5 via INTRAVENOUS

## 2020-04-20 ENCOUNTER — Other Ambulatory Visit: Payer: Self-pay

## 2020-04-20 DIAGNOSIS — J984 Other disorders of lung: Secondary | ICD-10-CM | POA: Diagnosis not present

## 2020-04-20 NOTE — Progress Notes (Signed)
Daily Session Note  Patient Details  Name: Nicholas Douglas MRN: [id number] Date of Birth: [birth year] Referring Provider:   Flowsheet Row Pulmonary Rehab from 01/04/2020 in [facility] Cardiac [facility]  Referring Provider [name] MD      Encounter Date: 04/20/2020  Check In:  Session Check In - 04/20/20 0722      Check-In   Supervising physician immediately available to respond to emergencies See telemetry face sheet for immediately available ER MD    Location [facility]-Cardiac & Pulmonary Rehab    Staff Present Birdie Sons, MPA, Mauricia Area, BS, ACSM CEP, Exercise Physiologist;Amanda Oletta Darter, BA, ACSM CEP, Exercise Physiologist    Virtual Visit No    Medication changes reported     No    Fall or balance concerns reported    No    Tobacco Cessation No Change    Warm-up and Cool-down Performed on first and last piece of equipment    Resistance Training Performed Yes    VAD Patient? No    PAD/SET Patient? No      Pain Assessment   Currently in Pain? No/denies              Social History   Tobacco Use  Smoking Status Former Smoker  . Packs/day: 0.25  . Years: 20.00  . Pack years: 5.00  . Types: Cigars, Pipe  Smokeless Tobacco Former Systems developer  . Types: Chew  Tobacco Comment   Smokes about 2 cigars two months     Goals Met:  Independence with exercise equipment Exercise tolerated well No report of cardiac concerns or symptoms Strength training completed today  Goals Unmet:  Not Applicable  Comments: Pt able to follow exercise prescription today without complaint.  Will continue to monitor for progression.    Dr. Emily Filbert is Medical Director for [facility].

## 2020-04-21 ENCOUNTER — Inpatient Hospital Stay (HOSPITAL_BASED_OUTPATIENT_CLINIC_OR_DEPARTMENT_OTHER): Payer: Medicare Other | Admitting: Oncology

## 2020-04-21 DIAGNOSIS — Z7189 Other specified counseling: Secondary | ICD-10-CM | POA: Diagnosis not present

## 2020-04-21 DIAGNOSIS — C329 Malignant neoplasm of larynx, unspecified: Secondary | ICD-10-CM | POA: Diagnosis not present

## 2020-04-24 ENCOUNTER — Other Ambulatory Visit: Payer: Self-pay

## 2020-04-24 ENCOUNTER — Encounter: Payer: Self-pay | Admitting: Radiation Oncology

## 2020-04-24 ENCOUNTER — Ambulatory Visit
Admission: RE | Admit: 2020-04-24 | Discharge: 2020-04-24 | Disposition: A | Discharge status: Home / Self Care | Payer: Medicare Other | Source: Ambulatory Visit | Attending: Radiation Oncology | Admitting: Radiation Oncology

## 2020-04-24 VITALS — BP 160/75 | HR 65 | Temp 97.0°F | Wt 188.0 lb

## 2020-04-24 DIAGNOSIS — C329 Malignant neoplasm of larynx, unspecified: Secondary | ICD-10-CM

## 2020-04-24 NOTE — Consult Note (Signed)
NEW PATIENT EVALUATION  Name: Nicholas Douglas  MRN: [id number]  Date:   04/24/2020     DOB: [birth year]   This 87 y.o. 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  REFERRING PHYSICIAN: Rusty Aus, MD  CHIEF COMPLAINT:  Chief Complaint  Patient presents with  . Consult    DIAGNOSIS: The encounter diagnosis was Squamous cell carcinoma of larynx ([name]).   PREVIOUS INVESTIGATIONS:  PET CT scan reviewed Pathology report reviewed Clinical notes reviewed  HPI: Patient is a pleasant 87 year old male who presented with bilateral hearing loss as well as increasing hoarseness.  He was seen by Dr. Richardson Landry laryngoscopy showed A papular lesion involving the left true cord with bilateral cord mobility intact.  Right true cord was clear.  Hypopharynx is also clear.  Biopsy was positive for invasive keratinizing well-differentiated squamous cell carcinoma.  High-grade dysplasia was also present.  PET CT scan was performed showing no hypermetabolic laryngeal mass to suggest residual tumor no neck adenopathy or other findings concerning for metastatic metastatic disease were noted.  He has some hoarseness to his voice no dysphagia.  He is accompanied by his daughter today is now seen for radiation oncology opinion.  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    PAST SURGICAL HISTORY:  Past Surgical History:  Procedure Laterality Date  . APPENDECTOMY  2014  . CARDIAC CATHETERIZATION N/A 05/30/2015   Procedure: Left Heart Cath and Coronary Angiography;  Surgeon: [name], MD;  Location: [place] [facility];  Service: Cardiovascular;  Laterality: N/A;  . CARDIAC CATHETERIZATION N/A 05/30/2015   Procedure: Coronary Balloon Angioplasty;  Surgeon: [name], MD;  Location: [place];  Service: Cardiovascular;  Laterality: N/A;  . Cardiolite  05/06/2011   low risk study; normal EF.  SE H&V.  . carotid dopplers  03/08/2011   minimal plaque formation B. Symptoms: dizziness.  . COLONOSCOPY  10/05/2008   single polyp, IH.  [name].  Repeat in 3 years.  . CORONARY ANGIOPLASTY     "I've had 4" (04/27/2013)  . CORONARY ANGIOPLASTY WITH STENT PLACEMENT  04/2013; 04/27/2013   "2 + 1" (04/27/2013)  . EYE SURGERY  11/04/2013   Cataract surgery B; [place].  Marland Kitchen LEFT HEART CATH AND CORONARY ANGIOGRAPHY N/A 08/05/2016   Procedure: Left Heart Cath and Coronary Angiography;  Surgeon: Martinique, Peter M, MD;  Location: [facility] [facility];  Service: Cardiovascular;  Laterality: N/A;  . LEFT HEART CATH AND CORONARY ANGIOGRAPHY N/A 01/04/2019   Procedure: LEFT HEART CATH AND CORONARY ANGIOGRAPHY;  Surgeon: [name], MD;  Location: [facility];  Service: Cardiovascular;  Laterality: N/A;  . MELANOMA EXCISION Left ~ 2007   "arm"  . MELANOMA EXCISION  02/07/2015   R upper back. [facility]  .  MICROLARYNGOSCOPY N/A 04/06/2020   Procedure: MICROLARYNGOSCOPY WITH BIOPSY OF LARYNX;  Surgeon: Clyde Canterbury, MD;  Location: [facility];  Service: ENT;  Laterality: N/A;  . NEPHRECTOMY Left 2006  . PERCUTANEOUS CORONARY ROTOBLATOR  INTERVENTION (PCI-R) N/A 04/06/2013   Procedure: PERCUTANEOUS CORONARY ROTOBLATOR INTERVENTION (PCI-R);  Surgeon: [name], MD;  Location: [facility] C[facility];  Service: Cardiovascular;  Laterality: N/A;  . PERCUTANEOUS CORONARY STENT INTERVENTION (PCI-S) N/A 04/27/2013   Procedure: PERCUTANEOUS CORONARY STENT INTERVENTION (PCI-S);  Surgeon: [name], MD;  Location: [facility] CATH LAB;  Service: Cardiovascular;  Laterality: N/A;    FAMILY HISTORY: family history includes AAA (abdominal aortic aneurysm) in his mother; Heart disease in his mother.  SOCIAL HISTORY:  reports that he has quit smoking. His smoking use included cigars and pipe. He has a 5.00 pack-year smoking history. He has quit using smokeless tobacco.  His smokeless tobacco use included chew. He reports current alcohol use of about 1.0 standard drink of alcohol per week. He reports that he does not use drugs.  ALLERGIES: Angiotensin receptor blockers, Lovastatin, Morphine, and Nifedipine  MEDICATIONS:  Current Outpatient Medications  Medication Sig Dispense Refill  . acetaminophen (TYLENOL) 500 MG tablet Take 500 mg by mouth every 6 (six) hours as needed (for pain.).     Marland Kitchen amLODipine (NORVASC) 10 MG tablet Take 1 tablet (10 mg total) by mouth every evening. 90 tablet 3  . aspirin 81 MG tablet Take 81 mg by mouth every evening.    Marland Kitchen atenolol (TENORMIN) 25 MG tablet TAKE ONE-HALF (1/2) TABLET  BY MOUTH TWICE A DAY (Patient taking differently: Take 12.5 mg by mouth 2 (two) times daily.) 90 tablet 3  . Carboxymethylcell-Hypromellose (GENTEAL OP) Apply 1 drop to eye 2 (two) times daily.    . clopidogrel (PLAVIX) 75 MG tablet Take 1 tablet (75 mg total) by mouth daily. 90 tablet 3  . ezetimibe (ZETIA) 10 MG tablet TAKE 1 TABLET BY MOUTH  DAILY (Patient taking differently: Take 10 mg by mouth daily.) 90 tablet 3  . finasteride (PROSCAR) 5 MG tablet Take 1 tablet (5 mg total) by mouth daily. (Patient taking differently: Take 2.5 mg by mouth  daily.) 90 tablet 3  . isosorbide mononitrate (IMDUR) 60 MG 24 hr tablet TAKE 1 TABLET BY MOUTH  DAILY (Patient taking differently: Take 60 mg by mouth daily.) 90 tablet 3  . Pitavastatin Calcium (LIVALO) 4 MG TABS Take 1 tablet (4 mg total) by mouth every evening. 90 tablet 3  . spironolactone (ALDACTONE) 25 MG tablet Take 0.5 tablets (12.5 mg total) by mouth daily. 45 tablet 3  . traZODone (DESYREL) 50 MG tablet Take 50 mg by mouth at bedtime as needed for sleep.    Marland Kitchen VASCEPA 1 g capsule TAKE 1 CAPSULE BY MOUTH  TWICE DAILY (Patient taking differently: Take 1 g by mouth 2 (two) times daily.) 180 capsule 3  . nitroGLYCERIN (NITROSTAT) 0.4 MG SL tablet PLACE 1 TABLET (0.4 MG TOTAL) UNDER THE TONGUE EVERY 5 (FIVE) MINUTES AS NEEDED FOR CHEST PAIN. (Patient not taking: No sig reported) 25 tablet 11   No current facility-administered medications for this encounter.    ECOG PERFORMANCE STATUS:  1 - Symptomatic but completely ambulatory  REVIEW OF SYSTEMS: Patient denies any weight loss, fatigue, weakness, fever, chills or night sweats. Patient denies any loss of vision, blurred vision. Patient denies any ringing  of the ears or hearing loss. No irregular heartbeat. Patient denies heart murmur or history of fainting.  Patient denies any chest pain or pain radiating to her upper extremities. Patient denies any shortness of breath, difficulty breathing at night, cough or hemoptysis. Patient denies any swelling in the lower legs. Patient denies any nausea vomiting, vomiting of blood, or coffee ground material in the vomitus. Patient denies any stomach pain. Patient states has had normal bowel movements no significant constipation or diarrhea. Patient denies any dysuria, hematuria or significant nocturia. Patient denies any problems walking, swelling in the joints or loss of balance. Patient denies any skin changes, loss of hair or loss of weight. Patient denies any excessive worrying or anxiety or significant  depression. Patient denies any problems with insomnia. Patient denies excessive thirst, polyuria, polydipsia. Patient denies any swollen glands, patient denies easy bruising or easy bleeding. Patient denies any recent infections, allergies or URI. Patient "s visual fields have not changed significantly in recent time.   PHYSICAL EXAM: BP (!) 160/75   Pulse 65   Temp (!) 97 F (36.1 C) (Tympanic)   Wt 188 lb (85.3 kg)   BMI 29.01 kg/m  No evidence of cervical or supraclavicular adenopathy is noted.  Well-developed well-nourished patient in NAD. HEENT reveals PERLA, EOMI, discs not visualized.  Oral cavity is clear. No oral mucosal lesions are identified. Neck is clear without evidence of cervical or supraclavicular adenopathy. Lungs are clear to A&P. Cardiac examination is essentially unremarkable with regular rate and rhythm without murmur rub or thrill. Abdomen is benign with no organomegaly or masses noted. Motor sensory and DTR levels are equal and symmetric in the upper and lower extremities. Cranial nerves II through XII are grossly intact. Proprioception is intact. No peripheral adenopathy or edema is identified. No motor or sensory levels are noted. Crude visual fields are within normal range.  LABORATORY DATA: Pathology report reviewed    RADIOLOGY RESULTS: PET CT scan reviewed compatible with above-stated findings   IMPRESSION: Stage I (T1 N0 M0) well-differentiated squamous cell carcinoma the larynx in 87 year old male  PLAN: This time I recommended a course of external beam treatment.  I would plan on delivering 66 Gray over 6 weeks using 3-dimensional treatment planning.  Risks and benefits of treatment including increased hoarseness possible dysphagia skin reaction and fatigue all were reviewed with the patient and his daughter in detail.  I have personally 7 ordered CT simulation for later this week.  Patient and daughter both seem to comprehend my treatment plan well.  I would  like to take this opportunity to thank you for allowing me to participate in the care of your patient.Noreene Filbert, MD

## 2020-04-25 DIAGNOSIS — J984 Other disorders of lung: Secondary | ICD-10-CM | POA: Diagnosis not present

## 2020-04-25 NOTE — Progress Notes (Signed)
Daily Session Note  Patient Details  Name: Nicholas Douglas MRN: [id number] Date of Birth: 1931-10-02 Referring Provider:   Flowsheet Row Pulmonary Rehab from 01/04/2020 in [facility]  Referring Provider [name] MD      Encounter Date: 04/25/2020  Check In:  Session Check In - 04/25/20 0713      Check-In   Supervising physician immediately available to respond to emergencies See telemetry face sheet for immediately available ER MD    Location [facility]-Card[facility]    Staff Present Birdie Sons, MPA, RN;Amanda Oletta Darter, BA, ACSM CEP, Exercise Physiologist;Kara Eliezer Bottom, MS Exercise Physiologist;Joseph Tessie Fass RCP,RRT,BSRT    Virtual Visit No    Medication changes reported     No    Fall or balance concerns reported    No    Tobacco Cessation No Change    Warm-up and Cool-down Performed on first and last piece of equipment    Resistance Training Performed Yes    VAD Patient? No    PAD/SET Patient? No      Pain Assessment   Currently in Pain? No/denies              Social History   Tobacco Use  Smoking Status Former Smoker  . Packs/day: 0.25  . Years: 20.00  . Pack years: 5.00  . Types: Cigars, Pipe  Smokeless Tobacco Former Systems developer  . Types: Chew  Tobacco Comment   Smokes about 2 cigars two months     Goals Met:  Independence with exercise equipment Exercise tolerated well No report of cardiac concerns or symptoms Strength training completed today  Goals Unmet:  Not Applicable  Comments: Pt able to follow exercise prescription today without complaint.  Will continue to monitor for progression.    Dr. Emily Filbert is Medical Director for [place] and [facility].

## 2020-04-26 ENCOUNTER — Encounter: Payer: Self-pay | Admitting: Oncology

## 2020-04-26 ENCOUNTER — Ambulatory Visit
Admission: RE | Admit: 2020-04-26 | Discharge: 2020-04-26 | Disposition: A | Discharge status: Home / Self Care | Payer: Medicare Other | Source: Ambulatory Visit | Attending: Radiation Oncology | Admitting: Radiation Oncology

## 2020-04-26 ENCOUNTER — Encounter: Payer: Self-pay | Admitting: *Deleted

## 2020-04-26 DIAGNOSIS — C329 Malignant neoplasm of larynx, unspecified: Secondary | ICD-10-CM | POA: Diagnosis present

## 2020-04-26 DIAGNOSIS — J984 Other disorders of lung: Secondary | ICD-10-CM

## 2020-04-26 DIAGNOSIS — Z510 Encounter for antineoplastic radiation therapy: Secondary | ICD-10-CM | POA: Diagnosis present

## 2020-04-26 NOTE — Progress Notes (Signed)
Pulmonary Individual Treatment Plan  Patient Details  Name: Nicholas Douglas MRN: [id number] Date of Birth: July 12, 1931 Referring Provider:   Flowsheet Row Pulmonary Rehab from 01/04/2020 in [facility] Cardiac and Pulmonary Rehab  Referring Provider [name] MD      Initial Encounter Date:  Flowsheet Row Pulmonary Rehab from 01/04/2020 in [facility] Cardiac and Pulmonary Rehab  Date 01/04/20      Visit Diagnosis: Restrictive lung disease  Patient's Home Medications on Admission:  Current Outpatient Medications:  .  acetaminophen (TYLENOL) 500 MG tablet, Take 500 mg by mouth every 6 (six) hours as needed (for pain.). , Disp: , Rfl:  .  amLODipine (NORVASC) 10 MG tablet, Take 1 tablet (10 mg total) by mouth every evening., Disp: 90 tablet, Rfl: 3 .  aspirin 81 MG tablet, Take 81 mg by mouth every evening., Disp: , Rfl:  .  atenolol (TENORMIN) 25 MG tablet, TAKE ONE-HALF (1/2) TABLET  BY MOUTH TWICE A DAY (Patient taking differently: Take 12.5 mg by mouth 2 (two) times daily.), Disp: 90 tablet, Rfl: 3 .  Carboxymethylcell-Hypromellose (GENTEAL OP), Apply 1 drop to eye 2 (two) times daily., Disp: , Rfl:  .  clopidogrel (PLAVIX) 75 MG tablet, Take 1 tablet (75 mg total) by mouth daily., Disp: 90 tablet, Rfl: 3 .  ezetimibe (ZETIA) 10 MG tablet, TAKE 1 TABLET BY MOUTH  DAILY (Patient taking differently: Take 10 mg by mouth daily.), Disp: 90 tablet, Rfl: 3 .  finasteride (PROSCAR) 5 MG tablet, Take 1 tablet (5 mg total) by mouth daily. (Patient taking differently: Take 2.5 mg by mouth daily.), Disp: 90 tablet, Rfl: 3 .  isosorbide mononitrate (IMDUR) 60 MG 24 hr tablet, TAKE 1 TABLET BY MOUTH  DAILY (Patient taking differently: Take 60 mg by mouth daily.), Disp: 90 tablet, Rfl: 3 .  nitroGLYCERIN (NITROSTAT) 0.4 MG SL tablet, PLACE 1 TABLET (0.4 MG TOTAL) UNDER THE TONGUE EVERY 5 (FIVE) MINUTES AS NEEDED FOR CHEST PAIN. (Patient not taking: No sig reported), Disp: 25 tablet, Rfl: 11 .  Pitavastatin  Calcium (LIVALO) 4 MG TABS, Take 1 tablet (4 mg total) by mouth every evening., Disp: 90 tablet, Rfl: 3 .  spironolactone (ALDACTONE) 25 MG tablet, Take 0.5 tablets (12.5 mg total) by mouth daily., Disp: 45 tablet, Rfl: 3 .  traZODone (DESYREL) 50 MG tablet, Take 50 mg by mouth at bedtime as needed for sleep., Disp: , Rfl:  .  VASCEPA 1 g capsule, TAKE 1 CAPSULE BY MOUTH  TWICE DAILY (Patient taking differently: Take 1 g by mouth 2 (two) times daily.), Disp: 180 capsule, Rfl: 3  Past Medical History: Past Medical History:  Diagnosis Date  . BPH (benign prostatic hyperplasia)    Followed by [name]/urology  . CAD S/P PTCA only of RPAV-PL 05/31/2015   99% --> 0%PAV - PTCA only (unable to advance STENT) due to RCA calcification - prox & distal RCA 30%; Patent LAD stent & Cx stent (~20% ISR).   . Calculus of kidney    "that's how they found the cancer" (04/27/2013)  . Coronary artery disease involving native coronary artery of native heart with unstable angina pectoris ([place]) 12/03/2011   S/P Cardiac angioplasty 1986, 1991, 1993, 1997.  Last cardiac catheterization 2001.  Cardiolite 05/2011 low risk with normal EF 65%.  Followed by cardiology/[name] every six months.   . Dyspnea    only on exertion  . History of myocardial infarction 1976  . Hypertension   . Impotence of organic  origin   . Internal hemorrhoids without mention of complication   . Malignant neoplasm of kidney, except pelvis   . Melanoma ([place]) 06/2010   Left arm  . 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  . Myocardial infarction ([place])   . Other abnormal blood chemistry   . Other nonspecific abnormal serum enzyme levels   . Personal history of colonic polyps   . Pure hypercholesterolemia   . Tobacco use disorder   . Unspecified congenital cystic kidney disease   . Unstable angina ([place]) 04/05/2013; 05/2015    Tobacco Use: Social History   Tobacco Use  Smoking Status Former Smoker  .  Packs/day: 0.25  . Years: 20.00  . Pack years: 5.00  . Types: Cigars, Pipe  Smokeless Tobacco Former Systems developer  . Types: Chew  Tobacco Comment   Smokes about 2 cigars two months     Labs: Recent Review Flowsheet Data    Labs for ITP Cardiac and Pulmonary Rehab Latest Ref Rng & Units 11/07/2017 11/18/2017 05/25/2018 05/13/2019 11/23/2019   Cholestrol 100 - 199 mg/dL 148 124 146 132 152   LDLCALC 0 - 99 mg/dL 66 57 72 62 78   HDL >39 mg/dL 57 51 52 49 52   Trlycerides 0 - 149 mg/dL 124 82 109 116 122   Hemoglobin A1c <5.7 % - - - - -       Pulmonary Assessment Scores:  Pulmonary Assessment Scores    Row Name 01/04/20 1549         ADL UCSD   ADL Phase Entry     SOB Score total 30     Rest 0     Walk 2     Stairs 3     Bath 2     Dress 0     Shop 2           CAT Score   CAT Score 15           mMRC Score   mMRC Score 1            UCSD: Self-administered rating of dyspnea associated with activities of daily living (ADLs) 6-point scale (0 = "not at all" to 5 = "maximal or unable to do because of breathlessness")  Scoring Scores range from 0 to 120.  Minimally important difference is 5 units  CAT: CAT can identify the health impairment of COPD patients and is better correlated with disease progression.  CAT has a scoring range of zero to 40. The CAT score is classified into four groups of low (less than 10), medium (10 - 20), high (21-30) and very high (31-40) based on the impact level of disease on health status. A CAT score over 10 suggests significant symptoms.  A worsening CAT score could be explained by an exacerbation, poor medication adherence, poor inhaler technique, or progression of COPD or comorbid conditions.  CAT MCID is 2 points  mMRC: mMRC (Modified Medical Research Council) Dyspnea Scale is used to assess the degree of baseline functional disability in patients of respiratory disease due to dyspnea. No minimal important difference is established. A decrease  in score of 1 point or greater is considered a positive change.   Pulmonary Function Assessment:  Pulmonary Function Assessment - 12/23/19 0901      Breath   Shortness of Breath No           Exercise Target Goals: Exercise Program Goal: Individual exercise prescription set  using results from initial 6 min walk test and THRR while considering  patient's activity barriers and safety.   Exercise Prescription Goal: Initial exercise prescription builds to 30-45 minutes a day of aerobic activity, 2-3 days per week.  Home exercise guidelines will be given to patient during program as part of exercise prescription that the participant will acknowledge.  Education: Aerobic Exercise: - Group verbal and visual presentation on the components of exercise prescription. Introduces F.I.T.T principle from ACSM for exercise prescriptions.  Reviews F.I.T.T. principles of aerobic exercise including progression. Written material given at graduation.   Education: Resistance Exercise: - Group verbal and visual presentation on the components of exercise prescription. Introduces F.I.T.T principle from ACSM for exercise prescriptions  Reviews F.I.T.T. principles of resistance exercise including progression. Written material given at graduation.    Education: Exercise & Equipment Safety: - Individual verbal instruction and demonstration of equipment use and safety with use of the equipment. Flowsheet Row Pulmonary Rehab from 02/24/2020 in [facility] Cardiac and Pulmonary Rehab  Date 12/23/19  Educator [facility]  Instruction Review Code 1- Verbalizes Understanding      Education: Exercise Physiology & General Exercise Guidelines: - Group verbal and written instruction with models to review the exercise physiology of the cardiovascular system and associated critical values. Provides general exercise guidelines with specific guidelines to those with heart or lung disease.    Education: Flexibility, Balance, Mind/Body  Relaxation: - Group verbal and visual presentation with interactive activity on the components of exercise prescription. Introduces F.I.T.T principle from ACSM for exercise prescriptions. Reviews F.I.T.T. principles of flexibility and balance exercise training including progression. Also discusses the mind body connection.  Reviews various relaxation techniques to help reduce and manage stress (i.e. Deep breathing, progressive muscle relaxation, and visualization). Balance handout provided to take home. Written material given at graduation.   Activity Barriers & Risk Stratification:  Activity Barriers & Cardiac Risk Stratification - 01/04/20 1546      Activity Barriers & Cardiac Risk Stratification   Activity Barriers Shortness of Breath;Joint Problems;Deconditioning           6 Minute Walk:  6 Minute Walk    Row Name 01/04/20 1536         6 Minute Walk   Phase Initial     Distance 1392 feet     Walk Time 6 minutes     # of Rest Breaks 0     MPH 2.63     METS 2.25     RPE 11     Perceived Dyspnea  1     VO2 Peak 7.79     Symptoms No     Resting HR 62 bpm     Resting BP 116/64     Resting Oxygen Saturation  95 %     Exercise Oxygen Saturation  during 6 min walk 85 %     Max Ex. HR 98 bpm     Max Ex. BP 156/70     2 Minute Post BP 124/64           Interval HR   1 Minute HR 78     2 Minute HR 91     3 Minute HR 94     4 Minute HR 95     5 Minute HR 95     6 Minute HR 98     2 Minute Post HR 71     Interval Heart Rate? Yes  Interval Oxygen   Interval Oxygen? Yes     Baseline Oxygen Saturation % 95 %     1 Minute Oxygen Saturation % 91 %     1 Minute Liters of Oxygen 0 L  RA     2 Minute Oxygen Saturation % 88 %     2 Minute Liters of Oxygen 0 L     3 Minute Oxygen Saturation % 86 %     3 Minute Liters of Oxygen 0 L     4 Minute Oxygen Saturation % 85 %     4 Minute Liters of Oxygen 0 L     5 Minute Oxygen Saturation % 86 %     5 Minute Liters of  Oxygen 0 L     6 Minute Oxygen Saturation % 86 %     6 Minute Liters of Oxygen 0 L     2 Minute Post Oxygen Saturation % 94 %     2 Minute Post Liters of Oxygen 0 L           Oxygen Initial Assessment:  Oxygen Initial Assessment - 01/04/20 1549      Home Oxygen   Home Oxygen Device None    Sleep Oxygen Prescription None    Home Exercise Oxygen Prescription None    Home Resting Oxygen Prescription None    Compliance with Home Oxygen Use Yes      Initial 6 min Walk   Oxygen Used None      Program Oxygen Prescription   Program Oxygen Prescription None      Intervention   Short Term Goals To learn and understand importance of monitoring SPO2 with pulse oximeter and demonstrate accurate use of the pulse oximeter.;To learn and understand importance of maintaining oxygen saturations>88%;To learn and demonstrate proper pursed lip breathing techniques or other breathing techniques.;To learn and demonstrate proper use of respiratory medications    Long  Term Goals Maintenance of O2 saturations>88%;Verbalizes importance of monitoring SPO2 with pulse oximeter and return demonstration;Exhibits proper breathing techniques, such as pursed lip breathing or other method taught during program session;Demonstrates proper use of MDI's;Compliance with respiratory medication           Oxygen Re-Evaluation:  Oxygen Re-Evaluation    Row Name 01/06/20 0749 02/11/20 0723 03/14/20 0740 04/20/20 0733       Program Oxygen Prescription   Program Oxygen Prescription None None None None         Home Oxygen   Home Oxygen Device None None None None    Sleep Oxygen Prescription None None None None    Home Exercise Oxygen Prescription None None None None    Home Resting Oxygen Prescription None None None None    Compliance with Home Oxygen Use Yes Yes Yes Yes         Goals/Expected Outcomes   Short Term Goals To learn and understand importance of monitoring SPO2 with pulse oximeter and demonstrate  accurate use of the pulse oximeter.;To learn and understand importance of maintaining oxygen saturations>88%;To learn and demonstrate proper pursed lip breathing techniques or other breathing techniques. To learn and understand importance of maintaining oxygen saturations>88%;To learn and demonstrate proper pursed lip breathing techniques or other breathing techniques. To learn and understand importance of maintaining oxygen saturations>88%;To learn and demonstrate proper pursed lip breathing techniques or other breathing techniques. To learn and understand importance of maintaining oxygen saturations>88%;To learn and demonstrate proper pursed lip breathing techniques or other breathing techniques.;To learn and understand  importance of monitoring SPO2 with pulse oximeter and demonstrate accurate use of the pulse oximeter.    Long  Term Goals Maintenance of O2 saturations>88%;Verbalizes importance of monitoring SPO2 with pulse oximeter and return demonstration;Exhibits proper breathing techniques, such as pursed lip breathing or other method taught during program session Maintenance of O2 saturations>88%;Verbalizes importance of monitoring SPO2 with pulse oximeter and return demonstration Maintenance of O2 saturations>88%;Verbalizes importance of monitoring SPO2 with pulse oximeter and return demonstration Maintenance of O2 saturations>88%;Verbalizes importance of monitoring SPO2 with pulse oximeter and return demonstration;Exhibits proper breathing techniques, such as pursed lip breathing or other method taught during program session    Comments Reviewed PLB technique with pt.  Talked about how it works and it's importance in maintaining their exercise saturations. Nicholas Douglas has been trying to use PLB when he can. He knows that it is difficult to do but why it is important. Informed him that he needs to be 88 percent and above. He bought a pulse oximeter last week and knows how and when to use it. Nicholas Douglas hasn't been  using PLB due to hoarseness in his throat.  He sees Dr for this tomorrow. Nicholas Douglas has not noticed much difference in his breathing.  He is using PLB on occasion.  He is currently getting work up for tumor.  We will continue to monitor his breathing.    Goals/Expected Outcomes Short: Become more profiecient at using PLB.   Long: Become independent at using PLB. Short: get more familiare with pulse oximeter and PLB. Long: use pulse oximeter and PLB independently Short: follow up with Dr Laverta [place]:  get back to using PLB Short: Continue to work on tumor work up Long: Continue to use PLB           Oxygen Discharge (Final Oxygen Re-Evaluation):  Oxygen Re-Evaluation - 04/20/20 0733      Program Oxygen Prescription   Program Oxygen Prescription None      Home Oxygen   Home Oxygen Device None    Sleep Oxygen Prescription None    Home Exercise Oxygen Prescription None    Home Resting Oxygen Prescription None    Compliance with Home Oxygen Use Yes      Goals/Expected Outcomes   Short Term Goals To learn and understand importance of maintaining oxygen saturations>88%;To learn and demonstrate proper pursed lip breathing techniques or other breathing techniques.;To learn and understand importance of monitoring SPO2 with pulse oximeter and demonstrate accurate use of the pulse oximeter.    Long  Term Goals Maintenance of O2 saturations>88%;Verbalizes importance of monitoring SPO2 with pulse oximeter and return demonstration;Exhibits proper breathing techniques, such as pursed lip breathing or other method taught during program session    Comments Nicholas Douglas has not noticed much difference in his breathing.  He is using PLB on occasion.  He is currently getting work up for tumor.  We will continue to monitor his breathing.    Goals/Expected Outcomes Short: Continue to work on tumor work up Long: Continue to use PLB           Initial Exercise Prescription:  Initial Exercise Prescription - 01/04/20 1500      Date  of Initial Exercise RX and Referring Provider   Date 01/04/20    Referring Provider [name] MD      Treadmill   MPH 2.2    Grade 0    Minutes 15    METs 2.68      NuStep   Level 2    SPM  80    Minutes 15    METs 2.2      REL-XR   Level 2    Speed 50    Minutes 15    METs 2.2      Prescription Details   Frequency (times per week) 2    Duration Progress to 30 minutes of continuous aerobic without signs/symptoms of physical distress      Intensity   THRR 40-80% of Max Heartrate 90-119    Ratings of Perceived Exertion 11-13    Perceived Dyspnea 0-4      Progression   Progression Continue to progress workloads to maintain intensity without signs/symptoms of physical distress.      Resistance Training   Training Prescription Yes    Weight 3 lb    Reps 10-15           Perform Capillary Blood Glucose checks as needed.  Exercise Prescription Changes:  Exercise Prescription Changes    Row Name 01/04/20 1500 01/19/20 0900 02/01/20 1400 02/14/20 0900 02/28/20 1400     Response to Exercise   Blood Pressure (Admit) 116/64 124/66 104/58 122/64 118/60   Blood Pressure (Exercise) 156/70 116/66 148/70 130/70 132/68   Blood Pressure (Exit) 124/64 106/60 102/64 122/64 108/62   Heart Rate (Admit) 62 bpm 65 bpm 62 bpm 66 bpm 61 bpm   Heart Rate (Exercise) 98 bpm 99 bpm 99 bpm 90 bpm 86 bpm   Heart Rate (Exit) 62 bpm 74 bpm 74 bpm 71 bpm 75 bpm   Oxygen Saturation (Admit) 95 % 95 % 95 % 94 % 93 %   Oxygen Saturation (Exercise) 85 % 89 % 88 % 93 % 91 %   Oxygen Saturation (Exit) 95 % 95 % 89 % 97 % 93 %   Rating of Perceived Exertion (Exercise) '11 14 11 13 13   ' Perceived Dyspnea (Exercise) 1 1 0 1 0   Symptoms none none none none none   Comments walk test results -- -- -- --   Duration -- Progress to 30 minutes of  aerobic without signs/symptoms of physical distress Continue with 30 min of aerobic exercise without signs/symptoms of physical distress. Continue with 30  min of aerobic exercise without signs/symptoms of physical distress. Continue with 30 min of aerobic exercise without signs/symptoms of physical distress.   Intensity -- THRR unchanged THRR unchanged THRR unchanged THRR unchanged     Progression   Progression -- Continue to progress workloads to maintain intensity without signs/symptoms of physical distress. Continue to progress workloads to maintain intensity without signs/symptoms of physical distress. Continue to progress workloads to maintain intensity without signs/symptoms of physical distress. Continue to progress workloads to maintain intensity without signs/symptoms of physical distress.   Average METs -- 3.82 3.14 3.3 3.3     Resistance Training   Training Prescription -- Yes Yes Yes Yes   Weight -- 3 lb 5 lb 5 lb 5 lb   Reps -- 10-15 10-15 10-15 10-15     Interval Training   Interval Training -- No No No No     Treadmill   MPH -- 2.2 2.5 -- 2.7   Grade -- 0 1 -- 1.5   Minutes -- 15 15 -- 15   METs -- 2.68 3.26 -- 3.63     Recumbant Bike   Level -- -- -- 1 --   Minutes -- -- -- 15 --     NuStep   Level -- '2 3 4 ' 4  SPM -- -- 80 -- 80   Minutes -- '15 15 15 15   ' METs -- 3.6 3 3.6 2.9     REL-XR   Level -- 4 -- -- --   Minutes -- 15 -- -- --   METs -- 5.2 -- -- --     Biostep-RELP   Level -- -- -- 3 --   Minutes -- -- -- 15 --   METs -- -- -- 3 --     Home Exercise Plan   Plans to continue exercise at -- -- -- Home (comment)  walking on treadmill --   Frequency -- -- -- Add 2 additional days to program exercise sessions. --   Initial Home Exercises Provided -- -- -- 01/25/20 --   [place] Name 03/13/20 0900 03/27/20 1700 04/12/20 0800 04/24/20 1700       Response to Exercise   Blood Pressure (Admit) 108/68 122/64 114/66 130/80    Blood Pressure (Exercise) 128/72 162/68 144/70 146/80    Blood Pressure (Exit) 130/66 116/68 118/62 120/58    Heart Rate (Admit) 59 bpm 55 bpm 58 bpm 61 bpm    Heart Rate (Exercise) 82  bpm 77 bpm 100 bpm 100 bpm    Heart Rate (Exit) 65 bpm 78 bpm 73 bpm 69 bpm    Oxygen Saturation (Admit) 91 % 93 % 93 % 92 %    Oxygen Saturation (Exercise) 89 % 91 % 87 % 89 %    Oxygen Saturation (Exit) 93 % 94 % 95 % 93 %    Rating of Perceived Exertion (Exercise) '12 12 12 12    ' Perceived Dyspnea (Exercise) '1 1 2 2    ' Symptoms none none none none    Duration Continue with 30 min of aerobic exercise without signs/symptoms of physical distress. Continue with 30 min of aerobic exercise without signs/symptoms of physical distress. Continue with 30 min of aerobic exercise without signs/symptoms of physical distress. Continue with 30 min of aerobic exercise without signs/symptoms of physical distress.    Intensity THRR unchanged THRR unchanged THRR unchanged THRR unchanged         Progression   Progression Continue to progress workloads to maintain intensity without signs/symptoms of physical distress. Continue to progress workloads to maintain intensity without signs/symptoms of physical distress. Continue to progress workloads to maintain intensity without signs/symptoms of physical distress. Continue to progress workloads to maintain intensity without signs/symptoms of physical distress.    Average METs 3.94 3.5 3.8 4.1         Resistance Training   Training Prescription Yes Yes Yes Yes    Weight 5 lb 6 lb 6 lb 6 lb    Reps 10-15 10-15 10-15 10-15         Interval Training   Interval Training No No No --         Treadmill   MPH 3.2 3.'2 3 3    ' Grade 2 1.5 0 2    Minutes '15 15 15 15    ' METs 4.11 3.92 3.3 4.12         Recumbant Bike   Level -- -- 3.6 --    Minutes -- -- 15 --    METs -- -- 4.6 --         NuStep   Level '5 5 5 5    ' SPM -- 80 -- 80    Minutes '15 15 15 15    ' METs 3.7 3.1 3.5 4.1  Biostep-RELP   Level 4 -- -- --    Minutes 15 -- -- --    METs 4 -- -- --         Home Exercise Plan   Plans to continue exercise at Home (comment)  walking on treadmill  -- Home (comment)  walking on treadmill Home (comment)  walking on treadmill    Frequency Add 2 additional days to program exercise sessions. -- Add 2 additional days to program exercise sessions. Add 2 additional days to program exercise sessions.    Initial Home Exercises Provided 01/25/20 -- 01/25/20 01/25/20           Exercise Comments:  Exercise Comments    Row Name 01/06/20 [phone]           Exercise Comments First full day of exercise!  Patient was oriented to gym and equipment including functions, settings, policies, and procedures.  Patient's individual exercise prescription and treatment plan were reviewed.  All starting workloads were established based on the results of the 6 minute walk test done at initial orientation visit.  The plan for exercise progression was also introduced and progression will be customized based on patient's performance and goals.              Exercise Goals and Review:  Exercise Goals    Row Name 01/04/20 1545             Exercise Goals   Increase Physical Activity Yes       Intervention Provide advice, education, support and counseling about physical activity/exercise needs.;Develop an individualized exercise prescription for aerobic and resistive training based on initial evaluation findings, risk stratification, comorbidities and participant's personal goals.       Expected Outcomes Short Term: Attend rehab on a regular basis to increase amount of physical activity.;Long Term: Add in home exercise to make exercise part of routine and to increase amount of physical activity.;Long Term: Exercising regularly at least 3-5 days a week.       Increase Strength and Stamina Yes       Intervention Provide advice, education, support and counseling about physical activity/exercise needs.;Develop an individualized exercise prescription for aerobic and resistive training based on initial evaluation findings, risk stratification, comorbidities and  participant's personal goals.       Expected Outcomes Short Term: Increase workloads from initial exercise prescription for resistance, speed, and METs.;Short Term: Perform resistance training exercises routinely during rehab and add in resistance training at home;Long Term: Improve cardiorespiratory fitness, muscular endurance and strength as measured by increased METs and functional capacity (6MWT)       Able to understand and use rate of perceived exertion (RPE) scale Yes       Intervention Provide education and explanation on how to use RPE scale       Expected Outcomes Short Term: Able to use RPE daily in rehab to express subjective intensity level;Long Term:  Able to use RPE to guide intensity level when exercising independently       Able to understand and use Dyspnea scale Yes       Intervention Provide education and explanation on how to use Dyspnea scale       Expected Outcomes Short Term: Able to use Dyspnea scale daily in rehab to express subjective sense of shortness of breath during exertion;Long Term: Able to use Dyspnea scale to guide intensity level when exercising independently       Knowledge and understanding of Target Heart Rate Range (THRR)  Yes       Intervention Provide education and explanation of THRR including how the numbers were predicted and where they are located for reference       Expected Outcomes Short Term: Able to state/look up THRR;Short Term: Able to use daily as guideline for intensity in rehab;Long Term: Able to use THRR to govern intensity when exercising independently       Able to check pulse independently Yes       Intervention Provide education and demonstration on how to check pulse in carotid and radial arteries.;Review the importance of being able to check your own pulse for safety during independent exercise       Expected Outcomes Short Term: Able to explain why pulse checking is important during independent exercise;Long Term: Able to check pulse  independently and accurately       Understanding of Exercise Prescription Yes       Intervention Provide education, explanation, and written materials on patient's individual exercise prescription       Expected Outcomes Short Term: Able to explain program exercise prescription;Long Term: Able to explain home exercise prescription to exercise independently              Exercise Goals Re-Evaluation :  Exercise Goals Re-Evaluation    Row Name 01/06/20 0749 01/19/20 0951 01/25/20 0925 02/01/20 1429 02/11/20 0736     Exercise Goal Re-Evaluation   Exercise Goals Review Increase Physical Activity;Able to understand and use rate of perceived exertion (RPE) scale;Knowledge and understanding of Target Heart Rate Range (THRR);Understanding of Exercise Prescription;Increase Strength and Stamina;Able to understand and use Dyspnea scale;Able to check pulse independently Increase Strength and Stamina;Increase Physical Activity;Understanding of Exercise Prescription Increase Strength and Stamina;Increase Physical Activity;Understanding of Exercise Prescription Increase Physical Activity;Increase Strength and Stamina Knowledge and understanding of Target Heart Rate Range (THRR)   Comments Reviewed RPE and dyspnea scales, THR and program prescription with pt today.  Pt voiced understanding and was given a copy of goals to take home. Nicholas Douglas is off to a good start in rehab.  He has already gotten to 5.2 METs on the XR.  We will continue to monitor his prigress. Reviewed home exercise with pt today.  Pt plans to walk for exercise.  Patient golfs 3x/week but does not really do continuous aerobic exercise. Reviewed THR, pulse, RPE, sign and symptoms, pulse oximetery and when to call 911 or MD.  Also discussed weather considerations and indoor options.  Pt voiced understanding. Nicholas Douglas had increased TM to 2.7 and 4.5 % grade.  His oxygen dropped to 86% so staff had him rest and coninue at 2.5 and 1 % which kept oxygen 88% and  above.  Staff reviewed importance of maintaining O2 above 88%. Nicholas Douglas states that he plays golf 3 days a week and has been trying to do more exercise. Informed him of his THRR and why he needs to keep within range. Patient verbalizes understanding.   Expected Outcomes Short: Use RPE daily to regulate intensity. Long: Follow program prescription in THR. Short: Continue to attend rehab regularly Long: Continue to follow program prescription Short: Add 1-2 days of exercise at home Long: Exercise independently at home with no complications Short: keep oxygen above 88% at all times when exercising Long: improve stamina overall Short: when working out at home check HR. Long: check HR independently.   Nicholas Name 02/28/20 1407 03/13/20 0917 03/27/20 1708 04/12/20 0857 04/20/20 0726     Exercise Goal Re-Evaluation   Exercise Goals Review  Increase Physical Activity;Increase Strength and Stamina Increase Physical Activity;Increase Strength and Stamina;Understanding of Exercise Prescription Increase Physical Activity;Increase Strength and Stamina Increase Physical Activity;Increase Strength and Stamina;Understanding of Exercise Prescription Increase Physical Activity;Increase Strength and Stamina;Understanding of Exercise Prescription   Comments Nicholas Douglas has increased levels on TM He doesnt quite reach THR range.  He attends consistently.  Staff will monitor progress. Nicholas Douglas has continued to do well in rehab.  He is now up to 3.2 mph on the tredamill.  We will continue to monitor his porgress. Nicholas Douglas gradually increases speed on TM after warm up.  He has moved to 6 lb weights for strength work. Nicholas Douglas is doing well in rehab.  He is now on level 5 for the NuStep.  We will continue to monitor his progress. Nicholas Douglas is doing well in rehab. He is using his treadmill on his off days and feeling pretty good with it.  He is using 8 lb weights and plays golf too.   Expected Outcomes Short: work towards THR range Long: improve stamina Short: Make sure  maintianing spm to keep HR up Long; Continue to improve stamina. Short: continue to exercise consistently at [facility] and home Long: increase overall MET level Short: Add incline to treadmill Long: Continue to improve stamina. Short: continue to exercise on off days Long: Continue to improve stamina.   Douglas Name 04/24/20 1753 04/24/20 1754           Exercise Goal Re-Evaluation   Exercise Goals Review Increase Physical Activity;Increase Strength and Stamina Increase Physical Activity;Increase Strength and Stamina      Comments -- Nicholas Douglas continues to progress well with exercise.  he increases TM speed and grade as he goes through the 20 min.      Expected Outcomes -- Short:maintain consistent exercise Long:  buls overall stamina             Discharge Exercise Prescription (Final Exercise Prescription Changes):  Exercise Prescription Changes - 04/24/20 1700      Response to Exercise   Blood Pressure (Admit) 130/80    Blood Pressure (Exercise) 146/80    Blood Pressure (Exit) 120/58    Heart Rate (Admit) 61 bpm    Heart Rate (Exercise) 100 bpm    Heart Rate (Exit) 69 bpm    Oxygen Saturation (Admit) 92 %    Oxygen Saturation (Exercise) 89 %    Oxygen Saturation (Exit) 93 %    Rating of Perceived Exertion (Exercise) 12    Perceived Dyspnea (Exercise) 2    Symptoms none    Duration Continue with 30 min of aerobic exercise without signs/symptoms of physical distress.    Intensity THRR unchanged      Progression   Progression Continue to progress workloads to maintain intensity without signs/symptoms of physical distress.    Average METs 4.1      Resistance Training   Training Prescription Yes    Weight 6 lb    Reps 10-15      Treadmill   MPH 3    Grade 2    Minutes 15    METs 4.12      NuStep   Level 5    SPM 80    Minutes 15    METs 4.1      Home Exercise Plan   Plans to continue exercise at Home (comment)   walking on treadmill   Frequency Add 2 additional days to program  exercise sessions.    Initial Home Exercises Provided 01/25/20  Nutrition:  Target Goals: Understanding of nutrition guidelines, daily intake of sodium <1530m, cholesterol <2070m calories 30% from fat and 7% or less from saturated fats, daily to have 5 or more servings of fruits and vegetables.  Education: All About Nutrition: -Group instruction provided by verbal, written material, interactive activities, discussions, models, and posters to present general guidelines for heart healthy nutrition including fat, fiber, MyPlate, the role of sodium in heart healthy nutrition, utilization of the nutrition label, and utilization of this knowledge for meal planning. Follow up email sent as well. Written material given at graduation.   Biometrics:  Pre Biometrics - 01/04/20 1546      Pre Biometrics   Height 5' 7.5" (1.715 m)    Weight 192 lb 11.2 oz (87.4 kg)    BMI (Calculated) 29.72    Single Leg Stand 4.94 seconds            Nutrition Therapy Plan and Nutrition Goals:  Nutrition Therapy & Goals - 02/07/20 1340      Nutrition Therapy   Diet Heart healthy, low Na    Protein (specify units) 70g    Fiber 30 grams    Whole Grain Foods 3 servings    Saturated Fats 12 max. grams    Fruits and Vegetables 8 servings/day    Sodium 1.5 grams      Personal Nutrition Goals   Nutrition Goal Nicholas Douglas would not like to make any changes at this time.    Comments B: bacon and eggs or oatmeal D: pintos, potatoes, vegetables, 1 slice of bread, chicken, salmon, or steak once in a while. No salt added and uses olive oil (he will salt).  S: canned peaches. He has been maintaining weight. His wife cooks for him. He rpeorts not wanting to make any changes at this time. Discussed heart healthy eating,      Intervention Plan   Intervention Prescribe, educate and counsel regarding individualized specific dietary modifications aiming towards targeted core components such as weight, hypertension,  lipid management, diabetes, heart failure and other comorbidities.;Nutrition handout(s) given to patient.    Expected Outcomes Short Term Goal: Understand basic principles of dietary content, such as calories, fat, sodium, cholesterol and nutrients.;Short Term Goal: A plan has been developed with personal nutrition goals set during dietitian appointment.;Long Term Goal: Adherence to prescribed nutrition plan.           Nutrition Assessments:  MEDIFICTS Score Key:  ?70 Need to make dietary changes   40-70 Heart Healthy Diet  ? 40 Therapeutic Level Cholesterol Diet  [facility]  Picture Your Plate Total Score on Admission 46     Picture Your Plate Scores:  <4<55nhealthy dietary pattern with much room for improvement.  41-50 Dietary pattern unlikely to meet recommendations for good health and room for improvement.  51-60 More healthful dietary pattern, with some room for improvement.   >60 Healthy dietary pattern, although there may be some specific behaviors that could be improved.   Nutrition Goals Re-Evaluation:  Nutrition Goals Re-Evaluation    Row Name 01/25/20 0931 03/14/20 0729 04/20/20 0729         Goals   Nutrition Goal Patient has not completed their initial consultation with the RD at this time. Once scheduled, RD will establish nutrition goals with patient -- Watch portion sizes     Comment Patient has not had goals set up with RD at this time. Patient is really focusing  on portion control and cutting out white bread. Nicholas Douglas is still watching portion sizes and reducing white bread. Nicholas Douglas is doing pretty well with his diet.  His wife tries to make sure he is eating good.  They eat a lot of beans and fruit.  He is working on portion control.     Expected Outcome Short: Meet with RD Long: Maintain overall healthy diet Short:  continue to be aware of portion sizes Long: maintain overall healthy diet Short:  Continue to work on portion size Long: Continue to maintain healthy diet.            Nutrition Goals Discharge (Final Nutrition Goals Re-Evaluation):  Nutrition Goals Re-Evaluation - 04/20/20 0729      Goals   Nutrition Goal Watch portion sizes    Comment Nicholas Douglas is doing pretty well with his diet.  His wife tries to make sure he is eating good.  They eat a lot of beans and fruit.  He is working on portion control.    Expected Outcome Short: Continue to work on portion size Long: Continue to maintain healthy diet.           Psychosocial: Target Goals: Acknowledge presence or absence of significant depression and/or stress, maximize coping skills, provide positive support system. Participant is able to verbalize types and ability to use techniques and skills needed for reducing stress and depression.   Education: Stress, Anxiety, and Depression - Group verbal and visual presentation to define topics covered.  Reviews how body is impacted by stress, anxiety, and depression.  Also discusses healthy ways to reduce stress and to treat/manage anxiety and depression.  Written material given at graduation.   Education: Sleep Hygiene -Provides group verbal and written instruction about how sleep can affect your health.  Define sleep hygiene, discuss sleep cycles and impact of sleep habits. Review good sleep hygiene tips.    Initial Review & Psychosocial Screening:  Initial Psych Review & Screening - 12/23/19 0904      Initial Review   Current issues with None Identified      Family Dynamics   Good Support System? Yes    Comments He can look to his wife for support and has no concerns at this time.      Barriers   Psychosocial barriers to participate in program There are no identifiable barriers or psychosocial needs.;The patient should benefit from training in stress management and relaxation.      Screening Interventions   Interventions Encouraged to exercise;To provide support and  resources with identified psychosocial needs;Provide feedback about the scores to participant    Expected Outcomes Short Term goal: Utilizing psychosocial counselor, staff and physician to assist with identification of specific Stressors or current issues interfering with healing process. Setting desired goal for each stressor or current issue identified.;Long Term Goal: Stressors or current issues are controlled or eliminated.;Short Term goal: Identification and review with participant of any Quality of Life or Depression concerns found by scoring the questionnaire.;Long Term goal: The participant improves quality of Life and PHQ9 Scores as seen by post scores and/or verbalization of changes           Quality of Life Scores:  Scores of 19 and below usually indicate a poorer quality of life in these areas.  A difference of  2-3 points is a clinically meaningful difference.  A difference of 2-3 points in the total score of the Quality of Life Index has been associated with significant improvement in overall quality  of life, self-image, physical symptoms, and general health in studies assessing change in quality of life.  PHQ-9: Recent Review Flowsheet Data    Depression screen [facility] 2/9 01/04/2020 04/20/2015 12/22/2013   Decreased Interest 0 0 0   Down, Depressed, Hopeless 0 0 0   PHQ - 2 Score 0 0 0   Altered sleeping 0 - -   Tired, decreased energy 2 - -   Change in appetite 0 - -   Feeling bad or failure about yourself  0 - -   Trouble concentrating 0 - -   Moving slowly or fidgety/restless 0 - -   Suicidal thoughts 0 - -   PHQ-9 Score 2 - -   Difficult doing work/chores Not difficult at all - -     Interpretation of Total Score  Total Score Depression Severity:  1-4 = Minimal depression, 5-9 = Mild depression, 10-14 = Moderate depression, 15-19 = Moderately severe depression, 20-27 = Severe depression   Psychosocial Evaluation and Intervention:  Psychosocial Evaluation - 12/23/19  0905      Psychosocial Evaluation & Interventions   Interventions Relaxation education;Stress management education;Encouraged to exercise with the program and follow exercise prescription    Comments He can look to his wife for support and has no concerns at this time.    Expected Outcomes Short: Exercise regularly to support mental health and notify staff of any changes. Long: maintain mental health and well being through teaching of rehab or prescribed medications independently.    Continue Psychosocial Services  Follow up required by staff           Psychosocial Re-Evaluation:  Psychosocial Re-Evaluation    Row Name 01/25/20 [phone] 02/11/20 0734 03/14/20 0728 04/20/20 0730       Psychosocial Re-Evaluation   Current issues with None Identified None Identified -- Current Stress Concerns    Comments Nicholas Douglas is doing well mentally. Patient denies any concerns at this time regarding sleep, stress, depression. He states his number of hours of sleep is sometimes limited, however, does not effect hm during the day. He has great support from his wife who looks out for him. He stays social by going golfing 3x/week. Nicholas Douglas states that he has no issues at this time. His wife says that he is the most laid back person she knows. He states that the [employer] keeps him going and 90 percent of the time he feels great. Nicholas Douglas sleeps well and has no stress concerns at this time. 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  He is handling the best he can.  He had surgery two weeks ago and get PET scan results tomorrow.  The results were looking favorable and plans for treatment. He will keep [place] posted and continue to lean on his golf buddies to help.    Expected Outcomes Short: Continue attending Pulmonary Rehab Long: Maintain positive attitude with continuous exercise Short: Continue to exercise regularly to support mental health and notify staff of any changes. Long: maintain mental health and well being  through teaching of rehab or prescribed medications independently. Short:  continue to exercise regularly Long:  maintain positive outlook Short: Continue to stay positive about tumor Long: Continue to focus on positive.    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  He is handling the best he can.  He had surgery two weeks ago and get PET scan results tomorrow.  The results were looking favorable and plans for treatment. He will keep [place] posted and continue to lean on his golf buddies to help.    Expected Outcomes Short: Continue to stay positive about tumor Long: Continue to focus on positive.    Interventions Encouraged to attend Pulmonary Rehabilitation for the exercise    Continue Psychosocial Services  Follow up required by staff           Education: Education Goals: Education classes will be provided on a weekly basis, covering required topics. Participant will state understanding/return demonstration of topics presented.  Learning Barriers/Preferences:  Learning Barriers/Preferences - 12/23/19 0903      Learning Barriers/Preferences   Learning Barriers None    Learning Preferences None           General Pulmonary Education Topics:  Infection Prevention: - Provides verbal and written material to individual with discussion of infection control including proper hand washing and proper equipment cleaning during exercise  session. Flowsheet Row Pulmonary Rehab from 02/24/2020 in [facility] Cardiac and Pulmonary Rehab  Date 12/23/19  Educator [facility]  Instruction Review Code 1- Verbalizes Understanding      Falls Prevention: - Provides verbal and written material to individual with discussion of falls prevention and safety. Flowsheet Row Pulmonary Rehab from 02/24/2020 in [facility] Cardiac and Pulmonary Rehab  Date 12/23/19  Educator [facility]  Instruction Review Code 1- Verbalizes Understanding      Chronic Lung Disease Review: - Group verbal instruction with posters, models, PowerPoint presentations and videos,  to review new updates, new respiratory medications, new advancements in procedures and treatments. Providing information on websites and "800" numbers for continued self-education. Includes information about supplement oxygen, available portable oxygen systems, continuous and intermittent flow rates, oxygen safety, concentrators, and Medicare reimbursement for oxygen. Explanation of Pulmonary Drugs, including class, frequency, complications, importance of spacers, rinsing mouth after steroid MDI's, and proper cleaning methods for nebulizers. Review of basic lung anatomy and physiology related to function, structure, and complications of lung disease. Review of risk factors. Discussion about methods for diagnosing sleep apnea and types of masks and machines for OSA. Includes a review of the use of types of environmental controls: home humidity, furnaces, filters, dust mite/pet prevention, HEPA vacuums. Discussion about weather changes, air quality and the benefits of nasal washing. Instruction on Warning signs, infection symptoms, calling MD promptly, preventive modes, and value of vaccinations. Review of effective airway clearance, coughing and/or vibration techniques. Emphasizing that all should Create an Action Plan. Written material given at graduation. Flowsheet Row Pulmonary Rehab from 02/24/2020 in [facility] Cardiac and Pulmonary  Rehab  Date 01/06/20  Educator [facility]  Instruction Review Code 1- Verbalizes Understanding      AED/CPR: - Group verbal and written instruction with the use of models to demonstrate the basic use of the AED with the basic ABC's of resuscitation.    Anatomy and Cardiac Procedures: - Group verbal and visual presentation and models provide information about basic cardiac anatomy and function. Reviews the testing  methods done to diagnose heart disease and the outcomes of the test results. Describes the treatment choices: Medical Management, Angioplasty, or Coronary Bypass Surgery for treating various heart conditions including Myocardial Infarction, Angina, Valve Disease, and Cardiac Arrhythmias.  Written material given at graduation.   Medication Safety: - Group verbal and visual instruction to review commonly prescribed medications for heart and lung disease. Reviews the medication, class of the drug, and side effects. Includes the steps to properly store meds and maintain the prescription regimen.  Written material given at graduation.   Other: -Provides group and verbal instruction on various topics (see comments)   Knowledge Questionnaire Score:  Knowledge Questionnaire Score - 01/04/20 1548      Knowledge Questionnaire Score   Pre Score 14/18: Oxygen, Medications            Core Components/Risk Factors/Patient Goals at Admission:  Personal Goals and Risk Factors at Admission - 01/04/20 1547      Core Components/Risk Factors/Patient Goals on Admission    Weight Management Yes;Weight Loss    Intervention Weight Management: Provide education and appropriate resources to help participant work on and attain dietary goals.;Weight Management: Develop a combined nutrition and exercise program designed to reach desired caloric intake, while maintaining appropriate intake of nutrient and fiber, sodium and fats, and appropriate energy expenditure required for the weight goal.;Weight  Management/Obesity: Establish reasonable short term and long term weight goals.    Admit Weight 192 lb (87.1 kg)    Goal Weight: Short Term 187 lb (84.8 kg)    Goal Weight: Long Term 182 lb (82.6 kg)    Expected Outcomes Short Term: Continue to assess and modify interventions until short term weight is achieved;Long Term: Adherence to nutrition and physical activity/exercise program aimed toward attainment of established weight goal;Weight Loss: Understanding of general recommendations for a balanced deficit meal plan, which promotes 1-2 lb weight loss per week and includes a negative energy balance of [phone] kcal/d;Understanding recommendations for meals to include 15-35% energy as protein, 25-35% energy from fat, 35-60% energy from carbohydrates, less than 282m of dietary cholesterol, 20-35 gm of total fiber daily;Understanding of distribution of calorie intake throughout the day with the consumption of 4-5 meals/snacks    Tobacco Cessation Yes    Number of packs per day Smokes cigars- socially    Intervention Assist the participant in steps to quit. Provide individualized education and counseling about committing to Tobacco Cessation, relapse prevention, and pharmacological support that can be provided by physician.;OAdvice worker assist with locating and accessing local/national Quit Smoking programs, and support quit date choice.    Expected Outcomes Short Term: Will demonstrate readiness to quit, by selecting a quit date.;Short Term: Will quit all tobacco product use, adhering to prevention of relapse plan.;Long Term: Complete abstinence from all tobacco products for at least 12 months from quit date.    Improve shortness of breath with ADL's Yes    Intervention Provide education, individualized exercise plan and daily activity instruction to help decrease symptoms of SOB with activities of daily living.    Expected Outcomes Short Term: Improve cardiorespiratory fitness to achieve  a reduction of symptoms when performing ADLs;Long Term: Be able to perform more ADLs without symptoms or delay the onset of symptoms    Hypertension Yes    Intervention Provide education on lifestyle modifcations including regular physical activity/exercise, weight management, moderate sodium restriction and increased consumption of fresh fruit, vegetables, and low fat dairy, alcohol moderation, and smoking cessation.;Monitor prescription use compliance.  Expected Outcomes Short Term: Continued assessment and intervention until BP is < 140/30m HG in hypertensive participants. < 130/868mHG in hypertensive participants with diabetes, heart failure or chronic kidney disease.;Long Term: Maintenance of blood pressure at goal levels.    Lipids Yes    Intervention Provide education and support for participant on nutrition & aerobic/resistive exercise along with prescribed medications to achieve LDL <7069mHDL >45m41m  Expected Outcomes Short Term: Participant states understanding of desired cholesterol values and is compliant with medications prescribed. Participant is following exercise prescription and nutrition guidelines.;Long Term: Cholesterol controlled with medications as prescribed, with individualized exercise RX and with personalized nutrition plan. Value goals: LDL < 70mg10mL > 40 mg.           Education:Diabetes - Individual verbal and written instruction to review signs/symptoms of diabetes, desired ranges of glucose level fasting, after meals and with exercise. Acknowledge that pre and post exercise glucose checks will be done for 3 sessions at entry of program.   Know Your Numbers and Heart Failure: - Group verbal and visual instruction to discuss disease risk factors for cardiac and pulmonary disease and treatment options.  Reviews associated critical values for Overweight/Obesity, Hypertension, Cholesterol, and Diabetes.  Discusses basics of heart failure: signs/symptoms and  treatments.  Introduces Heart Failure Zone chart for action plan for heart failure.  Written material given at graduation.   Core Components/Risk Factors/Patient Goals Review:   Goals and Risk Factor Review    Row Name 01/25/20 0739 02/11/20 0731 03/14/20 0725 04/20/20 0727       Core Components/Risk Factors/Patient Goals Review   Personal Goals Review Weight Management/Obesity;Tobacco Cessation;Improve shortness of breath with ADL's;Lipids;Hypertension Improve shortness of breath with ADL's Improve shortness of breath with ADL's Improve shortness of breath with ADL's;Weight Management/Obesity;Hypertension    Review Nicholas Douglas iClair Gullingoing well. His SOB has immproved slighly. He really tries to practice using PLB when needed.  Does not have desire to quit cigars at this time as he smokes them socially while he golfs. BP is stable at home and at rehab- checks it couples times/ week, typically runs around 120-130s/70s. Right now, he thinks he gained 1-2 lbs due to extra food with the holidays. He is currently 192 lb and his goal is to get down to 180 lb. He is focusing on small servings and maintain appropriate portion sizes. Wife focuses on healthy diet, cutting out white bread to help. Spoke to patient about their shortness of breath and what they can do to improve. Patient has been informed of breathing techniques when starting the program. Patient is informed to tell staff if they have had any med changes and that certain meds they are taking or not taking can be causing shortness of breath. Nicholas Douglas sClair Gullinges he is taking medications as directed.  He has not noticed a significant improvement in shortness of breath.  He sees Dr tomorrow about hoarseness/ clearing his throat.  He hasnt been using PLB due to this. Nicholas Douglas's weight goes up and down depending on how he is eating.  Blood pressures have been pretty good for him at home and in class.  His breathing is about the same currently as he is getting worked up for a tumor on  his vocal cords.    Expected Outcomes -- Short: Attend [facility] regularly to improve shortness of breath with ADL's. Long: maintain independence with ADL's Short: follow up with Dr Long:Laverta Baltimoreintain independence with ADLs Short; Continue to learn abotu tumor.  Long: Continue to monitor risk factors.           Core Components/Risk Factors/Patient Goals at Discharge (Final Review):   Goals and Risk Factor Review - 04/20/20 0727      Core Components/Risk Factors/Patient Goals Review   Personal Goals Review Improve shortness of breath with ADL's;Weight Management/Obesity;Hypertension    Review Nicholas Douglas's weight goes up and down depending on how he is eating.  Blood pressures have been pretty good for him at home and in class.  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    Expected Outcomes Short; Continue to learn abotu tumor.  Long: Continue to monitor risk factors.           ITP Comments:  ITP Comments    Row Name 12/23/19 0909 01/04/20 1531 01/05/20 1039 01/06/20 0748 02/02/20 0624   ITP Comments Virtual Visit completed. Patient informed on EP and RD appointment and 6 Minute walk test. Patient also informed of patient health questionnaires on My Chart. Patient Verbalizes understanding. Visit diagnosis can be found in [facility] 11/16/2019. Completed 6MWT and gym orientation. Initial ITP created and sent for review to Dr. Emily Filbert, Medical Director. 30 Day review completed. Medical Director ITP review done, changes made as directed, and signed approval by Medical Director. New to program First full day of exercise!  Patient was oriented to gym and equipment including functions, settings, policies, and procedures.  Patient's individual exercise prescription and treatment plan were reviewed.  All starting workloads were established based on the results of the 6 minute walk test done at initial orientation visit.  The plan for exercise progression was  also introduced and progression will be customized based on patient's performance and goals. 30 Day review completed. Medical Director ITP review done, changes made as directed, and signed approval by Medical Director.   [place] Name 02/07/20 1351 03/01/20 0936 03/29/20 0701 04/26/20 1008     ITP Comments Completed initial RD Evaluation 30 Day review completed. Medical Director ITP review done, changes made as directed, and signed approval by Medical Director. 30 Day review completed. Medical Director ITP review done, changes made as directed, and signed approval by Medical Director. 30 Day review completed. Medical Director ITP review done, changes made as directed, and signed approval by Medical Director.           Comments:

## 2020-04-26 NOTE — Progress Notes (Signed)
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 Richardson Landry with symptoms of bilateral hearing loss and concerns for hoarseness of voice.  He is a current smoker.  Auto laryngoscopy exam revealed an erythematous almost papillary lesion involving the left true vocal cord.  Right true vocal cord was clear.  Hypopharynx was also clear.  This mass was biopsied and was consistent with squamous cell carcinoma.   PET CT scan did not show any evidence of locoregional disease or distant metastatic disease.  Laryngeal lesion was not visualized  Interval history-patient still has some hoarseness of voice but denies other complaints at this time  ECOG PS- 1 Pain scale- 0   Review of systems- Review of Systems  Constitutional: Negative for chills, fever, malaise/fatigue and weight loss.  HENT: Negative for congestion, ear discharge and nosebleeds.        Hoarseness of voice  Eyes: Negative for blurred vision.  Respiratory: Negative for cough, hemoptysis, sputum production, shortness of breath and wheezing.   Cardiovascular: Negative for chest pain, palpitations, orthopnea and claudication.  Gastrointestinal: Negative for abdominal pain, blood in stool, constipation, diarrhea, heartburn, melena, nausea and vomiting.  Genitourinary: Negative for dysuria, flank pain, frequency, hematuria and urgency.   Musculoskeletal: Negative for back pain, joint pain and myalgias.  Skin: Negative for rash.  Neurological: Negative for dizziness, tingling, focal weakness, seizures, weakness and headaches.  Endo/Heme/Allergies: Does not bruise/bleed easily.  Psychiatric/Behavioral: Negative for depression and suicidal ideas. The patient does not have insomnia.       Allergies  Allergen Reactions  . Angiotensin Receptor Blockers     Other reaction(s): Kidney Disorder Hyperkalemia  . Lovastatin Other (See Comments)    Elevated liver enzymes  . Morphine Hives  . Nifedipine Other (See Comments)    Elevated liver enzymes     Past Medical History:  Diagnosis Date  . BPH (benign prostatic hyperplasia)    Followed by [name]/[facility]rology  . CAD S/P PTCA only of RPAV-PL 05/31/2015   99% --> 0%PAV - PTCA only (unable to advance STENT) due to RCA calcification - prox & distal RCA 30%; Patent LAD stent & Cx stent (~20% ISR).   . Calculus of kidney    "that's how they found the cancer" (04/27/2013)  . Coronary artery disease involving native coronary artery of native heart with unstable angina pectoris ([place]) 12/03/2011   S/P Cardiac angioplasty 1986, 1991, 1993, 1997.  Last cardiac catheterization 2001.  Cardiolite 05/2011 low risk with normal EF 65%.  Followed by cardiology/[name] every six months.   . Dyspnea    only on exertion  . History of myocardial infarction 1976  . Hypertension   . Impotence of organic origin   . Internal hemorrhoids without mention of complication   . Malignant neoplasm of kidney, except pelvis   . Melanoma ([place]) 06/2010   Left arm  . 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  . Myocardial infarction ([place])   . Other abnormal blood chemistry   . Other nonspecific abnormal serum enzyme levels   . Personal history of colonic polyps   . Pure hypercholesterolemia   . Tobacco use disorder   . Unspecified congenital cystic kidney disease   . Unstable  angina ([place]) 04/05/2013; 05/2015     Past Surgical History:  Procedure Laterality Date  . APPENDECTOMY  2014  . CARDIAC CATHETERIZATION N/A 05/30/2015   Procedure: Left Heart Cath and Coronary Angiography;  Surgeon: [name], MD;  Location: [place] [facility];  Service: Cardiovascular;  Laterality: N/A;  . CARDIAC CATHETERIZATION N/A 05/30/2015   Procedure: Coronary Balloon Angioplasty;  Surgeon: [name], MD;  Location: [place] [facility];  Service: Cardiovascular;  Laterality: N/A;  . Cardiolite  05/06/2011   low risk study; normal EF.  [facility]  . carotid dopplers  03/08/2011   minimal plaque formation B. Symptoms: dizziness.  . COLONOSCOPY  10/05/2008   single polyp, IH.  [name].  Repeat in 3 years.  . CORONARY ANGIOPLASTY     "I've had 4" (04/27/2013)  . CORONARY ANGIOPLASTY WITH STENT PLACEMENT  04/2013; 04/27/2013   "2 + 1" (04/27/2013)  . EYE SURGERY  11/04/2013   Cataract surgery B; [place].  Marland Kitchen LEFT HEART CATH AND CORONARY ANGIOGRAPHY N/A 08/05/2016   Procedure: Left Heart Cath and Coronary Angiography;  Surgeon: Martinique, Peter M, MD;  Location: [place] [facility];  Service: Cardiovascular;  Laterality: N/A;  . LEFT HEART CATH AND CORONARY ANGIOGRAPHY N/A 01/04/2019   Procedure: LEFT HEART CATH AND CORONARY ANGIOGRAPHY;  Surgeon: [name], MD;  Location: [place] [facility];  Service: Cardiovascular;  Laterality: N/A;  . MELANOMA EXCISION Left ~ 2007   "arm"  . MELANOMA EXCISION  02/07/2015   R upper back. [facility]  . MICROLARYNGOSCOPY N/A 04/06/2020   Procedure: MICROLARYNGOSCOPY WITH BIOPSY OF LARYNX;  Surgeon: Clyde Canterbury, MD;  Location: [facility] ORS;  Service: ENT;  Laterality: N/A;  . NEPHRECTOMY Left 2006  . PERCUTANEOUS CORONARY ROTOBLATOR INTERVENTION (PCI-R) N/A 04/06/2013   Procedure: PERCUTANEOUS CORONARY ROTOBLATOR INTERVENTION (PCI-R);  Surgeon: [name], MD;  Location: [facility] C[facility] LAB;  Service: Cardiovascular;  Laterality: N/A;  . PERCUTANEOUS CORONARY STENT  INTERVENTION (PCI-S) N/A 04/27/2013   Procedure: PERCUTANEOUS CORONARY STENT INTERVENTION (PCI-S);  Surgeon: [name], MD;  Location: [facility] CATH LAB;  Service: Cardiovascular;  Laterality: N/A;    Social History   Socioeconomic History  . Marital status: Married    Spouse name: Not on file  . Number of children: 3  . Years of education: college  . Highest education level: Not on file  Occupational History  . Occupation: Ecologist: RETIRED    Comment: Personal assistant, [employer]  Tobacco Use  . Smoking status: Former Smoker    Packs/day: 0.25    Years: 20.00    Pack years: 5.00    Types: Cigars, Pipe  . Smokeless tobacco: Former Systems developer    Types: Chew  . Tobacco comment: Smokes about 2 cigars two months   Vaping Use  . Vaping Use: Never used  Substance and Sexual Activity  . Alcohol use: Yes    Alcohol/week: 1.0 standard drink    Types: 1 Glasses of wine per week    Comment: occasionally   . Drug use: No  . Sexual activity: Yes  Other Topics Concern  . Not on file  Social History Narrative   Marital status:  Married x 30 years; 2nd marriage      Children:  3 children, 2 step-children and 10 grandchildren.        Lives:  Lives with wife.         Employment:  Retired; Environmental education officer      Tobacco:  Former user; chews tobacco.      Alcohol:  5 glasses of wine per week       Drugs:  No drugs.        Exercise: 5 x week Light; plays golf 5 days per week but rides golf cart, walking 1 mile.         Advanced Directives:  Patient DOES NOT have living will; desires DNR/DNI.       ADLS: independent with all ADLs; drives.  No falls.  Does not use assistant devices with ambulation.     Social Determinants of Health   Financial Resource Strain: Not on file  Food Insecurity: Not on file  Transportation Needs: Not on file  Physical Activity: Not on file  Stress: Not on file  Social Connections: Not on file  Intimate Partner Violence: Not on file    Family History   Problem Relation Age of Onset  . Heart disease Mother        CAD, AAA.  Marland Kitchen AAA (abdominal aortic aneurysm) Mother      Current Outpatient Medications:  .  acetaminophen (TYLENOL) 500 MG tablet, Take 500 mg by mouth every 6 (six) hours as needed (for pain.). , Disp: , Rfl:  .  amLODipine (NORVASC) 10 MG tablet, Take 1 tablet (10 mg total) by mouth every evening., Disp: 90 tablet, Rfl: 3 .  aspirin 81 MG tablet, Take 81 mg by mouth every evening., Disp: , Rfl:  .  atenolol (TENORMIN) 25 MG tablet, TAKE ONE-HALF (1/2) TABLET  BY MOUTH TWICE A DAY (Patient taking differently: Take 12.5 mg by mouth 2 (two) times daily.), Disp: 90 tablet, Rfl: 3 .  Carboxymethylcell-Hypromellose (GENTEAL OP), Apply 1 drop to eye 2 (two) times daily., Disp: , Rfl:  .  clopidogrel (PLAVIX) 75 MG tablet, Take 1 tablet (75 mg total) by mouth daily., Disp: 90 tablet, Rfl: 3 .  ezetimibe (ZETIA) 10 MG tablet, TAKE 1 TABLET BY MOUTH  DAILY (Patient taking differently: Take 10 mg by mouth daily.), Disp: 90 tablet, Rfl: 3 .  finasteride (PROSCAR) 5 MG tablet, Take 1 tablet (5 mg total) by mouth daily. (Patient taking differently: Take 2.5 mg by mouth daily.), Disp: 90 tablet, Rfl: 3 .  isosorbide mononitrate (IMDUR) 60 MG 24 hr tablet, TAKE 1 TABLET BY MOUTH  DAILY (Patient taking differently: Take 60 mg by mouth daily.), Disp: 90 tablet, Rfl: 3 .  nitroGLYCERIN (NITROSTAT) 0.4 MG SL tablet, PLACE 1 TABLET (0.4 MG TOTAL) UNDER THE TONGUE EVERY 5 (FIVE) MINUTES AS NEEDED FOR CHEST PAIN. (Patient not taking: No sig reported), Disp: 25 tablet, Rfl: 11 .  Pitavastatin Calcium (LIVALO) 4 MG TABS, Take 1 tablet (4 mg total) by mouth every evening., Disp: 90 tablet, Rfl: 3 .  spironolactone (ALDACTONE) 25 MG tablet, Take 0.5 tablets (12.5 mg total) by mouth daily., Disp: 45 tablet, Rfl: 3 .  traZODone (DESYREL) 50 MG tablet, Take 50 mg by mouth at bedtime as needed for sleep., Disp: , Rfl:  .  VASCEPA 1 g capsule, TAKE 1 CAPSULE BY  MOUTH  TWICE DAILY (Patient taking differently: Take 1 g by mouth 2 (  two) times daily.), Disp: 180 capsule, Rfl: 3  Physical exam:  Physical Exam Cardiovascular:     Rate and Rhythm: Normal rate and regular rhythm.     Heart sounds: Normal heart sounds.  Pulmonary:     Effort: Pulmonary effort is normal.     Breath sounds: Normal breath sounds.  Abdominal:     General: Bowel sounds are normal.     Palpations: Abdomen is soft.  Skin:    General: Skin is warm and dry.  Neurological:     Mental Status: He is alert and oriented to person, place, and time.      CMP Latest Ref Rng & Units 11/23/2019  Glucose 65 - 99 mg/dL 95  BUN 8 - 27 mg/dL 18  Creatinine 0.76 - 1.27 mg/dL 1.29(H)  Sodium 134 - 144 mmol/L 141  Potassium 3.5 - 5.2 mmol/L 4.6  Chloride 96 - 106 mmol/L 103  CO2 20 - 29 mmol/L 22  Calcium 8.6 - 10.2 mg/dL 9.4  Total Protein 6.0 - 8.5 g/dL 7.1  Total Bilirubin 0.0 - 1.2 mg/dL 0.7  Alkaline Phos 44 - 121 IU/L 65  AST 0 - 40 IU/L 46(H)  ALT 0 - 44 IU/L 43   CBC Latest Ref Rng & Units 11/23/2019  WBC 3.4 - 10.8 x10E3/uL 6.2  Hemoglobin 13.0 - 17.7 g/dL 16.4  Hematocrit 37.5 - 51.0 % 51.0  Platelets 150 - 450 x10E3/uL 263    No images are attached to the encounter.  NM PET Image Initial (PI) Skull Base To Thigh  Result Date: 04/18/2020 CLINICAL DATA:  Initial treatment strategy for squamous cell carcinoma of the larynx. Status post biopsy 04/06/2020. EXAM: NUCLEAR MEDICINE PET SKULL BASE TO THIGH TECHNIQUE: 9.5 mCi F-18 FDG was injected intravenously. Full-ring PET imaging was performed from the skull base to thigh after the radiotracer. CT data was obtained and used for attenuation correction and anatomic localization. Fasting blood glucose: 104 mg/dl COMPARISON:  None FINDINGS: Mediastinal blood pool activity: SUV max 3.28 Liver activity: SUV max NA NECK: No hypermetabolic laryngeal mass is identified. No enlarged or hypermetabolic neck adenopathy to suggest  metastatic disease. Incidental CT findings: Bilateral carotid artery calcifications. CHEST: No hypermetabolic mediastinal or hilar nodes. No suspicious pulmonary nodules on the CT scan. Incidental CT findings: Aortic and three-vessel coronary artery calcifications are noted. ABDOMEN/PELVIS: No abnormal hypermetabolic activity within the liver, pancreas, adrenal glands, or spleen. No hypermetabolic lymph nodes in the abdomen or pelvis. Incidental CT findings: Status post left adrenalectomy and left nephrectomy. Advanced vascular calcifications but no aneurysm. Sigmoid colon diverticulosis. SKELETON: No focal hypermetabolic activity to suggest skeletal metastasis. Incidental CT findings: none IMPRESSION: 1. No hypermetabolic laryngeal mass is identified to suggest residual tumor. No neck adenopathy. 2. No findings for metastatic disease involving the chest, abdomen, pelvis or bony structures. Electronically Signed   By: Marijo Sanes M.D.   On: 04/18/2020 11:57     Assessment and plan- Patient is a 87 y.o. 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  Primary laryngeal lesion was not visualized on PET scan.  On ENT exam this was found to be restricted to the left vocal cord without involvement of the right vocal cord.  No evidence of involvement of hypopharynx.  This is therefore a T1 a lesion which can be managed with radiation alone.  Patient will be meeting with radiation oncology soon.  No role for systemic chemotherapy here.  I will defer further management and follow-up to radiation oncology for a repeat PET scan after treatment as well as subsequent ENT surveillance by Dr. Richardson Landry  Cancer Staging Squamous cell carcinoma of larynx [facility]) Staging form: Larynx - Glottis,  AJCC 8th Edition - Clinical: No stage assigned - Unsigned - Clinical stage from 04/21/2020: Stage I (cT1a, cN0, cM0) - Signed by Sindy Guadeloupe, MD on 04/26/2020     Visit Diagnosis 1. Squamous cell carcinoma of larynx (HCC)   2. Goals of care, counseling/discussion      Dr. Randa Evens, MD, MPH [facility] [phone] 04/26/2020 8:59 AM

## 2020-04-27 ENCOUNTER — Other Ambulatory Visit: Payer: Self-pay

## 2020-04-27 DIAGNOSIS — Z510 Encounter for antineoplastic radiation therapy: Secondary | ICD-10-CM | POA: Diagnosis not present

## 2020-04-27 DIAGNOSIS — J984 Other disorders of lung: Secondary | ICD-10-CM

## 2020-04-27 NOTE — Progress Notes (Signed)
Daily Session Note  Patient Details  Name: Nicholas Douglas MRN: [id number] Date of Birth: 10-Apr-1931 Referring Provider:   Flowsheet Row Pulmonary Rehab from 01/04/2020 in [facility]  Referring Provider [name] MD      Encounter Date: 04/27/2020  Check In:  Session Check In - 04/27/20 0731      Check-In   Supervising physician immediately available to respond to emergencies See telemetry face sheet for immediately available ER MD    Location [facility]-Cardiac & Pulmonary Rehab    Staff Present Birdie Sons, MPA, Mauricia Area, BS, ACSM CEP, Exercise Physiologist;Amanda Oletta Darter, BA, ACSM CEP, Exercise Physiologist    Virtual Visit No    Medication changes reported     No    Fall or balance concerns reported    No    Tobacco Cessation No Change    Current number of cigarettes/nicotine per day     0    Warm-up and Cool-down Performed on first and last piece of equipment    Resistance Training Performed Yes    VAD Patient? No    PAD/SET Patient? No      Pain Assessment   Currently in Pain? No/denies              Social History   Tobacco Use  Smoking Status Former Smoker  . Packs/day: 0.25  . Years: 20.00  . Pack years: 5.00  . Types: Cigars, Pipe  Smokeless Tobacco Former Systems developer  . Types: Chew  Tobacco Comment   Smokes about 2 cigars two months     Goals Met:  Independence with exercise equipment Exercise tolerated well No report of cardiac concerns or symptoms Strength training completed today  Goals Unmet:  Not Applicable  Comments: Pt able to follow exercise prescription today without complaint.  Will continue to monitor for progression.    Dr. Emily Filbert is Medical Director for [facility] and [facility].

## 2020-05-02 ENCOUNTER — Other Ambulatory Visit: Payer: Self-pay

## 2020-05-02 DIAGNOSIS — J984 Other disorders of lung: Secondary | ICD-10-CM | POA: Diagnosis not present

## 2020-05-02 NOTE — Progress Notes (Signed)
Daily Session Note  Patient Details  Name: Nicholas Douglas MRN: [id number] Date of Birth: [birth year] Referring Provider:   Flowsheet Row Pulmonary Rehab from 01/04/2020 in [facility] Cardiac and Pulmonary Rehab  Referring Provider [name] MD      Encounter Date: 05/02/2020  Check In:  Session Check In - 05/02/20 0714      Check-In   Supervising physician immediately available to respond to emergencies See telemetry face sheet for immediately available ER MD    Location [facility]-Cardiac & Pulmonary Rehab    Staff Present Birdie Sons, MPA, RN;Amanda Oletta Darter, BA, ACSM CEP, Exercise Physiologist;Joseph Tessie Fass RCP,RRT,BSRT    Virtual Visit No    Medication changes reported     No    Fall or balance concerns reported    No    Tobacco Cessation No Change    Warm-up and Cool-down Performed on first and last piece of equipment    Resistance Training Performed Yes    VAD Patient? No    PAD/SET Patient? No      Pain Assessment   Currently in Pain? No/denies              Social History   Tobacco Use  Smoking Status Former Smoker  . Packs/day: 0.25  . Years: 20.00  . Pack years: 5.00  . Types: Cigars, Pipe  Smokeless Tobacco Former Systems developer  . Types: Chew  Tobacco Comment   Smokes about 2 cigars two months     Goals Met:  Independence with exercise equipment Exercise tolerated well Personal goals reviewed No report of cardiac concerns or symptoms Strength training completed today  Goals Unmet:  Not Applicable  Comments: Pt able to follow exercise prescription today without complaint.  Will continue to monitor for progression.    Dr. Emily Filbert is Medical Director for [place] and [facility].

## 2020-05-03 ENCOUNTER — Ambulatory Visit: Admission: RE | Admit: 2020-05-03 | Payer: Medicare Other | Source: Ambulatory Visit

## 2020-05-03 DIAGNOSIS — Z510 Encounter for antineoplastic radiation therapy: Secondary | ICD-10-CM | POA: Diagnosis not present

## 2020-05-04 ENCOUNTER — Ambulatory Visit
Admission: RE | Admit: 2020-05-04 | Discharge: 2020-05-04 | Disposition: A | Discharge status: Home / Self Care | Payer: Medicare Other | Source: Ambulatory Visit | Attending: Radiation Oncology | Admitting: Radiation Oncology

## 2020-05-04 ENCOUNTER — Other Ambulatory Visit: Payer: Self-pay

## 2020-05-04 DIAGNOSIS — J984 Other disorders of lung: Secondary | ICD-10-CM

## 2020-05-04 DIAGNOSIS — Z510 Encounter for antineoplastic radiation therapy: Secondary | ICD-10-CM | POA: Diagnosis not present

## 2020-05-04 NOTE — Progress Notes (Signed)
Daily Session Note  Patient Details  Name: TAIVEN GREENLEY MRN: [id number] Date of Birth: 1931-09-08 Referring Provider:   Flowsheet Row Pulmonary Rehab from 01/04/2020 in [facility] Cardiac and Pulmonary Rehab  Referring Provider [name] MD      Encounter Date: 05/04/2020  Check In:  Session Check In - 05/04/20 6213      Check-In   Supervising physician immediately available to respond to emergencies See telemetry face sheet for immediately available ER MD    Location [facility]-Cardiac & Pulmonary Rehab    Staff Present Birdie Sons, MPA, Mauricia Area, BS, ACSM CEP, Exercise Physiologist;Colleen Mel Almond, RN, BSN    Virtual Visit No    Medication changes reported     No    Fall or balance concerns reported    No    Tobacco Cessation No Change    Warm-up and Cool-down Performed on first and last piece of equipment    Resistance Training Performed Yes    VAD Patient? No    PAD/SET Patient? No      Pain Assessment   Currently in Pain? No/denies              Social History   Tobacco Use  Smoking Status Former Smoker  . Packs/day: 0.25  . Years: 20.00  . Pack years: 5.00  . Types: Cigars, Pipe  Smokeless Tobacco Former Systems developer  . Types: Chew  Tobacco Comment   Smokes about 2 cigars two months     Goals Met:  Independence with exercise equipment Exercise tolerated well No report of cardiac concerns or symptoms Strength training completed today  Goals Unmet:  Not Applicable  Comments: Pt able to follow exercise prescription today without complaint.  Will continue to monitor for progression.   [place] Name 01/04/20 1536 05/04/20 0734       6 Minute Walk   Phase Initial Discharge    Distance 1392 feet 1450 feet    Distance % Change -- 4 %    Distance Feet Change -- 58 ft    Walk Time 6 minutes 6 minutes    # of Rest Breaks 0 0    MPH 2.63 2.75    METS 2.25 2.23    RPE 11 11    Perceived Dyspnea  1 1    VO2 Peak 7.79 7.81    Symptoms No No     Resting HR 62 bpm 62 bpm    Resting BP 116/64 94/60    Resting Oxygen Saturation  95 % 95 %    Exercise Oxygen Saturation  during 6 min walk 85 % 90 %    Max Ex. HR 98 bpm 94 bpm    Max Ex. BP 156/70 150/70    2 Minute Post BP 124/64 128/70         Interval HR   1 Minute HR 78 75    2 Minute HR 91 78    3 Minute HR 94 89    4 Minute HR 95 90    5 Minute HR 95 92    6 Minute HR 98 94    2 Minute Post HR 71 68    Interval Heart Rate? Yes Yes         Interval Oxygen   Interval Oxygen? Yes Yes    Baseline Oxygen Saturation % 95 % 95 %    1 Minute Oxygen Saturation % 91 % 95 %    1 Minute  Liters of Oxygen 0 L  RA 0 L    2 Minute Oxygen Saturation % 88 % 93 %    2 Minute Liters of Oxygen 0 L 0 L    3 Minute Oxygen Saturation % 86 % 92 %    3 Minute Liters of Oxygen 0 L 0 L    4 Minute Oxygen Saturation % 85 % 90 %    4 Minute Liters of Oxygen 0 L 10 L    5 Minute Oxygen Saturation % 86 % 90 %    5 Minute Liters of Oxygen 0 L 0 L    6 Minute Oxygen Saturation % 86 % 91 %    6 Minute Liters of Oxygen 0 L 0 L    2 Minute Post Oxygen Saturation % 94 % 93 %    2 Minute Post Liters of Oxygen 0 L 0 L              Dr. Emily Filbert is Medical Director for [place] and [facility].

## 2020-05-05 ENCOUNTER — Ambulatory Visit
Admission: RE | Admit: 2020-05-05 | Discharge: 2020-05-05 | Disposition: A | Discharge status: Home / Self Care | Payer: Medicare Other | Source: Ambulatory Visit | Attending: Radiation Oncology | Admitting: Radiation Oncology

## 2020-05-05 DIAGNOSIS — C329 Malignant neoplasm of larynx, unspecified: Secondary | ICD-10-CM | POA: Diagnosis present

## 2020-05-05 DIAGNOSIS — Z510 Encounter for antineoplastic radiation therapy: Secondary | ICD-10-CM | POA: Insufficient documentation

## 2020-05-08 ENCOUNTER — Ambulatory Visit
Admission: RE | Admit: 2020-05-08 | Discharge: 2020-05-08 | Disposition: A | Discharge status: Home / Self Care | Payer: Medicare Other | Source: Ambulatory Visit | Attending: Radiation Oncology | Admitting: Radiation Oncology

## 2020-05-08 DIAGNOSIS — Z510 Encounter for antineoplastic radiation therapy: Secondary | ICD-10-CM | POA: Diagnosis not present

## 2020-05-09 ENCOUNTER — Ambulatory Visit
Admission: RE | Admit: 2020-05-09 | Discharge: 2020-05-09 | Disposition: A | Discharge status: Home / Self Care | Payer: Medicare Other | Source: Ambulatory Visit | Attending: Radiation Oncology | Admitting: Radiation Oncology

## 2020-05-09 DIAGNOSIS — Z510 Encounter for antineoplastic radiation therapy: Secondary | ICD-10-CM | POA: Diagnosis not present

## 2020-05-10 ENCOUNTER — Inpatient Hospital Stay: Payer: Medicare Other | Attending: Oncology | Admitting: Hospice and Palliative Medicine

## 2020-05-10 ENCOUNTER — Ambulatory Visit
Admission: RE | Admit: 2020-05-10 | Discharge: 2020-05-10 | Disposition: A | Discharge status: Home / Self Care | Payer: Medicare Other | Source: Ambulatory Visit | Attending: Radiation Oncology | Admitting: Radiation Oncology

## 2020-05-10 DIAGNOSIS — C329 Malignant neoplasm of larynx, unspecified: Secondary | ICD-10-CM | POA: Insufficient documentation

## 2020-05-10 DIAGNOSIS — Z510 Encounter for antineoplastic radiation therapy: Secondary | ICD-10-CM | POA: Diagnosis not present

## 2020-05-10 NOTE — Progress Notes (Signed)
Multidisciplinary Oncology Council Documentation  Nicholas Douglas was presented by our [facility] on 05/10/2020, which included representatives from:  . Palliative Care . Dietitian  . Physical/Occupational Therapist . Nurse Navigator . Genetics . Speech Therapist . Social work . Survivorship RN . Hotel manager . Research RN . [place] currently presents with history of stage I SCC of H&N  We reviewed previous medical and familial history, history of present illness, and recent lab results along with all available histopathologic and imaging studies. [facility] considered available treatment options and made the following recommendations/referrals:  Nutrition and rehab screening with consideration for SLP  The MOC is a meeting of clinicians from various specialty areas who evaluate and discuss patients for whom a multidisciplinary approach is being considered. Final determinations in the plan of care are those of the provider(s).   Today's extended care, comprehensive team conference, [name] was not present for the discussion and was not examined.

## 2020-05-11 ENCOUNTER — Ambulatory Visit
Admission: RE | Admit: 2020-05-11 | Discharge: 2020-05-11 | Disposition: A | Discharge status: Home / Self Care | Payer: Medicare Other | Source: Ambulatory Visit | Attending: Radiation Oncology | Admitting: Radiation Oncology

## 2020-05-11 ENCOUNTER — Encounter: Payer: Medicare Other | Attending: Pulmonary Disease

## 2020-05-11 ENCOUNTER — Other Ambulatory Visit: Payer: Self-pay

## 2020-05-11 DIAGNOSIS — J984 Other disorders of lung: Secondary | ICD-10-CM | POA: Insufficient documentation

## 2020-05-11 DIAGNOSIS — Z510 Encounter for antineoplastic radiation therapy: Secondary | ICD-10-CM | POA: Diagnosis not present

## 2020-05-11 NOTE — Progress Notes (Signed)
Daily Session Note  Patient Details  Name: Nicholas Douglas MRN: [id number] Date of Birth: 02-Oct-1931 Referring Provider:   Flowsheet Row Pulmonary Rehab from 01/04/2020 in [facility] Cardiac and Pulmonary Rehab  Referring Provider [name] MD      Encounter Date: 05/11/2020  Check In:  Session Check In - 05/11/20 0716      Check-In   Supervising physician immediately available to respond to emergencies See telemetry face sheet for immediately available ER MD    Location [facility]-Cardiac & Pulmonary Rehab    Staff Present Birdie Sons, MPA, [place] Area, BS, ACSM CEP, Exercise Physiologist;Amanda Oletta Darter, BA, ACSM CEP, Exercise Physiologist    Virtual Visit No    Medication changes reported     No    Fall or balance concerns reported    No    Tobacco Cessation No Change    Warm-up and Cool-down Performed on first and last piece of equipment    Resistance Training Performed Yes    VAD Patient? No    PAD/SET Patient? No      Pain Assessment   Currently in Pain? No/denies              Social History   Tobacco Use  Smoking Status Former Smoker  . Packs/day: 0.25  . Years: 20.00  . Pack years: 5.00  . Types: Cigars, Pipe  Smokeless Tobacco Former Systems developer  . Types: Chew  Tobacco Comment   Smokes about 2 cigars two months     Goals Met:  Independence with exercise equipment Exercise tolerated well No report of cardiac concerns or symptoms Strength training completed today  Goals Unmet:  Not Applicable  Comments: Pt able to follow exercise prescription today without complaint.  Will continue to monitor for progression.    Dr. Emily Filbert is Medical Director for [place] and [facility].

## 2020-05-12 ENCOUNTER — Encounter: Payer: Self-pay | Admitting: *Deleted

## 2020-05-12 ENCOUNTER — Ambulatory Visit
Admission: RE | Admit: 2020-05-12 | Discharge: 2020-05-12 | Disposition: A | Discharge status: Home / Self Care | Payer: Medicare Other | Source: Ambulatory Visit | Attending: Radiation Oncology | Admitting: Radiation Oncology

## 2020-05-12 DIAGNOSIS — J984 Other disorders of lung: Secondary | ICD-10-CM

## 2020-05-12 DIAGNOSIS — Z510 Encounter for antineoplastic radiation therapy: Secondary | ICD-10-CM | POA: Diagnosis not present

## 2020-05-12 NOTE — Patient Instructions (Signed)
Discharge Patient Instructions  Patient Details  Name: Nicholas Douglas MRN: [id number] Date of Birth: [birth year] Referring Provider:  No ref. provider found   Number of Visits: 32  Reason for Discharge:  Patient reached a stable level of exercise. Patient independent in their exercise. Patient has met program and personal goals.  Smoking History:  Social History   Tobacco Use  Smoking Status Former Smoker  . Packs/day: 0.25  . Years: 20.00  . Pack years: 5.00  . Types: Cigars, Pipe  Smokeless Tobacco Former Systems developer  . Types: Chew  Tobacco Comment   Smokes about 2 cigars two months     Diagnosis:  Restrictive lung disease  Initial Exercise Prescription:  Initial Exercise Prescription - 01/04/20 1500      Date of Initial Exercise RX and Referring Provider   Date 01/04/20    Referring Provider [name] MD      Treadmill   MPH 2.2    Grade 0    Minutes 15    METs 2.68      NuStep   Level 2    SPM 80    Minutes 15    METs 2.2      REL-XR   Level 2    Speed 50    Minutes 15    METs 2.2      Prescription Details   Frequency (times per week) 2    Duration Progress to 30 minutes of continuous aerobic without signs/symptoms of physical distress      Intensity   THRR 40-80% of Max Heartrate 90-119    Ratings of Perceived Exertion 11-13    Perceived Dyspnea 0-4      Progression   Progression Continue to progress workloads to maintain intensity without signs/symptoms of physical distress.      Resistance Training   Training Prescription Yes    Weight 3 lb    Reps 10-15           Discharge Exercise Prescription (Final Exercise Prescription Changes):  Exercise Prescription Changes - 05/09/20 1600      Response to Exercise   Blood Pressure (Admit) 94/60    Blood Pressure (Exercise) 150/70    Blood Pressure (Exit) 112/62    Heart Rate (Admit) 77 bpm    Heart Rate (Exercise) 93 bpm    Heart Rate (Exit) 82 bpm    Oxygen Saturation (Admit) 95 %     Oxygen Saturation (Exercise) 89 %    Oxygen Saturation (Exit) 94 %    Rating of Perceived Exertion (Exercise) 11    Perceived Dyspnea (Exercise) 1    Symptoms none    Duration Continue with 30 min of aerobic exercise without signs/symptoms of physical distress.    Intensity THRR unchanged      Progression   Progression Continue to progress workloads to maintain intensity without signs/symptoms of physical distress.    Average METs 3.87      Resistance Training   Training Prescription Yes    Weight 5 lb    Reps 10-15      Interval Training   Interval Training No      Treadmill   MPH 3    Grade 2    Minutes 15    METs 4.12      Recumbant Bike   Level 3    Minutes 15    METs 3.7      NuStep   Level 4    Minutes 15    METs  3.8      Home Exercise Plan   Plans to continue exercise at Home (comment)   walking on treadmill   Frequency Add 2 additional days to program exercise sessions.    Initial Home Exercises Provided 01/25/20           Functional Capacity:  6 Minute Walk    Row Name 01/04/20 1536 05/04/20 0734       6 Minute Walk   Phase Initial Discharge    Distance 1392 feet 1450 feet    Distance % Change -- 4 %    Distance Feet Change -- 58 ft    Walk Time 6 minutes 6 minutes    # of Rest Breaks 0 0    MPH 2.63 2.75    METS 2.25 2.23    RPE 11 11    Perceived Dyspnea  1 1    VO2 Peak 7.79 7.81    Symptoms No No    Resting HR 62 bpm 62 bpm    Resting BP 116/64 94/60    Resting Oxygen Saturation  95 % 95 %    Exercise Oxygen Saturation  during 6 min walk 85 % 90 %    Max Ex. HR 98 bpm 94 bpm    Max Ex. BP 156/70 150/70    2 Minute Post BP 124/64 128/70         Interval HR   1 Minute HR 78 75    2 Minute HR 91 78    3 Minute HR 94 89    4 Minute HR 95 90    5 Minute HR 95 92    6 Minute HR 98 94    2 Minute Post HR 71 68    Interval Heart Rate? Yes Yes         Interval Oxygen   Interval Oxygen? Yes Yes    Baseline Oxygen Saturation % 95  % 95 %    1 Minute Oxygen Saturation % 91 % 95 %    1 Minute Liters of Oxygen 0 L  RA 0 L    2 Minute Oxygen Saturation % 88 % 93 %    2 Minute Liters of Oxygen 0 L 0 L    3 Minute Oxygen Saturation % 86 % 92 %    3 Minute Liters of Oxygen 0 L 0 L    4 Minute Oxygen Saturation % 85 % 90 %    4 Minute Liters of Oxygen 0 L 10 L    5 Minute Oxygen Saturation % 86 % 90 %    5 Minute Liters of Oxygen 0 L 0 L    6 Minute Oxygen Saturation % 86 % 91 %    6 Minute Liters of Oxygen 0 L 0 L    2 Minute Post Oxygen Saturation % 94 % 93 %    2 Minute Post Liters of Oxygen 0 L 0 L           Quality of Life:   Personal Goals: Goals established at orientation with interventions provided to work toward goal.  Personal Goals and Risk Factors at Admission - 01/04/20 1547      Core Components/Risk Factors/Patient Goals on Admission    Weight Management Yes;Weight Loss    Intervention Weight Management: Provide education and appropriate resources to help participant work on and attain dietary goals.;Weight Management: Develop a combined nutrition and exercise program designed to reach desired caloric intake, while maintaining  appropriate intake of nutrient and fiber, sodium and fats, and appropriate energy expenditure required for the weight goal.;Weight Management/Obesity: Establish reasonable short term and long term weight goals.    Admit Weight 192 lb (87.1 kg)    Goal Weight: Short Term 187 lb (84.8 kg)    Goal Weight: Long Term 182 lb (82.6 kg)    Expected Outcomes Short Term: Continue to assess and modify interventions until short term weight is achieved;Long Term: Adherence to nutrition and physical activity/exercise program aimed toward attainment of established weight goal;Weight Loss: Understanding of general recommendations for a balanced deficit meal plan, which promotes 1-2 lb weight loss per week and includes a negative energy balance of [phone] kcal/d;Understanding recommendations for  meals to include 15-35% energy as protein, 25-35% energy from fat, 35-60% energy from carbohydrates, less than 239m of dietary cholesterol, 20-35 gm of total fiber daily;Understanding of distribution of calorie intake throughout the day with the consumption of 4-5 meals/snacks    Tobacco Cessation Yes    Number of packs per day Smokes cigars- socially    Intervention Assist the participant in steps to quit. Provide individualized education and counseling about committing to Tobacco Cessation, relapse prevention, and pharmacological support that can be provided by physician.;OAdvice worker assist with locating and accessing local/national Quit Smoking programs, and support quit date choice.    Expected Outcomes Short Term: Will demonstrate readiness to quit, by selecting a quit date.;Short Term: Will quit all tobacco product use, adhering to prevention of relapse plan.;Long Term: Complete abstinence from all tobacco products for at least 12 months from quit date.    Improve shortness of breath with ADL's Yes    Intervention Provide education, individualized exercise plan and daily activity instruction to help decrease symptoms of SOB with activities of daily living.    Expected Outcomes Short Term: Improve cardiorespiratory fitness to achieve a reduction of symptoms when performing ADLs;Long Term: Be able to perform more ADLs without symptoms or delay the onset of symptoms    Hypertension Yes    Intervention Provide education on lifestyle modifcations including regular physical activity/exercise, weight management, moderate sodium restriction and increased consumption of fresh fruit, vegetables, and low fat dairy, alcohol moderation, and smoking cessation.;Monitor prescription use compliance.    Expected Outcomes Short Term: Continued assessment and intervention until BP is < 140/938mHG in hypertensive participants. < 130/8072mG in hypertensive participants with diabetes, heart failure or  chronic kidney disease.;Long Term: Maintenance of blood pressure at goal levels.    Lipids Yes    Intervention Provide education and support for participant on nutrition & aerobic/resistive exercise along with prescribed medications to achieve LDL <68m74mDL >40mg37m Expected Outcomes Short Term: Participant states understanding of desired cholesterol values and is compliant with medications prescribed. Participant is following exercise prescription and nutrition guidelines.;Long Term: Cholesterol controlled with medications as prescribed, with individualized exercise RX and with personalized nutrition plan. Value goals: LDL < 68mg,60m > 40 mg.            Personal Goals Discharge:  Goals and Risk Factor Review - 05/02/20 0833      Core Components/Risk Factors/Patient Goals Review   Personal Goals Review Improve shortness of breath with ADL's;Weight Management/Obesity;Hypertension    Review Jim isClair Gullinging well in rehab.  His weight is fairly steady. He is starting radiation this week.  His pressures continue to do well in class and at home.  Breathing continues to be a struggle but he is  working through it.    Expected Outcomes Short: Start treatment Long: Maintain through treatment           Exercise Goals and Review:  Exercise Goals    Row Name 01/04/20 1545             Exercise Goals   Increase Physical Activity Yes       Intervention Provide advice, education, support and counseling about physical activity/exercise needs.;Develop an individualized exercise prescription for aerobic and resistive training based on initial evaluation findings, risk stratification, comorbidities and participant's personal goals.       Expected Outcomes Short Term: Attend rehab on a regular basis to increase amount of physical activity.;Long Term: Add in home exercise to make exercise part of routine and to increase amount of physical activity.;Long Term: Exercising regularly at least 3-5 days a week.        Increase Strength and Stamina Yes       Intervention Provide advice, education, support and counseling about physical activity/exercise needs.;Develop an individualized exercise prescription for aerobic and resistive training based on initial evaluation findings, risk stratification, comorbidities and participant's personal goals.       Expected Outcomes Short Term: Increase workloads from initial exercise prescription for resistance, speed, and METs.;Short Term: Perform resistance training exercises routinely during rehab and add in resistance training at home;Long Term: Improve cardiorespiratory fitness, muscular endurance and strength as measured by increased METs and functional capacity (6MWT)       Able to understand and use rate of perceived exertion (RPE) scale Yes       Intervention Provide education and explanation on how to use RPE scale       Expected Outcomes Short Term: Able to use RPE daily in rehab to express subjective intensity level;Long Term:  Able to use RPE to guide intensity level when exercising independently       Able to understand and use Dyspnea scale Yes       Intervention Provide education and explanation on how to use Dyspnea scale       Expected Outcomes Short Term: Able to use Dyspnea scale daily in rehab to express subjective sense of shortness of breath during exertion;Long Term: Able to use Dyspnea scale to guide intensity level when exercising independently       Knowledge and understanding of Target Heart Rate Range (THRR) Yes       Intervention Provide education and explanation of THRR including how the numbers were predicted and where they are located for reference       Expected Outcomes Short Term: Able to state/look up THRR;Short Term: Able to use daily as guideline for intensity in rehab;Long Term: Able to use THRR to govern intensity when exercising independently       Able to check pulse independently Yes       Intervention Provide education and  demonstration on how to check pulse in carotid and radial arteries.;Review the importance of being able to check your own pulse for safety during independent exercise       Expected Outcomes Short Term: Able to explain why pulse checking is important during independent exercise;Long Term: Able to check pulse independently and accurately       Understanding of Exercise Prescription Yes       Intervention Provide education, explanation, and written materials on patient's individual exercise prescription       Expected Outcomes Short Term: Able to explain program exercise prescription;Long Term: Able to explain home exercise prescription  to exercise independently              Exercise Goals Re-Evaluation:  Exercise Goals Re-Evaluation    Row Name 01/06/20 0749 01/19/20 0951 01/25/20 0925 02/01/20 1429 02/11/20 0736     Exercise Goal Re-Evaluation   Exercise Goals Review Increase Physical Activity;Able to understand and use rate of perceived exertion (RPE) scale;Knowledge and understanding of Target Heart Rate Range (THRR);Understanding of Exercise Prescription;Increase Strength and Stamina;Able to understand and use Dyspnea scale;Able to check pulse independently Increase Strength and Stamina;Increase Physical Activity;Understanding of Exercise Prescription Increase Strength and Stamina;Increase Physical Activity;Understanding of Exercise Prescription Increase Physical Activity;Increase Strength and Stamina Knowledge and understanding of Target Heart Rate Range (THRR)   Comments Reviewed RPE and dyspnea scales, THR and program prescription with pt today.  Pt voiced understanding and was given a copy of goals to take home. Clair Gulling is off to a good start in rehab.  He has already gotten to 5.2 METs on the XR.  We will continue to monitor his prigress. Reviewed home exercise with pt today.  Pt plans to walk for exercise.  Patient golfs 3x/week but does not really do continuous aerobic exercise. Reviewed THR,  pulse, RPE, sign and symptoms, pulse oximetery and when to call 911 or MD.  Also discussed weather considerations and indoor options.  Pt voiced understanding. Clair Gulling had increased TM to 2.7 and 4.5 % grade.  His oxygen dropped to 86% so staff had him rest and coninue at 2.5 and 1 % which kept oxygen 88% and above.  Staff reviewed importance of maintaining O2 above 88%. Johnston states that he plays golf 3 days a week and has been trying to do more exercise. Informed him of his THRR and why he needs to keep within range. Patient verbalizes understanding.   Expected Outcomes Short: Use RPE daily to regulate intensity. Long: Follow program prescription in THR. Short: Continue to attend rehab regularly Long: Continue to follow program prescription Short: Add 1-2 days of exercise at home Long: Exercise independently at home with no complications Short: keep oxygen above 88% at all times when exercising Long: improve stamina overall Short: when working out at home check HR. Long: check HR independently.   [place] Name 02/28/20 1407 03/13/20 0917 03/27/20 1708 04/12/20 0857 04/20/20 0726     Exercise Goal Re-Evaluation   Exercise Goals Review Increase Physical Activity;Increase Strength and Stamina Increase Physical Activity;Increase Strength and Stamina;Understanding of Exercise Prescription Increase Physical Activity;Increase Strength and Stamina Increase Physical Activity;Increase Strength and Stamina;Understanding of Exercise Prescription Increase Physical Activity;Increase Strength and Stamina;Understanding of Exercise Prescription   Comments Clair Gulling has increased levels on TM He doesnt quite reach THR range.  He attends consistently.  Staff will monitor progress. Clair Gulling has continued to do well in rehab.  He is now up to 3.2 mph on the tredamill.  We will continue to monitor his porgress. Jim gradually increases speed on TM after warm up.  He has moved to 6 lb weights for strength work. Clair Gulling is doing well in rehab.  He is now  on level 5 for the NuStep.  We will continue to monitor his progress. Clair Gulling is doing well in rehab. He is using his treadmill on his off days and feeling pretty good with it.  He is using 8 lb weights and plays golf too.   Expected Outcomes Short: work towards THR range Long: improve stamina Short: Make sure maintianing spm to keep HR up Long; Continue to improve stamina. Short: continue  to exercise consistently at [facility] and home Long: increase overall MET level Short: Add incline to treadmill Long: Continue to improve stamina. Short: continue to exercise on off days Long: Continue to improve stamina.   [place] Name 04/24/20 1753 04/24/20 1754 05/02/20 0825 05/09/20 1606       Exercise Goal Re-Evaluation   Exercise Goals Review Increase Physical Activity;Increase Strength and Stamina Increase Physical Activity;Increase Strength and Stamina Increase Physical Activity;Increase Strength and Stamina;Understanding of Exercise Prescription Increase Physical Activity;Increase Strength and Stamina;Understanding of Exercise Prescription    Comments -- Clair Gulling continues to progress well with exercise.  he increases TM speed and grade as he goes through the 20 min. Clair Gulling is doing well in rehab. He walks and plays golf on his off days.  He starts radiation this week and is going to try his best to finish up his last few sessions.  We will do his walk test on Thursday before treatments start. Clair Gulling is nearing graduation. He improved his walk test by 72f.  He is out this week as he gets used to his radiation treatments side effects.  We will continue to work with him through this process.    Expected Outcomes -- Short:maintain consistent exercise Long:  buls overall stamina Short: Improve post 6MWT Long: Continue to exercise Short: Attend as able to work towards graduation Long: Continue to exercise independently           Nutrition & Weight - Outcomes:  Pre Biometrics - 01/04/20 1546      Pre Biometrics   Height 5' 7.5" (1.715  m)    Weight 192 lb 11.2 oz (87.4 kg)    BMI (Calculated) 29.72    Single Leg Stand 4.94 seconds            Nutrition:  Nutrition Therapy & Goals - 02/07/20 1340      Nutrition Therapy   Diet Heart healthy, low Na    Protein (specify units) 70g    Fiber 30 grams    Whole Grain Foods 3 servings    Saturated Fats 12 max. grams    Fruits and Vegetables 8 servings/day    Sodium 1.5 grams      Personal Nutrition Goals   Nutrition Goal jim would not like to make any changes at this time.    Comments B: bacon and eggs or oatmeal D: pintos, potatoes, vegetables, 1 slice of bread, chicken, salmon, or steak once in a while. No salt added and uses olive oil (he will salt).  S: canned peaches. He has been maintaining weight. His wife cooks for him. He rpeorts not wanting to make any changes at this time. Discussed heart healthy eating,      Intervention Plan   Intervention Prescribe, educate and counsel regarding individualized specific dietary modifications aiming towards targeted core components such as weight, hypertension, lipid management, diabetes, heart failure and other comorbidities.;Nutrition handout(s) given to patient.    Expected Outcomes Short Term Goal: Understand basic principles of dietary content, such as calories, fat, sodium, cholesterol and nutrients.;Short Term Goal: A plan has been developed with personal nutrition goals set during dietitian appointment.;Long Term Goal: Adherence to prescribed nutrition plan.           Nutrition Discharge:   Education Questionnaire Score:  Knowledge Questionnaire Score - 01/04/20 1548      Knowledge Questionnaire Score   Pre Score 14/18: Oxygen, Medications           Goals reviewed with patient; copy given  to patient.

## 2020-05-12 NOTE — Progress Notes (Signed)
Discharge Progress Report  Patient Details  Name: Nicholas Douglas MRN: [id number] Date of Birth: Jan 24, 1932 Referring Provider:   Flowsheet Row Pulmonary Rehab from 01/04/2020 in [facility]  Referring Provider [name] MD     Number of Visits: 32  Reason for Discharge:  Patient reached a stable level of exercise. Patient independent in their exercise. Patient has met program and personal goals.  Smoking History:  Social History   Tobacco Use  Smoking Status Former Smoker  . Packs/day: 0.25  . Years: 20.00  . Pack years: 5.00  . Types: Cigars, Pipe  Smokeless Tobacco Former Systems developer  . Types: Chew  Tobacco Comment   Smokes about 2 cigars two months    Diagnosis:  Restrictive lung disease ADL UCSD:  Pulmonary Assessment Scores    Row Name 01/04/20 1549         ADL UCSD   ADL Phase Entry     SOB Score total 30     Rest 0     Walk 2     Stairs 3     Bath 2     Dress 0     Shop 2           CAT Score   CAT Score 15           mMRC Score   mMRC Score 1           Initial Exercise Prescription:  Initial Exercise Prescription - 01/04/20 1500      Date of Initial Exercise RX and Referring Provider   Date 01/04/20    Referring Provider [name] MD      Treadmill   MPH 2.2    Grade 0    Minutes 15    METs 2.68      NuStep   Level 2    SPM 80    Minutes 15    METs 2.2      REL-XR   Level 2    Speed 50    Minutes 15    METs 2.2      Prescription Details   Frequency (times per week) 2    Duration Progress to 30 minutes of continuous aerobic without signs/symptoms of physical distress      Intensity   THRR 40-80% of Max Heartrate 90-119    Ratings of Perceived Exertion 11-13    Perceived Dyspnea 0-4      Progression   Progression Continue to progress workloads to maintain intensity without signs/symptoms of physical distress.      Resistance Training   Training Prescription Yes    Weight 3 lb    Reps 10-15           Discharge Exercise Prescription (Final Exercise Prescription Changes):  Exercise Prescription Changes - 05/09/20 1600      Response to Exercise   Blood Pressure (Admit) 94/60    Blood Pressure (Exercise) 150/70    Blood Pressure (Exit) 112/62    Heart Rate (Admit) 77 bpm    Heart Rate (Exercise) 93 bpm    Heart Rate (Exit) 82 bpm    Oxygen Saturation (Admit) 95 %    Oxygen Saturation (Exercise) 89 %    Oxygen Saturation (Exit) 94 %    Rating of Perceived Exertion (Exercise) 11    Perceived Dyspnea (Exercise) 1    Symptoms none    Duration Continue with 30 min of aerobic exercise without signs/symptoms of physical distress.  Intensity THRR unchanged      Progression   Progression Continue to progress workloads to maintain intensity without signs/symptoms of physical distress.    Average METs 3.87      Resistance Training   Training Prescription Yes    Weight 5 lb    Reps 10-15      Interval Training   Interval Training No      Treadmill   MPH 3    Grade 2    Minutes 15    METs 4.12      Recumbant Bike   Level 3    Minutes 15    METs 3.7      NuStep   Level 4    Minutes 15    METs 3.8      Home Exercise Plan   Plans to continue exercise at Home (comment)   walking on treadmill   Frequency Add 2 additional days to program exercise sessions.    Initial Home Exercises Provided 01/25/20          Functional Capacity:  6 Minute Walk    Row Name 01/04/20 1536 05/04/20 0734       6 Minute Walk   Phase Initial Discharge    Distance 1392 feet 1450 feet    Distance % Change -- 4 %    Distance Feet Change -- 58 ft    Walk Time 6 minutes 6 minutes    # of Rest Breaks 0 0    MPH 2.63 2.75    METS 2.25 2.23    RPE 11 11    Perceived Dyspnea  1 1    VO2 Peak 7.79 7.81    Symptoms No No    Resting HR 62 bpm 62 bpm    Resting BP 116/64 94/60    Resting Oxygen Saturation  95 % 95 %    Exercise Oxygen Saturation  during 6 min walk 85 % 90 %    Max  Ex. HR 98 bpm 94 bpm    Max Ex. BP 156/70 150/70    2 Minute Post BP 124/64 128/70         Interval HR   1 Minute HR 78 75    2 Minute HR 91 78    3 Minute HR 94 89    4 Minute HR 95 90    5 Minute HR 95 92    6 Minute HR 98 94    2 Minute Post HR 71 68    Interval Heart Rate? Yes Yes         Interval Oxygen   Interval Oxygen? Yes Yes    Baseline Oxygen Saturation % 95 % 95 %    1 Minute Oxygen Saturation % 91 % 95 %    1 Minute Liters of Oxygen 0 L  RA 0 L    2 Minute Oxygen Saturation % 88 % 93 %    2 Minute Liters of Oxygen 0 L 0 L    3 Minute Oxygen Saturation % 86 % 92 %    3 Minute Liters of Oxygen 0 L 0 L    4 Minute Oxygen Saturation % 85 % 90 %    4 Minute Liters of Oxygen 0 L 10 L    5 Minute Oxygen Saturation % 86 % 90 %    5 Minute Liters of Oxygen 0 L 0 L    6 Minute Oxygen Saturation % 86 % 91 %  6 Minute Liters of Oxygen 0 L 0 L    2 Minute Post Oxygen Saturation % 94 % 93 %    2 Minute Post Liters of Oxygen 0 L 0 L          Psychological, QOL, Others - Outcomes: PHQ 2/9: Depression screen [facility] 2/9 01/04/2020 04/20/2015 12/22/2013  Decreased Interest 0 0 0  Down, Depressed, Hopeless 0 0 0  PHQ - 2 Score 0 0 0  Altered sleeping 0 - -  Tired, decreased energy 2 - -  Change in appetite 0 - -  Feeling bad or failure about yourself  0 - -  Trouble concentrating 0 - -  Moving slowly or fidgety/restless 0 - -  Suicidal thoughts 0 - -  PHQ-9 Score 2 - -  Difficult doing work/chores Not difficult at all - -  Some recent data might be hidden   Nutrition & Weight - Outcomes:  Pre Biometrics - 01/04/20 1546      Pre Biometrics   Height 5' 7.5" (1.715 m)    Weight 192 lb 11.2 oz (87.4 kg)    BMI (Calculated) 29.72    Single Leg Stand 4.94 seconds          Nutrition:  Nutrition Therapy & Goals - 02/07/20 1340      Nutrition Therapy   Diet Heart healthy, low Na    Protein (specify units) 70g    Fiber 30 grams    Whole Grain Foods 3 servings     Saturated Fats 12 max. grams    Fruits and Vegetables 8 servings/day    Sodium 1.5 grams      Personal Nutrition Goals   Nutrition Goal jim would not like to make any changes at this time.    Comments B: bacon and eggs or oatmeal D: pintos, potatoes, vegetables, 1 slice of bread, chicken, salmon, or steak once in a while. No salt added and uses olive oil (he will salt).  S: canned peaches. He has been maintaining weight. His wife cooks for him. He rpeorts not wanting to make any changes at this time. Discussed heart healthy eating,      Intervention Plan   Intervention Prescribe, educate and counsel regarding individualized specific dietary modifications aiming towards targeted core components such as weight, hypertension, lipid management, diabetes, heart failure and other comorbidities.;Nutrition handout(s) given to patient.    Expected Outcomes Short Term Goal: Understand basic principles of dietary content, such as calories, fat, sodium, cholesterol and nutrients.;Short Term Goal: A plan has been developed with personal nutrition goals set during dietitian appointment.;Long Term Goal: Adherence to prescribed nutrition plan.          Education Questionnaire Score:  Knowledge Questionnaire Score - 01/04/20 1548      Knowledge Questionnaire Score   Pre Score 14/18: Oxygen, Medications          Goals reviewed with patient; copy given to patient.

## 2020-05-12 NOTE — Progress Notes (Signed)
Pulmonary Individual Treatment Plan  Patient Details  Name: Nicholas Douglas MRN: [id number] Date of Birth: 09/28/1931 Referring Provider:   Flowsheet Row Pulmonary Rehab from 01/04/2020 in [facility] Cardiac and Pulmonary Rehab  Referring Provider [name] MD      Initial Encounter Date:  Flowsheet Row Pulmonary Rehab from 01/04/2020 in [facility] Cardiac and Pulmonary Rehab  Date 01/04/20      Visit Diagnosis: Restrictive lung disease  Patient's Home Medications on Admission:  Current Outpatient Medications:  .  acetaminophen (TYLENOL) 500 MG tablet, Take 500 mg by mouth every 6 (six) hours as needed (for pain.). , Disp: , Rfl:  .  amLODipine (NORVASC) 10 MG tablet, Take 1 tablet (10 mg total) by mouth every evening., Disp: 90 tablet, Rfl: 3 .  aspirin 81 MG tablet, Take 81 mg by mouth every evening., Disp: , Rfl:  .  atenolol (TENORMIN) 25 MG tablet, TAKE ONE-HALF (1/2) TABLET  BY MOUTH TWICE A DAY (Patient taking differently: Take 12.5 mg by mouth 2 (two) times daily.), Disp: 90 tablet, Rfl: 3 .  Carboxymethylcell-Hypromellose (GENTEAL OP), Apply 1 drop to eye 2 (two) times daily., Disp: , Rfl:  .  clopidogrel (PLAVIX) 75 MG tablet, Take 1 tablet (75 mg total) by mouth daily., Disp: 90 tablet, Rfl: 3 .  ezetimibe (ZETIA) 10 MG tablet, TAKE 1 TABLET BY MOUTH  DAILY (Patient taking differently: Take 10 mg by mouth daily.), Disp: 90 tablet, Rfl: 3 .  finasteride (PROSCAR) 5 MG tablet, Take 1 tablet (5 mg total) by mouth daily. (Patient taking differently: Take 2.5 mg by mouth daily.), Disp: 90 tablet, Rfl: 3 .  isosorbide mononitrate (IMDUR) 60 MG 24 hr tablet, TAKE 1 TABLET BY MOUTH  DAILY (Patient taking differently: Take 60 mg by mouth daily.), Disp: 90 tablet, Rfl: 3 .  nitroGLYCERIN (NITROSTAT) 0.4 MG SL tablet, PLACE 1 TABLET (0.4 MG TOTAL) UNDER THE TONGUE EVERY 5 (FIVE) MINUTES AS NEEDED FOR CHEST PAIN. (Patient not taking: No sig reported), Disp: 25 tablet, Rfl: 11 .  Pitavastatin  Calcium (LIVALO) 4 MG TABS, Take 1 tablet (4 mg total) by mouth every evening., Disp: 90 tablet, Rfl: 3 .  spironolactone (ALDACTONE) 25 MG tablet, Take 0.5 tablets (12.5 mg total) by mouth daily., Disp: 45 tablet, Rfl: 3 .  traZODone (DESYREL) 50 MG tablet, Take 50 mg by mouth at bedtime as needed for sleep., Disp: , Rfl:  .  VASCEPA 1 g capsule, TAKE 1 CAPSULE BY MOUTH  TWICE DAILY (Patient taking differently: Take 1 g by mouth 2 (two) times daily.), Disp: 180 capsule, Rfl: 3  Past Medical History: Past Medical History:  Diagnosis Date  . BPH (benign prostatic hyperplasia)    Followed by [name]/urology  . CAD S/P PTCA only of RPAV-PL 05/31/2015   99% --> 0%PAV - PTCA only (unable to advance STENT) due to RCA calcification - prox & distal RCA 30%; Patent LAD stent & Cx stent (~20% ISR).   . Calculus of kidney    "that's how they found the cancer" (04/27/2013)  . Coronary artery disease involving native coronary artery of native heart with unstable angina pectoris ([place]) 12/03/2011   S/P Cardiac angioplasty 1986, 1991, 1993, 1997.  Last cardiac catheterization 2001.  Cardiolite 05/2011 low risk with normal EF 65%.  Followed by cardiology/[name] every six months.   . Dyspnea    only on exertion  . History of myocardial infarction 1976  . Hypertension   . Impotence of organic  origin   . Internal hemorrhoids without mention of complication   . Malignant neoplasm of kidney, except pelvis   . Melanoma ([place]) 06/2010   Left arm  . Melanoma of back ([place]) 02/07/2015   R upper back; excision [facility].  . Myocardial infarction ([place])   . Other abnormal blood chemistry   . Other nonspecific abnormal serum enzyme levels   . Personal history of colonic polyps   . Pure hypercholesterolemia   . Tobacco use disorder   . Unspecified congenital cystic kidney disease   . Unstable angina ([place]) 04/05/2013; 05/2015    Tobacco Use: Social History   Tobacco Use  Smoking Status Former Smoker  .  Packs/day: 0.25  . Years: 20.00  . Pack years: 5.00  . Types: Cigars, Pipe  Smokeless Tobacco Former Systems developer  . Types: Chew  Tobacco Comment   Smokes about 2 cigars two months     Labs: Recent Review Flowsheet Data    Labs for [facility]P Cardiac and Pulmonary Rehab Latest Ref Rng & Units 11/07/2017 11/18/2017 05/25/2018 05/13/2019 11/23/2019   Cholestrol 100 - 199 mg/dL 148 124 146 132 152   LDLCALC 0 - 99 mg/dL 66 57 72 62 78   HDL >39 mg/dL 57 51 52 49 52   Trlycerides 0 - 149 mg/dL 124 82 109 116 122   Hemoglobin A1c <5.7 % - - - - -       Pulmonary Assessment Scores:  Pulmonary Assessment Scores    Row Name 01/04/20 1549         ADL UCSD   ADL Phase Entry     SOB Score total 30     Rest 0     Walk 2     Stairs 3     Bath 2     Dress 0     Shop 2           CAT Score   CAT Score 15           mMRC Score   mMRC Score 1            UCSD: Self-administered rating of dyspnea associated with activities of daily living (ADLs) 6-point scale (0 = "not at all" to 5 = "maximal or unable to do because of breathlessness")  Scoring Scores range from 0 to 120.  Minimally important difference is 5 units  CAT: CAT can identify the health impairment of COPD patients and is better correlated with disease progression.  CAT has a scoring range of zero to 40. The CAT score is classified into four groups of low (less than 10), medium (10 - 20), high (21-30) and very high (31-40) based on the impact level of disease on health status. A CAT score over 10 suggests significant symptoms.  A worsening CAT score could be explained by an exacerbation, poor medication adherence, poor inhaler technique, or progression of COPD or comorbid conditions.  CAT MCID is 2 points  mMRC: mMRC (Modified Medical Research Council) Dyspnea Scale is used to assess the degree of baseline functional disability in patients of respiratory disease due to dyspnea. No minimal important difference is established. A decrease  in score of 1 point or greater is considered a positive change.   Pulmonary Function Assessment:  Pulmonary Function Assessment - 12/23/19 0901      Breath   Shortness of Breath No           Exercise Target Goals: Exercise Program Goal: Individual exercise prescription set  using results from initial 6 min walk test and THRR while considering  patient's activity barriers and safety.   Exercise Prescription Goal: Initial exercise prescription builds to 30-45 minutes a day of aerobic activity, 2-3 days per week.  Home exercise guidelines will be given to patient during program as part of exercise prescription that the participant will acknowledge.  Education: Aerobic Exercise: - Group verbal and visual presentation on the components of exercise prescription. Introduces F.I.T.T principle from ACSM for exercise prescriptions.  Reviews F.I.T.T. principles of aerobic exercise including progression. Written material given at graduation.   Education: Resistance Exercise: - Group verbal and visual presentation on the components of exercise prescription. Introduces F.I.T.T principle from ACSM for exercise prescriptions  Reviews F.I.T.T. principles of resistance exercise including progression. Written material given at graduation.    Education: Exercise & Equipment Safety: - Individual verbal instruction and demonstration of equipment use and safety with use of the equipment. Flowsheet Row Pulmonary Rehab from 02/24/2020 in [facility] Cardiac and Pulmonary Rehab  Date 12/23/19  Educator [facility]  Instruction Review Code 1- Verbalizes Understanding      Education: Exercise Physiology & General Exercise Guidelines: - Group verbal and written instruction with models to review the exercise physiology of the cardiovascular system and associated critical values. Provides general exercise guidelines with specific guidelines to those with heart or lung disease.    Education: Flexibility, Balance, Mind/Body  Relaxation: - Group verbal and visual presentation with interactive activity on the components of exercise prescription. Introduces F.I.T.T principle from ACSM for exercise prescriptions. Reviews F.I.T.T. principles of flexibility and balance exercise training including progression. Also discusses the mind body connection.  Reviews various relaxation techniques to help reduce and manage stress (i.e. Deep breathing, progressive muscle relaxation, and visualization). Balance handout provided to take home. Written material given at graduation.   Activity Barriers & Risk Stratification:  Activity Barriers & Cardiac Risk Stratification - 01/04/20 1546      Activity Barriers & Cardiac Risk Stratification   Activity Barriers Shortness of Breath;Joint Problems;Deconditioning           6 Minute Walk:  6 Minute Walk    Row Name 01/04/20 1536 05/04/20 0734       6 Minute Walk   Phase Initial Discharge    Distance 1392 feet 1450 feet    Distance % Change -- 4 %    Distance Feet Change -- 58 ft    Walk Time 6 minutes 6 minutes    # of Rest Breaks 0 0    MPH 2.63 2.75    METS 2.25 2.23    RPE 11 11    Perceived Dyspnea  1 1    VO2 Peak 7.79 7.81    Symptoms No No    Resting HR 62 bpm 62 bpm    Resting BP 116/64 94/60    Resting Oxygen Saturation  95 % 95 %    Exercise Oxygen Saturation  during 6 min walk 85 % 90 %    Max Ex. HR 98 bpm 94 bpm    Max Ex. BP 156/70 150/70    2 Minute Post BP 124/64 128/70         Interval HR   1 Minute HR 78 75    2 Minute HR 91 78    3 Minute HR 94 89    4 Minute HR 95 90    5 Minute HR 95 92    6 Minute HR 98 94  2 Minute Post HR 71 68    Interval Heart Rate? Yes Yes         Interval Oxygen   Interval Oxygen? Yes Yes    Baseline Oxygen Saturation % 95 % 95 %    1 Minute Oxygen Saturation % 91 % 95 %    1 Minute Liters of Oxygen 0 L  RA 0 L    2 Minute Oxygen Saturation % 88 % 93 %    2 Minute Liters of Oxygen 0 L 0 L    3 Minute Oxygen  Saturation % 86 % 92 %    3 Minute Liters of Oxygen 0 L 0 L    4 Minute Oxygen Saturation % 85 % 90 %    4 Minute Liters of Oxygen 0 L 10 L    5 Minute Oxygen Saturation % 86 % 90 %    5 Minute Liters of Oxygen 0 L 0 L    6 Minute Oxygen Saturation % 86 % 91 %    6 Minute Liters of Oxygen 0 L 0 L    2 Minute Post Oxygen Saturation % 94 % 93 %    2 Minute Post Liters of Oxygen 0 L 0 L          Oxygen Initial Assessment:  Oxygen Initial Assessment - 01/04/20 1549      Home Oxygen   Home Oxygen Device None    Sleep Oxygen Prescription None    Home Exercise Oxygen Prescription None    Home Resting Oxygen Prescription None    Compliance with Home Oxygen Use Yes      Initial 6 min Walk   Oxygen Used None      Program Oxygen Prescription   Program Oxygen Prescription None      Intervention   Short Term Goals To learn and understand importance of monitoring SPO2 with pulse oximeter and demonstrate accurate use of the pulse oximeter.;To learn and understand importance of maintaining oxygen saturations>88%;To learn and demonstrate proper pursed lip breathing techniques or other breathing techniques.;To learn and demonstrate proper use of respiratory medications    Long  Term Goals Maintenance of O2 saturations>88%;Verbalizes importance of monitoring SPO2 with pulse oximeter and return demonstration;Exhibits proper breathing techniques, such as pursed lip breathing or other method taught during program session;Demonstrates proper use of MDI's;Compliance with respiratory medication           Oxygen Re-Evaluation:  Oxygen Re-Evaluation    Row Name 01/06/20 0749 02/11/20 0723 03/14/20 0740 04/20/20 0733 05/02/20 0834     Program Oxygen Prescription   Program Oxygen Prescription None None None None None     Home Oxygen   Home Oxygen Device None None None None None   Sleep Oxygen Prescription None None None None None   Home Exercise Oxygen Prescription None None None None None    Home Resting Oxygen Prescription None None None None None   Compliance with Home Oxygen Use Yes Yes Yes Yes --     Goals/Expected Outcomes   Short Term Goals To learn and understand importance of monitoring SPO2 with pulse oximeter and demonstrate accurate use of the pulse oximeter.;To learn and understand importance of maintaining oxygen saturations>88%;To learn and demonstrate proper pursed lip breathing techniques or other breathing techniques. To learn and understand importance of maintaining oxygen saturations>88%;To learn and demonstrate proper pursed lip breathing techniques or other breathing techniques. To learn and understand importance of maintaining oxygen saturations>88%;To learn and demonstrate proper pursed  lip breathing techniques or other breathing techniques. To learn and understand importance of maintaining oxygen saturations>88%;To learn and demonstrate proper pursed lip breathing techniques or other breathing techniques.;To learn and understand importance of monitoring SPO2 with pulse oximeter and demonstrate accurate use of the pulse oximeter. To learn and understand importance of maintaining oxygen saturations>88%;To learn and demonstrate proper pursed lip breathing techniques or other breathing techniques.;To learn and understand importance of monitoring SPO2 with pulse oximeter and demonstrate accurate use of the pulse oximeter.   Long  Term Goals Maintenance of O2 saturations>88%;Verbalizes importance of monitoring SPO2 with pulse oximeter and return demonstration;Exhibits proper breathing techniques, such as pursed lip breathing or other method taught during program session Maintenance of O2 saturations>88%;Verbalizes importance of monitoring SPO2 with pulse oximeter and return demonstration Maintenance of O2 saturations>88%;Verbalizes importance of monitoring SPO2 with pulse oximeter and return demonstration Maintenance of O2 saturations>88%;Verbalizes importance of monitoring  SPO2 with pulse oximeter and return demonstration;Exhibits proper breathing techniques, such as pursed lip breathing or other method taught during program session Maintenance of O2 saturations>88%;Verbalizes importance of monitoring SPO2 with pulse oximeter and return demonstration;Exhibits proper breathing techniques, such as pursed lip breathing or other method taught during program session   Comments Reviewed PLB technique with pt.  Talked about how it works and it's importance in maintaining their exercise saturations. Donelle has been trying to use PLB when he can. He knows that it is difficult to do but why it is important. Informed him that he needs to be 88 percent and above. He bought a pulse oximeter last week and knows how and when to use it. Clair Gulling hasn't been using PLB due to hoarseness in his throat.  He sees Dr for this tomorrow. Clair Gulling has not noticed much difference in his breathing.  He is using PLB on occasion.  He is currently getting work up for tumor.  We will continue to monitor his breathing. Clair Gulling is still struggling with his breathing.  He does use his PLB and we will continue to encourage him to use it.   Goals/Expected Outcomes Short: Become more profiecient at using PLB.   Long: Become independent at using PLB. Short: get more familiare with pulse oximeter and PLB. Long: use pulse oximeter and PLB independently Short: follow up with Dr Laverta [place]:  get back to using PLB Short: Continue to work on tumor work up Long: Continue to use PLB Short: Continue to work on tumor treatment Long: Continue to use PLB          Oxygen Discharge (Final Oxygen Re-Evaluation):  Oxygen Re-Evaluation - 05/02/20 0834      Program Oxygen Prescription   Program Oxygen Prescription None      Home Oxygen   Home Oxygen Device None    Sleep Oxygen Prescription None    Home Exercise Oxygen Prescription None    Home Resting Oxygen Prescription None      Goals/Expected Outcomes   Short Term Goals To learn and  understand importance of maintaining oxygen saturations>88%;To learn and demonstrate proper pursed lip breathing techniques or other breathing techniques.;To learn and understand importance of monitoring SPO2 with pulse oximeter and demonstrate accurate use of the pulse oximeter.    Long  Term Goals Maintenance of O2 saturations>88%;Verbalizes importance of monitoring SPO2 with pulse oximeter and return demonstration;Exhibits proper breathing techniques, such as pursed lip breathing or other method taught during program session    Comments Clair Gulling is still struggling with his breathing.  He does use his PLB and  we will continue to encourage him to use it.    Goals/Expected Outcomes Short: Continue to work on tumor treatment Long: Continue to use PLB           Initial Exercise Prescription:  Initial Exercise Prescription - 01/04/20 1500      Date of Initial Exercise RX and Referring Provider   Date 01/04/20    Referring Provider [name] MD      Treadmill   MPH 2.2    Grade 0    Minutes 15    METs 2.68      NuStep   Level 2    SPM 80    Minutes 15    METs 2.2      REL-XR   Level 2    Speed 50    Minutes 15    METs 2.2      Prescription Details   Frequency (times per week) 2    Duration Progress to 30 minutes of continuous aerobic without signs/symptoms of physical distress      Intensity   THRR 40-80% of Max Heartrate 90-119    Ratings of Perceived Exertion 11-13    Perceived Dyspnea 0-4      Progression   Progression Continue to progress workloads to maintain intensity without signs/symptoms of physical distress.      Resistance Training   Training Prescription Yes    Weight 3 lb    Reps 10-15           Perform Capillary Blood Glucose checks as needed.  Exercise Prescription Changes:   Exercise Prescription Changes    Row Name 01/04/20 1500 01/19/20 0900 02/01/20 1400 02/14/20 0900 02/28/20 1400     Response to Exercise   Blood Pressure (Admit)  116/64 124/66 104/58 122/64 118/60   Blood Pressure (Exercise) 156/70 116/66 148/70 130/70 132/68   Blood Pressure (Exit) 124/64 106/60 102/64 122/64 108/62   Heart Rate (Admit) 62 bpm 65 bpm 62 bpm 66 bpm 61 bpm   Heart Rate (Exercise) 98 bpm 99 bpm 99 bpm 90 bpm 86 bpm   Heart Rate (Exit) 62 bpm 74 bpm 74 bpm 71 bpm 75 bpm   Oxygen Saturation (Admit) 95 % 95 % 95 % 94 % 93 %   Oxygen Saturation (Exercise) 85 % 89 % 88 % 93 % 91 %   Oxygen Saturation (Exit) 95 % 95 % 89 % 97 % 93 %   Rating of Perceived Exertion (Exercise) '11 14 11 13 13   ' Perceived Dyspnea (Exercise) 1 1 0 1 0   Symptoms none none none none none   Comments walk test results -- -- -- --   Duration -- Progress to 30 minutes of  aerobic without signs/symptoms of physical distress Continue with 30 min of aerobic exercise without signs/symptoms of physical distress. Continue with 30 min of aerobic exercise without signs/symptoms of physical distress. Continue with 30 min of aerobic exercise without signs/symptoms of physical distress.   Intensity -- THRR unchanged THRR unchanged THRR unchanged THRR unchanged     Progression   Progression -- Continue to progress workloads to maintain intensity without signs/symptoms of physical distress. Continue to progress workloads to maintain intensity without signs/symptoms of physical distress. Continue to progress workloads to maintain intensity without signs/symptoms of physical distress. Continue to progress workloads to maintain intensity without signs/symptoms of physical distress.   Average METs -- 3.82 3.14 3.3 3.3     Resistance Training   Training Prescription -- Yes Yes  Yes Yes   Weight -- 3 lb 5 lb 5 lb 5 lb   Reps -- 10-15 10-15 10-15 10-15     Interval Training   Interval Training -- No No No No     Treadmill   MPH -- 2.2 2.5 -- 2.7   Grade -- 0 1 -- 1.5   Minutes -- 15 15 -- 15   METs -- 2.68 3.26 -- 3.63     Recumbant Bike   Level -- -- -- 1 --   Minutes -- -- --  15 --     NuStep   Level -- '2 3 4 4   ' SPM -- -- 80 -- 80   Minutes -- '15 15 15 15   ' METs -- 3.6 3 3.6 2.9     REL-XR   Level -- 4 -- -- --   Minutes -- 15 -- -- --   METs -- 5.2 -- -- --     Biostep-RELP   Level -- -- -- 3 --   Minutes -- -- -- 15 --   METs -- -- -- 3 --     Home Exercise Plan   Plans to continue exercise at -- -- -- Home (comment)  walking on treadmill --   Frequency -- -- -- Add 2 additional days to program exercise sessions. --   Initial Home Exercises Provided -- -- -- 01/25/20 --   [place] Name 03/13/20 0900 03/27/20 1700 04/12/20 0800 04/24/20 1700 05/09/20 1600     Response to Exercise   Blood Pressure (Admit) 108/68 122/64 114/66 130/80 94/60   Blood Pressure (Exercise) 128/72 162/68 144/70 146/80 150/70   Blood Pressure (Exit) 130/66 116/68 118/62 120/58 112/62   Heart Rate (Admit) 59 bpm 55 bpm 58 bpm 61 bpm 77 bpm   Heart Rate (Exercise) 82 bpm 77 bpm 100 bpm 100 bpm 93 bpm   Heart Rate (Exit) 65 bpm 78 bpm 73 bpm 69 bpm 82 bpm   Oxygen Saturation (Admit) 91 % 93 % 93 % 92 % 95 %   Oxygen Saturation (Exercise) 89 % 91 % 87 % 89 % 89 %   Oxygen Saturation (Exit) 93 % 94 % 95 % 93 % 94 %   Rating of Perceived Exertion (Exercise) '12 12 12 12 11   ' Perceived Dyspnea (Exercise) '1 1 2 2 1   ' Symptoms none none none none none   Duration Continue with 30 min of aerobic exercise without signs/symptoms of physical distress. Continue with 30 min of aerobic exercise without signs/symptoms of physical distress. Continue with 30 min of aerobic exercise without signs/symptoms of physical distress. Continue with 30 min of aerobic exercise without signs/symptoms of physical distress. Continue with 30 min of aerobic exercise without signs/symptoms of physical distress.   Intensity THRR unchanged THRR unchanged THRR unchanged THRR unchanged THRR unchanged     Progression   Progression Continue to progress workloads to maintain intensity without signs/symptoms of physical  distress. Continue to progress workloads to maintain intensity without signs/symptoms of physical distress. Continue to progress workloads to maintain intensity without signs/symptoms of physical distress. Continue to progress workloads to maintain intensity without signs/symptoms of physical distress. Continue to progress workloads to maintain intensity without signs/symptoms of physical distress.   Average METs 3.94 3.5 3.8 4.1 3.87     Resistance Training   Training Prescription Yes Yes Yes Yes Yes   Weight 5 lb 6 lb 6 lb 6 lb 5 lb   Reps 10-15 10-15  10-15 10-15 10-15     Interval Training   Interval Training No No No -- No     Treadmill   MPH 3.2 3.'2 3 3 3   ' Grade 2 1.5 0 2 2   Minutes '15 15 15 15 15   ' METs 4.11 3.92 3.3 4.12 4.12     Recumbant Bike   Level -- -- 3.6 -- 3   Minutes -- -- 15 -- 15   METs -- -- 4.6 -- 3.7     NuStep   Level '5 5 5 5 4   ' SPM -- 80 -- 80 --   Minutes '15 15 15 15 15   ' METs 3.7 3.1 3.5 4.1 3.8     Biostep-RELP   Level 4 -- -- -- --   Minutes 15 -- -- -- --   METs 4 -- -- -- --     Home Exercise Plan   Plans to continue exercise at Home (comment)  walking on treadmill -- Home (comment)  walking on treadmill Home (comment)  walking on treadmill Home (comment)  walking on treadmill   Frequency Add 2 additional days to program exercise sessions. -- Add 2 additional days to program exercise sessions. Add 2 additional days to program exercise sessions. Add 2 additional days to program exercise sessions.   Initial Home Exercises Provided 01/25/20 -- 01/25/20 01/25/20 01/25/20          Exercise Comments:   Exercise Comments    Row Name 01/06/20 [id number]           Exercise Comments First full day of exercise!  Patient was oriented to gym and equipment including functions, settings, policies, and procedures.  Patient's individual exercise prescription and treatment plan were reviewed.  All starting workloads were established based on the results of  the 6 minute walk test done at initial orientation visit.  The plan for exercise progression was also introduced and progression will be customized based on patient's performance and goals.              Exercise Goals and Review:   Exercise Goals    Row Name 01/04/20 1545             Exercise Goals   Increase Physical Activity Yes       Intervention Provide advice, education, support and counseling about physical activity/exercise needs.;Develop an individualized exercise prescription for aerobic and resistive training based on initial evaluation findings, risk stratification, comorbidities and participant's personal goals.       Expected Outcomes Short Term: Attend rehab on a regular basis to increase amount of physical activity.;Long Term: Add in home exercise to make exercise part of routine and to increase amount of physical activity.;Long Term: Exercising regularly at least 3-5 days a week.       Increase Strength and Stamina Yes       Intervention Provide advice, education, support and counseling about physical activity/exercise needs.;Develop an individualized exercise prescription for aerobic and resistive training based on initial evaluation findings, risk stratification, comorbidities and participant's personal goals.       Expected Outcomes Short Term: Increase workloads from initial exercise prescription for resistance, speed, and METs.;Short Term: Perform resistance training exercises routinely during rehab and add in resistance training at home;Long Term: Improve cardiorespiratory fitness, muscular endurance and strength as measured by increased METs and functional capacity (6MWT)       Able to understand and use rate of perceived exertion (RPE) scale Yes  Intervention Provide education and explanation on how to use RPE scale       Expected Outcomes Short Term: Able to use RPE daily in rehab to express subjective intensity level;Long Term:  Able to use RPE to guide  intensity level when exercising independently       Able to understand and use Dyspnea scale Yes       Intervention Provide education and explanation on how to use Dyspnea scale       Expected Outcomes Short Term: Able to use Dyspnea scale daily in rehab to express subjective sense of shortness of breath during exertion;Long Term: Able to use Dyspnea scale to guide intensity level when exercising independently       Knowledge and understanding of Target Heart Rate Range (THRR) Yes       Intervention Provide education and explanation of THRR including how the numbers were predicted and where they are located for reference       Expected Outcomes Short Term: Able to state/look up THRR;Short Term: Able to use daily as guideline for intensity in rehab;Long Term: Able to use THRR to govern intensity when exercising independently       Able to check pulse independently Yes       Intervention Provide education and demonstration on how to check pulse in carotid and radial arteries.;Review the importance of being able to check your own pulse for safety during independent exercise       Expected Outcomes Short Term: Able to explain why pulse checking is important during independent exercise;Long Term: Able to check pulse independently and accurately       Understanding of Exercise Prescription Yes       Intervention Provide education, explanation, and written materials on patient's individual exercise prescription       Expected Outcomes Short Term: Able to explain program exercise prescription;Long Term: Able to explain home exercise prescription to exercise independently              Exercise Goals Re-Evaluation :  Exercise Goals Re-Evaluation    Row Name 01/06/20 0749 01/19/20 0951 01/25/20 0925 02/01/20 1429 02/11/20 0736     Exercise Goal Re-Evaluation   Exercise Goals Review Increase Physical Activity;Able to understand and use rate of perceived exertion (RPE) scale;Knowledge and understanding of  Target Heart Rate Range (THRR);Understanding of Exercise Prescription;Increase Strength and Stamina;Able to understand and use Dyspnea scale;Able to check pulse independently Increase Strength and Stamina;Increase Physical Activity;Understanding of Exercise Prescription Increase Strength and Stamina;Increase Physical Activity;Understanding of Exercise Prescription Increase Physical Activity;Increase Strength and Stamina Knowledge and understanding of Target Heart Rate Range (THRR)   Comments Reviewed RPE and dyspnea scales, THR and program prescription with pt today.  Pt voiced understanding and was given a copy of goals to take home. Clair Gulling is off to a good start in rehab.  He has already gotten to 5.2 METs on the XR.  We will continue to monitor his prigress. Reviewed home exercise with pt today.  Pt plans to walk for exercise.  Patient golfs 3x/week but does not really do continuous aerobic exercise. Reviewed THR, pulse, RPE, sign and symptoms, pulse oximetery and when to call 911 or MD.  Also discussed weather considerations and indoor options.  Pt voiced understanding. Clair Gulling had increased TM to 2.7 and 4.5 % grade.  His oxygen dropped to 86% so staff had him rest and coninue at 2.5 and 1 % which kept oxygen 88% and above.  Staff reviewed importance of maintaining O2  above 88%. Herby states that he plays golf 3 days a week and has been trying to do more exercise. Informed him of his THRR and why he needs to keep within range. Patient verbalizes understanding.   Expected Outcomes Short: Use RPE daily to regulate intensity. Long: Follow program prescription in THR. Short: Continue to attend rehab regularly Long: Continue to follow program prescription Short: Add 1-2 days of exercise at home Long: Exercise independently at home with no complications Short: keep oxygen above 88% at all times when exercising Long: improve stamina overall Short: when working out at home check HR. Long: check HR independently.   [place] Name  02/28/20 1407 03/13/20 0917 03/27/20 1708 04/12/20 0857 04/20/20 0726     Exercise Goal Re-Evaluation   Exercise Goals Review Increase Physical Activity;Increase Strength and Stamina Increase Physical Activity;Increase Strength and Stamina;Understanding of Exercise Prescription Increase Physical Activity;Increase Strength and Stamina Increase Physical Activity;Increase Strength and Stamina;Understanding of Exercise Prescription Increase Physical Activity;Increase Strength and Stamina;Understanding of Exercise Prescription   Comments Clair Gulling has increased levels on TM He doesnt quite reach THR range.  He attends consistently.  Staff will monitor progress. Clair Gulling has continued to do well in rehab.  He is now up to 3.2 mph on the tredamill.  We will continue to monitor his porgress. Jim gradually increases speed on TM after warm up.  He has moved to 6 lb weights for strength work. Clair Gulling is doing well in rehab.  He is now on level 5 for the NuStep.  We will continue to monitor his progress. Clair Gulling is doing well in rehab. He is using his treadmill on his off days and feeling pretty good with it.  He is using 8 lb weights and plays golf too.   Expected Outcomes Short: work towards THR range Long: improve stamina Short: Make sure maintianing spm to keep HR up Long; Continue to improve stamina. Short: continue to exercise consistently at [facility] and home Long: increase overall MET level Short: Add incline to treadmill Long: Continue to improve stamina. Short: continue to exercise on off days Long: Continue to improve stamina.   [place] Name 04/24/20 1753 04/24/20 1754 05/02/20 0825 05/09/20 1606       Exercise Goal Re-Evaluation   Exercise Goals Review Increase Physical Activity;Increase Strength and Stamina Increase Physical Activity;Increase Strength and Stamina Increase Physical Activity;Increase Strength and Stamina;Understanding of Exercise Prescription Increase Physical Activity;Increase Strength and Stamina;Understanding of  Exercise Prescription    Comments -- Clair Gulling continues to progress well with exercise.  he increases TM speed and grade as he goes through the 20 min. Clair Gulling is doing well in rehab. He walks and plays golf on his off days.  He starts radiation this week and is going to try his best to finish up his last few sessions.  We will do his walk test on Thursday before treatments start. Clair Gulling is nearing graduation. He improved his walk test by 16f.  He is out this week as he gets used to his radiation treatments side effects.  We will continue to work with him through this process.    Expected Outcomes -- Short:maintain consistent exercise Long:  buls overall stamina Short: Improve post 6MWT Long: Continue to exercise Short: Attend as able to work towards graduation Long: Continue to exercise independently           Discharge Exercise Prescription (Final Exercise Prescription Changes):  Exercise Prescription Changes - 05/09/20 1600      Response to Exercise   Blood  Pressure (Admit) 94/60    Blood Pressure (Exercise) 150/70    Blood Pressure (Exit) 112/62    Heart Rate (Admit) 77 bpm    Heart Rate (Exercise) 93 bpm    Heart Rate (Exit) 82 bpm    Oxygen Saturation (Admit) 95 %    Oxygen Saturation (Exercise) 89 %    Oxygen Saturation (Exit) 94 %    Rating of Perceived Exertion (Exercise) 11    Perceived Dyspnea (Exercise) 1    Symptoms none    Duration Continue with 30 min of aerobic exercise without signs/symptoms of physical distress.    Intensity THRR unchanged      Progression   Progression Continue to progress workloads to maintain intensity without signs/symptoms of physical distress.    Average METs 3.87      Resistance Training   Training Prescription Yes    Weight 5 lb    Reps 10-15      Interval Training   Interval Training No      Treadmill   MPH 3    Grade 2    Minutes 15    METs 4.12      Recumbant Bike   Level 3    Minutes 15    METs 3.7      NuStep   Level 4     Minutes 15    METs 3.8      Home Exercise Plan   Plans to continue exercise at Home (comment)   walking on treadmill   Frequency Add 2 additional days to program exercise sessions.    Initial Home Exercises Provided 01/25/20           Nutrition:  Target Goals: Understanding of nutrition guidelines, daily intake of sodium <1521m, cholesterol <2025m calories 30% from fat and 7% or less from saturated fats, daily to have 5 or more servings of fruits and vegetables.  Education: All About Nutrition: -Group instruction provided by verbal, written material, interactive activities, discussions, models, and posters to present general guidelines for heart healthy nutrition including fat, fiber, MyPlate, the role of sodium in heart healthy nutrition, utilization of the nutrition label, and utilization of this knowledge for meal planning. Follow up email sent as well. Written material given at graduation.   Biometrics:  Pre Biometrics - 01/04/20 1546      Pre Biometrics   Height 5' 7.5" (1.715 m)    Weight 192 lb 11.2 oz (87.4 kg)    BMI (Calculated) 29.72    Single Leg Stand 4.94 seconds            Nutrition Therapy Plan and Nutrition Goals:  Nutrition Therapy & Goals - 02/07/20 1340      Nutrition Therapy   Diet Heart healthy, low Na    Protein (specify units) 70g    Fiber 30 grams    Whole Grain Foods 3 servings    Saturated Fats 12 max. grams    Fruits and Vegetables 8 servings/day    Sodium 1.5 grams      Personal Nutrition Goals   Nutrition Goal jim would not like to make any changes at this time.    Comments B: bacon and eggs or oatmeal D: pintos, potatoes, vegetables, 1 slice of bread, chicken, salmon, or steak once in a while. No salt added and uses olive oil (he will salt).  S: canned peaches. He has been maintaining weight. His wife cooks for him. He rpeorts not wanting to make any changes at this time.  Discussed heart healthy eating,      Intervention Plan    Intervention Prescribe, educate and counsel regarding individualized specific dietary modifications aiming towards targeted core components such as weight, hypertension, lipid management, diabetes, heart failure and other comorbidities.;Nutrition handout(s) given to patient.    Expected Outcomes Short Term Goal: Understand basic principles of dietary content, such as calories, fat, sodium, cholesterol and nutrients.;Short Term Goal: A plan has been developed with personal nutrition goals set during dietitian appointment.;Long Term Goal: Adherence to prescribed nutrition plan.           Nutrition Assessments:  MEDIFICTS Score Key:  ?70 Need to make dietary changes   40-70 Heart Healthy Diet  ? 40 Therapeutic Level Cholesterol Diet  Flowsheet Row Pulmonary Rehab from 01/04/2020 in [facility] Cardiac and Pulmonary Rehab  Picture Your Plate Total Score on Admission 46     Picture Your Plate Scores:  <56 Unhealthy dietary pattern with much room for improvement.  41-50 Dietary pattern unlikely to meet recommendations for good health and room for improvement.  51-60 More healthful dietary pattern, with some room for improvement.   >60 Healthy dietary pattern, although there may be some specific behaviors that could be improved.   Nutrition Goals Re-Evaluation:  Nutrition Goals Re-Evaluation    Row Name 01/25/20 [id number] 03/14/20 0729 04/20/20 0729 05/02/20 0831       Goals   Nutrition Goal Patient has not completed their initial consultation with the RD at this time. Once scheduled, RD will establish nutrition goals with patient -- Watch portion sizes Watch portion sizes    Comment Patient has not had goals set up with RD at this time. Patient is really focusing on portion control and cutting out white bread. Clair Gulling is still watching portion sizes and reducing white bread. Clair Gulling is doing pretty well with his diet.  His wife tries to make sure he is eating good.  They eat a lot of beans and fruit.  He  is working on portion control. Clair Gulling contineus to work on his diet.  His wife does the cooking and makes sure he is eating well. He continues to work on protion sizes. He has been eating more soft foods recently with is tumor.    Expected Outcome Short: Meet with RD Long: Maintain overall healthy diet Short:  continue to be aware of portion sizes Long: maintain overall healthy diet Short: Continue to work on portion size Long: Continue to maintain healthy diet. Short: Continue to work on portion size Long: Continue to eat healthy.           Nutrition Goals Discharge (Final Nutrition Goals Re-Evaluation):  Nutrition Goals Re-Evaluation - 05/02/20 0831      Goals   Nutrition Goal Watch portion sizes    Comment Jim contineus to work on his diet.  His wife does the cooking and makes sure he is eating well. He continues to work on protion sizes. He has been eating more soft foods recently with is tumor.    Expected Outcome Short: Continue to work on portion size Long: Continue to eat healthy.           Psychosocial: Target Goals: Acknowledge presence or absence of significant depression and/or stress, maximize coping skills, provide positive support system. Participant is able to verbalize types and ability to use techniques and skills needed for reducing stress and depression.   Education: Stress, Anxiety, and Depression - Group verbal and visual presentation to define topics covered.  Reviews how body  is impacted by stress, anxiety, and depression.  Also discusses healthy ways to reduce stress and to treat/manage anxiety and depression.  Written material given at graduation.   Education: Sleep Hygiene -Provides group verbal and written instruction about how sleep can affect your health.  Define sleep hygiene, discuss sleep cycles and impact of sleep habits. Review good sleep hygiene tips.    Initial Review & Psychosocial Screening:  Initial Psych Review & Screening - 12/23/19 0904       Initial Review   Current issues with None Identified      Family Dynamics   Good Support System? Yes    Comments He can look to his wife for support and has no concerns at this time.      Barriers   Psychosocial barriers to participate in program There are no identifiable barriers or psychosocial needs.;The patient should benefit from training in stress management and relaxation.      Screening Interventions   Interventions Encouraged to exercise;To provide support and resources with identified psychosocial needs;Provide feedback about the scores to participant    Expected Outcomes Short Term goal: Utilizing psychosocial counselor, staff and physician to assist with identification of specific Stressors or current issues interfering with healing process. Setting desired goal for each stressor or current issue identified.;Long Term Goal: Stressors or current issues are controlled or eliminated.;Short Term goal: Identification and review with participant of any Quality of Life or Depression concerns found by scoring the questionnaire.;Long Term goal: The participant improves quality of Life and PHQ9 Scores as seen by post scores and/or verbalization of changes           Quality of Life Scores:  Scores of 19 and below usually indicate a poorer quality of life in these areas.  A difference of  2-3 points is a clinically meaningful difference.  A difference of 2-3 points in the total score of the Quality of Life Index has been associated with significant improvement in overall quality of life, self-image, physical symptoms, and general health in studies assessing change in quality of life.  PHQ-9: Recent Review Flowsheet Data    Depression screen [facility] 2/9 01/04/2020 04/20/2015 12/22/2013   Decreased Interest 0 0 0   Down, Depressed, Hopeless 0 0 0   PHQ - 2 Score 0 0 0   Altered sleeping 0 - -   Tired, decreased energy 2 - -   Change in appetite 0 - -   Feeling bad or failure about yourself  0  - -   Trouble concentrating 0 - -   Moving slowly or fidgety/restless 0 - -   Suicidal thoughts 0 - -   PHQ-9 Score 2 - -   Difficult doing work/chores Not difficult at all - -     Interpretation of Total Score  Total Score Depression Severity:  1-4 = Minimal depression, 5-9 = Mild depression, 10-14 = Moderate depression, 15-19 = Moderately severe depression, 20-27 = Severe depression   Psychosocial Evaluation and Intervention:  Psychosocial Evaluation - 12/23/19 0905      Psychosocial Evaluation & Interventions   Interventions Relaxation education;Stress management education;Encouraged to exercise with the program and follow exercise prescription    Comments He can look to his wife for support and has no concerns at this time.    Expected Outcomes Short: Exercise regularly to support mental health and notify staff of any changes. Long: maintain mental health and well being through teaching of rehab or prescribed medications independently.  Continue Psychosocial Services  Follow up required by staff           Psychosocial Re-Evaluation:  Psychosocial Re-Evaluation    Row Name 01/25/20 830 026 0521 02/11/20 0734 03/14/20 0728 04/20/20 0730 05/02/20 0827     Psychosocial Re-Evaluation   Current issues with None Identified None Identified -- Current Stress Concerns Current Stress Concerns   Comments Clair Gulling is doing well mentally. Patient denies any concerns at this time regarding sleep, stress, depression. He states his number of hours of sleep is sometimes limited, however, does not effect hm during the day. He has great support from his wife who looks out for him. He stays social by going golfing 3x/week. Clair Gulling states that he has no issues at this time. His wife says that he is the most laid back person she knows. He states that the Reita Cliche keeps him going and 90 percent of the time he feels great. Clair Gulling sleeps well and has no stress concerns at this time. 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  He is handling the best he can.  He had surgery two weeks ago and get PET scan results tomorrow.  The results were looking favorable and plans for treatment. He will keep [place] posted and continue to lean on his golf buddies to help. 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  He is coping well and has friends and family that are very supportive and praying over him.  He is doing his best to stay positive through all of this.   Expected Outcomes Short: Continue attending Pulmonary Rehab Long: Maintain positive attitude with continuous exercise Short: Continue to exercise regularly to support mental health and notify staff of any changes. Long: maintain mental health and well being through teaching of rehab or prescribed medications independently. Short:  continue to exercise regularly Long:  maintain positive outlook Short: Continue to stay positive about tumor Long: Continue to focus on positive. Short: Get radiation treatment Long: Focus on the positive.   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  He is coping well and has friends and family that are very supportive and praying over him.  He is doing his best to stay positive through all of this.    Expected Outcomes Short: Get radiation treatment Long: Focus on the positive.     Interventions Encouraged to attend Pulmonary Rehabilitation for the exercise           Education: Education Goals: Education classes will be provided on a weekly basis, covering required topics. Participant will state understanding/return demonstration of topics presented.  Learning Barriers/Preferences:  Learning Barriers/Preferences - 12/23/19 4081      Learning Barriers/Preferences   Learning Barriers None    Learning Preferences None           General Pulmonary Education Topics:  Infection Prevention: - Provides  verbal and written material to individual with discussion of infection control including proper hand washing and proper equipment cleaning during exercise session. Flowsheet Row Pulmonary Rehab from 02/24/2020 in [facility] Cardiac and Pulmonary Rehab  Date 12/23/19  Educator [facility]  Instruction Review Code 1- Verbalizes Understanding      Falls Prevention: - Provides verbal and written material to individual with discussion of falls prevention and safety. Flowsheet Row Pulmonary Rehab from 02/24/2020 in [facility] Cardiac and Pulmonary Rehab  Date 12/23/19  Educator [facility]  Instruction Review Code 1- Verbalizes Understanding      Chronic Lung Disease Review: - Group verbal instruction with posters, models, PowerPoint presentations and videos,  to review new updates, new respiratory medications, new advancements in procedures and treatments. Providing information on websites and "800" numbers for continued self-education. Includes information about supplement oxygen, available portable oxygen systems, continuous and intermittent flow rates, oxygen safety, concentrators, and Medicare reimbursement for oxygen. Explanation of Pulmonary Drugs, including class, frequency, complications, importance of spacers, rinsing mouth after steroid MDI's, and proper cleaning methods for nebulizers. Review of basic lung anatomy and physiology related to function, structure, and complications of lung  disease. Review of risk factors. Discussion about methods for diagnosing sleep apnea and types of masks and machines for OSA. Includes a review of the use of types of environmental controls: home humidity, furnaces, filters, dust mite/pet prevention, HEPA vacuums. Discussion about weather changes, air quality and the benefits of nasal washing. Instruction on Warning signs, infection symptoms, calling MD promptly, preventive modes, and value of vaccinations. Review of effective airway clearance, coughing and/or vibration techniques. Emphasizing that all should Create an Action Plan. Written material given at graduation. Flowsheet Row Pulmonary Rehab from 02/24/2020 in [facility] Cardiac and Pulmonary Rehab  Date 01/06/20  Educator jh  Instruction Review Code 1- Verbalizes Understanding      AED/CPR: - Group verbal and written instruction with the use of models to demonstrate the basic use of the AED with the basic ABC's of resuscitation.    Anatomy and Cardiac Procedures: - Group verbal and visual presentation and models provide information about basic cardiac anatomy and function. Reviews the testing methods done to diagnose heart disease and the outcomes of the test results. Describes the treatment choices: Medical Management, Angioplasty, or Coronary Bypass Surgery for treating various heart conditions including Myocardial Infarction, Angina, Valve Disease, and Cardiac Arrhythmias.  Written material given at graduation.   Medication Safety: - Group verbal and visual instruction to review commonly prescribed medications for heart and lung disease. Reviews the medication, class of the drug, and side effects. Includes the steps to properly store meds and maintain the prescription regimen.  Written material given at graduation.   Other: -Provides group and verbal instruction on various topics (see comments)   Knowledge Questionnaire Score:  Knowledge Questionnaire Score - 01/04/20 1548       Knowledge Questionnaire Score   Pre Score 14/18: Oxygen, Medications            Core Components/Risk Factors/Patient Goals at Admission:  Personal Goals and Risk Factors at Admission - 01/04/20 1547      Core Components/Risk Factors/Patient Goals on Admission    Weight Management Yes;Weight Loss    Intervention Weight Management: Provide education and appropriate resources to help participant work on and attain dietary goals.;Weight Management: Develop a combined nutrition and exercise program designed to reach desired caloric intake, while maintaining appropriate intake of nutrient and fiber, sodium and fats, and appropriate energy expenditure required for the  weight goal.;Weight Management/Obesity: Establish reasonable short term and long term weight goals.    Admit Weight 192 lb (87.1 kg)    Goal Weight: Short Term 187 lb (84.8 kg)    Goal Weight: Long Term 182 lb (82.6 kg)    Expected Outcomes Short Term: Continue to assess and modify interventions until short term weight is achieved;Long Term: Adherence to nutrition and physical activity/exercise program aimed toward attainment of established weight goal;Weight Loss: Understanding of general recommendations for a balanced deficit meal plan, which promotes 1-2 lb weight loss per week and includes a negative energy balance of [id number] kcal/d;Understanding recommendations for meals to include 15-35% energy as protein, 25-35% energy from fat, 35-60% energy from carbohydrates, less than 236m of dietary cholesterol, 20-35 gm of total fiber daily;Understanding of distribution of calorie intake throughout the day with the consumption of 4-5 meals/snacks    Tobacco Cessation Yes    Number of packs per day Smokes cigars- socially    Intervention Assist the participant in steps to quit. Provide individualized education and counseling about committing to Tobacco Cessation, relapse prevention, and pharmacological support that can be provided by  physician.;OAdvice worker assist with locating and accessing local/national Quit Smoking programs, and support quit date choice.    Expected Outcomes Short Term: Will demonstrate readiness to quit, by selecting a quit date.;Short Term: Will quit all tobacco product use, adhering to prevention of relapse plan.;Long Term: Complete abstinence from all tobacco products for at least 12 months from quit date.    Improve shortness of breath with ADL's Yes    Intervention Provide education, individualized exercise plan and daily activity instruction to help decrease symptoms of SOB with activities of daily living.    Expected Outcomes Short Term: Improve cardiorespiratory fitness to achieve a reduction of symptoms when performing ADLs;Long Term: Be able to perform more ADLs without symptoms or delay the onset of symptoms    Hypertension Yes    Intervention Provide education on lifestyle modifcations including regular physical activity/exercise, weight management, moderate sodium restriction and increased consumption of fresh fruit, vegetables, and low fat dairy, alcohol moderation, and smoking cessation.;Monitor prescription use compliance.    Expected Outcomes Short Term: Continued assessment and intervention until BP is < 140/96mHG in hypertensive participants. < 130/8014mG in hypertensive participants with diabetes, heart failure or chronic kidney disease.;Long Term: Maintenance of blood pressure at goal levels.    Lipids Yes    Intervention Provide education and support for participant on nutrition & aerobic/resistive exercise along with prescribed medications to achieve LDL <23m20mDL >40mg75m Expected Outcomes Short Term: Participant states understanding of desired cholesterol values and is compliant with medications prescribed. Participant is following exercise prescription and nutrition guidelines.;Long Term: Cholesterol controlled with medications as prescribed, with individualized  exercise RX and with personalized nutrition plan. Value goals: LDL < 23mg,55m > 40 mg.           Education:Diabetes - Individual verbal and written instruction to review signs/symptoms of diabetes, desired ranges of glucose level fasting, after meals and with exercise. Acknowledge that pre and post exercise glucose checks will be done for 3 sessions at entry of program.   Know Your Numbers and Heart Failure: - Group verbal and visual instruction to discuss disease risk factors for cardiac and pulmonary disease and treatment options.  Reviews associated critical values for Overweight/Obesity, Hypertension, Cholesterol, and Diabetes.  Discusses basics of heart failure: signs/symptoms and treatments.  Introduces Heart Failure Zone chart for  action plan for heart failure.  Written material given at graduation.   Core Components/Risk Factors/Patient Goals Review:   Goals and Risk Factor Review    Row Name 01/25/20 0739 02/11/20 0731 03/14/20 0725 04/20/20 0727 05/02/20 4235     Core Components/Risk Factors/Patient Goals Review   Personal Goals Review Weight Management/Obesity;Tobacco Cessation;Improve shortness of breath with ADL's;Lipids;Hypertension Improve shortness of breath with ADL's Improve shortness of breath with ADL's Improve shortness of breath with ADL's;Weight Management/Obesity;Hypertension Improve shortness of breath with ADL's;Weight Management/Obesity;Hypertension   Review Clair Gulling is doing well. His SOB has immproved slighly. He really tries to practice using PLB when needed.  Does not have desire to quit cigars at this time as he smokes them socially while he golfs. BP is stable at home and at rehab- checks it couples times/ week, typically runs around 120-130s/70s. Right now, he thinks he gained 1-2 lbs due to extra food with the holidays. He is currently 192 lb and his goal is to get down to 180 lb. He is focusing on small servings and maintain appropriate portion sizes. Wife focuses  on healthy diet, cutting out white bread to help. Spoke to patient about their shortness of breath and what they can do to improve. Patient has been informed of breathing techniques when starting the program. Patient is informed to tell staff if they have had any med changes and that certain meds they are taking or not taking can be causing shortness of breath. Clair Gulling states he is taking medications as directed.  He has not noticed a significant improvement in shortness of breath.  He sees Dr tomorrow about hoarseness/ clearing his throat.  He hasnt been using PLB due to this. Jim's weight goes up and down depending on how he is eating.  Blood pressures have been pretty good for him at home and in class.  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 Clair Gulling is doing well in rehab.  His weight is fairly steady. He is starting radiation this week.  His pressures continue to do well in class and at home.  Breathing continues to be a struggle but he is working through it.   Expected Outcomes -- Short: Attend [facility] regularly to improve shortness of breath with ADL's. Long: maintain independence with ADL's Short: follow up with Dr Laverta [place]:  maintain independence with ADLs Short; Continue to learn abotu tumor.  Long: Continue to monitor risk factors. Short: Start treatment Long: Maintain through treatment          Core Components/Risk Factors/Patient Goals at Discharge (Final Review):   Goals and Risk Factor Review - 05/02/20 0833      Core Components/Risk Factors/Patient Goals Review   Personal Goals Review Improve shortness of breath with ADL's;Weight Management/Obesity;Hypertension    Review Clair Gulling is doing well in rehab.  His weight is fairly steady. He is starting radiation this week.  His pressures continue to do well in class and at home.  Breathing continues to be a struggle but he is working through it.    Expected Outcomes Short: Start treatment Long:  Maintain through treatment           ITP Comments:  ITP Comments    Row Name 12/23/19 0909 01/04/20 1531 01/05/20 1039 01/06/20 0748 02/02/20 0624   ITP Comments Virtual Visit completed. Patient informed on EP and RD appointment and 6 Minute walk test. Patient also informed of patient health questionnaires on  My Chart. Patient Verbalizes understanding. Visit diagnosis can be found in [facility] 11/16/2019. Completed 6MWT and gym orientation. Initial ITP created and sent for review to Dr. Emily Filbert, Medical Director. 30 Day review completed. Medical Director ITP review done, changes made as directed, and signed approval by Medical Director. New to program First full day of exercise!  Patient was oriented to gym and equipment including functions, settings, policies, and procedures.  Patient's individual exercise prescription and treatment plan were reviewed.  All starting workloads were established based on the results of the 6 minute walk test done at initial orientation visit.  The plan for exercise progression was also introduced and progression will be customized based on patient's performance and goals. 30 Day review completed. Medical Director ITP review done, changes made as directed, and signed approval by Medical Director.   [place]Brien Name 02/07/20 1351 03/01/20 0936 03/29/20 0701 04/26/20 1008 05/12/20 1134   ITP Comments Completed initial RD Evaluation 30 Day review completed. Medical Director ITP review done, changes made as directed, and signed approval by Medical Director. 30 Day review completed. Medical Director ITP review done, changes made as directed, and signed approval by Medical Director. 30 Day review completed. Medical Director ITP review done, changes made as directed, and signed approval by Medical Director. Clair Gulling wishes to be discharged at 62/36 sessions due to leg pain issues. He feels comfortable exercising on his own and managing a healthy lifestyle          Comments: Discharge ITP

## 2020-05-15 ENCOUNTER — Ambulatory Visit
Admission: RE | Admit: 2020-05-15 | Discharge: 2020-05-15 | Disposition: A | Discharge status: Home / Self Care | Payer: Medicare Other | Source: Ambulatory Visit | Attending: Radiation Oncology | Admitting: Radiation Oncology

## 2020-05-15 ENCOUNTER — Other Ambulatory Visit: Payer: Self-pay

## 2020-05-15 ENCOUNTER — Inpatient Hospital Stay: Payer: Medicare Other

## 2020-05-15 DIAGNOSIS — C329 Malignant neoplasm of larynx, unspecified: Secondary | ICD-10-CM

## 2020-05-15 DIAGNOSIS — Z510 Encounter for antineoplastic radiation therapy: Secondary | ICD-10-CM | POA: Diagnosis not present

## 2020-05-15 LAB — CBC
HCT: 47.9 % (ref 39.0–52.0)
Hemoglobin: 15.7 g/dL (ref 13.0–17.0)
MCH: 29.1 pg (ref 26.0–34.0)
MCHC: 32.8 g/dL (ref 30.0–36.0)
MCV: 88.9 fL (ref 80.0–100.0)
Platelets: 228 10*3/uL (ref 150–400)
RBC: 5.39 MIL/uL (ref 4.22–5.81)
RDW: 12.6 % (ref 11.5–15.5)
WBC: 7.9 10*3/uL (ref 4.0–10.5)
nRBC: 0 % (ref 0.0–0.2)

## 2020-05-16 ENCOUNTER — Ambulatory Visit
Admission: RE | Admit: 2020-05-16 | Discharge: 2020-05-16 | Disposition: A | Discharge status: Home / Self Care | Payer: Medicare Other | Source: Ambulatory Visit | Attending: Radiation Oncology | Admitting: Radiation Oncology

## 2020-05-16 DIAGNOSIS — Z510 Encounter for antineoplastic radiation therapy: Secondary | ICD-10-CM | POA: Diagnosis not present

## 2020-05-17 ENCOUNTER — Ambulatory Visit
Admission: RE | Admit: 2020-05-17 | Discharge: 2020-05-17 | Disposition: A | Discharge status: Home / Self Care | Payer: Medicare Other | Source: Ambulatory Visit | Attending: Radiation Oncology | Admitting: Radiation Oncology

## 2020-05-17 DIAGNOSIS — Z510 Encounter for antineoplastic radiation therapy: Secondary | ICD-10-CM | POA: Diagnosis not present

## 2020-05-18 ENCOUNTER — Ambulatory Visit
Admission: RE | Admit: 2020-05-18 | Discharge: 2020-05-18 | Disposition: A | Discharge status: Home / Self Care | Payer: Medicare Other | Source: Ambulatory Visit | Attending: Radiation Oncology | Admitting: Radiation Oncology

## 2020-05-18 DIAGNOSIS — Z510 Encounter for antineoplastic radiation therapy: Secondary | ICD-10-CM | POA: Diagnosis not present

## 2020-05-19 ENCOUNTER — Other Ambulatory Visit: Payer: Self-pay | Admitting: *Deleted

## 2020-05-19 ENCOUNTER — Ambulatory Visit
Admission: RE | Admit: 2020-05-19 | Discharge: 2020-05-19 | Disposition: A | Discharge status: Home / Self Care | Payer: Medicare Other | Source: Ambulatory Visit | Attending: Radiation Oncology | Admitting: Radiation Oncology

## 2020-05-19 DIAGNOSIS — Z510 Encounter for antineoplastic radiation therapy: Secondary | ICD-10-CM | POA: Diagnosis not present

## 2020-05-19 MED ORDER — SUCRALFATE 1 G PO TABS: 0.5000 g | ORAL_TABLET | Freq: Three times a day (TID) | ORAL | 1 refills | Status: DC

## 2020-05-22 ENCOUNTER — Ambulatory Visit
Admission: RE | Admit: 2020-05-22 | Discharge: 2020-05-22 | Disposition: A | Discharge status: Home / Self Care | Payer: Medicare Other | Source: Ambulatory Visit | Attending: Radiation Oncology | Admitting: Radiation Oncology

## 2020-05-22 ENCOUNTER — Inpatient Hospital Stay: Payer: Medicare Other

## 2020-05-22 ENCOUNTER — Other Ambulatory Visit: Payer: Self-pay

## 2020-05-22 DIAGNOSIS — Z510 Encounter for antineoplastic radiation therapy: Secondary | ICD-10-CM | POA: Diagnosis not present

## 2020-05-22 DIAGNOSIS — C329 Malignant neoplasm of larynx, unspecified: Secondary | ICD-10-CM | POA: Diagnosis not present

## 2020-05-22 LAB — CBC
HCT: 48.9 % (ref 39.0–52.0)
Hemoglobin: 15.9 g/dL (ref 13.0–17.0)
MCH: 29.1 pg (ref 26.0–34.0)
MCHC: 32.5 g/dL (ref 30.0–36.0)
MCV: 89.4 fL (ref 80.0–100.0)
Platelets: 238 10*3/uL (ref 150–400)
RBC: 5.47 MIL/uL (ref 4.22–5.81)
RDW: 12.5 % (ref 11.5–15.5)
WBC: 8.1 10*3/uL (ref 4.0–10.5)
nRBC: 0 % (ref 0.0–0.2)

## 2020-05-23 ENCOUNTER — Ambulatory Visit
Admission: RE | Admit: 2020-05-23 | Discharge: 2020-05-23 | Disposition: A | Discharge status: Home / Self Care | Payer: Medicare Other | Source: Ambulatory Visit | Attending: Radiation Oncology | Admitting: Radiation Oncology

## 2020-05-23 DIAGNOSIS — Z510 Encounter for antineoplastic radiation therapy: Secondary | ICD-10-CM | POA: Diagnosis not present

## 2020-05-24 ENCOUNTER — Ambulatory Visit
Admission: RE | Admit: 2020-05-24 | Discharge: 2020-05-24 | Disposition: A | Discharge status: Home / Self Care | Payer: Medicare Other | Source: Ambulatory Visit | Attending: Radiation Oncology | Admitting: Radiation Oncology

## 2020-05-24 DIAGNOSIS — Z510 Encounter for antineoplastic radiation therapy: Secondary | ICD-10-CM | POA: Diagnosis not present

## 2020-05-25 ENCOUNTER — Ambulatory Visit
Admission: RE | Admit: 2020-05-25 | Discharge: 2020-05-25 | Disposition: A | Discharge status: Home / Self Care | Payer: Medicare Other | Source: Ambulatory Visit | Attending: Radiation Oncology | Admitting: Radiation Oncology

## 2020-05-25 DIAGNOSIS — Z510 Encounter for antineoplastic radiation therapy: Secondary | ICD-10-CM | POA: Diagnosis not present

## 2020-05-26 ENCOUNTER — Ambulatory Visit
Admission: RE | Admit: 2020-05-26 | Discharge: 2020-05-26 | Disposition: A | Discharge status: Home / Self Care | Payer: Medicare Other | Source: Ambulatory Visit | Attending: Radiation Oncology | Admitting: Radiation Oncology

## 2020-05-26 DIAGNOSIS — Z510 Encounter for antineoplastic radiation therapy: Secondary | ICD-10-CM | POA: Diagnosis not present

## 2020-05-29 ENCOUNTER — Other Ambulatory Visit: Payer: Self-pay

## 2020-05-29 ENCOUNTER — Ambulatory Visit
Admission: RE | Admit: 2020-05-29 | Discharge: 2020-05-29 | Disposition: A | Discharge status: Home / Self Care | Payer: Medicare Other | Source: Ambulatory Visit | Attending: Radiation Oncology | Admitting: Radiation Oncology

## 2020-05-29 ENCOUNTER — Inpatient Hospital Stay: Payer: Medicare Other

## 2020-05-29 ENCOUNTER — Other Ambulatory Visit: Payer: Self-pay | Admitting: *Deleted

## 2020-05-29 DIAGNOSIS — Z510 Encounter for antineoplastic radiation therapy: Secondary | ICD-10-CM | POA: Diagnosis not present

## 2020-05-29 DIAGNOSIS — C329 Malignant neoplasm of larynx, unspecified: Secondary | ICD-10-CM | POA: Diagnosis not present

## 2020-05-29 LAB — CBC
HCT: 48.4 % (ref 39.0–52.0)
Hemoglobin: 15.9 g/dL (ref 13.0–17.0)
MCH: 29.2 pg (ref 26.0–34.0)
MCHC: 32.9 g/dL (ref 30.0–36.0)
MCV: 89 fL (ref 80.0–100.0)
Platelets: 219 10*3/uL (ref 150–400)
RBC: 5.44 MIL/uL (ref 4.22–5.81)
RDW: 12.4 % (ref 11.5–15.5)
WBC: 8.1 10*3/uL (ref 4.0–10.5)
nRBC: 0 % (ref 0.0–0.2)

## 2020-05-29 NOTE — Progress Notes (Signed)
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  Past medical history of CAD, smoker, MI, HTN.  Patient receiving radiation alone.    Met with patient and wife after radiation.  Patient hard of hearing.  Reports that his throat is sore. Has been using baking soda and salt water rinses and carafate.  Reports that he is still eating but not as much and softer foods.  Breakfast is usually 2 eggs, grits, bacon, coffee and toast.  Lunch has been soups. Last night for dinner had chili beans.  Had frosty recently.  Likes pinto beans and other dried beans.  Wife reports that friend recently gave patient Fairlife shake and he has tried 1 of them and was able to drink it.  Reports that he is still able to taste foods.   Reports that he plays golf 3 days per week.    Medications: carafate   Labs: reviewed   Anthropometrics:   Height: 67.5 inches Weight: 192 lb 1 oz on 4/19 per [facility] 199 lb on 05/21/19 2/25 191 lb  3/21 188 lb  BMI: 29  Weight gain recently 3% weight loss overall in the last year.   Estimated Energy Needs  Kcals: 2175-2600 Protein: 108-130 g Fluid: 2 L   NUTRITION DIAGNOSIS: Inadequate oral intake related to cancer related treatment side effects as evidenced by sore throat and changing consistencies of foods eaten.      INTERVENTION:  Patient frustrated about seeing RD today.  Explained reasoning for visit with RD, RD's role, and importance of weight maintenance during treatment and maintaining good nutrition.   Discussed soft moist protein foods with patient and wife. Handout provided. Encouraged changing consistencies of foods (chopping, shredding, etc foods, adding gravies, sauces for moisture). Encouraged source of protein at every meal.  Encouraged Fairlife shake at least 1 time per day between a meal.  Can increase if oral intake decreases and weight declines.   Patient not interested in RD follow-up by phone or in person.  Contact information given to patient and encouraged to call if RD can be of assistance in the future.     Next Visit: no follow-up Patient/wife to contact if needed in the future  Jaydrian Corpening B. Zenia Resides, [place], [place] Registered Dietitian [phone] (mobile)

## 2020-05-30 ENCOUNTER — Ambulatory Visit
Admission: RE | Admit: 2020-05-30 | Discharge: 2020-05-30 | Disposition: A | Discharge status: Home / Self Care | Payer: Medicare Other | Source: Ambulatory Visit | Attending: Radiation Oncology | Admitting: Radiation Oncology

## 2020-05-30 DIAGNOSIS — Z510 Encounter for antineoplastic radiation therapy: Secondary | ICD-10-CM | POA: Diagnosis not present

## 2020-05-31 ENCOUNTER — Ambulatory Visit
Admission: RE | Admit: 2020-05-31 | Discharge: 2020-05-31 | Disposition: A | Discharge status: Home / Self Care | Payer: Medicare Other | Source: Ambulatory Visit | Attending: Radiation Oncology | Admitting: Radiation Oncology

## 2020-05-31 DIAGNOSIS — Z510 Encounter for antineoplastic radiation therapy: Secondary | ICD-10-CM | POA: Diagnosis not present

## 2020-06-01 ENCOUNTER — Ambulatory Visit
Admission: RE | Admit: 2020-06-01 | Discharge: 2020-06-01 | Disposition: A | Discharge status: Home / Self Care | Payer: Medicare Other | Source: Ambulatory Visit | Attending: Radiation Oncology | Admitting: Radiation Oncology

## 2020-06-01 DIAGNOSIS — Z510 Encounter for antineoplastic radiation therapy: Secondary | ICD-10-CM | POA: Diagnosis not present

## 2020-06-02 ENCOUNTER — Ambulatory Visit
Admission: RE | Admit: 2020-06-02 | Discharge: 2020-06-02 | Disposition: A | Discharge status: Home / Self Care | Payer: Medicare Other | Source: Ambulatory Visit | Attending: Radiation Oncology | Admitting: Radiation Oncology

## 2020-06-02 DIAGNOSIS — Z510 Encounter for antineoplastic radiation therapy: Secondary | ICD-10-CM | POA: Diagnosis not present

## 2020-06-05 ENCOUNTER — Ambulatory Visit
Admission: RE | Admit: 2020-06-05 | Discharge: 2020-06-05 | Disposition: A | Discharge status: Home / Self Care | Payer: Medicare Other | Source: Ambulatory Visit | Attending: Radiation Oncology | Admitting: Radiation Oncology

## 2020-06-05 ENCOUNTER — Inpatient Hospital Stay: Payer: Medicare Other | Attending: Radiation Oncology

## 2020-06-05 ENCOUNTER — Other Ambulatory Visit: Payer: Self-pay

## 2020-06-05 DIAGNOSIS — C329 Malignant neoplasm of larynx, unspecified: Secondary | ICD-10-CM | POA: Diagnosis not present

## 2020-06-05 DIAGNOSIS — Z510 Encounter for antineoplastic radiation therapy: Secondary | ICD-10-CM | POA: Diagnosis present

## 2020-06-05 LAB — CBC
HCT: 47 % (ref 39.0–52.0)
Hemoglobin: 15.6 g/dL (ref 13.0–17.0)
MCH: 29.7 pg (ref 26.0–34.0)
MCHC: 33.2 g/dL (ref 30.0–36.0)
MCV: 89.4 fL (ref 80.0–100.0)
Platelets: 227 10*3/uL (ref 150–400)
RBC: 5.26 MIL/uL (ref 4.22–5.81)
RDW: 12.6 % (ref 11.5–15.5)
WBC: 6.9 10*3/uL (ref 4.0–10.5)
nRBC: 0 % (ref 0.0–0.2)

## 2020-06-06 ENCOUNTER — Ambulatory Visit
Admission: RE | Admit: 2020-06-06 | Discharge: 2020-06-06 | Disposition: A | Discharge status: Home / Self Care | Payer: Medicare Other | Source: Ambulatory Visit | Attending: Radiation Oncology | Admitting: Radiation Oncology

## 2020-06-06 DIAGNOSIS — Z510 Encounter for antineoplastic radiation therapy: Secondary | ICD-10-CM | POA: Diagnosis not present

## 2020-06-07 ENCOUNTER — Ambulatory Visit
Admission: RE | Admit: 2020-06-07 | Discharge: 2020-06-07 | Disposition: A | Discharge status: Home / Self Care | Payer: Medicare Other | Source: Ambulatory Visit | Attending: Radiation Oncology | Admitting: Radiation Oncology

## 2020-06-07 DIAGNOSIS — Z510 Encounter for antineoplastic radiation therapy: Secondary | ICD-10-CM | POA: Diagnosis not present

## 2020-06-08 ENCOUNTER — Ambulatory Visit
Admission: RE | Admit: 2020-06-08 | Discharge: 2020-06-08 | Disposition: A | Discharge status: Home / Self Care | Payer: Medicare Other | Source: Ambulatory Visit | Attending: Radiation Oncology | Admitting: Radiation Oncology

## 2020-06-08 DIAGNOSIS — Z510 Encounter for antineoplastic radiation therapy: Secondary | ICD-10-CM | POA: Diagnosis not present

## 2020-06-09 ENCOUNTER — Ambulatory Visit
Admission: RE | Admit: 2020-06-09 | Discharge: 2020-06-09 | Disposition: A | Discharge status: Home / Self Care | Payer: Medicare Other | Source: Ambulatory Visit | Attending: Radiation Oncology | Admitting: Radiation Oncology

## 2020-06-09 DIAGNOSIS — Z510 Encounter for antineoplastic radiation therapy: Secondary | ICD-10-CM | POA: Diagnosis not present

## 2020-06-12 ENCOUNTER — Inpatient Hospital Stay: Payer: Medicare Other

## 2020-06-12 ENCOUNTER — Other Ambulatory Visit: Payer: Self-pay

## 2020-06-12 ENCOUNTER — Ambulatory Visit
Admission: RE | Admit: 2020-06-12 | Discharge: 2020-06-12 | Disposition: A | Discharge status: Home / Self Care | Payer: Medicare Other | Source: Ambulatory Visit | Attending: Radiation Oncology | Admitting: Radiation Oncology

## 2020-06-12 DIAGNOSIS — C329 Malignant neoplasm of larynx, unspecified: Secondary | ICD-10-CM

## 2020-06-12 DIAGNOSIS — Z510 Encounter for antineoplastic radiation therapy: Secondary | ICD-10-CM | POA: Diagnosis not present

## 2020-06-12 LAB — COMPREHENSIVE METABOLIC PANEL
ALT: 30 IU/L (ref 0–44)
AST: 32 IU/L (ref 0–40)
Albumin/Globulin Ratio: 1.7 (ref 1.2–2.2)
Albumin: 4.3 g/dL (ref 3.6–4.6)
Alkaline Phosphatase: 70 IU/L (ref 44–121)
BUN/Creatinine Ratio: 9 — ABNORMAL LOW (ref 10–24)
BUN: 12 mg/dL (ref 8–27)
Bilirubin Total: 0.9 mg/dL (ref 0.0–1.2)
CO2: 22 mmol/L (ref 20–29)
Calcium: 9.7 mg/dL (ref 8.6–10.2)
Chloride: 100 mmol/L (ref 96–106)
Creatinine, Ser: 1.3 mg/dL — ABNORMAL HIGH (ref 0.76–1.27)
Globulin, Total: 2.5 g/dL (ref 1.5–4.5)
Glucose: 110 mg/dL — ABNORMAL HIGH (ref 65–99)
Potassium: 4.5 mmol/L (ref 3.5–5.2)
Sodium: 138 mmol/L (ref 134–144)
Total Protein: 6.8 g/dL (ref 6.0–8.5)
eGFR: 53 mL/min/{1.73_m2} — ABNORMAL LOW (ref >59)

## 2020-06-12 LAB — CBC
HCT: 48.9 % (ref 39.0–52.0)
Hematocrit: 48.2 % (ref 37.5–51.0)
Hemoglobin: 16 g/dL (ref 13.0–17.7)
Hemoglobin: 16.2 g/dL (ref 13.0–17.0)
MCH: 29.1 pg (ref 26.6–33.0)
MCH: 29.4 pg (ref 26.0–34.0)
MCHC: 33.2 g/dL (ref 31.5–35.7)
MCHC: 33.1 g/dL (ref 30.0–36.0)
MCV: 88 fL (ref 79–97)
MCV: 88.7 fL (ref 80.0–100.0)
Platelets: 249 10*3/uL (ref 150–450)
Platelets: 240 10*3/uL (ref 150–400)
RBC: 5.49 x10E6/uL (ref 4.14–5.80)
RBC: 5.51 MIL/uL (ref 4.22–5.81)
RDW: 12.2 % (ref 11.6–15.4)
RDW: 12.3 % (ref 11.5–15.5)
WBC: 6.2 10*3/uL (ref 3.4–10.8)
WBC: 8.3 10*3/uL (ref 4.0–10.5)
nRBC: 0 % (ref 0.0–0.2)

## 2020-06-12 LAB — LIPID PANEL
Chol/HDL Ratio: 3.3 ratio (ref 0.0–5.0)
Cholesterol, Total: 138 mg/dL (ref 100–199)
HDL: 42 mg/dL (ref >39)
LDL Chol Calc (NIH): 69 mg/dL (ref 0–99)
Triglycerides: 157 mg/dL — ABNORMAL HIGH (ref 0–149)
VLDL Cholesterol Cal: 27 mg/dL (ref 5–40)

## 2020-06-13 ENCOUNTER — Ambulatory Visit
Admission: RE | Admit: 2020-06-13 | Discharge: 2020-06-13 | Disposition: A | Discharge status: Home / Self Care | Payer: Medicare Other | Source: Ambulatory Visit | Attending: Radiation Oncology | Admitting: Radiation Oncology

## 2020-06-13 DIAGNOSIS — Z510 Encounter for antineoplastic radiation therapy: Secondary | ICD-10-CM | POA: Diagnosis not present

## 2020-06-13 LAB — TSH: TSH: 3.94 u[IU]/mL (ref 0.450–4.500)

## 2020-06-14 ENCOUNTER — Ambulatory Visit
Admission: RE | Admit: 2020-06-14 | Discharge: 2020-06-14 | Disposition: A | Discharge status: Home / Self Care | Payer: Medicare Other | Source: Ambulatory Visit | Attending: Radiation Oncology | Admitting: Radiation Oncology

## 2020-06-14 DIAGNOSIS — Z510 Encounter for antineoplastic radiation therapy: Secondary | ICD-10-CM | POA: Diagnosis not present

## 2020-06-15 ENCOUNTER — Ambulatory Visit
Admission: RE | Admit: 2020-06-15 | Discharge: 2020-06-15 | Disposition: A | Discharge status: Home / Self Care | Payer: Medicare Other | Source: Ambulatory Visit | Attending: Radiation Oncology | Admitting: Radiation Oncology

## 2020-06-15 DIAGNOSIS — Z510 Encounter for antineoplastic radiation therapy: Secondary | ICD-10-CM | POA: Diagnosis not present

## 2020-06-16 ENCOUNTER — Ambulatory Visit
Admission: RE | Admit: 2020-06-16 | Discharge: 2020-06-16 | Disposition: A | Discharge status: Home / Self Care | Payer: Medicare Other | Source: Ambulatory Visit | Attending: Radiation Oncology | Admitting: Radiation Oncology

## 2020-06-16 DIAGNOSIS — Z510 Encounter for antineoplastic radiation therapy: Secondary | ICD-10-CM | POA: Diagnosis not present

## 2020-06-19 ENCOUNTER — Ambulatory Visit
Admission: RE | Admit: 2020-06-19 | Discharge: 2020-06-19 | Disposition: A | Discharge status: Home / Self Care | Payer: Medicare Other | Source: Ambulatory Visit | Attending: Radiation Oncology | Admitting: Radiation Oncology

## 2020-06-19 DIAGNOSIS — Z510 Encounter for antineoplastic radiation therapy: Secondary | ICD-10-CM | POA: Diagnosis not present

## 2020-06-20 ENCOUNTER — Other Ambulatory Visit: Payer: Self-pay

## 2020-06-20 ENCOUNTER — Ambulatory Visit (INDEPENDENT_AMBULATORY_CARE_PROVIDER_SITE_OTHER): Payer: Medicare Other | Admitting: Cardiovascular Disease

## 2020-06-20 ENCOUNTER — Encounter: Payer: Self-pay | Admitting: Cardiovascular Disease

## 2020-06-20 DIAGNOSIS — I714 Abdominal aortic aneurysm, without rupture, unspecified: Secondary | ICD-10-CM

## 2020-06-20 DIAGNOSIS — E785 Hyperlipidemia, unspecified: Secondary | ICD-10-CM | POA: Diagnosis not present

## 2020-06-20 DIAGNOSIS — I10 Essential (primary) hypertension: Secondary | ICD-10-CM

## 2020-06-20 DIAGNOSIS — I5189 Other ill-defined heart diseases: Secondary | ICD-10-CM

## 2020-06-20 DIAGNOSIS — I2510 Atherosclerotic heart disease of native coronary artery without angina pectoris: Secondary | ICD-10-CM

## 2020-06-20 DIAGNOSIS — C320 Malignant neoplasm of glottis: Secondary | ICD-10-CM

## 2020-06-20 NOTE — Progress Notes (Signed)
Patient ID: Nicholas Douglas, male   DOB: 1932/01/11, 87 y.o.   MRN: [id number]     HPI: Nicholas Douglas is an 87 y.o. Caucasian male who presents to the office today for a 7 month cardiology evaluation.  Nicholas Douglas has known CAD and underwent numerous interventions dating back to 52, 1991, 1993, and in 1997. Cardiac catheterization in 2011 which showed preserved LV function with mild residual distal inferior apical hypocontractility. There is evidence for coronary calcification with segmental narrowing of his LAD of 30-40% proximally, 50% diffusely, in the midsegment, and 70-80% in the distal region, he had AV groove circumflex stenoses of 70 and 50% with a 40% OM 2 stenosis, and a 30-40 and 50% RCA stenoses with 80-90% stenosis in the acute marginal branch. He had been on medical therapy.  He presented to [facility] in March 2015 with class IV angina and catheterization demonstrated severe 2 vessel CAD with significant current calcification of the LAD with up to 95% mid LAD stenosis and diffuse proximal stenosis. He underwent successful high-speed rotational atherectomy the following day by Dr. Martinique and a 1.5 mm bur was used. Mid LAD was stented with a 2.75x38 mm Promus stent in the proximal LAD was stented with a 3.0x28 mm Promus stent. Medical therapy was recommended for his distal LAD stenosis. He has only one kidney and staged intervention to the left circumflex coronary artery was recommended.  He underwent staged left circumflex PCI on 04/27/2013 by me with successful high-speed rotational atherectomy with a 1.5 and 1.75 mm burr, and ultimately had a 2.5x28 mm Promus premier DES stent inserted into the circumflex vessel, which was post dilated to approximately 2.6 mm.  Subsequently he has felt significantly improved with resolution of any chest pain  He has a history of significant hyperlipidemia and has had significant increased number of small LDL particles and insulin resistance. This seemed to  markedly improve with the addition of Niaspan added to zetia and lovaza.  Remotely, he had been on statins consisting of Lipitor and subsequently Crestor. He did have transient LFT elevation. He also concerns of possible risk of developing dementia with statin therapy.  He had  some episodes of Niaspan induced diffuse flushing. He wanted to stop taking his Niaspan. He did have subsequent NMR off Niaspan and done just on Zetia and as well as 2 g of Lovaza.  This showed increased abnormalities such that his total cholesterol was 201, LDL had risen to 124, but he now had LDL small particles which have increased from 685 to 1348. Laboratory on 10/26/2012: LDL particle number was elevated at 1657, LDL cholesterol 112 triglycerides 155 and total cholesterol 190. He continued to be insulin resistant with insulin resistance scored 65. When I last saw him, we elected to try Livalo and ultimately titrated this to 4 mg daily to take in addition to his Zetia 10 mg. Laboratory on 02/22/2013: LDL particle number was  markedly improved at 774 with an HDL of 40 calculated LDL 50 small LDL particle #417. Insulin resistance score was also improved at 51.  In July 2015 he developed abdominal discomfort and was found to have acute appendicitis. On CT imaging he was also noted to have prostate enlargement with nodularity and thickening of the bladder base and cystoscopy was suggested for further evaluation. He is status post left nephrectomy. He also was noted to have a 2.3 cm infrarenal suprailiac abdominal aortic aneurysm.  He has been on Livalo 2 mg, Zetia 10  mg for hyperlipidemia.  Lab work on 12/22/2013 showed a cholesterol 155, triglycerides 130, HDL 45, LDL 84.  He has been on amlodipine 10 mg atenolol 12.5 mg twice a day and valsartan 80 mg daily for blood pressure.  He continues to be on aspirin and Plavix for dual antiplatelet therapy.  A nuclear perfusion study on 06/30/2014  revealed normal perfusion and function with an  ejection fraction of 57%.    When he underwent a CT scan for his appendicitis he was told of having a small abdominal aortic aneurysm.  His mother also had an abdominal aortic aneurysm.  The patient recently sprained his right ankle.  As result, he has not been able to be as active as he had in the past and has not been able to play golf which typically he had played up to 4 times per week, often scoring in the 70s.    He was recently admitted to the hospital in April 2017 with complaints of increasing episodes of chest discomfort.  Troponins were mildly positive at 0.12, consistent with a non-STEMI.  I performed cardiac catheterization on 05/28/2015.  This showed widely patent stents in the LAD and circumflex vessels.  The RCA was diffusely diseased with calcification in the proximal to mid region with distal 99% stenosis and a noncalcified distal RCA segment immediately after the PDA takeoff.  ECI was difficult due to the calcified RCA preventing placement of a stent since the stent was never able to be passed beyond this calcified angled segment despite even attempting guide liner support.  Ultimately, he underwent successful PTCA of the 99% stenosis, which was reduced to 0%.  Since his intervention he had noticed dramatic improvement in his previous symptomatology.  He continues to feel well and is playing golf 3 times per week. He had a basal cell cancer removed from his nose.   When I saw him in April 2018 he was without chest pain or significant shortness of breath and was playing golf at least 3 days per week.  Laboratory showed a total cholesterol 147, triglycerides 151, HDL 43, LDL 74 on regimen of Zetia 10 mg and Livalo 2 mg.  In the past he's been intolerant to Crestor, Lipitor, and Zocor.  I further titrated Livalo 24 mg daily which  he has tolerated. An abdominal ultrasound showed aortoiliac atherosclerosis without aneurysm.   When I saw him in June 2018 he had begun to notice increasing  shortness of breath with walking. He denies any of the chest tightness or pressure that he had experienced prior to his interventions.  At his last catheterization, he had extensive calcification in his RCA and PTCA alone was done since the stent was unable to reach the subtotal distal RCA.  He denies recurrent chest tightness.  He was questioning whether or not he could have had  pneumonia comtributing to his shortness of breath.  At that evaluation, I further titrated his Imdur to 60 mg daily.  His blood pressure was also elevated and I titrated his ARB therapy.  He sagittally saw Caro Hight as an add-on in with complaints of increasing symptomatology definitive cardiac catheterization was recommended.  He underwent repeat cardiac catheterization on 08/05/2016 by Dr. Peter Douglas.  This continues to show patency of the stents in the proximal to mid LAD and patency of the stented the mid circumflex as well as patent PTCA site of the PL OM vessel.  There was no new disease to explain his symptoms.  He  had normal LV function and normal left ventricular end-diastolic pressure.  Following the catheterization, he has felt well, but he continues to experience some shortness of breath with weakness and decreased energy.  He denies any chest tightness.  Recent laboratory done 10 days ago revealed an elevated potassium at 5.7.  On recheck 3 days later was 5.4.  Subsequent, he has been taken off ARB therapy.    In August 2018 he was complaining of significant fatigue.  His blood pressure was stable and his pulse was 55.  I recommended he reduce his atenolol from 12.5 mg twice a day to just 12.5 mg at bedtime.  I also reduced his isosorbide from 60 mg down to 30 mg in light of his recent catheterization findings.  His prior hypokalemia, resolved with discontinuance of ARB, but more importantly with discontinuance of his excessive exogenous potassium intake with certain foods.   When I saw him in October 2018 he  was fairly stable. Over the winter months he has had difficulty with his back and required injections.  This has limited his exercise.  He admits to gaining weight.  He has noticed that he is more short of breath with walking up hills.  Blood pressures at home often been in the 140-150 range.  He denies any chest pressure.  He is unaware of palpitations.  He had repeat lab work one week ago.  Renal function was stable with a creatinine 1.15.  Potassium was 4.7.  LFTs were normal.  Hemoglobin and hematocrit were stable.  Lipid studies were excellent.   When I saw him in April 2019 his blood pressure recordings at home were in the 130-150 range and he had experience some shortness of breath.  I suggested a trial of HCTZ 12.5 mg every other day.  He was tolerating livalo 4 mg, vascepa and Zetia for hyperlipidemia.  Over the past 6 months, he has been without recurrent anginal symptoms.  He continues to play golf at least 3 days/week and typically score is 75 or below.  He underwent repeat laboratory last week and lipid studies remain excellent with total cholesterol 124, LDL 57, triglycerides 82, HDL 51.  Shortness of breath has improved.  He denies any orthopnea.   When I saw him in October 2019 at which time he was feeling well and his shortness of breath had improved.  His blood pressure was excellent on a regimen consisting of amlodipine 10 mg, atenolol 12.5 mg at bedtime and he had not had any recent swelling.  He was no longer taking HCTZ.  He was playing golf 3 days/week.  He was evaluated in a telemedicine visit on Jun 17, 2018.  At that time he was remaining stable and denied any symptoms of chest pain PND orthopnea.  He is still playing golf at least 3 days/week and his highest score is 82 over the past year.  He does not routinely exercise on the days he does not play golf.  He states his blood pressures typically has been running in the 151V with diastolics in the 61Y to 07P.  He denied any  palpitations, episodes of presyncope or syncope.   I saw him as an add-on in the office on December 21, 2018 after he had begun to notice more exertional dyspnea as well as upper back discomfort which in the past may have been his anginal equivalent.  With progressive symptoms repeat echocardiography and definitive cardiac catheterization was recommended.    An echo Doppler study  on December 30, 2018 showed an EF of 55 to 60%.  There was mild aortic sclerosis.  Diastolic dysfunction was indeterminate.  Previously had been noted to have grade 1 diastolic dysfunction on prior echocardiography.  He underwent successful catheterization on January 04, 2019 which showed widely patent stents in the LAD and circumflex vessel.  The LAD had 40% stenosis in a small diagonal branch arising just distal to the stent in the mid LAD.  There was mild 20% mid LAD stenosis.  The circumflex vessel had smooth 20% narrowing proximal to the proximal stent.  There was mild 40 and 30% narrowings and small marginal branches. The RCA had mild CAD of 20 and 30% with calcification and there was 70% stenosis in a small acute marginal branch ostially.  Medical therapy was recommended.  I saw him for initial follow-up of his cardiac catheterization on January 13, 2019 at which time he was doing well on medical therapy.  When I last saw him in April 2016 he remained stable.  He was playing golf 3 times per week and still scoring in the low to mid 70s. He continued to be on amlodipine 10 mg, isosorbide 60 mg a atenolol 12.5 mg twice a day in addition to spironolactone both for blood pressure and his CAD.  He continues to be on DAPT with aspirin/Plavix.  He tolerates Livalo in addition to [employer].    I last saw him in October 2021 at which time he continued to remain stable.  He had undergone a CT angio of his chest with contrast by his pulmonologist which showed minimal bibasilar subsegmental atelectasis.  He was noted to have coronary  artery calcification as well as aortic atherosclerosis.  There was no evidence for PE.  He continues to play golf at least 3 days/week and his scores are consistently below 78.  He continues to be on DAPT but denies bleeding.  He is on Zetia 10 mg in addition to Livalo 4 mg and Vascepa 1 capsule twice a day.  Recent lipid studies showed a total cholesterol 152, HDL 52, LDL 78 and triglycerides 122 from November 23, 2019.  Renal function remained stable and he is continue to be on amlodipine 10 mg, isosorbide 60 mg, atenolol 12.5 mg twice a day and spironolactone 12.5 mg daily.  He has not had any anginal symptomatology or change in exercise capacity.    Since I last saw him, he started to develop some hoarseness to his voice in December.  He subsequently underwent ENT evaluation by Dr. Clyde Canterbury and was found to have a friable 4 to 5 mm lesion of the left mid to anterior vocal cord.  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  Today he completed his 33rd and final radiation treatment by Dr. Donella Stade.  Presently he denies any chest pain or shortness of breath.  He has been somewhat more fatigued with the radiation.  He recently underwent laboratory on Jun 12, 2020.  LDL cholesterol was improved at 69, total cholesterol 138, triglycerides were minimally increased at 157.  Creatinine was 1.30 with an estimated GFR at 53.  LFTs were normal.  TSH and CBC were normal.  He presents for evaluation.  Past Medical History:  Diagnosis Date  . BPH (benign prostatic hyperplasia)    Followed by [name]/urology  . CAD S/P PTCA only of RPAV-PL 05/31/2015   99% --> 0%PAV - PTCA only (unable to advance STENT) due to RCA calcification - prox & distal RCA 30%;  Patent LAD stent & Cx stent (~20% ISR).   . Calculus of kidney    "that's how they found the cancer" (04/27/2013)  . Coronary artery disease involving native coronary artery of native heart with unstable angina pectoris ([place]) 12/03/2011   S/P Cardiac  angioplasty 1986, 1991, 1993, 1997.  Last cardiac catheterization 2001.  Cardiolite 05/2011 low risk with normal EF 65%.  Followed by cardiology/[facility] every six months.   . Dyspnea    only on exertion  . History of myocardial infarction 1976  . Hypertension   . Impotence of organic origin   . Internal hemorrhoids without mention of complication   . Malignant neoplasm of kidney, except pelvis   . Melanoma ([place]) 06/2010   Left arm  . 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  . Myocardial infarction ([place])   . Other abnormal blood chemistry   . Other nonspecific abnormal serum enzyme levels   . Personal history of colonic polyps   . Pure hypercholesterolemia   . Tobacco use disorder   . Unspecified congenital cystic kidney disease   . Unstable angina ([place]) 04/05/2013; 05/2015    Past Surgical History:  Procedure Laterality Date  . APPENDECTOMY  2014  . CARDIAC CATHETERIZATION N/A 05/30/2015   Procedure: Left Heart Cath and Coronary Angiography;  Surgeon: [name], MD;  Location: [place] [facility];  Service: Cardiovascular;  Laterality: N/A;  . CARDIAC CATHETERIZATION N/A 05/30/2015   Procedure: Coronary Balloon Angioplasty;  Surgeon: [name], MD;  Location: [place] [facility];  Service: Cardiovascular;  Laterality: N/A;  . Cardiolite  05/06/2011   low risk study; normal EF.  [facility]  . carotid dopplers  03/08/2011   minimal plaque formation B. Symptoms: dizziness.  . COLONOSCOPY  10/05/2008   single polyp, [facility].  [name].  Repeat in 3 years.  . CORONARY ANGIOPLASTY     "I've had 4" (04/27/2013)  . CORONARY ANGIOPLASTY WITH STENT PLACEMENT  04/2013; 04/27/2013   "2 + 1" (04/27/2013)  . EYE SURGERY  11/04/2013   Cataract surgery B; [place].  Marland Kitchen LEFT HEART CATH AND CORONARY ANGIOGRAPHY N/A 08/05/2016   Procedure: Left Heart Cath and Coronary Angiography;  Surgeon: Douglas, Peter M, MD;  Location: [place] [facility];  Service: Cardiovascular;  Laterality: N/A;   . LEFT HEART CATH AND CORONARY ANGIOGRAPHY N/A 01/04/2019   Procedure: LEFT HEART CATH AND CORONARY ANGIOGRAPHY;  Surgeon: [name], MD;  Location: [place] [facility];  Service: Cardiovascular;  Laterality: N/A;  . MELANOMA EXCISION Left ~ 2007   "arm"  . MELANOMA EXCISION  02/07/2015   R upper back. [facility]  . MICROLARYNGOSCOPY N/A 04/06/2020   Procedure: MICROLARYNGOSCOPY WITH BIOPSY OF LARYNX;  Surgeon: Clyde Canterbury, MD;  Location: [facility];  Service: ENT;  Laterality: N/A;  . NEPHRECTOMY Left 2006  . PERCUTANEOUS CORONARY ROTOBLATOR INTERVENTION (PCI-R) N/A 04/06/2013   Procedure: PERCUTANEOUS CORONARY ROTOBLATOR INTERVENTION (PCI-R);  Surgeon: [name], MD;  Location: [facility] C[facility] LAB;  Service: Cardiovascular;  Laterality: N/A;  . PERCUTANEOUS CORONARY STENT INTERVENTION (PCI-S) N/A 04/27/2013   Procedure: PERCUTANEOUS CORONARY STENT INTERVENTION (PCI-S);  Surgeon: [name], MD;  Location: [facility] CATH LAB;  Service: Cardiovascular;  Laterality: N/A;    Allergies  Allergen Reactions  . Angiotensin Receptor Blockers     Other reaction(s): Kidney Disorder Hyperkalemia  . Lovastatin Other (See Comments)    Elevated liver enzymes  . Morphine Hives  .  Nifedipine Other (See Comments)    Elevated liver enzymes    Current Outpatient Medications  Medication Sig Dispense Refill  . acetaminophen (TYLENOL) 500 MG tablet Take 500 mg by mouth every 6 (six) hours as needed (for pain.).     Marland Kitchen amLODipine (NORVASC) 10 MG tablet Take 1 tablet (10 mg total) by mouth every evening. 90 tablet 3  . aspirin 81 MG tablet Take 81 mg by mouth every evening.    Marland Kitchen atenolol (TENORMIN) 25 MG tablet TAKE ONE-HALF (1/2) TABLET  BY MOUTH TWICE A DAY 90 tablet 3  . Carboxymethylcell-Hypromellose (GENTEAL OP) Apply 1 drop to eye 2 (two) times daily.    . clopidogrel (PLAVIX) 75 MG tablet Take 1 tablet (75 mg total) by mouth daily. 90 tablet 3  . ezetimibe (ZETIA) 10 MG tablet TAKE 1 TABLET BY MOUTH  DAILY 90  tablet 3  . finasteride (PROSCAR) 5 MG tablet Take 1 tablet (5 mg total) by mouth daily. (Patient taking differently: Take 2.5 mg by mouth daily.) 90 tablet 3  . isosorbide mononitrate (IMDUR) 60 MG 24 hr tablet TAKE 1 TABLET BY MOUTH  DAILY (Patient taking differently: Take 60 mg by mouth daily.) 90 tablet 3  . nitroGLYCERIN (NITROSTAT) 0.4 MG SL tablet PLACE 1 TABLET (0.4 MG TOTAL) UNDER THE TONGUE EVERY 5 (FIVE) MINUTES AS NEEDED FOR CHEST PAIN. 25 tablet 11  . Pitavastatin Calcium (LIVALO) 4 MG TABS Take 1 tablet (4 mg total) by mouth every evening. 90 tablet 3  . spironolactone (ALDACTONE) 25 MG tablet Take 0.5 tablets (12.5 mg total) by mouth daily. 45 tablet 3  . sucralfate (CARAFATE) 1 g tablet Take 0.5 tablets (0.5 g total) by mouth 3 (three) times daily. Dissolve in 3-4 tbsp warm water, swish and swallow 60 tablet 1  . traZODone (DESYREL) 50 MG tablet Take 50 mg by mouth at bedtime as needed for sleep.    Marland Kitchen VASCEPA 1 g capsule TAKE 1 CAPSULE BY MOUTH  TWICE DAILY 180 capsule 3   No current facility-administered medications for this visit.    Socially he remains active. He is married has 5 children 10 grandchildren 2 great-grandchildren. Is no alcohol use. He typically scores below 75 and plays golf 3 days per week.  He typically scores in the 70s, better than his age.  ROS General: Negative; No fevers, chills, or night sweats;  Improved energy HEENT: Squamous cell CA of vocal cord, status postbiopsy and radiation treatment Pulmonary: Positive for shortness of breath Cardiovascular: See history of present illness;  GI: Negative; No nausea, vomiting, diarrhea, or abdominal pain GU: Negative; No dysuria, hematuria, or difficulty voiding Musculoskeletal: Negative; no myalgias, joint pain, or weakness Hematologic/Oncology: Negative; no easy bruising, bleeding Endocrine: Negative; no heat/cold intolerance; no diabetes Neuro: Negative; no changes in balance, headaches Skin: Negative; No  rashes or skin lesions Psychiatric: Negative; No behavioral problems, depression Sleep: Negative; No snoring, daytime sleepiness, hypersomnolence, bruxism, restless legs, hypnogognic hallucinations, no cataplexy Other comprehensive 14 point system review is negative.   PE BP (!) 146/78   Pulse 62   Ht 5' 7.5" (1.715 m)   Wt 187 lb 9.6 oz (85.1 kg)   SpO2 94%   BMI 28.95 kg/m    Repeat blood pressure by me today was 114/76  Wt Readings from Last 3 Encounters:  06/20/20 187 lb 9.6 oz (85.1 kg)  04/24/20 188 lb (85.3 kg)  04/14/20 194 lb (88 kg)   General: Alert, oriented, no distress.  Skin:  normal turgor, no rashes, warm and dry HEENT: Normocephalic, atraumatic. Pupils equal round and reactive to light; sclera anicteric; extraocular muscles intact;  Nose without nasal septal hypertrophy Mouth/Parynx benign;  Neck: No JVD, no carotid bruits; normal carotid upstroke Lungs: clear to ausculatation and percussion; no wheezing or rales Chest wall: without tenderness to palpitation Heart: PMI not displaced, RRR, s1 s2 normal, 1/6 systolic murmur, no diastolic murmur, no rubs, gallops, thrills, or heaves Abdomen: soft, nontender; no hepatosplenomehaly, BS+; abdominal aorta nontender and not dilated by palpation. Back: no CVA tenderness Pulses 2+ Musculoskeletal: full range of motion, normal strength, no joint deformities Extremities: no clubbing cyanosis or edema, Homan's sign negative  Neurologic: grossly nonfocal; Cranial nerves grossly wnl Psychologic: Normal mood and affect   ECG (independently read by me): Normal sinus rhythm at 62 bpm.  No ectopy.  Normal intervals    October 2021 ECG (independently read by me):  Sinus bradycardia at 56; no ectopy, normal intervals    May 21, 2019 ECG (independently read by me): Sinus bradycardia 54 bpm.  PR interval 188 ms, QTc interval 421 ms.  No ectopy.  January 13, 2019 ECG (independently read by me): Sinus bradycardia 54 bpm.  No  ectopy.  PR interval 192 ms, QTc interval 4 3 ms  December 21, 2018 ECG (independently read by me): Normal sinus rhythm at 77 bpm.  Nonspecific ST changes.  Normal intervals  October 20119 ECG (independently read by me): Normal sinus rhythm at 61 bpm.  No ectopy.  Normal intervals.  April 2019 ECG (independently read by me):normal sinus rhythm at 61 bpm.  No ectopy.  No ST segment changes.  October 2018 ECG (independently read by me): Sinus bradycardia 57 bpm.  No ST segment changes.  Normal intervals.  August 2018 ECG (independently read by me): Sinus bradycardia 55 bpm.  Normal intervals.  No significant ST-T changes  June 2018 ECG (independently read by me): Sinus bradycardia 58 bpm.  Normal intervals.  No significant ST-T changes.  April 2018 ECG (independently read by me): Normal sinus rhythm at 63 bpm.  Normal intervals.  No ST segment changes.  September 2017 ECG (independently read by me): Normal sinus rhythm at 60 bpm.  Nonspecific T changes.  Intervals are normal.  May 2017 ECG (independently read by me): Sinus bradycardia 55 bpm.  No ectopy.  Normal intervals.  Nondiagnostic T changes in lead 3.  April 2017 ECG (independently read by me): Sinus bradycardia 55 bpm.  No ectopy.  No significant ST changes.  Normal intervals.  01/12/2015 ECG (independently read by me): Sinus bradycardia 54 bpm.  No ectopy.  Normal intervals.  June 2016 ECG (independently read by me): Sinus bradycardia 58 bpm.  Normal intervals.  No ectopy. Non-specific T change aVL  November 2015 ECG (independently read by me): Sinus bradycardia 54 bpm.  No significant ST-T changes.  Normal intervals.  May 2015 ECG (independently read by me): Sinus bradycardia 54 beats per minute.  No ectopy.  QTc interval 396 ms.  No significant ST changes.  04/22/2013 ECG (independently read by me): Sinus bradycardia 55 beats per minute. Nonspecific ST changes  03/01/2013 ECG (independently read by me): Sinus bradycardia 52  beats per minute. Normal intervals. No significant ST changes.  Prior ECG of 11/20/2012: Sinus rhythm at 51 beats per minute. QTc interval 400 ms. No significant ST changes.  LABS:  BMP Latest Ref Rng & Units 06/12/2020 11/23/2019 10/12/2019  Glucose 65 - 99 mg/dL 110(H) 95 -  BUN  8 - 27 mg/dL 12 18 -  Creatinine 0.76 - 1.27 mg/dL 1.30(H) 1.29(H) 1.40(H)  BUN/Creat Ratio 10 - 24 9(L) 14 -  Sodium 134 - 144 mmol/L 138 141 -  Potassium 3.5 - 5.2 mmol/L 4.5 4.6 -  Chloride 96 - 106 mmol/L 100 103 -  CO2 20 - 29 mmol/L 22 22 -  Calcium 8.6 - 10.2 mg/dL 9.7 9.4 -   Hepatic Function Latest Ref Rng & Units 06/12/2020 11/23/2019 05/13/2019  Total Protein 6.0 - 8.5 g/dL 6.8 7.1 7.1  Albumin 3.6 - 4.6 g/dL 4.3 4.5 4.2  AST 0 - 40 IU/L 32 46(H) 35  ALT 0 - 44 IU/L 30 43 36  Alk Phosphatase 44 - 121 IU/L 70 65 62  Total Bilirubin 0.0 - 1.2 mg/dL 0.9 0.7 0.9  Bilirubin, Direct 0.00 - 0.40 mg/dL - - -   CBC Latest Ref Rng & Units 06/12/2020 06/12/2020 06/05/2020  WBC 4.0 - 10.5 K/uL 8.3 6.2 6.9  Hemoglobin 13.0 - 17.0 g/dL 16.2 16.0 15.6  Hematocrit 39.0 - 52.0 % 48.9 48.2 47.0  Platelets 150 - 400 K/uL 240 249 227   Lab Results  Component Value Date   MCV 88.7 06/12/2020   MCV 88 06/12/2020   MCV 89.4 06/05/2020   Lab Results  Component Value Date   TSH 3.940 06/12/2020  .  Lipid Panel     Component Value Date/Time   CHOL 138 06/12/2020 0739   TRIG 157 (H) 06/12/2020 0739   TRIG 131 02/22/2013 0804   HDL 42 06/12/2020 0739   HDL 40 02/22/2013 0804   CHOLHDL 3.3 06/12/2020 0739   CHOLHDL 3.5 05/09/2015 1400   VLDL 31 (H) 05/09/2015 1400   LDLCALC 69 06/12/2020 0739   LDLCALC 50 02/22/2013 0804   RADIOLOGY: Dg Chest 2 View  08/17/2012   *RADIOLOGY REPORT*  Clinical Data: Renal cell carcinoma  CHEST - 2 VIEW  Comparison: 05/08/11  Findings: The cardiomediastinal silhouette is stable.  No acute infiltrate or pleural effusion.  No pulmonary edema.  Bony thorax is unremarkable.  IMPRESSION:  No active disease.  No significant change.   Original Report Authenticated By: Lahoma Crocker, M.D.    CV STUDIES:  Echo 12/30/2018 Left ventricular ejection fraction, by visual estimation, is 55 to 60%. The left ventricle has normal function. There is no left ventricular hypertrophy. 2. Left ventricular diastolic parameters are indeterminate. 3. The left ventricle has no regional wall motion abnormalities. 4. Global right ventricle has normal systolic function.The right ventricular size is normal. No increase in right ventricular wall thickness. 5. Left atrial size was mildly dilated. 6. Right atrial size was normal. 7. The mitral valve is normal in structure. No evidence of mitral valve regurgitation. No evidence of mitral stenosis. 8. The tricuspid valve is normal in structure. Tricuspid valve regurgitation is not demonstrated. 9. The aortic valve is normal in structure. Aortic valve regurgitation is trivial. Mild aortic valve sclerosis without stenosis. 10. The pulmonic valve was grossly normal. Pulmonic valve regurgitation is not visualized. 11. The inferior vena cava is normal in size with greater than 50% respiratory variability, suggesting right atrial pressure of 3 mmHg.   CARDIAC CATHETERIZATION: 01/04/2019   Mid LM to Mid LAD lesion is 10% stenosed.  2nd Diag lesion is 40% stenosed.  Mid LAD to Dist LAD lesion is 20% stenosed.  Prox Cx to Mid Cx lesion is 5% stenosed.  Ost Cx to Prox Cx lesion is 20% stenosed.  1st Mrg lesion  is 40% stenosed.  2nd Mrg lesion is 30% stenosed.  Ost RCA to Mid RCA lesion is 20% stenosed.  Acute Mrg lesion is 70% stenosed.  Dist RCA lesion is 30% stenosed.  RPAV lesion is 20% stenosed.  The left ventricular systolic function is normal.  LV end diastolic pressure is normal.  RPDA lesion is 40% stenosed.   Widely patent stents in the LAD and circumflex vessel.  The LAD has 40% stenosis in a small diagonal branch arising just  distal to the stent in the mid LAD.  There is mild 20% mid LAD narrowing; complex vessel has smooth 20% narrowing proximal to the stented segment.  Small marginal branches arising within the circumflex stent had ostial narrowing of 40 and 30%; the  right coronary artery has moderate diffuse calcification with irregularity with narrowing of 20 to 30% in the mid vessel, 30% prior to PDA, and with no significant restenosis at the site of distal PTCA.  A small acute marginal branch has 70 to 80% ostial narrowing in a small caliber vessel.  LVEDP 17 - 23 MM hG.  RECOMMENDATION: The catheterization study is not significantly changed from the patient's last catheterization in July 2018.  Atherectomy sites with stenting in the LAD and circumflex vessel remain widely patent.  There is mild small branch ostial narrowings.  The RCA is calcified with patent PTCA site.  Medical therapy will be continued.  Continue long-term DAPT as the patient has been on aspirin/Plavix.  Continue aggressive lipid-lowering therapy with target LDL less than 70.  Exercise prescription.       IMPRESSION:  1. CAD in native artery: Status post multiple interventions   2. Hyperlipidemia with target LDL less than 70   3. Essential hypertension, benign   4. Grade I diastolic dysfunction   5. Abdominal aortic aneurysm (AAA) without rupture ([place])   6. Squamous cell carcinoma of vocal cord [facility])     ASSESSMENT AND PLAN: Nicholas Douglas is a young appearing 87 year old gentleman who has CAD dating back to 63 and had undergone multiple interventions over a10 year period from 32 to 1997. He developed unstable angina symptomatology in March 2015 and catheterization done initially at [facility] showed multivessel disease.  He underwent successful staged rotational coronary atherectomy initially involving the RCA in early March 2015 , and on 04/27/2013 repeat intervention was done to the left circumflex system with rotational  atherectomy.  At that time, he had a widely patent stent of his RCA, as well as a widely patent stent in his LAD.  He also had 60-70% mid LAD stenosis, which had not progressed. In 2017 he noticed a change in symptomatology with the development of more shortness of breath, and no energy and felt occasional episodes of vague chest discomfort with possible left arm radiation.  He was hospitalized in late April 2017 and was found to have progressive 99% distal RCA stenosis after the PDA takeoff.  His stents in the LAD and circumflex were widely patent.  There was mild concomitant CAD in the circumflex.  His RCA was diffusely diseased and calcified with narrowings of 30% of the mid segment, but there was an angulated area of narrowing calcified narrowing of 40% before the acute margin, which prevented a stent from getting beyond this.  He underwent successful PTCA  to the distal stenosis.  At catheterization in July 2018 his intervention sites were patent and there was no significant CAD progression. There was mild 30-35% narrowing in the circumflex and  mild 40% narrowing in the RCA.  The stents were patent as was the prior PTCA site.  He has had issues with shortness of breath.  ARB therapy had to be discontinued secondary to hyperkalemia, but at the time he was also having a fair amount of exogenous potassium intake.  Because of progressive symptoms of increasing shortness of breath and upper back discomfort which he believed may have been his anginal equivalent definitive catheterization was  performed in November 2020.  The LAD and circumflex stents were patent his distal RCA PTCA site was without restenosis.  He continues to be stable without recurrent anginal symptomatology or shortness of breath.  He had developed hoarseness in December and was found to have a friable lesion on his left vocal cord, biopsy positive for squamous cell carcinoma.  He has recently completed 33 radiation treatments.  His blood pressure  today is stable on his regimen consisting of amlodipine 10 mg, atenolol 12.5 mg twice a day, isosorbide 60 mg and spironolactone 12.5 mg daily.  He continues to be on Livalo 4 mg in addition to Zetia 10 mg for hyperlipidemia as well as Vascepa.  With his throat issues, he actually has been cutting the capsule and drinking the fluid since this is a large pill until his throat improves.  His ECG remained stable and shows sinus rhythm with no significant abnormalities.  I reviewed his most recent laboratory.  LDL is 69 on current treatment and triglycerides are borderline increased at 157.  With his throat issues his diet has not been as good difficulty with swallowing.  He will be having a follow-up visit with Dr. Donella Stade following his radiation treatments.  I will see him in 6 months for follow-up Cardiologic evaluation.  Laboratory will be obtained prior to that office visit.  He will contact me if recurrent symptoms develop in the interim.  He continues to be on DAPT and had held this for his biopsy.  He continues to be on finasteride for his prostate.   [name], MD, [facility]  06/20/2020 2:35 PM

## 2020-06-20 NOTE — Patient Instructions (Signed)
Medication Instructions:  Your physician recommends that you continue on your current medications as directed. Please refer to the Current Medication list given to you today.  *If you need a refill on your cardiac medications before your next appointment, please call your pharmacy*   Lab Work: CBC, CMet, Lipid, TSH to be drawn before 6 month follow up    Testing/Procedures: None ordered.    Follow-Up: At [facility], you and your health needs are our priority.  As part of our continuing mission to provide you with exceptional heart care, we have created designated Provider Care Teams.  These Care Teams include your primary Cardiologist (physician) and Advanced Practice Providers (APPs -  Physician Assistants and Nurse Practitioners) who all work together to provide you with the care you need, when you need it.  We recommend signing up for the patient portal called "MyChart".  Sign up information is provided on this After Visit Summary.  MyChart is used to connect with patients for Virtual Visits (Telemedicine).  Patients are able to view lab/test results, encounter notes, upcoming appointments, etc.  Non-urgent messages can be sent to your provider as well.   To learn more about what you can do with MyChart, go to [email or web address].    Your next appointment:   6 month(s)  The format for your next appointment:   In Person  Provider:   [name], MD

## 2020-07-03 ENCOUNTER — Encounter: Payer: Self-pay | Admitting: Radiation Oncology

## 2020-07-24 ENCOUNTER — Ambulatory Visit: Payer: Medicare Other | Admitting: Radiation Oncology

## 2020-07-25 ENCOUNTER — Other Ambulatory Visit: Payer: Self-pay

## 2020-07-25 ENCOUNTER — Ambulatory Visit
Admission: RE | Admit: 2020-07-25 | Discharge: 2020-07-25 | Disposition: A | Discharge status: Home / Self Care | Payer: Medicare Other | Source: Ambulatory Visit | Attending: Radiation Oncology | Admitting: Radiation Oncology

## 2020-07-25 VITALS — BP 145/80 | HR 59 | Temp 98.0°F | Resp 16 | Wt 186.0 lb

## 2020-07-25 DIAGNOSIS — Z923 Personal history of irradiation: Secondary | ICD-10-CM | POA: Insufficient documentation

## 2020-07-25 DIAGNOSIS — C329 Malignant neoplasm of larynx, unspecified: Secondary | ICD-10-CM | POA: Diagnosis not present

## 2020-07-25 NOTE — Progress Notes (Signed)
Radiation Oncology Follow up Note  Name: Nicholas Douglas   Date:   07/25/2020 MRN:  [id number] DOB: [birth year]    This 87 y.o. 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  He is having no dysphagia or head and neck pain at this time..  COMPLICATIONS OF TREATMENT: none  FOLLOW UP COMPLIANCE: keeps appointments   PHYSICAL EXAM:  BP (!) 145/80   Pulse (!) 59   Temp 98 F (36.7 C) (Tympanic)   Resp 16   Wt 186 lb (84.4 kg)   BMI 28.70 kg/m  Neck is clear without evidence of cervical or supraclavicular adenopathy.  Well-developed well-nourished patient in NAD. HEENT reveals PERLA, EOMI, discs not visualized.  Oral cavity is clear. No oral mucosal lesions are identified. Neck is clear without evidence of cervical or supraclavicular adenopathy. Lungs are clear to A&P. Cardiac examination is essentially unremarkable with regular rate and rhythm without murmur rub or thrill. Abdomen is benign with no organomegaly or masses noted. Motor sensory and DTR levels are equal and symmetric in the upper and lower extremities. Cranial nerves II through XII are grossly intact. Proprioception is intact. No peripheral adenopathy or edema is identified. No motor or sensory levels are noted. Crude visual fields are within normal range.  RADIOLOGY RESULTS: No current films for review  PLAN: Present time I have asked the patient reestablish follow-up care with ENT for at least monthly upper endoscopies at this time.  I have asked to see him back in 3 to 4 months for follow-up.  Do not see need for CT scan in the future since this was early stage laryngeal  cancer.  Patient and wife know to call with any concerns at any time.  I would like to take this opportunity to thank you for allowing me to participate in the care of your patient.Noreene Filbert, MD

## 2020-08-04 DIAGNOSIS — E875 Hyperkalemia: Secondary | ICD-10-CM

## 2020-08-08 ENCOUNTER — Other Ambulatory Visit: Payer: Self-pay | Admitting: *Deleted

## 2020-08-08 DIAGNOSIS — E875 Hyperkalemia: Secondary | ICD-10-CM

## 2020-08-16 LAB — BASIC METABOLIC PANEL
BUN/Creatinine Ratio: 17 (ref 10–24)
BUN: 18 mg/dL (ref 8–27)
CO2: 23 mmol/L (ref 20–29)
Calcium: 9.5 mg/dL (ref 8.6–10.2)
Chloride: 103 mmol/L (ref 96–106)
Creatinine, Ser: 1.09 mg/dL (ref 0.76–1.27)
Glucose: 102 mg/dL — ABNORMAL HIGH (ref 65–99)
Potassium: 4.7 mmol/L (ref 3.5–5.2)
Sodium: 139 mmol/L (ref 134–144)
eGFR: 66 mL/min/{1.73_m2} (ref >59)

## 2020-08-21 ENCOUNTER — Telehealth: Payer: Self-pay | Admitting: Cardiovascular Disease

## 2020-08-21 NOTE — Telephone Encounter (Signed)
Patient's wife is returning call to discuss lab results. 

## 2020-08-21 NOTE — Telephone Encounter (Signed)
Returned call to patients wife Mariann Laster (okay per DPR). Made patient's wife aware of the following results    [name], MD  08/20/2020  6:55 PM EDT      Minimal glucose elevation at 102.  Creatinine now improved and normal at 1.09.   Advised patients wife to call back with any issues, questions, or concerns. Mariann Laster verbalized understanding.

## 2020-09-12 ENCOUNTER — Other Ambulatory Visit: Payer: Self-pay | Admitting: Cardiovascular Disease

## 2020-11-23 ENCOUNTER — Encounter: Payer: Self-pay | Admitting: Radiation Oncology

## 2020-11-23 ENCOUNTER — Ambulatory Visit
Admission: RE | Admit: 2020-11-23 | Discharge: 2020-11-23 | Disposition: A | Discharge status: Home / Self Care | Payer: Medicare Other | Source: Ambulatory Visit | Attending: Radiation Oncology | Admitting: Radiation Oncology

## 2020-11-23 ENCOUNTER — Other Ambulatory Visit: Payer: Self-pay

## 2020-11-23 VITALS — BP 159/79 | HR 58 | Temp 98.1°F | Resp 20 | Wt 194.4 lb

## 2020-11-23 DIAGNOSIS — Z923 Personal history of irradiation: Secondary | ICD-10-CM | POA: Diagnosis not present

## 2020-11-23 DIAGNOSIS — C329 Malignant neoplasm of larynx, unspecified: Secondary | ICD-10-CM

## 2020-11-23 DIAGNOSIS — Z8521 Personal history of malignant neoplasm of larynx: Secondary | ICD-10-CM | POA: Insufficient documentation

## 2020-11-23 NOTE — Progress Notes (Signed)
Radiation Oncology Follow up Note  Name: Nicholas Douglas   Date:   11/23/2020 MRN:  [id number] DOB: 03-03-1931    This 87 y.o. 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  Seen today in routine follow-up he is doing fairly well specifically denies dysphagia head and neck pain.  He has been followed by ENT showing no evidence of disease.Marland Kitchen  He had a PET scan back in March showing no hypermetabolic activity in the larynx or any other areas of disease.  COMPLICATIONS OF TREATMENT: none  FOLLOW UP COMPLIANCE: keeps appointments   PHYSICAL EXAM:  BP (!) 159/79   Pulse (!) 58   Temp 98.1 F (36.7 C) (Tympanic)   Resp 20   Wt 194 lb 6.4 oz (88.2 kg)   BMI 30.00 kg/m  Neck is clear without evidence of cervical or supraclavicular adenopathy.  Patient does have lymphedema of his anterior neck.  Well-developed well-nourished patient in NAD. HEENT reveals PERLA, EOMI, discs not visualized.  Oral cavity is clear. No oral mucosal lesions are identified. Neck is clear without evidence of cervical or supraclavicular adenopathy. Lungs are clear to A&P. Cardiac examination is essentially unremarkable with regular rate and rhythm without murmur rub or thrill. Abdomen is benign with no organomegaly or masses noted. Motor sensory and DTR levels are equal and symmetric in the upper and lower extremities. Cranial nerves II through XII are grossly intact. Proprioception is intact. No peripheral adenopathy or edema is identified. No motor or sensory levels are noted. Crude visual fields are within normal range.  RADIOLOGY RESULTS: PET CT scan reviewed compatible with above-stated findings  PLAN: Present time patient is doing well  now out 5 months with no evidence of disease.  He has good tonal quality to his voice.  Still little raspy.  I have asked to see him back in 6 months for follow-up.  Patient knows to call with any concerns.  I would like to take this opportunity to thank you for allowing me to participate in the care of your patient.Noreene Filbert, MD

## 2020-12-06 ENCOUNTER — Other Ambulatory Visit: Payer: Self-pay | Admitting: Cardiovascular Disease

## 2020-12-20 LAB — COMPREHENSIVE METABOLIC PANEL
ALT: 34 IU/L (ref 0–44)
AST: 41 IU/L — ABNORMAL HIGH (ref 0–40)
Albumin/Globulin Ratio: 1.7 (ref 1.2–2.2)
Albumin: 4.3 g/dL (ref 3.6–4.6)
Alkaline Phosphatase: 65 IU/L (ref 44–121)
BUN/Creatinine Ratio: 16 (ref 10–24)
BUN: 18 mg/dL (ref 8–27)
Bilirubin Total: 0.8 mg/dL (ref 0.0–1.2)
CO2: 24 mmol/L (ref 20–29)
Calcium: 9.3 mg/dL (ref 8.6–10.2)
Chloride: 101 mmol/L (ref 96–106)
Creatinine, Ser: 1.15 mg/dL (ref 0.76–1.27)
Globulin, Total: 2.6 g/dL (ref 1.5–4.5)
Glucose: 105 mg/dL — ABNORMAL HIGH (ref 70–99)
Potassium: 4.4 mmol/L (ref 3.5–5.2)
Sodium: 141 mmol/L (ref 134–144)
Total Protein: 6.9 g/dL (ref 6.0–8.5)
eGFR: 62 mL/min/{1.73_m2} (ref >59)

## 2020-12-20 LAB — LIPID PANEL
Chol/HDL Ratio: 3.1 ratio (ref 0.0–5.0)
Cholesterol, Total: 147 mg/dL (ref 100–199)
HDL: 47 mg/dL (ref >39)
LDL Chol Calc (NIH): 79 mg/dL (ref 0–99)
Triglycerides: 118 mg/dL (ref 0–149)
VLDL Cholesterol Cal: 21 mg/dL (ref 5–40)

## 2020-12-20 LAB — CBC
Hematocrit: 46.8 % (ref 37.5–51.0)
Hemoglobin: 16.1 g/dL (ref 13.0–17.7)
MCH: 29.6 pg (ref 26.6–33.0)
MCHC: 34.4 g/dL (ref 31.5–35.7)
MCV: 86 fL (ref 79–97)
Platelets: 231 10*3/uL (ref 150–450)
RBC: 5.44 x10E6/uL (ref 4.14–5.80)
RDW: 12.7 % (ref 11.6–15.4)
WBC: 6.4 10*3/uL (ref 3.4–10.8)

## 2020-12-20 LAB — TSH: TSH: 6.28 u[IU]/mL — ABNORMAL HIGH (ref 0.450–4.500)

## 2021-01-04 ENCOUNTER — Encounter: Payer: Self-pay | Admitting: Cardiovascular Disease

## 2021-01-04 ENCOUNTER — Other Ambulatory Visit: Payer: Self-pay

## 2021-01-04 ENCOUNTER — Ambulatory Visit (INDEPENDENT_AMBULATORY_CARE_PROVIDER_SITE_OTHER): Payer: Medicare Other | Admitting: Cardiovascular Disease

## 2021-01-04 DIAGNOSIS — I10 Essential (primary) hypertension: Secondary | ICD-10-CM | POA: Diagnosis not present

## 2021-01-04 DIAGNOSIS — E785 Hyperlipidemia, unspecified: Secondary | ICD-10-CM | POA: Diagnosis not present

## 2021-01-04 DIAGNOSIS — I2510 Atherosclerotic heart disease of native coronary artery without angina pectoris: Secondary | ICD-10-CM | POA: Diagnosis not present

## 2021-01-04 DIAGNOSIS — C320 Malignant neoplasm of glottis: Secondary | ICD-10-CM

## 2021-01-04 DIAGNOSIS — R7989 Other specified abnormal findings of blood chemistry: Secondary | ICD-10-CM

## 2021-01-04 NOTE — Progress Notes (Signed)
Patient ID: Nicholas Douglas, male   DOB: 1932/01/11, 87 y.o.   MRN: [id number]     HPI: Nicholas Douglas is an 87 y.o. Caucasian male who presents to the office today for a 7 month cardiology evaluation.  [name] has known CAD and underwent numerous interventions dating back to 52, 1991, 1993, and in 1997. Cardiac catheterization in 2011 which showed preserved LV function with mild residual distal inferior apical hypocontractility. There is evidence for coronary calcification with segmental narrowing of his LAD of 30-40% proximally, 50% diffusely, in the midsegment, and 70-80% in the distal region, he had AV groove circumflex stenoses of 70 and 50% with a 40% OM 2 stenosis, and a 30-40 and 50% RCA stenoses with 80-90% stenosis in the acute marginal branch. He had been on medical therapy.  He presented to [facility] in March 2015 with class IV angina and catheterization demonstrated severe 2 vessel CAD with significant current calcification of the LAD with up to 95% mid LAD stenosis and diffuse proximal stenosis. He underwent successful high-speed rotational atherectomy the following day by Dr. Martinique and a 1.5 mm bur was used. Mid LAD was stented with a 2.75x38 mm Promus stent in the proximal LAD was stented with a 3.0x28 mm Promus stent. Medical therapy was recommended for his distal LAD stenosis. He has only one kidney and staged intervention to the left circumflex coronary artery was recommended.  He underwent staged left circumflex PCI on 04/27/2013 by me with successful high-speed rotational atherectomy with a 1.5 and 1.75 mm burr, and ultimately had a 2.5x28 mm Promus premier DES stent inserted into the circumflex vessel, which was post dilated to approximately 2.6 mm.  Subsequently he has felt significantly improved with resolution of any chest pain  He has a history of significant hyperlipidemia and has had significant increased number of small LDL particles and insulin resistance. This seemed to  markedly improve with the addition of Niaspan added to zetia and lovaza.  Remotely, he had been on statins consisting of Lipitor and subsequently Crestor. He did have transient LFT elevation. He also concerns of possible risk of developing dementia with statin therapy.  He had  some episodes of Niaspan induced diffuse flushing. He wanted to stop taking his Niaspan. He did have subsequent NMR off Niaspan and done just on Zetia and as well as 2 g of Lovaza.  This showed increased abnormalities such that his total cholesterol was 201, LDL had risen to 124, but he now had LDL small particles which have increased from 685 to 1348. Laboratory on 10/26/2012: LDL particle number was elevated at 1657, LDL cholesterol 112 triglycerides 155 and total cholesterol 190. He continued to be insulin resistant with insulin resistance scored 65. When I last saw him, we elected to try Livalo and ultimately titrated this to 4 mg daily to take in addition to his Zetia 10 mg. Laboratory on 02/22/2013: LDL particle number was  markedly improved at 774 with an HDL of 40 calculated LDL 50 small LDL particle #417. Insulin resistance score was also improved at 51.  In July 2015 he developed abdominal discomfort and was found to have acute appendicitis. On CT imaging he was also noted to have prostate enlargement with nodularity and thickening of the bladder base and cystoscopy was suggested for further evaluation. He is status post left nephrectomy. He also was noted to have a 2.3 cm infrarenal suprailiac abdominal aortic aneurysm.  He has been on Livalo 2 mg, Zetia 10  mg for hyperlipidemia.  Lab work on 12/22/2013 showed a cholesterol 155, triglycerides 130, HDL 45, LDL 84.  He has been on amlodipine 10 mg atenolol 12.5 mg twice a day and valsartan 80 mg daily for blood pressure.  He continues to be on aspirin and Plavix for dual antiplatelet therapy.  A nuclear perfusion study on 06/30/2014  revealed normal perfusion and function with an  ejection fraction of 57%.    When he underwent a CT scan for his appendicitis he was told of having a small abdominal aortic aneurysm.  His mother also had an abdominal aortic aneurysm.  The patient recently sprained his right ankle.  As result, he has not been able to be as active as he had in the past and has not been able to play golf which typically he had played up to 4 times per week, often scoring in the 70s.    He was admitted to the hospital in April 2017 with complaints of increasing episodes of chest discomfort.  Troponins were mildly positive at 0.12, consistent with a non-STEMI.  I performed cardiac catheterization on 05/28/2015.  This showed widely patent stents in the LAD and circumflex vessels.  The RCA was diffusely diseased with calcification in the proximal to mid region with distal 99% stenosis and a noncalcified distal RCA segment immediately after the PDA takeoff.  ECI was difficult due to the calcified RCA preventing placement of a stent since the stent was never able to be passed beyond this calcified angled segment despite even attempting guide liner support.  Ultimately, he underwent successful PTCA of the 99% stenosis, which was reduced to 0%.  Since his intervention he had noticed dramatic improvement in his previous symptomatology.  He continues to feel well and is playing golf 3 times per week. He had a basal cell cancer removed from his nose.   When I saw him in April 2018 he was without chest pain or significant shortness of breath and was playing golf at least 3 days per week.  Laboratory showed a total cholesterol 147, triglycerides 151, HDL 43, LDL 74 on regimen of Zetia 10 mg and Livalo 2 mg.  In the past he's been intolerant to Crestor, Lipitor, and Zocor.  I further titrated Livalo 24 mg daily which  he has tolerated. An abdominal ultrasound showed aortoiliac atherosclerosis without aneurysm.   When I saw him in June 2018 he had begun to notice increasing shortness of  breath with walking. He denies any of the chest tightness or pressure that he had experienced prior to his interventions.  At his last catheterization, he had extensive calcification in his RCA and PTCA alone was done since the stent was unable to reach the subtotal distal RCA.  He denies recurrent chest tightness.  He was questioning whether or not he could have had  pneumonia comtributing to his shortness of breath.  At that evaluation, I further titrated his Imdur to 60 mg daily.  His blood pressure was also elevated and I titrated his ARB therapy.  He sagittally saw Caro Hight as an add-on in with complaints of increasing symptomatology definitive cardiac catheterization was recommended.  He underwent repeat cardiac catheterization on 08/05/2016 by Dr. Peter Martinique.  This continues to show patency of the stents in the proximal to mid LAD and patency of the stented the mid circumflex as well as patent PTCA site of the PL OM vessel.  There was no new disease to explain his symptoms.  He had  normal LV function and normal left ventricular end-diastolic pressure.  Following the catheterization, he has felt well, but he continues to experience some shortness of breath with weakness and decreased energy.  He denies any chest tightness.  Recent laboratory done 10 days ago revealed an elevated potassium at 5.7.  On recheck 3 days later was 5.4.  Subsequent, he has been taken off ARB therapy.    In August 2018 he was complaining of significant fatigue.  His blood pressure was stable and his pulse was 55.  I recommended he reduce his atenolol from 12.5 mg twice a day to just 12.5 mg at bedtime.  I also reduced his isosorbide from 60 mg down to 30 mg in light of his recent catheterization findings.  His prior hypokalemia, resolved with discontinuance of ARB, but more importantly with discontinuance of his excessive exogenous potassium intake with certain foods.   When I saw him in October 2018 he was fairly  stable. Over the winter months he has had difficulty with his back and required injections.  This has limited his exercise.  He admits to gaining weight.  He has noticed that he is more short of breath with walking up hills.  Blood pressures at home often been in the 140-150 range.  He denies any chest pressure.  He is unaware of palpitations.  He had repeat lab work one week ago.  Renal function was stable with a creatinine 1.15.  Potassium was 4.7.  LFTs were normal.  Hemoglobin and hematocrit were stable.  Lipid studies were excellent.   When I saw him in April 2019 his blood pressure recordings at home were in the 130-150 range and he had experience some shortness of breath.  I suggested a trial of HCTZ 12.5 mg every other day.  He was tolerating livalo 4 mg, vascepa and Zetia for hyperlipidemia.  Over the past 6 months, he has been without recurrent anginal symptoms.  He continues to play golf at least 3 days/week and typically score is 75 or below.  He underwent repeat laboratory last week and lipid studies remain excellent with total cholesterol 124, LDL 57, triglycerides 82, HDL 51.  Shortness of breath has improved.  He denies any orthopnea.   When I saw him in October 2019 at which time he was feeling well and his shortness of breath had improved.  His blood pressure was excellent on a regimen consisting of amlodipine 10 mg, atenolol 12.5 mg at bedtime and he had not had any recent swelling.  He was no longer taking HCTZ.  He was playing golf 3 days/week.   He was evaluated in a telemedicine visit on Jun 17, 2018.  At that time he was remaining stable and denied any symptoms of chest pain PND orthopnea.  He is still playing golf at least 3 days/week and his highest score is 82 over the past year.  He does not routinely exercise on the days he does not play golf.  He states his blood pressures typically has been running in the 416L with diastolics in the 84T to 36I.  He denied any palpitations,  episodes of presyncope or syncope.   I saw him as an add-on in the office on December 21, 2018 after he had begun to notice more exertional dyspnea as well as upper back discomfort which in the past may have been his anginal equivalent.  With progressive symptoms repeat echocardiography and definitive cardiac catheterization was recommended.    An echo Doppler study  on December 30, 2018 showed an EF of 55 to 60%.  There was mild aortic sclerosis.  Diastolic dysfunction was indeterminate.  Previously had been noted to have grade 1 diastolic dysfunction on prior echocardiography.  He underwent successful catheterization on January 04, 2019 which showed widely patent stents in the LAD and circumflex vessel.  The LAD had 40% stenosis in a small diagonal branch arising just distal to the stent in the mid LAD.  There was mild 20% mid LAD stenosis.  The circumflex vessel had smooth 20% narrowing proximal to the proximal stent.  There was mild 40 and 30% narrowings and small marginal branches. The RCA had mild CAD of 20 and 30% with calcification and there was 70% stenosis in a small acute marginal branch ostially.  Medical therapy was recommended.  I saw him for initial follow-up of his cardiac catheterization on January 13, 2019 at which time he was doing well on medical therapy.  When I last saw him in April 2016 he remained stable.  He was playing golf 3 times per week and still scoring in the low to mid 70s. He continued to be on amlodipine 10 mg, isosorbide 60 mg a atenolol 12.5 mg twice a day in addition to spironolactone both for blood pressure and his CAD.  He continues to be on DAPT with aspirin/Plavix.  He tolerates Livalo in addition to [employer].    I saw him in October 2021 at which time he continued to remain stable.  He had undergone a CT angio of his chest with contrast by his pulmonologist which showed minimal bibasilar subsegmental atelectasis.  He was noted to have coronary artery calcification as  well as aortic atherosclerosis.  There was no evidence for PE.  He continues to play golf at least 3 days/week and his scores are consistently below 78.  He continues to be on DAPT but denies bleeding.  He is on Zetia 10 mg in addition to Livalo 4 mg and Vascepa 1 capsule twice a day.  Recent lipid studies showed a total cholesterol 152, HDL 52, LDL 78 and triglycerides 122 from November 23, 2019.  Renal function remained stable and he is continue to be on amlodipine 10 mg, isosorbide 60 mg, atenolol 12.5 mg twice a day and spironolactone 12.5 mg daily.  He has not had any anginal symptomatology or change in exercise capacity.    I last saw him on Jun 20, 2020 and since his prior evaluation he developed some hoarseness to his voice in December.  He subsequently underwent ENT evaluation by Dr. Clyde Canterbury and was found to have a friable 4 to 5 mm lesion of the left mid to anterior vocal cord.  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  Today he completed his 33rd and final radiation treatment by Dr. Donella Stade.  At his evaluation he denied any chest pain or shortness of breath.  He has been somewhat more fatigued with the radiation.  He recently underwent laboratory on Jun 12, 2020.  LDL cholesterol was improved at 69, total cholesterol 138, triglycerides were minimally increased at 157.  Creatinine was 1.30 with an estimated GFR at 53.  LFTs were normal.  TSH and CBC were normal.    Since I last saw him, he has continued to do well.  He continues to have a raspy voice with some improvement.  He denies chest pain or shortness of breath.  He did experience some mild throat swelling with radiation.  He does admit  to some fatigue.  He continues to be on amlodipine 10 mg, atenolol 12.5 mg twice a day, and isosorbide 60 mg daily.  He was recently told to hold his spironolactone by his primary physician.  He continues to be on Livalo 4 mg, Zetia 10 mg, and Vascepa 1 g twice a day for mixed hyperlipidemia.  Recent  laboratory on December 20, 2020 showed an AST of 41 and ALT of 34.  Hemoglobin hematocrit were stable at 16.1/46.8.  TSH had increased to 6.28 from 3.94 in May 2022.  He presents for evaluation.  Past Medical History:  Diagnosis Date   BPH (benign prostatic hyperplasia)    Followed by [name]/urology   CAD S/P PTCA only of RPAV-PL 05/31/2015   99% --> 0%PAV - PTCA only (unable to advance STENT) due to RCA calcification - prox & distal RCA 30%; Patent LAD stent & Cx stent (~20% ISR).    Calculus of kidney    "that's how they found the cancer" (04/27/2013)   Coronary artery disease involving native coronary artery of native heart with unstable angina pectoris ([place]) 12/03/2011   S/P Cardiac angioplasty 1986, 1991, 1993, 1997.  Last cardiac catheterization 2001.  Cardiolite 05/2011 low risk with normal EF 65%.  Followed by cardiology/Jacqu[facility] every six months.    Dyspnea    only on exertion   History of myocardial infarction 1976   Hypertension    Impotence of organic origin    Internal hemorrhoids without mention of complication    Malignant neoplasm of kidney, except pelvis    Melanoma ([place]) 06/2010   Left arm   Melanoma of back ([place]) 02/07/2015   R upper back; excision [facility].   Myocardial infarction [facility])    Other abnormal blood chemistry    Other nonspecific abnormal serum enzyme levels    Personal history of colonic polyps    Pure hypercholesterolemia    Tobacco use disorder    Unspecified congenital cystic kidney disease    Unstable angina ([place]) 04/05/2013; 05/2015    Past Surgical History:  Procedure Laterality Date   APPENDECTOMY  2014   CARDIAC CATHETERIZATION N/A 05/30/2015   Procedure: Left Heart Cath and Coronary Angiography;  Surgeon: [name], MD;  Location: [facility];  Service: Cardiovascular;  Laterality: N/A;   CARDIAC CATHETERIZATION N/A 05/30/2015   Procedure: Coronary Balloon Angioplasty;  Surgeon: [name], MD;  Location: [facility] [facility];   Service: Cardiovascular;  Laterality: N/A;   Cardiolite  05/06/2011   low risk study; normal EF.  [facility]   carotid dopplers  03/08/2011   minimal plaque formation B. Symptoms: dizziness.   COLONOSCOPY  10/05/2008   single polyp, [facility].  [name].  Repeat in 3 years.   CORONARY ANGIOPLASTY     "I've had 4" (04/27/2013)   CORONARY ANGIOPLASTY WITH STENT PLACEMENT  04/2013; 04/27/2013   "2 + 1" (04/27/2013)   EYE SURGERY  11/04/2013   Cataract surgery B; [facility].   LEFT HEART CATH AND CORONARY ANGIOGRAPHY N/A 08/05/2016   Procedure: Left Heart Cath and Coronary Angiography;  Surgeon: Martinique, Peter M, MD;  Location: [facility];  Service: Cardiovascular;  Laterality: N/A;   LEFT HEART CATH AND CORONARY ANGIOGRAPHY N/A 01/04/2019   Procedure: LEFT HEART CATH AND CORONARY ANGIOGRAPHY;  Surgeon: [name], MD;  Location: [facility];  Service: Cardiovascular;  Laterality: N/A;   MELANOMA EXCISION Left ~ 2007   "arm"   MELANOMA  EXCISION  02/07/2015   R upper back. [facility]   MICROLARYNGOSCOPY N/A 04/06/2020   Procedure: MICROLARYNGOSCOPY WITH BIOPSY OF LARYNX;  Surgeon: Clyde Canterbury, MD;  Location: [facility];  Service: ENT;  Laterality: N/A;   NEPHRECTOMY Left 2006   PERCUTANEOUS CORONARY ROTOBLATOR INTERVENTION (PCI-R) N/A 04/06/2013   Procedure: PERCUTANEOUS CORONARY ROTOBLATOR INTERVENTION (PCI-R);  Surgeon: [name], MD;  Location: [facility] C[facility] LAB;  Service: Cardiovascular;  Laterality: N/A;   PERCUTANEOUS CORONARY STENT INTERVENTION (PCI-S) N/A 04/27/2013   Procedure: PERCUTANEOUS CORONARY STENT INTERVENTION (PCI-S);  Surgeon: [name], MD;  Location: [facility] CATH LAB;  Service: Cardiovascular;  Laterality: N/A;    Allergies  Allergen Reactions   Angiotensin Receptor Blockers     Other reaction(s): Kidney Disorder Hyperkalemia   Lovastatin Other (See Comments)    Elevated liver enzymes   Morphine Hives   Nifedipine Other (See Comments)    Elevated liver enzymes    Current Outpatient  Medications  Medication Sig Dispense Refill   acetaminophen (TYLENOL) 500 MG tablet Take 500 mg by mouth every 6 (six) hours as needed (for pain.).      amLODipine (NORVASC) 10 MG tablet Take 1 tablet (10 mg total) by mouth every evening. 90 tablet 3   aspirin 81 MG tablet Take 81 mg by mouth every evening.     atenolol (TENORMIN) 25 MG tablet TAKE ONE-HALF (1/2) TABLET  BY MOUTH TWICE A DAY 90 tablet 3   Carboxymethylcell-Hypromellose (GENTEAL OP) Apply 1 drop to eye 2 (two) times daily.     clopidogrel (PLAVIX) 75 MG tablet TAKE 1 TABLET BY MOUTH  DAILY 90 tablet 3   ezetimibe (ZETIA) 10 MG tablet TAKE 1 TABLET BY MOUTH  DAILY 90 tablet 3   finasteride (PROSCAR) 5 MG tablet Take 1 tablet (5 mg total) by mouth daily. (Patient taking differently: Take 2.5 mg by mouth daily.) 90 tablet 3   isosorbide mononitrate (IMDUR) 60 MG 24 hr tablet TAKE 1 TABLET BY MOUTH  DAILY 90 tablet 3   nitroGLYCERIN (NITROSTAT) 0.4 MG SL tablet PLACE 1 TABLET (0.4 MG TOTAL) UNDER THE TONGUE EVERY 5 (FIVE) MINUTES AS NEEDED FOR CHEST PAIN. 25 tablet 11   Pitavastatin Calcium (LIVALO) 4 MG TABS Take 1 tablet (4 mg total) by mouth every evening. 90 tablet 3   sucralfate (CARAFATE) 1 g tablet Take 0.5 tablets (0.5 g total) by mouth 3 (three) times daily. Dissolve in 3-4 tbsp warm water, swish and swallow 60 tablet 1   traZODone (DESYREL) 50 MG tablet Take 50 mg by mouth at bedtime as needed for sleep.     VASCEPA 1 g capsule TAKE 1 CAPSULE BY MOUTH  TWICE DAILY 180 capsule 3   spironolactone (ALDACTONE) 25 MG tablet Take 0.5 tablets (12.5 mg total) by mouth daily. (Patient not taking: Reported on 01/04/2021) 45 tablet 3   No current facility-administered medications for this visit.    Socially he remains active. He is married has 5 children 10 grandchildren 2 great-grandchildren. Is no alcohol use. He typically scores below 75 and plays golf 3 days per week.  He typically scores in the 70s, better than his  age.  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 Clyde Canterbury Endocrine: Negative; no heat/cold intolerance; no diabetes Neuro: Negative; no changes in balance, headaches Skin: Negative; No rashes or skin lesions Psychiatric: Negative; No behavioral problems, depression Sleep: Negative; No snoring, daytime sleepiness, hypersomnolence, bruxism, restless legs, hypnogognic hallucinations, no cataplexy Other comprehensive 14 point system review is negative.   PE BP (!) 146/80 (BP Location: Right Arm, Patient Position: Sitting, Cuff Size: Normal)   Pulse (!) 58   Ht 5' 7.5" (1.715 m)   Wt 194 lb 3.2 oz (88.1 kg)   SpO2 97%   BMI 29.97 kg/m    Repeat blood pressure by me was 126/78  Wt Readings from Last 3 Encounters:  01/04/21 194 lb 3.2 oz (88.1 kg)  11/23/20 194 lb 6.4 oz (88.2 kg)  07/25/20 186 lb (84.4 kg)   General: Alert, oriented, no distress.  Raspy voice Skin: normal turgor, no rashes, warm and dry HEENT: Normocephalic, atraumatic. Pupils equal round and reactive to light; sclera anicteric; extraocular muscles intact; Nose without nasal septal hypertrophy Mouth/Parynx benign; Mallinpatti scale 3 Neck: No JVD, no carotid bruits; normal carotid upstroke Lungs: clear to ausculatation and percussion; no wheezing or rales Chest wall: without tenderness to palpitation Heart: PMI not displaced, RRR, s1 s2 normal, 1/6 systolic murmur, no diastolic murmur, no rubs, gallops, thrills, or heaves Abdomen: soft,  nontender; no hepatosplenomehaly, BS+; abdominal aorta nontender and not dilated by palpation. Back: no CVA tenderness Pulses 2+ Musculoskeletal: full range of motion, normal strength, no joint deformities Extremities: no clubbing cyanosis or edema, Homan's sign negative  Neurologic: grossly nonfocal; Cranial nerves grossly wnl Psychologic: Normal mood and affect   January 04, 2021 ECG (independently read by me): Sinu bradycardia at 58; NSSTT changes, no ectopy   Jun 20, 2020 ECG (independently read by me): Normal sinus rhythm at 62 bpm.  No ectopy.  Normal intervals    October 2021 ECG (independently read by me):  Sinus bradycardia at 56; no ectopy, normal intervals    May 21, 2019 ECG (independently read by me): Sinus bradycardia 54 bpm.  PR interval 188 ms, QTc interval 421 ms.  No ectopy.  January 13, 2019 ECG (independently read by me): Sinus bradycardia 54 bpm.  No ectopy.  PR interval 192 ms, QTc interval 4 3 ms  December 21, 2018 ECG (independently read by me): Normal sinus rhythm at 77 bpm.  Nonspecific ST changes.  Normal intervals  October 20119 ECG (independently read by me): Normal sinus rhythm at 61 bpm.  No ectopy.  Normal intervals.  April 2019 ECG (independently read by me):normal sinus rhythm at 61 bpm.  No ectopy.  No ST segment changes.  October 2018 ECG (independently read by me): Sinus bradycardia 57 bpm.  No ST segment changes.  Normal intervals.  August 2018 ECG (independently read by me): Sinus bradycardia 55 bpm.  Normal intervals.  No significant ST-T changes  June 2018 ECG (independently read by me): Sinus bradycardia 58 bpm.  Normal intervals.  No significant ST-T changes.  April 2018 ECG (independently read by me): Normal sinus rhythm at 63 bpm.  Normal intervals.  No ST segment changes.  September 2017 ECG (independently read by me): Normal sinus rhythm at 60 bpm.  Nonspecific T changes.  Intervals are normal.  May 2017 ECG (independently read by  me): Sinus bradycardia 55 bpm.  No ectopy.  Normal intervals.  Nondiagnostic T changes in lead 3.  April 2017 ECG (independently read by me): Sinus bradycardia 55 bpm.  No ectopy.  No significant ST changes.  Normal intervals.  01/12/2015 ECG (independently read by me): Sinus bradycardia 54 bpm.  No ectopy.  Normal intervals.  June 2016 ECG (independently read by me): Sinus bradycardia 58 bpm.  Normal intervals.  No ectopy. Non-specific T change aVL  November 2015 ECG (independently read by me): Sinus bradycardia 54 bpm.  No significant ST-T changes.  Normal intervals.  May 2015 ECG (independently read by me): Sinus bradycardia 54 beats per minute.  No ectopy.  QTc interval 396 ms.  No significant ST changes.  04/22/2013 ECG (independently read by me): Sinus bradycardia 55 beats per minute. Nonspecific ST changes  03/01/2013 ECG (independently read by me): Sinus bradycardia 52 beats per minute. Normal intervals. No significant ST changes.  Prior ECG of 11/20/2012: Sinus rhythm at 51 beats per minute. QTc interval 400 ms. No significant ST changes.  LABS:  BMP Latest Ref Rng & Units 12/20/2020 08/15/2020 06/12/2020  Glucose 70 - 99 mg/dL 105(H) 102(H) 110(H)  BUN 8 - 27 mg/dL $Remove'18 18 12  'kVgwwRn$ Creatinine 0.76 - 1.27 mg/dL 1.15 1.09 1.30(H)  BUN/Creat Ratio 10 - $Re'24 16 17 'Rif$ 9(L)  Sodium 134 - 144 mmol/L 141 139 138  Potassium 3.5 - 5.2 mmol/L 4.4 4.7 4.5  Chloride 96 - 106 mmol/L 101 103 100  CO2 20 - 29 mmol/L $RemoveB'24 23 22  'owGmZDNL$ Calcium 8.6 - 10.2 mg/dL 9.3 9.5 9.7   Hepatic Function Latest Ref Rng & Units 12/20/2020 06/12/2020 11/23/2019  Total Protein 6.0 - 8.5 g/dL 6.9 6.8 7.1  Albumin 3.6 - 4.6 g/dL 4.3 4.3 4.5  AST 0 - 40 IU/L 41(H) 32 46(H)  ALT 0 - 44 IU/L 34 30 43  Alk Phosphatase 44 - 121 IU/L 65 70 65  Total Bilirubin 0.0 - 1.2 mg/dL 0.8 0.9 0.7  Bilirubin, Direct 0.00 - 0.40 mg/dL - - -   CBC Latest Ref Rng & Units 12/20/2020 06/12/2020 06/12/2020  WBC 3.4 - 10.8 x10E3/uL 6.4 8.3 6.2   Hemoglobin 13.0 - 17.7 g/dL 16.1 16.2 16.0  Hematocrit 37.5 - 51.0 % 46.8 48.9 48.2  Platelets 150 - 450 x10E3/uL 231 240 249   Lab Results  Component Value Date   MCV 86 12/20/2020   MCV 88.7 06/12/2020   MCV 88 06/12/2020   Lab Results  Component Value Date   TSH 6.280 (H) 12/20/2020  .  Lipid Panel     Component Value Date/Time   CHOL 147 12/20/2020 0706   TRIG 118 12/20/2020 0706   TRIG 131 02/22/2013 0804   HDL 47 12/20/2020 0706   HDL 40 02/22/2013 0804   CHOLHDL 3.1 12/20/2020 0706   CHOLHDL 3.5 05/09/2015 1400   VLDL 31 (H) 05/09/2015 1400   LDLCALC 79 12/20/2020 0706   LDLCALC 50 02/22/2013 0804   RADIOLOGY: Dg Chest 2 View  08/17/2012   *RADIOLOGY REPORT*  Clinical Data: Renal cell carcinoma  CHEST - 2 VIEW  Comparison: 05/08/11  Findings: The cardiomediastinal silhouette is stable.  No acute infiltrate or pleural effusion.  No pulmonary edema.  Bony thorax is unremarkable.  IMPRESSION: No active disease.  No significant change.   Original Report Authenticated By: Lahoma Crocker, M.D.    CV STUDIES:  Echo 12/30/2018 Left ventricular ejection fraction, by visual estimation, is 55 to 60%. The left ventricle has normal function. There is no left ventricular hypertrophy. 2. Left ventricular diastolic parameters are indeterminate. 3.  The left ventricle has no regional wall motion abnormalities. 4. Global right ventricle has normal systolic function.The right ventricular size is normal. No increase in right ventricular wall thickness. 5. Left atrial size was mildly dilated. 6. Right atrial size was normal. 7. The mitral valve is normal in structure. No evidence of mitral valve regurgitation. No evidence of mitral stenosis. 8. The tricuspid valve is normal in structure. Tricuspid valve regurgitation is not demonstrated. 9. The aortic valve is normal in structure. Aortic valve regurgitation is trivial. Mild aortic valve sclerosis without stenosis. 10. The pulmonic  valve was grossly normal. Pulmonic valve regurgitation is not visualized. 11. The inferior vena cava is normal in size with greater than 50% respiratory variability, suggesting right atrial pressure of 3 mmHg.   CARDIAC CATHETERIZATION: 01/04/2019  Mid LM to Mid LAD lesion is 10% stenosed. 2nd Diag lesion is 40% stenosed. Mid LAD to Dist LAD lesion is 20% stenosed. Prox Cx to Mid Cx lesion is 5% stenosed. Ost Cx to Prox Cx lesion is 20% stenosed. 1st Mrg lesion is 40% stenosed. 2nd Mrg lesion is 30% stenosed. Ost RCA to Mid RCA lesion is 20% stenosed. Acute Mrg lesion is 70% stenosed. Dist RCA lesion is 30% stenosed. RPAV lesion is 20% stenosed. The left ventricular systolic function is normal. LV end diastolic pressure is normal. RPDA lesion is 40% stenosed.   Widely patent stents in the LAD and circumflex vessel.  The LAD has 40% stenosis in a small diagonal branch arising just distal to the stent in the mid LAD.  There is mild 20% mid LAD narrowing; complex vessel has smooth 20% narrowing proximal to the stented segment.  Small marginal branches arising within the circumflex stent had ostial narrowing of 40 and 30%; the  right coronary artery has moderate diffuse calcification with irregularity with narrowing of 20 to 30% in the mid vessel, 30% prior to PDA, and with no significant restenosis at the site of distal PTCA.  A small acute marginal branch has 70 to 80% ostial narrowing in a small caliber vessel.   LVEDP 17 - 23 MM hG.   RECOMMENDATION: The catheterization study is not significantly changed from the patient's last catheterization in July 2018.  Atherectomy sites with stenting in the LAD and circumflex vessel remain widely patent.  There is mild small branch ostial narrowings.  The RCA is calcified with patent PTCA site.  Medical therapy will be continued.  Continue long-term DAPT as the patient has been on aspirin/Plavix.  Continue aggressive lipid-lowering therapy with  target LDL less than 70.  Exercise prescription.        IMPRESSION:  1. CAD in native artery: Status post multiple interventions   2. Essential hypertension   3. 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 Elevated TSH     ASSESSMENT AND PLAN: Mr. Elizandro Laura is a young appearing 87 year old gentleman who has CAD dating back to 36 and had undergone multiple interventions over a10 year period from 83 to 1997. He developed unstable angina symptomatology in March 2015 and catheterization done initially at [facility] showed multivessel disease.  He underwent successful staged rotational coronary atherectomy initially involving the RCA in early March 2015 , and on 04/27/2013 repeat intervention was done to the left circumflex system with rotational atherectomy.  At that time, he had a widely patent stent of his RCA, as well as a widely patent stent in his LAD.  He also had 60-70% mid LAD stenosis, which had not progressed. In 2017 he noticed a change in symptomatology with the development of more shortness of breath, and no energy and felt occasional episodes of vague chest discomfort with possible left arm radiation.  He was hospitalized in late April 2017 and was found to have progressive 99% distal RCA stenosis after the PDA takeoff.  His stents in the LAD and circumflex were widely patent.  There was mild concomitant CAD in the circumflex.  His RCA was diffusely diseased and calcified with narrowings of 30% of the mid segment, but there was an angulated area of narrowing calcified narrowing of 40% before the acute margin, which prevented a stent from getting beyond this.  He underwent successful PTCA  to the distal stenosis.  At catheterization in July 2018 his intervention sites were patent and there was no significant CAD progression. There was mild 30-35% narrowing in the circumflex and mild 40% narrowing in the RCA.  The stents were patent as  was the prior PTCA site.  He has had issues with shortness of breath.  ARB therapy had to be discontinued secondary to hyperkalemia, but at the time he was also having a fair amount of exogenous potassium intake.  Because of progressive symptoms of increasing shortness of breath and upper back discomfort which he believed may have been his anginal equivalent definitive catheterization was  performed in November 2020.  The LAD and circumflex stents were patent his distal RCA PTCA site was without restenosis.  Presently he remains angina free on his current regimen consisting of amlodipine 10 mg, atenolol 12.5 mg twice a day and isosorbide mononitrate 60 mg daily.  He is no longer on DAPT since his biopsy but is on aspirin therapy.  Lipid studies are stable with total cholesterol 147 triglycerides 118, HDL 47, and LDL of 79.  He is on Livalo 4 mg, Vascepa 1 g twice a day and Zetia 10 mg daily.  He did not tolerate other statins.  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  He did note some throat swelling with radiation.  Recent laboratory now shows slight elevation of TSH which may be secondary to his radiation treatments.  I have suggested he follow-up with Dr. Emily Filbert and he may require initiation of low-dose levothyroxine but I will defer this to Dr. Sabra Heck.  He completed his radiation treatments with Dr. Donella Stade.  He continues to be on finasteride for his prostate.  I will see him in 6 months for reevaluation.   [name], MD, [facility]  01/13/2021 11:11 AM

## 2021-01-04 NOTE — Patient Instructions (Signed)
Medication Instructions:  The current medical regimen is effective;  continue present plan and medications.  *If you need a refill on your cardiac medications before your next appointment, please call your pharmacy*   Lab Work: CMET, CBC, TSH, LIPID (come back in 6 months, fasting- nothing to eat or drink)   If you have labs (blood work) drawn today and your tests are completely normal, you will receive your results only by: [place] (if you have MyChart) OR A paper copy in the mail If you have any lab test that is abnormal or we need to change your treatment, we will call you to review the results.  Follow-Up: At [facility], you and your health needs are our priority.  As part of our continuing mission to provide you with exceptional heart care, we have created designated Provider Care Teams.  These Care Teams include your primary Cardiologist (physician) and Advanced Practice Providers (APPs -  Physician Assistants and Nurse Practitioners) who all work together to provide you with the care you need, when you need it.  We recommend signing up for the patient portal called "MyChart".  Sign up information is provided on this After Visit Summary.  MyChart is used to connect with patients for Virtual Visits (Telemedicine).  Patients are able to view lab/test results, encounter notes, upcoming appointments, etc.  Non-urgent messages can be sent to your provider as well.   To learn more about what you can do with MyChart, go to [email or web address].    Your next appointment:   6 month(s)  The format for your next appointment:   In Person  Provider:   [name], MD

## 2021-01-13 ENCOUNTER — Encounter: Payer: Self-pay | Admitting: Cardiovascular Disease

## 2021-01-22 ENCOUNTER — Other Ambulatory Visit: Payer: Self-pay | Admitting: Cardiovascular Disease

## 2021-05-24 ENCOUNTER — Encounter: Payer: Self-pay | Admitting: Radiation Oncology

## 2021-05-24 ENCOUNTER — Ambulatory Visit
Admission: RE | Admit: 2021-05-24 | Discharge: 2021-05-24 | Disposition: A | Discharge status: Home / Self Care | Payer: Medicare Other | Source: Ambulatory Visit | Attending: Radiation Oncology | Admitting: Radiation Oncology

## 2021-05-24 VITALS — BP 175/74 | HR 57 | Resp 18 | Ht 68.0 in | Wt 195.3 lb

## 2021-05-24 DIAGNOSIS — Z8521 Personal history of malignant neoplasm of larynx: Secondary | ICD-10-CM | POA: Diagnosis present

## 2021-05-24 DIAGNOSIS — I890 Lymphedema, not elsewhere classified: Secondary | ICD-10-CM | POA: Insufficient documentation

## 2021-05-24 DIAGNOSIS — Z923 Personal history of irradiation: Secondary | ICD-10-CM | POA: Insufficient documentation

## 2021-05-24 DIAGNOSIS — C329 Malignant neoplasm of larynx, unspecified: Secondary | ICD-10-CM

## 2021-05-24 NOTE — Progress Notes (Signed)
Radiation Oncology ?Follow up Note ? ?Name: Nicholas Douglas   ?Date:   05/24/2021 ?MRN:  [id number] ?DOB: [birth year]  ? ? ?This 88 y.o. 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  He is doing well specifically denies any dysphagia or head and neck pain.  Does occasionally have some tenderness in his neck he does have lymphedema there from treatment.  He is under continuous follow-up with a ENT reporting no evidence of disease.. ? ?COMPLICATIONS OF TREATMENT: none ? ?FOLLOW UP COMPLIANCE: keeps appointments  ? ?PHYSICAL EXAM:  ?BP (!) 175/74   Pulse (!) 57   Resp 18   Ht '5\' 8"'$  (1.727 m)   Wt 195 lb 4.8 oz (88.6 kg)   BMI 29.70 kg/m?  ?Patient has no evidence of cervical or supraclavicular adenopathy does have some lymphedema in his anterior neck.  Well-developed well-nourished patient in NAD. HEENT reveals PERLA, EOMI, discs not visualized.  Oral cavity is clear. No oral mucosal lesions are identified. Neck is clear without evidence of cervical or supraclavicular adenopathy. Lungs are clear to A&P. Cardiac examination is essentially unremarkable with regular rate and rhythm without murmur rub or thrill. Abdomen is benign with no organomegaly or masses noted. Motor sensory and DTR levels are equal and symmetric in the upper and lower extremities. Cranial nerves II through XII are grossly intact. Proprioception is intact. No peripheral adenopathy or edema is identified. No motor or sensory levels are noted. Crude visual fields are within normal range. ? ?RADIOLOGY RESULTS: No current films for review ? ?PLAN: Present time patient is doing well with no evidence of disease now out close to 1 year.  I will see him back in 1  year for follow-up.  At that time I will discharge him from my follow-up care.  He continues close follow-up care with ENT.  Patient is to call with any concerns. ? ?I would like to take this opportunity to thank you for allowing me to participate in the care of your patient.. ?  ? GNoreene Filbert MD ? ?

## 2021-06-10 ENCOUNTER — Other Ambulatory Visit: Payer: Self-pay | Admitting: Cardiovascular Disease

## 2021-07-17 ENCOUNTER — Other Ambulatory Visit: Payer: Self-pay

## 2021-07-17 ENCOUNTER — Encounter: Payer: Self-pay | Admitting: Intensive Care

## 2021-07-17 ENCOUNTER — Observation Stay
Admission: EM | Admit: 2021-07-17 | Discharge: 2021-07-19 | Disposition: A | Discharge status: Home / Self Care | Payer: Medicare Other | Attending: Internal Medicine | Admitting: Internal Medicine

## 2021-07-17 ENCOUNTER — Telehealth: Payer: Self-pay | Admitting: Cardiovascular Disease

## 2021-07-17 ENCOUNTER — Emergency Department: Payer: Medicare Other

## 2021-07-17 DIAGNOSIS — N281 Cyst of kidney, acquired: Secondary | ICD-10-CM | POA: Diagnosis not present

## 2021-07-17 DIAGNOSIS — R079 Chest pain, unspecified: Secondary | ICD-10-CM | POA: Diagnosis present

## 2021-07-17 DIAGNOSIS — Z87891 Personal history of nicotine dependence: Secondary | ICD-10-CM | POA: Diagnosis not present

## 2021-07-17 DIAGNOSIS — I2510 Atherosclerotic heart disease of native coronary artery without angina pectoris: Secondary | ICD-10-CM | POA: Diagnosis not present

## 2021-07-17 DIAGNOSIS — I10 Essential (primary) hypertension: Secondary | ICD-10-CM | POA: Diagnosis present

## 2021-07-17 DIAGNOSIS — N179 Acute kidney failure, unspecified: Secondary | ICD-10-CM | POA: Diagnosis not present

## 2021-07-17 DIAGNOSIS — I714 Abdominal aortic aneurysm, without rupture, unspecified: Secondary | ICD-10-CM | POA: Diagnosis present

## 2021-07-17 DIAGNOSIS — R7302 Impaired glucose tolerance (oral): Secondary | ICD-10-CM | POA: Insufficient documentation

## 2021-07-17 DIAGNOSIS — Z85528 Personal history of other malignant neoplasm of kidney: Secondary | ICD-10-CM | POA: Insufficient documentation

## 2021-07-17 DIAGNOSIS — E785 Hyperlipidemia, unspecified: Secondary | ICD-10-CM | POA: Diagnosis present

## 2021-07-17 DIAGNOSIS — R0602 Shortness of breath: Secondary | ICD-10-CM | POA: Insufficient documentation

## 2021-07-17 DIAGNOSIS — Z79899 Other long term (current) drug therapy: Secondary | ICD-10-CM | POA: Insufficient documentation

## 2021-07-17 DIAGNOSIS — Z7982 Long term (current) use of aspirin: Secondary | ICD-10-CM | POA: Insufficient documentation

## 2021-07-17 DIAGNOSIS — Z955 Presence of coronary angioplasty implant and graft: Secondary | ICD-10-CM | POA: Diagnosis not present

## 2021-07-17 DIAGNOSIS — I25118 Atherosclerotic heart disease of native coronary artery with other forms of angina pectoris: Secondary | ICD-10-CM | POA: Insufficient documentation

## 2021-07-17 DIAGNOSIS — Z8521 Personal history of malignant neoplasm of larynx: Secondary | ICD-10-CM | POA: Insufficient documentation

## 2021-07-17 DIAGNOSIS — Z9861 Coronary angioplasty status: Secondary | ICD-10-CM

## 2021-07-17 DIAGNOSIS — R7309 Other abnormal glucose: Secondary | ICD-10-CM | POA: Diagnosis present

## 2021-07-17 DIAGNOSIS — R0789 Other chest pain: Principal | ICD-10-CM | POA: Insufficient documentation

## 2021-07-17 DIAGNOSIS — I208 Other forms of angina pectoris: Secondary | ICD-10-CM

## 2021-07-17 DIAGNOSIS — I2089 Other forms of angina pectoris: Secondary | ICD-10-CM

## 2021-07-17 DIAGNOSIS — I25119 Atherosclerotic heart disease of native coronary artery with unspecified angina pectoris: Secondary | ICD-10-CM | POA: Diagnosis present

## 2021-07-17 DIAGNOSIS — C649 Malignant neoplasm of unspecified kidney, except renal pelvis: Secondary | ICD-10-CM | POA: Diagnosis present

## 2021-07-17 DIAGNOSIS — Z7902 Long term (current) use of antithrombotics/antiplatelets: Secondary | ICD-10-CM | POA: Insufficient documentation

## 2021-07-17 DIAGNOSIS — Z905 Acquired absence of kidney: Secondary | ICD-10-CM

## 2021-07-17 DIAGNOSIS — Z85828 Personal history of other malignant neoplasm of skin: Secondary | ICD-10-CM | POA: Insufficient documentation

## 2021-07-17 DIAGNOSIS — R29818 Other symptoms and signs involving the nervous system: Secondary | ICD-10-CM | POA: Diagnosis present

## 2021-07-17 DIAGNOSIS — I200 Unstable angina: Secondary | ICD-10-CM | POA: Diagnosis present

## 2021-07-17 LAB — BASIC METABOLIC PANEL
Anion gap: 9 (ref 5–15)
BUN: 19 mg/dL (ref 8–23)
CO2: 27 mmol/L (ref 22–32)
Calcium: 9.2 mg/dL (ref 8.9–10.3)
Chloride: 104 mmol/L (ref 98–111)
Creatinine, Ser: 1.35 mg/dL — ABNORMAL HIGH (ref 0.61–1.24)
GFR, Estimated: 50 mL/min — ABNORMAL LOW (ref >60)
Glucose, Bld: 144 mg/dL — ABNORMAL HIGH (ref 70–99)
Potassium: 4.5 mmol/L (ref 3.5–5.1)
Sodium: 140 mmol/L (ref 135–145)

## 2021-07-17 LAB — CBC
HCT: 49.3 % (ref 39.0–52.0)
Hemoglobin: 16 g/dL (ref 13.0–17.0)
MCH: 28.7 pg (ref 26.0–34.0)
MCHC: 32.5 g/dL (ref 30.0–36.0)
MCV: 88.4 fL (ref 80.0–100.0)
Platelets: 215 10*3/uL (ref 150–400)
RBC: 5.58 MIL/uL (ref 4.22–5.81)
RDW: 12.8 % (ref 11.5–15.5)
WBC: 6.7 10*3/uL (ref 4.0–10.5)
nRBC: 0 % (ref 0.0–0.2)

## 2021-07-17 LAB — PROTIME-INR
INR: 1 (ref 0.8–1.2)
Prothrombin Time: 13.1 seconds (ref 11.4–15.2)

## 2021-07-17 LAB — HEMOGLOBIN A1C
Hgb A1c MFr Bld: 5.7 % — ABNORMAL HIGH (ref 4.8–5.6)
Mean Plasma Glucose: 116.89 mg/dL

## 2021-07-17 LAB — TROPONIN I (HIGH SENSITIVITY)
Troponin I (High Sensitivity): 8 ng/L (ref <18)
Troponin I (High Sensitivity): 7 ng/L (ref <18)

## 2021-07-17 LAB — BRAIN NATRIURETIC PEPTIDE: B Natriuretic Peptide: 138 pg/mL — ABNORMAL HIGH (ref 0.0–100.0)

## 2021-07-17 LAB — D-DIMER, QUANTITATIVE: D-Dimer, Quant: 0.41 ug/mL-FEU (ref 0.00–0.50)

## 2021-07-17 MED ORDER — EZETIMIBE 10 MG PO TABS
10.0000 mg | ORAL_TABLET | Freq: Every day | ORAL | Status: DC
  Administered 2021-07-18 – 2021-07-19 (×2): 10 mg via ORAL
  Filled 2021-07-17 (×2): qty 1

## 2021-07-17 MED ORDER — AMLODIPINE BESYLATE 10 MG PO TABS
10.0000 mg | ORAL_TABLET | Freq: Every evening | ORAL | Status: DC
  Administered 2021-07-17: 10 mg via ORAL
  Filled 2021-07-17: qty 1

## 2021-07-17 MED ORDER — TRAZODONE HCL 50 MG PO TABS
50.0000 mg | ORAL_TABLET | Freq: Every evening | ORAL | Status: DC | PRN
Start: 2021-07-17 — End: 2021-07-19

## 2021-07-17 MED ORDER — PRAVASTATIN SODIUM 20 MG PO TABS
80.0000 mg | ORAL_TABLET | Freq: Every day | ORAL | Status: DC
  Administered 2021-07-18: 80 mg via ORAL
  Filled 2021-07-17: qty 4

## 2021-07-17 MED ORDER — ASPIRIN 81 MG PO TBEC
81.0000 mg | DELAYED_RELEASE_TABLET | Freq: Every evening | ORAL | Status: DC
Start: 2021-07-17 — End: 2021-07-19
  Administered 2021-07-18: 81 mg via ORAL
  Filled 2021-07-17: qty 1

## 2021-07-17 MED ORDER — CLOPIDOGREL BISULFATE 75 MG PO TABS
75.0000 mg | ORAL_TABLET | Freq: Every day | ORAL | Status: DC
  Administered 2021-07-18 – 2021-07-19 (×2): 75 mg via ORAL
  Filled 2021-07-17 (×2): qty 1

## 2021-07-17 MED ORDER — HYDRALAZINE HCL 20 MG/ML IJ SOLN
10.0000 mg | Freq: Four times a day (QID) | INTRAMUSCULAR | Status: DC | PRN
  Administered 2021-07-17: 10 mg via INTRAVENOUS
  Filled 2021-07-17: qty 1

## 2021-07-17 MED ORDER — ASPIRIN 81 MG PO CHEW
324.0000 mg | CHEWABLE_TABLET | Freq: Once | ORAL | Status: AC
  Administered 2021-07-17: 324 mg via ORAL
  Filled 2021-07-17: qty 4

## 2021-07-17 MED ORDER — ACETAMINOPHEN 325 MG PO TABS
650.0000 mg | ORAL_TABLET | ORAL | Status: DC | PRN
Start: 2021-07-17 — End: 2021-07-19

## 2021-07-17 MED ORDER — NITROGLYCERIN 0.4 MG SL SUBL
0.4000 mg | SUBLINGUAL_TABLET | SUBLINGUAL | Status: DC | PRN
Start: 2021-07-17 — End: 2021-07-19

## 2021-07-17 MED ORDER — FINASTERIDE 5 MG PO TABS
5.0000 mg | ORAL_TABLET | Freq: Every day | ORAL | Status: DC
  Administered 2021-07-18 – 2021-07-19 (×2): 5 mg via ORAL
  Filled 2021-07-17 (×2): qty 1

## 2021-07-17 MED ORDER — HEPARIN SODIUM (PORCINE) 5000 UNIT/ML IJ SOLN
5000.0000 [IU] | Freq: Three times a day (TID) | INTRAMUSCULAR | Status: DC
  Administered 2021-07-17 – 2021-07-19 (×5): 5000 [IU] via SUBCUTANEOUS
  Filled 2021-07-17 (×5): qty 1

## 2021-07-17 MED ORDER — ISOSORBIDE MONONITRATE ER 60 MG PO TB24
60.0000 mg | ORAL_TABLET | Freq: Every day | ORAL | Status: DC
  Administered 2021-07-17 – 2021-07-18 (×2): 60 mg via ORAL
  Filled 2021-07-17 (×2): qty 1

## 2021-07-17 MED ORDER — ONDANSETRON HCL 4 MG/2ML IJ SOLN: 4.0000 mg | Freq: Four times a day (QID) | INTRAMUSCULAR | Status: DC | PRN

## 2021-07-17 MED ORDER — ATENOLOL 25 MG PO TABS
12.5000 mg | ORAL_TABLET | Freq: Two times a day (BID) | ORAL | Status: DC
Start: 2021-07-17 — End: 2021-07-18
  Administered 2021-07-18: 12.5 mg via ORAL
  Filled 2021-07-17 (×2): qty 0.5

## 2021-07-17 NOTE — H&P (Signed)
History and Physical    Patient: Nicholas Douglas GNF:[id number] DOB: 1931/02/14 DOA: 07/17/2021 DOS: the patient was seen and examined on 07/17/2021 PCP: Rusty Aus, MD  Patient coming from: Home  Chief Complaint:  Chief Complaint  Patient presents with   Chest Pain   HPI: Nicholas Douglas is a 88 y.o. male with medical history significant of CAD/ obesity and renal cancer s/p nephrectomy coming with few weeks of exertional sob and intermittent chest tightness that has gotten worse.   Chest Pain/ Chest tightness.  Duration: This week.  Frequency: Intermittent .  Location: Middle of chest.   Quality: Tightness.   Rate: 5/10.  Radiation: NR.  Aggravating: Activity.  Alleviating: None.   Associated factors: SOB.   Review of Systems  Respiratory:  Positive for shortness of breath.   Cardiovascular:  Positive for chest pain. Negative for leg swelling.  Gastrointestinal:  Positive for nausea.  All other systems reviewed and are negative.  Past Medical History:  Diagnosis Date   BPH (benign prostatic hyperplasia)    Followed by [name]/urology   CAD S/P PTCA only of RPAV-PL 05/31/2015   99% --> 0%PAV - PTCA only (unable to advance STENT) due to RCA calcification - prox & distal RCA 30%; Patent LAD stent & Cx stent (~20% ISR).    Calculus of kidney    "that's how they found the cancer" (04/27/2013)   Coronary artery disease involving native coronary artery of native heart with unstable angina pectoris ([place]) 12/03/2011   S/P Cardiac angioplasty 1986, 1991, 1993, 1997.  Last cardiac catheterization 2001.  Cardiolite 05/2011 low risk with normal EF 65%.  Followed by cardiology/[name] every six months.    Dyspnea    only on exertion   History of myocardial infarction 1976   Hypertension    Impotence of organic origin    Internal hemorrhoids without mention of complication    Malignant neoplasm of kidney, except pelvis    Melanoma ([place]) 06/2010   Left arm    Melanoma of back ([place]) 02/07/2015   R upper back; excision [facility].   Myocardial infarction [facility])    Other abnormal blood chemistry    Other nonspecific abnormal serum enzyme levels    Personal history of colonic polyps    Pure hypercholesterolemia    Tobacco use disorder    Unspecified congenital cystic kidney disease    Unstable angina ([place]) 04/05/2013; 05/2015   Past Surgical History:  Procedure Laterality Date   APPENDECTOMY  2014   CARDIAC CATHETERIZATION N/A 05/30/2015   Procedure: Left Heart Cath and Coronary Angiography;  Surgeon: [name], MD;  Location: [place] CV LAB;  Service: Cardiovascular;  Laterality: N/A;   CARDIAC CATHETERIZATION N/A 05/30/2015   Procedure: Coronary Balloon Angioplasty;  Surgeon: [name], MD;  Location: [place] CV LAB;  Service: Cardiovascular;  Laterality: N/A;   Cardiolite  05/06/2011   low risk study; normal EF.  [facility]   carotid dopplers  03/08/2011   minimal plaque formation B. Symptoms: dizziness.   COLONOSCOPY  10/05/2008   single polyp, IH.  [name].  Repeat in 3 years.   CORONARY ANGIOPLASTY     "I've had 4" (04/27/2013)   CORONARY ANGIOPLASTY WITH STENT PLACEMENT  04/2013; 04/27/2013   "2 + 1" (04/27/2013)   EYE SURGERY  11/04/2013   Cataract surgery B; [place].   LEFT HEART CATH AND CORONARY ANGIOGRAPHY N/A 08/05/2016   Procedure: Left Heart Cath and Coronary Angiography;  Surgeon: Martinique, Peter M, MD;  Location: [place] CV LAB;  Service: Cardiovascular;  Laterality: N/A;   LEFT HEART CATH AND CORONARY ANGIOGRAPHY N/A 01/04/2019   Procedure: LEFT HEART CATH AND CORONARY ANGIOGRAPHY;  Surgeon: [name], MD;  Location: [place] CV LAB;  Service: Cardiovascular;  Laterality: N/A;   MELANOMA EXCISION Left ~ 2007   "arm"   MELANOMA EXCISION  02/07/2015   R upper back. [facility]   MICROLARYNGOSCOPY N/A 04/06/2020   Procedure: MICROLARYNGOSCOPY WITH BIOPSY OF LARYNX;  Surgeon: Clyde Canterbury, MD;  Location: [facility] ORS;  Service: ENT;   Laterality: N/A;   NEPHRECTOMY Left 2006   PERCUTANEOUS CORONARY ROTOBLATOR INTERVENTION (PCI-R) N/A 04/06/2013   Procedure: PERCUTANEOUS CORONARY ROTOBLATOR INTERVENTION (PCI-R);  Surgeon: [name], MD;  Location[facility] CATH LAB;  Service: Cardiovascular;  Laterality: N/A;   PERCUTANEOUS CORONARY STENT INTERVENTION (PCI-S) N/A 04/27/2013   Procedure: PERCUTANEOUS CORONARY STENT INTERVENTION (PCI-S);  Surgeon: [name], MD;  Location[facility] CATH LAB;  Service: Cardiovascular;  Laterality: N/A;   Social History:  reports that he has quit smoking. His smoking use included cigars, pipe, and cigarettes. He has a 5.00 pack-year smoking history. He has quit using smokeless tobacco.  His smokeless tobacco use included chew. He reports current alcohol use of about 5.0 standard drinks of alcohol per week. He reports that he does not use drugs.  Allergies  Allergen Reactions   Angiotensin Receptor Blockers     Other reaction(s): Kidney Disorder Hyperkalemia   Lovastatin Other (See Comments)    Elevated liver enzymes   Morphine Hives   Nifedipine Other (See Comments)    Elevated liver enzymes    Family History  Problem Relation Age of Onset   Heart disease Mother        CAD, AAA.   AAA (abdominal aortic aneurysm) Mother     Prior to Admission medications   Medication Sig Start Date End Date Taking? Authorizing Provider  acetaminophen (TYLENOL) 500 MG tablet Take 500 mg by mouth every 6 (six) hours as needed (for pain.).     [provider]  amLODipine (NORVASC) 10 MG tablet TAKE 1 TABLET BY MOUTH IN  THE EVENING 01/22/21   [name], MD  aspirin 81 MG tablet Take 81 mg by mouth every evening.    [provider]  atenolol (TENORMIN) 25 MG tablet TAKE ONE-HALF (1/2) TABLET  BY MOUTH TWICE A DAY 12/06/20   [name], MD  Carboxymethylcell-Hypromellose (GENTEAL OP) Apply 1 drop to eye 2 (two) times daily.    [provider]  clopidogrel (PLAVIX) 75 MG  tablet TAKE 1 TABLET BY MOUTH  DAILY 09/12/20   [name], MD  ezetimibe (ZETIA) 10 MG tablet TAKE 1 TABLET BY MOUTH  DAILY 12/06/20   [name], MD  finasteride (PROSCAR) 5 MG tablet Take 1 tablet (5 mg total) by mouth daily. Patient taking differently: Take 2.5 mg by mouth daily. 12/22/13   Wardell Honour, MD  icosapent Ethyl (VASCEPA) 1 g capsule TAKE 1 CAPSULE BY MOUTH  TWICE DAILY 01/22/21   [name], MD  isosorbide mononitrate (IMDUR) 60 MG 24 hr tablet TAKE 1 TABLET BY MOUTH  DAILY 12/06/20   [name], MD  LIVALO 4 MG TABS TAKE 1 TABLET BY MOUTH IN  THE EVENING 06/11/21   [name], MD  nitroGLYCERIN (NITROSTAT) 0.4 MG SL tablet PLACE 1 TABLET (0.4 MG TOTAL) UNDER THE TONGUE EVERY  5 (FIVE) MINUTES AS NEEDED FOR CHEST PAIN. 11/26/16   Lyda Jester M, PA-C  spironolactone (ALDACTONE) 25 MG tablet Take 0.5 tablets (12.5 mg total) by mouth daily. Patient not taking: Reported on 01/04/2021 12/02/19 11/26/20  [name], MD  traZODone (DESYREL) 50 MG tablet Take 50 mg by mouth at bedtime as needed for sleep. 06/17/19   [provider]    Physical Exam: Vitals:   07/17/21 1726 07/17/21 1728 07/17/21 1800 07/17/21 1832  BP: (!) 128/109  (!) 183/75 (!) 188/80  Pulse:  60 65 68  Resp:  20 (!) 23 12  Temp:      TempSrc:      SpO2:  95% 95% 94%  Weight:      Height:      Physical Exam Vitals and nursing note reviewed.  Constitutional:      General: He is not in acute distress.    Appearance: Normal appearance. He is not ill-appearing, toxic-appearing or diaphoretic.  HENT:     Head: Normocephalic and atraumatic.     Right Ear: Hearing and external ear normal.     Left Ear: Hearing and external ear normal.     Nose: Nose normal. No nasal deformity.     Mouth/Throat:     Lips: Pink.     Mouth: Mucous membranes are moist.     Comments: Cannot see tonsils or pharynx. Tongue midline.  Suspect OSA.  Eyes:     Extraocular Movements:  Extraocular movements intact.     Pupils: Pupils are equal, round, and reactive to light.  Neck:     Vascular: No carotid bruit.  Cardiovascular:     Rate and Rhythm: Normal rate and regular rhythm.     Pulses: Normal pulses.          Dorsalis pedis pulses are 2+ on the right side and 2+ on the left side.       Posterior tibial pulses are 2+ on the right side and 2+ on the left side.     Heart sounds: Normal heart sounds.  Pulmonary:     Effort: Pulmonary effort is normal.     Breath sounds: Normal breath sounds.  Abdominal:     General: Bowel sounds are normal. There is no distension.     Palpations: Abdomen is soft. There is no mass.     Tenderness: There is no abdominal tenderness. There is no guarding.     Hernia: No hernia is present.  Musculoskeletal:     Right lower leg: No edema.     Left lower leg: No edema.  Skin:    General: Skin is warm.  Neurological:     General: No focal deficit present.     Mental Status: He is alert and oriented to person, place, and time.     Cranial Nerves: Cranial nerves 2-12 are intact.     Motor: Motor function is intact.  Psychiatric:        Attention and Perception: Attention normal.        Mood and Affect: Mood normal.        Speech: Speech normal.        Behavior: Behavior normal. Behavior is cooperative.        Cognition and Memory: Cognition normal.    Data Reviewed: Results for orders placed or performed during the hospital encounter of 07/17/21 (from the past 24 hour(s))  Basic metabolic panel     Status: Abnormal   Collection Time: 07/17/21  3:15 PM  Result Value Ref Range   Sodium 140 135 - 145 mmol/L   Potassium 4.5 3.5 - 5.1 mmol/L   Chloride 104 98 - 111 mmol/L   CO2 27 22 - 32 mmol/L   Glucose, Bld 144 (H) 70 - 99 mg/dL   BUN 19 8 - 23 mg/dL   Creatinine, Ser 1.35 (H) 0.61 - 1.24 mg/dL   Calcium 9.2 8.9 - 10.3 mg/dL   GFR, Estimated 50 (L) >60 mL/min   Anion gap 9 5 - 15  CBC     Status: None   Collection Time:  07/17/21  3:15 PM  Result Value Ref Range   WBC 6.7 4.0 - 10.5 K/uL   RBC 5.58 4.22 - 5.81 MIL/uL   Hemoglobin 16.0 13.0 - 17.0 g/dL   HCT 49.3 39.0 - 52.0 %   MCV 88.4 80.0 - 100.0 fL   MCH 28.7 26.0 - 34.0 pg   MCHC 32.5 30.0 - 36.0 g/dL   RDW 12.8 11.5 - 15.5 %   Platelets 215 150 - 400 K/uL   nRBC 0.0 0.0 - 0.2 %  Troponin I (High Sensitivity)     Status: None   Collection Time: 07/17/21  3:15 PM  Result Value Ref Range   Troponin I (High Sensitivity) 8 <18 ng/L  Protime-INR (order if Patient is taking Coumadin / Warfarin)     Status: None   Collection Time: 07/17/21  3:15 PM  Result Value Ref Range   Prothrombin Time 13.1 11.4 - 15.2 seconds   INR 1.0 0.8 - 1.2  Troponin I (High Sensitivity)     Status: None   Collection Time: 07/17/21  5:43 PM  Result Value Ref Range   Troponin I (High Sensitivity) 7 <18 ng/L     Assessment and Plan: * Chest pain .  Unstable angina ([place]) .  Coronary artery disease involving native coronary artery of native heart with angina pectoris ([place]) .  CAD S/P PTCA only of RPAV-PL .  AKI (acute kidney injury) ([place]) .  Solitary kidney, acquired .  Renal cell carcinoma ([place]) .  Essential hypertension, benign .  Abdominal aortic aneurysm (AAA) ([place]) .  Elevated glucose .  Suspected sleep apnea .   >>Chest pain/ UA/ CAD: Pt is a high risk for ischemia heart disease related chest pain.  We will admit to cardiac tele. [facility]   >>AKI/RCC S/P nephrectomy / Solitary Kidney: Lab Results  Component Value Date   CREATININE 1.35 (H) 07/17/2021   CREATININE 1.15 12/20/2020   CREATININE 1.09 08/15/2020   >>Essential HTN: Blood pressure (!) 188/80, pulse 68, temperature 97.7 F (36.5 C), temperature source Oral, resp. rate 12, height 5' 7.5" (1.715 m), weight 87.1 kg, SpO2 94 %. Home meds continued and PRN hydralazine.   >>AAA: Most recent US 02/10/2018, did not see any  AAA. FINDINGS: Abdominal aortic measurements as follows:   Proximal:  2.9 cm   Mid:  2.3 cm   Distal:  1.8 cm   Iliacs: Right common iliac artery measures up to 1.2 cm. Left common iliac artery measures up to 1.4 cm.   Other: IVC is patent.   IMPRESSION: Negative for an abdominal aortic aneurysm.    >>Elevated glulcose: Suspect pt has dm and we will obtain a1c and treat as deemed appropriate.   >>Suspected sleep apnea: - d/w pt he needs sleep study and to get it once he sees his PCP.    Advance  Care Planning:    Code Status: Full Code   Consults:  Cardiology Consult: Dr.Klein.   Family Communication:  Cordarro, Spinnato (Spouse)  [phone] ([place])  Severity of Illness: The appropriate patient status for this patient is OBSERVATION. Observation status is judged to be reasonable and necessary in order to provide the required intensity of service to ensure the patient's safety. The patient's presenting symptoms, physical exam findings, and initial radiographic and laboratory data in the context of their medical condition is felt to place them at decreased risk for further clinical deterioration. Furthermore, it is anticipated that the patient will be medically stable for discharge from the hospital within 2 midnights of admission.   Author: Para Skeans, MD 07/17/2021 8:28 PM  For on call review [email or web address].

## 2021-07-17 NOTE — Telephone Encounter (Signed)
Pt c/o Shortness Of Breath: STAT if SOB developed within the last 24 hours or pt is noticeably SOB on the phone  1. Are you currently SOB (can you hear that pt is SOB on the phone)? No  2. How long have you been experiencing SOB? Past 5-6 weeks, started worsening in past week   3. Are you SOB when sitting or when up moving around? Moving around   4. Are you currently experiencing any other symptoms? Extreme fatigue, chest tightness, occasional nausea, and pain in the back of neck    Pt c/o of Chest Pain: STAT if CP now or developed within 24 hours  1. Are you having CP right now? Yes, tightness  2. Are you experiencing any other symptoms (ex. SOB, nausea, vomiting, sweating)? No  3. How long have you been experiencing CP? Day or so   4. Is your CP continuous or coming and going? Continuous   5. Have you taken Nitroglycerin? Yes, took one this morning    BP is 158/92 HR 66. Transferring to triage due to being STAT. ?

## 2021-07-17 NOTE — ED Provider Notes (Signed)
[facility] Provider Note    Event Date/Time   First MD Initiated Contact with Patient 07/17/21 1723     (approximate)   History   Chest Pain   HPI  Nicholas Douglas is a 88 y.o. male with coronary disease, 1 kidney, hyperlipidemia who comes in with chest tightness off and on for the past 2 days.  He took a nitro at 11 AM.  Positive shortness of breath with exertion.  Patient reports he has had some intermittent exertional shortness of breath for the past few weeks however its gotten a lot worse over the past 2 days where he can hardly take a few steps without feeling short of breath and having to catch his breath.  He reports that once he is at rest the symptoms seem to resolve.  He reports that this is happened previously with his history of coronary disease and typically he needs a stent put in.  He denies any shortness of breath at rest, leg swelling, recent surgery.  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  He has had prior CT imaging to evaluate for pulmonary embolism that have been negative.  He is on Plavix.   Physical Exam   Triage Vital Signs: ED Triage Vitals  Enc Vitals Group     BP 07/17/21 1513 (!) 150/63     Pulse Rate 07/17/21 1513 66     Resp 07/17/21 1513 17     Temp 07/17/21 1513 97.7 F (36.5 C)     Temp Source 07/17/21 1513 Oral     SpO2 07/17/21 1513 97 %     Weight 07/17/21 1511 192 lb (87.1 kg)     Height 07/17/21 1511 5' 7.5" (1.715 m)     Head Circumference --      Peak Flow --      Pain Score 07/17/21 1511 4     Pain Loc --      Pain Edu? --      Excl. in [place]? --     Most recent vital signs: Vitals:   07/17/21 1513  BP: (!) 150/63  Pulse: 66  Resp: 17  Temp: 97.7 F (36.5 C)  SpO2: 97%     General: Awake, no distress.  CV:  Good peripheral perfusion.  Resp:  Normal effort.  Clear lungs Abd:  No distention.  Other:  No swelling in the legs.  No calf tenderness.   ED Results /  Procedures / Treatments   Labs (all labs ordered are listed, but only abnormal results are displayed) Labs Reviewed  BASIC METABOLIC PANEL - Abnormal; Notable for the following components:      Result Value   Glucose, Bld 144 (*)    Creatinine, Ser 1.35 (*)    GFR, Estimated 50 (*)    All other components within normal limits  SARS CORONAVIRUS 2 BY RT PCR  CBC  PROTIME-INR  URINALYSIS, ROUTINE W REFLEX MICROSCOPIC  TROPONIN I (HIGH SENSITIVITY)  TROPONIN I (HIGH SENSITIVITY)     EKG  My interpretation of EKG:  Normal sinus rate of 68 without any ST elevation with occasional PVC  RADIOLOGY I have reviewed the xray personally and agree with radiology read   PROCEDURES:  Critical Care performed: No  .1-3 Lead EKG Interpretation  Performed by: Vanessa Algodones, MD Authorized by: Vanessa [place], MD     Interpretation: abnormal     ECG rate:  60   ECG  rate assessment: normal     Rhythm: sinus rhythm     Ectopy: bigeminy and PVCs     Conduction: normal      MEDICATIONS ORDERED IN ED: Medications - No data to display   IMPRESSION / MDM / [place] / ED COURSE  I reviewed the triage vital signs and the nursing notes.   Patient's presentation is most consistent with acute presentation with potential threat to life or bodily function.  Differential includes angina, ACS.  We will proceed with EKG, cardiac markers, chest x-ray to evaluate for any pneumonia, pneumothorax.  BMP shows elevated creatinine to 1.35. CBC reassuring Troponin is negative  Patient is high risk for coronary disease.  He has intermittent bigeminy and PVCs on cardiac monitor.  We discussed close outpatient follow-up versus being admitted for cardiology consult for consideration of another catheterization versus medical management optimization.  Patient felt more comfortable with admission which I think is reasonable given his risk factors.  I considered CT PE to rule out pulmonary embolism  but he has had this done previously and this feels exactly like his coronary issues that he had in the past.  He is not hypoxic and he reports overall shortness of breath with exertion.  He has no evidence of DVT on examination.  Given he has only 1 kidney we will hold off on CTA at this time in case patient will get catheterization tomorrow.  However consider further work-up inpatient after cardiology consultation  Patient is currently chest pain-free so we will hold off on heparinization  The patient is on the cardiac monitor to evaluate for evidence of arrhythmia and/or significant heart rate changes.      FINAL CLINICAL IMPRESSION(S) / ED DIAGNOSES   Final diagnoses:  Stable angina ([place])     Rx / DC Orders   ED Discharge Orders     None        Note:  This document was prepared using Dragon voice recognition software and may include unintentional dictation errors.   Vanessa , MD 07/17/21 [phone]

## 2021-07-17 NOTE — ED Triage Notes (Signed)
Patient c/o sob, tightness in chest with discomfort in neck and back X5 weeks but has worsened within last week.

## 2021-07-17 NOTE — ED Notes (Signed)
Received report from [employer].

## 2021-07-17 NOTE — Telephone Encounter (Signed)
Spoke to wife.   2 identifiers obtained  Wife states  patient has been having chest tightness for 2 days off and on.  Patient is in the background answer patient at present time -  wife states  chest tightness . Patient said no chest pain,but tightness  Scale of 5/10.  Patient tried  NTG tablet @ 11 am today . No relief .  Positive shortness of breath with exertion , not diaphoresis no radiation of pain.  Per wife,she thinks medication maybe out of date.  Wife states patient did not have much symptoms with last   2017 procedure.   Per wife  patient has been tired and fatigue for the last month.   RN  informed  Wife - since patient is having active  chest tightness- recommendation to seek medical attention at the nearest hospital- contact  EMS for transport.  Wife wanted to know should they come to [facility] . RN asked where does the patient live. Wife states  [place].  RN  recommend the nearest hospital which would be [facility]  physician  available for consultation at [facility].  Wife verbalized understanding.

## 2021-07-18 ENCOUNTER — Observation Stay: Payer: Medicare Other

## 2021-07-18 ENCOUNTER — Observation Stay (HOSPITAL_BASED_OUTPATIENT_CLINIC_OR_DEPARTMENT_OTHER)
Admit: 2021-07-18 | Discharge: 2021-07-18 | Disposition: A | Discharge status: Home / Self Care | Payer: Medicare Other | Attending: Internal Medicine | Admitting: Internal Medicine

## 2021-07-18 DIAGNOSIS — I25119 Atherosclerotic heart disease of native coronary artery with unspecified angina pectoris: Secondary | ICD-10-CM | POA: Diagnosis not present

## 2021-07-18 DIAGNOSIS — R0602 Shortness of breath: Secondary | ICD-10-CM

## 2021-07-18 DIAGNOSIS — I10 Essential (primary) hypertension: Secondary | ICD-10-CM

## 2021-07-18 DIAGNOSIS — I2089 Other forms of angina pectoris: Secondary | ICD-10-CM

## 2021-07-18 DIAGNOSIS — I208 Other forms of angina pectoris: Secondary | ICD-10-CM

## 2021-07-18 DIAGNOSIS — I5031 Acute diastolic (congestive) heart failure: Secondary | ICD-10-CM | POA: Diagnosis not present

## 2021-07-18 DIAGNOSIS — R079 Chest pain, unspecified: Secondary | ICD-10-CM

## 2021-07-18 LAB — ECHOCARDIOGRAM COMPLETE
AR max vel: 2.68 cm2
AV Area VTI: 3.92 cm2
AV Area mean vel: 3.08 cm2
AV Mean grad: 1 mmHg
AV Peak grad: 2.6 mmHg
Ao pk vel: 0.8 m/s
Area-P 1/2: 4.49 cm2
Height: 67.5 in
MV VTI: 2.12 cm2
S' Lateral: 2.6 cm
Weight: 3072 oz

## 2021-07-18 LAB — URINALYSIS, COMPLETE (UACMP) WITH MICROSCOPIC
Bacteria, UA: NONE SEEN
Bilirubin Urine: NEGATIVE
Glucose, UA: NEGATIVE mg/dL
Hgb urine dipstick: NEGATIVE
Ketones, ur: NEGATIVE mg/dL
Leukocytes,Ua: NEGATIVE
Nitrite: NEGATIVE
Protein, ur: NEGATIVE mg/dL
Specific Gravity, Urine: 1.023 (ref 1.005–1.030)
Squamous Epithelial / HPF: NONE SEEN (ref 0–5)
pH: 5 (ref 5.0–8.0)

## 2021-07-18 LAB — BASIC METABOLIC PANEL
Anion gap: 5 (ref 5–15)
BUN: 18 mg/dL (ref 8–23)
CO2: 26 mmol/L (ref 22–32)
Calcium: 8.9 mg/dL (ref 8.9–10.3)
Chloride: 106 mmol/L (ref 98–111)
Creatinine, Ser: 1.02 mg/dL (ref 0.61–1.24)
GFR, Estimated: >60 mL/min (ref >60)
Glucose, Bld: 103 mg/dL — ABNORMAL HIGH (ref 70–99)
Potassium: 4.1 mmol/L (ref 3.5–5.1)
Sodium: 137 mmol/L (ref 135–145)

## 2021-07-18 MED ORDER — PERFLUTREN LIPID MICROSPHERE
1.0000 mL | INTRAVENOUS | Status: AC | PRN
  Administered 2021-07-18: 2 mL via INTRAVENOUS

## 2021-07-18 MED ORDER — FUROSEMIDE 10 MG/ML IJ SOLN
40.0000 mg | Freq: Once | INTRAMUSCULAR | Status: AC
  Administered 2021-07-18: 40 mg via INTRAVENOUS
  Filled 2021-07-18: qty 4

## 2021-07-18 MED ORDER — ATENOLOL 25 MG PO TABS
12.5000 mg | ORAL_TABLET | Freq: Every day | ORAL | Status: DC
  Administered 2021-07-19: 12.5 mg via ORAL
  Filled 2021-07-18: qty 0.5

## 2021-07-18 MED ORDER — ISOSORBIDE MONONITRATE ER 60 MG PO TB24
60.0000 mg | ORAL_TABLET | Freq: Two times a day (BID) | ORAL | Status: DC
  Administered 2021-07-18 – 2021-07-19 (×2): 60 mg via ORAL
  Filled 2021-07-18 (×2): qty 1

## 2021-07-18 MED ORDER — RANOLAZINE ER 500 MG PO TB12
500.0000 mg | ORAL_TABLET | Freq: Two times a day (BID) | ORAL | Status: DC
  Administered 2021-07-18 – 2021-07-19 (×2): 500 mg via ORAL
  Filled 2021-07-18 (×2): qty 1

## 2021-07-18 MED ORDER — AMLODIPINE BESYLATE 5 MG PO TABS
5.0000 mg | ORAL_TABLET | Freq: Every evening | ORAL | Status: DC
  Administered 2021-07-18: 5 mg via ORAL
  Filled 2021-07-18: qty 1

## 2021-07-18 NOTE — Progress Notes (Signed)
ECG performed and placed in patient's chart

## 2021-07-18 NOTE — Care Management Obs Status (Signed)
[facility] NOTIFICATION   Patient Details  Name: Nicholas Douglas MRN: [id number] Date of Birth: 1931-10-10   Medicare Observation Status Notification Given:  Yes    Candie Chroman, LCSW 07/18/2021, 2:21 PM

## 2021-07-18 NOTE — Progress Notes (Signed)
PROGRESS NOTE    Nicholas Douglas  UTM:[id number] DOB: 1931/04/04 DOA: 07/17/2021 PCP: Rusty Aus, MD    Brief Narrative:    88 y.o. male with medical history significant of CAD/ obesity and renal cancer s/p nephrectomy coming with few weeks of exertional sob and intermittent chest tightness that has gotten worse.    Patient has a extensive coronary history with 3 stents in place, multiple PCI, multiple angioplasties.  Was previously evaluated for CABG at [facility], presumably determined not to be an appropriate candidate.  Cardiology consulted.  Case discussed with Dr. Garen Lah.  Assessment & Plan:   Principal Problem:   Chest pain Active Problems:   Coronary artery disease involving native coronary artery of native heart with angina pectoris (HCC)   Unstable angina (HCC)   Hyperlipidemia   CAD S/P PTCA only of RPAV-PL   AKI (acute kidney injury) ([place])   Solitary kidney, acquired   Renal cell carcinoma (HCC)   Essential hypertension, benign   Abdominal aortic aneurysm (AAA) (HCC)   Elevated glucose   Suspected sleep apnea  Chest pain Shortness of breath, DOE Coronary artery disease Patient with extensive coronary history.  Status post multiple PCI with angioplasty and 3 stents in place.  Will be difficult to get complete resolution of pain.  Case discussed with cardiology.  Appreciate their recommendations. Currently high-sensitivity troponin negative and chest pain-free Plan: Imdur, dose increased to 60 mg twice daily Consider addition of Ranexa 500 twice daily Telemetry monitoring EKG as needed chest pain Ambulate as tolerated No indication for diuresis at this time 2D echocardiogram ordered, cardiology will follow PTA Plavix Possible heart catheterization  Essential hypertension Reasonable control over interval Atenolol 25 mg daily Amlodipine 5 mg daily As needed hydralazine  Hypercholesterolemia PTA Pravachol and Zetia   DVT prophylaxis: SQ  heparin Code Status: Full Family Communication: None today Disposition Plan: Status is: Observation The patient will require care spanning > 2 midnights and should be moved to inpatient because: CAD/unstable angina.  High risk.  Possible plan for inpatient ischemic evaluation including cardiac catheterization   Level of care: Telemetry Cardiac  Consultants:  Cardiology-[facility]  Procedures:  None  Antimicrobials: None   Subjective: Seen and examined.  Reports persistent chest pain and DOE  Objective: Vitals:   07/18/21 0500 07/18/21 0733 07/18/21 1206 07/18/21 1440  BP: 135/72 (!) 122/57 140/73 (!) 114/55  Pulse: 73 (!) 43 63 70  Resp: '20 20 16 19  '$ Temp: 97.9 F (36.6 C) 98.7 F (37.1 C) (!) 97.5 F (36.4 C) 98.1 F (36.7 C)  TempSrc: Oral Oral Oral   SpO2: 100% 98% 95% 93%  Weight:      Height:        Intake/Output Summary (Last 24 hours) at 07/18/2021 1542 Last data filed at 07/18/2021 1438 Gross per 24 hour  Intake 480 ml  Output 650 ml  Net -170 ml   Filed Weights   07/17/21 1511  Weight: 87.1 kg    Examination:  General exam: Appears calm and comfortable  Respiratory system: Clear to auscultation. Respiratory effort normal. Cardiovascular system: S1-S2, RRR, no murmurs, no pedal edema Gastrointestinal system: Abdomen is nondistended, soft and nontender. No organomegaly or masses felt. Normal bowel sounds heard. Central nervous system: Alert and oriented. No focal neurological deficits. Extremities: Symmetric 5 x 5 power. Skin: No rashes, lesions or ulcers Psychiatry: Judgement and insight appear normal. Mood & affect appropriate.     Data Reviewed: I have personally reviewed following  labs and imaging studies  CBC: Recent Labs  Lab 07/17/21 1515  WBC 6.7  HGB 16.0  HCT 49.3  MCV 88.4  PLT 710   Basic Metabolic Panel: Recent Labs  Lab 07/17/21 1515 07/18/21 0819  NA 140 137  K 4.5 4.1  CL 104 106  CO2 27 26  GLUCOSE 144* 103*  BUN 19  18  CREATININE 1.35* 1.02  CALCIUM 9.2 8.9   GFR: Estimated Creatinine Clearance: 53.2 mL/min (by C-G formula based on SCr of 1.02 mg/dL). Liver Function Tests: No results for input(s): "AST", "ALT", "ALKPHOS", "BILITOT", "PROT", "ALBUMIN" in the last 168 hours. No results for input(s): "LIPASE", "AMYLASE" in the last 168 hours. No results for input(s): "AMMONIA" in the last 168 hours. Coagulation Profile: Recent Labs  Lab 07/17/21 1515  INR 1.0   Cardiac Enzymes: No results for input(s): "CKTOTAL", "CKMB", "CKMBINDEX", "TROPONINI" in the last 168 hours. BNP (last 3 results) No results for input(s): "PROBNP" in the last 8760 hours. HbA1C: Recent Labs    07/17/21 1523  HGBA1C 5.7*   CBG: No results for input(s): "GLUCAP" in the last 168 hours. Lipid Profile: No results for input(s): "CHOL", "HDL", "LDLCALC", "TRIG", "CHOLHDL", "LDLDIRECT" in the last 72 hours. Thyroid Function Tests: No results for input(s): "TSH", "T4TOTAL", "FREET4", "T3FREE", "THYROIDAB" in the last 72 hours. Anemia Panel: No results for input(s): "VITAMINB12", "FOLATE", "FERRITIN", "TIBC", "IRON", "RETICCTPCT" in the last 72 hours. Sepsis Labs: No results for input(s): "PROCALCITON", "LATICACIDVEN" in the last 168 hours.  No results found for this or any previous visit (from the past 240 hour(s)).       Radiology Studies: US RENAL  Result Date: 07/18/2021 CLINICAL DATA:  Acute renal insufficiency. History of left nephrectomy. EXAM: RENAL / URINARY TRACT ULTRASOUND COMPLETE COMPARISON:  PET-CT 04/18/2020 FINDINGS: Right Kidney: Renal measurements: 13.5 x 6.9 x 6.5 cm = volume: 313.9 mL. Normal renal cortical thickness and echogenicity without focal lesions or hydronephrosis. Left Kidney: Surgically absent. Bladder: Appears normal for degree of bladder distention. Other: None. IMPRESSION: 1. Status post left nephrectomy. 2. Normal sonographic appearance of the right kidney. Electronically Signed   By:  Marijo Sanes M.D.   On: 07/18/2021 09:12   DG Chest 2 View  Result Date: 07/17/2021 CLINICAL DATA:  Worsening shortness of breath for several weeks. Chest pain. EXAM: CHEST - 2 VIEW COMPARISON:  07/22/2016 FINDINGS: The heart size and mediastinal contours are within normal limits. Low lung volumes are again noted. Both lungs remain clear. IMPRESSION: Low lung volumes. No active cardiopulmonary disease. Electronically Signed   By: Marlaine Hind M.D.   On: 07/17/2021 15:42        Scheduled Meds:  amLODipine  5 mg Oral QPM   aspirin EC  81 mg Oral QPM   [START ON 07/19/2021] atenolol  12.5 mg Oral Daily   clopidogrel  75 mg Oral Daily   ezetimibe  10 mg Oral Daily   finasteride  5 mg Oral Daily   heparin  5,000 Units Subcutaneous Q8H   isosorbide mononitrate  60 mg Oral BID   pravastatin  80 mg Oral q1800   Continuous Infusions:   LOS: 0 days       Sidney Ace, MD [facility]   If 7PM-7AM, please contact night-coverage  07/18/2021, 3:42 PM

## 2021-07-18 NOTE — Consult Note (Signed)
Cardiology Consultation:   Patient ID: Nicholas Douglas MRN: [id number]; DOB: 02-20-1931  Admit date: 07/17/2021 Date of Consult: 07/18/2021  PCP:  Rusty Aus, MD   [facility] Providers Cardiologist:  [name], MD        Patient Profile:   Nicholas Douglas is a 88 y.o. male with a hx of extensive coronary artery disease, hyperlipidemia, BPH, renal call carcinoma s/p nephrectomy, essential hypertension, infrarenal suprailiac abdominal aortic aneurysm who is being seen 07/18/2021 for the evaluation of chest pain and exertional dyspnea at the request of Dr. Posey Pronto.  History of Present Illness:   Nicholas Douglas has an extensive history of known coronary artery disease and underwent numerous interventions dating back to 1987, 1991, 1993, in the 1997, cardiac catheterization in 2011 showed preserved LV function with mild residual distal inferior apical hypocontractility.  There was evidence for coronary calcification with segmental narrowing of the LAD of 30 to 40% proximally, 50% diffusely, and in the mid segment it was 70 to 80%, in the distal region, he had AV groove circumflex stenosis of 50% and with a 40% OM 2 stenosis and 30-40 and 50%, RCA stenosis with 80 to 90% stenosis and acute marginal branch.  In 04/2013 he had a catheterization that demonstrated severe two-vessel CAD with significant current calcification of the LAD up to 95% with mid LAD stenosis and diffuse proximal stenosis.  He underwent successful high-speed rotational arthrectomy and stent placement to the mid LAD with 2.75 x 38 mm Promus in the proximal LAD was stented with a 3.0 x 28 mm Promus stent.  Again in April 2017 he had complaints of increasing episodes of chest discomfort troponins were elevated he underwent left heart catheterization and RCA was diffusely diseased with calcification in the proximal to mid region with a distal 99% stenosis and a noncalcified distal RCA segment immediately after PDA takeoff.  He had successful  PTCA of the 99% stenosis which was reduced to 0%.  08/05/2016 he underwent a another left heart catheterization which showed patency of the stents to the proximal mid LAD and patency of the stented mid circumflex as well as a patent PTCA side of the PL OM vessel there was no new disease to explain his symptoms he had normal LV function and normal left ventricular end-diastolic pressure.  12/30/2018 he had a EF of 55 to 60% but started with complaints of chest discomfort and upper back discomfort which in the past had been an anginal lung equivalent for him he underwent a left heart catheterization again there was widely patent stents in the LAD and circumflex vessel the LAD had 40% stenosis in the small diagonal branch arising just distal to the stent to the mid LAD, there is mild 20% mid LAD stenosis, the circumflex vessel with smooth 20% narrowing proximal to the proximal stent.  There was mild to 40 and 30% narrowing since in the small marginal branches.  The RCA had mild CAD of 20 to 30% with calcification there was 70% stenosis in a small acute marginal branch ostially.  Medical therapy was recommended.  Nicholas Douglas presented to the [facility]  emergency department for further evaluation of chest pain and exertional dyspnea on exertion.  Unfortunately he had called Dr. Evette Georges office with complaints and wanted to be seen yesterday but was advised to report to the emergency department for further evaluation.  Positive for shortness of breath with exertion for the past few weeks however is gotten worse over the last 2  days where he can barely take a few steps walking from his living room to his bathroom without having to stop and catch his breath.  This is associated with chest discomfort and pressure.  He denies any radiation denies any aggravating factors other than activity or alleviating factors other than rest.  He denies any other symptoms and stating until this started he was still playing gold three times per  week. Deneis any sick contacts or travel.  Initial vital signs: Blood pressure 150/63, pulse 66, respirations 17, temperature 97.7  Pertinent labs: Sodium of 140, potassium 4.5, blood glucose of 144, creatinine 1.35, BUN of 19, GFR 50, WBC 6.7, hemoglobin 16.0, hematocrit 49.3, platelets of 215, high-sensitivity troponin of 8 and 7, D-dimer 0.41, BNP of 138  Imaging: Chest x-ray revealed low lung volumes but no active cardiopulmonary disease   Past Medical History:  Diagnosis Date   BPH (benign prostatic hyperplasia)    Followed by [name]/urology   CAD S/P PTCA only of RPAV-PL 05/31/2015   99% --> 0%PAV - PTCA only (unable to advance STENT) due to RCA calcification - prox & distal RCA 30%; Patent LAD stent & Cx stent (~20% ISR).    Calculus of kidney    "that's how they found the cancer" (04/27/2013)   Coronary artery disease involving native coronary artery of native heart with unstable angina pectoris ([place]) 12/03/2011   S/P Cardiac angioplasty 1986, 1991, 1993, 1997.  Last cardiac catheterization 2001.  Cardiolite 05/2011 low risk with normal EF 65%.  Followed by cardiology/[name] every six months.    Dyspnea    only on exertion   History of myocardial infarction 1976   Hypertension    Impotence of organic origin    Internal hemorrhoids without mention of complication    Malignant neoplasm of kidney, except pelvis    Melanoma ([place]) 06/2010   Left arm   Melanoma of back ([place]) 02/07/2015   R upper back; excision [facility].   Myocardial infarction [facility])    Other abnormal blood chemistry    Other nonspecific abnormal serum enzyme levels    Personal history of colonic polyps    Pure hypercholesterolemia    Tobacco use disorder    Unspecified congenital cystic kidney disease    Unstable angina ([place]) 04/05/2013; 05/2015    Past Surgical History:  Procedure Laterality Date   APPENDECTOMY  2014   CARDIAC CATHETERIZATION N/A 05/30/2015   Procedure: Left Heart Cath and Coronary  Angiography;  Surgeon: [name], MD;  Location: [place] [facility];  Service: Cardiovascular;  Laterality: N/A;   CARDIAC CATHETERIZATION N/A 05/30/2015   Procedure: Coronary Balloon Angioplasty;  Surgeon: [name], MD;  Location: [place] [facility];  Service: Cardiovascular;  Laterality: N/A;   Cardiolite  05/06/2011   low risk study; normal EF.  [facility]   carotid dopplers  03/08/2011   minimal plaque formation B. Symptoms: dizziness.   COLONOSCOPY  10/05/2008   single polyp, IH.  [name].  Repeat in 3 years.   CORONARY ANGIOPLASTY     "I've had 4" (04/27/2013)   CORONARY ANGIOPLASTY WITH STENT PLACEMENT  04/2013; 04/27/2013   "2 + 1" (04/27/2013)   EYE SURGERY  11/04/2013   Cataract surgery B; [place].   LEFT HEART CATH AND CORONARY ANGIOGRAPHY N/A 08/05/2016   Procedure: Left Heart Cath and Coronary Angiography;  Surgeon: Martinique, Peter M, MD;  Location: [place] [facility];  Service: Cardiovascular;  Laterality: N/A;   LEFT  HEART CATH AND CORONARY ANGIOGRAPHY N/A 01/04/2019   Procedure: LEFT HEART CATH AND CORONARY ANGIOGRAPHY;  Surgeon: [name], MD;  Location: [place] [facility];  Service: Cardiovascular;  Laterality: N/A;   MELANOMA EXCISION Left ~ 2007   "arm"   MELANOMA EXCISION  02/07/2015   R upper back. [facility]   MICROLARYNGOSCOPY N/A 04/06/2020   Procedure: MICROLARYNGOSCOPY WITH BIOPSY OF LARYNX;  Surgeon: Clyde Canterbury, MD;  Location: [facility] ORS;  Service: ENT;  Laterality: N/A;   NEPHRECTOMY Left 2006   PERCUTANEOUS CORONARY ROTOBLATOR INTERVENTION (PCI-R) N/A 04/06/2013   Procedure: PERCUTANEOUS CORONARY ROTOBLATOR INTERVENTION (PCI-R);  Surgeon: [name], MD;  Location: [facility] CATH LAB;  Service: Cardiovascular;  Laterality: N/A;   PERCUTANEOUS CORONARY STENT INTERVENTION (PCI-S) N/A 04/27/2013   Procedure: PERCUTANEOUS CORONARY STENT INTERVENTION (PCI-S);  Surgeon: [name], MD;  Location: [facility] CATH LAB;  Service: Cardiovascular;  Laterality: N/A;     Home Medications:   Prior to Admission medications   Medication Sig Start Date End Date Taking? Authorizing Provider  acetaminophen (TYLENOL) 500 MG tablet Take 500 mg by mouth every 6 (six) hours as needed (for pain.).    Yes [provider]  amLODipine (NORVASC) 10 MG tablet TAKE 1 TABLET BY MOUTH IN  THE EVENING 01/22/21  Yes [name], MD  aspirin 81 MG tablet Take 81 mg by mouth every evening.   Yes [provider]  atenolol (TENORMIN) 25 MG tablet TAKE ONE-HALF (1/2) TABLET  BY MOUTH TWICE A DAY 12/06/20  Yes [name], MD  clopidogrel (PLAVIX) 75 MG tablet TAKE 1 TABLET BY MOUTH  DAILY 09/12/20  Yes [name], MD  ezetimibe (ZETIA) 10 MG tablet TAKE 1 TABLET BY MOUTH  DAILY 12/06/20  Yes [name], MD  finasteride (PROSCAR) 5 MG tablet Take 1 tablet (5 mg total) by mouth daily. Patient taking differently: Take 2.5 mg by mouth daily. 12/22/13  Yes Wardell Honour, MD  isosorbide mononitrate (IMDUR) 60 MG 24 hr tablet TAKE 1 TABLET BY MOUTH  DAILY Patient taking differently: 30 mg in the morning and at bedtime. 12/06/20  Yes [name], MD  LIVALO 4 MG TABS TAKE 1 TABLET BY MOUTH IN  THE EVENING 06/11/21  Yes [name], MD  nitroGLYCERIN (NITROSTAT) 0.4 MG SL tablet PLACE 1 TABLET (0.4 MG TOTAL) UNDER THE TONGUE EVERY 5 (FIVE) MINUTES AS NEEDED FOR CHEST PAIN. 11/26/16  Yes Rosita Fire, Brittainy M, PA-C  Carboxymethylcell-Hypromellose (GENTEAL OP) Apply 1 drop to eye 2 (two) times daily. Patient not taking: Reported on 07/18/2021    [provider]  icosapent Ethyl (VASCEPA) 1 g capsule TAKE 1 CAPSULE BY MOUTH  TWICE DAILY 01/22/21   [name], MD  spironolactone (ALDACTONE) 25 MG tablet Take 0.5 tablets (12.5 mg total) by mouth daily. Patient not taking: Reported on 01/04/2021 12/02/19 11/26/20  [name], MD  traZODone (DESYREL) 50 MG tablet Take 50 mg by mouth at bedtime as needed for sleep. Patient not taking: Reported on 07/18/2021 06/17/19    [provider]    Inpatient Medications: Scheduled Meds:  amLODipine  5 mg Oral QPM   aspirin EC  81 mg Oral QPM   [START ON 07/19/2021] atenolol  12.5 mg Oral Daily   clopidogrel  75 mg Oral Daily   ezetimibe  10 mg Oral Daily   finasteride  5 mg Oral Daily   heparin  5,000 Units Subcutaneous Q8H  isosorbide mononitrate  60 mg Oral BID   pravastatin  80 mg Oral q1800   Continuous Infusions:  PRN Meds: acetaminophen, hydrALAZINE, nitroGLYCERIN, ondansetron (ZOFRAN) IV, traZODone  Allergies:    Allergies  Allergen Reactions   Angiotensin Receptor Blockers     Other reaction(s): Kidney Disorder Hyperkalemia   Lovastatin Other (See Comments)    Elevated liver enzymes   Morphine Hives   Nifedipine Other (See Comments)    Elevated liver enzymes    Social History:   Social History   Socioeconomic History   Marital status: Married    Spouse name: Not on file   Number of children: 3   Years of education: college   Highest education level: Not on file  Occupational History   Occupation: Ecologist: RETIRED    Comment: Personal assistant, [employer]  Tobacco Use   Smoking status: Former    Packs/day: 0.25    Years: 20.00    Total pack years: 5.00    Types: Cigars, Pipe, Cigarettes   Smokeless tobacco: Former    Types: Chew   Tobacco comments:    Smokes about 2 cigars two months   Vaping Use   Vaping Use: Never used  Substance and Sexual Activity   Alcohol use: Yes    Alcohol/week: 5.0 standard drinks of alcohol    Types: 5 Glasses of wine per week   Drug use: No   Sexual activity: Yes  Other Topics Concern   Not on file  Social History Narrative   Marital status:  Married x 30 years; 2nd marriage      Children:  3 children, 2 step-children and 10 grandchildren.        Lives:  Lives with wife.         Employment:  Retired; Environmental education officer      Tobacco:  Former user; chews tobacco.      Alcohol:  5 glasses of wine per week       Drugs:   No drugs.        Exercise: 5 x week Light; plays golf 5 days per week but rides golf cart, walking 1 mile.         Advanced Directives:  Patient DOES NOT have living will; desires DNR/DNI.       ADLS: independent with all ADLs; drives.  No falls.  Does not use assistant devices with ambulation.     Social Determinants of Health   Financial Resource Strain: Not on file  Food Insecurity: Not on file  Transportation Needs: Not on file  Physical Activity: Not on file  Stress: Not on file  Social Connections: Not on file  Intimate Partner Violence: Not on file    Family History:    Family History  Problem Relation Age of Onset   Heart disease Mother        CAD, AAA.   AAA (abdominal aortic aneurysm) Mother      ROS:  Please see the history of present illness.  Review of Systems  Constitutional:  Positive for malaise/fatigue.  HENT: Negative.    Eyes: Negative.   Respiratory:  Positive for shortness of breath.   Cardiovascular:  Positive for chest pain.  Gastrointestinal:  Positive for heartburn and nausea.  Genitourinary: Negative.   Musculoskeletal: Negative.   Skin: Negative.   Neurological: Negative.   Endo/Heme/Allergies: Negative.   Psychiatric/Behavioral: Negative.      All other ROS reviewed and negative.  Physical Exam/Data:   Vitals:   07/18/21 0500 07/18/21 0733 07/18/21 1206 07/18/21 1440  BP: 135/72 (!) 122/57 140/73 (!) 114/55  Pulse: 73 (!) 43 63 70  Resp: '20 20 16 19  '$ Temp: 97.9 F (36.6 C) 98.7 F (37.1 C) (!) 97.5 F (36.4 C) 98.1 F (36.7 C)  TempSrc: Oral Oral Oral   SpO2: 100% 98% 95% 93%  Weight:      Height:        Intake/Output Summary (Last 24 hours) at 07/18/2021 1452 Last data filed at 07/18/2021 1438 Gross per 24 hour  Intake 480 ml  Output 650 ml  Net -170 ml      07/17/2021    3:11 PM 05/24/2021   10:26 AM 01/04/2021    8:27 AM  Last 3 Weights  Weight (lbs) 192 lb 195 lb 4.8 oz 194 lb 3.2 oz  Weight (kg) 87.091 kg 88.587  kg 88.089 kg     Body mass index is 29.63 kg/m.  General:  Well nourished, well developed, in no acute distress HEENT: normal, glasses on  Neck: no JVD appreciated Vascular: No carotid bruits; Distal pulses 2+ bilaterally Cardiac:  normal S1, S2; RRR; no murmur  Lungs:  clear to auscultation bilaterally, no wheezing, rhonchi or rales, respirations are unlabored at rest on room air Abd: soft, nontender, no hepatomegaly, obese, bowel sounds present in all 4 quadrants Ext: no edema Musculoskeletal:  No deformities, BUE and BLE strength normal and equal Skin: warm and dry  Neuro:  CNs 2-12 intact, no focal abnormalities noted Psych:  Normal affect   EKG:  The EKG was personally reviewed and demonstrates: Sinus rhythm with unifocal PVCs with nonspecific T wave abnormalities and LVH Telemetry:  Telemetry was personally reviewed and demonstrates: Sinus rhythm rate in the 70s with episodes of trigeminy  Relevant CV Studies: LHC completed 01/04/2019 Mid LM to Mid LAD lesion is 10% stenosed. 2nd Diag lesion is 40% stenosed. Mid LAD to Dist LAD lesion is 20% stenosed. Prox Cx to Mid Cx lesion is 5% stenosed. Ost Cx to Prox Cx lesion is 20% stenosed. 1st Mrg lesion is 40% stenosed. 2nd Mrg lesion is 30% stenosed. Ost RCA to Mid RCA lesion is 20% stenosed. Acute Mrg lesion is 70% stenosed. Dist RCA lesion is 30% stenosed. RPAV lesion is 20% stenosed. The left ventricular systolic function is normal. LV end diastolic pressure is normal. RPDA lesion is 40% stenosed.   Widely patent stents in the LAD and circumflex vessel.  The LAD has 40% stenosis in a small diagonal branch arising just distal to the stent in the mid LAD.  There is mild 20% mid LAD narrowing; complex vessel has smooth 20% narrowing proximal to the stented segment.  Small marginal branches arising within the circumflex stent had ostial narrowing of 40 and 30%; the  right coronary artery has moderate diffuse calcification with  irregularity with narrowing of 20 to 30% in the mid vessel, 30% prior to PDA, and with no significant restenosis at the site of distal PTCA.  A small acute marginal branch has 70 to 80% ostial narrowing in a small caliber vessel.  Laboratory Data:  High Sensitivity Troponin:   Recent Labs  Lab 07/17/21 1515 07/17/21 1743  TROPONINIHS 8 7     Chemistry Recent Labs  Lab 07/17/21 1515 07/18/21 0819  NA 140 137  K 4.5 4.1  CL 104 106  CO2 27 26  GLUCOSE 144* 103*  BUN 19 18  CREATININE 1.35*  1.02  CALCIUM 9.2 8.9  GFRNONAA 50* >60  ANIONGAP 9 5    No results for input(s): "PROT", "ALBUMIN", "AST", "ALT", "ALKPHOS", "BILITOT" in the last 168 hours. Lipids No results for input(s): "CHOL", "TRIG", "HDL", "LABVLDL", "LDLCALC", "CHOLHDL" in the last 168 hours.  Hematology Recent Labs  Lab 07/17/21 1515  WBC 6.7  RBC 5.58  HGB 16.0  HCT 49.3  MCV 88.4  MCH 28.7  MCHC 32.5  RDW 12.8  PLT 215   Thyroid No results for input(s): "TSH", "FREET4" in the last 168 hours.  BNP Recent Labs  Lab 07/17/21 1515  BNP 138.0*    DDimer  Recent Labs  Lab 07/17/21 1515  DDIMER 0.41     Radiology/Studies:  US RENAL  Result Date: 08-12-2021 CLINICAL DATA:  Acute renal insufficiency. History of left nephrectomy. EXAM: RENAL / URINARY TRACT ULTRASOUND COMPLETE COMPARISON:  PET-CT 04/18/2020 FINDINGS: Right Kidney: Renal measurements: 13.5 x 6.9 x 6.5 cm = volume: 313.9 mL. Normal renal cortical thickness and echogenicity without focal lesions or hydronephrosis. Left Kidney: Surgically absent. Bladder: Appears normal for degree of bladder distention. Other: None. IMPRESSION: 1. Status post left nephrectomy. 2. Normal sonographic appearance of the right kidney. Electronically Signed   By: Marijo Sanes M.D.   On: Aug 12, 2021 09:12   DG Chest 2 View  Result Date: 07/17/2021 CLINICAL DATA:  Worsening shortness of breath for several weeks. Chest pain. EXAM: CHEST - 2 VIEW COMPARISON:   07/22/2016 FINDINGS: The heart size and mediastinal contours are within normal limits. Low lung volumes are again noted. Both lungs remain clear. IMPRESSION: Low lung volumes. No active cardiopulmonary disease. Electronically Signed   By: Marlaine Hind M.D.   On: 07/17/2021 15:42     Assessment and Plan:   Chest pain/chest tightness -High sensitivity troponins trended 8 down to 7 -Currently chest pain-free and wrist -Imdur increased to 60 mg twice daily -If continued chest discomfort with increased Imdur can start Ranexa 500 mg twice daily -Continue cardiac monitor -EKG as needed for changes -Have patient ambulate in room and hallway to determine if exertional chest pain represents with medication changes  2.  Shortness of breath/dyspnea on exertion -Progressive worsening shortness of breath over the past several weeks, can be chest pain equivalent for this patient -BNP of 138 -Chest x-ray was clear, lungs are clear, respirations are unlabored, remains on room air satting well  3.  Essential hypertension -Blood pressures have been well controlled since admission -Atenolol decreased to 25 mg daily -Amlodipine decreased to 5 mg daily -As needed hydralazine -No signs per unit protocol  4.  Pure hypercholesterolemia -Continue Zetia -Continue Pravachol -LDL 62 on 01/25/2021  5.  Coronary artery disease involving native coronary artery of native heart with angina pectoris -Extensive coronary history status post PCI with stent placement in the past with last heart catheterization done in 2020 -Patient currently on Plavix -Requesting heart catheterization that can be scheduled for later this week  Risk Assessment/Risk Scores:     HEAR Score (for undifferentiated chest pain):     New York Heart Association (NYHA) Functional Class NYHA Class II        For questions or updates, please contact [facility] Please consult [email or web address] for contact info under    Signed, Cedarius Kersh, NP  Aug 12, 2021 2:52 PM

## 2021-07-18 NOTE — Plan of Care (Signed)
  Problem: Education: Goal: Understanding of cardiac disease, CV risk reduction, and recovery process will improve 07/18/2021 0542 by English Craighead, RN Outcome: Progressing 07/18/2021 0542 by Ashur Glatfelter, RN Outcome: Progressing Goal: Individualized Educational Video(s) 07/18/2021 0542 by Taariq Leitz, RN Outcome: Progressing 07/18/2021 0542 by Prima Rayner, RN Outcome: Progressing

## 2021-07-19 DIAGNOSIS — I25119 Atherosclerotic heart disease of native coronary artery with unspecified angina pectoris: Secondary | ICD-10-CM | POA: Diagnosis not present

## 2021-07-19 DIAGNOSIS — R5383 Other fatigue: Secondary | ICD-10-CM

## 2021-07-19 DIAGNOSIS — R29818 Other symptoms and signs involving the nervous system: Secondary | ICD-10-CM | POA: Diagnosis not present

## 2021-07-19 DIAGNOSIS — I208 Other forms of angina pectoris: Secondary | ICD-10-CM

## 2021-07-19 DIAGNOSIS — R0602 Shortness of breath: Secondary | ICD-10-CM | POA: Diagnosis not present

## 2021-07-19 MED ORDER — AMLODIPINE BESYLATE 5 MG PO TABS: 5.0000 mg | ORAL_TABLET | Freq: Every evening | ORAL | 0 refills | Status: DC

## 2021-07-19 MED ORDER — FUROSEMIDE 20 MG PO TABS: 20.0000 mg | ORAL_TABLET | Freq: Every day | ORAL | 0 refills | Status: DC

## 2021-07-19 MED ORDER — RANOLAZINE ER 500 MG PO TB12: 500.0000 mg | ORAL_TABLET | Freq: Two times a day (BID) | ORAL | 0 refills | Status: DC

## 2021-07-19 MED ORDER — ISOSORBIDE MONONITRATE ER 60 MG PO TB24: 60.0000 mg | ORAL_TABLET | Freq: Two times a day (BID) | ORAL | 0 refills | Status: DC

## 2021-07-19 MED ORDER — FUROSEMIDE 20 MG PO TABS: 20.0000 mg | ORAL_TABLET | ORAL | 1 refills | Status: DC

## 2021-07-19 NOTE — Discharge Summary (Signed)
Physician Discharge Summary  Nicholas Douglas SAY:[id number] DOB: 23-Jun-1931 DOA: 07/17/2021  PCP: Nicholas Aus, MD  Admit date: 07/17/2021 Discharge date: 07/19/2021  Admitted From: Home Disposition: Home  Recommendations for Outpatient Follow-up:  Follow up with PCP in 1-2 weeks   Home Health: No Equipment/Devices: None  Discharge Condition: Stable CODE STATUS: Full Diet recommendation: Cardiac  Brief/Interim Summary: 88 y.o. male with medical history significant of CAD/ obesity and renal cancer s/p nephrectomy coming with few weeks of exertional sob and intermittent chest tightness that has gotten worse.    Patient has a extensive coronary history with 3 stents in place, multiple PCI, multiple angioplasties.  Was previously evaluated for CABG at [facility], presumably determined not to be an appropriate candidate.  Cardiology consulted.  Case discussed with Dr. Garen Douglas.  Patient's anginal symptoms did improve after increasing Imdur dose to twice daily and adding Ranexa 500 mg twice daily.  Stable at time of discharge.  Did endorse to myself and cardiology that he has increasing fatigue and decreased exercise tolerance.  Continued exercise is strongly encouraged.  Patient appears to be deconditioned.  He expressed understanding of instructions.  Defer cardiac catheterization at this time.  Discharged home with adjusted medications.  Follow-up outpatient cardiology    Discharge Diagnoses:  Principal Problem:   Chest pain Active Problems:   Coronary artery disease involving native coronary artery of native heart with angina pectoris (HCC)   Unstable angina (HCC)   Hyperlipidemia   CAD S/P PTCA only of RPAV-PL   AKI (acute kidney injury) ([place])   Solitary kidney, acquired   Renal cell carcinoma (HCC)   Essential hypertension, benign   Abdominal aortic aneurysm (AAA) (HCC)   Elevated glucose   Suspected sleep apnea   Stable angina (HCC) Chest pain Shortness of  breath, DOE Coronary artery disease Patient with extensive coronary history.  Status post multiple PCI with angioplasty and 3 stents in place.  Will be difficult to get complete resolution of pain.  Case discussed with cardiology.  Appreciate their recommendations. Currently high-sensitivity troponin negative and chest pain-free Plan: Medical management.  Defer repeat catheterization.  On discharge will recommend Imdur 60 mg twice daily, Ranexa 500 mg twice daily, Lasix 20 mg 3 times weekly.  Patient will follow-up with outpatient cardiology as well as PCP.    Discharge Instructions  Discharge Instructions     Diet - low sodium heart healthy   Complete by: As directed    Increase activity slowly   Complete by: As directed       Allergies as of 07/19/2021       Reactions   Angiotensin Receptor Blockers    Other reaction(s): Kidney Disorder Hyperkalemia   Lovastatin Other (See Comments)   Elevated liver enzymes   Morphine Hives   Nifedipine Other (See Comments)   Elevated liver enzymes        Medication List     TAKE these medications    acetaminophen 500 MG tablet Commonly known as: TYLENOL Take 500 mg by mouth every 6 (six) hours as needed (for pain.).   amLODipine 5 MG tablet Commonly known as: NORVASC Take 1 tablet (5 mg total) by mouth every evening. What changed:  medication strength how much to take   aspirin 81 MG tablet Take 81 mg by mouth every evening.   atenolol 25 MG tablet Commonly known as: TENORMIN TAKE ONE-HALF (1/2) TABLET  BY MOUTH TWICE A DAY   clopidogrel 75 MG tablet Commonly known  as: PLAVIX TAKE 1 TABLET BY MOUTH  DAILY   ezetimibe 10 MG tablet Commonly known as: ZETIA TAKE 1 TABLET BY MOUTH  DAILY   finasteride 5 MG tablet Commonly known as: PROSCAR Take 1 tablet (5 mg total) by mouth daily. What changed: how much to take   furosemide 20 MG tablet Commonly known as: Lasix Take 1 tablet (20 mg total) by mouth 3 (three)  times a week. Start taking on: July 20, 2021   icosapent Ethyl 1 g capsule Commonly known as: VASCEPA TAKE 1 CAPSULE BY MOUTH  TWICE DAILY   isosorbide mononitrate 60 MG 24 hr tablet Commonly known as: IMDUR Take 1 tablet (60 mg total) by mouth 2 (two) times daily. What changed: when to take this   Livalo 4 MG Tabs Generic drug: Pitavastatin Calcium TAKE 1 TABLET BY MOUTH IN  THE EVENING   nitroGLYCERIN 0.4 MG SL tablet Commonly known as: NITROSTAT PLACE 1 TABLET (0.4 MG TOTAL) UNDER THE TONGUE EVERY 5 (FIVE) MINUTES AS NEEDED FOR CHEST PAIN.   ranolazine 500 MG 12 hr tablet Commonly known as: RANEXA Take 1 tablet (500 mg total) by mouth 2 (two) times daily.        Follow-up Information     Nicholas Rail Jossie Ng, NP. Go on 07/27/2021.   Specialty: Cardiology Why: 08:25 AM - Dr. Evette Douglas Nurse Practitioner Contact information: [address] [place] [address] [phone]                Allergies  Allergen Reactions   Angiotensin Receptor Blockers     Other reaction(s): Kidney Disorder Hyperkalemia   Lovastatin Other (See Comments)    Elevated liver enzymes   Morphine Hives   Nifedipine Other (See Comments)    Elevated liver enzymes    Consultations: Cardiology  Procedures/Studies: ECHOCARDIOGRAM COMPLETE  Result Date: 07/18/2021    ECHOCARDIOGRAM REPORT   [name] Date of Exam: 07/18/2021 Medical Rec #:  [id number]    Height:       67.5 in Accession #:    [id number]   Weight:       192.0 lb Date of Birth:  06-02-1931    BSA:          1.998 m Patient Age:    41 years     BP:           140/73 mmHg Patient Gender: M            HR:           63 bpm. Exam Location:  [facility] Procedure: 2D Echo, Cardiac Doppler, Color Doppler and Intracardiac            Opacification Agent Indications:     CHF-acute diastolic C58.52  History:         Patient has prior history of Echocardiogram examinations, most                  recent 12/30/2018. CAD  and Previous Myocardial Infarction; Risk                  Factors:Hypertension. Tobacco use disorder.  Sonographer:     Nicholas Douglas Referring Phys:  [id number] Nicholas Douglas Diagnosing Phys: Nicholas Sable MD  Sonographer Comments: Technically difficult study due to poor echo windows and no subcostal window. IMPRESSIONS  1. Left ventricular ejection fraction, by estimation, is 55 to 60%. The left ventricle has normal function. Left ventricular endocardial border  not optimally defined to evaluate regional wall motion. There is mild left ventricular hypertrophy. Left ventricular diastolic parameters are consistent with Grade I diastolic dysfunction (impaired relaxation).  2. Right ventricular systolic function was not well visualized. The right ventricular size is not well visualized.  3. The mitral valve is grossly normal. Mild mitral valve regurgitation.  4. The aortic valve is calcified. Aortic valve regurgitation is not visualized. Aortic valve sclerosis/calcification is present, without any evidence of aortic stenosis. FINDINGS  Left Ventricle: Left ventricular ejection fraction, by estimation, is 55 to 60%. The left ventricle has normal function. Left ventricular endocardial border not optimally defined to evaluate regional wall motion. Definity contrast agent was given IV to delineate the left ventricular endocardial borders. The left ventricular internal cavity size was normal in size. There is mild left ventricular hypertrophy. Left ventricular diastolic parameters are consistent with Grade I diastolic dysfunction (impaired relaxation). Right Ventricle: The right ventricular size is not well visualized. Right vetricular wall thickness was not well visualized. Right ventricular systolic function was not well visualized. Left Atrium: Left atrial size was normal in size. Right Atrium: Right atrial size was not well visualized. Pericardium: There is no evidence of pericardial effusion. Mitral Valve: The  mitral valve is grossly normal. Mild mitral annular calcification. Mild mitral valve regurgitation. MV peak gradient, 3.3 mmHg. The mean mitral valve gradient is 2.0 mmHg. Tricuspid Valve: The tricuspid valve is not well visualized. Tricuspid valve regurgitation is not demonstrated. Aortic Valve: The aortic valve is calcified. Aortic valve regurgitation is not visualized. Aortic valve sclerosis/calcification is present, without any evidence of aortic stenosis. Aortic valve mean gradient measures 1.0 mmHg. Aortic valve peak gradient measures 2.6 mmHg. Aortic valve area, by VTI measures 3.92 cm. Pulmonic Valve: The pulmonic valve was not well visualized. Pulmonic valve regurgitation is not visualized. Aorta: The aortic root is normal in size and structure. Venous: The inferior vena cava was not well visualized. IAS/Shunts: The interatrial septum was not assessed.  LEFT VENTRICLE PLAX 2D LVIDd:         3.60 cm   Diastology LVIDs:         2.60 cm   LV e' medial:    5.33 cm/s LV PW:         1.30 cm   LV E/e' medial:  13.3 LV IVS:        0.90 cm   LV e' lateral:   7.72 cm/s LVOT diam:     2.00 cm   LV E/e' lateral: 9.2 LV SV:         51 LV SV Index:   25 LVOT Area:     3.14 cm  RIGHT VENTRICLE RV Basal diam:  3.40 cm RV S prime:     11.40 cm/s TAPSE (M-mode): 1.6 cm LEFT ATRIUM             Index        RIGHT ATRIUM           Index LA diam:        4.00 cm 2.00 cm/m   RA Area:     18.50 cm LA Vol (A2C):   59.0 ml 29.52 ml/m  RA Volume:   49.20 ml  24.62 ml/m LA Vol (A4C):   48.8 ml 24.42 ml/m LA Biplane Vol: 54.5 ml 27.27 ml/m  AORTIC VALVE                    PULMONIC VALVE AV Area (Vmax):  2.68 cm     PV Vmax:        0.58 m/s AV Area (Vmean):   3.08 cm     PV Vmean:       42.250 cm/s AV Area (VTI):     3.92 cm     PV VTI:         0.124 m AV Vmax:           80.30 cm/s   PV Peak grad:   1.3 mmHg AV Vmean:          49.900 cm/s  PV Mean grad:   0.5 mmHg AV VTI:            0.129 m      RVOT Peak grad: 3 mmHg AV  Peak Grad:      2.6 mmHg AV Mean Grad:      1.0 mmHg LVOT Vmax:         68.50 cm/s LVOT Vmean:        48.900 cm/s LVOT VTI:          0.161 m LVOT/AV VTI ratio: 1.25  AORTA Ao Root diam: 3.20 cm MITRAL VALVE               TRICUSPID VALVE MV Area (PHT): 4.49 cm    TR Peak grad:   14.6 mmHg MV Area VTI:   2.12 cm    TR Vmax:        191.00 cm/s MV Peak grad:  3.3 mmHg MV Mean grad:  2.0 mmHg    SHUNTS MV Vmax:       0.90 m/s    Systemic VTI:  0.16 m MV Vmean:      62.7 cm/s   Systemic Diam: 2.00 cm MV Decel Time: 169 msec    Pulmonic VTI:  0.149 m MV E velocity: 70.70 cm/s MV A velocity: 91.70 cm/s MV E/A ratio:  0.77 Nicholas Sable MD Electronically signed by Nicholas Sable MD Signature Date/Time: 07/18/2021/6:58:08 PM    Final    US RENAL  Result Date: 07/18/2021 CLINICAL DATA:  Acute renal insufficiency. History of left nephrectomy. EXAM: RENAL / URINARY TRACT ULTRASOUND COMPLETE COMPARISON:  PET-CT 04/18/2020 FINDINGS: Right Kidney: Renal measurements: 13.5 x 6.9 x 6.5 cm = volume: 313.9 mL. Normal renal cortical thickness and echogenicity without focal lesions or hydronephrosis. Left Kidney: Surgically absent. Bladder: Appears normal for degree of bladder distention. Other: None. IMPRESSION: 1. Status post left nephrectomy. 2. Normal sonographic appearance of the right kidney. Electronically Signed   By: Marijo Sanes M.D.   On: 07/18/2021 09:12   DG Chest 2 View  Result Date: 07/17/2021 CLINICAL DATA:  Worsening shortness of breath for several weeks. Chest pain. EXAM: CHEST - 2 VIEW COMPARISON:  07/22/2016 FINDINGS: The heart size and mediastinal contours are within normal limits. Low lung volumes are again noted. Both lungs remain clear. IMPRESSION: Low lung volumes. No active cardiopulmonary disease. Electronically Signed   By: Marlaine Hind M.D.   On: 07/17/2021 15:42      Subjective: Seen and examined on day of discharge.  Stable no distress.  Stable for discharge home  Discharge  Exam: Vitals:   07/19/21 0802 07/19/21 1112  BP: (!) 148/80 133/82  Pulse: 79 80  Resp: 19 19  Temp: 98.1 F (36.7 C) 98.4 F (36.9 C)  SpO2: 96% 96%   Vitals:   07/18/21 2337 07/19/21 0356 07/19/21 0802 07/19/21 1112  BP: 136/80 137/74 (!) 148/80 133/82  Pulse: 70 73 79 80  Resp: '18 16 19 19  '$ Temp: (!) 97.4 F (36.3 C) 97.8 F (36.6 C) 98.1 F (36.7 C) 98.4 F (36.9 C)  TempSrc:    Oral  SpO2: 98% 98% 96% 96%  Weight:      Height:        General: Pt is alert, awake, not in acute distress Cardiovascular: RRR, S1/S2 +, no rubs, no gallops Respiratory: CTA bilaterally, no wheezing, no rhonchi Abdominal: Soft, NT, ND, bowel sounds + Extremities: no edema, no cyanosis    The results of significant diagnostics from this hospitalization (including imaging, microbiology, ancillary and laboratory) are listed below for reference.     Microbiology: No results found for this or any previous visit (from the past 240 hour(s)).   Labs: BNP (last 3 results) Recent Labs    07/17/21 1515  BNP 564.3*   Basic Metabolic Panel: Recent Labs  Lab 07/17/21 1515 07/18/21 0819  NA 140 137  K 4.5 4.1  CL 104 106  CO2 27 26  GLUCOSE 144* 103*  BUN 19 18  CREATININE 1.35* 1.02  CALCIUM 9.2 8.9   Liver Function Tests: No results for input(s): "AST", "ALT", "ALKPHOS", "BILITOT", "PROT", "ALBUMIN" in the last 168 hours. No results for input(s): "LIPASE", "AMYLASE" in the last 168 hours. No results for input(s): "AMMONIA" in the last 168 hours. CBC: Recent Labs  Lab 07/17/21 1515  WBC 6.7  HGB 16.0  HCT 49.3  MCV 88.4  PLT 215   Cardiac Enzymes: No results for input(s): "CKTOTAL", "CKMB", "CKMBINDEX", "TROPONINI" in the last 168 hours. BNP: Invalid input(s): "POCBNP" CBG: No results for input(s): "GLUCAP" in the last 168 hours. D-Dimer Recent Labs    07/17/21 1515  DDIMER 0.41   Hgb A1c Recent Labs    07/17/21 1523  HGBA1C 5.7*   Lipid Profile No results  for input(s): "CHOL", "HDL", "LDLCALC", "TRIG", "CHOLHDL", "LDLDIRECT" in the last 72 hours. Thyroid function studies No results for input(s): "TSH", "T4TOTAL", "T3FREE", "THYROIDAB" in the last 72 hours.  Invalid input(s): "FREET3" Anemia work up No results for input(s): "VITAMINB12", "FOLATE", "FERRITIN", "TIBC", "IRON", "RETICCTPCT" in the last 72 hours. Urinalysis    Component Value Date/Time   COLORURINE YELLOW (A) 07/18/2021 0800   APPEARANCEUR CLEAR (A) 07/18/2021 0800   APPEARANCEUR Clear 04/03/2013 0054   LABSPEC 1.023 07/18/2021 0800   LABSPEC 1.014 04/03/2013 0054   PHURINE 5.0 07/18/2021 0800   GLUCOSEU NEGATIVE 07/18/2021 0800   GLUCOSEU Negative 04/03/2013 0054   HGBUR NEGATIVE 07/18/2021 0800   BILIRUBINUR NEGATIVE 07/18/2021 0800   BILIRUBINUR neg 12/22/2013 0946   BILIRUBINUR Negative 04/03/2013 0054   KETONESUR NEGATIVE 07/18/2021 0800   PROTEINUR NEGATIVE 07/18/2021 0800   UROBILINOGEN 0.2 12/22/2013 0946   UROBILINOGEN 0.2 01/09/2010 0123   NITRITE NEGATIVE 07/18/2021 0800   LEUKOCYTESUR NEGATIVE 07/18/2021 0800   LEUKOCYTESUR Negative 04/03/2013 0054   Sepsis Labs Recent Labs  Lab 07/17/21 1515  WBC 6.7   Microbiology No results found for this or any previous visit (from the past 240 hour(s)).   Time coordinating discharge: Over 30 minutes  SIGNED:   Sidney Ace, MD  [facility] 07/19/2021, 3:08 PM Pager   If 7PM-7AM, please contact night-coverage

## 2021-07-19 NOTE — Progress Notes (Signed)
Cardiology Progress Note   Patient Name: Nicholas Douglas Date of Encounter: 07/19/2021  Primary Cardiologist: [name], MD  Subjective   Ambulated this AM and noted improved activity tolerance.  No chest pain or sob.  Inpatient Medications    Scheduled Meds:  amLODipine  5 mg Oral QPM   aspirin EC  81 mg Oral QPM   atenolol  12.5 mg Oral Daily   clopidogrel  75 mg Oral Daily   ezetimibe  10 mg Oral Daily   finasteride  5 mg Oral Daily   heparin  5,000 Units Subcutaneous Q8H   isosorbide mononitrate  60 mg Oral BID   pravastatin  80 mg Oral q1800   ranolazine  500 mg Oral BID   Continuous Infusions:  PRN Meds: acetaminophen, hydrALAZINE, nitroGLYCERIN, ondansetron (ZOFRAN) IV, traZODone   Vital Signs    Vitals:   07/18/21 2337 07/19/21 0356 07/19/21 0802 07/19/21 1112  BP: 136/80 137/74 (!) 148/80 133/82  Pulse: 70 73 79 80  Resp: '18 16 19 19  '$ Temp: (!) 97.4 F (36.3 C) 97.8 F (36.6 C) 98.1 F (36.7 C) 98.4 F (36.9 C)  TempSrc:    Oral  SpO2: 98% 98% 96% 96%  Weight:      Height:        Intake/Output Summary (Last 24 hours) at 07/19/2021 1226 Last data filed at 07/19/2021 1112 Gross per 24 hour  Intake 720 ml  Output 1930 ml  Net -1210 ml   Filed Weights   07/17/21 1511  Weight: 87.1 kg    Physical Exam   GEN: Well nourished, well developed, in no acute distress.  HEENT: Grossly normal.  Neck: Supple, no JVD, carotid bruits, or masses. Cardiac: RRR, no murmurs, rubs, or gallops. No clubbing, cyanosis, edema.  Radials 2+, DP/PT 2+ and equal bilaterally.  Respiratory:  Respirations regular and unlabored, bibasilar crackles. GI: Soft, nontender, nondistended, BS + x 4. MS: no deformity or atrophy. Skin: warm and dry, no rash. Neuro:  Strength and sensation are intact. Psych: AAOx3.  Normal affect.  Labs    Chemistry Recent Labs  Lab 07/17/21 1515 07/18/21 0819  NA 140 137  K 4.5 4.1  CL 104 106  CO2 27 26  GLUCOSE 144* 103*  BUN 19  18  CREATININE 1.35* 1.02  CALCIUM 9.2 8.9  GFRNONAA 50* >60  ANIONGAP 9 5     Hematology Recent Labs  Lab 07/17/21 1515  WBC 6.7  RBC 5.58  HGB 16.0  HCT 49.3  MCV 88.4  MCH 28.7  MCHC 32.5  RDW 12.8  PLT 215    Cardiac Enzymes  Recent Labs  Lab 07/17/21 1515 07/17/21 1743  TROPONINIHS 8 7      BNP    Component Value Date/Time   BNP 138.0 (H) 07/17/2021 1515    ProBNP    Component Value Date/Time   PROBNP 43.0 01/08/2010 1642   DDimer  Recent Labs  Lab 07/17/21 1515  DDIMER 0.41    Lipids  Lab Results  Component Value Date   CHOL 147 12/20/2020   HDL 47 12/20/2020   LDLCALC 79 12/20/2020   TRIG 118 12/20/2020   CHOLHDL 3.1 12/20/2020    HbA1c  Lab Results  Component Value Date   HGBA1C 5.7 (H) 07/17/2021   Lab Results  Component Value Date   TSH 6.280 (H) 12/20/2020     Radiology    US RENAL  Result Date: 07/18/2021 CLINICAL DATA:  Acute renal  insufficiency. History of left nephrectomy. EXAM: RENAL / URINARY TRACT ULTRASOUND COMPLETE COMPARISON:  PET-CT 04/18/2020 FINDINGS: Right Kidney: Renal measurements: 13.5 x 6.9 x 6.5 cm = volume: 313.9 mL. Normal renal cortical thickness and echogenicity without focal lesions or hydronephrosis. Left Kidney: Surgically absent. Bladder: Appears normal for degree of bladder distention. Other: None. IMPRESSION: 1. Status post left nephrectomy. 2. Normal sonographic appearance of the right kidney. Electronically Signed   By: Marijo Sanes M.D.   On: 07/18/2021 09:12   DG Chest 2 View  Result Date: 07/17/2021 CLINICAL DATA:  Worsening shortness of breath for several weeks. Chest pain. EXAM: CHEST - 2 VIEW COMPARISON:  07/22/2016 FINDINGS: The heart size and mediastinal contours are within normal limits. Low lung volumes are again noted. Both lungs remain clear. IMPRESSION: Low lung volumes. No active cardiopulmonary disease. Electronically Signed   By: Marlaine Hind M.D.   On: 07/17/2021 15:42    Telemetry     RSR, PVCs, couplets, 60's - Personally Reviewed  Cardiac Studies   Cardiac Catheterization  11.2020  Diagnostic Dominance: Right  _____________   2D Echocardiogram 6.14.2023   1. Left ventricular ejection fraction, by estimation, is 55 to 60%. The  left ventricle has normal function. Left ventricular endocardial border  not optimally defined to evaluate regional wall motion. There is mild left  ventricular hypertrophy. Left  ventricular diastolic parameters are consistent with Grade I diastolic  dysfunction (impaired relaxation).   2. Right ventricular systolic function was not well visualized. The right  ventricular size is not well visualized.   3. The mitral valve is grossly normal. Mild mitral valve regurgitation.   4. The aortic valve is calcified. Aortic valve regurgitation is not  visualized. Aortic valve sclerosis/calcification is present, without any  evidence of aortic stenosis.  _____________  Patient Profile     88 y.o. male w/ a /o CAD s/p multiple PCI's, HTN, HL, infrarenal AAA, renal cell carcinoma s/p nephrectomy, and BPH, who was admitted 6/13 w/ progressive dyspnea and chest pain.  Assessment & Plan    1.  Unstable Angina/CAD:  Extensive h/o CAD dating back to the late 1980's w/ most recent cath in 2020 revealing patent stents/stable anatomy.  Presented 6/13 w/ progressive dyspnea and chest pain similar to prior angina.  Despite this, HsTrops nl.  Echo this admission w/ nl EF.  Nitrate tirated on 6/14 and ranexa added.  He was able to walk up and down hallway this AM  w/o recurrent symptoms.  Long discussion w/ pt and wife re: mgmt plans.  In light of absence of objective evidence of ishcemia, we collectively agreed to cont w/ conservative approach and medical therapy including asa, plavix, ? blocker, ccb, nitrate, ranexa, and statin rx.  2.  HFpEF:  Noted to have crackles on exam and given a dose of lasix on 6/14.  Echo w/ nl EF and grI diast dysfxn.  Minus  885 overnight.  Euvolemic on exam.  HR/BP stable.  Cont current meds.  Potential benefit to prn lasix @ home given good response.  3.  Essential HTN:  BP relatively stable.  Cont current meds.  4.  HL:  Cont statin rx.  Signed, Murray Hodgkins, NP  07/19/2021, 12:26 PM    For questions or updates, please contact   Please consult [email or web address] for contact info under Cardiology/STEMI.

## 2021-07-19 NOTE — TOC CM/SW Note (Signed)
Patient has orders to discharge home today. PCP is Emily Filbert, MD. On room air. No wounds. No TOC needs identified. CSW signing off.  Nicholas Douglas, [place]

## 2021-07-24 NOTE — Progress Notes (Unsigned)
Cardiology Clinic Note   Patient Name: Nicholas Douglas Date of Encounter: 07/24/2021  Primary Care Provider:  Rusty Aus, MD Primary Cardiologist:  [name], MD  Patient Profile    Nicholas Douglas 88 year old male presents to the clinic today for follow-up evaluation of his coronary artery disease and essential hypertension.  Past Medical History    Past Medical History:  Diagnosis Date   BPH (benign prostatic hyperplasia)    Followed by [name]/urology   CAD S/P PTCA only of RPAV-PL 05/31/2015   99% --> 0%PAV - PTCA only (unable to advance STENT) due to RCA calcification - prox & distal RCA 30%; Patent LAD stent & Cx stent (~20% ISR).    Calculus of kidney    "that's how they found the cancer" (04/27/2013)   Coronary artery disease involving native coronary artery of native heart with unstable angina pectoris ([place]) 12/03/2011   S/P Cardiac angioplasty 1986, 1991, 1993, 1997.  Last cardiac catheterization 2001.  Cardiolite 05/2011 low risk with normal EF 65%.  Followed by cardiology/[name] every six months.    Dyspnea    only on exertion   History of myocardial infarction 1976   Hypertension    Impotence of organic origin    Internal hemorrhoids without mention of complication    Malignant neoplasm of kidney, except pelvis    Melanoma ([place]) 06/2010   Left arm   Melanoma of back ([place]) 02/07/2015   R upper back; excision [facility].   Myocardial infarction [facility])    Other abnormal blood chemistry    Other nonspecific abnormal serum enzyme levels    Personal history of colonic polyps    Pure hypercholesterolemia    Tobacco use disorder    Unspecified congenital cystic kidney disease    Unstable angina ([place]) 04/05/2013; 05/2015   Past Surgical History:  Procedure Laterality Date   APPENDECTOMY  2014   CARDIAC CATHETERIZATION N/A 05/30/2015   Procedure: Left Heart Cath and Coronary Angiography;  Surgeon: [name], MD;  Location: [facility];  Service:  Cardiovascular;  Laterality: N/A;   CARDIAC CATHETERIZATION N/A 05/30/2015   Procedure: Coronary Balloon Angioplasty;  Surgeon: [name], MD;  Location: [facility];  Service: Cardiovascular;  Laterality: N/A;   Cardiolite  05/06/2011   low risk study; normal EF.  [facility]   carotid dopplers  03/08/2011   minimal plaque formation B. Symptoms: dizziness.   COLONOSCOPY  10/05/2008   single polyp, IH.  [name].  Repeat in 3 years.   CORONARY ANGIOPLASTY     "I've had 4" (04/27/2013)   CORONARY ANGIOPLASTY WITH STENT PLACEMENT  04/2013; 04/27/2013   "2 + 1" (04/27/2013)   EYE SURGERY  11/04/2013   Cataract surgery B; [place].   LEFT HEART CATH AND CORONARY ANGIOGRAPHY N/A 08/05/2016   Procedure: Left Heart Cath and Coronary Angiography;  Surgeon: Martinique, Peter M, MD;  Location: [facility];  Service: Cardiovascular;  Laterality: N/A;   LEFT HEART CATH AND CORONARY ANGIOGRAPHY N/A 01/04/2019   Procedure: LEFT HEART CATH AND CORONARY ANGIOGRAPHY;  Surgeon: [name], MD;  Location: [facility];  Service: Cardiovascular;  Laterality: N/A;   MELANOMA EXCISION Left ~ 2007   "arm"   MELANOMA EXCISION  02/07/2015   R upper back. [facility]   MICROLARYNGOSCOPY N/A 04/06/2020   Procedure: MICROLARYNGOSCOPY WITH BIOPSY OF LARYNX;  Surgeon: Clyde Canterbury, MD;  Location: [facility];  Service: ENT;  Laterality:  N/A;   NEPHRECTOMY Left 2006   PERCUTANEOUS CORONARY ROTOBLATOR INTERVENTION (PCI-R) N/A 04/06/2013   Procedure: PERCUTANEOUS CORONARY ROTOBLATOR INTERVENTION (PCI-R);  Surgeon: [name], MD;  Location: [facility] CATH LAB;  Service: Cardiovascular;  Laterality: N/A;   PERCUTANEOUS CORONARY STENT INTERVENTION (PCI-S) N/A 04/27/2013   Procedure: PERCUTANEOUS CORONARY STENT INTERVENTION (PCI-S);  Surgeon: [name], MD;  Location: [facility] CATH LAB;  Service: Cardiovascular;  Laterality: N/A;    Allergies  Allergies  Allergen Reactions   Angiotensin Receptor Blockers     Other reaction(s):  Kidney Disorder Hyperkalemia   Lovastatin Other (See Comments)    Elevated liver enzymes   Morphine Hives   Nifedipine Other (See Comments)    Elevated liver enzymes    History of Present Illness    Nicholas Douglas has a PMH of essential hypertension, coronary artery disease, stable angina, bronchitis, squamous cell carcinoma of the larynx, renal cell carcinoma, hyperlipidemia, elevated glucose and suspected sleep apnea.  He has had several cardiac catheterizations in 1987, 1991 1993 and at 24.  His catheterization in 2011 showed preserved LVEF with mild residual distal inferior apical hypocontractility.  He was noted to have 30-40% proximal LAD, 50% diffusely, and 70-80% distally.  Circumflex stenosis of 70%, 40% OM 2, 30-40% and 50% RCA stenosis and 80% - 90% stenosis in the acute marginal branch medical management was recommended.  He was noted to have increased shortness of breath 6/18.  He denied chest tightness.  During his previous cardiac catheterization he was noted to have a subtotal distal RCA.  He was unable to receive stenting underwent RCA PTCA.  He underwent repeat cardiac catheterization 7/18 by Dr. Martinique.  He was noted to have patency of his stents in the proximal-mid LAD and patency of his mid circumflex stent as well as patent PTCA of the PL OM vessel.  There was no new disease to explain his symptoms.  He had normal LV function and normal LVEDP.  Postcatheterization he felt well but continued to experience shortness of breath with weakness and fatigue.  He denied chest tightness.  Lab work was done and he was noted to have an elevated potassium of 5.7.  On recheck 3 days later it was 5.4.  He was taken off ARB therapy.  His echocardiogram 11/20 showed an EF of 55-60%, mild aortic sclerosis, intermediate diastolic dysfunction.  He underwent successful cardiac catheterization 11/20 which showed widely patent stents.  Medical management was recommended.  He was seen in follow-up  by Dr. [name] on 01/04/2021.  During that time he continued to do well.  He continued to have a raspy voice which has not improved somewhat.  He denied chest pain or shortness of breath.  He did note some fatigue.  His amlodipine 10, atenolol 12.5 twice daily, Imdur 60 mg were continued.  He was continued on [place], [place].  He is not a candidate for CABG  He presented to the emergency department on 07/17/2021 with stable angina.  He reported that he had noted intermittent chest tightness that had gotten worse.  His Imdur was increased and he was also given Ranexa 500 mg twice daily.  He was encouraged to increase his physical activity.  He was felt to be deconditioned.  (Discharged with Imdur 60 mg twice daily, Ranexa 500 mg twice daily, furosemide 20 mg 3 times per week.  He presents to the clinic today for follow-up evaluation states***  *** denies chest pain, shortness of breath, lower extremity edema,  fatigue, palpitations, melena, hematuria, hemoptysis, diaphoresis, weakness, presyncope, syncope, orthopnea, and PND.   Home Medications    Prior to Admission medications   Medication Sig Start Date End Date Taking? Authorizing Provider  acetaminophen (TYLENOL) 500 MG tablet Take 500 mg by mouth every 6 (six) hours as needed (for pain.).     [provider]  amLODipine (NORVASC) 5 MG tablet Take 1 tablet (5 mg total) by mouth every evening. 07/19/21 08/18/21  Sidney Ace, MD  aspirin 81 MG tablet Take 81 mg by mouth every evening.    [provider]  atenolol (TENORMIN) 25 MG tablet TAKE ONE-HALF (1/2) TABLET  BY MOUTH TWICE A DAY 12/06/20   [name], MD  clopidogrel (PLAVIX) 75 MG tablet TAKE 1 TABLET BY MOUTH  DAILY 09/12/20   [name], MD  ezetimibe (ZETIA) 10 MG tablet TAKE 1 TABLET BY MOUTH  DAILY 12/06/20   [name], MD  finasteride (PROSCAR) 5 MG tablet Take 1 tablet (5 mg total) by mouth daily. Patient taking differently: Take 2.5 mg by  mouth daily. 12/22/13   Wardell Honour, MD  furosemide (LASIX) 20 MG tablet Take 1 tablet (20 mg total) by mouth 3 (three) times a week. 07/20/21 09/18/21  Sidney Ace, MD  icosapent Ethyl (VASCEPA) 1 g capsule TAKE 1 CAPSULE BY MOUTH  TWICE DAILY 01/22/21   [name], MD  isosorbide mononitrate (IMDUR) 60 MG 24 hr tablet Take 1 tablet (60 mg total) by mouth 2 (two) times daily. 07/19/21 08/18/21  Sidney Ace, MD  LIVALO 4 MG TABS TAKE 1 TABLET BY MOUTH IN  THE EVENING 06/11/21   [name], MD  nitroGLYCERIN (NITROSTAT) 0.4 MG SL tablet PLACE 1 TABLET (0.4 MG TOTAL) UNDER THE TONGUE EVERY 5 (FIVE) MINUTES AS NEEDED FOR CHEST PAIN. 11/26/16   Lyda Jester M, PA-C  ranolazine (RANEXA) 500 MG 12 hr tablet Take 1 tablet (500 mg total) by mouth 2 (two) times daily. 07/19/21 08/18/21  Sidney Ace, MD    Family History    Family History  Problem Relation Age of Onset   Heart disease Mother        CAD, AAA.   AAA (abdominal aortic aneurysm) Mother    He indicated that his mother is deceased. He indicated that his father is deceased.  Social History    Social History   Socioeconomic History   Marital status: Married    Spouse name: Not on file   Number of children: 3   Years of education: college   Highest education level: Not on file  Occupational History   Occupation: Ecologist: RETIRED    Comment: Personal assistant, [employer]  Tobacco Use   Smoking status: Former    Packs/day: 0.25    Years: 20.00    Total pack years: 5.00    Types: Cigars, Pipe, Cigarettes   Smokeless tobacco: Former    Types: Chew   Tobacco comments:    Smokes about 2 cigars two months   Vaping Use   Vaping Use: Never used  Substance and Sexual Activity   Alcohol use: Yes    Alcohol/week: 5.0 standard drinks of alcohol    Types: 5 Glasses of wine per week   Drug use: No   Sexual activity: Yes  Other Topics Concern   Not on file  Social History  Narrative   Marital status:  Married x 30 years;  2nd marriage      Children:  3 children, 2 step-children and 10 grandchildren.        Lives:  Lives with wife.         Employment:  Retired; Environmental education officer      Tobacco:  Former user; chews tobacco.      Alcohol:  5 glasses of wine per week       Drugs:  No drugs.        Exercise: 5 x week Light; plays golf 5 days per week but rides golf cart, walking 1 mile.         Advanced Directives:  Patient DOES NOT have living will; desires DNR/DNI.       ADLS: independent with all ADLs; drives.  No falls.  Does not use assistant devices with ambulation.     Social Determinants of Health   Financial Resource Strain: Not on file  Food Insecurity: Not on file  Transportation Needs: Not on file  Physical Activity: Not on file  Stress: Not on file  Social Connections: Not on file  Intimate Partner Violence: Not on file     Review of Systems    General:  No chills, fever, night sweats or weight changes.  Cardiovascular:  No chest pain, dyspnea on exertion, edema, orthopnea, palpitations, paroxysmal nocturnal dyspnea. Dermatological: No rash, lesions/masses Respiratory: No cough, dyspnea Urologic: No hematuria, dysuria Abdominal:   No nausea, vomiting, diarrhea, bright red blood per rectum, melena, or hematemesis Neurologic:  No visual changes, wkns, changes in mental status. All other systems reviewed and are otherwise negative except as noted above.  Physical Exam    VS:  There were no vitals taken for this visit. , BMI There is no height or weight on file to calculate BMI. GEN: Well nourished, well developed, in no acute distress. HEENT: normal. Neck: Supple, no JVD, carotid bruits, or masses. Cardiac: RRR, no murmurs, rubs, or gallops. No clubbing, cyanosis, edema.  Radials/DP/PT 2+ and equal bilaterally.  Respiratory:  Respirations regular and unlabored, clear to auscultation bilaterally. GI: Soft, nontender, nondistended, BS + x 4. MS: no  deformity or atrophy. Skin: warm and dry, no rash. Neuro:  Strength and sensation are intact. Psych: Normal affect.  Accessory Clinical Findings    Recent Labs: 12/20/2020: ALT 34; TSH 6.280 07/17/2021: B Natriuretic Peptide 138.0; Hemoglobin 16.0; Platelets 215 07/18/2021: BUN 18; Creatinine, Ser 1.02; Potassium 4.1; Sodium 137   Recent Lipid Panel    Component Value Date/Time   CHOL 147 12/20/2020 0706   TRIG 118 12/20/2020 0706   TRIG 131 02/22/2013 0804   HDL 47 12/20/2020 0706   HDL 40 02/22/2013 0804   CHOLHDL 3.1 12/20/2020 0706   CHOLHDL 3.5 05/09/2015 1400   VLDL 31 (H) 05/09/2015 1400   LDLCALC 79 12/20/2020 0706   LDLCALC 50 02/22/2013 0804    ECG personally reviewed by me today- *** - No acute changes  Echocardiogram 07/18/2021 IMPRESSIONS     1. Left ventricular ejection fraction, by estimation, is 55 to 60%. The  left ventricle has normal function. Left ventricular endocardial border  not optimally defined to evaluate regional wall motion. There is mild left  ventricular hypertrophy. Left  ventricular diastolic parameters are consistent with Grade I diastolic  dysfunction (impaired relaxation).   2. Right ventricular systolic function was not well visualized. The right  ventricular size is not well visualized.   3. The mitral valve is grossly normal. Mild mitral valve regurgitation.   4. The  aortic valve is calcified. Aortic valve regurgitation is not  visualized. Aortic valve sclerosis/calcification is present, without any  evidence of aortic stenosis.  Cardiac catheterization 01/04/2019 Mid LM to Mid LAD lesion is 10% stenosed. 2nd Diag lesion is 40% stenosed. Mid LAD to Dist LAD lesion is 20% stenosed. Prox Cx to Mid Cx lesion is 5% stenosed. Ost Cx to Prox Cx lesion is 20% stenosed. 1st Mrg lesion is 40% stenosed. 2nd Mrg lesion is 30% stenosed. Ost RCA to Mid RCA lesion is 20% stenosed. Acute Mrg lesion is 70% stenosed. Dist RCA lesion is 30%  stenosed. RPAV lesion is 20% stenosed. The left ventricular systolic function is normal. LV end diastolic pressure is normal. RPDA lesion is 40% stenosed.   Widely patent stents in the LAD and circumflex vessel.  The LAD has 40% stenosis in a small diagonal branch arising just distal to the stent in the mid LAD.  There is mild 20% mid LAD narrowing; complex vessel has smooth 20% narrowing proximal to the stented segment.  Small marginal branches arising within the circumflex stent had ostial narrowing of 40 and 30%; the  right coronary artery has moderate diffuse calcification with irregularity with narrowing of 20 to 30% in the mid vessel, 30% prior to PDA, and with no significant restenosis at the site of distal PTCA.  A small acute marginal branch has 70 to 80% ostial narrowing in a small caliber vessel.   LVEDP 17 - 23 MM hG.   RECOMMENDATION: The catheterization study is not significantly changed from the patient's last catheterization in July 2018.  Atherectomy sites with stenting in the LAD and circumflex vessel remain widely patent.  There is mild small branch ostial narrowings.  The RCA is calcified with patent PTCA site.  Medical therapy will be continued.  Continue long-term DAPT as the patient has been on aspirin/Plavix.  Continue aggressive lipid-lowering therapy with target LDL less than 70.  Exercise prescription.  Diagnostic Dominance: Right  Intervention   Assessment & Plan   1.  Stable angina-presented to the emergency department on 07/17/2021.  He reported increasing shortness of breath and worsening intermittent periods of chest discomfort.  His Imdur was increased to 60 mg twice daily and Ranexa was added 500 mg twice daily.  Several previous cardiac catheterizations, details above.  Medical management recommended.  Not a candidate for CABG. Continue aspirin, atenolol, ezetimibe, Imdur, Ranexa, Livalo, Vascepa, Heart healthy low-sodium diet-salty 6 given Increase physical  activity as tolerated  Chronic diastolic CHF, shortness of breath, DOE-felt to be related to deconditioning/sedentary lifestyle.  Did note improvement with IV Lasix.  Reported dietary indiscretion and having a biscuit at fast food restaurant for breakfast daily. Continue furosemide, atenolol Heart healthy low-sodium diet-salty 6 given Increase physical activity as tolerated We will sign  Hyperlipidemia-LDL***. Continue Livalo, ezetimibe, Vascepa Heart healthy low-sodium high-fiber diet Increase physical activity as tolerated  Essential hypertension-BP today*** Continue atenolol, amlodipine, Imdur Heart healthy low-sodium diet-salty 6 given Increase physical activity as tolerated  Disposition: Follow-up with Dr. [name] in 3-4 months.  Jossie Ng. Britany Callicott NP-C    07/24/2021, 9:17 AM [place] [address] Office [phone] Fax [phone]  Notice: This dictation was prepared with Dragon dictation along with smaller phrase technology. Any transcriptional errors that result from this process are unintentional and may not be corrected upon review.  I spent***minutes examining this patient, reviewing medications, and using patient centered shared decision making involving her cardiac care.  Prior  to her visit I spent greater than 20 minutes reviewing her past medical history,  medications, and prior cardiac tests.

## 2021-07-27 ENCOUNTER — Encounter: Payer: Self-pay | Admitting: General Practice

## 2021-07-27 ENCOUNTER — Ambulatory Visit (INDEPENDENT_AMBULATORY_CARE_PROVIDER_SITE_OTHER): Payer: Medicare Other | Admitting: General Practice

## 2021-07-27 VITALS — BP 138/60 | HR 62 | Ht 67.5 in | Wt 191.0 lb

## 2021-07-27 DIAGNOSIS — E785 Hyperlipidemia, unspecified: Secondary | ICD-10-CM

## 2021-07-27 DIAGNOSIS — I2510 Atherosclerotic heart disease of native coronary artery without angina pectoris: Secondary | ICD-10-CM

## 2021-07-27 DIAGNOSIS — I5032 Chronic diastolic (congestive) heart failure: Secondary | ICD-10-CM

## 2021-07-27 DIAGNOSIS — I10 Essential (primary) hypertension: Secondary | ICD-10-CM

## 2021-07-27 DIAGNOSIS — I25810 Atherosclerosis of coronary artery bypass graft(s) without angina pectoris: Secondary | ICD-10-CM

## 2021-07-27 MED ORDER — ISOSORBIDE MONONITRATE ER 60 MG PO TB24: ORAL_TABLET | ORAL | 0 refills | Status: DC

## 2021-07-27 MED ORDER — NITROGLYCERIN 0.4 MG SL SUBL: 0.4000 mg | SUBLINGUAL_TABLET | SUBLINGUAL | 11 refills | Status: DC | PRN

## 2021-07-28 LAB — CBC
Hematocrit: 52 % — ABNORMAL HIGH (ref 37.5–51.0)
Hemoglobin: 17.1 g/dL (ref 13.0–17.7)
MCH: 29.1 pg (ref 26.6–33.0)
MCHC: 32.9 g/dL (ref 31.5–35.7)
MCV: 89 fL (ref 79–97)
Platelets: 230 10*3/uL (ref 150–450)
RBC: 5.87 x10E6/uL — ABNORMAL HIGH (ref 4.14–5.80)
RDW: 13 % (ref 11.6–15.4)
WBC: 6.9 10*3/uL (ref 3.4–10.8)

## 2021-07-28 LAB — COMPREHENSIVE METABOLIC PANEL
ALT: 36 IU/L (ref 0–44)
AST: 38 IU/L (ref 0–40)
Albumin/Globulin Ratio: 1.5 (ref 1.2–2.2)
Albumin: 4.6 g/dL (ref 3.6–4.6)
Alkaline Phosphatase: 62 IU/L (ref 44–121)
BUN/Creatinine Ratio: 13 (ref 10–24)
BUN: 18 mg/dL (ref 8–27)
Bilirubin Total: 0.7 mg/dL (ref 0.0–1.2)
CO2: 27 mmol/L (ref 20–29)
Calcium: 9.9 mg/dL (ref 8.6–10.2)
Chloride: 105 mmol/L (ref 96–106)
Creatinine, Ser: 1.34 mg/dL — ABNORMAL HIGH (ref 0.76–1.27)
Globulin, Total: 3 g/dL (ref 1.5–4.5)
Glucose: 109 mg/dL — ABNORMAL HIGH (ref 70–99)
Potassium: 5.4 mmol/L — ABNORMAL HIGH (ref 3.5–5.2)
Sodium: 146 mmol/L — ABNORMAL HIGH (ref 134–144)
Total Protein: 7.6 g/dL (ref 6.0–8.5)
eGFR: 51 mL/min/{1.73_m2} — ABNORMAL LOW (ref >59)

## 2021-08-01 ENCOUNTER — Telehealth: Payer: Self-pay | Admitting: Cardiovascular Disease

## 2021-08-01 NOTE — Telephone Encounter (Signed)
Pt returning nurses call regarding lab results. Please advise 

## 2021-08-02 ENCOUNTER — Encounter: Payer: Self-pay | Admitting: Otolaryngology

## 2021-08-02 ENCOUNTER — Other Ambulatory Visit: Payer: Self-pay

## 2021-08-02 DIAGNOSIS — E875 Hyperkalemia: Secondary | ICD-10-CM

## 2021-08-09 ENCOUNTER — Other Ambulatory Visit: Payer: Self-pay

## 2021-08-09 ENCOUNTER — Telehealth: Payer: Self-pay | Admitting: Cardiovascular Disease

## 2021-08-09 DIAGNOSIS — E875 Hyperkalemia: Secondary | ICD-10-CM

## 2021-08-09 NOTE — Telephone Encounter (Signed)
New Message:     Patient's wife says patient needs a lab order for [facility] on [address] in [place]. Wife said to please let her know when you send it.

## 2021-08-09 NOTE — Telephone Encounter (Signed)
Patient stated he went for lab work, but it wasn't ordered. I check in Epic. The order was there but not released. I explained this to the patient and that he could go for blood work at [facility].

## 2021-08-10 ENCOUNTER — Other Ambulatory Visit: Payer: Self-pay | Admitting: Cardiovascular Disease

## 2021-08-10 LAB — BASIC METABOLIC PANEL
BUN/Creatinine Ratio: 13 (ref 10–24)
BUN: 17 mg/dL (ref 8–27)
CO2: 23 mmol/L (ref 20–29)
Calcium: 9.6 mg/dL (ref 8.6–10.2)
Chloride: 104 mmol/L (ref 96–106)
Creatinine, Ser: 1.29 mg/dL — ABNORMAL HIGH (ref 0.76–1.27)
Glucose: 99 mg/dL (ref 70–99)
Potassium: 4.9 mmol/L (ref 3.5–5.2)
Sodium: 141 mmol/L (ref 134–144)
eGFR: 53 mL/min/{1.73_m2} — ABNORMAL LOW (ref >59)

## 2021-08-31 ENCOUNTER — Ambulatory Visit (INDEPENDENT_AMBULATORY_CARE_PROVIDER_SITE_OTHER): Payer: Medicare Other | Admitting: Cardiovascular Disease

## 2021-08-31 ENCOUNTER — Encounter: Payer: Self-pay | Admitting: Cardiovascular Disease

## 2021-08-31 DIAGNOSIS — I5189 Other ill-defined heart diseases: Secondary | ICD-10-CM | POA: Diagnosis not present

## 2021-08-31 DIAGNOSIS — I2510 Atherosclerotic heart disease of native coronary artery without angina pectoris: Secondary | ICD-10-CM

## 2021-08-31 DIAGNOSIS — E785 Hyperlipidemia, unspecified: Secondary | ICD-10-CM | POA: Diagnosis not present

## 2021-08-31 DIAGNOSIS — I10 Essential (primary) hypertension: Secondary | ICD-10-CM

## 2021-08-31 DIAGNOSIS — I5032 Chronic diastolic (congestive) heart failure: Secondary | ICD-10-CM | POA: Diagnosis not present

## 2021-08-31 DIAGNOSIS — C320 Malignant neoplasm of glottis: Secondary | ICD-10-CM

## 2021-08-31 MED ORDER — RANOLAZINE ER 500 MG PO TB12: 500.0000 mg | ORAL_TABLET | Freq: Two times a day (BID) | ORAL | 3 refills | Status: DC

## 2021-08-31 MED ORDER — FUROSEMIDE 20 MG PO TABS: 20.0000 mg | ORAL_TABLET | ORAL | 2 refills | Status: DC

## 2021-08-31 MED ORDER — ISOSORBIDE MONONITRATE ER 60 MG PO TB24: 60.0000 mg | ORAL_TABLET | Freq: Two times a day (BID) | ORAL | 3 refills | Status: DC

## 2021-08-31 NOTE — Progress Notes (Unsigned)
Patient ID: Nicholas Douglas, male   DOB: 04-17-1931, 88 y.o.   MRN: [id number]     HPI: Nicholas Douglas is an 88 y.o. Caucasian male who presents to the office today for an 8 month cardiology evaluation.  Nicholas Douglas has known CAD and underwent numerous interventions dating back to 88, 1991, 1993, and in 1997. Cardiac catheterization in 2011 which showed preserved LV function with mild residual distal inferior apical hypocontractility. There is evidence for coronary calcification with segmental narrowing of his LAD of 30-40% proximally, 50% diffusely, in the midsegment, and 70-80% in the distal region, he had AV groove circumflex stenoses of 70 and 50% with a 40% OM 2 stenosis, and a 30-40 and 50% RCA stenoses with 80-90% stenosis in the acute marginal branch. He had been on medical therapy.  He presented to [facility] in March 2015 with class IV angina and catheterization demonstrated severe 2 vessel CAD with significant current calcification of the LAD with up to 95% mid LAD stenosis and diffuse proximal stenosis. He underwent successful high-speed rotational atherectomy the following day by Dr. Martinique and a 1.5 mm bur was used. Mid LAD was stented with a 2.75x38 mm Promus stent in the proximal LAD was stented with a 3.0x28 mm Promus stent. Medical therapy was recommended for his distal LAD stenosis. He has only one kidney and staged intervention to the left circumflex coronary artery was recommended.  He underwent staged left circumflex PCI on 04/27/2013 by me with successful high-speed rotational atherectomy with a 1.5 and 1.75 mm burr, and ultimately had a 2.5x28 mm Promus premier DES stent inserted into the circumflex vessel, which was post dilated to approximately 2.6 mm.  Subsequently he has felt significantly improved with resolution of any chest pain  He has a history of significant hyperlipidemia and has had significant increased number of small LDL particles and insulin resistance. This seemed  to markedly improve with the addition of Niaspan added to zetia and lovaza.  Remotely, he had been on statins consisting of Lipitor and subsequently Crestor. He did have transient LFT elevation. He also concerns of possible risk of developing dementia with statin therapy.  He had  some episodes of Niaspan induced diffuse flushing. He wanted to stop taking his Niaspan. He did have subsequent NMR off Niaspan and done just on Zetia and as well as 2 g of Lovaza.  This showed increased abnormalities such that his total cholesterol was 201, LDL had risen to 124, but he now had LDL small particles which have increased from 685 to 1348. Laboratory on 10/26/2012: LDL particle number was elevated at 1657, LDL cholesterol 112 triglycerides 155 and total cholesterol 190. He continued to be insulin resistant with insulin resistance scored 65. When I last saw him, we elected to try Livalo and ultimately titrated this to 4 mg daily to take in addition to his Zetia 10 mg. Laboratory on 02/22/2013: LDL particle number was  markedly improved at 774 with an HDL of 40 calculated LDL 50 small LDL particle #417. Insulin resistance score was also improved at 51.  In July 2015 he developed abdominal discomfort and was found to have acute appendicitis. On CT imaging he was also noted to have prostate enlargement with nodularity and thickening of the bladder base and cystoscopy was suggested for further evaluation. He is status post left nephrectomy. He also was noted to have a 2.3 cm infrarenal suprailiac abdominal aortic aneurysm.  He has been on Livalo 2 mg, Zetia 10  mg for hyperlipidemia.  Lab work on 12/22/2013 showed a cholesterol 155, triglycerides 130, HDL 45, LDL 84.  He has been on amlodipine 10 mg atenolol 12.5 mg twice a day and valsartan 80 mg daily for blood pressure.  He continues to be on aspirin and Plavix for dual antiplatelet therapy.  A nuclear perfusion study on 06/30/2014  revealed normal perfusion and function with  an ejection fraction of 57%.    When he underwent a CT scan for his appendicitis he was told of having a small abdominal aortic aneurysm.  His mother also had an abdominal aortic aneurysm.  The patient recently sprained his right ankle.  As result, he has not been able to be as active as he had in the past and has not been able to play golf which typically he had played up to 4 times per week, often scoring in the 70s.    He was admitted to the hospital in April 2017 with complaints of increasing episodes of chest discomfort.  Troponins were mildly positive at 0.12, consistent with a non-STEMI.  I performed cardiac catheterization on 05/28/2015.  This showed widely patent stents in the LAD and circumflex vessels.  The RCA was diffusely diseased with calcification in the proximal to mid region with distal 99% stenosis and a noncalcified distal RCA segment immediately after the PDA takeoff.  ECI was difficult due to the calcified RCA preventing placement of a stent since the stent was never able to be passed beyond this calcified angled segment despite even attempting guide liner support.  Ultimately, he underwent successful PTCA of the 99% stenosis, which was reduced to 0%.  Since his intervention he had noticed dramatic improvement in his previous symptomatology.  He continues to feel well and is playing golf 3 times per week. He had a basal cell cancer removed from his nose.   When I saw him in April 2018 he was without chest pain or significant shortness of breath and was playing golf at least 3 days per week.  Laboratory showed a total cholesterol 147, triglycerides 151, HDL 43, LDL 74 on regimen of Zetia 10 mg and Livalo 2 mg.  In the past he's been intolerant to Crestor, Lipitor, and Zocor.  I further titrated Livalo 24 mg daily which  he has tolerated. An abdominal ultrasound showed aortoiliac atherosclerosis without aneurysm.   When I saw him in June 2018 he had begun to notice increasing shortness  of breath with walking. He denies any of the chest tightness or pressure that he had experienced prior to his interventions.  At his last catheterization, he had extensive calcification in his RCA and PTCA alone was done since the stent was unable to reach the subtotal distal RCA.  He denies recurrent chest tightness.  He was questioning whether or not he could have had  pneumonia comtributing to his shortness of breath.  At that evaluation, I further titrated his Imdur to 60 mg daily.  His blood pressure was also elevated and I titrated his ARB therapy.  He sagittally saw Caro Hight as an add-on in with complaints of increasing symptomatology definitive cardiac catheterization was recommended.  He underwent repeat cardiac catheterization on 08/05/2016 by Dr. Peter Martinique.  This continues to show patency of the stents in the proximal to mid LAD and patency of the stented the mid circumflex as well as patent PTCA site of the PL OM vessel.  There was no new disease to explain his symptoms.  He had  normal LV function and normal left ventricular end-diastolic pressure.  Following the catheterization, he has felt well, but he continues to experience some shortness of breath with weakness and decreased energy.  He denies any chest tightness.  Recent laboratory done 10 days ago revealed an elevated potassium at 5.7.  On recheck 3 days later was 5.4.  Subsequent, he has been taken off ARB therapy.    In August 2018 he was complaining of significant fatigue.  His blood pressure was stable and his pulse was 55.  I recommended he reduce his atenolol from 12.5 mg twice a day to just 12.5 mg at bedtime.  I also reduced his isosorbide from 60 mg down to 30 mg in light of his recent catheterization findings.  His prior hypokalemia, resolved with discontinuance of ARB, but more importantly with discontinuance of his excessive exogenous potassium intake with certain foods.   When I saw him in October 2018 he was fairly  stable. Over the winter months he has had difficulty with his back and required injections.  This has limited his exercise.  He admits to gaining weight.  He has noticed that he is more short of breath with walking up hills.  Blood pressures at home often been in the 140-150 range.  He denies any chest pressure.  He is unaware of palpitations.  He had repeat lab work one week ago.  Renal function was stable with a creatinine 1.15.  Potassium was 4.7.  LFTs were normal.  Hemoglobin and hematocrit were stable.  Lipid studies were excellent.   When I saw him in April 2019 his blood pressure recordings at home were in the 130-150 range and he had experience some shortness of breath.  I suggested a trial of HCTZ 12.5 mg every other day.  He was tolerating livalo 4 mg, vascepa and Zetia for hyperlipidemia.  Over the past 6 months, he has been without recurrent anginal symptoms.  He continues to play golf at least 3 days/week and typically score is 75 or below.  He underwent repeat laboratory last week and lipid studies remain excellent with total cholesterol 124, LDL 57, triglycerides 82, HDL 51.  Shortness of breath has improved.  He denies any orthopnea.   When I saw him in October 2019 at which time he was feeling well and his shortness of breath had improved.  His blood pressure was excellent on a regimen consisting of amlodipine 10 mg, atenolol 12.5 mg at bedtime and he had not had any recent swelling.  He was no longer taking HCTZ.  He was playing golf 3 days/week.   He was evaluated in a telemedicine visit on Jun 17, 2018.  At that time he was remaining stable and denied any symptoms of chest pain PND orthopnea.  He is still playing golf at least 3 days/week and his highest score is 82 over the past year.  He does not routinely exercise on the days he does not play golf.  He states his blood pressures typically has been running in the 416L with diastolics in the 84T to 36I.  He denied any palpitations,  episodes of presyncope or syncope.   I saw him as an add-on in the office on December 21, 2018 after he had begun to notice more exertional dyspnea as well as upper back discomfort which in the past may have been his anginal equivalent.  With progressive symptoms repeat echocardiography and definitive cardiac catheterization was recommended.    An echo Doppler study  on December 30, 2018 showed an EF of 55 to 60%.  There was mild aortic sclerosis.  Diastolic dysfunction was indeterminate.  Previously had been noted to have grade 1 diastolic dysfunction on prior echocardiography.  He underwent successful catheterization on January 04, 2019 which showed widely patent stents in the LAD and circumflex vessel.  The LAD had 40% stenosis in a small diagonal branch arising just distal to the stent in the mid LAD.  There was mild 20% mid LAD stenosis.  The circumflex vessel had smooth 20% narrowing proximal to the proximal stent.  There was mild 40 and 30% narrowings and small marginal branches. The RCA had mild CAD of 20 and 30% with calcification and there was 70% stenosis in a small acute marginal branch ostially.  Medical therapy was recommended.  I saw him for initial follow-up of his cardiac catheterization on January 13, 2019 at which time he was doing well on medical therapy.  When I last saw him in April 2016 he remained stable.  He was playing golf 3 times per week and still scoring in the low to mid 70s. He continued to be on amlodipine 10 mg, isosorbide 60 mg a atenolol 12.5 mg twice a day in addition to spironolactone both for blood pressure and his CAD.  He continues to be on DAPT with aspirin/Plavix.  He tolerates Livalo in addition to [employer].    I saw him in October 2021 at which time he continued to remain stable.  He had undergone a CT angio of his chest with contrast by his pulmonologist which showed minimal bibasilar subsegmental atelectasis.  He was noted to have coronary artery calcification as  well as aortic atherosclerosis.  There was no evidence for PE.  He continues to play golf at least 3 days/week and his scores are consistently below 78.  He continues to be on DAPT but denies bleeding.  He is on Zetia 10 mg in addition to Livalo 4 mg and Vascepa 1 capsule twice a day.  Recent lipid studies showed a total cholesterol 152, HDL 52, LDL 78 and triglycerides 122 from November 23, 2019.  Renal function remained stable and he is continue to be on amlodipine 10 mg, isosorbide 60 mg, atenolol 12.5 mg twice a day and spironolactone 12.5 mg daily.  He has not had any anginal symptomatology or change in exercise capacity.    I saw him on Jun 20, 2020 and since his prior evaluation he developed some hoarseness to his voice in December.  He subsequently underwent ENT evaluation by Dr. Clyde Canterbury and was found to have a friable 4 to 5 mm lesion of the left mid to anterior vocal cord.  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  Today he completed his 33rd and final radiation treatment by Dr. Donella Stade.  At his evaluation he denied any chest pain or shortness of breath.  He has been somewhat more fatigued with the radiation.  He recently underwent laboratory on Jun 12, 2020.  LDL cholesterol was improved at 69, total cholesterol 138, triglycerides were minimally increased at 157.  Creatinine was 1.30 with an estimated GFR at 53.  LFTs were normal.  TSH and CBC were normal.    I last saw him on January 04, 2021 at which time he continued to do well.  He continued to have a raspy voice with some improvement.  He denies chest pain or shortness of breath.  He did experience some mild throat swelling with radiation.  He does admit to some fatigue.  He continues to be on amlodipine 10 mg, atenolol 12.5 mg twice a day, and isosorbide 60 mg daily.  He was recently told to hold his spironolactone by his primary physician.  He continues to be on Livalo 4 mg, Zetia 10 mg, and Vascepa 1 g twice a day for mixed  hyperlipidemia.  Recent laboratory on December 20, 2020 showed an AST of 41 and ALT of 34.  Hemoglobin hematocrit were stable at 16.1/46.8.  TSH had increased to 6.28 from 3.94 in May 2022.  He completed radiation treatments with Dr. Donella Stade.  He was hospitalized on June 13 through June 15 with mild exertional shortness of breath and intermittent chest tightness.  High-sensitivity troponins were negative and he was chest pain free with increase of isosorbide to 60 mg twice a day and initiation of Ranexa 500 mg twice a day.  Presently, he feels well and has not had any recurrent chest pain on his increase medical therapy.  He continues to be active and plays golf 3 days/week.  He continues to be on amlodipine 5 mg, atenolol 37.5 mg twice a day, is now on furosemide 20 mg 2 days/week, isosorbide 60 mg twice a day, and Ranexa 500 mg twice a day.  He continues to be on Livalo 4 mg and Zetia 10 mg for hyperlipidemia.  He is on finasteride for his prostate.  He presents for evaluation.  Past Medical History:  Diagnosis Date   BPH (benign prostatic hyperplasia)    Followed by [name]/urology   CAD S/P PTCA only of RPAV-PL 05/31/2015   99% --> 0%PAV - PTCA only (unable to advance STENT) due to RCA calcification - prox & distal RCA 30%; Patent LAD stent & Cx stent (~20% ISR).    Calculus of kidney    "that's how they found the cancer" (04/27/2013)   Coronary artery disease involving native coronary artery of native heart with unstable angina pectoris ([place]) 12/03/2011   S/P Cardiac angioplasty 1986, 1991, 1993, 1997.  Last cardiac catheterization 2001.  Cardiolite 05/2011 low risk with normal EF 65%.  Followed by cardiology/[facility] every six months.    Dyspnea    only on exertion   History of myocardial infarction 1976   Hypertension    Impotence of organic origin    Internal hemorrhoids without mention of complication    Malignant neoplasm of kidney, except pelvis    Melanoma ([place]) 06/2010    Left arm   Melanoma of back ([place]) 02/07/2015   R upper back; excision [facility].   Myocardial infarction [facility])    Other abnormal blood chemistry    Other nonspecific abnormal serum enzyme levels    Personal history of colonic polyps    Pure hypercholesterolemia    Tobacco use disorder    Unspecified congenital cystic kidney disease    Unstable angina ([place]) 04/05/2013; 05/2015    Past Surgical History:  Procedure Laterality Date   APPENDECTOMY  2014   CARDIAC CATHETERIZATION N/A 05/30/2015   Procedure: Left Heart Cath and Coronary Angiography;  Surgeon: [name], MD;  Location: [place] [facility];  Service: Cardiovascular;  Laterality: N/A;   CARDIAC CATHETERIZATION N/A 05/30/2015   Procedure: Coronary Balloon Angioplasty;  Surgeon: [name], MD;  Location: [place] [facility];  Service: Cardiovascular;  Laterality: N/A;   Cardiolite  05/06/2011   low risk study; normal EF.  [facility]   carotid dopplers  03/08/2011   minimal  plaque formation B. Symptoms: dizziness.   COLONOSCOPY  10/05/2008   single polyp, [facility].  [name].  Repeat in 3 years.   CORONARY ANGIOPLASTY     "I've had 4" (04/27/2013)   CORONARY ANGIOPLASTY WITH STENT PLACEMENT  04/2013; 04/27/2013   "2 + 1" (04/27/2013)   EYE SURGERY  11/04/2013   Cataract surgery B; [facility].   LEFT HEART CATH AND CORONARY ANGIOGRAPHY N/A 08/05/2016   Procedure: Left Heart Cath and Coronary Angiography;  Surgeon: Martinique, Peter M, MD;  Location: [place] [facility];  Service: Cardiovascular;  Laterality: N/A;   LEFT HEART CATH AND CORONARY ANGIOGRAPHY N/A 01/04/2019   Procedure: LEFT HEART CATH AND CORONARY ANGIOGRAPHY;  Surgeon: [name], MD;  Location: [place] [facility];  Service: Cardiovascular;  Laterality: N/A;   MELANOMA EXCISION Left ~ 2007   "arm"   MELANOMA EXCISION  02/07/2015   R upper back. [facility]   MICROLARYNGOSCOPY N/A 04/06/2020   Procedure: MICROLARYNGOSCOPY WITH BIOPSY OF LARYNX;  Surgeon: Clyde Canterbury, MD;  Location: [facility];   Service: ENT;  Laterality: N/A;   NEPHRECTOMY Left 2006   PERCUTANEOUS CORONARY ROTOBLATOR INTERVENTION (PCI-R) N/A 04/06/2013   Procedure: PERCUTANEOUS CORONARY ROTOBLATOR INTERVENTION (PCI-R);  Surgeon: [name], MD;  Location[facility] C[facility];  Service: Cardiovascular;  Laterality: N/A;   PERCUTANEOUS CORONARY STENT INTERVENTION (PCI-S) N/A 04/27/2013   Procedure: PERCUTANEOUS CORONARY STENT INTERVENTION (PCI-S);  Surgeon: [name], MD;  Location: [facility] CATH LAB;  Service: Cardiovascular;  Laterality: N/A;    Allergies  Allergen Reactions   Angiotensin Receptor Blockers     Other reaction(s): Kidney Disorder Hyperkalemia   Lovastatin Other (See Comments)    Elevated liver enzymes   Morphine Hives   Nifedipine Other (See Comments)    Elevated liver enzymes    Current Outpatient Medications  Medication Sig Dispense Refill   acetaminophen (TYLENOL) 500 MG tablet Take 500 mg by mouth every 6 (six) hours as needed (for pain.).      amLODipine (NORVASC) 5 MG tablet Take 1 tablet (5 mg total) by mouth every evening. 30 tablet 0   aspirin 81 MG tablet Take 81 mg by mouth every evening.     atenolol (TENORMIN) 25 MG tablet TAKE ONE-HALF (1/2) TABLET  BY MOUTH TWICE A DAY 90 tablet 3   clopidogrel (PLAVIX) 75 MG tablet TAKE 1 TABLET BY MOUTH  DAILY 90 tablet 3   ezetimibe (ZETIA) 10 MG tablet TAKE 1 TABLET BY MOUTH  DAILY 90 tablet 3   finasteride (PROSCAR) 5 MG tablet Take 1 tablet (5 mg total) by mouth daily. (Patient taking differently: Take 2.5 mg by mouth daily.) 90 tablet 3   icosapent Ethyl (VASCEPA) 1 g capsule TAKE 1 CAPSULE BY MOUTH  TWICE DAILY 180 capsule 3   LIVALO 4 MG TABS TAKE 1 TABLET BY MOUTH IN  THE EVENING 90 tablet 3   nitroGLYCERIN (NITROSTAT) 0.4 MG SL tablet Place 1 tablet (0.4 mg total) under the tongue every 5 (five) minutes as needed for chest pain. 25 tablet 11   [START ON 09/03/2021] furosemide (LASIX) 20 MG tablet Take 1 tablet (20 mg total) by mouth 2 (two)  times a week. 16 tablet 2   isosorbide mononitrate (IMDUR) 60 MG 24 hr tablet Take 1 tablet (60 mg total) by mouth in the morning and at bedtime. 60 tablet 3   ranolazine (RANEXA) 500 MG 12 hr tablet Take 1 tablet (500 mg total) by mouth 2 (two) times  daily. 60 tablet 3   No current facility-administered medications for this visit.    Socially he remains active. He is married has 5 children 10 grandchildren 2 great-grandchildren. Is no alcohol use. He typically scores below 75 and plays golf 3 days per week.  He typically scores in the 70s, better than his age.  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 Clyde Canterbury Endocrine: Negative; no heat/cold intolerance; no diabetes Neuro: Negative; no changes in balance, headaches Skin: Negative; No rashes or skin lesions Psychiatric: Negative; No behavioral problems, depression Sleep: Negative; No snoring, daytime sleepiness, hypersomnolence, bruxism, restless legs, hypnogognic hallucinations, no cataplexy Other comprehensive 14 point system review is negative.   PE BP 132/76   Pulse 63   Ht 5' 7.5" (1.715 m)   Wt 187 lb 9.6 oz (85.1 kg)   SpO2 96%   BMI 28.95 kg/m    Repeat blood pressure by me was 126/70  Wt Readings from Last 3 Encounters:  08/31/21 187 lb 9.6 oz (85.1 kg)  07/27/21 191 lb (86.6 kg)  07/17/21 192 lb (87.1 kg)   General: Alert, oriented, no distress.  Prior raspy voice has improved Skin: normal turgor, no rashes, warm and dry HEENT:  Normocephalic, atraumatic. Pupils equal round and reactive to light; sclera anicteric; extraocular muscles intact;  Nose without nasal septal hypertrophy Mouth/Parynx benign; Mallinpatti scale 3 Neck: No JVD, no carotid bruits; normal carotid upstroke Lungs: clear to ausculatation and percussion; no wheezing or rales Chest wall: without tenderness to palpitation Heart: PMI not displaced, RRR, s1 s2 normal, 1/6 systolic murmur, no diastolic murmur, no rubs, gallops, thrills, or heaves Abdomen: soft, nontender; no hepatosplenomehaly, BS+; abdominal aorta nontender and not dilated by palpation. Back: no CVA tenderness Pulses 2+ Musculoskeletal: full range of motion, normal strength, no joint deformities Extremities: no clubbing cyanosis or edema, Homan's sign negative  Neurologic: grossly nonfocal; Cranial nerves grossly wnl Psychologic: Normal mood and affect   August 31, 2021 ECG (independently read by me):  Sinus rhythm at 63, PVCs  January 04, 2021 ECG (independently read by me): Sinu bradycardia at 58; NSSTT changes, no ectopy   Jun 20, 2020 ECG (independently read by me): Normal sinus rhythm at 62 bpm.  No ectopy.  Normal intervals    October 2021 ECG (independently read by me):  Sinus bradycardia at 56; no ectopy, normal intervals    May 21, 2019 ECG (independently read by me): Sinus bradycardia 54 bpm.  PR interval 188 ms, QTc interval 421 ms.  No ectopy.  January 13, 2019 ECG (independently read by me): Sinus bradycardia 54 bpm.  No ectopy.  PR interval 192 ms, QTc interval 4 3 ms  December 21, 2018 ECG (independently read by me): Normal sinus rhythm at 77 bpm.  Nonspecific ST changes.  Normal intervals  October 20119 ECG (independently read by me): Normal sinus rhythm at 61 bpm.  No ectopy.  Normal intervals.  April 2019 ECG (independently read by me):normal sinus rhythm at 61 bpm.  No ectopy.  No ST segment changes.  October 2018 ECG (independently read by me): Sinus  bradycardia 57  bpm.  No ST segment changes.  Normal intervals.  August 2018 ECG (independently read by me): Sinus bradycardia 55 bpm.  Normal intervals.  No significant ST-T changes  June 2018 ECG (independently read by me): Sinus bradycardia 58 bpm.  Normal intervals.  No significant ST-T changes.  April 2018 ECG (independently read by me): Normal sinus rhythm at 63 bpm.  Normal intervals.  No ST segment changes.  September 2017 ECG (independently read by me): Normal sinus rhythm at 60 bpm.  Nonspecific T changes.  Intervals are normal.  May 2017 ECG (independently read by me): Sinus bradycardia 55 bpm.  No ectopy.  Normal intervals.  Nondiagnostic T changes in lead 3.  April 2017 ECG (independently read by me): Sinus bradycardia 55 bpm.  No ectopy.  No significant ST changes.  Normal intervals.  01/12/2015 ECG (independently read by me): Sinus bradycardia 54 bpm.  No ectopy.  Normal intervals.  June 2016 ECG (independently read by me): Sinus bradycardia 58 bpm.  Normal intervals.  No ectopy. Non-specific T change aVL  November 2015 ECG (independently read by me): Sinus bradycardia 54 bpm.  No significant ST-T changes.  Normal intervals.  May 2015 ECG (independently read by me): Sinus bradycardia 54 beats per minute.  No ectopy.  QTc interval 396 ms.  No significant ST changes.  04/22/2013 ECG (independently read by me): Sinus bradycardia 55 beats per minute. Nonspecific ST changes  03/01/2013 ECG (independently read by me): Sinus bradycardia 52 beats per minute. Normal intervals. No significant ST changes.  Prior ECG of 11/20/2012: Sinus rhythm at 51 beats per minute. QTc interval 400 ms. No significant ST changes.  LABS:     Latest Ref Rng & Units 08/09/2021    2:03 PM 07/27/2021    9:12 AM 07/18/2021    8:19 AM  BMP  Glucose 70 - 99 mg/dL 99  109  103   BUN 8 - 27 mg/dL '17  18  18   ' Creatinine 0.76 - 1.27 mg/dL 1.29  1.34  1.02   BUN/Creat Ratio 10 - '24 13  13    ' Sodium 134 -  144 mmol/L 141  146  137   Potassium 3.5 - 5.2 mmol/L 4.9  5.4  4.1   Chloride 96 - 106 mmol/L 104  105  106   CO2 20 - 29 mmol/L '23  27  26   ' Calcium 8.6 - 10.2 mg/dL 9.6  9.9  8.9       Latest Ref Rng & Units 07/27/2021    9:12 AM 12/20/2020    7:06 AM 06/12/2020    7:43 AM  Hepatic Function  Total Protein 6.0 - 8.5 g/dL 7.6  6.9  6.8   Albumin 3.6 - 4.6 g/dL 4.6  4.3  4.3   AST 0 - 40 IU/L 38  41  32   ALT 0 - 44 IU/L 36  34  30   Alk Phosphatase 44 - 121 IU/L 62  65  70   Total Bilirubin 0.0 - 1.2 mg/dL 0.7  0.8  0.9       Latest Ref Rng & Units 07/27/2021    9:12 AM 07/17/2021    3:15 PM 12/20/2020    7:06 AM  CBC  WBC 3.4 - 10.8 x10E3/uL 6.9  6.7  6.4   Hemoglobin 13.0 - 17.7 g/dL 17.1  16.0  16.1   Hematocrit 37.5 - 51.0 % 52.0  49.3  46.8   Platelets 150 - 450 x10E3/uL 230  215  231    Lab Results  Component Value Date   MCV 89 07/27/2021   MCV 88.4 07/17/2021   MCV 86 12/20/2020   Lab Results  Component Value Date   TSH 6.280 (H) 12/20/2020  .  Lipid Panel     Component Value Date/Time   CHOL 147 12/20/2020 0706   TRIG 118 12/20/2020 0706   TRIG 131 02/22/2013 0804   HDL 47 12/20/2020 0706   HDL 40 02/22/2013 0804   CHOLHDL 3.1 12/20/2020 0706   CHOLHDL 3.5 05/09/2015 1400   VLDL 31 (H) 05/09/2015 1400   LDLCALC 79 12/20/2020 0706   LDLCALC 50 02/22/2013 0804   RADIOLOGY: Dg Chest 2 View  08/17/2012   *RADIOLOGY REPORT*  Clinical Data: Renal cell carcinoma  CHEST - 2 VIEW  Comparison: 05/08/11  Findings: The cardiomediastinal silhouette is stable.  No acute infiltrate or pleural effusion.  No pulmonary edema.  Bony thorax is unremarkable.  IMPRESSION: No active disease.  No significant change.   Original Report Authenticated By: Lahoma Crocker, M.D.    CV STUDIES:  Echo 12/30/2018 Left ventricular ejection fraction, by visual estimation, is 55 to 60%. The left ventricle has normal function. There is no left ventricular hypertrophy. 2. Left ventricular  diastolic parameters are indeterminate. 3. The left ventricle has no regional wall motion abnormalities. 4. Global right ventricle has normal systolic function.The right ventricular size is normal. No increase in right ventricular wall thickness. 5. Left atrial size was mildly dilated. 6. Right atrial size was normal. 7. The mitral valve is normal in structure. No evidence of mitral valve regurgitation. No evidence of mitral stenosis. 8. The tricuspid valve is normal in structure. Tricuspid valve regurgitation is not demonstrated. 9. The aortic valve is normal in structure. Aortic valve regurgitation is trivial. Mild aortic valve sclerosis without stenosis. 10. The pulmonic valve was grossly normal. Pulmonic valve regurgitation is not visualized. 11. The inferior vena cava is normal in size with greater than 50% respiratory variability, suggesting right atrial pressure of 3 mmHg.   CARDIAC CATHETERIZATION: 01/04/2019  Mid LM to Mid LAD lesion is 10% stenosed. 2nd Diag lesion is 40% stenosed. Mid LAD to Dist LAD lesion is 20% stenosed. Prox Cx to Mid Cx lesion is 5% stenosed. Ost Cx to Prox Cx lesion is 20% stenosed. 1st Mrg lesion is 40% stenosed. 2nd Mrg lesion is 30% stenosed. Ost RCA to Mid RCA lesion is 20% stenosed. Acute Mrg lesion is 70% stenosed. Dist RCA lesion is 30% stenosed. RPAV lesion is 20% stenosed. The left ventricular systolic function is normal. LV end diastolic pressure is normal. RPDA lesion is 40% stenosed.   Widely patent stents in the LAD and circumflex vessel.  The LAD has 40% stenosis in a small diagonal branch arising just distal to the stent in the mid LAD.  There is mild 20% mid LAD narrowing; complex vessel has smooth 20% narrowing proximal to the stented segment.  Small marginal branches arising within the circumflex stent had ostial narrowing of 40 and 30%; the  right coronary artery has moderate diffuse calcification with irregularity with narrowing  of 20 to 30% in the mid vessel, 30% prior to PDA, and with no significant restenosis at the site of distal PTCA.  A small acute marginal branch has 70 to 80% ostial narrowing in a small caliber vessel.   LVEDP 17 - 23 MM hG.   RECOMMENDATION: The catheterization study is not significantly changed from the patient's last catheterization in  July 2018.  Atherectomy sites with stenting in the LAD and circumflex vessel remain widely patent.  There is mild small branch ostial narrowings.  The RCA is calcified with patent PTCA site.  Medical therapy will be continued.  Continue long-term DAPT as the patient has been on aspirin/Plavix.  Continue aggressive lipid-lowering therapy with target LDL less than 70.  Exercise prescription.        IMPRESSION:  1. CAD in native artery: Status post multiple interventions   2. Chronic diastolic CHF (congestive heart failure) ([place])   3. Hyperlipidemia with target LDL less than 70   4. Grade I diastolic dysfunction   5. 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 Essential hypertension, benign     ASSESSMENT AND PLAN: Nicholas Douglas is a young appearing 88 year old gentleman who has CAD dating back to 1 and had undergone multiple interventions over a10 year period from 85 to 1997. He developed unstable angina symptomatology in March 2015 and catheterization done initially at [facility] showed multivessel disease.  He underwent successful staged rotational coronary atherectomy initially involving the RCA in early March 2015 , and on 04/27/2013 repeat intervention was done to the left circumflex system with rotational atherectomy.  At that time, he had a widely patent stent of his RCA, as well as a widely patent stent in his LAD.  He also had 60-70% mid LAD stenosis, which had not progressed. In 2017 he noticed a change in symptomatology with the development of more shortness of breath, and no energy and felt occasional episodes of vague chest discomfort with  possible left arm radiation.  He was hospitalized in late April 2017 and was found to have progressive 99% distal RCA stenosis after the PDA takeoff.  His stents in the LAD and circumflex were widely patent.  There was mild concomitant CAD in the circumflex.  His RCA was diffusely diseased and calcified with narrowings of 30% of the mid segment, but there was an angulated area of narrowing calcified narrowing of 40% before the acute margin, which prevented a stent from getting beyond this.  He underwent successful PTCA  to the distal stenosis.  At catheterization in July 2018 his intervention sites were patent and there was no significant CAD progression. There was mild 30-35% narrowing in the circumflex and mild 40% narrowing in the RCA.  The stents were patent as was the prior PTCA site.  He has had issues with shortness of breath.  ARB therapy had to be discontinued secondary to hyperkalemia, but at the time he was also having a fair amount of exogenous potassium intake.  Because of progressive symptoms of increasing shortness of breath and upper back discomfort which he believed may have been his anginal equivalent definitive catheterization was  performed in November 2020.  The LAD and circumflex stents were patent his distal RCA PTCA site was without restenosis.  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  Since his last evaluation with me in December 2022, he was admitted to the hospital on June 13 and discharged on July 19, 2021 with increasing shortness of breath and some vague chest tightness.  I reviewed his echo Doppler study from July 18, 2021 which showed EF 55 to 60% with grade 1 diastolic dysfunction and mild LVH.  There was mild MR and aortic valve sclerosis.  His symptoms improved with diuresis and his isosorbide was titrated to 60 mg twice a  day and Ranexa 500 mg was initiated.  Presently he is pain-free and in  addition to the increased isosorbide and Ranexa he is on furosemide 20 mg now on Tuesdays and Thursdays and amlodipine 5 mg daily.  He continues to be on DAPT with aspirin/Plavix.  He is on Zetia and Livalo for hyperlipidemia.  He sees Dr. Emily Filbert for primary care and repeat laboratory will be obtained.  I renewed his medications and will see him in 6 months for follow-up evaluation.  [name], MD, [facility]  09/02/2021 4:15 PM

## 2021-08-31 NOTE — Patient Instructions (Signed)
Medication Instructions:  The current medical regimen is effective;  continue present plan and medications.  *If you need a refill on your cardiac medications before your next appointment, please call your pharmacy*   Follow-Up: At [facility], you and your health needs are our priority.  As part of our continuing mission to provide you with exceptional heart care, we have created designated Provider Care Teams.  These Care Teams include your primary Cardiologist (physician) and Advanced Practice Providers (APPs -  Physician Assistants and Nurse Practitioners) who all work together to provide you with the care you need, when you need it.  We recommend signing up for the patient portal called "MyChart".  Sign up information is provided on this After Visit Summary.  MyChart is used to connect with patients for Virtual Visits (Telemedicine).  Patients are able to view lab/test results, encounter notes, upcoming appointments, etc.  Non-urgent messages can be sent to your provider as well.   To learn more about what you can do with MyChart, go to [email or web address].    Your next appointment:   6 month(s)  The format for your next appointment:   In Person  Provider:   Thomas Kelly, MD    

## 2021-09-02 ENCOUNTER — Encounter: Payer: Self-pay | Admitting: Cardiovascular Disease

## 2021-10-07 ENCOUNTER — Other Ambulatory Visit: Payer: Self-pay | Admitting: Cardiovascular Disease

## 2021-11-01 ENCOUNTER — Other Ambulatory Visit: Payer: Self-pay | Admitting: Cardiovascular Disease

## 2021-11-05 ENCOUNTER — Other Ambulatory Visit: Payer: Self-pay | Admitting: Cardiovascular Disease

## 2021-11-12 ENCOUNTER — Other Ambulatory Visit: Payer: Self-pay | Admitting: Cardiovascular Disease

## 2021-12-24 ENCOUNTER — Telehealth: Payer: Self-pay | Admitting: Cardiovascular Disease

## 2021-12-24 MED ORDER — PITAVASTATIN CALCIUM 4 MG PO TABS: 1.0000 | ORAL_TABLET | Freq: Every evening | ORAL | 3 refills | Status: DC

## 2021-12-24 NOTE — Telephone Encounter (Signed)
*  STAT* If patient is at the pharmacy, call can be transferred to refill team.   1. Which medications need to be refilled? (please list name of each medication and dose if known)  LIVALO 4 MG TABS  2. Which pharmacy/location (including street and city if local pharmacy) is medication to be sent to?  [facility] [place], [place]    3. Do they need a 30 day or 90 day supply?   90 day supply

## 2021-12-25 ENCOUNTER — Telehealth: Payer: Self-pay | Admitting: Cardiovascular Disease

## 2021-12-25 NOTE — Telephone Encounter (Signed)
  Pt c/o medication issue:  1. Name of Medication: Pitavastatin Calcium (LIVALO) 4 MG TABS   2. How are you currently taking this medication (dosage and times per day)?   Take 1 tablet (4 mg total) by mouth every evening.    3. Are you having a reaction (difficulty breathing--STAT)? No   4. What is your medication issue? Pt's wife calling, she said, per pharmacy they need prior auth for this medication. She gave phone number to call [phone]

## 2021-12-26 NOTE — Telephone Encounter (Signed)
**Note De-Identified Tiare Rohlman Obfuscation** Mack Guise (KeyColbert Ewing) PA Case ID #: [id number]  Outcome This medication or product is on your plan's list of covered drugs. Prior authorization is not required at this time. If your pharmacy has questions regarding the processing of your prescription, please have them call the [facility] pharmacy help desk at [phone]. **Please note: This request was submitted electronically. Formulary lowering, tiering exception, cost reduction and/or pre-benefit determination review (including prospective Medicare hospice reviews) requests cannot be requested using this method of submission. Providers contact us at [phone] for further assistance. Drug Livalo '4MG'$  tablets ePA cloud logo Form [facility] Medicare Part D Electronic Prior Authorization Form [phone] NCPDP)  [facility] ([place], [place] (Ph: [phone]) is aware of this outcome.

## 2021-12-30 ENCOUNTER — Other Ambulatory Visit: Payer: Self-pay | Admitting: Cardiovascular Disease

## 2022-03-14 ENCOUNTER — Emergency Department: Payer: Medicare Other

## 2022-03-14 ENCOUNTER — Emergency Department
Admission: EM | Admit: 2022-03-14 | Discharge: 2022-03-14 | Disposition: A | Discharge status: Home / Self Care | Payer: Medicare Other | Attending: Emergency Medicine | Admitting: Emergency Medicine

## 2022-03-14 ENCOUNTER — Other Ambulatory Visit: Payer: Self-pay | Admitting: Cardiovascular Disease

## 2022-03-14 ENCOUNTER — Other Ambulatory Visit: Payer: Self-pay

## 2022-03-14 DIAGNOSIS — R42 Dizziness and giddiness: Secondary | ICD-10-CM | POA: Diagnosis present

## 2022-03-14 DIAGNOSIS — I2510 Atherosclerotic heart disease of native coronary artery without angina pectoris: Secondary | ICD-10-CM | POA: Diagnosis not present

## 2022-03-14 DIAGNOSIS — I129 Hypertensive chronic kidney disease with stage 1 through stage 4 chronic kidney disease, or unspecified chronic kidney disease: Secondary | ICD-10-CM | POA: Insufficient documentation

## 2022-03-14 DIAGNOSIS — N189 Chronic kidney disease, unspecified: Secondary | ICD-10-CM | POA: Insufficient documentation

## 2022-03-14 DIAGNOSIS — I10 Essential (primary) hypertension: Secondary | ICD-10-CM

## 2022-03-14 DIAGNOSIS — Z905 Acquired absence of kidney: Secondary | ICD-10-CM | POA: Insufficient documentation

## 2022-03-14 DIAGNOSIS — Z85528 Personal history of other malignant neoplasm of kidney: Secondary | ICD-10-CM | POA: Diagnosis not present

## 2022-03-14 LAB — BASIC METABOLIC PANEL
Anion gap: 9 (ref 5–15)
BUN: 15 mg/dL (ref 8–23)
CO2: 26 mmol/L (ref 22–32)
Calcium: 9.1 mg/dL (ref 8.9–10.3)
Chloride: 101 mmol/L (ref 98–111)
Creatinine, Ser: 1.18 mg/dL (ref 0.61–1.24)
GFR, Estimated: 59 mL/min — ABNORMAL LOW (ref >60)
Glucose, Bld: 92 mg/dL (ref 70–99)
Potassium: 3.8 mmol/L (ref 3.5–5.1)
Sodium: 136 mmol/L (ref 135–145)

## 2022-03-14 LAB — CBC
HCT: 50.5 % (ref 39.0–52.0)
Hemoglobin: 16.6 g/dL (ref 13.0–17.0)
MCH: 29.3 pg (ref 26.0–34.0)
MCHC: 32.9 g/dL (ref 30.0–36.0)
MCV: 89.2 fL (ref 80.0–100.0)
Platelets: 228 10*3/uL (ref 150–400)
RBC: 5.66 MIL/uL (ref 4.22–5.81)
RDW: 12.3 % (ref 11.5–15.5)
WBC: 6.9 10*3/uL (ref 4.0–10.5)
nRBC: 0 % (ref 0.0–0.2)

## 2022-03-14 LAB — TROPONIN I (HIGH SENSITIVITY): Troponin I (High Sensitivity): 6 ng/L (ref <18)

## 2022-03-14 NOTE — ED Triage Notes (Signed)
Pt to ED via POV c/o high blood pressure. Pt states it has been elevated for the last 2-3 days but got really high today, systolic over 974. Pt took morning dose of blood pressure medication. Pt denies CP, SOB, fevers.

## 2022-03-14 NOTE — ED Provider Notes (Signed)
[facility] Provider Note    Event Date/Time   First MD Initiated Contact with Patient 03/14/22 2041     (approximate)   History   Chief Complaint Hypertension   HPI  Nicholas Douglas is a 89 y.o. male with past medical history of hypertension, hyperlipidemia, CAD, CKD, AAA, and renal cell carcinoma status post nephrectomy who presents to the ED complaining of hypertension.  Patient reports that for the past week he has noticed that his blood pressures have been running high, as high as 161 systolic earlier today.  He reports feeling intermittently dizzy over the past couple of days, which she denies as feeling either like a lightheadedness or the room spinning around him.  He denies any vision changes, speech changes, numbness, or weakness in his extremities.  He has not had any chest pain, shortness of breath, or difficulty urinating.  He reports being compliant with his blood pressure medications.     Physical Exam   Triage Vital Signs: ED Triage Vitals  Enc Vitals Group     BP 03/14/22 1915 (!) 176/78     Pulse Rate 03/14/22 1915 (!) 58     Resp 03/14/22 1915 17     Temp 03/14/22 1915 97.8 F (36.6 C)     Temp Source 03/14/22 1915 Oral     SpO2 03/14/22 1915 95 %     Weight 03/14/22 1919 187 lb (84.8 kg)     Height 03/14/22 1919 '5\' 7"'$  (1.702 m)     Head Circumference --      Peak Flow --      Pain Score 03/14/22 1916 0     Pain Loc --      Pain Edu? --      Excl. in [place]? --     Most recent vital signs: Vitals:   03/14/22 1915 03/14/22 2100  BP: (!) 176/78 (!) 179/80  Pulse: (!) 58   Resp: 17   Temp: 97.8 F (36.6 C)   SpO2: 95%     Constitutional: Alert and oriented. Eyes: Conjunctivae are normal. Head: Atraumatic. Nose: No congestion/rhinnorhea. Mouth/Throat: Mucous membranes are moist.  Cardiovascular: Normal rate, regular rhythm. Grossly normal heart sounds.  2+ radial pulses bilaterally. Respiratory: Normal respiratory effort.   No retractions. Lungs CTAB. Gastrointestinal: Soft and nontender. No distention. Musculoskeletal: No lower extremity tenderness nor edema.  Neurologic:  Normal speech and language. No gross focal neurologic deficits are appreciated.    ED Results / Procedures / Treatments   Labs (all labs ordered are listed, but only abnormal results are displayed) Labs Reviewed  BASIC METABOLIC PANEL - Abnormal; Notable for the following components:      Result Value   GFR, Estimated 59 (*)    All other components within normal limits  CBC  TROPONIN I (HIGH SENSITIVITY)     EKG  ED ECG REPORT I, Blake Divine, the attending physician, personally viewed and interpreted this ECG.   Date: 03/14/2022  EKG Time: 19:20  Rate: 55  Rhythm: sinus bradycardia  Axis: Normal  Intervals:none  ST&T Change: None  RADIOLOGY Chest x-ray reviewed and interpreted by me with no infiltrate, edema, or effusion.  PROCEDURES:  Critical Care performed: No  Procedures   MEDICATIONS ORDERED IN ED: Medications - No data to display   IMPRESSION / MDM / [place] / ED COURSE  I reviewed the triage vital signs and the nursing notes.  89 y.o. male with past medical history of hypertension, hyperlipidemia, CAD, CKD, AAA, and renal cell carcinoma status post nephrectomy who presents to the ED complaining of elevated blood pressure at home for the past week, now with dizziness intermittently over the past 2 days.  Patient's presentation is most consistent with acute presentation with potential threat to life or bodily function.  Differential diagnosis includes, but is not limited to, stroke, intracranial hemorrhage, ACS, electrolyte abnormality, AKI, benign hypertension.  Patient well-appearing and in no acute distress, vital signs are remarkable for mildly elevated blood pressure but otherwise reassuring.  EKG shows no evidence of arrhythmia or ischemia and troponin  within normal limits, doubt ACS or dissection.  No significant anemia, leukocytosis, electrode abnormality, or AKI noted.  He has no focal neurologic deficits on exam but given his described dizziness, we will check CT of his head.  CT head negative for acute process, patient with minimal dizziness at this time and I doubt stroke.  He is appropriate for discharge home with PCP follow-up for recheck of blood pressure, was counseled to continue taking medications as prescribed.  He was counseled to return to the ED for new or worsening symptoms, patient agrees with plan.      FINAL CLINICAL IMPRESSION(S) / ED DIAGNOSES   Final diagnoses:  Uncontrolled hypertension  Dizziness     Rx / DC Orders   ED Discharge Orders     None        Note:  This document was prepared using Dragon voice recognition software and may include unintentional dictation errors.   Blake Divine, MD 03/14/22 2218

## 2022-03-15 LAB — CBC
Hematocrit: 49 % (ref 37.5–51.0)
Hemoglobin: 16.2 g/dL (ref 13.0–17.7)
MCH: 29.8 pg (ref 26.6–33.0)
MCHC: 33.1 g/dL (ref 31.5–35.7)
MCV: 90 fL (ref 79–97)
Platelets: 221 10*3/uL (ref 150–450)
RBC: 5.44 x10E6/uL (ref 4.14–5.80)
RDW: 12.2 % (ref 11.6–15.4)
WBC: 6.6 10*3/uL (ref 3.4–10.8)

## 2022-03-15 LAB — COMPREHENSIVE METABOLIC PANEL
ALT: 28 IU/L (ref 0–44)
AST: 31 IU/L (ref 0–40)
Albumin/Globulin Ratio: 1.7 (ref 1.2–2.2)
Albumin: 4.5 g/dL (ref 3.7–4.7)
Alkaline Phosphatase: 62 IU/L (ref 44–121)
BUN/Creatinine Ratio: 10 (ref 10–24)
BUN: 13 mg/dL (ref 8–27)
Bilirubin Total: 1 mg/dL (ref 0.0–1.2)
CO2: 26 mmol/L (ref 20–29)
Calcium: 9.7 mg/dL (ref 8.6–10.2)
Chloride: 101 mmol/L (ref 96–106)
Creatinine, Ser: 1.34 mg/dL — ABNORMAL HIGH (ref 0.76–1.27)
Globulin, Total: 2.7 g/dL (ref 1.5–4.5)
Glucose: 106 mg/dL — ABNORMAL HIGH (ref 70–99)
Potassium: 5.1 mmol/L (ref 3.5–5.2)
Sodium: 142 mmol/L (ref 134–144)
Total Protein: 7.2 g/dL (ref 6.0–8.5)
eGFR: 51 mL/min/{1.73_m2} — ABNORMAL LOW (ref >59)

## 2022-03-15 LAB — LIPID PANEL
Chol/HDL Ratio: 2.7 ratio (ref 0.0–5.0)
Cholesterol, Total: 147 mg/dL (ref 100–199)
HDL: 54 mg/dL (ref >39)
LDL Chol Calc (NIH): 73 mg/dL (ref 0–99)
Triglycerides: 108 mg/dL (ref 0–149)
VLDL Cholesterol Cal: 20 mg/dL (ref 5–40)

## 2022-03-15 LAB — TSH: TSH: 7.8 u[IU]/mL — ABNORMAL HIGH (ref 0.450–4.500)

## 2022-03-18 ENCOUNTER — Ambulatory Visit: Payer: Medicare Other | Attending: Cardiovascular Disease | Admitting: Cardiovascular Disease

## 2022-03-18 ENCOUNTER — Encounter: Payer: Self-pay | Admitting: Cardiovascular Disease

## 2022-03-18 VITALS — HR 55 | Ht 67.5 in | Wt 191.0 lb

## 2022-03-18 DIAGNOSIS — E785 Hyperlipidemia, unspecified: Secondary | ICD-10-CM

## 2022-03-18 DIAGNOSIS — I25119 Atherosclerotic heart disease of native coronary artery with unspecified angina pectoris: Secondary | ICD-10-CM | POA: Diagnosis not present

## 2022-03-18 DIAGNOSIS — C320 Malignant neoplasm of glottis: Secondary | ICD-10-CM

## 2022-03-18 DIAGNOSIS — I10 Essential (primary) hypertension: Secondary | ICD-10-CM

## 2022-03-18 DIAGNOSIS — I2510 Atherosclerotic heart disease of native coronary artery without angina pectoris: Secondary | ICD-10-CM

## 2022-03-18 DIAGNOSIS — I5189 Other ill-defined heart diseases: Secondary | ICD-10-CM

## 2022-03-18 MED ORDER — ICOSAPENT ETHYL 1 G PO CAPS: 1.0000 g | ORAL_CAPSULE | Freq: Two times a day (BID) | ORAL | 3 refills | Status: DC

## 2022-03-18 MED ORDER — ATENOLOL 25 MG PO TABS: 12.5000 mg | ORAL_TABLET | Freq: Every day | ORAL | 3 refills | Status: DC

## 2022-03-18 NOTE — Progress Notes (Signed)
Patient ID: Nicholas Douglas, male   DOB: January 27, 1932, 89 y.o.   MRN: [id number]      HPI: Nicholas Douglas is an 89 y.o. Caucasian male who presents to the office today for a 7 month cardiology evaluation.  Nicholas Douglas has known CAD and underwent numerous interventions dating back to 66, 1991, 1993, and in 1997. Cardiac catheterization in 2011 which showed preserved LV function with mild residual distal inferior apical hypocontractility. There is evidence for coronary calcification with segmental narrowing of his LAD of 30-40% proximally, 50% diffusely, in the midsegment, and 70-80% in the distal region, he had AV groove circumflex stenoses of 70 and 50% with a 40% OM 2 stenosis, and a 30-40 and 50% RCA stenoses with 80-90% stenosis in the acute marginal branch. He had been on medical therapy.  He presented to [facility] in March 2015 with class IV angina and catheterization demonstrated severe 2 vessel CAD with significant current calcification of the LAD with up to 95% mid LAD stenosis and diffuse proximal stenosis. He underwent successful high-speed rotational atherectomy the following day by Dr. Martinique and a 1.5 mm bur was used. Mid LAD was stented with a 2.75x38 mm Promus stent in the proximal LAD was stented with a 3.0x28 mm Promus stent. Medical therapy was recommended for his distal LAD stenosis. He has only one kidney and staged intervention to the left circumflex coronary artery was recommended.  He underwent staged left circumflex PCI on 04/27/2013 by me with successful high-speed rotational atherectomy with a 1.5 and 1.75 mm burr, and ultimately had a 2.5x28 mm Promus premier DES stent inserted into the circumflex vessel, which was post dilated to approximately 2.6 mm.  Subsequently he has felt significantly improved with resolution of any chest pain  He has a history of significant hyperlipidemia and has had significant increased number of small LDL particles and insulin resistance. This seemed  to markedly improve with the addition of Niaspan added to zetia and lovaza.  Remotely, he had been on statins consisting of Lipitor and subsequently Crestor. He did have transient LFT elevation. He also concerns of possible risk of developing dementia with statin therapy.  He had  some episodes of Niaspan induced diffuse flushing. He wanted to stop taking his Niaspan. He did have subsequent NMR off Niaspan and done just on Zetia and as well as 2 g of Lovaza.  This showed increased abnormalities such that his total cholesterol was 201, LDL had risen to 124, but he now had LDL small particles which have increased from 685 to 1348. Laboratory on 10/26/2012: LDL particle number was elevated at 1657, LDL cholesterol 112 triglycerides 155 and total cholesterol 190. He continued to be insulin resistant with insulin resistance scored 65. When I last saw him, we elected to try Livalo and ultimately titrated this to 4 mg daily to take in addition to his Zetia 10 mg. Laboratory on 02/22/2013: LDL particle number was  markedly improved at 774 with an HDL of 40 calculated LDL 50 small LDL particle #417. Insulin resistance score was also improved at 51.  In July 2015 he developed abdominal discomfort and was found to have acute appendicitis. On CT imaging he was also noted to have prostate enlargement with nodularity and thickening of the bladder base and cystoscopy was suggested for further evaluation. He is status post left nephrectomy. He also was noted to have a 2.3 cm infrarenal suprailiac abdominal aortic aneurysm.  He has been on Livalo 2 mg, Zetia  10 mg for hyperlipidemia.  Lab work on 12/22/2013 showed a cholesterol 155, triglycerides 130, HDL 45, LDL 84.  He has been on amlodipine 10 mg atenolol 12.5 mg twice a day and valsartan 80 mg daily for blood pressure.  He continues to be on aspirin and Plavix for dual antiplatelet therapy.  A nuclear perfusion study on 06/30/2014  revealed normal perfusion and function with  an ejection fraction of 57%.    When he underwent a CT scan for his appendicitis he was told of having a small abdominal aortic aneurysm.  His mother also had an abdominal aortic aneurysm.  The patient recently sprained his right ankle.  As result, he has not been able to be as active as he had in the past and has not been able to play golf which typically he had played up to 4 times per week, often scoring in the 70s.    He was admitted to the hospital in April 2017 with complaints of increasing episodes of chest discomfort.  Troponins were mildly positive at 0.12, consistent with a non-STEMI.  I performed cardiac catheterization on 05/28/2015.  This showed widely patent stents in the LAD and circumflex vessels.  The RCA was diffusely diseased with calcification in the proximal to mid region with distal 99% stenosis and a noncalcified distal RCA segment immediately after the PDA takeoff.  ECI was difficult due to the calcified RCA preventing placement of a stent since the stent was never able to be passed beyond this calcified angled segment despite even attempting guide liner support.  Ultimately, he underwent successful PTCA of the 99% stenosis, which was reduced to 0%.  Since his intervention he had noticed dramatic improvement in his previous symptomatology.  He continues to feel well and is playing golf 3 times per week. He had a basal cell cancer removed from his nose.   When I saw him in April 2018 he was without chest pain or significant shortness of breath and was playing golf at least 3 days per week.  Laboratory showed a total cholesterol 147, triglycerides 151, HDL 43, LDL 74 on regimen of Zetia 10 mg and Livalo 2 mg.  In the past he's been intolerant to Crestor, Lipitor, and Zocor.  I further titrated Livalo 24 mg daily which  he has tolerated. An abdominal ultrasound showed aortoiliac atherosclerosis without aneurysm.   When I saw him in June 2018 he had begun to notice increasing shortness  of breath with walking. He denies any of the chest tightness or pressure that he had experienced prior to his interventions.  At his last catheterization, he had extensive calcification in his RCA and PTCA alone was done since the stent was unable to reach the subtotal distal RCA.  He denies recurrent chest tightness.  He was questioning whether or not he could have had  pneumonia comtributing to his shortness of breath.  At that evaluation, I further titrated his Imdur to 60 mg daily.  His blood pressure was also elevated and I titrated his ARB therapy.  He sagittally saw Caro Hight as an add-on in with complaints of increasing symptomatology definitive cardiac catheterization was recommended.  He underwent repeat cardiac catheterization on 08/05/2016 by Dr. Peter Martinique.  This continues to show patency of the stents in the proximal to mid LAD and patency of the stented the mid circumflex as well as patent PTCA site of the PL OM vessel.  There was no new disease to explain his symptoms.  He  had normal LV function and normal left ventricular end-diastolic pressure.  Following the catheterization, he has felt well, but he continues to experience some shortness of breath with weakness and decreased energy.  He denies any chest tightness.  Recent laboratory done 10 days ago revealed an elevated potassium at 5.7.  On recheck 3 days later was 5.4.  Subsequent, he has been taken off ARB therapy.    In August 2018 he was complaining of significant fatigue.  His blood pressure was stable and his pulse was 55.  I recommended he reduce his atenolol from 12.5 mg twice a day to just 12.5 mg at bedtime.  I also reduced his isosorbide from 60 mg down to 30 mg in light of his recent catheterization findings.  His prior hypokalemia, resolved with discontinuance of ARB, but more importantly with discontinuance of his excessive exogenous potassium intake with certain foods.   When I saw him in October 2018 he was fairly  stable. Over the winter months he has had difficulty with his back and required injections.  This has limited his exercise.  He admits to gaining weight.  He has noticed that he is more short of breath with walking up hills.  Blood pressures at home often been in the 140-150 range.  He denies any chest pressure.  He is unaware of palpitations.  He had repeat lab work one week ago.  Renal function was stable with a creatinine 1.15.  Potassium was 4.7.  LFTs were normal.  Hemoglobin and hematocrit were stable.  Lipid studies were excellent.   When I saw him in April 2019 his blood pressure recordings at home were in the 130-150 range and he had experience some shortness of breath.  I suggested a trial of HCTZ 12.5 mg every other day.  He was tolerating livalo 4 mg, vascepa and Zetia for hyperlipidemia.  Over the past 6 months, he has been without recurrent anginal symptoms.  He continues to play golf at least 3 days/week and typically score is 75 or below.  He underwent repeat laboratory last week and lipid studies remain excellent with total cholesterol 124, LDL 57, triglycerides 82, HDL 51.  Shortness of breath has improved.  He denies any orthopnea.   When I saw him in October 2019 at which time he was feeling well and his shortness of breath had improved.  His blood pressure was excellent on a regimen consisting of amlodipine 10 mg, atenolol 12.5 mg at bedtime and he had not had any recent swelling.  He was no longer taking HCTZ.  He was playing golf 3 days/week.   He was evaluated in a telemedicine visit on Jun 17, 2018.  At that time he was remaining stable and denied any symptoms of chest pain PND orthopnea.  He is still playing golf at least 3 days/week and his highest score is 82 over the past year.  He does not routinely exercise on the days he does not play golf.  He states his blood pressures typically has been running in the 0000000 with diastolics in the 0000000 to [id number].  He denied any palpitations,  episodes of presyncope or syncope.   I saw him as an add-on in the office on December 21, 2018 after he had begun to notice more exertional dyspnea as well as upper back discomfort which in the past may have been his anginal equivalent.  With progressive symptoms repeat echocardiography and definitive cardiac catheterization was recommended.    An echo Doppler  study on December 30, 2018 showed an EF of 55 to 60%.  There was mild aortic sclerosis.  Diastolic dysfunction was indeterminate.  Previously had been noted to have grade 1 diastolic dysfunction on prior echocardiography.  He underwent successful catheterization on January 04, 2019 which showed widely patent stents in the LAD and circumflex vessel.  The LAD had 40% stenosis in a small diagonal branch arising just distal to the stent in the mid LAD.  There was mild 20% mid LAD stenosis.  The circumflex vessel had smooth 20% narrowing proximal to the proximal stent.  There was mild 40 and 30% narrowings and small marginal branches. The RCA had mild CAD of 20 and 30% with calcification and there was 70% stenosis in a small acute marginal branch ostially.  Medical therapy was recommended.  I saw him for initial follow-up of his cardiac catheterization on January 13, 2019 at which time he was doing well on medical therapy.  When I last saw him in April 2016 he remained stable.  He was playing golf 3 times per week and still scoring in the low to mid 70s. He continued to be on amlodipine 10 mg, isosorbide 60 mg a atenolol 12.5 mg twice a day in addition to spironolactone both for blood pressure and his CAD.  He continues to be on DAPT with aspirin/Plavix.  He tolerates Livalo in addition to [employer].    I saw him in October 2021 at which time he continued to remain stable.  He had undergone a CT angio of his chest with contrast by his pulmonologist which showed minimal bibasilar subsegmental atelectasis.  He was noted to have coronary artery calcification as  well as aortic atherosclerosis.  There was no evidence for PE.  He continues to play golf at least 3 days/week and his scores are consistently below 78.  He continues to be on DAPT but denies bleeding.  He is on Zetia 10 mg in addition to Livalo 4 mg and Vascepa 1 capsule twice a day.  Recent lipid studies showed a total cholesterol 152, HDL 52, LDL 78 and triglycerides 122 from November 23, 2019.  Renal function remained stable and he is continue to be on amlodipine 10 mg, isosorbide 60 mg, atenolol 12.5 mg twice a day and spironolactone 12.5 mg daily.  He has not had any anginal symptomatology or change in exercise capacity.    I saw him on Jun 20, 2020 and since his prior evaluation he developed some hoarseness to his voice in December.  He subsequently underwent ENT evaluation by Dr. Clyde Canterbury and was found to have a friable 4 to 5 mm lesion of the left mid to anterior vocal cord.  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  Today he completed his 33rd and final radiation treatment by Dr. Donella Stade.  At his evaluation he denied any chest pain or shortness of breath.  He has been somewhat more fatigued with the radiation.  He recently underwent laboratory on Jun 12, 2020.  LDL cholesterol was improved at 69, total cholesterol 138, triglycerides were minimally increased at 157.  Creatinine was 1.30 with an estimated GFR at 53.  LFTs were normal.  TSH and CBC were normal.    I last saw him on January 04, 2021 at which time he continued to do well.  He continued to have a raspy voice with some improvement.  He denies chest pain or shortness of breath.  He did experience some mild throat swelling with  radiation.  He does admit to some fatigue.  He continues to be on amlodipine 10 mg, atenolol 12.5 mg twice a day, and isosorbide 60 mg daily.  He was recently told to hold his spironolactone by his primary physician.  He continues to be on Livalo 4 mg, Zetia 10 mg, and Vascepa 1 g twice a day for mixed  hyperlipidemia.  Recent laboratory on December 20, 2020 showed an AST of 41 and ALT of 34.  Hemoglobin hematocrit were stable at 16.1/46.8.  TSH had increased to 6.28 from 3.94 in May 2022.  He completed radiation treatments with Dr. Donella Stade.  He was hospitalized on June 13 through June 15 with mild exertional shortness of breath and intermittent chest tightness.  High-sensitivity troponins were negative and he was chest pain free with increase of isosorbide to 60 mg twice a day and initiation of Ranexa 500 mg twice a day.  I last saw him on August 31, 2021. He felt well and ws without recurrent chest pain on his increase medical therapy.  He continues to be active and plays golf 3 days/week.  He continues to be on amlodipine 5 mg, atenolol 37.5 mg twice a day, is now on furosemide 20 mg 2 days/week, isosorbide 60 mg twice a day, and Ranexa 500 mg twice a day.  He continues to be on Livalo 4 mg and Zetia 10 mg for hyperlipidemia.  He is on finasteride for his prostate.   Since I last saw him he has felt well.  He has had issues with elevated blood pressure and was evaluated in the emergency room at [facility] on March 14, 2022.  Blood pressure was elevated upon arrival at 176/78.  Since that evaluation, his amlodipine was increased 5 up to 10 mg daily.  He has continued to be on a atenolol 12.5 mg twice a day, isosorbide which he takes 60 mg twice a day and has continued to take Ranexa 500 mg twice daily.  He has been taking furosemide 20 mg 2 times per week.  He has continued to be on DAPT with aspirin/Plavix.  He is on Zetia at 10 mg, needs reapproval of Vascepa which he was taking 1 capsule twice a day and is on Livalo 4 mg daily.  He presents for follow-up evaluation.  Past Medical History:  Diagnosis Date   BPH (benign prostatic hyperplasia)    Followed by [name]/urology   CAD S/P PTCA only of RPAV-PL 05/31/2015   99% --> 0%PAV - PTCA only (unable to advance STENT) due to RCA  calcification - prox & distal RCA 30%; Patent LAD stent & Cx stent (~20% ISR).    Calculus of kidney    "that's how they found the cancer" (04/27/2013)   Coronary artery disease involving native coronary artery of native heart with unstable angina pectoris ([place]) 12/03/2011   S/P Cardiac angioplasty 1986, 1991, 1993, 1997.  Last cardiac catheterization 2001.  Cardiolite 05/2011 low risk with normal EF 65%.  Followed by cardiology/Sawsan Riggio [facility] every six months.    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   Myocardial infarction [facility])    Other abnormal  blood chemistry    Other nonspecific abnormal serum enzyme levels    Personal history of colonic polyps    Pure hypercholesterolemia    Tobacco use disorder    Unspecified congenital cystic kidney disease    Unstable angina ([place]) 04/05/2013; 05/2015    Past Surgical History:  Procedure Laterality Date   APPENDECTOMY  2014   CARDIAC CATHETERIZATION N/A 05/30/2015   Procedure: Left Heart Cath and Coronary Angiography;  Surgeon: [name], MD;  Location: [place] [facility];  Service: Cardiovascular;  Laterality: N/A;   CARDIAC CATHETERIZATION N/A 05/30/2015   Procedure: Coronary Balloon Angioplasty;  Surgeon: [name], MD;  Location: [place] [facility];  Service: Cardiovascular;  Laterality: N/A;   Cardiolite  05/06/2011   low risk study; normal EF.  [facility]   carotid dopplers  03/08/2011   minimal plaque formation B. Symptoms: dizziness.   COLONOSCOPY  10/05/2008   single polyp, [facility].  [name].  Repeat in 3 years.   CORONARY ANGIOPLASTY     "I've had 4" (04/27/2013)   CORONARY ANGIOPLASTY WITH STENT PLACEMENT  04/2013; 04/27/2013   "2 + 1" (04/27/2013)   EYE  SURGERY  11/04/2013   Cataract surgery B; [facility].   LEFT HEART CATH AND CORONARY ANGIOGRAPHY N/A 08/05/2016   Procedure: Left Heart Cath and Coronary Angiography;  Surgeon: Martinique, Peter M, MD;  Location: [place] [facility];  Service: Cardiovascular;  Laterality: N/A;   LEFT HEART CATH AND CORONARY ANGIOGRAPHY N/A 01/04/2019   Procedure: LEFT HEART CATH AND CORONARY ANGIOGRAPHY;  Surgeon: [name], MD;  Location: [place] [facility];  Service: Cardiovascular;  Laterality: N/A;   MELANOMA EXCISION Left ~ 2007   "arm"   MELANOMA EXCISION  02/07/2015   R upper back. [facility]   MICROLARYNGOSCOPY N/A 04/06/2020   Procedure: MICROLARYNGOSCOPY WITH BIOPSY OF LARYNX;  Surgeon: Clyde Canterbury, MD;  Location: [facility] ORS;  Service: ENT;  Laterality: N/A;   NEPHRECTOMY Left 2006   PERCUTANEOUS CORONARY ROTOBLATOR INTERVENTION (PCI-R) N/A 04/06/2013   Procedure: PERCUTANEOUS CORONARY ROTOBLATOR INTERVENTION (PCI-R);  Surgeon: [name], MD;  Location[facility] CATH LAB;  Service: Cardiovascular;  Laterality: N/A;   PERCUTANEOUS CORONARY STENT INTERVENTION (PCI-S) N/A 04/27/2013   Procedure: PERCUTANEOUS CORONARY STENT INTERVENTION (PCI-S);  Surgeon: [name], MD;  Location: [facility] CATH LAB;  Service: Cardiovascular;  Laterality: N/A;    Allergies  Allergen Reactions   Angiotensin Receptor Blockers     Other reaction(s): Kidney Disorder Hyperkalemia   Lovastatin Other (See Comments)    Elevated liver enzymes   Morphine Hives   Nifedipine Other (See Comments)    Elevated liver enzymes    Current Outpatient Medications  Medication Sig Dispense Refill   acetaminophen (TYLENOL) 500 MG tablet Take 500 mg by mouth every 6 (six) hours as needed (for pain.).      amLODipine (NORVASC) 10 MG tablet TAKE 1 TABLET BY MOUTH IN THE  EVENING 90 tablet 3   aspirin 81 MG tablet Take 81 mg by mouth every evening.     clopidogrel (PLAVIX) 75 MG tablet TAKE 1 TABLET BY MOUTH  DAILY 90 tablet 3   ezetimibe (ZETIA) 10 MG  tablet TAKE 1 TABLET BY MOUTH DAILY 90 tablet 3   finasteride (PROSCAR) 5 MG tablet Take 1 tablet (5 mg total) by mouth daily. (Patient taking differently: Take 2.5 mg by mouth daily.) 90 tablet 3   furosemide (LASIX) 20 MG tablet TAKE 1 TABLET BY MOUTH 2  TIMES A WEEK. 26 tablet 3   ipratropium (ATROVENT) 0.03 % nasal spray Place 1 spray into both nostrils every 12 (twelve) hours. daily     isosorbide mononitrate (IMDUR) 60 MG 24 hr tablet TAKE 1 TABLET BY MOUTH EVERY  MORNING AND 1 TABLET BY MOUTH  EVERY NIGHT AT BEDTIME 180 tablet 3   Pitavastatin Calcium (LIVALO) 4 MG TABS Take 1 tablet (4 mg total) by mouth every evening. 90 tablet 3   ranolazine (RANEXA) 500 MG 12 hr tablet TAKE 1 TABLET BY MOUTH TWICE  DAILY 180 tablet 3   amLODipine (NORVASC) 5 MG tablet Take 1 tablet (5 mg total) by mouth every evening. (Patient not taking: Reported on 03/18/2022) 30 tablet 0   atenolol (TENORMIN) 25 MG tablet Take 0.5 tablets (12.5 mg total) by mouth daily. 45 tablet 3   icosapent Ethyl (VASCEPA) 1 g capsule Take 1 capsule (1 g total) by mouth 2 (two) times daily. 180 capsule 3   nitroGLYCERIN (NITROSTAT) 0.4 MG SL tablet Place 1 tablet (0.4 mg total) under the tongue every 5 (five) minutes as needed for chest pain. (Patient not taking: Reported on 03/18/2022) 25 tablet 11   No current facility-administered medications for this visit.    Socially he remains active. He is married has 5 children 10 grandchildren 2 great-grandchildren. Is no alcohol use. He typically scores below 75 and plays golf 3 days per week.  He typically scores in the 70s, better than his age.  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 Clyde Canterbury Endocrine: Negative; no heat/cold intolerance; no diabetes Neuro: Negative; no changes in balance, headaches Skin: Negative; No rashes or skin lesions Psychiatric: Negative; No behavioral problems, depression Sleep: Negative; No snoring, daytime sleepiness, hypersomnolence, bruxism, restless legs, hypnogognic hallucinations, no cataplexy Other comprehensive 14 point system review is negative.   PE Pulse (!) 55   Ht 5' 7.5" (1.715 m)   Wt 191 lb (86.6 kg)   SpO2 96%   BMI 29.47 kg/m    Orthostatic blood pressure by the nurse: supine was 155/68 with a pulse of 56; sitting 166/91 with a pulse of 60, and standing 160/99 with a pulse of 63.  Repeat supine blood pressure by me was 130/66.  Wt Readings from Last 3 Encounters:  03/18/22 191 lb (86.6 kg)  03/14/22 187 lb (84.8 kg)  08/31/21 187 lb 9.6 oz (85.1 kg)    General: Alert, oriented, no distress.  Skin: normal turgor, no rashes, warm and dry HEENT: Normocephalic, atraumatic. Pupils equal round and reactive to light; sclera anicteric; extraocular muscles intact;  Nose without nasal septal hypertrophy Mouth/Parynx benign; Mallinpatti scale 3 Neck: No JVD, no carotid bruits; normal carotid upstroke Lungs: clear to ausculatation and percussion; no wheezing or rales Chest wall: without tenderness to palpitation Heart: PMI not displaced, RRR, s1 s2 normal, 1/6 systolic murmur, no diastolic murmur, no rubs, gallops, thrills, or heaves Abdomen: soft, nontender; no hepatosplenomehaly, BS+; abdominal aorta nontender and not dilated by palpation. Back: no CVA tenderness Pulses 2+ Musculoskeletal: full range of motion, normal strength, no joint deformities Extremities: no clubbing cyanosis  or edema, Homan's sign negative  Neurologic: grossly  nonfocal; Cranial nerves grossly wnl Psychologic: Normal mood and affect    March 18, 2022 ECG (independently read by me): Sinus bradycardia at 55  I personally reviewed the ECG from 03/14/2022 which shows sinus bradycardia at 55  August 31, 2021 ECG (independently read by me):  Sinus rhythm at 63, PVCs  January 04, 2021 ECG (independently read by me): Sinu bradycardia at 58; NSSTT changes, no ectopy   Jun 20, 2020 ECG (independently read by me): Normal sinus rhythm at 62 bpm.  No ectopy.  Normal intervals    October 2021 ECG (independently read by me):  Sinus bradycardia at 58; no ectopy, normal intervals    May 21, 2019 ECG (independently read by me): Sinus bradycardia 54 bpm.  PR interval 188 ms, QTc interval 421 ms.  No ectopy.  January 13, 2019 ECG (independently read by me): Sinus bradycardia 54 bpm.  No ectopy.  PR interval 192 ms, QTc interval 4 3 ms  December 21, 2018 ECG (independently read by me): Normal sinus rhythm at 77 bpm.  Nonspecific ST changes.  Normal intervals  October 20119 ECG (independently read by me): Normal sinus rhythm at 61 bpm.  No ectopy.  Normal intervals.  April 2019 ECG (independently read by me):normal sinus rhythm at 61 bpm.  No ectopy.  No ST segment changes.  October 2018 ECG (independently read by me): Sinus bradycardia 57 bpm.  No ST segment changes.  Normal intervals.  August 2018 ECG (independently read by me): Sinus bradycardia 55 bpm.  Normal intervals.  No significant ST-T changes  June 2018 ECG (independently read by me): Sinus bradycardia 58 bpm.  Normal intervals.  No significant ST-T changes.  April 2018 ECG (independently read by me): Normal sinus rhythm at 63 bpm.  Normal intervals.  No ST segment changes.  September 2017 ECG (independently read by me): Normal sinus rhythm at 60 bpm.  Nonspecific T changes.  Intervals are normal.  May 2017 ECG (independently read by me): Sinus bradycardia 55 bpm.  No ectopy.  Normal intervals.   Nondiagnostic T changes in lead 3.  April 2017 ECG (independently read by me): Sinus bradycardia 55 bpm.  No ectopy.  No significant ST changes.  Normal intervals.  01/12/2015 ECG (independently read by me): Sinus bradycardia 54 bpm.  No ectopy.  Normal intervals.  June 2016 ECG (independently read by me): Sinus bradycardia 58 bpm.  Normal intervals.  No ectopy. Non-specific T change aVL  November 2015 ECG (independently read by me): Sinus bradycardia 54 bpm.  No significant ST-T changes.  Normal intervals.  May 2015 ECG (independently read by me): Sinus bradycardia 54 beats per minute.  No ectopy.  QTc interval 396 ms.  No significant ST changes.  04/22/2013 ECG (independently read by me): Sinus bradycardia 55 beats per minute. Nonspecific ST changes  03/01/2013 ECG (independently read by me): Sinus bradycardia 52 beats per minute. Normal intervals. No significant ST changes.  Prior ECG of 11/20/2012: Sinus rhythm at 51 beats per minute. QTc interval 400 ms. No significant ST changes.  LABS:     Latest Ref Rng & Units 03/14/2022    7:19 PM 03/14/2022    7:05 AM 08/09/2021    2:03 PM  BMP  Glucose 70 - 99 mg/dL 92  106  99   BUN 8 - 23 mg/dL 15  13  17   $ Creatinine 0.61 - 1.24 mg/dL 1.18  1.34  1.29   BUN/Creat  Ratio 10 - 24  10  13   $ Sodium 135 - 145 mmol/L 136  142  141   Potassium 3.5 - 5.1 mmol/L 3.8  5.1  4.9   Chloride 98 - 111 mmol/L 101  101  104   CO2 22 - 32 mmol/L 26  26  23   $ Calcium 8.9 - 10.3 mg/dL 9.1  9.7  9.6       Latest Ref Rng & Units 03/14/2022    7:05 AM 07/27/2021    9:12 AM 12/20/2020    7:06 AM  Hepatic Function  Total Protein 6.0 - 8.5 g/dL 7.2  7.6  6.9   Albumin 3.7 - 4.7 g/dL 4.5  4.6  4.3   AST 0 - 40 IU/L 31  38  41   ALT 0 - 44 IU/L 28  36  34   Alk Phosphatase 44 - 121 IU/L 62  62  65   Total Bilirubin 0.0 - 1.2 mg/dL 1.0  0.7  0.8       Latest Ref Rng & Units 03/14/2022    7:19 PM 03/14/2022    7:05 AM 07/27/2021    9:12 AM  CBC  WBC 4.0 -  10.5 K/uL 6.9  6.6  6.9   Hemoglobin 13.0 - 17.0 g/dL 16.6  16.2  17.1   Hematocrit 39.0 - 52.0 % 50.5  49.0  52.0   Platelets 150 - 400 K/uL 228  221  230    Lab Results  Component Value Date   MCV 89.2 03/14/2022   MCV 90 03/14/2022   MCV 89 07/27/2021   Lab Results  Component Value Date   TSH 7.800 (H) 03/14/2022  .  Lipid Panel     Component Value Date/Time   CHOL 147 03/14/2022 0705   TRIG 108 03/14/2022 0705   TRIG 131 02/22/2013 0804   HDL 54 03/14/2022 0705   HDL 40 02/22/2013 0804   CHOLHDL 2.7 03/14/2022 0705   CHOLHDL 3.5 05/09/2015 1400   VLDL 31 (H) 05/09/2015 1400   LDLCALC 73 03/14/2022 0705   LDLCALC 50 02/22/2013 0804   RADIOLOGY: Dg Chest 2 View  08/17/2012   *RADIOLOGY REPORT*  Clinical Data: Renal cell carcinoma  CHEST - 2 VIEW  Comparison: 05/08/11  Findings: The cardiomediastinal silhouette is stable.  No acute infiltrate or pleural effusion.  No pulmonary edema.  Bony thorax is unremarkable.  IMPRESSION: No active disease.  No significant change.   Original Report Authenticated By: Lahoma Crocker, M.D.    CV STUDIES:  Echo 12/30/2018 Left ventricular ejection fraction, by visual estimation, is 55 to 60%. The left ventricle has normal function. There is no left ventricular hypertrophy. 2. Left ventricular diastolic parameters are indeterminate. 3. The left ventricle has no regional wall motion abnormalities. 4. Global right ventricle has normal systolic function.The right ventricular size is normal. No increase in right ventricular wall thickness. 5. Left atrial size was mildly dilated. 6. Right atrial size was normal. 7. The mitral valve is normal in structure. No evidence of mitral valve regurgitation. No evidence of mitral stenosis. 8. The tricuspid valve is normal in structure. Tricuspid valve regurgitation is not demonstrated. 9. The aortic valve is normal in structure. Aortic valve regurgitation is trivial. Mild aortic valve sclerosis without  stenosis. 10. The pulmonic valve was grossly normal. Pulmonic valve regurgitation is not visualized. 11. The inferior vena cava is normal in size with greater than 50% respiratory variability, suggesting right atrial pressure of 3  mmHg.   CARDIAC CATHETERIZATION: 01/04/2019  Mid LM to Mid LAD lesion is 10% stenosed. 2nd Diag lesion is 40% stenosed. Mid LAD to Dist LAD lesion is 20% stenosed. Prox Cx to Mid Cx lesion is 5% stenosed. Ost Cx to Prox Cx lesion is 20% stenosed. 1st Mrg lesion is 40% stenosed. 2nd Mrg lesion is 30% stenosed. Ost RCA to Mid RCA lesion is 20% stenosed. Acute Mrg lesion is 70% stenosed. Dist RCA lesion is 30% stenosed. RPAV lesion is 20% stenosed. The left ventricular systolic function is normal. LV end diastolic pressure is normal. RPDA lesion is 40% stenosed.   Widely patent stents in the LAD and circumflex vessel.  The LAD has 40% stenosis in a small diagonal branch arising just distal to the stent in the mid LAD.  There is mild 20% mid LAD narrowing; complex vessel has smooth 20% narrowing proximal to the stented segment.  Small marginal branches arising within the circumflex stent had ostial narrowing of 40 and 30%; the  right coronary artery has moderate diffuse calcification with irregularity with narrowing of 20 to 30% in the mid vessel, 30% prior to PDA, and with no significant restenosis at the site of distal PTCA.  A small acute marginal branch has 70 to 80% ostial narrowing in a small caliber vessel.   LVEDP 17 - 23 MM hG.   RECOMMENDATION: The catheterization study is not significantly changed from the patient's last catheterization in July 2018.  Atherectomy sites with stenting in the LAD and circumflex vessel remain widely patent.  There is mild small branch ostial narrowings.  The RCA is calcified with patent PTCA site.  Medical therapy will be continued.  Continue long-term DAPT as the patient has been on aspirin/Plavix.  Continue aggressive  lipid-lowering therapy with target LDL less than 70.  Exercise prescription.        IMPRESSION:  1. Coronary artery disease involving native coronary artery of native heart with angina pectoris ([place])   2. CAD in native artery: Status post multiple PCIs   3. Hyperlipidemia with target LDL less than 70   4. Essential hypertension   5. Grade I diastolic dysfunction   6. Squamous cell carcinoma of vocal cord [facility])     ASSESSMENT AND PLAN: Nicholas Douglas is a young appearing 89 year old gentleman who has CAD dating back to 9 and had undergone multiple interventions over a10 year period from 15 to 1997. He developed unstable angina symptomatology in March 2015 and catheterization done initially at [facility] showed multivessel disease.  He underwent successful staged rotational coronary atherectomy initially involving the RCA in early March 2015, and on 04/27/2013 repeat intervention was done to the left circumflex system with rotational atherectomy.  At that time, he had a widely patent stent of his RCA, as well as a widely patent stent in his LAD.  He also had 60-70% mid LAD stenosis, which had not progressed. In 2017 he noticed a change in symptomatology with the development of more shortness of breath, and no energy and felt occasional episodes of vague chest discomfort with possible left arm radiation.  He was hospitalized in late April 2017 and was found to have progressive 99% distal RCA stenosis after the PDA takeoff.  His stents in the LAD and circumflex were widely patent.  There was mild concomitant CAD in the circumflex.  His RCA was diffusely diseased and calcified with narrowings of 30% of the mid segment, but there was an angulated area of narrowing calcified narrowing  of 40% before the acute margin, which prevented a stent from getting beyond this.  He underwent successful PTCA  to the distal stenosis.  At catheterization in July 2018 his intervention sites were patent and there was no  significant CAD progression. There was mild 30-35% narrowing in the circumflex and mild 40% narrowing in the RCA.  The stents were patent as was the prior PTCA site.  He has had issues with shortness of breath.  ARB therapy had to be discontinued secondary to hyperkalemia, but at the time he was also having a fair amount of exogenous potassium intake.  Because of progressive symptoms of increasing shortness of breath and upper back discomfort which he believed may have been his anginal equivalent definitive catheterization was  performed in November 2020.  The LAD and circumflex stents were patent his distal RCA PTCA site was without restenosis.  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 He was admitted to the hospital on June 13 and discharged on July 19, 2021 with increasing shortness of breath and some vague chest tightness.  An echo Doppler study from July 18, 2021 showed EF 55 to 60% with grade 1 diastolic dysfunction and mild LVH.  There was mild MR and aortic valve sclerosis.  His symptoms improved with diuresis and his isosorbide was titrated to 60 mg twice a day and Ranexa 500 mg was initiated.  Presently, he has been without anginal symptomatology.  However, he recently was evaluated [facility] in March 14, 2022 with significant blood pressure elevation.  At that time, amlodipine was increased to 10 mg daily.  He continues to be on isosorbide 60 mg twice a day, atenolol 12.5 mg twice a day in addition to Ranexa 500 mg twice a day.  His resting pulse is in the 50s.  At times he admits to some fatigue.  I have suggested he try decreasing atenolol to just 12.5 mg daily in the a.m.  He will continue his Ranexa.  He continues to be on Zetia and Livalo 4 mg daily for his statin therapy since he did not tolerate other statins.  Laboratory from March 14, 2022 showed total cholesterol 147, HDL 54, LDL 73, triglycerides 108.   We have reapproved his prescription for Vascepa.  He will continue taking the increased dose of amlodipine.  He continues to be on DAPT with aspirin/Plavix he sees Dr. Emily Filbert in [place] for primary care.  I will see him in 4 months for reevaluation.    [name], MD, [facility]  03/23/2022 3:22 PM

## 2022-03-18 NOTE — Patient Instructions (Addendum)
Medication Instructions:  Your physician has recommended you make the following change in your medication:  RESTART: Vascepa 1g Twice daily  DECREASE: Atenolol 12.45m daily  *If you need a refill on your cardiac medications before your next appointment, please call your pharmacy*   Lab Work: NONE If you have labs (blood work) drawn today and your tests are completely normal, you will receive your results only by: [facility](if you have MyChart) OR A paper copy in the mail If you have any lab test that is abnormal or we need to change your treatment, we will call you to review the results.   Testing/Procedures: NONE   Follow-Up: [facility] and your health needs are our priority.  As part of our continuing mission to provide you with exceptional heart care, we have created designated Provider Care Teams.  These Care Teams include your primary Cardiologist (physician) and Advanced Practice Providers (APPs -  Physician Assistants and Nurse Practitioners) who all work together to provide you with the care you need, when you need it.  We recommend signing up for the patient portal called "MyChart".  Sign up information is provided on this After Visit Summary.  MyChart is used to connect with patients for Virtual Visits (Telemedicine).  Patients are able to view lab/test results, encounter notes, upcoming appointments, etc.  Non-urgent messages can be sent to your provider as well.   To learn more about what you can do with MyChart, go to [email or web address]    Your next appointment:   4 month(s)  Provider:   TShelva Majestic MD

## 2022-03-23 ENCOUNTER — Encounter: Payer: Self-pay | Admitting: Cardiovascular Disease

## 2022-05-03 ENCOUNTER — Encounter: Payer: Self-pay | Admitting: *Deleted

## 2022-05-30 ENCOUNTER — Ambulatory Visit: Payer: Medicare Other | Admitting: Radiation Oncology

## 2022-06-06 ENCOUNTER — Encounter: Payer: Self-pay | Admitting: Radiation Oncology

## 2022-06-06 ENCOUNTER — Ambulatory Visit
Admission: RE | Admit: 2022-06-06 | Discharge: 2022-06-06 | Disposition: A | Discharge status: Home / Self Care | Payer: Medicare Other | Source: Ambulatory Visit | Attending: Radiation Oncology | Admitting: Radiation Oncology

## 2022-06-06 VITALS — BP 153/83 | HR 68 | Temp 98.2°F | Resp 16 | Wt 187.6 lb

## 2022-06-06 DIAGNOSIS — Z8521 Personal history of malignant neoplasm of larynx: Secondary | ICD-10-CM | POA: Diagnosis present

## 2022-06-06 DIAGNOSIS — Z923 Personal history of irradiation: Secondary | ICD-10-CM | POA: Insufficient documentation

## 2022-06-06 DIAGNOSIS — C329 Malignant neoplasm of larynx, unspecified: Secondary | ICD-10-CM

## 2022-06-06 NOTE — Progress Notes (Signed)
Radiation Oncology Follow up Note  Name: ARIV PENROD   Date:   06/06/2022 MRN:  [id number] DOB: 05-Oct-1931    This 89 y.o. 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  Seen today in routine follow-up he is doing well specifically denies any dysphagia head and neck pain.Marland Kitchen  He had a head CT scan back in February chart reviewed showing no acute intracranial process.  He has been seen by Dr. Willeen Cass on a regular basis undergoing upper endoscopy showing no evidence of disease.  COMPLICATIONS OF TREATMENT: none  FOLLOW UP COMPLIANCE: keeps appointments   PHYSICAL EXAM:  BP (!) 153/83 (BP Location: Left Arm, Patient Position: Sitting, Cuff Size: Normal)   Pulse 68   Temp 98.2 F (36.8 C) (Tympanic)   Resp 16   Wt 187 lb 9.6 oz (85.1 kg)   BMI 28.95 kg/m  No evidence of cervical or supraclavicular adenopathy.  Oral cavity is clear.  Well-developed well-nourished patient in NAD. HEENT reveals PERLA, EOMI, discs not visualized.  Oral cavity is clear. No oral mucosal lesions are identified. Neck is clear without evidence of cervical or supraclavicular adenopathy. Lungs are clear to A&P. Cardiac examination is essentially unremarkable with regular rate and rhythm without murmur rub or thrill. Abdomen is benign with no organomegaly or masses noted. Motor sensory and DTR levels are equal and symmetric in the upper and lower extremities. Cranial nerves II through XII are grossly intact. Proprioception is intact. No peripheral adenopathy or edema is identified. No motor or sensory levels are noted. Crude visual fields are within normal range.  RADIOLOGY RESULTS: CT scan of the head reviewed  PLAN: Present time  patient is now 2 years with no evidence of disease.  I am going to turn follow-up care over to ENT.  I be happy to reevaluate the patient in time the future should that be indicated.  Patient knows to call with any concerns.  I would like to take this opportunity to thank you for allowing me to participate in the care of your patient.Carmina Miller, MD

## 2022-08-06 ENCOUNTER — Emergency Department
Admission: EM | Admit: 2022-08-06 | Discharge: 2022-08-06 | Disposition: A | Discharge status: Home / Self Care | Payer: Medicare Other | Attending: Emergency Medicine | Admitting: Emergency Medicine

## 2022-08-06 ENCOUNTER — Other Ambulatory Visit: Payer: Self-pay

## 2022-08-06 ENCOUNTER — Emergency Department: Payer: Medicare Other

## 2022-08-06 DIAGNOSIS — N189 Chronic kidney disease, unspecified: Secondary | ICD-10-CM | POA: Insufficient documentation

## 2022-08-06 DIAGNOSIS — R0602 Shortness of breath: Secondary | ICD-10-CM | POA: Diagnosis present

## 2022-08-06 DIAGNOSIS — I130 Hypertensive heart and chronic kidney disease with heart failure and stage 1 through stage 4 chronic kidney disease, or unspecified chronic kidney disease: Secondary | ICD-10-CM | POA: Insufficient documentation

## 2022-08-06 DIAGNOSIS — I509 Heart failure, unspecified: Secondary | ICD-10-CM | POA: Diagnosis not present

## 2022-08-06 LAB — CBC
HCT: 50.5 % (ref 39.0–52.0)
Hemoglobin: 16.6 g/dL (ref 13.0–17.0)
MCH: 29.1 pg (ref 26.0–34.0)
MCHC: 32.9 g/dL (ref 30.0–36.0)
MCV: 88.6 fL (ref 80.0–100.0)
Platelets: 280 10*3/uL (ref 150–400)
RBC: 5.7 MIL/uL (ref 4.22–5.81)
RDW: 13 % (ref 11.5–15.5)
WBC: 7.8 10*3/uL (ref 4.0–10.5)
nRBC: 0 % (ref 0.0–0.2)

## 2022-08-06 LAB — BASIC METABOLIC PANEL
Anion gap: 9 (ref 5–15)
BUN: 17 mg/dL (ref 8–23)
CO2: 29 mmol/L (ref 22–32)
Calcium: 9.3 mg/dL (ref 8.9–10.3)
Chloride: 98 mmol/L (ref 98–111)
Creatinine, Ser: 1.3 mg/dL — ABNORMAL HIGH (ref 0.61–1.24)
GFR, Estimated: 53 mL/min — ABNORMAL LOW (ref >60)
Glucose, Bld: 87 mg/dL (ref 70–99)
Potassium: 3.5 mmol/L (ref 3.5–5.1)
Sodium: 136 mmol/L (ref 135–145)

## 2022-08-06 LAB — TROPONIN I (HIGH SENSITIVITY)
Troponin I (High Sensitivity): 7 ng/L (ref <18)
Troponin I (High Sensitivity): 7 ng/L (ref <18)

## 2022-08-06 LAB — PROTIME-INR
INR: 1 (ref 0.8–1.2)
Prothrombin Time: 13.5 seconds (ref 11.4–15.2)

## 2022-08-06 LAB — TSH: TSH: 6.284 u[IU]/mL — ABNORMAL HIGH (ref 0.350–4.500)

## 2022-08-06 MED ORDER — FUROSEMIDE 10 MG/ML IJ SOLN
20.0000 mg | Freq: Once | INTRAMUSCULAR | Status: AC
  Administered 2022-08-06: 20 mg via INTRAVENOUS
  Filled 2022-08-06: qty 4

## 2022-08-06 NOTE — ED Notes (Signed)
Patient complains of being sob more than normal. His O2 sensor at home keeps reading 90%. Patient states he is NOT on any oxygen at home at this time.

## 2022-08-06 NOTE — ED Provider Notes (Signed)
[facility] Provider Note    Event Date/Time   First MD Initiated Contact with Patient 08/06/22 1706     (approximate)   History   Chief Complaint: Shortness of Breath   HPI  DAVARIOUS BRAZ is a 89 y.o. male with a history of hypertension, CKD, solitary kidney who complains of increased shortness of breath and fatigue over the last few days.  Has gained 3 pounds since yesterday.  Reports his oxygen saturation was 90% at home and normally its about 93%.  Denies orthopnea or chest pain.  He reports that he played a round of golf 3 days ago after which he felt exhausted.  He felt dehydrated and tried to drink fluids to rehydrate.  He took Lasix yesterday and the day before.     Physical Exam   Triage Vital Signs: ED Triage Vitals  Enc Vitals Group     BP 08/06/22 1549 (!) 158/93     Pulse Rate 08/06/22 1549 71     Resp 08/06/22 1549 18     Temp 08/06/22 1549 98.1 F (36.7 C)     Temp Source 08/06/22 1549 Oral     SpO2 08/06/22 1549 91 %     Weight 08/06/22 1555 184 lb (83.5 kg)     Height 08/06/22 1555 5\' 7"  (1.702 m)     Head Circumference --      Peak Flow --      Pain Score --      Pain Loc --      Pain Edu? --      Excl. in GC? --     Most recent vital signs: Vitals:   08/06/22 1923 08/06/22 2031  BP: (!) 159/76 (!) 167/86  Pulse: 67 63  Resp: 18 20  Temp:  98.3 F (36.8 C)  SpO2: 93% 94%    General: Awake, no distress.  CV:  Good peripheral perfusion.  Regular rate and rhythm Resp:  Normal effort.  Bilateral basilar crackles.  Symmetric air movement. Abd:  No distention.  Soft nontender Other:  No lower extremity edema, no calf tenderness   ED Results / Procedures / Treatments   Labs (all labs ordered are listed, but only abnormal results are displayed) Labs Reviewed  BASIC METABOLIC PANEL - Abnormal; Notable for the following components:      Result Value   Creatinine, Ser 1.30 (*)    GFR, Estimated 53 (*)    All other  components within normal limits  TSH - Abnormal; Notable for the following components:   TSH 6.284 (*)    All other components within normal limits  CBC  PROTIME-INR  TROPONIN I (HIGH SENSITIVITY)  TROPONIN I (HIGH SENSITIVITY)     EKG Interpreted by me Sinus rhythm rate of 81.  Normal axis intervals QRS ST segments and T waves   RADIOLOGY Chest x-ray interpreted by me, appears normal.  Radiology report reviewed   PROCEDURES:  Procedures   MEDICATIONS ORDERED IN ED: Medications  furosemide (LASIX) injection 20 mg (20 mg Intravenous Given 08/06/22 1824)     IMPRESSION / MDM / ASSESSMENT AND PLAN / ED COURSE  I reviewed the triage vital signs and the nursing notes.  DDx: Pulmonary edema, pleural effusion, pneumonia, NSTEMI, AKI, electrolyte abnormality, hypothyroidism  Patient's presentation is most consistent with acute presentation with potential threat to life or bodily function.  Patient presents with fatigue and shortness of breath.  Vital signs are normal except for borderline oxygen  saturation.  No chest pain.  No evidence of DVT, doubt PE.  Lung auscultation reveals crackles, suspect a degree of CHF symptoms.  Initial labs are at baseline.  Will give IV Lasix and reassess.   ----------------------------------------- 9:38 PM on 08/06/2022 ----------------------------------------- Patient has had urine output of about 450 mL after IV Lasix in the ED.  He feels much better.  Symptoms resolved.  Repeat lung auscultation is improved.  Stable for discharge.  He has an appointment with his doctor for tomorrow afternoon which I encouraged him to keep.  Return precautions discussed.      FINAL CLINICAL IMPRESSION(S) / ED DIAGNOSES   Final diagnoses:  Acute on chronic congestive heart failure, unspecified heart failure type (HCC)     Rx / DC Orders   ED Discharge Orders     None        Note:  This document was prepared using Dragon voice recognition  software and may include unintentional dictation errors.   Sharman Cheek, MD 08/06/22 2138

## 2022-08-06 NOTE — ED Triage Notes (Signed)
Patient complains of being sob more than normal. His O2 sensor at home keeps reading 90%. Patient states he is NOT on any oxygen at home at this time.  

## 2022-08-07 ENCOUNTER — Telehealth: Payer: Self-pay | Admitting: Cardiovascular Disease

## 2022-08-07 NOTE — Telephone Encounter (Signed)
Wife wants to know if patient will need to have lab work done prior to his visit on 7/23.  Wife stated they will have labs done in [place] at [facility].

## 2022-08-07 NOTE — Telephone Encounter (Signed)
Patient had multiple labs in hospital.  No Lipid.  Please advise if labs needed for upcoming appt.

## 2022-08-09 NOTE — Telephone Encounter (Signed)
Labs done on 08/06/22, with lipid in February; no need for new labs

## 2022-08-09 NOTE — Telephone Encounter (Signed)
Call to wife.  Rings then answered and hangs up. Called back and gave information. She states understanding.

## 2022-08-25 ENCOUNTER — Encounter: Payer: Self-pay | Admitting: Internal Medicine

## 2022-08-27 ENCOUNTER — Ambulatory Visit: Payer: Medicare Other | Attending: Cardiovascular Disease | Admitting: Cardiovascular Disease

## 2022-08-27 ENCOUNTER — Encounter: Payer: Self-pay | Admitting: Cardiovascular Disease

## 2022-08-27 VITALS — BP 145/83 | HR 69 | Ht 67.0 in | Wt 186.8 lb

## 2022-08-27 DIAGNOSIS — I2510 Atherosclerotic heart disease of native coronary artery without angina pectoris: Secondary | ICD-10-CM

## 2022-08-27 DIAGNOSIS — E785 Hyperlipidemia, unspecified: Secondary | ICD-10-CM

## 2022-08-27 DIAGNOSIS — I25119 Atherosclerotic heart disease of native coronary artery with unspecified angina pectoris: Secondary | ICD-10-CM

## 2022-08-27 DIAGNOSIS — I10 Essential (primary) hypertension: Secondary | ICD-10-CM

## 2022-08-27 DIAGNOSIS — E782 Mixed hyperlipidemia: Secondary | ICD-10-CM

## 2022-08-27 NOTE — Patient Instructions (Signed)
Medication Instructions:  TAKE LASIX 3 TIMES A WEEK AS NEEDED *If you need a refill on your cardiac medications before your next appointment, please call your pharmacy*   Lab Work: RETURN FOR FASTING LABS BEFORE YOUR NEXT APPOINTMENT. YOU DO NOT NEED AN APPOINTMENT FOR LABS. YOU WILL RECEIVE A LETTER OF REMINDER. If you have labs (blood work) drawn today and your tests are completely normal, you will receive your results only by: MyChart Message (if you have MyChart) OR A paper copy in the mail If you have any lab test that is abnormal or we need to change your treatment, we will call you to review the results.   Testing/Procedures: NONE   Follow-Up: At [facility], you and your health needs are our priority.  As part of our continuing mission to provide you with exceptional heart care, we have created designated Provider Care Teams.  These Care Teams include your primary Cardiologist (physician) and Advanced Practice Providers (APPs -  Physician Assistants and Nurse Practitioners) who all work together to provide you with the care you need, when you need it.  We recommend signing up for the patient portal called "MyChart".  Sign up information is provided on this After Visit Summary.  MyChart is used to connect with patients for Virtual Visits (Telemedicine).  Patients are able to view lab/test results, encounter notes, upcoming appointments, etc.  Non-urgent messages can be sent to your provider as well.   To learn more about what you can do with MyChart, go to [email or web address].    Your next appointment:   5 month(s)  Provider:   Nicki Gu[place], MD

## 2022-08-27 NOTE — Progress Notes (Signed)
Patient ID: Nicholas Douglas, male   DOB: Jan 11, 1932, 89 y.o.   MRN: [id number]      HPI: Nicholas Douglas is an 89 y.o. Caucasian male who presents to the office today for a 5 month cardiology evaluation.  Nicholas Douglas has known CAD and underwent numerous interventions dating back to 32, 1991, 1993, and in 1997. Cardiac catheterization in 2011 which showed preserved LV function with mild residual distal inferior apical hypocontractility. There is evidence for coronary calcification with segmental narrowing of his LAD of 30-40% proximally, 50% diffusely, in the midsegment, and 70-80% in the distal region, he had AV groove circumflex stenoses of 70 and 50% with a 40% OM 2 stenosis, and a 30-40 and 50% RCA stenoses with 80-90% stenosis in the acute marginal branch. He had been on medical therapy.  He presented to [facility] in March 2015 with class IV angina and catheterization demonstrated severe 2 vessel CAD with significant current calcification of the LAD with up to 95% mid LAD stenosis and diffuse proximal stenosis. He underwent successful high-speed rotational atherectomy the following day by Dr. Swaziland and a 1.5 mm bur was used. Mid LAD was stented with a 2.75x38 mm Promus stent in the proximal LAD was stented with a 3.0x28 mm Promus stent. Medical therapy was recommended for his distal LAD stenosis. He has only one kidney and staged intervention to the left circumflex coronary artery was recommended.  He underwent staged left circumflex PCI on 04/27/2013 by me with successful high-speed rotational atherectomy with a 1.5 and 1.75 mm burr, and ultimately had a 2.5x28 mm Promus premier DES stent inserted into the circumflex vessel, which was post dilated to approximately 2.6 mm.  Subsequently he has felt significantly improved with resolution of any chest pain  He has a history of significant hyperlipidemia and has had significant increased number of small LDL particles and insulin resistance. This seemed  to markedly improve with the addition of Niaspan added to zetia and lovaza.  Remotely, he had been on statins consisting of Lipitor and subsequently Crestor. He did have transient LFT elevation. He also concerns of possible risk of developing dementia with statin therapy.  He had  some episodes of Niaspan induced diffuse flushing. He wanted to stop taking his Niaspan. He did have subsequent NMR off Niaspan and done just on Zetia and as well as 2 g of Lovaza.  This showed increased abnormalities such that his total cholesterol was 201, LDL had risen to 124, but he now had LDL small particles which have increased from 685 to 1348. Laboratory on 10/26/2012: LDL particle number was elevated at 1657, LDL cholesterol 112 triglycerides 155 and total cholesterol 190. He continued to be insulin resistant with insulin resistance scored 65. When I last saw him, we elected to try Livalo and ultimately titrated this to 4 mg daily to take in addition to his Zetia 10 mg. Laboratory on 02/22/2013: LDL particle number was  markedly improved at 774 with an HDL of 40 calculated LDL 50 small LDL particle #782. Insulin resistance score was also improved at 51.  In July 2015 he developed abdominal discomfort and was found to have acute appendicitis. On CT imaging he was also noted to have prostate enlargement with nodularity and thickening of the bladder base and cystoscopy was suggested for further evaluation. He is status post left nephrectomy. He also was noted to have a 2.3 cm infrarenal suprailiac abdominal aortic aneurysm.  He has been on Livalo 2 mg, Zetia  10 mg for hyperlipidemia.  Lab work on 12/22/2013 showed a cholesterol 155, triglycerides 130, HDL 45, LDL 84.  He has been on amlodipine 10 mg atenolol 12.5 mg twice a day and valsartan 80 mg daily for blood pressure.  He continues to be on aspirin and Plavix for dual antiplatelet therapy.  A nuclear perfusion study on 06/30/2014  revealed normal perfusion and function with  an ejection fraction of 57%.    When he underwent a CT scan for his appendicitis he was told of having a small abdominal aortic aneurysm.  His mother also had an abdominal aortic aneurysm.  The patient recently sprained his right ankle.  As result, he has not been able to be as active as he had in the past and has not been able to play golf which typically he had played up to 4 times per week, often scoring in the 70s.    He was admitted to the hospital in April 2017 with complaints of increasing episodes of chest discomfort.  Troponins were mildly positive at 0.12, consistent with a non-STEMI.  I performed cardiac catheterization on 05/28/2015.  This showed widely patent stents in the LAD and circumflex vessels.  The RCA was diffusely diseased with calcification in the proximal to mid region with distal 99% stenosis and a noncalcified distal RCA segment immediately after the PDA takeoff.  ECI was difficult due to the calcified RCA preventing placement of a stent since the stent was never able to be passed beyond this calcified angled segment despite even attempting guide liner support.  Ultimately, he underwent successful PTCA of the 99% stenosis, which was reduced to 0%.  Since his intervention he had noticed dramatic improvement in his previous symptomatology.  He continues to feel well and is playing golf 3 times per week. He had a basal cell cancer removed from his nose.   When I saw him in April 2018 he was without chest pain or significant shortness of breath and was playing golf at least 3 days per week.  Laboratory showed a total cholesterol 147, triglycerides 151, HDL 43, LDL 74 on regimen of Zetia 10 mg and Livalo 2 mg.  In the past he's been intolerant to Crestor, Lipitor, and Zocor.  I further titrated Livalo 24 mg daily which  he has tolerated. An abdominal ultrasound showed aortoiliac atherosclerosis without aneurysm.   When I saw him in June 2018 he had begun to notice increasing shortness  of breath with walking. He denies any of the chest tightness or pressure that he had experienced prior to his interventions.  At his last catheterization, he had extensive calcification in his RCA and PTCA alone was done since the stent was unable to reach the subtotal distal RCA.  He denies recurrent chest tightness.  He was questioning whether or not he could have had  pneumonia comtributing to his shortness of breath.  At that evaluation, I further titrated his Imdur to 60 mg daily.  His blood pressure was also elevated and I titrated his ARB therapy.  He sagittally saw Nicholas Douglas as an add-on in with complaints of increasing symptomatology definitive cardiac catheterization was recommended.  He underwent repeat cardiac catheterization on 08/05/2016 by Dr. Peter Swaziland.  This continues to show patency of the stents in the proximal to mid LAD and patency of the stented the mid circumflex as well as patent PTCA site of the PL OM vessel.  There was no new disease to explain his symptoms.  He  had normal LV function and normal left ventricular end-diastolic pressure.  Following the catheterization, he has felt well, but he continues to experience some shortness of breath with weakness and decreased energy.  He denies any chest tightness.  Recent laboratory done 10 days ago revealed an elevated potassium at 5.7.  On recheck 3 days later was 5.4.  Subsequent, he has been taken off ARB therapy.    In August 2018 he was complaining of significant fatigue.  His blood pressure was stable and his pulse was 55.  I recommended he reduce his atenolol from 12.5 mg twice a day to just 12.5 mg at bedtime.  I also reduced his isosorbide from 60 mg down to 30 mg in light of his recent catheterization findings.  His prior hypokalemia, resolved with discontinuance of ARB, but more importantly with discontinuance of his excessive exogenous potassium intake with certain foods.   When I saw him in October 2018 he was fairly  stable. Over the winter months he has had difficulty with his back and required injections.  This has limited his exercise.  He admits to gaining weight.  He has noticed that he is more short of breath with walking up hills.  Blood pressures at home often been in the 140-150 range.  He denies any chest pressure.  He is unaware of palpitations.  He had repeat lab work one week ago.  Renal function was stable with a creatinine 1.15.  Potassium was 4.7.  LFTs were normal.  Hemoglobin and hematocrit were stable.  Lipid studies were excellent.   When I saw him in April 2019 his blood pressure recordings at home were in the 130-150 range and he had experience some shortness of breath.  I suggested a trial of HCTZ 12.5 mg every other day.  He was tolerating livalo 4 mg, vascepa and Zetia for hyperlipidemia.  Over the past 6 months, he has been without recurrent anginal symptoms.  He continues to play golf at least 3 days/week and typically score is 75 or below.  He underwent repeat laboratory last week and lipid studies remain excellent with total cholesterol 124, LDL 57, triglycerides 82, HDL 51.  Shortness of breath has improved.  He denies any orthopnea.   When I saw him in October 2019 at which time he was feeling well and his shortness of breath had improved.  His blood pressure was excellent on a regimen consisting of amlodipine 10 mg, atenolol 12.5 mg at bedtime and he had not had any recent swelling.  He was no longer taking HCTZ.  He was playing golf 3 days/week.   He was evaluated in a telemedicine visit on Jun 17, 2018.  At that time he was remaining stable and denied any symptoms of chest pain PND orthopnea.  He is still playing golf at least 3 days/week and his highest score is 82 over the past year.  He does not routinely exercise on the days he does not play golf.  He states his blood pressures typically has been running in the 140s with diastolics in the 60s to 70s.  He denied any palpitations,  episodes of presyncope or syncope.   I saw him as an add-on in the office on December 21, 2018 after he had begun to notice more exertional dyspnea as well as upper back discomfort which in the past may have been his anginal equivalent.  With progressive symptoms repeat echocardiography and definitive cardiac catheterization was recommended.    An echo Doppler  study on December 30, 2018 showed an EF of 55 to 60%.  There was mild aortic sclerosis.  Diastolic dysfunction was indeterminate.  Previously had been noted to have grade 1 diastolic dysfunction on prior echocardiography.  He underwent successful catheterization on January 04, 2019 which showed widely patent stents in the LAD and circumflex vessel.  The LAD had 40% stenosis in a small diagonal branch arising just distal to the stent in the mid LAD.  There was mild 20% mid LAD stenosis.  The circumflex vessel had smooth 20% narrowing proximal to the proximal stent.  There was mild 40 and 30% narrowings and small marginal branches. The RCA had mild CAD of 20 and 30% with calcification and there was 70% stenosis in a small acute marginal branch ostially.  Medical therapy was recommended.  I saw him for initial follow-up of his cardiac catheterization on January 13, 2019 at which time he was doing well on medical therapy.  When I last saw him in April 2016 he remained stable.  He was playing golf 3 times per week and still scoring in the low to mid 70s. He continued to be on amlodipine 10 mg, isosorbide 60 mg a atenolol 12.5 mg twice a day in addition to spironolactone both for blood pressure and his CAD.  He continues to be on DAPT with aspirin/Plavix.  He tolerates Livalo in addition to Nicholas Douglas.    I saw him in October 2021 at which time he continued to remain stable.  He had undergone a CT angio of his chest with contrast by his pulmonologist which showed minimal bibasilar subsegmental atelectasis.  He was noted to have coronary artery calcification as  well as aortic atherosclerosis.  There was no evidence for PE.  He continues to play golf at least 3 days/week and his scores are consistently below 78.  He continues to be on DAPT but denies bleeding.  He is on Zetia 10 mg in addition to Livalo 4 mg and Vascepa 1 capsule twice a day.  Recent lipid studies showed a total cholesterol 152, HDL 52, LDL 78 and triglycerides 122 from November 23, 2019.  Renal function remained stable and he is continue to be on amlodipine 10 mg, isosorbide 60 mg, atenolol 12.5 mg twice a day and spironolactone 12.5 mg daily.  He has not had any anginal symptomatology or change in exercise capacity.    I saw him on Jun 20, 2020 and since his prior evaluation he developed some hoarseness to his voice in December.  He subsequently underwent ENT evaluation by Dr. Geanie Logan and was found to have a friable 4 to 5 mm lesion of the left mid to anterior vocal cord.  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  Today he completed his 33rd and final radiation treatment by Dr. Aggie Cosier.  At his evaluation he denied any chest pain or shortness of breath.  He has been somewhat more fatigued with the radiation.  He recently underwent laboratory on Jun 12, 2020.  LDL cholesterol was improved at 69, total cholesterol 138, triglycerides were minimally increased at 157.  Creatinine was 1.30 with an estimated GFR at 53.  LFTs were normal.  TSH and CBC were normal.    I saw him on January 04, 2021 at which time he continued to do well.  He continued to have a raspy voice with some improvement.  He denies chest pain or shortness of breath.  He did experience some mild throat swelling with radiation.  He does admit to some fatigue.  He continues to be on amlodipine 10 mg, atenolol 12.5 mg twice a day, and isosorbide 60 mg daily.  He was recently told to hold his spironolactone by his primary physician.  He continues to be on Livalo 4 mg, Zetia 10 mg, and Vascepa 1 g twice a day for mixed  hyperlipidemia.  Recent laboratory on December 20, 2020 showed an AST of 41 and ALT of 34.  Hemoglobin hematocrit were stable at 16.1/46.8.  TSH had increased to 6.28 from 3.94 in May 2022.  He completed radiation treatments with Dr. Aggie Cosier.  He was hospitalized on June 13 through June 15 with mild exertional shortness of breath and intermittent chest tightness.  High-sensitivity troponins were negative and he was chest pain free with increase of isosorbide to 60 mg twice a day and initiation of Ranexa 500 mg twice a day.  I saw him on August 31, 2021. He felt well and ws without recurrent chest pain on his increase medical therapy.  He continues to be active and plays golf 3 days/week.  He continues to be on amlodipine 5 mg, atenolol 37.5 mg twice a day, is now on furosemide 20 mg 2 days/week, isosorbide 60 mg twice a day, and Ranexa 500 mg twice a day.  He continues to be on Livalo 4 mg and Zetia 10 mg for hyperlipidemia.  He is on finasteride for his prostate.   I last saw him on March 18, 2022.  He has had issues with elevated blood pressure and was evaluated in the emergency room at [facility] on March 14, 2022.  Blood pressure was elevated upon arrival at 176/78.  Since that evaluation, his amlodipine was increased 5 up to 10 mg daily.  He has continued to be on a atenolol 12.5 mg twice a day, isosorbide which he takes 60 mg twice a day and has continued to take Ranexa 500 mg twice daily.  He has been taking furosemide 20 mg 2 times per week.  He has continued to be on DAPT with aspirin/Plavix.  He is on Zetia at 10 mg, needs reapproval of Vascepa which he was taking 1 capsule twice a day and is on Livalo 4 mg daily.    Since I last saw him he was evaluated in [place] ER in August 06, 2022.  He had complaints of increasing shortness of breath and fatigue over the last several days.  He had played a round of golf 3 days ago and felt exhausted and dehydrated and tried to drink fluids for  rehydration.  Upon presentation to the ER blood pressure was elevated at 167/86.  O2 saturation was 93 and 94%.  He was given IV Lasix in the ER with excellent urine output of 450 mL.  He underwent an echo Doppler study on August 19, 2022 interpreted by [facility].  LV function was normal with EF at greater than 55%.  He had mildly enlarged left atrium.  He had mild aortic regurgitation without stenosis.  There was trivial MR and no TR.  Presently, Nicholas Douglas feels improved.  He has seen Dr. Hyacinth Meeker who is his primary care in [place].  He had increased Lasix dose, but now is back to his Lasix 20 mg 2 times per week.  He continues to be on isosorbide 60 mg, ranolazine 500 mg twice a day, atenolol 12.5 mg daily in addition to amlodipine 10 mg.  He presents for evaluation.  Past Medical History:  Diagnosis Date   BPH (benign prostatic hyperplasia)    Followed by [name]/urology   CAD S/P PTCA only of RPAV-PL 05/31/2015   99% --> 0%PAV - PTCA only (unable to advance STENT) due to RCA calcification - prox & distal RCA 30%; Patent LAD stent & Cx stent (~20% ISR).    Calculus of kidney    "that's how they found the cancer" (04/27/2013)   Coronary artery disease involving native coronary artery of native heart with unstable angina pectoris (HCC) 12/03/2011   S/P Cardiac angioplasty 1986, 1991, 1993, 1997.  Last cardiac catheterization 2001.  Cardiolite 05/2011 low risk with normal EF 65%.  Followed by cardiology/[facility] every six months.    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   Myocardial infarction [facility])    Other abnormal blood chemistry    Other nonspecific abnormal serum enzyme levels    Personal history  of colonic polyps    Pure hypercholesterolemia    Tobacco use disorder    Unspecified congenital cystic kidney disease    Unstable angina (HCC) 04/05/2013; 05/2015    Past Surgical History:  Procedure Laterality Date   APPENDECTOMY  2014   CARDIAC CATHETERIZATION N/A 05/30/2015   Procedure: Left Heart Cath and Coronary Angiography;  Surgeon: Lennette Bihari, MD;  Location: [facility] INVASIVE CV LAB;  Service: Cardiovascular;  Laterality: N/A;   CARDIAC CATHETERIZATION N/A 05/30/2015   Procedure: Coronary Balloon Angioplasty;  Surgeon: Lennette Bihari, MD;  Location: [facility] INVASIVE CV LAB;  Service: Cardiovascular;  Laterality: N/A;   Cardiolite  05/06/2011   low risk study; normal EF.  [facility]   carotid dopplers  03/08/2011   minimal plaque formation B. Symptoms: dizziness.   COLONOSCOPY  10/05/2008   single polyp, [facility].  [name].  Repeat in 3 years.   CORONARY ANGIOPLASTY     "I've had 4" (04/27/2013)   CORONARY ANGIOPLASTY WITH STENT PLACEMENT  04/2013; 04/27/2013   "2 + 1" (04/27/2013)   EYE SURGERY  11/04/2013   Cataract surgery B; [facility].   LEFT HEART CATH AND CORONARY ANGIOGRAPHY N/A 08/05/2016   Procedure: Left Heart Cath and Coronary Angiography;  Surgeon: Swaziland, Peter M, MD;  Location: [facility] INVASIVE CV LAB;  Service: Cardiovascular;  Laterality: N/A;   LEFT HEART CATH AND CORONARY ANGIOGRAPHY N/A 01/04/2019   Procedure: LEFT HEART CATH AND CORONARY ANGIOGRAPHY;  Surgeon: Lennette Bihari, MD;  Location: [facility] INVASIVE CV LAB;  Service: Cardiovascular;  Laterality: N/A;   MELANOMA EXCISION Left ~ 2007   "arm"   MELANOMA EXCISION  02/07/2015   R upper back. [facility]   MICROLARYNGOSCOPY N/A 04/06/2020   Procedure: MICROLARYNGOSCOPY WITH BIOPSY OF LARYNX;  Surgeon: Geanie Logan, MD;  Location: [facility] ORS;  Service: ENT;  Laterality: N/A;   NEPHRECTOMY Left 2006   PERCUTANEOUS CORONARY ROTOBLATOR INTERVENTION (PCI-R) N/A 04/06/2013   Procedure: PERCUTANEOUS CORONARY ROTOBLATOR INTERVENTION (PCI-R);  Surgeon: Peter M  Swaziland, MD;  Location: [facility] CATH LAB;  Service: Cardiovascular;  Laterality: N/A;   PERCUTANEOUS CORONARY STENT INTERVENTION (PCI-S) N/A 04/27/2013   Procedure: PERCUTANEOUS CORONARY STENT INTERVENTION (PCI-S);  Surgeon: Lennette Bihari, MD;  Location: [facility]  CATH LAB;  Service: Cardiovascular;  Laterality: N/A;    Allergies  Allergen Reactions   Angiotensin Receptor Blockers     Other reaction(s): Kidney Disorder Hyperkalemia   Lovastatin Other (See Comments)    Elevated liver enzymes   Morphine Hives   Nifedipine Other (See Comments)    Elevated liver enzymes    Current Outpatient Medications  Medication Sig Dispense Refill   acetaminophen (TYLENOL) 500 MG tablet Take 500 mg by mouth every 6 (six) hours as needed (for pain.).      amLODipine (NORVASC) 10 MG tablet TAKE 1 TABLET BY MOUTH IN THE  EVENING 90 tablet 3   aspirin 81 MG tablet Take 81 mg by mouth every evening.     atenolol (TENORMIN) 25 MG tablet Take 0.5 tablets (12.5 mg total) by mouth daily. 45 tablet 3   clopidogrel (PLAVIX) 75 MG tablet TAKE 1 TABLET BY MOUTH  DAILY 90 tablet 3   ezetimibe (ZETIA) 10 MG tablet TAKE 1 TABLET BY MOUTH DAILY 90 tablet 3   finasteride (PROSCAR) 5 MG tablet Take 1 tablet (5 mg total) by mouth daily. (Patient taking differently: Take 2.5 mg by mouth daily.) 90 tablet 3   furosemide (LASIX) 20 MG tablet TAKE 1 TABLET BY MOUTH 2 TIMES A WEEK. 26 tablet 3   icosapent Ethyl (VASCEPA) 1 g capsule Take 1 capsule (1 g total) by mouth 2 (two) times daily. 180 capsule 3   ipratropium (ATROVENT) 0.03 % nasal spray Place 1 spray into both nostrils every 12 (twelve) hours. daily     isosorbide mononitrate (IMDUR) 60 MG 24 hr tablet TAKE 1 TABLET BY MOUTH EVERY  MORNING AND 1 TABLET BY MOUTH  EVERY NIGHT AT BEDTIME 180 tablet 3   Pitavastatin Calcium (LIVALO) 4 MG TABS Take 1 tablet (4 mg total) by mouth every evening. 90 tablet 3   ranolazine (RANEXA) 500 MG 12 hr tablet TAKE 1 TABLET BY MOUTH TWICE  DAILY 180  tablet 3   nitroGLYCERIN (NITROSTAT) 0.4 MG SL tablet PLACE 1 TABLET UNDER THE TONGUE EVERY 5 MINUTES AS NEEDED FOR CHEST PAIN. 25 tablet 11   No current facility-administered medications for this visit.    Socially he remains active. He is married has 5 children 10 grandchildren 2 great-grandchildren. Is no alcohol use. He typically scores below 75 and plays golf 3 days per week.  He typically scores in the 70s, better than his age.  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 Geanie Logan Endocrine: Negative; no heat/cold intolerance; no diabetes Neuro: Negative; no changes in balance, headaches Skin: Negative; No rashes or skin lesions Psychiatric: Negative; No behavioral problems, depression Sleep: Negative; No snoring, daytime sleepiness, hypersomnolence, bruxism, restless legs, hypnogognic hallucinations, no cataplexy Other comprehensive 14 point system review is negative.   PE BP (!) 145/83 (BP Location: Left Arm, Patient Position: Sitting, Cuff Size: Normal)   Pulse 69   Ht 5\' 7"  (1.702 m)   Wt 186 lb 12.8 oz (84.7 kg)   SpO2 100%   BMI 29.26 kg/m    Repeat blood pressure by me initially was 158/91  and on repeat was 133/78.  Wt Readings from Last 3 Encounters:  08/27/22 186 lb 12.8 oz (84.7 kg)  08/06/22 184 lb (83.5 kg)  06/06/22 187 lb 9.6 oz (85.1 kg)      Physical Exam BP (!) 145/83 (BP Location: Left Arm, Patient Position: Sitting, Cuff Size: Normal)   Pulse 69   Ht 5\' 7"  (1.702 m)   Wt 186 lb  12.8 oz (84.7 kg)   SpO2 100%   BMI 29.26 kg/m  General: Alert, oriented, no distress.  Skin: normal turgor, no rashes, warm and dry HEENT: Normocephalic, atraumatic. Pupils equal round and reactive to light; sclera anicteric; extraocular muscles intact;  Nose without nasal septal hypertrophy Mouth/Parynx benign; Mallinpatti scale 3 Neck: No JVD, no carotid bruits; normal carotid upstroke Lungs: clear to ausculatation and percussion; no wheezing or rales Chest wall: without tenderness to palpitation Heart: PMI not displaced, RRR, s1 s2 normal, 1/6 systolic murmur, no diastolic murmur, no rubs, gallops, thrills, or heaves Abdomen: soft, nontender; no hepatosplenomehaly, BS+; abdominal aorta nontender and not dilated by palpation. Back: no CVA tenderness Pulses 2+ Musculoskeletal: full range of motion, normal strength, no joint deformities Extremities: no clubbing cyanosis or edema, Homan's sign negative  Neurologic: grossly nonfocal; Cranial nerves grossly wnl Psychologic: Normal mood and affect   EKG Interpretation Date/Time:  Tuesday August 27 2022 08:10:25 EDT Ventricular Rate:  69 PR Interval:  168 QRS Duration:  92 QT Interval:  398 QTC Calculation: 426 R Axis:   51  Text Interpretation: Normal sinus rhythm Nonspecific ST and T wave abnormality When compared with ECG of 06-Aug-2022 15:55, No significant change was found Confirmed by Nicki Guadalajara ([id number]) on 08/27/2022 8:48:19 AM    March 18, 2022 ECG (independently read by me): Sinus bradycardia at 55  I personally reviewed the ECG from 03/14/2022 which shows sinus bradycardia at 55  August 31, 2021 ECG (independently read by me):  Sinus rhythm at 63, PVCs  January 04, 2021 ECG (independently read by me): Sinu bradycardia at 58; NSSTT changes, no ectopy   Jun 20, 2020 ECG (independently read by me): Normal sinus rhythm at 62 bpm.  No ectopy.  Normal intervals    October 2021 ECG (independently read by me):  Sinus  bradycardia at 56; no ectopy, normal intervals    May 21, 2019 ECG (independently read by me): Sinus bradycardia 54 bpm.  PR interval 188 ms, QTc interval 421 ms.  No ectopy.  January 13, 2019 ECG (independently read by me): Sinus bradycardia 54 bpm.  No ectopy.  PR interval 192 ms, QTc interval 4 3 ms  December 21, 2018 ECG (independently read by me): Normal sinus rhythm at 77 bpm.  Nonspecific ST changes.  Normal intervals  October 20119 ECG (independently read by me): Normal sinus rhythm at 61 bpm.  No ectopy.  Normal intervals.  April 2019 ECG (independently read by me):normal sinus rhythm at 61 bpm.  No ectopy.  No ST segment changes.  October 2018 ECG (independently read by me): Sinus bradycardia 57 bpm.  No ST segment changes.  Normal intervals.  August 2018 ECG (independently read by me): Sinus bradycardia 55 bpm.  Normal intervals.  No significant ST-T changes  June 2018 ECG (independently read by me): Sinus bradycardia 58 bpm.  Normal intervals.  No significant ST-T changes.  April 2018 ECG (independently read by me): Normal sinus rhythm at 63 bpm.  Normal intervals.  No ST segment changes.  September 2017 ECG (independently read by me):  Normal sinus rhythm at 60 bpm.  Nonspecific T changes.  Intervals are normal.  May 2017 ECG (independently read by me): Sinus bradycardia 55 bpm.  No ectopy.  Normal intervals.  Nondiagnostic T changes in lead 3.  April 2017 ECG (independently read by me): Sinus bradycardia 55 bpm.  No ectopy.  No significant ST changes.  Normal intervals.  01/12/2015 ECG (independently read by me): Sinus bradycardia 54 bpm.  No ectopy.  Normal intervals.  June 2016 ECG (independently read by me): Sinus bradycardia 58 bpm.  Normal intervals.  No ectopy. Non-specific T change aVL  November 2015 ECG (independently read by me): Sinus bradycardia 54 bpm.  No significant ST-T changes.  Normal intervals.  May 2015 ECG (independently read by me): Sinus  bradycardia 54 beats per minute.  No ectopy.  QTc interval 396 ms.  No significant ST changes.  04/22/2013 ECG (independently read by me): Sinus bradycardia 55 beats per minute. Nonspecific ST changes  03/01/2013 ECG (independently read by me): Sinus bradycardia 52 beats per minute. Normal intervals. No significant ST changes.  Prior ECG of 11/20/2012: Sinus rhythm at 51 beats per minute. QTc interval 400 ms. No significant ST changes.  LABS:     Latest Ref Rng & Units 08/06/2022    3:51 PM 03/14/2022    7:19 PM 03/14/2022    7:05 AM  BMP  Glucose 70 - 99 mg/dL 87  92  086   BUN 8 - 23 mg/dL 17  15  13    Creatinine 0.61 - 1.24 mg/dL 5.78  4.69  6.29   BUN/Creat Ratio 10 - 24   10   Sodium 135 - 145 mmol/L 136  136  142   Potassium 3.5 - 5.1 mmol/L 3.5  3.8  5.1   Chloride 98 - 111 mmol/L 98  101  101   CO2 22 - 32 mmol/L 29  26  26    Calcium 8.9 - 10.3 mg/dL 9.3  9.1  9.7       Latest Ref Rng & Units 03/14/2022    7:05 AM 07/27/2021    9:12 AM 12/20/2020    7:06 AM  Hepatic Function  Total Protein 6.0 - 8.5 g/dL 7.2  7.6  6.9   Albumin 3.7 - 4.7 g/dL 4.5  4.6  4.3   AST 0 - 40 IU/L 31  38  41   ALT 0 - 44 IU/L 28  36  34   Alk Phosphatase 44 - 121 IU/L 62  62  65   Total Bilirubin 0.0 - 1.2 mg/dL 1.0  0.7  0.8       Latest Ref Rng & Units 08/06/2022    3:51 PM 03/14/2022    7:19 PM 03/14/2022    7:05 AM  CBC  WBC 4.0 - 10.5 K/uL 7.8  6.9  6.6   Hemoglobin 13.0 - 17.0 g/dL 52.8  41.3  24.4   Hematocrit 39.0 - 52.0 % 50.5  50.5  49.0   Platelets 150 - 400 K/uL 280  228  221    Lab Results  Component Value Date   MCV 88.6 08/06/2022   MCV 89.2 03/14/2022   MCV 90 03/14/2022   Lab Results  Component Value Date   TSH 6.284 (H) 08/06/2022  .  Lipid Panel     Component Value Date/Time   CHOL 147 03/14/2022 0705   TRIG 108 03/14/2022 0705   TRIG 131 02/22/2013 0804   HDL 54 03/14/2022 0705  HDL 40 02/22/2013 0804   CHOLHDL 2.7 03/14/2022 0705   CHOLHDL 3.5 05/09/2015  1400   VLDL 31 (H) 05/09/2015 1400   LDLCALC 73 03/14/2022 0705   LDLCALC 50 02/22/2013 0804   RADIOLOGY: Dg Chest 2 View  08/17/2012   *RADIOLOGY REPORT*  Clinical Data: Renal cell carcinoma  CHEST - 2 VIEW  Comparison: 05/08/11  Findings: The cardiomediastinal silhouette is stable.  No acute infiltrate or pleural effusion.  No pulmonary edema.  Bony thorax is unremarkable.  IMPRESSION: No active disease.  No significant change.   Original Report Authenticated By: Natasha Mead, M.D.    CV STUDIES:  Echo 12/30/2018 Left ventricular ejection fraction, by visual estimation, is 55 to 60%. The left ventricle has normal function. There is no left ventricular hypertrophy. 2. Left ventricular diastolic parameters are indeterminate. 3. The left ventricle has no regional wall motion abnormalities. 4. Global right ventricle has normal systolic function.The right ventricular size is normal. No increase in right ventricular wall thickness. 5. Left atrial size was mildly dilated. 6. Right atrial size was normal. 7. The mitral valve is normal in structure. No evidence of mitral valve regurgitation. No evidence of mitral stenosis. 8. The tricuspid valve is normal in structure. Tricuspid valve regurgitation is not demonstrated. 9. The aortic valve is normal in structure. Aortic valve regurgitation is trivial. Mild aortic valve sclerosis without stenosis. 10. The pulmonic valve was grossly normal. Pulmonic valve regurgitation is not visualized. 11. The inferior vena cava is normal in size with greater than 50% respiratory variability, suggesting right atrial pressure of 3 mmHg.   CARDIAC CATHETERIZATION: 01/04/2019  Mid LM to Mid LAD lesion is 10% stenosed. 2nd Diag lesion is 40% stenosed. Mid LAD to Dist LAD lesion is 20% stenosed. Prox Cx to Mid Cx lesion is 5% stenosed. Ost Cx to Prox Cx lesion is 20% stenosed. 1st Mrg lesion is 40% stenosed. 2nd Mrg lesion is 30% stenosed. Ost RCA to Mid RCA  lesion is 20% stenosed. Acute Mrg lesion is 70% stenosed. Dist RCA lesion is 30% stenosed. RPAV lesion is 20% stenosed. The left ventricular systolic function is normal. LV end diastolic pressure is normal. RPDA lesion is 40% stenosed.   Widely patent stents in the LAD and circumflex vessel.  The LAD has 40% stenosis in a small diagonal branch arising just distal to the stent in the mid LAD.  There is mild 20% mid LAD narrowing; complex vessel has smooth 20% narrowing proximal to the stented segment.  Small marginal branches arising within the circumflex stent had ostial narrowing of 40 and 30%; the  right coronary artery has moderate diffuse calcification with irregularity with narrowing of 20 to 30% in the mid vessel, 30% prior to PDA, and with no significant restenosis at the site of distal PTCA.  A small acute marginal branch has 70 to 80% ostial narrowing in a small caliber vessel.   LVEDP 17 - 23 MM hG.   RECOMMENDATION: The catheterization study is not significantly changed from the patient's last catheterization in July 2018.  Atherectomy sites with stenting in the LAD and circumflex vessel remain widely patent.  There is mild small branch ostial narrowings.  The RCA is calcified with patent PTCA site.  Medical therapy will be continued.  Continue long-term DAPT as the patient has been on aspirin/Plavix.  Continue aggressive lipid-lowering therapy with target LDL less than 70.  Exercise prescription.        IMPRESSION:  1. Coronary artery disease involving native  coronary artery of native heart with angina pectoris (HCC)   2. CAD in native artery   3. Hyperlipidemia with target LDL less than 70   4. Mixed hyperlipidemia   5. Essential hypertension     ASSESSMENT AND PLAN: Nicholas Douglas is a young appearing active 89 year old gentleman who has CAD dating back to 77 and had undergone multiple interventions over a10 year period from 13 to 1997. He developed unstable angina  symptomatology in March 2015 and catheterization done initially at [facility] showed multivessel disease.  He underwent successful staged rotational coronary atherectomy initially involving the RCA in early March 2015, and on 04/27/2013 repeat intervention was done to the left circumflex system with rotational atherectomy.  At that time, he had a widely patent stent of his RCA, as well as a widely patent stent in his LAD.  He also had 60-70% mid LAD stenosis, which had not progressed. In 2017 he noticed a change in symptomatology with the development of more shortness of breath, and no energy and felt occasional episodes of vague chest discomfort with possible left arm radiation.  He was hospitalized in late April 2017 and was found to have progressive 99% distal RCA stenosis after the PDA takeoff.  His stents in the LAD and circumflex were widely patent.  There was mild concomitant CAD in the circumflex.  His RCA was diffusely diseased and calcified with narrowings of 30% of the mid segment, but there was an angulated area of narrowing calcified narrowing of 40% before the acute margin, which prevented a stent from getting beyond this.  He underwent successful PTCA  to the distal stenosis.  At catheterization in July 2018 his intervention sites were patent and there was no significant CAD progression. There was mild 30-35% narrowing in the circumflex and mild 40% narrowing in the RCA.  The stents were patent as was the prior PTCA site.  He has had issues with shortness of breath.  ARB therapy had to be discontinued secondary to hyperkalemia, but at the time he was also having a fair amount of exogenous potassium intake.  Because of progressive symptoms of increasing shortness of breath and upper back discomfort which he believed may have been his anginal equivalent definitive catheterization was  performed in November 2020.  The LAD and circumflex stents were patent his distal RCA PTCA site was without restenosis.   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 He was admitted to the hospital on June 13 and discharged on July 19, 2021 with increasing shortness of breath and some vague chest tightness.  An echo Doppler study from July 18, 2021 showed EF 55 to 60% with grade 1 diastolic dysfunction and mild LVH.  There was mild MR and aortic valve sclerosis.  His symptoms improved with diuresis and his isosorbide was titrated to 60 mg twice a day and Ranexa 500 mg was initiated.  Presently, he has been without anginal symptomatology.  He was evaluated [place] [facility] in March 14, 2022 with significant blood pressure elevation.  At that time, amlodipine was increased to 10 mg daily.  He continues to be on isosorbide 60 mg twice a day, atenolol 12.5 mg twice a day in addition to Ranexa 500 mg twice a day.  I reviewed his most recent ER evaluation from July 2 at [facility] as well as his most recent echo Doppler study from August 19, 2022.  He continues to have a EF  greater than 55% with mild LA dilatation, trivial MR, mild AR, and no TR.  Presently, he had increase Lasix from 2 times per week to daily.  Today he had experienced some dizziness.  I have suggested that he change his Lasix to every other day as needed rather than daily and I am slightly reducing amlodipine to 5 mg.  He will be following up with Dr. Bethann Punches in the next several weeks.  He continues to be on DAPT with aspirin/Plavix.  He is on Livalo 4 mg for hyperlipidemia in addition to Zetia 10 mg.  He did not tolerate other statins.  He continues to be on Vascepa 1 g twice a day.  I will see him in approximately 5 months for follow-up evaluation and prior to that office visit we will obtain repeat laboratory.    Lennette Bihari, MD, [facility]  08/31/2022 6:45 PM

## 2022-08-30 ENCOUNTER — Other Ambulatory Visit: Payer: Self-pay | Admitting: General Practice

## 2022-08-30 DIAGNOSIS — I25810 Atherosclerosis of coronary artery bypass graft(s) without angina pectoris: Secondary | ICD-10-CM

## 2022-08-31 ENCOUNTER — Encounter: Payer: Self-pay | Admitting: Cardiovascular Disease

## 2022-09-06 ENCOUNTER — Other Ambulatory Visit: Payer: Self-pay | Admitting: Cardiovascular Disease

## 2022-09-29 ENCOUNTER — Other Ambulatory Visit: Payer: Self-pay | Admitting: Cardiovascular Disease

## 2022-10-17 ENCOUNTER — Other Ambulatory Visit: Payer: Self-pay | Admitting: Cardiovascular Disease

## 2022-11-28 ENCOUNTER — Other Ambulatory Visit: Payer: Self-pay | Admitting: Cardiovascular Disease

## 2022-12-06 ENCOUNTER — Emergency Department: Payer: Medicare Other

## 2022-12-06 ENCOUNTER — Other Ambulatory Visit: Payer: Self-pay

## 2022-12-06 DIAGNOSIS — R0789 Other chest pain: Secondary | ICD-10-CM | POA: Diagnosis present

## 2022-12-06 LAB — CBC
HCT: 47.3 % (ref 39.0–52.0)
Hemoglobin: 15.8 g/dL (ref 13.0–17.0)
MCH: 29.9 pg (ref 26.0–34.0)
MCHC: 33.4 g/dL (ref 30.0–36.0)
MCV: 89.6 fL (ref 80.0–100.0)
Platelets: 257 10*3/uL (ref 150–400)
RBC: 5.28 MIL/uL (ref 4.22–5.81)
RDW: 12.7 % (ref 11.5–15.5)
WBC: 9.2 10*3/uL (ref 4.0–10.5)
nRBC: 0 % (ref 0.0–0.2)

## 2022-12-06 LAB — BASIC METABOLIC PANEL
Anion gap: 8 (ref 5–15)
BUN: 21 mg/dL (ref 8–23)
CO2: 26 mmol/L (ref 22–32)
Calcium: 9.3 mg/dL (ref 8.9–10.3)
Chloride: 102 mmol/L (ref 98–111)
Creatinine, Ser: 1.23 mg/dL (ref 0.61–1.24)
GFR, Estimated: 56 mL/min — ABNORMAL LOW (ref >60)
Glucose, Bld: 128 mg/dL — ABNORMAL HIGH (ref 70–99)
Potassium: 4.2 mmol/L (ref 3.5–5.1)
Sodium: 136 mmol/L (ref 135–145)

## 2022-12-06 LAB — TROPONIN I (HIGH SENSITIVITY): Troponin I (High Sensitivity): 7 ng/L (ref <18)

## 2022-12-06 NOTE — ED Triage Notes (Signed)
Pt feels tightening in his chest and behind his neck.  Some nausea.  Feels the same as his other MI's. Took one nitroglycerin at home with some mild relief.  Some shob as well but states "I never breathe deep"

## 2022-12-07 ENCOUNTER — Encounter (HOSPITAL_COMMUNITY): Payer: Self-pay

## 2022-12-07 ENCOUNTER — Inpatient Hospital Stay (HOSPITAL_COMMUNITY)
Admission: EM | Admit: 2022-12-07 | Discharge: 2022-12-10 | DRG: 287 | Disposition: A | Discharge status: Home / Self Care | Payer: Medicare Other | Attending: Internal Medicine | Admitting: Internal Medicine

## 2022-12-07 ENCOUNTER — Emergency Department
Admission: EM | Admit: 2022-12-07 | Discharge: 2022-12-07 | Disposition: A | Discharge status: Home / Self Care | Payer: Medicare Other | Attending: Emergency Medicine | Admitting: Emergency Medicine

## 2022-12-07 ENCOUNTER — Other Ambulatory Visit: Payer: Self-pay

## 2022-12-07 ENCOUNTER — Emergency Department (HOSPITAL_COMMUNITY): Payer: Medicare Other

## 2022-12-07 DIAGNOSIS — I252 Old myocardial infarction: Secondary | ICD-10-CM

## 2022-12-07 DIAGNOSIS — Z8521 Personal history of malignant neoplasm of larynx: Secondary | ICD-10-CM

## 2022-12-07 DIAGNOSIS — R011 Cardiac murmur, unspecified: Secondary | ICD-10-CM | POA: Diagnosis present

## 2022-12-07 DIAGNOSIS — N400 Enlarged prostate without lower urinary tract symptoms: Secondary | ICD-10-CM | POA: Diagnosis present

## 2022-12-07 DIAGNOSIS — I25110 Atherosclerotic heart disease of native coronary artery with unstable angina pectoris: Secondary | ICD-10-CM | POA: Diagnosis not present

## 2022-12-07 DIAGNOSIS — Z7982 Long term (current) use of aspirin: Secondary | ICD-10-CM

## 2022-12-07 DIAGNOSIS — Z79899 Other long term (current) drug therapy: Secondary | ICD-10-CM

## 2022-12-07 DIAGNOSIS — Z885 Allergy status to narcotic agent status: Secondary | ICD-10-CM

## 2022-12-07 DIAGNOSIS — E7800 Pure hypercholesterolemia, unspecified: Secondary | ICD-10-CM | POA: Diagnosis present

## 2022-12-07 DIAGNOSIS — R5383 Other fatigue: Secondary | ICD-10-CM | POA: Diagnosis present

## 2022-12-07 DIAGNOSIS — I130 Hypertensive heart and chronic kidney disease with heart failure and stage 1 through stage 4 chronic kidney disease, or unspecified chronic kidney disease: Secondary | ICD-10-CM | POA: Diagnosis present

## 2022-12-07 DIAGNOSIS — Z85528 Personal history of other malignant neoplasm of kidney: Secondary | ICD-10-CM

## 2022-12-07 DIAGNOSIS — Z87442 Personal history of urinary calculi: Secondary | ICD-10-CM

## 2022-12-07 DIAGNOSIS — R079 Chest pain, unspecified: Secondary | ICD-10-CM | POA: Diagnosis not present

## 2022-12-07 DIAGNOSIS — I160 Hypertensive urgency: Secondary | ICD-10-CM | POA: Diagnosis present

## 2022-12-07 DIAGNOSIS — N182 Chronic kidney disease, stage 2 (mild): Secondary | ICD-10-CM | POA: Diagnosis present

## 2022-12-07 DIAGNOSIS — I10 Essential (primary) hypertension: Secondary | ICD-10-CM | POA: Diagnosis present

## 2022-12-07 DIAGNOSIS — Z7952 Long term (current) use of systemic steroids: Secondary | ICD-10-CM

## 2022-12-07 DIAGNOSIS — I5032 Chronic diastolic (congestive) heart failure: Secondary | ICD-10-CM | POA: Diagnosis present

## 2022-12-07 DIAGNOSIS — Z7902 Long term (current) use of antithrombotics/antiplatelets: Secondary | ICD-10-CM

## 2022-12-07 DIAGNOSIS — Q619 Cystic kidney disease, unspecified: Secondary | ICD-10-CM

## 2022-12-07 DIAGNOSIS — Z8249 Family history of ischemic heart disease and other diseases of the circulatory system: Secondary | ICD-10-CM

## 2022-12-07 DIAGNOSIS — Z905 Acquired absence of kidney: Secondary | ICD-10-CM

## 2022-12-07 DIAGNOSIS — F17210 Nicotine dependence, cigarettes, uncomplicated: Secondary | ICD-10-CM | POA: Diagnosis present

## 2022-12-07 DIAGNOSIS — Z860100 Personal history of colon polyps, unspecified: Secondary | ICD-10-CM

## 2022-12-07 DIAGNOSIS — Z85820 Personal history of malignant melanoma of skin: Secondary | ICD-10-CM

## 2022-12-07 DIAGNOSIS — Z955 Presence of coronary angioplasty implant and graft: Secondary | ICD-10-CM

## 2022-12-07 DIAGNOSIS — Z888 Allergy status to other drugs, medicaments and biological substances status: Secondary | ICD-10-CM

## 2022-12-07 LAB — BASIC METABOLIC PANEL
Anion gap: 10 (ref 5–15)
BUN: 19 mg/dL (ref 8–23)
CO2: 24 mmol/L (ref 22–32)
Calcium: 9.4 mg/dL (ref 8.9–10.3)
Chloride: 102 mmol/L (ref 98–111)
Creatinine, Ser: 1.26 mg/dL — ABNORMAL HIGH (ref 0.61–1.24)
GFR, Estimated: 55 mL/min — ABNORMAL LOW (ref >60)
Glucose, Bld: 117 mg/dL — ABNORMAL HIGH (ref 70–99)
Potassium: 4.2 mmol/L (ref 3.5–5.1)
Sodium: 136 mmol/L (ref 135–145)

## 2022-12-07 LAB — D-DIMER, QUANTITATIVE: D-Dimer, Quant: 0.27 ug{FEU}/mL (ref 0.00–0.50)

## 2022-12-07 LAB — CBC
HCT: 48.2 % (ref 39.0–52.0)
Hemoglobin: 16.1 g/dL (ref 13.0–17.0)
MCH: 29.8 pg (ref 26.0–34.0)
MCHC: 33.4 g/dL (ref 30.0–36.0)
MCV: 89.3 fL (ref 80.0–100.0)
Platelets: 264 10*3/uL (ref 150–400)
RBC: 5.4 MIL/uL (ref 4.22–5.81)
RDW: 12.8 % (ref 11.5–15.5)
WBC: 7.2 10*3/uL (ref 4.0–10.5)
nRBC: 0 % (ref 0.0–0.2)

## 2022-12-07 LAB — TROPONIN I (HIGH SENSITIVITY)
Troponin I (High Sensitivity): 8 ng/L (ref <18)
Troponin I (High Sensitivity): 7 ng/L (ref <18)
Troponin I (High Sensitivity): 7 ng/L (ref <18)

## 2022-12-07 MED ORDER — NITROGLYCERIN 0.4 MG SL SUBL
0.4000 mg | SUBLINGUAL_TABLET | Freq: Once | SUBLINGUAL | Status: AC
  Administered 2022-12-07: 0.4 mg via SUBLINGUAL
  Filled 2022-12-07: qty 1

## 2022-12-07 NOTE — ED Triage Notes (Signed)
PT arrived POV from home c/o CP that radiates to his back and his shoulder blades, pt states he went to [place] yesterday and they told him he was fine but he states something is wrong. He took 2 nitros today without much relieve.

## 2022-12-07 NOTE — Discharge Instructions (Signed)

## 2022-12-07 NOTE — ED Provider Notes (Addendum)
[place] EMERGENCY DEPARTMENT AT [facility] Provider Note   CSN: [id number] Arrival date & time: 12/07/22  1903     History  Chief Complaint  Patient presents with   Chest Pain    Nicholas Douglas is a 89 y.o. male with PMH CAD with history of multiple interventions dating back to 1987, 1991, 1993, 1997, 2015, 2017 with 3 stents and a R coronary atherectomy followed by stenting (patient of Dr. [name] now, last seen in 08/27/22), HTN, hLD, renal cancer s/p L nephrectomy, CKD, single kidney, HFpEF (EF 55%, 08/19/22) on prn Lasix 20 mg who presents to the ED for chest pain and pressure.  Patient states that yesterday he developed exertional substernal chest pain radiating to the back associated with clamminess, diaphoresis.  Also shortness of breath.  States that it feels like pain he had during his prior MI.  He was evaluated at [facility] ED during which time his pain subsided.  EKG, troponin x 2, chest x-ray, unremarkable and patient was discharged home.  Symptoms returned today while at rest at 3 PM.  He took 2 times nitroglycerin which helped the pain somewhat.  Pain is significantly improved now but not completely resolved.  He denies numbness or tingling, vision changes, fevers, cough, congestion, weight gain, orthopnea, PND, prior VTE.   Last heart cath was 02/03/19. Several vessels with mild stenosis. 70% stenosis of small acute marginal branch off the R coronary. Stents were patent.   Last echo 08/19/22 with EF 55%, no significant valve disease besides mild aortic regurgitation and trivial MR.   The history is provided by the patient and medical records.       Home Medications Prior to Admission medications   Medication Sig Start Date End Date Taking? Authorizing Provider  acetaminophen (TYLENOL) 500 MG tablet Take 500 mg by mouth every 6 (six) hours as needed (for pain.).    Yes [provider]  amLODipine (NORVASC) 10 MG tablet TAKE 1 TABLET BY MOUTH IN THE   EVENING Patient taking differently: Take 5 mg by mouth at bedtime. 09/30/22  Yes Lennette Bihari, MD  aspirin 81 MG tablet Take 81 mg by mouth every evening.   Yes [provider]  atenolol (TENORMIN) 25 MG tablet Take 0.5 tablets (12.5 mg total) by mouth daily. Patient taking differently: Take 12.5 mg by mouth 2 (two) times daily. 03/18/22  Yes Lennette Bihari, MD  azithromycin (ZITHROMAX) 250 MG tablet Take 250 mg by mouth daily. 12/05/22 12/10/22 Yes [provider]  carboxymethylcellulose (REFRESH PLUS) 0.5 % SOLN Place 1 drop into both eyes daily as needed.   Yes [provider]  clopidogrel (PLAVIX) 75 MG tablet TAKE 1 TABLET BY MOUTH DAILY 10/17/22  Yes Lennette Bihari, MD  ezetimibe (ZETIA) 10 MG tablet TAKE 1 TABLET BY MOUTH DAILY 09/06/22  Yes Lennette Bihari, MD  finasteride (PROSCAR) 5 MG tablet Take 1 tablet (5 mg total) by mouth daily. Patient taking differently: Take 2.5 mg by mouth daily. 12/22/13  Yes Ethelda Chick, MD  furosemide (LASIX) 20 MG tablet TAKE 1 TABLET BY MOUTH TWICE  WEEKLY Patient taking differently: Take 20 mg by mouth daily. Patient takes on Tuesday and Thursday 09/06/22  Yes Lennette Bihari, MD  Homeopathic Products [facility] RELIEF) FOAM Apply 1 Application topically daily as needed (Leg Cramps).   Yes [provider]  icosapent Ethyl (VASCEPA) 1 g capsule Take 1 capsule (1 g total) by mouth 2 (two) times  daily. 03/18/22  Yes Lennette Bihari, MD  isosorbide mononitrate (IMDUR) 60 MG 24 hr tablet TAKE 1 TABLET BY MOUTH EVERY  MORNING AND 1 TABLET BY MOUTH  EVERY NIGHT AT BEDTIME Patient taking differently: 60 mg daily. 11/12/21  Yes Lennette Bihari, MD  nitroGLYCERIN (NITROSTAT) 0.4 MG SL tablet PLACE 1 TABLET UNDER THE TONGUE EVERY 5 MINUTES AS NEEDED FOR CHEST PAIN. 08/30/22  Yes Lennette Bihari, MD  Pitavastatin Calcium (LIVALO) 4 MG TABS TAKE 1 TABLET BY MOUTH IN THE  EVENING 11/29/22  Yes Lennette Bihari, MD  predniSONE  (DELTASONE) 10 MG tablet Take 10 mg by mouth daily with breakfast. 12/05/22 12/19/22 Yes [provider]  ranolazine (RANEXA) 500 MG 12 hr tablet TAKE 1 TABLET BY MOUTH TWICE  DAILY Patient taking differently: Take 500 mg by mouth daily with supper. 11/01/21  Yes Lennette Bihari, MD      Allergies    Angiotensin receptor blockers, Lovastatin, Morphine, and Nifedipine    Review of Systems   Review of Systems per HPI above  Physical Exam Updated Vital Signs BP (!) 161/74   Pulse 68   Temp 97.8 F (36.6 C)   Resp 17   Ht 5\' 7"  (1.702 m)   Wt 85.3 kg   SpO2 100%   BMI 29.44 kg/m  Physical Exam Constitutional:      Appearance: He is well-developed.  HENT:     Head: Normocephalic and atraumatic.  Eyes:     Pupils: Pupils are equal, round, and reactive to light.  Cardiovascular:     Rate and Rhythm: Normal rate and regular rhythm.     Pulses:          Radial pulses are 2+ on the right side and 2+ on the left side.       Posterior tibial pulses are 2+ on the right side and 2+ on the left side.     Heart sounds: Normal heart sounds. Heart sounds not distant. No murmur heard.    No systolic murmur is present.     No diastolic murmur is present.     No friction rub. No gallop. No S3 or S4 sounds.  Pulmonary:     Effort: Pulmonary effort is normal.     Breath sounds: Wheezing (scattered expiratory) present.  Chest:     Chest wall: No mass or tenderness.  Abdominal:     Palpations: Abdomen is soft.  Musculoskeletal:     Cervical back: Normal range of motion.     Right lower leg: No tenderness. No edema.     Left lower leg: No tenderness. No edema.  Skin:    General: Skin is warm and dry.     Capillary Refill: Capillary refill takes less than 2 seconds.  Neurological:     General: No focal deficit present.     Mental Status: He is alert and oriented to person, place, and time.     Motor: No weakness.     ED Results / Procedures / Treatments   Labs (all labs  ordered are listed, but only abnormal results are displayed) Labs Reviewed  BASIC METABOLIC PANEL - Abnormal; Notable for the following components:      Result Value   Glucose, Bld 117 (*)    Creatinine, Ser 1.26 (*)    GFR, Estimated 55 (*)    All other components within normal limits  CBC  D-DIMER, QUANTITATIVE  TROPONIN I (HIGH SENSITIVITY)  TROPONIN I (HIGH  SENSITIVITY)    EKG None  Radiology DG Chest Portable 1 View  Result Date: 12/07/2022 CLINICAL DATA:  Acute coronary syndrome. Chest pain radiating to the back and shoulder. EXAM: PORTABLE CHEST 1 VIEW COMPARISON:  Chest radiograph dated 12/06/2022. FINDINGS: Shallow inspiration. No focal consolidation, pleural effusion, or pneumothorax. The cardiac silhouette is within normal limits. No acute osseous pathology. IMPRESSION: No active disease. Electronically Signed   By: Elgie Collard M.D.   On: 12/07/2022 21:08   DG Chest 2 View  Result Date: 12/06/2022 CLINICAL DATA:  Chest pain EXAM: CHEST - 2 VIEW COMPARISON:  08/06/2022 FINDINGS: Low lung volumes. Heart and mediastinal contours are within normal limits. No focal opacities or effusions. No acute bony abnormality. IMPRESSION: Low volumes.  No active cardiopulmonary disease. Electronically Signed   By: Charlett Nose M.D.   On: 12/06/2022 22:03    Procedures Procedures    Medications Ordered in ED Medications  nitroGLYCERIN (NITROSTAT) SL tablet 0.4 mg (has no administration in time range)    ED Course/ Medical Decision Making/ A&P Clinical Course as of 12/07/22 2329  Sat Dec 07, 2022  2328 Already handed off. Getting NTG for mild pain.  [GD]    Clinical Course User Index [GD] Karmen Stabs, MD                                 Medical Decision Making Amount and/or Complexity of Data Reviewed External Data Reviewed: ECG and notes. Labs: ordered. Decision-making details documented in ED Course. Radiology: ordered and independent interpretation performed.  Decision-making details documented in ED Course. ECG/medicine tests: ordered and independent interpretation performed. Decision-making details documented in ED Course.  Risk OTC drugs. Prescription drug management. Decision regarding hospitalization.   89 year old male with prior CAD and several cardiac risk factors, HFpEF who presents to the ED for ongoing chest pain. Known multivessel CAD with last cath being in 2020. This is his second presentation for chest pain that he states is similar to when he had a heart attack several years ago.  Seen at outside ED yesterday and discharged after unremarkable workup.  Symptoms returned while at rest today. DDX considered includes ACS, PE, pneumonia, pneumothorax, pericarditis myocarditis, CHF exacerbation, chest wall pain amongst others. Dissection also considered given initial BP was 211 systolic in triage but normal neurovascular exam, normal pulses; no tearing quality to pain. Vitals notable for initial HTN of 211 systolic (improved during my assessment), normal HR and O2 sat on room air, afebrile.  Exam is pertinent for scattered expiratory wheezing no crackles or focal abnormalities, normal cardiac exam, no peripheral edema or signs of volume overload.  Chest pain labs including delta Trope, CBC, metabolic panel, and D-dimer for low risk Wells/unable to use PERC to exclude PE, EKG, chest x-ray were obtained.  Labs show no leukocytosis, normal hemoglobin, normal electrolytes, creatinine near his baseline, Trope of 7 and 7, d-dimer negative. CXR shows no acute abnormality on my independent review.  EKG is notable for normal sinus rhythm and rate of 66, normal axis and intervals, no ST segment elevations or depressions, no pathologic T wave inversions, overall no overt ischemic changes, no significant change from prior EKG..   I spoke with cardiology Dr. Tollie Pizza. They have recommended admission to hospitalist for nuc med stress test.  I spoke with hospitalist who  is agreeable to admit patient however would like him to be completely pain-free prior to transport.  Reassessed  the patient and pain is almost entirely resolved though some residual in between the shoulder blades.  Given sublingual nitroglycerin.  Admitted to hospitalist for further care.         Final Clinical Impression(s) / ED Diagnoses Final diagnoses:  Chest pain, unspecified type    Rx / DC Orders ED Discharge Orders     None         Karmen Stabs, MD 12/07/22 1610    Karmen Stabs, MD 12/07/22 2331    Rexford Maus, DO 12/07/22 2356

## 2022-12-07 NOTE — ED Provider Notes (Signed)
[facility] Provider Note    Event Date/Time   First MD Initiated Contact with Patient 12/07/22 [phone]     (approximate)   History   Chest Pain   HPI Nicholas Douglas is a 89 y.o. male who is for evaluation of chest discomfort.  He has a history of heart problems including prior MI(s) and has a cardiologist in [place].  He said that he played golf earlier today and then he just had no energy by the afternoon and evening.  He felt the tightness in his chest and behind his neck with some nausea.  He said that his blood pressure was running high and he took one of his nitroglycerin.  By dinner he felt very warm all over.  He was concerned he might be having an MI so he came to the ED.  Since he has been here he said he feels much better and has none of the prior symptoms.  He is currently symptom-free and feels fine.     Physical Exam   Triage Vital Signs: ED Triage Vitals  Encounter Vitals Group     BP 12/06/22 2045 (!) 150/86     Systolic BP Percentile --      Diastolic BP Percentile --      Pulse Rate 12/06/22 2045 89     Resp 12/06/22 2045 18     Temp 12/06/22 2045 (!) 97.5 F (36.4 C)     Temp Source 12/06/22 2045 Oral     SpO2 12/06/22 2045 94 %     Weight 12/07/22 0127 85.3 kg (188 lb)     Height 12/07/22 0127 1.702 m (5\' 7" )     Head Circumference --      Peak Flow --      Pain Score 12/06/22 2044 7     Pain Loc --      Pain Education --      Exclude from Growth Chart --     Most recent vital signs: Vitals:   12/07/22 0335 12/07/22 0345  BP: (!) 148/78   Pulse: 72 69  Resp:  18  Temp:    SpO2: 97% 96%    General: Awake, no distress.  CV:  Good peripheral perfusion.  Regular rate and rhythm. Resp:  Normal effort. Speaking easily and comfortably, no accessory muscle usage nor intercostal retractions.   Abd:  No distention.  No tenderness to palpation.  No pulsatile masses. Other:  Normal mood and affect.   ED Results / Procedures  / Treatments   Labs (all labs ordered are listed, but only abnormal results are displayed) Labs Reviewed  BASIC METABOLIC PANEL - Abnormal; Notable for the following components:      Result Value   Glucose, Bld 128 (*)    GFR, Estimated 56 (*)    All other components within normal limits  CBC  TROPONIN I (HIGH SENSITIVITY)  TROPONIN I (HIGH SENSITIVITY)     EKG  ED ECG REPORT I, Loleta Rose, the attending physician, personally viewed and interpreted this ECG.  Date: 12/06/2022 EKG Time: 20:41 Rate: 88 Rhythm: normal sinus rhythm with sinus arrhythmia QRS Axis: normal Intervals: normal ST/T Wave abnormalities: normal Narrative Interpretation: no evidence of acute ischemia    RADIOLOGY I viewed and interpreted the patient's two-view chest x-ray.  No evidence of pneumonia nor other acute abnormality.  I also read the radiologist's report, which confirmed no acute findings.   PROCEDURES:  Critical Care performed: No  .  1-3 Lead EKG Interpretation  Performed by: Loleta Rose, MD Authorized by: Loleta Rose, MD       IMPRESSION / MDM / ASSESSMENT AND PLAN / ED COURSE  I reviewed the triage vital signs and the nursing notes.                              Differential diagnosis includes, but is not limited to, cardiac ischemia, ACS, pneumonia, pneumothorax, PE.  Patient's presentation is most consistent with acute presentation with potential threat to life or bodily function.  Labs/studies ordered: High-sensitivity troponin x 2, CBC, BMP, two-view chest x-ray  Interventions/Medications given:  Medications - No data to display  (Note:  hospital course my include additional interventions and/or labs/studies not listed above.)   Vital signs are stable and within normal limits.  EKG is reassuring with no evidence of ischemia.  The rest of the patient's workup is also very reassuring including normal high-sensitivity troponins x 2.  He has been in the emergency  department for more than 7 hours and he is asymptomatic.  Given his reassuring workup, although initially considered hospitalization given his cardiac history, I feel he would be appropriate for discharge and outpatient follow-up.  I pass along the results to the patient and his wife and they understand and agree with close follow-up and I gave strict return precautions should he develop new or worsening symptoms.  The patient is on the cardiac monitor to evaluate for evidence of arrhythmia and/or significant heart rate changes.       FINAL CLINICAL IMPRESSION(S) / ED DIAGNOSES   Final diagnoses:  Chest pain, unspecified type     Rx / DC Orders   ED Discharge Orders     None        Note:  This document was prepared using Dragon voice recognition software and may include unintentional dictation errors.   Loleta Rose, MD 12/07/22 [phone]

## 2022-12-08 ENCOUNTER — Observation Stay (HOSPITAL_COMMUNITY): Payer: Medicare Other

## 2022-12-08 DIAGNOSIS — Q619 Cystic kidney disease, unspecified: Secondary | ICD-10-CM | POA: Diagnosis not present

## 2022-12-08 DIAGNOSIS — I200 Unstable angina: Secondary | ICD-10-CM

## 2022-12-08 DIAGNOSIS — I130 Hypertensive heart and chronic kidney disease with heart failure and stage 1 through stage 4 chronic kidney disease, or unspecified chronic kidney disease: Secondary | ICD-10-CM | POA: Diagnosis present

## 2022-12-08 DIAGNOSIS — Z888 Allergy status to other drugs, medicaments and biological substances status: Secondary | ICD-10-CM | POA: Diagnosis not present

## 2022-12-08 DIAGNOSIS — Z7982 Long term (current) use of aspirin: Secondary | ICD-10-CM | POA: Diagnosis not present

## 2022-12-08 DIAGNOSIS — R0789 Other chest pain: Secondary | ICD-10-CM

## 2022-12-08 DIAGNOSIS — I252 Old myocardial infarction: Secondary | ICD-10-CM | POA: Diagnosis not present

## 2022-12-08 DIAGNOSIS — Z7952 Long term (current) use of systemic steroids: Secondary | ICD-10-CM | POA: Diagnosis not present

## 2022-12-08 DIAGNOSIS — I5021 Acute systolic (congestive) heart failure: Secondary | ICD-10-CM

## 2022-12-08 DIAGNOSIS — I160 Hypertensive urgency: Secondary | ICD-10-CM | POA: Diagnosis present

## 2022-12-08 DIAGNOSIS — Z955 Presence of coronary angioplasty implant and graft: Secondary | ICD-10-CM | POA: Diagnosis not present

## 2022-12-08 DIAGNOSIS — R072 Precordial pain: Secondary | ICD-10-CM | POA: Diagnosis not present

## 2022-12-08 DIAGNOSIS — Z8249 Family history of ischemic heart disease and other diseases of the circulatory system: Secondary | ICD-10-CM | POA: Diagnosis not present

## 2022-12-08 DIAGNOSIS — N182 Chronic kidney disease, stage 2 (mild): Secondary | ICD-10-CM | POA: Diagnosis present

## 2022-12-08 DIAGNOSIS — Z79899 Other long term (current) drug therapy: Secondary | ICD-10-CM | POA: Diagnosis not present

## 2022-12-08 DIAGNOSIS — I10 Essential (primary) hypertension: Secondary | ICD-10-CM | POA: Diagnosis not present

## 2022-12-08 DIAGNOSIS — Z85820 Personal history of malignant melanoma of skin: Secondary | ICD-10-CM | POA: Diagnosis not present

## 2022-12-08 DIAGNOSIS — I2089 Other forms of angina pectoris: Secondary | ICD-10-CM | POA: Diagnosis not present

## 2022-12-08 DIAGNOSIS — Z85528 Personal history of other malignant neoplasm of kidney: Secondary | ICD-10-CM | POA: Diagnosis not present

## 2022-12-08 DIAGNOSIS — R079 Chest pain, unspecified: Secondary | ICD-10-CM

## 2022-12-08 DIAGNOSIS — F17210 Nicotine dependence, cigarettes, uncomplicated: Secondary | ICD-10-CM | POA: Diagnosis present

## 2022-12-08 DIAGNOSIS — I5032 Chronic diastolic (congestive) heart failure: Secondary | ICD-10-CM | POA: Diagnosis present

## 2022-12-08 DIAGNOSIS — Z860100 Personal history of colon polyps, unspecified: Secondary | ICD-10-CM | POA: Diagnosis not present

## 2022-12-08 DIAGNOSIS — Z885 Allergy status to narcotic agent status: Secondary | ICD-10-CM | POA: Diagnosis not present

## 2022-12-08 DIAGNOSIS — I25110 Atherosclerotic heart disease of native coronary artery with unstable angina pectoris: Secondary | ICD-10-CM | POA: Diagnosis present

## 2022-12-08 DIAGNOSIS — Z8521 Personal history of malignant neoplasm of larynx: Secondary | ICD-10-CM | POA: Diagnosis not present

## 2022-12-08 DIAGNOSIS — N400 Enlarged prostate without lower urinary tract symptoms: Secondary | ICD-10-CM | POA: Diagnosis present

## 2022-12-08 DIAGNOSIS — Z7902 Long term (current) use of antithrombotics/antiplatelets: Secondary | ICD-10-CM | POA: Diagnosis not present

## 2022-12-08 DIAGNOSIS — Z905 Acquired absence of kidney: Secondary | ICD-10-CM | POA: Diagnosis not present

## 2022-12-08 DIAGNOSIS — E7800 Pure hypercholesterolemia, unspecified: Secondary | ICD-10-CM | POA: Diagnosis present

## 2022-12-08 DIAGNOSIS — Z87442 Personal history of urinary calculi: Secondary | ICD-10-CM | POA: Diagnosis not present

## 2022-12-08 LAB — CBC
HCT: 47 % (ref 39.0–52.0)
Hemoglobin: 15.2 g/dL (ref 13.0–17.0)
MCH: 29 pg (ref 26.0–34.0)
MCHC: 32.3 g/dL (ref 30.0–36.0)
MCV: 89.5 fL (ref 80.0–100.0)
Platelets: 236 10*3/uL (ref 150–400)
RBC: 5.25 MIL/uL (ref 4.22–5.81)
RDW: 12.7 % (ref 11.5–15.5)
WBC: 8.6 10*3/uL (ref 4.0–10.5)
nRBC: 0 % (ref 0.0–0.2)

## 2022-12-08 LAB — ECHOCARDIOGRAM COMPLETE
AR max vel: 1.93 cm2
AV Peak grad: 9.2 mm[Hg]
Ao pk vel: 1.52 m/s
Area-P 1/2: 2.37 cm2
Height: 67 in
MV M vel: 2.2 m/s
MV Peak grad: 19.4 mm[Hg]
Weight: 2979.2 [oz_av]

## 2022-12-08 LAB — BASIC METABOLIC PANEL
Anion gap: 8 (ref 5–15)
BUN: 18 mg/dL (ref 8–23)
CO2: 28 mmol/L (ref 22–32)
Calcium: 9.1 mg/dL (ref 8.9–10.3)
Chloride: 102 mmol/L (ref 98–111)
Creatinine, Ser: 1.26 mg/dL — ABNORMAL HIGH (ref 0.61–1.24)
GFR, Estimated: 55 mL/min — ABNORMAL LOW (ref >60)
Glucose, Bld: 125 mg/dL — ABNORMAL HIGH (ref 70–99)
Potassium: 4 mmol/L (ref 3.5–5.1)
Sodium: 138 mmol/L (ref 135–145)

## 2022-12-08 LAB — MAGNESIUM: Magnesium: 2.3 mg/dL (ref 1.7–2.4)

## 2022-12-08 LAB — BRAIN NATRIURETIC PEPTIDE: B Natriuretic Peptide: 74.4 pg/mL (ref 0.0–100.0)

## 2022-12-08 MED ORDER — RANOLAZINE ER 500 MG PO TB12
1000.0000 mg | ORAL_TABLET | Freq: Every day | ORAL | Status: DC
  Administered 2022-12-08 – 2022-12-09 (×2): 1000 mg via ORAL
  Filled 2022-12-08 (×2): qty 2

## 2022-12-08 MED ORDER — CLOPIDOGREL BISULFATE 75 MG PO TABS
75.0000 mg | ORAL_TABLET | Freq: Every day | ORAL | Status: DC
  Administered 2022-12-08 – 2022-12-10 (×3): 75 mg via ORAL
  Filled 2022-12-08 (×3): qty 1

## 2022-12-08 MED ORDER — ENOXAPARIN SODIUM 40 MG/0.4ML IJ SOSY
40.0000 mg | PREFILLED_SYRINGE | Freq: Every day | INTRAMUSCULAR | Status: DC
  Administered 2022-12-08 – 2022-12-10 (×2): 40 mg via SUBCUTANEOUS
  Filled 2022-12-08 (×2): qty 0.4

## 2022-12-08 MED ORDER — SORBITOL 70 % SOLN: 30.0000 mL | Freq: Every day | Status: DC | PRN

## 2022-12-08 MED ORDER — PERFLUTREN LIPID MICROSPHERE
1.0000 mL | INTRAVENOUS | Status: AC | PRN
  Administered 2022-12-08: 4 mL via INTRAVENOUS

## 2022-12-08 MED ORDER — ACETAMINOPHEN 650 MG RE SUPP: 650.0000 mg | Freq: Four times a day (QID) | RECTAL | Status: DC | PRN

## 2022-12-08 MED ORDER — FINASTERIDE 5 MG PO TABS
5.0000 mg | ORAL_TABLET | Freq: Every day | ORAL | Status: DC
  Administered 2022-12-08 – 2022-12-10 (×3): 5 mg via ORAL
  Filled 2022-12-08 (×3): qty 1

## 2022-12-08 MED ORDER — AZITHROMYCIN 250 MG PO TABS
250.0000 mg | ORAL_TABLET | Freq: Every day | ORAL | Status: AC
  Administered 2022-12-08 – 2022-12-09 (×2): 250 mg via ORAL
  Filled 2022-12-08 (×2): qty 1

## 2022-12-08 MED ORDER — ASPIRIN 81 MG PO TBEC
81.0000 mg | DELAYED_RELEASE_TABLET | Freq: Every evening | ORAL | Status: DC
  Administered 2022-12-08: 81 mg via ORAL
  Filled 2022-12-08: qty 1

## 2022-12-08 MED ORDER — MELATONIN 3 MG PO TABS: 3.0000 mg | ORAL_TABLET | Freq: Every evening | ORAL | Status: DC | PRN

## 2022-12-08 MED ORDER — NITROGLYCERIN NICU 2% OINTMENT
TOPICAL_OINTMENT | Freq: Two times a day (BID) | TRANSDERMAL | Status: DC
Start: 2022-12-08 — End: 2022-12-08

## 2022-12-08 MED ORDER — RANOLAZINE ER 500 MG PO TB12: 500.0000 mg | ORAL_TABLET | Freq: Every day | ORAL | Status: DC

## 2022-12-08 MED ORDER — EZETIMIBE 10 MG PO TABS
10.0000 mg | ORAL_TABLET | Freq: Every day | ORAL | Status: DC
  Administered 2022-12-08 – 2022-12-10 (×3): 10 mg via ORAL
  Filled 2022-12-08 (×3): qty 1

## 2022-12-08 MED ORDER — ISOSORBIDE MONONITRATE ER 60 MG PO TB24
60.0000 mg | ORAL_TABLET | Freq: Two times a day (BID) | ORAL | Status: DC
  Administered 2022-12-08 – 2022-12-10 (×4): 60 mg via ORAL
  Filled 2022-12-08 (×4): qty 1

## 2022-12-08 MED ORDER — ALBUTEROL SULFATE (2.5 MG/3ML) 0.083% IN NEBU: 2.5000 mg | INHALATION_SOLUTION | Freq: Four times a day (QID) | RESPIRATORY_TRACT | Status: DC | PRN

## 2022-12-08 MED ORDER — FUROSEMIDE 10 MG/ML IJ SOLN
20.0000 mg | Freq: Once | INTRAMUSCULAR | Status: AC
  Administered 2022-12-08: 20 mg via INTRAVENOUS
  Filled 2022-12-08: qty 2

## 2022-12-08 MED ORDER — LABETALOL HCL 5 MG/ML IV SOLN
10.0000 mg | INTRAVENOUS | Status: DC | PRN
  Administered 2022-12-09: 10 mg via INTRAVENOUS
  Filled 2022-12-08: qty 4

## 2022-12-08 MED ORDER — NITROGLYCERIN 2 % TD OINT
1.0000 [in_us] | TOPICAL_OINTMENT | Freq: Three times a day (TID) | TRANSDERMAL | Status: DC
  Filled 2022-12-08: qty 1

## 2022-12-08 MED ORDER — ONDANSETRON HCL 4 MG/2ML IJ SOLN: 4.0000 mg | Freq: Four times a day (QID) | INTRAMUSCULAR | Status: DC | PRN

## 2022-12-08 MED ORDER — ALBUTEROL SULFATE (2.5 MG/3ML) 0.083% IN NEBU
2.5000 mg | INHALATION_SOLUTION | Freq: Four times a day (QID) | RESPIRATORY_TRACT | Status: DC
  Administered 2022-12-08: 2.5 mg via RESPIRATORY_TRACT
  Filled 2022-12-08 (×2): qty 3

## 2022-12-08 MED ORDER — ONDANSETRON HCL 4 MG PO TABS: 4.0000 mg | ORAL_TABLET | Freq: Four times a day (QID) | ORAL | Status: DC | PRN

## 2022-12-08 MED ORDER — AMLODIPINE BESYLATE 5 MG PO TABS
5.0000 mg | ORAL_TABLET | Freq: Every day | ORAL | Status: DC
  Administered 2022-12-08 – 2022-12-09 (×3): 5 mg via ORAL
  Filled 2022-12-08 (×3): qty 1

## 2022-12-08 MED ORDER — ATENOLOL 25 MG PO TABS
12.5000 mg | ORAL_TABLET | Freq: Two times a day (BID) | ORAL | Status: DC
  Administered 2022-12-08: 12.5 mg via ORAL
  Filled 2022-12-08: qty 1
  Filled 2022-12-08: qty 0.5

## 2022-12-08 MED ORDER — CARVEDILOL 3.125 MG PO TABS
3.1250 mg | ORAL_TABLET | Freq: Two times a day (BID) | ORAL | Status: DC
  Administered 2022-12-09 – 2022-12-10 (×3): 3.125 mg via ORAL
  Filled 2022-12-08 (×3): qty 1

## 2022-12-08 MED ORDER — ACETAMINOPHEN 500 MG PO TABS: 500.0000 mg | ORAL_TABLET | Freq: Four times a day (QID) | ORAL | Status: DC | PRN

## 2022-12-08 MED ORDER — ICOSAPENT ETHYL 1 G PO CAPS
1.0000 g | ORAL_CAPSULE | Freq: Two times a day (BID) | ORAL | Status: DC
  Administered 2022-12-08 – 2022-12-10 (×5): 1 g via ORAL
  Filled 2022-12-08 (×6): qty 1

## 2022-12-08 MED ORDER — NITROGLYCERIN 0.4 MG SL SUBL: 0.4000 mg | SUBLINGUAL_TABLET | SUBLINGUAL | Status: DC | PRN

## 2022-12-08 NOTE — H&P (Signed)
History and Physical    Patient: Nicholas Douglas:[id number] DOB: September 20, 1931 DOA: 12/07/2022 DOS: the patient was seen and examined on 12/08/2022 PCP: Danella Penton, MD  Patient coming from: Home  Chief Complaint:  Chief Complaint  Patient presents with   Chest Pain   HPI: Nicholas Douglas is a 89 y.o. 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  The last time he required any intervention was in March 2015 when he had rotational atherectomy of the left circumflex.  He has had several cardiac catheterizations since that time that have not required intervention.  His last cardiac cath was November 2020.  He follows with Dr. [name] of [facility].  The patient has had trouble with decreased energy for couple of months but its gotten much worse over the last couple of weeks.  He is having lots of trouble wheezing.  He does continue to play golf.  Yesterday around the 13th hole he felt extremely exhausted.  He developed tightness in his chest he felt very clammy.  He also had some discomfort between his shoulder blades.  Because of the symptoms he went to [facility].  Serial troponins were done and they were negative so he was sent home.  By that time he just went to bed.  When he woke up this morning the discomfort continued.  He took a couple of nitroglycerin at home over the course of the day.  They did seem to bring the pain down a couple of notches but it did not go away so his wife drove him here for evaluation.  The patient did receive nitroglycerin prior to my evaluation.  He says his chest tightness is gone at this time he still feels the fatigue and just does not feel well but the central chest tightness is gone and the pain between his shoulder blades is improved.  2 days ago the patient was seen by his primary care doctor and was found to be wheezing.  He was treated prophylactically with prednisone and  antibiotics.  He has been taking these but they have made no difference in his shortness of breath. In the emergency department the patient did require nitroglycerin because of chest tightness.  By the time of my evaluation he no longer had any chest tightness.  He is happy to be admitted to the hospital and is concerned about this discomfort.  The fatigue part in particular does remind him of his previous MIs.  His wife definitely do want a definitive answer.   Review of Systems: As mentioned in the history of present illness. All other systems reviewed and are negative. Past Medical History:  Diagnosis Date   BPH (benign prostatic hyperplasia)    Followed by [name]/urology   CAD S/P PTCA only of RPAV-PL 05/31/2015   99% --> 0%PAV - PTCA only (unable to advance STENT) due to RCA calcification - prox & distal RCA 30%; Patent LAD stent & Cx stent (~20% ISR).    Calculus of kidney    "that's how they found the cancer" (04/27/2013)   Coronary artery disease involving native coronary artery of native heart with unstable angina pectoris (HCC) 12/03/2011   S/P Cardiac angioplasty 1986, 1991, 1993, 1997.  Last cardiac catheterization 2001.  Cardiolite 05/2011 low risk with normal EF 65%.  Followed by cardiology/[name] every six months.    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   Myocardial infarction [facility])    Other abnormal blood chemistry    Other nonspecific abnormal serum enzyme levels    Personal history of colonic polyps    Pure hypercholesterolemia    Tobacco use disorder    Unspecified congenital cystic kidney disease    Unstable angina (HCC) 04/05/2013; 05/2015   Past Surgical History:  Procedure  Laterality Date   APPENDECTOMY  2014   CARDIAC CATHETERIZATION N/A 05/30/2015   Procedure: Left Heart Cath and Coronary Angiography;  Surgeon: Lennette Bihari, MD;  Location: [facility] INVASIVE CV LAB;  Service: Cardiovascular;  Laterality: N/A;   CARDIAC CATHETERIZATION N/A 05/30/2015   Procedure: Coronary Balloon Angioplasty;  Surgeon: Lennette Bihari, MD;  Location: [facility] INVASIVE CV LAB;  Service: Cardiovascular;  Laterality: N/A;   Cardiolite  05/06/2011   low risk study; normal EF.  [facility]   carotid dopplers  03/08/2011   minimal plaque formation B. Symptoms: dizziness.   COLONOSCOPY  10/05/2008   single polyp, IH.  [name].  Repeat in 3 years.   CORONARY ANGIOPLASTY     "I've had 4" (04/27/2013)   CORONARY ANGIOPLASTY WITH STENT PLACEMENT  04/2013; 04/27/2013   "2 + 1" (04/27/2013)   EYE SURGERY  11/04/2013   Cataract surgery B; [facility].   LEFT HEART CATH AND CORONARY ANGIOGRAPHY N/A 08/05/2016   Procedure: Left Heart Cath and Coronary Angiography;  Surgeon: Swaziland, Peter M, MD;  Location[facility] INVASIVE CV LAB;  Service: Cardiovascular;  Laterality: N/A;   LEFT HEART CATH AND CORONARY ANGIOGRAPHY N/A 01/04/2019   Procedure: LEFT HEART CATH AND CORONARY ANGIOGRAPHY;  Surgeon: Lennette Bihari, MD;  Location: [facility] INVASIVE CV LAB;  Service: Cardiovascular;  Laterality: N/A;   MELANOMA EXCISION Left ~ 2007   "arm"   MELANOMA EXCISION  02/07/2015   R upper back. [facility]   MICROLARYNGOSCOPY N/A 04/06/2020   Procedure: MICROLARYNGOSCOPY WITH BIOPSY OF LARYNX;  Surgeon: Geanie Logan, MD;  Location: [facility] ORS;  Service: ENT;  Laterality: N/A;   NEPHRECTOMY Left 2006   PERCUTANEOUS CORONARY ROTOBLATOR INTERVENTION (PCI-R) N/A 04/06/2013   Procedure: PERCUTANEOUS CORONARY ROTOBLATOR INTERVENTION (PCI-R);  Surgeon: Peter M Swaziland, MD;  Location[facility] CATH LAB;  Service: Cardiovascular;  Laterality: N/A;   PERCUTANEOUS CORONARY STENT INTERVENTION (PCI-S) N/A 04/27/2013   Procedure: PERCUTANEOUS CORONARY STENT INTERVENTION (PCI-S);   Surgeon: Lennette Bihari, MD;  Location: [facility] CATH LAB;  Service: Cardiovascular;  Laterality: N/A;   Social History:  reports that he has quit smoking. His smoking use included cigars, pipe, and cigarettes. He has a 5 pack-year smoking history. He has quit using smokeless tobacco.  His smokeless tobacco use included chew. He reports current alcohol use of about 5.0 standard drinks of alcohol per week. He reports that he does not use drugs.  Allergies  Allergen Reactions   Angiotensin Receptor Blockers     Other reaction(s): Kidney Disorder Hyperkalemia   Lovastatin Other (See Comments)    Elevated liver enzymes   Morphine Hives   Nifedipine Other (See Comments)    Elevated liver enzymes    Family History  Problem Relation Age of Onset   Heart disease Mother  CAD, AAA.   AAA (abdominal aortic aneurysm) Mother     Prior to Admission medications   Medication Sig Start Date End Date Taking? Authorizing Provider  acetaminophen (TYLENOL) 500 MG tablet Take 500 mg by mouth every 6 (six) hours as needed (for pain.).    Yes [provider]  amLODipine (NORVASC) 10 MG tablet TAKE 1 TABLET BY MOUTH IN THE  EVENING Patient taking differently: Take 5 mg by mouth at bedtime. 09/30/22  Yes Lennette Bihari, MD  aspirin 81 MG tablet Take 81 mg by mouth every evening.   Yes [provider]  atenolol (TENORMIN) 25 MG tablet Take 0.5 tablets (12.5 mg total) by mouth daily. Patient taking differently: Take 12.5 mg by mouth 2 (two) times daily. 03/18/22  Yes Lennette Bihari, MD  azithromycin (ZITHROMAX) 250 MG tablet Take 250 mg by mouth daily. 12/05/22 12/10/22 Yes [provider]  carboxymethylcellulose (REFRESH PLUS) 0.5 % SOLN Place 1 drop into both eyes daily as needed.   Yes [provider]  clopidogrel (PLAVIX) 75 MG tablet TAKE 1 TABLET BY MOUTH DAILY 10/17/22  Yes Lennette Bihari, MD  ezetimibe (ZETIA) 10 MG tablet TAKE 1 TABLET BY MOUTH DAILY 09/06/22  Yes  Lennette Bihari, MD  finasteride (PROSCAR) 5 MG tablet Take 1 tablet (5 mg total) by mouth daily. Patient taking differently: Take 2.5 mg by mouth daily. 12/22/13  Yes Ethelda Chick, MD  furosemide (LASIX) 20 MG tablet TAKE 1 TABLET BY MOUTH TWICE  WEEKLY Patient taking differently: Take 20 mg by mouth daily. Patient takes on Tuesday and Thursday 09/06/22  Yes Lennette Bihari, MD  Homeopathic Products [facility] RELIEF) FOAM Apply 1 Application topically daily as needed (Leg Cramps).   Yes [provider]  icosapent Ethyl (VASCEPA) 1 g capsule Take 1 capsule (1 g total) by mouth 2 (two) times daily. 03/18/22  Yes Lennette Bihari, MD  isosorbide mononitrate (IMDUR) 60 MG 24 hr tablet TAKE 1 TABLET BY MOUTH EVERY  MORNING AND 1 TABLET BY MOUTH  EVERY NIGHT AT BEDTIME Patient taking differently: 60 mg daily. 11/12/21  Yes Lennette Bihari, MD  nitroGLYCERIN (NITROSTAT) 0.4 MG SL tablet PLACE 1 TABLET UNDER THE TONGUE EVERY 5 MINUTES AS NEEDED FOR CHEST PAIN. 08/30/22  Yes Lennette Bihari, MD  Pitavastatin Calcium (LIVALO) 4 MG TABS TAKE 1 TABLET BY MOUTH IN THE  EVENING 11/29/22  Yes Lennette Bihari, MD  predniSONE (DELTASONE) 10 MG tablet Take 10 mg by mouth daily with breakfast. 12/05/22 12/19/22 Yes [provider]  ranolazine (RANEXA) 500 MG 12 hr tablet TAKE 1 TABLET BY MOUTH TWICE  DAILY Patient taking differently: Take 500 mg by mouth daily with supper. 11/01/21  Yes Lennette Bihari, MD    Physical Exam: Vitals:   12/07/22 2145 12/07/22 2245 12/07/22 2300 12/07/22 2333  BP: (!) 164/81 133/76 (!) 161/74   Pulse: 65  68   Resp: 20 18 17    Temp:    98 F (36.7 C)  TempSrc:    Oral  SpO2:   100%   Weight:      Height:       Physical Exam:  General: No acute distress, well developed, well nourished HEENT: Normocephalic, atraumatic, PERRL Cardiovascular: Normal rate and rhythm. Distal pulses intact. Pulmonary: Normal pulmonary effort, Rales in bases  bilaterally Gastrointestinal: Nondistended abdomen, soft, mild tenderness, normoactive bowel sounds, no organomegaly Musculoskeletal:Normal ROM, no lower ext edema Lymphadenopathy: No cervical LAD.  Skin: Skin is warm and dry. Neuro: No focal deficits noted, AAOx3, hard of hearing PSYCH: Attentive and cooperative  Data Reviewed:  Results for orders placed or performed during the hospital encounter of 12/07/22 (from the past 24 hour(s))  Basic metabolic panel     Status: Abnormal   Collection Time: 12/07/22  7:24 PM  Result Value Ref Range   Sodium 136 135 - 145 mmol/L   Potassium 4.2 3.5 - 5.1 mmol/L   Chloride 102 98 - 111 mmol/L   CO2 24 22 - 32 mmol/L   Glucose, Bld 117 (H) 70 - 99 mg/dL   BUN 19 8 - 23 mg/dL   Creatinine, Ser 1.61 (H) 0.61 - 1.24 mg/dL   Calcium 9.4 8.9 - 09.6 mg/dL   GFR, Estimated 55 (L) >60 mL/min   Anion gap 10 5 - 15  CBC     Status: None   Collection Time: 12/07/22  7:24 PM  Result Value Ref Range   WBC 7.2 4.0 - 10.5 K/uL   RBC 5.40 4.22 - 5.81 MIL/uL   Hemoglobin 16.1 13.0 - 17.0 g/dL   HCT 04.5 40.9 - 81.1 %   MCV 89.3 80.0 - 100.0 fL   MCH 29.8 26.0 - 34.0 pg   MCHC 33.4 30.0 - 36.0 g/dL   RDW 91.4 78.2 - 95.6 %   Platelets 264 150 - 400 K/uL   nRBC 0.0 0.0 - 0.2 %  Troponin I (High Sensitivity)     Status: None   Collection Time: 12/07/22  7:24 PM  Result Value Ref Range   Troponin I (High Sensitivity) 7 <18 ng/L  Troponin I (High Sensitivity)     Status: None   Collection Time: 12/07/22  9:58 PM  Result Value Ref Range   Troponin I (High Sensitivity) 7 <18 ng/L  D-dimer, quantitative     Status: None   Collection Time: 12/07/22  9:58 PM  Result Value Ref Range   D-Dimer, Quant 0.27 0.00 - 0.50 ug/mL-FEU     Assessment and Plan: Chest tightness and shortness of breath  - the patient wass also feeling very diaphoretic and has no energy.  His symptoms are concerning but they have been going on for couple days now and his troponin is  negative.  Cardiology has recommended getting a nuclear stress test.  The patient's tells me that many years ago he had a stress test which was negative but he continued to have symptoms so he had later had a cardiac catheterization which was positive for 3 blockages.  Because of this the family is not excited about getting another stress test.  They would like to speak to the cardiologist in the morning. Possible mild CHF exacerbation -the patient has significant crackles bilaterally in the bases.  BN P is pending.  Will order a dose of IV Lasix at this time and follow.  Consider repeat echocardiogram.  His last echo was last year.  Htn -we will continue the patient's outpatient medications.  He did receive nitroglycerin here in the emergency department. Monitor    Advance Care Planning:   Code Status: Prior  The patient wants to be full code and names his wife as a Runner, broadcasting/film/video.  Consults: [facility]  Family Communication: Significant other at bedside  Severity of Illness: The appropriate patient status for this patient is OBSERVATION. Observation status is judged to be reasonable and necessary in order to provide the required intensity of service to ensure the patient's  safety. The patient's presenting symptoms, physical exam findings, and initial radiographic and laboratory data in the context of their medical condition is felt to place them at decreased risk for further clinical deterioration. Furthermore, it is anticipated that the patient will be medically stable for discharge from the hospital within 2 midnights of admission.   Author: Buena Irish, MD 12/08/2022 12:07 AM  For on call review [email or web address].

## 2022-12-08 NOTE — Progress Notes (Signed)
Echocardiogram 2D Echocardiogram has been performed.  Nicholas Douglas 12/08/2022, 2:27 PM

## 2022-12-08 NOTE — ED Notes (Signed)
ED TO INPATIENT HANDOFF REPORT  ED Nurse Name and Phone #: [phone]  S Name/Age/Gender Nicholas Douglas 89 y.o. male Room/Bed: 020C/020C  Code Status   Code Status: Full Code  Home/SNF/Other Home Patient oriented to: self, place, time, and situation Is this baseline? Yes   Triage Complete: Triage complete  Chief Complaint Chest pain [R07.9]  Triage Note PT arrived POV from home c/o CP that radiates to his back and his shoulder blades, pt states he went to  yesterday and they told him he was fine but he states something is wrong. He took 2 nitros today without much relieve.    Allergies Allergies  Allergen Reactions   Angiotensin Receptor Blockers     Other reaction(s): Kidney Disorder Hyperkalemia   Lovastatin Other (See Comments)    Elevated liver enzymes   Morphine Hives   Nifedipine Other (See Comments)    Elevated liver enzymes    Level of Care/Admitting Diagnosis ED Disposition     ED Disposition  Admit   Condition  --   Comment  Hospital Area: [facility] [[id number]]  Level of Care: Telemetry Cardiac [[id number]]  May place patient in observation at [facility] or [facility] if equivalent level of care is available:: No  Covid Evaluation: Asymptomatic - no recent exposure (last 10 days) testing not required  Diagnosis: Chest pain [564332]  Admitting Physician: Buena Irish [[id number]]  Attending Physician: Buena Irish [[id number]]          B Medical/Surgery History Past Medical History:  Diagnosis Date   BPH (benign prostatic hyperplasia)    Followed by [name]/urology   CAD S/P PTCA only of RPAV-PL 05/31/2015   99% --> 0%PAV - PTCA only (unable to advance STENT) due to RCA calcification - prox & distal RCA 30%; Patent LAD stent & Cx stent (~20% ISR).    Calculus of kidney    "that's how they found the cancer" (04/27/2013)   Coronary artery disease involving native coronary artery of native heart with unstable angina pectoris (HCC)  12/03/2011   S/P Cardiac angioplasty 1986, 1991, 1993, 1997.  Last cardiac catheterization 2001.  Cardiolite 05/2011 low risk with normal EF 65%.  Followed by cardiology/[name] every six months.    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   Myocardial infarction [facility])    Other abnormal blood chemistry    Other nonspecific abnormal serum enzyme levels    Personal history of colonic polyps    Pure hypercholesterolemia    Tobacco use disorder    Unspecified congenital cystic kidney disease    Unstable angina (HCC) 04/05/2013; 05/2015   Past Surgical History:  Procedure Laterality Date   APPENDECTOMY  2014   CARDIAC CATHETERIZATION N/A 05/30/2015   Procedure: Left Heart Cath and Coronary Angiography;  Surgeon: Lennette Bihari, MD;  Location: [facility] INVASIVE CV LAB;  Service: Cardiovascular;  Laterality: N/A;   CARDIAC CATHETERIZATION N/A 05/30/2015   Procedure: Coronary Balloon Angioplasty;  Surgeon: Lennette Bihari, MD;  Location: [facility] INVASIVE CV LAB;  Service: Cardiovascular;  Laterality: N/A;   Cardiolite  05/06/2011   low risk study; normal EF.  [facility]   carotid dopplers  03/08/2011   minimal plaque formation B. Symptoms: dizziness.   COLONOSCOPY  10/05/2008   single polyp, [facility].  [name].  Repeat in 3 years.   CORONARY ANGIOPLASTY     "I've had 4" (04/27/2013)   CORONARY ANGIOPLASTY WITH STENT PLACEMENT  04/2013; 04/27/2013   "2 + 1" (04/27/2013)   EYE SURGERY  11/04/2013   Cataract surgery B; [facility].   LEFT HEART CATH AND CORONARY ANGIOGRAPHY N/A 08/05/2016   Procedure: Left Heart Cath and Coronary Angiography;  Surgeon: Swaziland, Peter M, MD;  Location: [facility] INVASIVE CV LAB;  Service: Cardiovascular;  Laterality: N/A;    LEFT HEART CATH AND CORONARY ANGIOGRAPHY N/A 01/04/2019   Procedure: LEFT HEART CATH AND CORONARY ANGIOGRAPHY;  Surgeon: Lennette Bihari, MD;  Location: [facility] INVASIVE CV LAB;  Service: Cardiovascular;  Laterality: N/A;   MELANOMA EXCISION Left ~ 2007   "arm"   MELANOMA EXCISION  02/07/2015   R upper back. [facility]   MICROLARYNGOSCOPY N/A 04/06/2020   Procedure: MICROLARYNGOSCOPY WITH BIOPSY OF LARYNX;  Surgeon: Geanie Logan, MD;  Location: [facility] ORS;  Service: ENT;  Laterality: N/A;   NEPHRECTOMY Left 2006   PERCUTANEOUS CORONARY ROTOBLATOR INTERVENTION (PCI-R) N/A 04/06/2013   Procedure: PERCUTANEOUS CORONARY ROTOBLATOR INTERVENTION (PCI-R);  Surgeon: Peter M Swaziland, MD;  Location: [facility] CATH LAB;  Service: Cardiovascular;  Laterality: N/A;   PERCUTANEOUS CORONARY STENT INTERVENTION (PCI-S) N/A 04/27/2013   Procedure: PERCUTANEOUS CORONARY STENT INTERVENTION (PCI-S);  Surgeon: Lennette Bihari, MD;  Location: [facility] CATH LAB;  Service: Cardiovascular;  Laterality: N/A;     A IV Location/Drains/Wounds Patient Lines/Drains/Airways Status     Active Line/Drains/Airways     Name Placement date Placement time Site Days   Peripheral IV 12/07/22 20 G Anterior;Left Forearm 12/07/22  2258  Forearm  1            Intake/Output Last 24 hours No intake or output data in the 24 hours ending 12/08/22 0142  Labs/Imaging Results for orders placed or performed during the hospital encounter of 12/07/22 (from the past 48 hour(s))  Basic metabolic panel     Status: Abnormal   Collection Time: 12/07/22  7:24 PM  Result Value Ref Range   Sodium 136 135 - 145 mmol/L   Potassium 4.2 3.5 - 5.1 mmol/L   Chloride 102 98 - 111 mmol/L   CO2 24 22 - 32 mmol/L   Glucose, Bld 117 (H) 70 - 99 mg/dL    Comment: Glucose reference range applies only to samples taken after fasting for at least 8 hours.   BUN 19 8 - 23 mg/dL   Creatinine, Ser 1.61 (H) 0.61 - 1.24 mg/dL   Calcium 9.4 8.9 - 09.6 mg/dL   GFR, Estimated 55 (L) >60  mL/min    Comment: (NOTE) Calculated using the CKD-EPI Creatinine Equation (2021)    Anion gap 10 5 - 15    Comment: Performed at [facility], [address], [place], [place] [address]  CBC     Status: None   Collection Time: 12/07/22  7:24 PM  Result Value Ref Range   WBC 7.2 4.0 - 10.5 K/uL   RBC 5.40 4.22 - 5.81 MIL/uL   Hemoglobin 16.1 13.0 - 17.0 g/dL   HCT 98.1 19.1 - 47.8 %   MCV 89.3 80.0 - 100.0 fL   MCH 29.8 26.0 - 34.0 pg   MCHC 33.4 30.0 - 36.0 g/dL   RDW 29.5 62.1 -  15.5 %   Platelets 264 150 - 400 K/uL   nRBC 0.0 0.0 - 0.2 %    Comment: Performed at [facility], [address] [place], [place] [address]  Troponin I (High Sensitivity)     Status: None   Collection Time: 12/07/22  7:24 PM  Result Value Ref Range   Troponin I (High Sensitivity) 7 <18 ng/L    Comment: (NOTE) Elevated high sensitivity troponin I (hsTnI) values and significant  changes across serial measurements may suggest ACS but many other  chronic and acute conditions are known to elevate hsTnI results.  Refer to the "Links" section for chest pain algorithms and additional  guidance. Performed at [facility], [address], [place], [place] [address]   Troponin I (High Sensitivity)     Status: None   Collection Time: 12/07/22  9:58 PM  Result Value Ref Range   Troponin I (High Sensitivity) 7 <18 ng/L    Comment: (NOTE) Elevated high sensitivity troponin I (hsTnI) values and significant  changes across serial measurements may suggest ACS but many other  chronic and acute conditions are known to elevate hsTnI results.  Refer to the "Links" section for chest pain algorithms and additional  guidance. Performed at [facility], [address] [place], [place] [address]   D-dimer, quantitative     Status: None   Collection Time: 12/07/22  9:58 PM  Result Value Ref Range   D-Dimer, Quant 0.27 0.00 - 0.50 ug/mL-FEU    Comment: (NOTE) At the manufacturer cut-off  value of 0.5 g/mL FEU, this assay has a negative predictive value of 95-100%.This assay is intended for use in conjunction with a clinical pretest probability (PTP) assessment model to exclude pulmonary embolism (PE) and deep venous thrombosis (DVT) in outpatients suspected of PE or DVT. Results should be correlated with clinical presentation. Performed at [facility], [address], [place], [place] [address]   CBC     Status: None   Collection Time: 12/08/22  1:29 AM  Result Value Ref Range   WBC 8.6 4.0 - 10.5 K/uL   RBC 5.25 4.22 - 5.81 MIL/uL   Hemoglobin 15.2 13.0 - 17.0 g/dL   HCT 75.6 43.3 - 29.5 %   MCV 89.5 80.0 - 100.0 fL   MCH 29.0 26.0 - 34.0 pg   MCHC 32.3 30.0 - 36.0 g/dL   RDW 18.8 41.6 - 60.6 %   Platelets 236 150 - 400 K/uL   nRBC 0.0 0.0 - 0.2 %    Comment: Performed at [facility], [address], [place], [place] [address]   DG Chest Portable 1 View  Result Date: 12/07/2022 CLINICAL DATA:  Acute coronary syndrome. Chest pain radiating to the back and shoulder. EXAM: PORTABLE CHEST 1 VIEW COMPARISON:  Chest radiograph dated 12/06/2022. FINDINGS: Shallow inspiration. No focal consolidation, pleural effusion, or pneumothorax. The cardiac silhouette is within normal limits. No acute osseous pathology. IMPRESSION: No active disease. Electronically Signed   By: Elgie Collard M.D.   On: 12/07/2022 21:08   DG Chest 2 View  Result Date: 12/06/2022 CLINICAL DATA:  Chest pain EXAM: CHEST - 2 VIEW COMPARISON:  08/06/2022 FINDINGS: Low lung volumes. Heart and mediastinal contours are within normal limits. No focal opacities or effusions. No acute bony abnormality. IMPRESSION: Low volumes.  No active cardiopulmonary disease. Electronically Signed   By: Charlett Nose M.D.   On: 12/06/2022 22:03    Pending  Labs [employer] (From admission, onward)     Start     Ordered   12/08/22 0500  Magnesium  Tomorrow morning,   R        12/08/22 0013   12/08/22  0500  Basic metabolic panel  Tomorrow morning,   R        12/08/22 0013   12/08/22 0111  Brain natriuretic peptide  ONCE - URGENT,   URGENT        12/08/22 0110   12/08/22 0009  Brain natriuretic peptide  Add-on,   AD        12/08/22 0008            Vitals/Pain Today's Vitals   12/08/22 0015 12/08/22 0024 12/08/22 0030 12/08/22 0045  BP: (!) 152/78 (!) 152/78 (!) 204/94 (!) 179/92  Pulse:      Resp: 18  (!) 21 15  Temp:      TempSrc:      SpO2:      Weight:      Height:      PainSc:        Isolation Precautions No active isolations  Medications Medications  enoxaparin (LOVENOX) injection 40 mg (has no administration in time range)  acetaminophen (TYLENOL) tablet 500 mg (has no administration in time range)    Or  acetaminophen (TYLENOL) suppository 650 mg (has no administration in time range)  ondansetron (ZOFRAN) tablet 4 mg (has no administration in time range)    Or  ondansetron (ZOFRAN) injection 4 mg (has no administration in time range)  sorbitol 70 % solution 30 mL (has no administration in time range)  melatonin tablet 3 mg (has no administration in time range)  albuterol (PROVENTIL) (2.5 MG/3ML) 0.083% nebulizer solution 2.5 mg (has no administration in time range)  aspirin EC tablet 81 mg (has no administration in time range)  azithromycin (ZITHROMAX) tablet 250 mg (has no administration in time range)  amLODipine (NORVASC) tablet 5 mg (5 mg Oral Given 12/08/22 0024)  atenolol (TENORMIN) tablet 12.5 mg (12.5 mg Oral Given 12/08/22 0023)  ezetimibe (ZETIA) tablet 10 mg (has no administration in time range)  icosapent Ethyl (VASCEPA) 1 g capsule 1 g (1 g Oral Given 12/08/22 0035)  nitroGLYCERIN (NITROSTAT) SL tablet 0.4 mg (has no administration in time range)  ranolazine (RANEXA) 12 hr tablet 500 mg (has no administration in time range)  finasteride (PROSCAR) tablet 5 mg (has no administration in time range)  clopidogrel (PLAVIX) tablet 75 mg (has no  administration in time range)  nitroGLYCERIN NICU ointment 2% (has no administration in time range)  nitroGLYCERIN (NITROSTAT) SL tablet 0.4 mg (0.4 mg Sublingual Given 12/07/22 2333)  furosemide (LASIX) injection 20 mg (20 mg Intravenous Given 12/08/22 0023)    Mobility walks     Focused Assessments Cardiac Assessment Handoff:    Lab Results  Component Value Date   CKTOTAL 206 04/03/2013   CKMB 1.0 04/03/2013   TROPONINI 0.10 (H) 05/29/2015   Lab Results  Component Value Date   DDIMER 0.27 12/07/2022   Does the Patient currently have chest pain? No    R Recommendations: See Admitting Provider Note  Report given to:   Additional Notes:

## 2022-12-08 NOTE — Consult Note (Addendum)
Cardiology Consultation   Patient ID: Nicholas Douglas MRN: [id number]; DOB: 06/17/1931  Admit date: 12/07/2022 Date of Consult: 12/08/2022  PCP:  Nicholas Penton, MD   [facility] Providers Cardiologist:  Nicholas Guadalajara, MD        Patient Profile:   Nicholas Douglas is a 89 y.o. male with a history of CAD s/p multiple PCIs dating back to 85 (most recently in 05/2015), chronic diastolic CHF, hypertension, hyperlipidemia, renal cancer s/p nephrectomy in 2006 with CKD stage IIIa, and  BPH  who is being seen 12/08/2022 for the evaluation of chest pain at the request of Nicholas Douglas.  History of Present Illness:   Nicholas Douglas is a 89 year old male with a long history of CAD. He has undergone multiple PCIs in the past dating back to 1987 Most recent PCIs were a rotational atherectomy/ DES to LAD and then staged PCI with rotational atherectomy/ DES to LCX in 04/2013 and then PTCA alone to distal RCA in 05/2015. Last cardiac catheterization in 12/2018 showed widely patent stents to the LAD and LCX, 70% stenosis of a small acute marginal branch, and otherwise only mild to moderate non-obstructive disease. There was no significant changes from prior catheterization in 2018. Continue medical therapy was recommended. Last Echo in 08/2022 in the [facility] system showed LVEF of >55% with mild AI. He was last seen by Nicholas Douglas on 08/27/2022 at which time he had recently been seen in the ED for increasing shortness of breath and fatigue. He was given a IV Lasix in the ED with good urinary output in discharged. His PO Lasix was increased, and he reported feeling better at visit with Nicholas Douglas.  Patient presented to the [place] ED on 12/06/2022 for further evaluatin of chest tightness and severe fatigue. Upon arrival to the ED, BP was milldy elevated but vitals stable. EKG showed normal sinus rhythm with no acute ischemic changes. High-sensitivity troponin was negative x2. He was asymptomatic in the ED. Therefore,  he was felt to be stable for discharge.   He presented back to the [facility] ED on 12/07/2022 given return of symptoms. Upon arrival to the ED, BP markedly elevated at 211/93. EKG showed normal sinus rhythm with no acute ischemic changes. High-sensitivity troponin negative x2. D-dimer negative. Chest x-ray showed no acute findings. WBC 7.2, Hgb 16.1, Plts 264. Na 136, K 4.2, Glucose 117, BUN 19, Cr 1.26. He was given IV Lasix and sublingual Nitroglycerin in the ED. Patient was admitted and Cardiology consulted for further evaluation.  Patient reports he he has had dyspnea on exertion and fatigue for a couple of months. Symptoms initially improved with increase in Lasix. However, over the past month, they have started to worsen again. Patient described shortness of breath when walking to the mail box, and wife states he will sound short of breath even walking from room to room in the house. He denies any shortness of breath at rest. No orthopnea, PND, or edema. Over the last week, he also has started to have exertional chest tightness as well as pain in between his shoulder blades and in his lower back. He has taken a few dose of sublingual Nitroglycerin with improvement. Symptoms have remind him of his prior cardiac symptoms. He was seen by his PCP on 12/05/2022 and started on Prednisone and Azithromycin for a possible lung infection and wheezing. He went to play golf on 12/06/2022 and states he was not sure he was going to  make it because he felt so bad with chest tightness, upper back pain in between shoulder blades, shortness of breath, and fatigue.  He also reports some nausea with this. He came home and his wife states he looked bad which is why he went to the ED. Symptoms resolved while he was in the ED and he went home. However, he had recurrent chest tightness and pain between shoulder blades with associated diaphoresis yesterday afternoon around 3pm while sitting in his recliner which is why he came back  to the ED. He reports some very minimal chest soreness right now but feels better after receiving IV Lasix and sublingual Nitroglycerin in the ED. He denies any palpitations. He reports some recent positional lightheadedness/ dizziness about 1 month ago that improvement when PCP decreased Amlodipine and Imdur. No syncope. He reports an intermittent productive cough but this is not new. No fevers. No GI symptoms (other than the nausea while golfing on 11/1). No abnormal bleeding in urine or stools.   He states his systolic BP is usually in the 401U to 150s. Suspect recent steroid are contributing to markedly elevated BP readings.   Past Medical History:  Diagnosis Date   BPH (benign prostatic hyperplasia)    Followed by [name]/urology   CAD S/P PTCA only of RPAV-PL 05/31/2015   99% --> 0%PAV - PTCA only (unable to advance STENT) due to RCA calcification - prox & distal RCA 30%; Patent LAD stent & Cx stent (~20% ISR).    Calculus of kidney    "that's how they found the cancer" (04/27/2013)   Coronary artery disease involving native coronary artery of native heart with unstable angina pectoris (HCC) 12/03/2011   S/P Cardiac angioplasty 1986, 1991, 1993, 1997.  Last cardiac catheterization 2001.  Cardiolite 05/2011 low risk with normal EF 65%.  Followed by cardiology/[name] every six months.    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   Myocardial infarction [facility])    Other abnormal blood chemistry    Other nonspecific abnormal serum enzyme levels    Personal history of colonic polyps    Pure hypercholesterolemia    Tobacco use disorder    Unspecified congenital cystic kidney disease    Unstable angina  (HCC) 04/05/2013; 05/2015    Past Surgical History:  Procedure Laterality Date   APPENDECTOMY  2014   CARDIAC CATHETERIZATION N/A 05/30/2015   Procedure: Left Heart Cath and Coronary Angiography;  Surgeon: Lennette Bihari, MD;  Location: [facility] INVASIVE CV LAB;  Service: Cardiovascular;  Laterality: N/A;   CARDIAC CATHETERIZATION N/A 05/30/2015   Procedure: Coronary Balloon Angioplasty;  Surgeon: Lennette Bihari, MD;  Location: [facility] INVASIVE CV LAB;  Service: Cardiovascular;  Laterality: N/A;   Cardiolite  05/06/2011   low risk study; normal EF.  [facility]   carotid dopplers  03/08/2011   minimal plaque formation B. Symptoms: dizziness.   COLONOSCOPY  10/05/2008   single polyp, IH.  [name].  Repeat in 3 years.   CORONARY ANGIOPLASTY     "I've had 4" (04/27/2013)   CORONARY ANGIOPLASTY WITH STENT PLACEMENT  04/2013; 04/27/2013   "2 + 1" (04/27/2013)  EYE SURGERY  11/04/2013   Cataract surgery B; [facility].   LEFT HEART CATH AND CORONARY ANGIOGRAPHY N/A 08/05/2016   Procedure: Left Heart Cath and Coronary Angiography;  Surgeon: Swaziland, Peter M, MD;  Location: [facility] INVASIVE CV LAB;  Service: Cardiovascular;  Laterality: N/A;   LEFT HEART CATH AND CORONARY ANGIOGRAPHY N/A 01/04/2019   Procedure: LEFT HEART CATH AND CORONARY ANGIOGRAPHY;  Surgeon: Lennette Bihari, MD;  Location: [facility] INVASIVE CV LAB;  Service: Cardiovascular;  Laterality: N/A;   MELANOMA EXCISION Left ~ 2007   "arm"   MELANOMA EXCISION  02/07/2015   R upper back. [facility]   MICROLARYNGOSCOPY N/A 04/06/2020   Procedure: MICROLARYNGOSCOPY WITH BIOPSY OF LARYNX;  Surgeon: Geanie Logan, MD;  Location: [facility] ORS;  Service: ENT;  Laterality: N/A;   NEPHRECTOMY Left 2006   PERCUTANEOUS CORONARY ROTOBLATOR INTERVENTION (PCI-R) N/A 04/06/2013   Procedure: PERCUTANEOUS CORONARY ROTOBLATOR INTERVENTION (PCI-R);  Surgeon: Peter M Swaziland, MD;  Location: [facility] CATH LAB;  Service: Cardiovascular;  Laterality: N/A;   PERCUTANEOUS CORONARY STENT INTERVENTION (PCI-S) N/A 04/27/2013    Procedure: PERCUTANEOUS CORONARY STENT INTERVENTION (PCI-S);  Surgeon: Lennette Bihari, MD;  Location: [facility] CATH LAB;  Service: Cardiovascular;  Laterality: N/A;     Home Medications:  Prior to Admission medications   Medication Sig Start Date End Date Taking? Authorizing Provider  acetaminophen (TYLENOL) 500 MG tablet Take 500 mg by mouth every 6 (six) hours as needed (for pain.).    Yes [provider]  amLODipine (NORVASC) 10 MG tablet TAKE 1 TABLET BY MOUTH IN THE  EVENING Patient taking differently: Take 5 mg by mouth at bedtime. 09/30/22  Yes Lennette Bihari, MD  aspirin 81 MG tablet Take 81 mg by mouth every evening.   Yes [provider]  atenolol (TENORMIN) 25 MG tablet Take 0.5 tablets (12.5 mg total) by mouth daily. Patient taking differently: Take 12.5 mg by mouth 2 (two) times daily. 03/18/22  Yes Lennette Bihari, MD  azithromycin (ZITHROMAX) 250 MG tablet Take 250 mg by mouth daily. 12/05/22 12/10/22 Yes [provider]  carboxymethylcellulose (REFRESH PLUS) 0.5 % SOLN Place 1 drop into both eyes daily as needed.   Yes [provider]  clopidogrel (PLAVIX) 75 MG tablet TAKE 1 TABLET BY MOUTH DAILY 10/17/22  Yes Lennette Bihari, MD  ezetimibe (ZETIA) 10 MG tablet TAKE 1 TABLET BY MOUTH DAILY 09/06/22  Yes Lennette Bihari, MD  finasteride (PROSCAR) 5 MG tablet Take 1 tablet (5 mg total) by mouth daily. Patient taking differently: Take 2.5 mg by mouth daily. 12/22/13  Yes Ethelda Chick, MD  furosemide (LASIX) 20 MG tablet TAKE 1 TABLET BY MOUTH TWICE  WEEKLY Patient taking differently: Take 20 mg by mouth daily. Patient takes on Tuesday and Thursday 09/06/22  Yes Lennette Bihari, MD  Homeopathic Products [facility] RELIEF) FOAM Apply 1 Application topically daily as needed (Leg Cramps).   Yes [provider]  icosapent Ethyl (VASCEPA) 1 g capsule Take 1 capsule (1 g total) by mouth 2 (two) times daily. 03/18/22  Yes Lennette Bihari, MD   isosorbide mononitrate (IMDUR) 60 MG 24 hr tablet TAKE 1 TABLET BY MOUTH EVERY  MORNING AND 1 TABLET BY MOUTH  EVERY NIGHT AT BEDTIME Patient taking differently: 60 mg daily. 11/12/21  Yes Lennette Bihari, MD  nitroGLYCERIN (NITROSTAT) 0.4 MG SL tablet PLACE 1 TABLET UNDER THE TONGUE EVERY 5 MINUTES AS NEEDED FOR CHEST PAIN. 08/30/22  Yes Lennette Bihari,  MD  Pitavastatin Calcium (LIVALO) 4 MG TABS TAKE 1 TABLET BY MOUTH IN THE  EVENING 11/29/22  Yes Lennette Bihari, MD  predniSONE (DELTASONE) 10 MG tablet Take 10 mg by mouth daily with breakfast. 12/05/22 12/19/22 Yes [provider]  ranolazine (RANEXA) 500 MG 12 hr tablet TAKE 1 TABLET BY MOUTH TWICE  DAILY Patient taking differently: Take 500 mg by mouth daily with supper. 11/01/21  Yes Lennette Bihari, MD    Inpatient Medications: Scheduled Meds:  amLODipine  5 mg Oral QHS   aspirin EC  81 mg Oral QPM   atenolol  12.5 mg Oral BID   azithromycin  250 mg Oral Daily   clopidogrel  75 mg Oral Daily   enoxaparin (LOVENOX) injection  40 mg Subcutaneous Daily   ezetimibe  10 mg Oral Daily   finasteride  5 mg Oral Daily   icosapent Ethyl  1 g Oral BID   isosorbide mononitrate  60 mg Oral BID   ranolazine  1,000 mg Oral Q supper   Continuous Infusions:  PRN Meds: acetaminophen **OR** acetaminophen, albuterol, melatonin, nitroGLYCERIN, ondansetron **OR** ondansetron (ZOFRAN) IV, sorbitol  Allergies:    Allergies  Allergen Reactions   Angiotensin Receptor Blockers     Other reaction(s): Kidney Disorder Hyperkalemia   Lovastatin Other (See Comments)    Elevated liver enzymes   Morphine Hives   Nifedipine Other (See Comments)    Elevated liver enzymes    Social History:   Social History   Tobacco Use   Smoking status: Former    Current packs/day: 0.25    Average packs/day: 0.3 packs/day for 20.0 years (5.0 ttl pk-yrs)    Types: Cigars, Pipe, Cigarettes   Smokeless tobacco: Former    Types: Chew   Tobacco comments:     Smokes about 2 cigars two months     03/18/2022 don't smoke or chew  Substance Use Topics   Alcohol use: Yes    Alcohol/week: 5.0 standard drinks of alcohol    Types: 5 Glasses of wine per week     Family History:   Family History  Problem Relation Age of Onset   Heart disease Mother        CAD, AAA.   AAA (abdominal aortic aneurysm) Mother      ROS:  Please see the history of present illness.  Review of Systems  Constitutional:  Positive for diaphoresis. Negative for fever.  HENT:  Positive for congestion.   Respiratory:  Positive for cough and shortness of breath.   Cardiovascular:  Positive for chest pain. Negative for palpitations, orthopnea, leg swelling and PND.  Gastrointestinal:  Positive for nausea. Negative for blood in stool, melena and vomiting.  Genitourinary:  Negative for hematuria.  Musculoskeletal:  Negative for myalgias.  Neurological:  Positive for dizziness. Negative for loss of consciousness.  Douglas/Heme/Allergies:  Does not bruise/bleed easily.  Psychiatric/Behavioral:  Substance abuse: prior tobacco use.     Physical Exam/Data:   Vitals:   12/08/22 0403 12/08/22 0759 12/08/22 0811 12/08/22 0854  BP: (!) 165/86 (!) 175/71  (!) 187/85  Pulse: 72 (!) 58 (!) 57 69  Resp: 17 19 17 16   Temp: 97.6 F (36.4 C) 98.8 F (37.1 C)    TempSrc: Oral Oral    SpO2: 94% 94% 98% 95%  Weight:      Height:        Intake/Output Summary (Last 24 hours) at 12/08/2022 0941 Last data filed at 12/08/2022 0839 Gross per 24  hour  Intake 240 ml  Output 650 ml  Net -410 ml      12/08/2022    2:12 AM 12/07/2022    7:15 PM 12/07/2022    1:27 AM  Last 3 Weights  Weight (lbs) 186 lb 3.2 oz 188 lb 188 lb  Weight (kg) 84.46 kg 85.276 kg 85.276 kg     Body mass index is 29.16 kg/m.  General: 89 y.o. Caucasian male resting comfortably in no acute distress. HEENT: Normocephalic and atraumatic. Sclera clear.  Neck: Supple. No carotid bruits. No JVD. Heart: RRR.  Distinct S1 and S2. No murmurs, gallops, or rubs. Radial  pulses 2+ and equal bilaterally. Lungs: No increased work of breathing. Crackles noted in right base. No wheezes or rhonchi appreciated.  Abdomen: Soft, non-distended, and non-tender to palpation. Extremities: No lower extremity edema.    Skin: Warm and dry. Neuro: Alert and oriented x3. No focal deficits. Psych: Normal affect. Responds appropriately.   EKG:  The EKG was personally reviewed and demonstrates:  Normal sinus rhythm with no acute ST/T changes. Telemetry:  Telemetry was personally reviewed and demonstrates:  Normal sinus rhythm with rates in the 50s to 70s.  Relevant CV Studies:  Left Cardiac Catheterization 01/04/2019: Mid LM to Mid LAD lesion is 10% stenosed. 2nd Diag lesion is 40% stenosed. Mid LAD to Dist LAD lesion is 20% stenosed. Prox Cx to Mid Cx lesion is 5% stenosed. Ost Cx to Prox Cx lesion is 20% stenosed. 1st Mrg lesion is 40% stenosed. 2nd Mrg lesion is 30% stenosed. Ost RCA to Mid RCA lesion is 20% stenosed. Acute Mrg lesion is 70% stenosed. Dist RCA lesion is 30% stenosed. RPAV lesion is 20% stenosed. The left ventricular systolic function is normal. LV end diastolic pressure is normal. RPDA lesion is 40% stenosed.   Widely patent stents in the LAD and circumflex vessel.  The LAD has 40% stenosis in a small diagonal branch arising just distal to the stent in the mid LAD.  There is mild 20% mid LAD narrowing; complex vessel has smooth 20% narrowing proximal to the stented segment.  Small marginal branches arising within the circumflex stent had ostial narrowing of 40 and 30%; the  right coronary artery has moderate diffuse calcification with irregularity with narrowing of 20 to 30% in the mid vessel, 30% prior to PDA, and with no significant restenosis at the site of distal PTCA.  A small acute marginal branch has 70 to 80% ostial narrowing in a small caliber vessel.   LVEDP 17 - 23 MM hG.    Recommendation: The catheterization study is not significantly changed from the patient's last catheterization in July 2018.  Atherectomy sites with stenting in the LAD and circumflex vessel remain widely patent.  There is mild small branch ostial narrowings.  The RCA is calcified with patent PTCA site.  Medical therapy will be continued.  Continue long-term DAPT as the patient has been on aspirin/Plavix.  Continue aggressive lipid-lowering therapy with target LDL less than 70.  Exercise prescription.  Diagnostic Dominance: Right    _______________  Echocardiogram 08/19/2022 ([facility]): Impression: - Normal LV systolic function. - Normal RV systolic function. - Mild valvular regurgitation (mild AI, trivial MR). - No valvular stenosis.   Laboratory Data:  High Sensitivity Troponin:   Recent Labs  Lab 12/06/22 2049 12/07/22 0132 12/07/22 1924 12/07/22 2158  TROPONINIHS 7 8 7 7      Chemistry Recent Labs  Lab 12/06/22 2049 12/07/22 1924 12/08/22 0129  NA 136  136 138  K 4.2 4.2 4.0  CL 102 102 102  CO2 26 24 28   GLUCOSE 128* 117* 125*  BUN 21 19 18   CREATININE 1.23 1.26* 1.26*  CALCIUM 9.3 9.4 9.1  MG  --   --  2.3  GFRNONAA 56* 55* 55*  ANIONGAP 8 10 8      Hematology Recent Labs  Lab 12/06/22 2049 12/07/22 1924 12/08/22 0129  WBC 9.2 7.2 8.6  RBC 5.28 5.40 5.25  HGB 15.8 16.1 15.2  HCT 47.3 48.2 47.0  MCV 89.6 89.3 89.5  MCH 29.9 29.8 29.0  MCHC 33.4 33.4 32.3  RDW 12.7 12.8 12.7  PLT 257 264 236    BNP Recent Labs  Lab 12/08/22 0129  BNP 74.4    DDimer  Recent Labs  Lab 12/07/22 2158  DDIMER 0.27    Radiology/Studies:  DG Chest Portable 1 View  Result Date: 12/07/2022 CLINICAL DATA:  Acute coronary syndrome. Chest pain radiating to the back and shoulder. EXAM: PORTABLE CHEST 1 VIEW COMPARISON:  Chest radiograph dated 12/06/2022. FINDINGS: Shallow inspiration. No focal consolidation, pleural effusion, or pneumothorax. The cardiac silhouette is  within normal limits. No acute osseous pathology. IMPRESSION: No active disease. Electronically Signed   By: Elgie Collard M.D.   On: 12/07/2022 21:08   DG Chest 2 View  Result Date: 12/06/2022 CLINICAL DATA:  Chest pain EXAM: CHEST - 2 VIEW COMPARISON:  08/06/2022 FINDINGS: Low lung volumes. Heart and mediastinal contours are within normal limits. No focal opacities or effusions. No acute bony abnormality. IMPRESSION: Low volumes.  No active cardiopulmonary disease. Electronically Signed   By: Charlett Nose M.D.   On: 12/06/2022 22:03     Assessment and Plan:   Chest Pain CAD Patient has a long history of CAD s/p multiple PCIs dating back to 1. Last PCI was a PTCA in to distal RCA in 05/2015. Last cardiac catheterization in 12/2018 showed widely patent stents to the LAD and LCX, 70% stenosis of a small acute marginal branch, and otherwise only mild to moderate non-obstructive disease. There was no significant changes from prior catheterization in 2018 and continued medical therapy was recommended. He he presented to the ED on 11/1 and then again on 11/2 for evaluation of chest tightness, pain in between shoulder blades, shortness of breath and significant fatigue. High-sensitivity troponin remained negative both times. D-dimer negative.  - Overall he describes progressive symptoms over the last couple of months. Symptoms remind him of prior anginal symptoms have have improved with sublingual Nitro. He currently reports some very minimal chest soreness. - Will repeat limited Echo to assess LV function. - Continue Amlodipine 5mg  daily which he takes at night. Previously on 10mg  daily but this was recently decreased due to lightheadedness/ dizziness with improvement.  - Will increase Imdur to 60mg  twice daily. He was previously on this dose but PCP recently decreased to 60mg  in the morning and 30mg  in the evening due to lightheadedness/ dizziness. Will increase back to 60mg  twice daily. - Will  increase Ranexa to 1,000mg  twice daily.  - Will switch Atenolol to Coreg 3.125mg  twice daily. - Continue DAPT with Aspirin 81mg  daily and Plavix 75mg  daily.  - Continue statin/ Zetia.  - Will plan for [facility] tomorrow. The patient understands that risks include but are not limited to stroke (1 in 1000), death (1 in 1000), kidney failure [usually temporary] (1 in 500), bleeding (1 in 200), allergic reaction [possibly serious] (1 in 200), and agrees to proceed.  Chronic Diastolic CHF Recent Echo in 08/2022 at [facility] showed LVEF >55 with normal wall motion and grade 1 diastolic dysfunction. Patient does report worsening dyspnea on exertion over the last month or so but no other signs or symptoms of CHF. BNP 74. No edema on chest x-ray. He was given 1 dose of IV Lasix 20mg  in the ED with some improvement in symptoms.  - Crackles noted in right base. Otherwise, no signs of volume overload.  - Will hold off on additional diuresis for now.  Hypertensive  BP markedly elevated on arrival at 211/93. BP improved some but still significantly elevated. He was recently started on a Prednisone taper for a possible lung infection on 10/31 so suspect this may be the cause. He states systolic BP is usually in the 578I to 150s.  - Systolic BP currently in the 170s to 180s. - Will increase Imdur back to 60mg  twice daily. - Will stop Atenolol and switch to Coreg 3.125mg  twice daily. - Continue Amlodipine 5mg  daily. He was previously on 10mg  daily but had some lightheadedness/dizziness with this. Could consider trying 5mg  twice daily if BP does not improve with above changes. - He has previously had hyperkalemia with an ARB so would be hesitant to retry this or Spironolactone.  Hyperlipidemia Lipid panel in 08/2022: Total Cholesterol 167, Triglycerides 95, HDL 69.4. LDL 79. LDL goal <55.  - No longer on high-intensity statins due to transient LFT elevation and concern for dementia.  - Continue Livalo 4mg  daily, Zetia  10mg  daily, and Vascepa 2g twice daily.  - Could consider adding Nexlotol or PCSK9 inhibitor as an outpatient.  CKD Stage IIIa Patient has a solitary kidney following nephrectomy in 2006 for renal cancer. Baseline creatinine around 1.2 to 1.3.  - Creatinine stable at 1.26 today.    Risk Assessment/Risk Scores:   TIMI Risk Score for Unstable Angina or Non-ST Elevation MI:   The patient's TIMI risk score is 5, which indicates a 26% risk of all cause mortality, new or recurrent myocardial infarction or need for urgent revascularization in the next 14 days.{   New York Heart Association (NYHA) Functional Class NYHA Class III   For questions or updates, please contact [facility] Please consult [email or web address] for contact info under    Signed, Corrin Parker, PA-C  12/08/2022 9:41 AM   Attending note:  Patient seen and examined.  I reviewed his records and discussed the case with Ms. Larene Beach, agree with her above findings.  Mr. Voong is a functional 89 year old male with a long history of CAD and prior PCI's as discussed above.  Last cardiac catheterization was in 2020 which time he was noted to have patent stent sites in the LAD and circumflex with moderate acute marginal branch disease that was managed medically.  He presents now reporting both progressive exertional fatigue over period of months and more recently recurring chest tightness and shortness of breath consistent with angina.  He had a particularly bad episode on Friday when he was playing golf.  Since then he has had recurring symptoms with rest as well.  He presents hypertensive and without clear evidence of ACS based on cardiac enzymes.  ECG shows no acute changes.  He reports compliance with his medical therapy at baseline which is fairly well-rounded.  Also treated for possible URI by PCP recently.  Last assessment of LVEF was in July, LVEF normal at that time.  On examination he reports no active symptoms.   He is  afebrile, heart rate in the 70s in sinus rhythm by telemetry.  Remains hypertensive with systolics in the 170s to 190s and diastolics in the 80s.  Lungs exhibit decreased breath sounds without active wheezing, scattered rhonchi noted.  Cardiac exam with RRR and 2/6 systolic murmur, no gallop.  Pertinent lab work includes potassium 4.0, creatinine 1.26 which is stable, normal high-sensitivity troponin I levels, BNP 74, WBC 8.6, hemoglobin 15.2, platelets 236.  Chest x-ray reports no acute process.  I reviewed his ECG which shows sinus rhythm with increased voltage and nonspecific ST changes.  Patient presents with progressive exertional fatigue and accelerating angina, cardiac enzymes normal.  ECG without acute ST segment changes.  He is also hypertensive on stable medical therapy.  We discussed diagnostic cardiac options, and after reviewing the risks and benefits, plan is to proceed with a diagnostic cardiac catheterization for evaluation of coronary anatomy.  He is in agreement to proceed.  Continue aspirin, Plavix, Norvasc, Vascepa, Imdur, and Zetia.  Uptitrate Ranexa and switch atenolol to Coreg for better blood pressure control.  May need further adjustments after cardiac catheterization.  Hydrate for procedure and recheck BMET in a.m.  Jonelle Sidle, M.D., F.A.C.C.

## 2022-12-08 NOTE — Plan of Care (Signed)
  Problem: Education: Goal: Knowledge of General Education information will improve Description: Including pain rating scale, medication(s)/side effects and non-pharmacologic comfort measures Outcome: Progressing   Problem: Clinical Measurements: Goal: Ability to maintain clinical measurements within normal limits will improve Outcome: Progressing   

## 2022-12-08 NOTE — H&P (View-Only) (Signed)
Cardiology Consultation   Patient ID: LASARO PRIMM MRN: [id number]; DOB: 06/17/1931  Admit date: 12/07/2022 Date of Consult: 12/08/2022  PCP:  Danella Penton, MD   [facility] Providers Cardiologist:  Nicki Guadalajara, MD        Patient Profile:   Nicholas Douglas is a 89 y.o. male with a history of CAD s/p multiple PCIs dating back to 85 (most recently in 05/2015), chronic diastolic CHF, hypertension, hyperlipidemia, renal cancer s/p nephrectomy in 2006 with CKD stage IIIa, and  BPH  who is being seen 12/08/2022 for the evaluation of chest pain at the request of Dr. Renford Dills.  History of Present Illness:   [name] is a 89 year old male with a long history of CAD. He has undergone multiple PCIs in the past dating back to 1987 Most recent PCIs were a rotational atherectomy/ DES to LAD and then staged PCI with rotational atherectomy/ DES to LCX in 04/2013 and then PTCA alone to distal RCA in 05/2015. Last cardiac catheterization in 12/2018 showed widely patent stents to the LAD and LCX, 70% stenosis of a small acute marginal branch, and otherwise only mild to moderate non-obstructive disease. There was no significant changes from prior catheterization in 2018. Continue medical therapy was recommended. Last Echo in 08/2022 in the [facility] system showed LVEF of >55% with mild AI. He was last seen by Dr. [name] on 08/27/2022 at which time he had recently been seen in the ED for increasing shortness of breath and fatigue. He was given a IV Lasix in the ED with good urinary output in discharged. His PO Lasix was increased, and he reported feeling better at visit with Dr. [name].  Patient presented to the [place] ED on 12/06/2022 for further evaluatin of chest tightness and severe fatigue. Upon arrival to the ED, BP was milldy elevated but vitals stable. EKG showed normal sinus rhythm with no acute ischemic changes. High-sensitivity troponin was negative x2. He was asymptomatic in the ED. Therefore,  he was felt to be stable for discharge.   He presented back to the [facility] ED on 12/07/2022 given return of symptoms. Upon arrival to the ED, BP markedly elevated at 211/93. EKG showed normal sinus rhythm with no acute ischemic changes. High-sensitivity troponin negative x2. D-dimer negative. Chest x-ray showed no acute findings. WBC 7.2, Hgb 16.1, Plts 264. Na 136, K 4.2, Glucose 117, BUN 19, Cr 1.26. He was given IV Lasix and sublingual Nitroglycerin in the ED. Patient was admitted and Cardiology consulted for further evaluation.  Patient reports he he has had dyspnea on exertion and fatigue for a couple of months. Symptoms initially improved with increase in Lasix. However, over the past month, they have started to worsen again. Patient described shortness of breath when walking to the mail box, and wife states he will sound short of breath even walking from room to room in the house. He denies any shortness of breath at rest. No orthopnea, PND, or edema. Over the last week, he also has started to have exertional chest tightness as well as pain in between his shoulder blades and in his lower back. He has taken a few dose of sublingual Nitroglycerin with improvement. Symptoms have remind him of his prior cardiac symptoms. He was seen by his PCP on 12/05/2022 and started on Prednisone and Azithromycin for a possible lung infection and wheezing. He went to play golf on 12/06/2022 and states he was not sure he was going to  make it because he felt so bad with chest tightness, upper back pain in between shoulder blades, shortness of breath, and fatigue.  He also reports some nausea with this. He came home and his wife states he looked bad which is why he went to the ED. Symptoms resolved while he was in the ED and he went home. However, he had recurrent chest tightness and pain between shoulder blades with associated diaphoresis yesterday afternoon around 3pm while sitting in his recliner which is why he came back  to the ED. He reports some very minimal chest soreness right now but feels better after receiving IV Lasix and sublingual Nitroglycerin in the ED. He denies any palpitations. He reports some recent positional lightheadedness/ dizziness about 1 month ago that improvement when PCP decreased Amlodipine and Imdur. No syncope. He reports an intermittent productive cough but this is not new. No fevers. No GI symptoms (other than the nausea while golfing on 11/1). No abnormal bleeding in urine or stools.   He states his systolic BP is usually in the 401U to 150s. Suspect recent steroid are contributing to markedly elevated BP readings.   Past Medical History:  Diagnosis Date   BPH (benign prostatic hyperplasia)    Followed by [name]/urology   CAD S/P PTCA only of RPAV-PL 05/31/2015   99% --> 0%PAV - PTCA only (unable to advance STENT) due to RCA calcification - prox & distal RCA 30%; Patent LAD stent & Cx stent (~20% ISR).    Calculus of kidney    "that's how they found the cancer" (04/27/2013)   Coronary artery disease involving native coronary artery of native heart with unstable angina pectoris (HCC) 12/03/2011   S/P Cardiac angioplasty 1986, 1991, 1993, 1997.  Last cardiac catheterization 2001.  Cardiolite 05/2011 low risk with normal EF 65%.  Followed by cardiology/[name] every six months.    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   Myocardial infarction [facility])    Other abnormal blood chemistry    Other nonspecific abnormal serum enzyme levels    Personal history of colonic polyps    Pure hypercholesterolemia    Tobacco use disorder    Unspecified congenital cystic kidney disease    Unstable angina  (HCC) 04/05/2013; 05/2015    Past Surgical History:  Procedure Laterality Date   APPENDECTOMY  2014   CARDIAC CATHETERIZATION N/A 05/30/2015   Procedure: Left Heart Cath and Coronary Angiography;  Surgeon: Lennette Bihari, MD;  Location: [facility] INVASIVE CV LAB;  Service: Cardiovascular;  Laterality: N/A;   CARDIAC CATHETERIZATION N/A 05/30/2015   Procedure: Coronary Balloon Angioplasty;  Surgeon: Lennette Bihari, MD;  Location: [facility] INVASIVE CV LAB;  Service: Cardiovascular;  Laterality: N/A;   Cardiolite  05/06/2011   low risk study; normal EF.  [facility]   carotid dopplers  03/08/2011   minimal plaque formation B. Symptoms: dizziness.   COLONOSCOPY  10/05/2008   single polyp, IH.  [name].  Repeat in 3 years.   CORONARY ANGIOPLASTY     "I've had 4" (04/27/2013)   CORONARY ANGIOPLASTY WITH STENT PLACEMENT  04/2013; 04/27/2013   "2 + 1" (04/27/2013)  EYE SURGERY  11/04/2013   Cataract surgery B; [facility].   LEFT HEART CATH AND CORONARY ANGIOGRAPHY N/A 08/05/2016   Procedure: Left Heart Cath and Coronary Angiography;  Surgeon: Swaziland, Peter M, MD;  Location: [facility] INVASIVE CV LAB;  Service: Cardiovascular;  Laterality: N/A;   LEFT HEART CATH AND CORONARY ANGIOGRAPHY N/A 01/04/2019   Procedure: LEFT HEART CATH AND CORONARY ANGIOGRAPHY;  Surgeon: Lennette Bihari, MD;  Location: [facility] INVASIVE CV LAB;  Service: Cardiovascular;  Laterality: N/A;   MELANOMA EXCISION Left ~ 2007   "arm"   MELANOMA EXCISION  02/07/2015   R upper back. [facility]   MICROLARYNGOSCOPY N/A 04/06/2020   Procedure: MICROLARYNGOSCOPY WITH BIOPSY OF LARYNX;  Surgeon: Geanie Logan, MD;  Location: [facility] ORS;  Service: ENT;  Laterality: N/A;   NEPHRECTOMY Left 2006   PERCUTANEOUS CORONARY ROTOBLATOR INTERVENTION (PCI-R) N/A 04/06/2013   Procedure: PERCUTANEOUS CORONARY ROTOBLATOR INTERVENTION (PCI-R);  Surgeon: Peter M Swaziland, MD;  Location: [facility] CATH LAB;  Service: Cardiovascular;  Laterality: N/A;   PERCUTANEOUS CORONARY STENT INTERVENTION (PCI-S) N/A 04/27/2013    Procedure: PERCUTANEOUS CORONARY STENT INTERVENTION (PCI-S);  Surgeon: Lennette Bihari, MD;  Location: [facility] CATH LAB;  Service: Cardiovascular;  Laterality: N/A;     Home Medications:  Prior to Admission medications   Medication Sig Start Date End Date Taking? Authorizing Provider  acetaminophen (TYLENOL) 500 MG tablet Take 500 mg by mouth every 6 (six) hours as needed (for pain.).    Yes [provider]  amLODipine (NORVASC) 10 MG tablet TAKE 1 TABLET BY MOUTH IN THE  EVENING Patient taking differently: Take 5 mg by mouth at bedtime. 09/30/22  Yes Lennette Bihari, MD  aspirin 81 MG tablet Take 81 mg by mouth every evening.   Yes [provider]  atenolol (TENORMIN) 25 MG tablet Take 0.5 tablets (12.5 mg total) by mouth daily. Patient taking differently: Take 12.5 mg by mouth 2 (two) times daily. 03/18/22  Yes Lennette Bihari, MD  azithromycin (ZITHROMAX) 250 MG tablet Take 250 mg by mouth daily. 12/05/22 12/10/22 Yes [provider]  carboxymethylcellulose (REFRESH PLUS) 0.5 % SOLN Place 1 drop into both eyes daily as needed.   Yes [provider]  clopidogrel (PLAVIX) 75 MG tablet TAKE 1 TABLET BY MOUTH DAILY 10/17/22  Yes Lennette Bihari, MD  ezetimibe (ZETIA) 10 MG tablet TAKE 1 TABLET BY MOUTH DAILY 09/06/22  Yes Lennette Bihari, MD  finasteride (PROSCAR) 5 MG tablet Take 1 tablet (5 mg total) by mouth daily. Patient taking differently: Take 2.5 mg by mouth daily. 12/22/13  Yes Ethelda Chick, MD  furosemide (LASIX) 20 MG tablet TAKE 1 TABLET BY MOUTH TWICE  WEEKLY Patient taking differently: Take 20 mg by mouth daily. Patient takes on Tuesday and Thursday 09/06/22  Yes Lennette Bihari, MD  Homeopathic Products [facility] RELIEF) FOAM Apply 1 Application topically daily as needed (Leg Cramps).   Yes [provider]  icosapent Ethyl (VASCEPA) 1 g capsule Take 1 capsule (1 g total) by mouth 2 (two) times daily. 03/18/22  Yes Lennette Bihari, MD   isosorbide mononitrate (IMDUR) 60 MG 24 hr tablet TAKE 1 TABLET BY MOUTH EVERY  MORNING AND 1 TABLET BY MOUTH  EVERY NIGHT AT BEDTIME Patient taking differently: 60 mg daily. 11/12/21  Yes Lennette Bihari, MD  nitroGLYCERIN (NITROSTAT) 0.4 MG SL tablet PLACE 1 TABLET UNDER THE TONGUE EVERY 5 MINUTES AS NEEDED FOR CHEST PAIN. 08/30/22  Yes Lennette Bihari,  MD  Pitavastatin Calcium (LIVALO) 4 MG TABS TAKE 1 TABLET BY MOUTH IN THE  EVENING 11/29/22  Yes Lennette Bihari, MD  predniSONE (DELTASONE) 10 MG tablet Take 10 mg by mouth daily with breakfast. 12/05/22 12/19/22 Yes [provider]  ranolazine (RANEXA) 500 MG 12 hr tablet TAKE 1 TABLET BY MOUTH TWICE  DAILY Patient taking differently: Take 500 mg by mouth daily with supper. 11/01/21  Yes Lennette Bihari, MD    Inpatient Medications: Scheduled Meds:  amLODipine  5 mg Oral QHS   aspirin EC  81 mg Oral QPM   atenolol  12.5 mg Oral BID   azithromycin  250 mg Oral Daily   clopidogrel  75 mg Oral Daily   enoxaparin (LOVENOX) injection  40 mg Subcutaneous Daily   ezetimibe  10 mg Oral Daily   finasteride  5 mg Oral Daily   icosapent Ethyl  1 g Oral BID   isosorbide mononitrate  60 mg Oral BID   ranolazine  1,000 mg Oral Q supper   Continuous Infusions:  PRN Meds: acetaminophen **OR** acetaminophen, albuterol, melatonin, nitroGLYCERIN, ondansetron **OR** ondansetron (ZOFRAN) IV, sorbitol  Allergies:    Allergies  Allergen Reactions   Angiotensin Receptor Blockers     Other reaction(s): Kidney Disorder Hyperkalemia   Lovastatin Other (See Comments)    Elevated liver enzymes   Morphine Hives   Nifedipine Other (See Comments)    Elevated liver enzymes    Social History:   Social History   Tobacco Use   Smoking status: Former    Current packs/day: 0.25    Average packs/day: 0.3 packs/day for 20.0 years (5.0 ttl pk-yrs)    Types: Cigars, Pipe, Cigarettes   Smokeless tobacco: Former    Types: Chew   Tobacco comments:     Smokes about 2 cigars two months     03/18/2022 don't smoke or chew  Substance Use Topics   Alcohol use: Yes    Alcohol/week: 5.0 standard drinks of alcohol    Types: 5 Glasses of wine per week     Family History:   Family History  Problem Relation Age of Onset   Heart disease Mother        CAD, AAA.   AAA (abdominal aortic aneurysm) Mother      ROS:  Please see the history of present illness.  Review of Systems  Constitutional:  Positive for diaphoresis. Negative for fever.  HENT:  Positive for congestion.   Respiratory:  Positive for cough and shortness of breath.   Cardiovascular:  Positive for chest pain. Negative for palpitations, orthopnea, leg swelling and PND.  Gastrointestinal:  Positive for nausea. Negative for blood in stool, melena and vomiting.  Genitourinary:  Negative for hematuria.  Musculoskeletal:  Negative for myalgias.  Neurological:  Positive for dizziness. Negative for loss of consciousness.  Endo/Heme/Allergies:  Does not bruise/bleed easily.  Psychiatric/Behavioral:  Substance abuse: prior tobacco use.     Physical Exam/Data:   Vitals:   12/08/22 0403 12/08/22 0759 12/08/22 0811 12/08/22 0854  BP: (!) 165/86 (!) 175/71  (!) 187/85  Pulse: 72 (!) 58 (!) 57 69  Resp: 17 19 17 16   Temp: 97.6 F (36.4 C) 98.8 F (37.1 C)    TempSrc: Oral Oral    SpO2: 94% 94% 98% 95%  Weight:      Height:        Intake/Output Summary (Last 24 hours) at 12/08/2022 0941 Last data filed at 12/08/2022 0839 Gross per 24  hour  Intake 240 ml  Output 650 ml  Net -410 ml      12/08/2022    2:12 AM 12/07/2022    7:15 PM 12/07/2022    1:27 AM  Last 3 Weights  Weight (lbs) 186 lb 3.2 oz 188 lb 188 lb  Weight (kg) 84.46 kg 85.276 kg 85.276 kg     Body mass index is 29.16 kg/m.  General: 89 y.o. Caucasian male resting comfortably in no acute distress. HEENT: Normocephalic and atraumatic. Sclera clear.  Neck: Supple. No carotid bruits. No JVD. Heart: RRR.  Distinct S1 and S2. No murmurs, gallops, or rubs. Radial  pulses 2+ and equal bilaterally. Lungs: No increased work of breathing. Crackles noted in right base. No wheezes or rhonchi appreciated.  Abdomen: Soft, non-distended, and non-tender to palpation. Extremities: No lower extremity edema.    Skin: Warm and dry. Neuro: Alert and oriented x3. No focal deficits. Psych: Normal affect. Responds appropriately.   EKG:  The EKG was personally reviewed and demonstrates:  Normal sinus rhythm with no acute ST/T changes. Telemetry:  Telemetry was personally reviewed and demonstrates:  Normal sinus rhythm with rates in the 50s to 70s.  Relevant CV Studies:  Left Cardiac Catheterization 01/04/2019: Mid LM to Mid LAD lesion is 10% stenosed. 2nd Diag lesion is 40% stenosed. Mid LAD to Dist LAD lesion is 20% stenosed. Prox Cx to Mid Cx lesion is 5% stenosed. Ost Cx to Prox Cx lesion is 20% stenosed. 1st Mrg lesion is 40% stenosed. 2nd Mrg lesion is 30% stenosed. Ost RCA to Mid RCA lesion is 20% stenosed. Acute Mrg lesion is 70% stenosed. Dist RCA lesion is 30% stenosed. RPAV lesion is 20% stenosed. The left ventricular systolic function is normal. LV end diastolic pressure is normal. RPDA lesion is 40% stenosed.   Widely patent stents in the LAD and circumflex vessel.  The LAD has 40% stenosis in a small diagonal branch arising just distal to the stent in the mid LAD.  There is mild 20% mid LAD narrowing; complex vessel has smooth 20% narrowing proximal to the stented segment.  Small marginal branches arising within the circumflex stent had ostial narrowing of 40 and 30%; the  right coronary artery has moderate diffuse calcification with irregularity with narrowing of 20 to 30% in the mid vessel, 30% prior to PDA, and with no significant restenosis at the site of distal PTCA.  A small acute marginal branch has 70 to 80% ostial narrowing in a small caliber vessel.   LVEDP 17 - 23 MM hG.    Recommendation: The catheterization study is not significantly changed from the patient's last catheterization in July 2018.  Atherectomy sites with stenting in the LAD and circumflex vessel remain widely patent.  There is mild small branch ostial narrowings.  The RCA is calcified with patent PTCA site.  Medical therapy will be continued.  Continue long-term DAPT as the patient has been on aspirin/Plavix.  Continue aggressive lipid-lowering therapy with target LDL less than 70.  Exercise prescription.  Diagnostic Dominance: Right    _______________  Echocardiogram 08/19/2022 ([facility]): Impression: - Normal LV systolic function. - Normal RV systolic function. - Mild valvular regurgitation (mild AI, trivial MR). - No valvular stenosis.   Laboratory Data:  High Sensitivity Troponin:   Recent Labs  Lab 12/06/22 2049 12/07/22 0132 12/07/22 1924 12/07/22 2158  TROPONINIHS 7 8 7 7      Chemistry Recent Labs  Lab 12/06/22 2049 12/07/22 1924 12/08/22 0129  NA 136  136 138  K 4.2 4.2 4.0  CL 102 102 102  CO2 26 24 28   GLUCOSE 128* 117* 125*  BUN 21 19 18   CREATININE 1.23 1.26* 1.26*  CALCIUM 9.3 9.4 9.1  MG  --   --  2.3  GFRNONAA 56* 55* 55*  ANIONGAP 8 10 8      Hematology Recent Labs  Lab 12/06/22 2049 12/07/22 1924 12/08/22 0129  WBC 9.2 7.2 8.6  RBC 5.28 5.40 5.25  HGB 15.8 16.1 15.2  HCT 47.3 48.2 47.0  MCV 89.6 89.3 89.5  MCH 29.9 29.8 29.0  MCHC 33.4 33.4 32.3  RDW 12.7 12.8 12.7  PLT 257 264 236    BNP Recent Labs  Lab 12/08/22 0129  BNP 74.4    DDimer  Recent Labs  Lab 12/07/22 2158  DDIMER 0.27    Radiology/Studies:  DG Chest Portable 1 View  Result Date: 12/07/2022 CLINICAL DATA:  Acute coronary syndrome. Chest pain radiating to the back and shoulder. EXAM: PORTABLE CHEST 1 VIEW COMPARISON:  Chest radiograph dated 12/06/2022. FINDINGS: Shallow inspiration. No focal consolidation, pleural effusion, or pneumothorax. The cardiac silhouette is  within normal limits. No acute osseous pathology. IMPRESSION: No active disease. Electronically Signed   By: Elgie Collard M.D.   On: 12/07/2022 21:08   DG Chest 2 View  Result Date: 12/06/2022 CLINICAL DATA:  Chest pain EXAM: CHEST - 2 VIEW COMPARISON:  08/06/2022 FINDINGS: Low lung volumes. Heart and mediastinal contours are within normal limits. No focal opacities or effusions. No acute bony abnormality. IMPRESSION: Low volumes.  No active cardiopulmonary disease. Electronically Signed   By: Charlett Nose M.D.   On: 12/06/2022 22:03     Assessment and Plan:   Chest Pain CAD Patient has a long history of CAD s/p multiple PCIs dating back to 1. Last PCI was a PTCA in to distal RCA in 05/2015. Last cardiac catheterization in 12/2018 showed widely patent stents to the LAD and LCX, 70% stenosis of a small acute marginal branch, and otherwise only mild to moderate non-obstructive disease. There was no significant changes from prior catheterization in 2018 and continued medical therapy was recommended. He he presented to the ED on 11/1 and then again on 11/2 for evaluation of chest tightness, pain in between shoulder blades, shortness of breath and significant fatigue. High-sensitivity troponin remained negative both times. D-dimer negative.  - Overall he describes progressive symptoms over the last couple of months. Symptoms remind him of prior anginal symptoms have have improved with sublingual Nitro. He currently reports some very minimal chest soreness. - Will repeat limited Echo to assess LV function. - Continue Amlodipine 5mg  daily which he takes at night. Previously on 10mg  daily but this was recently decreased due to lightheadedness/ dizziness with improvement.  - Will increase Imdur to 60mg  twice daily. He was previously on this dose but PCP recently decreased to 60mg  in the morning and 30mg  in the evening due to lightheadedness/ dizziness. Will increase back to 60mg  twice daily. - Will  increase Ranexa to 1,000mg  twice daily.  - Will switch Atenolol to Coreg 3.125mg  twice daily. - Continue DAPT with Aspirin 81mg  daily and Plavix 75mg  daily.  - Continue statin/ Zetia.  - Will plan for [facility] tomorrow. The patient understands that risks include but are not limited to stroke (1 in 1000), death (1 in 1000), kidney failure [usually temporary] (1 in 500), bleeding (1 in 200), allergic reaction [possibly serious] (1 in 200), and agrees to proceed.  Chronic Diastolic CHF Recent Echo in 08/2022 at [facility] showed LVEF >55 with normal wall motion and grade 1 diastolic dysfunction. Patient does report worsening dyspnea on exertion over the last month or so but no other signs or symptoms of CHF. BNP 74. No edema on chest x-ray. He was given 1 dose of IV Lasix 20mg  in the ED with some improvement in symptoms.  - Crackles noted in right base. Otherwise, no signs of volume overload.  - Will hold off on additional diuresis for now.  Hypertensive  BP markedly elevated on arrival at 211/93. BP improved some but still significantly elevated. He was recently started on a Prednisone taper for a possible lung infection on 10/31 so suspect this may be the cause. He states systolic BP is usually in the 578I to 150s.  - Systolic BP currently in the 170s to 180s. - Will increase Imdur back to 60mg  twice daily. - Will stop Atenolol and switch to Coreg 3.125mg  twice daily. - Continue Amlodipine 5mg  daily. He was previously on 10mg  daily but had some lightheadedness/dizziness with this. Could consider trying 5mg  twice daily if BP does not improve with above changes. - He has previously had hyperkalemia with an ARB so would be hesitant to retry this or Spironolactone.  Hyperlipidemia Lipid panel in 08/2022: Total Cholesterol 167, Triglycerides 95, HDL 69.4. LDL 79. LDL goal <55.  - No longer on high-intensity statins due to transient LFT elevation and concern for dementia.  - Continue Livalo 4mg  daily, Zetia  10mg  daily, and Vascepa 2g twice daily.  - Could consider adding Nexlotol or PCSK9 inhibitor as an outpatient.  CKD Stage IIIa Patient has a solitary kidney following nephrectomy in 2006 for renal cancer. Baseline creatinine around 1.2 to 1.3.  - Creatinine stable at 1.26 today.    Risk Assessment/Risk Scores:   TIMI Risk Score for Unstable Angina or Non-ST Elevation MI:   The patient's TIMI risk score is 5, which indicates a 26% risk of all cause mortality, new or recurrent myocardial infarction or need for urgent revascularization in the next 14 days.{   New York Heart Association (NYHA) Functional Class NYHA Class III   For questions or updates, please contact [facility] Please consult [email or web address] for contact info under    Signed, Corrin Parker, PA-C  12/08/2022 9:41 AM   Attending note:  Patient seen and examined.  I reviewed his records and discussed the case with Ms. Larene Beach, agree with her above findings.  Mr. Voong is a functional 89 year old male with a long history of CAD and prior PCI's as discussed above.  Last cardiac catheterization was in 2020 which time he was noted to have patent stent sites in the LAD and circumflex with moderate acute marginal branch disease that was managed medically.  He presents now reporting both progressive exertional fatigue over period of months and more recently recurring chest tightness and shortness of breath consistent with angina.  He had a particularly bad episode on Friday when he was playing golf.  Since then he has had recurring symptoms with rest as well.  He presents hypertensive and without clear evidence of ACS based on cardiac enzymes.  ECG shows no acute changes.  He reports compliance with his medical therapy at baseline which is fairly well-rounded.  Also treated for possible URI by PCP recently.  Last assessment of LVEF was in July, LVEF normal at that time.  On examination he reports no active symptoms.   He is  afebrile, heart rate in the 70s in sinus rhythm by telemetry.  Remains hypertensive with systolics in the 170s to 190s and diastolics in the 80s.  Lungs exhibit decreased breath sounds without active wheezing, scattered rhonchi noted.  Cardiac exam with RRR and 2/6 systolic murmur, no gallop.  Pertinent lab work includes potassium 4.0, creatinine 1.26 which is stable, normal high-sensitivity troponin I levels, BNP 74, WBC 8.6, hemoglobin 15.2, platelets 236.  Chest x-ray reports no acute process.  I reviewed his ECG which shows sinus rhythm with increased voltage and nonspecific ST changes.  Patient presents with progressive exertional fatigue and accelerating angina, cardiac enzymes normal.  ECG without acute ST segment changes.  He is also hypertensive on stable medical therapy.  We discussed diagnostic cardiac options, and after reviewing the risks and benefits, plan is to proceed with a diagnostic cardiac catheterization for evaluation of coronary anatomy.  He is in agreement to proceed.  Continue aspirin, Plavix, Norvasc, Vascepa, Imdur, and Zetia.  Uptitrate Ranexa and switch atenolol to Coreg for better blood pressure control.  May need further adjustments after cardiac catheterization.  Hydrate for procedure and recheck BMET in a.m.  Jonelle Sidle, M.D., F.A.C.C.

## 2022-12-08 NOTE — Progress Notes (Signed)
Brief same day note:  Patient is 89 year old male with history of coronary artery disease,HTN,SCC of vocal cord, several cardiac cath the last 1 was in November 2020 presented with chest pain, generalized weakness.  He initially went to [facility], troponins were checked, they were negative and was sent home but he continued to have chest pain.  Nitroglycerin helped to some extent but not entirely.  Patient admitted for chest pain rule out. Cardiology consulted as per family request.  Plan for cardiac cath.  Patient seen and examined the bedside this morning.  Wife at bedside.  He was hypertensive.  Did not complain of chest pain during my evaluation.  On room air.  Assessment and plan:  Chest pain: Likely atypical chest pain.  But patient has history of significant coronary artery disease.  Case was discussed with cardiology, recommended nuclear stress test.  Due to family request, cardiology consulted.  Troponins have been normal.  Plan for cardiac cath, uptitrate Ranexa, atenolol changed to Coreg  Hypertension: Monitor blood pressure.  Continue current medications: Norvasc, Imdur, Coreg.  Continue as needed medications for severe hypertension  Hyperlipidemia: On Zetia,vascepa  CKD stage II: Baseline creatinine of 1.2.  Currently kidney function at baseline.

## 2022-12-09 ENCOUNTER — Encounter (HOSPITAL_COMMUNITY)
Admission: EM | Disposition: A | Discharge status: Home / Self Care | Payer: Self-pay | Source: Home / Self Care | Attending: Internal Medicine

## 2022-12-09 DIAGNOSIS — I25110 Atherosclerotic heart disease of native coronary artery with unstable angina pectoris: Secondary | ICD-10-CM | POA: Diagnosis not present

## 2022-12-09 DIAGNOSIS — I10 Essential (primary) hypertension: Secondary | ICD-10-CM | POA: Diagnosis not present

## 2022-12-09 DIAGNOSIS — I200 Unstable angina: Secondary | ICD-10-CM | POA: Diagnosis not present

## 2022-12-09 DIAGNOSIS — I2089 Other forms of angina pectoris: Secondary | ICD-10-CM | POA: Diagnosis not present

## 2022-12-09 HISTORY — PX: LEFT HEART CATH AND CORONARY ANGIOGRAPHY: CATH118249

## 2022-12-09 SURGERY — LEFT HEART CATH AND CORONARY ANGIOGRAPHY
Anesthesia: LOCAL

## 2022-12-09 MED ORDER — MIDAZOLAM HCL 2 MG/2ML IJ SOLN
INTRAMUSCULAR | Status: AC
  Filled 2022-12-09: qty 2

## 2022-12-09 MED ORDER — NITROGLYCERIN 1 MG/10 ML FOR IR/CATH LAB
INTRA_ARTERIAL | Status: AC
  Filled 2022-12-09: qty 10

## 2022-12-09 MED ORDER — HEPARIN SODIUM (PORCINE) 1000 UNIT/ML IJ SOLN
INTRAMUSCULAR | Status: AC
  Filled 2022-12-09: qty 10

## 2022-12-09 MED ORDER — LIDOCAINE HCL (PF) 1 % IJ SOLN
INTRAMUSCULAR | Status: AC
  Filled 2022-12-09: qty 30

## 2022-12-09 MED ORDER — LIDOCAINE HCL (PF) 1 % IJ SOLN
INTRAMUSCULAR | Status: DC | PRN
  Administered 2022-12-09: 2 mL via INTRADERMAL

## 2022-12-09 MED ORDER — ACETAMINOPHEN 325 MG PO TABS: 650.0000 mg | ORAL_TABLET | ORAL | Status: DC | PRN

## 2022-12-09 MED ORDER — VERAPAMIL HCL 2.5 MG/ML IV SOLN
INTRAVENOUS | Status: AC
  Filled 2022-12-09: qty 2

## 2022-12-09 MED ORDER — HYDRALAZINE HCL 20 MG/ML IJ SOLN: 10.0000 mg | INTRAMUSCULAR | Status: AC | PRN

## 2022-12-09 MED ORDER — SODIUM CHLORIDE 0.9 % IV SOLN: 250.0000 mL | INTRAVENOUS | Status: DC | PRN

## 2022-12-09 MED ORDER — SODIUM CHLORIDE 0.9 % IV SOLN
INTRAVENOUS | Status: AC
Start: 2022-12-09 — End: 2022-12-09

## 2022-12-09 MED ORDER — MIDAZOLAM HCL 2 MG/2ML IJ SOLN
INTRAMUSCULAR | Status: DC | PRN
  Administered 2022-12-09: 1 mg via INTRAVENOUS

## 2022-12-09 MED ORDER — SODIUM CHLORIDE 0.9 % WEIGHT BASED INFUSION: 3.0000 mL/kg/h | INTRAVENOUS | Status: DC

## 2022-12-09 MED ORDER — SODIUM CHLORIDE 0.9% FLUSH
3.0000 mL | INTRAVENOUS | Status: DC | PRN
Start: 2022-12-09 — End: 2022-12-10

## 2022-12-09 MED ORDER — IOHEXOL 350 MG/ML SOLN
INTRAVENOUS | Status: DC | PRN
  Administered 2022-12-09: 40 mL via INTRA_ARTERIAL

## 2022-12-09 MED ORDER — HEPARIN SODIUM (PORCINE) 1000 UNIT/ML IJ SOLN
INTRAMUSCULAR | Status: DC | PRN
  Administered 2022-12-09: 5000 [IU] via INTRA_ARTERIAL

## 2022-12-09 MED ORDER — FENTANYL CITRATE (PF) 100 MCG/2ML IJ SOLN
INTRAMUSCULAR | Status: DC | PRN
  Administered 2022-12-09: 25 ug via INTRAVENOUS

## 2022-12-09 MED ORDER — NITROGLYCERIN 1 MG/10 ML FOR IR/CATH LAB
INTRA_ARTERIAL | Status: DC | PRN
  Administered 2022-12-09: 100 ug via INTRA_ARTERIAL

## 2022-12-09 MED ORDER — SODIUM CHLORIDE 0.9% FLUSH
3.0000 mL | Freq: Two times a day (BID) | INTRAVENOUS | Status: DC
  Administered 2022-12-10: 3 mL via INTRAVENOUS

## 2022-12-09 MED ORDER — ASPIRIN 81 MG PO CHEW
81.0000 mg | CHEWABLE_TABLET | Freq: Once | ORAL | Status: AC
  Administered 2022-12-09: 81 mg via ORAL
  Filled 2022-12-09: qty 1

## 2022-12-09 MED ORDER — SODIUM CHLORIDE 0.9 % WEIGHT BASED INFUSION
3.0000 mL/kg/h | INTRAVENOUS | Status: AC
Start: 2022-12-09 — End: 2022-12-09
  Administered 2022-12-09: 3 mL/kg/h via INTRAVENOUS

## 2022-12-09 MED ORDER — HEPARIN (PORCINE) IN NACL 1000-0.9 UT/500ML-% IV SOLN
INTRAVENOUS | Status: DC | PRN
  Administered 2022-12-09 (×2): 500 mL

## 2022-12-09 MED ORDER — FENTANYL CITRATE (PF) 100 MCG/2ML IJ SOLN
INTRAMUSCULAR | Status: AC
  Filled 2022-12-09: qty 2

## 2022-12-09 MED ORDER — LABETALOL HCL 5 MG/ML IV SOLN
10.0000 mg | INTRAVENOUS | Status: AC | PRN
Start: 2022-12-09 — End: 2022-12-09

## 2022-12-09 MED ORDER — SODIUM CHLORIDE 0.9 % WEIGHT BASED INFUSION
1.0000 mL/kg/h | INTRAVENOUS | Status: DC
Start: 2022-12-09 — End: 2022-12-09

## 2022-12-09 MED ORDER — SODIUM CHLORIDE 0.9 % WEIGHT BASED INFUSION: 1.0000 mL/kg/h | INTRAVENOUS | Status: DC

## 2022-12-09 SURGICAL SUPPLY — 9 items
CATH INFINITI 5FR ANG PIGTAIL (CATHETERS) IMPLANT
CATH INFINITI AMBI 6FR TG (CATHETERS) IMPLANT
CATH INFINITI JR4 5F (CATHETERS) IMPLANT
DEVICE RAD COMP TR BAND LRG (VASCULAR PRODUCTS) IMPLANT
GLIDESHEATH SLEND SS 6F .021 (SHEATH) IMPLANT
GUIDEWIRE TIGER .035X300 (WIRE) IMPLANT
PACK CARDIAC CATHETERIZATION (CUSTOM PROCEDURE TRAY) ×1 IMPLANT
SET ATX-X65L (MISCELLANEOUS) IMPLANT
WIRE EMERALD 3MM-J .035X260CM (WIRE) IMPLANT

## 2022-12-09 NOTE — TOC Progression Note (Signed)
Transition of Care [facility]) - Progression Note    Patient Details  Name: Nicholas Douglas MRN: [id number] Date of Birth: 30-Nov-1931  Transition of Care [facility]) CM/SW Contact  Eduard Roux, [place] Phone Number: 12/09/2022, 9:58 AM  Clinical Narrative:      Transition of Care [facility]) Screening Note   Patient Details  Name: Nicholas Douglas Date of Birth: 05-21-1931   Transition of Care [facility]) CM/SW Contact:    Eduard Roux, LCSW Phone Number: 12/09/2022, 9:58 AM    Transition of Care Department [facility]) has reviewed patient and no TOC needs have been identified at this time. We will continue to monitor patient advancement through interdisciplinary progression rounds. If new patient transition needs arise, please place a TOC consult.         Expected Discharge Plan and Services                                               Social Determinants of Health (SDOH) Interventions SDOH Screenings   Food Insecurity: No Food Insecurity (12/08/2022)  Housing: Low Risk  (12/08/2022)  Transportation Needs: No Transportation Needs (12/05/2022)   Received from [facility]  Utilities: Not At Risk (12/08/2022)  Depression (PHQ2-9): Low Risk  (01/04/2020)  Financial Resource Strain: Low Risk  (12/05/2022)   Received from [facility]  Tobacco Use: Medium Risk (12/07/2022)    Readmission Risk Interventions     No data to display

## 2022-12-09 NOTE — Plan of Care (Signed)
  Problem: Clinical Measurements: Goal: Ability to maintain clinical measurements within normal limits will improve Outcome: Progressing Goal: Will remain free from infection Outcome: Progressing   Problem: Activity: Goal: Risk for activity intolerance will decrease Outcome: Progressing   

## 2022-12-09 NOTE — Progress Notes (Addendum)
   12/09/22 1642  Vitals  BP 125/74  Pulse Rate 82  ECG Heart Rate 78  MEWS COLOR  MEWS Score Color Green  Oxygen Therapy  SpO2 96 %  Pain Assessment  Pain Scale 0-10  Pain Score 0  MEWS Score  MEWS Temp 0  MEWS Systolic 0  MEWS Pulse 0  MEWS RR 0  MEWS LOC 0  MEWS Score 0   Patient TR band has been deflated at [employer] since 1530. Site is clean, minimal drainage with no signs of hematoma. RN removed TR band and placed gauze and tegaderm. Patient has 0/10 pain at site. RN educated patient and patient's wife on post TR band site care. Patient and patient's wife acknowledge understanding.

## 2022-12-09 NOTE — Progress Notes (Signed)
Patient Name: Nicholas Douglas Date of Encounter: 12/09/2022 [facility] Cardiologist: Nicki Guadalajara, MD   Interval Summary  .    No chest pain overnight, planned for cardiac cath today.   Vital Signs .    Vitals:   12/09/22 0106 12/09/22 0350 12/09/22 0722 12/09/22 0810  BP: 118/77 (!) 145/86 (!) 172/84 (!) 159/76  Pulse: 71 72 76 72  Resp: 18 18 18 16   Temp: 97.6 F (36.4 C) (!) 97.4 F (36.3 C) (!) 97.5 F (36.4 C)   TempSrc: Oral Oral Oral   SpO2: 96% 95% 98% 94%  Weight:  83.3 kg    Height:        Intake/Output Summary (Last 24 hours) at 12/09/2022 1037 Last data filed at 12/09/2022 0838 Gross per 24 hour  Intake 714 ml  Output 300 ml  Net 414 ml      12/09/2022    3:50 AM 12/08/2022    2:12 AM 12/07/2022    7:15 PM  Last 3 Weights  Weight (lbs) 183 lb 11.2 oz 186 lb 3.2 oz 188 lb  Weight (kg) 83.326 kg 84.46 kg 85.276 kg      Telemetry/ECG    Sinus Rhythm - Personally Reviewed  Physical Exam .   GEN: No acute distress.   Neck: No JVD Cardiac: RRR, no murmurs, rubs, or gallops.  Respiratory: Clear to auscultation bilaterally. GI: Soft, nontender, non-distended  MS: No edema  Assessment & Plan .     89 y.o. male with a history of CAD s/p multiple PCIs dating back to 58 (most recently in 05/2015), chronic diastolic CHF, hypertension, hyperlipidemia, renal cancer s/p nephrectomy in 2006 with CKD stage IIIa, and  BPH  who was seen 12/08/2022 for the evaluation of chest pain at the request of Dr. Renford Dills.   Chest Pain CAD -- Patient has a long history of CAD s/p multiple PCIs dating back to 62. Last PCI was a PTCA in to distal RCA in 05/2015. Last cardiac catheterization in 12/2018 showed widely patent stents to the LAD and LCX, 70% stenosis of a small acute marginal branch, and otherwise only mild to moderate non-obstructive disease. There was no significant changes from prior catheterization in 2018 and continued medical therapy was recommended. --  He he presented to the ED on 11/1 and then again on 11/2 for evaluation of chest tightness, pain in between shoulder blades, shortness of breath and significant fatigue. -- hsTn negative x2 -- given concerning symptoms, planned for cardiac cath today -- continue ASA, plavix, Imdur to 60mg  twice daily (increased on admission) Ranexa 1,000mg  twice daily (increased on admission), coreg 3.125mg  BID, statin and Zetia   Chronic Diastolic CHF -- Recent Echo in 08/2022 at [facility] showed LVEF >55 with normal wall motion and grade 1 diastolic dysfunction. Patient does report worsening dyspnea on exertion over the last month or so but no other signs or symptoms of CHF. BNP 74. No edema on chest x-ray. He was given 1 dose of IV Lasix 20mg  in the ED with some improvement in symptoms.  -- echo 11/3 with LVEF of 55-60%, mild LVH, g1dd   Hypertensive  -- BP markedly elevated on arrival at 211/93. Improved today -- Imdur was increased to 60mg  twice daily (on admission), Coreg 3.125mg  twice daily, Amlodipine 5mg  daily   Hyperlipidemia -- Lipid panel in 08/2022: Total Cholesterol 167, Triglycerides 95, HDL 69.4. LDL 79. LDL goal <55.  -- No longer on high-intensity statins due to transient LFT  elevation and concern for dementia.  -- Continue Livalo 4mg  daily, Zetia 10mg  daily, and Vascepa 2g twice daily.  -- can consider adding Nexlotol or PCSK9 inhibitor as an outpatient.   CKD Stage IIIa -- Patient has a solitary kidney following nephrectomy in 2006 for renal cancer. Baseline creatinine around 1.2 to 1.3.  -- stable this morning  For questions or updates, please contact [facility] Please consult [email or web address] for contact info under        Signed, Laverda Page, NP

## 2022-12-09 NOTE — Progress Notes (Addendum)
   12/09/22 1136  Vitals  BP (!) 171/81  MAP (mmHg) 109  ECG Heart Rate 74  MEWS COLOR  MEWS Score Color Green  MEWS Score  MEWS Temp 0  MEWS Systolic 0  MEWS Pulse 0  MEWS RR 0  MEWS LOC 0  MEWS Score 0   NT notified RN patient's BP, obtained twice. RN administered prn IV labetalol as ordered. RN will reassess BP in 30 mins. Patient is NPO for LHC.

## 2022-12-09 NOTE — Plan of Care (Signed)
  Problem: Education: Goal: Knowledge of General Education information will improve Description: Including pain rating scale, medication(s)/side effects and non-pharmacologic comfort measures Outcome: Progressing   Problem: Clinical Measurements: Goal: Ability to maintain clinical measurements within normal limits will improve Outcome: Progressing   

## 2022-12-09 NOTE — Progress Notes (Signed)
PROGRESS NOTE  Nicholas Douglas  VZD:[id number] DOB: Dec 27, 1931 DOA: 12/07/2022 PCP: Danella Penton, MD   Brief Narrative: Patient is 89 year old male with history of coronary artery disease,HTN,SCC of vocal cord, several cardiac cath the last 1 was in November 2020 presented with chest pain, generalized weakness.  He initially went to [facility], troponins were checked, they were negative and was sent home but he continued to have chest pain.  Nitroglycerin helped to some extent but not entirely.  Patient admitted for chest pain rule out. Cardiology consulted as per family request.  Underwent cardiac cath today with finding of largely unchanged coronary artery disease.  Hospital course remarkable for persistent hypertension.  Plan for discharge to home tomorrow after optimizing blood pressure medications  Assessment & Plan:  Principal Problem:   Chest pain    Chest pain: Likely atypical chest pain.  But patient has history of significant coronary artery disease.  Case was discussed with cardiology, recommended nuclear stress test.  Due to family request, cardiology consulted.  Troponins have been normal. Uptitrated Ranexa, atenolol changed to Coreg. Underwent cardiac cath today with finding of largely unchanged coronary artery disease.    Hypertension: Currently on : Norvasc, Imdur, Coreg.  Blood pressure remains soft.  Plan for further titration  of these medications   Hyperlipidemia: On Zetia,vascepa  CKD stage II: Baseline creatinine of 1.2.  Currently kidney function at baseline.         DVT prophylaxis:enoxaparin (LOVENOX) injection 40 mg Start: 12/08/22 1000     Code Status: Full Code  Family Communication: Wife at bedside  Patient status:Inpatient  Patient is from :home  Anticipated discharge IR:[facility]  Estimated DC date:tomorrow   Consultants: cardiology  Procedures:Cath  Antimicrobials:  Anti-infectives (From admission, onward)    Start     Dose/Rate  Route Frequency Ordered Stop   12/08/22 1000  azithromycin (ZITHROMAX) tablet 250 mg        250 mg Oral Daily 12/08/22 0016 12/10/22 0959       Subjective: Patient seen and examined the bedside today.  Hemodynamically stable.  Comfortable, lying in bed.  Denies any chest pain today.  Waiting for cardiac cath.  Objective: Vitals:   12/09/22 1352 12/09/22 1357 12/09/22 1402 12/09/22 1407  BP: 133/63 (!) 148/84 (!) 159/79 (!) 163/86  Pulse: 90 88 88 (!) 0  Resp: 18 20 18    Temp:      TempSrc:      SpO2: 98% 98% 97% 96%  Weight:      Height:        Intake/Output Summary (Last 24 hours) at 12/09/2022 1424 Last data filed at 12/09/2022 1115 Gross per 24 hour  Intake 477 ml  Output 550 ml  Net -73 ml   Filed Weights   12/07/22 1915 12/08/22 0212 12/09/22 0350  Weight: 85.3 kg 84.5 kg 83.3 kg    Examination:  General exam: Overall comfortable, not in distress HEENT: PERRL Respiratory system:  no wheezes or crackles  Cardiovascular system: S1 & S2 heard, RRR.  Gastrointestinal system: Abdomen is nondistended, soft and nontender. Central nervous system: Alert and oriented Extremities: No edema, no clubbing ,no cyanosis Skin: No rashes, no ulcers,no icterus     Data Reviewed: I have personally reviewed following labs and imaging studies  CBC: Recent Labs  Lab 12/06/22 2049 12/07/22 1924 12/08/22 0129  WBC 9.2 7.2 8.6  HGB 15.8 16.1 15.2  HCT 47.3 48.2 47.0  MCV 89.6 89.3 89.5  PLT 257  264 236   Basic Metabolic Panel: Recent Labs  Lab 12/06/22 2049 12/07/22 1924 12/08/22 0129  NA 136 136 138  K 4.2 4.2 4.0  CL 102 102 102  CO2 26 24 28   GLUCOSE 128* 117* 125*  BUN 21 19 18   CREATININE 1.23 1.26* 1.26*  CALCIUM 9.3 9.4 9.1  MG  --   --  2.3     No results found for this or any previous visit (from the past 240 hour(s)).   Radiology Studies: CARDIAC CATHETERIZATION  Result Date: 12/09/2022   Mid LM to Mid LAD lesion is 10% stenosed.   Mid LAD to  Dist LAD lesion is 20% stenosed.   Prox Cx to Mid Cx lesion is 5% stenosed.   Ost Cx to Prox Cx lesion is 20% stenosed.   Ost RCA to Mid RCA lesion is 20% stenosed.   Dist RCA lesion is 30% stenosed.   2nd Diag lesion is 90% stenosed.   1st Mrg lesion is 40% stenosed.   2nd Mrg lesion is 60% stenosed.   Acute Mrg lesion is 90% stenosed.   RPAV lesion is 20% stenosed.   RPDA lesion is 60% stenosed.   1st Diag lesion is 90% stenosed. 1.  Relatively unchanged burden of disease with patent LAD and left circumflex stents with mild to moderate diffuse disease elsewhere.  There is some progression of disease in the ostium of the first and second diagonal and RV marginal branches but these are small vessels and should be treated medically. 2.  LVEDP of 14 to 16 mmHg. Summary: The images were reviewed with Dr. Rosemary Holms and medical therapy will be pursued.   ECHOCARDIOGRAM COMPLETE  Result Date: 12/08/2022    ECHOCARDIOGRAM REPORT   [name] Date of Exam: 12/08/2022 Medical Rec #:  [id number]    Height:       67.0 in Accession #:    [id number]   Weight:       186.2 lb Date of Birth:  1931/07/22    BSA:          1.962 m Patient Age:    89 years     BP:           160/73 mmHg Patient Gender: M            HR:           58 bpm. Exam Location:  Inpatient Procedure: 2D Echo, Cardiac Doppler, Color Doppler and Intracardiac            Opacification Agent Indications:    Chest Pain R07.9  History:        Patient has prior history of Echocardiogram examinations, most                 recent 07/18/2021. CAD and Angina, Signs/Symptoms:Chest Pain and                 Dyspnea; Risk Factors:Hypertension, Dyslipidemia, Sleep Apnea                 and Current Smoker. CKD, stage 3.  Sonographer:    Lucendia Herrlich RCS Referring Phys: [id number] CALLIE E GOODRICH IMPRESSIONS  1. Images are limited.  2. Left ventricular ejection fraction, by estimation, is 55 to 60%. The left ventricle has normal function. The left ventricle  has no regional wall motion abnormalities. There is mild concentric left ventricular hypertrophy. Left ventricular diastolic parameters are consistent with Grade I diastolic dysfunction (  impaired relaxation).  3. Right ventricular systolic function is normal. The right ventricular size is normal. There is normal pulmonary artery systolic pressure. The estimated right ventricular systolic pressure is 14.7 mmHg.  4. The mitral valve is degenerative. Trivial mitral valve regurgitation.  5. The inferior vena cava is dilated in size with >50% respiratory variability, suggesting right atrial pressure of 8 mmHg.  6. The aortic valve was not well visualized. There is mild calcification of the aortic valve. Aortic valve regurgitation is not visualized. Comparison(s): Prior images reviewed side by side. LVEF normal range at 55-60%. FINDINGS  Left Ventricle: Left ventricular ejection fraction, by estimation, is 55 to 60%. The left ventricle has normal function. The left ventricle has no regional wall motion abnormalities. Definity contrast agent was given IV to delineate the left ventricular  endocardial borders. The left ventricular internal cavity size was normal in size. There is mild concentric left ventricular hypertrophy. Left ventricular diastolic parameters are consistent with Grade I diastolic dysfunction (impaired relaxation). Right Ventricle: The right ventricular size is normal. Right vetricular wall thickness was not well visualized. Right ventricular systolic function is normal. There is normal pulmonary artery systolic pressure. The tricuspid regurgitant velocity is 1.29 m/s, and with an assumed right atrial pressure of 8 mmHg, the estimated right ventricular systolic pressure is 14.7 mmHg. Left Atrium: Left atrial size was normal in size. Right Atrium: Right atrial size was normal in size. Pericardium: There is no evidence of pericardial effusion. Mitral Valve: The mitral valve is degenerative in appearance.  Mild mitral annular calcification. Trivial mitral valve regurgitation. Tricuspid Valve: The tricuspid valve is not well visualized. Tricuspid valve regurgitation is trivial. Aortic Valve: The aortic valve was not well visualized. There is mild calcification of the aortic valve. There is moderate aortic valve annular calcification. Aortic valve regurgitation is not visualized. Aortic valve peak gradient measures 9.2 mmHg. Pulmonic Valve: The pulmonic valve was not well visualized. Pulmonic valve regurgitation is trivial. Aorta: The aortic root and ascending aorta are structurally normal, with no evidence of dilitation. Venous: The inferior vena cava is dilated in size with greater than 50% respiratory variability, suggesting right atrial pressure of 8 mmHg. IAS/Shunts: The interatrial septum was not well visualized.  LEFT VENTRICLE PLAX 2D LVOT diam:     2.00 cm   Diastology LV SV:         60        LV e' medial:    7.07 cm/s LV SV Index:   31        LV E/e' medial:  7.5 LVOT Area:     3.14 cm  LV e' lateral:   7.29 cm/s                          LV E/e' lateral: 7.3  RIGHT VENTRICLE            IVC RV S prime:     8.81 cm/s  IVC diam: 2.30 cm TAPSE (M-mode): 0.9 cm LEFT ATRIUM             Index        RIGHT ATRIUM          Index LA Vol (A2C):   46.7 ml 23.81 ml/m  RA Area:     9.25 cm LA Vol (A4C):   44.1 ml 22.45 ml/m  RA Volume:   15.20 ml 7.75 ml/m LA Biplane Vol: 47.0 ml 23.96 ml/m  AORTIC VALVE AV  Area (Vmax): 1.93 cm AV Vmax:        152.00 cm/s AV Peak Grad:   9.2 mmHg LVOT Vmax:      93.37 cm/s LVOT Vmean:     59.267 cm/s LVOT VTI:       0.192 m  AORTA Ao Root diam: 3.00 cm Ao Asc diam:  3.00 cm MITRAL VALVE               TRICUSPID VALVE MV Area (PHT): 2.37 cm    TR Peak grad:   6.7 mmHg MV Decel Time: 320 msec    TR Vmax:        129.00 cm/s MR Peak grad: 19.4 mmHg MR Vmax:      220.00 cm/s  SHUNTS MV E velocity: 53.30 cm/s  Systemic VTI:  0.19 m MV A velocity: 93.20 cm/s  Systemic Diam: 2.00 cm MV E/A  ratio:  0.57 Nona Dell MD Electronically signed by Nona Dell MD Signature Date/Time: 12/08/2022/3:56:23 PM    Final    DG Chest Portable 1 View  Result Date: 12/07/2022 CLINICAL DATA:  Acute coronary syndrome. Chest pain radiating to the back and shoulder. EXAM: PORTABLE CHEST 1 VIEW COMPARISON:  Chest radiograph dated 12/06/2022. FINDINGS: Shallow inspiration. No focal consolidation, pleural effusion, or pneumothorax. The cardiac silhouette is within normal limits. No acute osseous pathology. IMPRESSION: No active disease. Electronically Signed   By: Elgie Collard M.D.   On: 12/07/2022 21:08    Scheduled Meds:  amLODipine  5 mg Oral QHS   aspirin EC  81 mg Oral QPM   azithromycin  250 mg Oral Daily   carvedilol  3.125 mg Oral BID WC   clopidogrel  75 mg Oral Daily   enoxaparin (LOVENOX) injection  40 mg Subcutaneous Daily   ezetimibe  10 mg Oral Daily   finasteride  5 mg Oral Daily   icosapent Ethyl  1 g Oral BID   isosorbide mononitrate  60 mg Oral BID   nitroGLYCERIN       ranolazine  1,000 mg Oral Q supper   Continuous Infusions:   LOS: 1 day   Burnadette Pop, MD [facility] P11/05/2022, 2:24 PM

## 2022-12-09 NOTE — Interval H&P Note (Signed)
History and Physical Interval Note:  12/09/2022 10:09 AM  Nicholas Douglas  has presented today for surgery, with the diagnosis of unstable angina.  The various methods of treatment have been discussed with the patient and family. After consideration of risks, benefits and other options for treatment, the patient has consented to  Procedure(s): LEFT HEART CATH AND CORONARY ANGIOGRAPHY (N/A) as a surgical intervention.  The patient's history has been reviewed, patient examined, no change in status, stable for surgery.  I have reviewed the patient's chart and labs.  Questions were answered to the patient's satisfaction.     [name]

## 2022-12-10 ENCOUNTER — Encounter (HOSPITAL_COMMUNITY): Payer: Self-pay | Admitting: Internal Medicine

## 2022-12-10 DIAGNOSIS — I10 Essential (primary) hypertension: Secondary | ICD-10-CM | POA: Diagnosis not present

## 2022-12-10 DIAGNOSIS — I2089 Other forms of angina pectoris: Secondary | ICD-10-CM | POA: Diagnosis not present

## 2022-12-10 DIAGNOSIS — R072 Precordial pain: Secondary | ICD-10-CM | POA: Diagnosis not present

## 2022-12-10 MED ORDER — ISOSORBIDE MONONITRATE ER 60 MG PO TB24: 60.0000 mg | ORAL_TABLET | Freq: Two times a day (BID) | ORAL | 1 refills | Status: DC

## 2022-12-10 MED ORDER — CARVEDILOL 6.25 MG PO TABS: 6.2500 mg | ORAL_TABLET | Freq: Two times a day (BID) | ORAL | Status: DC

## 2022-12-10 MED ORDER — CARVEDILOL 6.25 MG PO TABS: 6.2500 mg | ORAL_TABLET | Freq: Two times a day (BID) | ORAL | 1 refills | Status: DC

## 2022-12-10 MED ORDER — RANOLAZINE ER 1000 MG PO TB12: 1000.0000 mg | ORAL_TABLET | Freq: Every day | ORAL | 1 refills | Status: DC

## 2022-12-10 MED ORDER — CARVEDILOL 3.125 MG PO TABS: 3.1250 mg | ORAL_TABLET | Freq: Once | ORAL | Status: DC

## 2022-12-10 MED ORDER — AMLODIPINE BESYLATE 10 MG PO TABS: 10.0000 mg | ORAL_TABLET | Freq: Every day | ORAL | Status: DC

## 2022-12-10 MED ORDER — AMLODIPINE BESYLATE 10 MG PO TABS: 10.0000 mg | ORAL_TABLET | Freq: Every day | ORAL | 1 refills | Status: AC

## 2022-12-10 NOTE — Plan of Care (Signed)
  Problem: Education: Goal: Knowledge of General Education information will improve Description: Including pain rating scale, medication(s)/side effects and non-pharmacologic comfort measures Outcome: Adequate for Discharge   Problem: Health Behavior/Discharge Planning: Goal: Ability to manage health-related needs will improve Outcome: Adequate for Discharge   Problem: Clinical Measurements: Goal: Ability to maintain clinical measurements within normal limits will improve Outcome: Adequate for Discharge Goal: Will remain free from infection Outcome: Adequate for Discharge Goal: Diagnostic test results will improve Outcome: Adequate for Discharge Goal: Respiratory complications will improve Outcome: Adequate for Discharge Goal: Cardiovascular complication will be avoided Outcome: Adequate for Discharge   Problem: Activity: Goal: Risk for activity intolerance will decrease Outcome: Adequate for Discharge   Problem: Nutrition: Goal: Adequate nutrition will be maintained Outcome: Adequate for Discharge   Problem: Coping: Goal: Level of anxiety will decrease Outcome: Adequate for Discharge   Problem: Elimination: Goal: Will not experience complications related to bowel motility Outcome: Adequate for Discharge Goal: Will not experience complications related to urinary retention Outcome: Adequate for Discharge   Problem: Pain Management: Goal: General experience of comfort will improve Outcome: Adequate for Discharge   Problem: Safety: Goal: Ability to remain free from injury will improve Outcome: Adequate for Discharge   Problem: Skin Integrity: Goal: Risk for impaired skin integrity will decrease Outcome: Adequate for Discharge   Problem: Education: Goal: Understanding of CV disease, CV risk reduction, and recovery process will improve Outcome: Adequate for Discharge Goal: Individualized Educational Video(s) Outcome: Adequate for Discharge   Problem:  Activity: Goal: Ability to return to baseline activity level will improve Outcome: Adequate for Discharge   Problem: Cardiovascular: Goal: Ability to achieve and maintain adequate cardiovascular perfusion will improve Outcome: Adequate for Discharge Goal: Vascular access site(s) Level 0-1 will be maintained Outcome: Adequate for Discharge   Problem: Health Behavior/Discharge Planning: Goal: Ability to safely manage health-related needs after discharge will improve Outcome: Adequate for Discharge

## 2022-12-10 NOTE — TOC Initial Note (Signed)
Transition of Care [facility]) - Initial/Assessment Note    Patient Details  Name: Nicholas Douglas MRN: [id number] Date of Birth: 1931-06-29  Transition of Care [facility]) CM/SW Contact:    Leone Haven, RN Phone Number: 12/10/2022, 11:04 AM  Clinical Narrative:                 From home with spouse, has PCP and insurance on file, states has no HH services in place at this time or DME at home.  States family member will transport them home at [employer] and family is support system, .  Pta self ambulatory.   Expected Discharge Plan: Home/Self Care Barriers to Discharge: No Barriers Identified   Patient Goals and CMS Choice Patient states their goals for this hospitalization and ongoing recovery are:: home   Choice offered to / list presented to : NA      Expected Discharge Plan and Services In-house Referral: NA Discharge Planning Services: CM Consult Post Acute Care Choice: NA Living arrangements for the past 2 months: Single Family Home Expected Discharge Date: 12/10/22               DME Arranged: N/A DME Agency: NA         HH Agency: NA        Prior Living Arrangements/Services Living arrangements for the past 2 months: Single Family Home Lives with:: Spouse Patient language and need for interpreter reviewed:: Yes Do you feel safe going back to the place where you live?: Yes      Need for Family Participation in Patient Care: Yes (Comment) Care giver support system in place?: Yes (comment)   Criminal Activity/Legal Involvement Pertinent to Current Situation/Hospitalization: No - Comment as needed  Activities of Daily Living   ADL Screening (condition at time of admission) Independently performs ADLs?: No Does the patient have a NEW difficulty with bathing/dressing/toileting/self-feeding that is expected to last >3 days?: No Does the patient have a NEW difficulty with getting in/out of bed, walking, or climbing stairs that is expected to last >3 days?: No Does the  patient have a NEW difficulty with communication that is expected to last >3 days?: No Is the patient deaf or have difficulty hearing?: No Does the patient have difficulty seeing, even when wearing glasses/contacts?: No Does the patient have difficulty concentrating, remembering, or making decisions?: No  Permission Sought/Granted Permission sought to share information with : Case Manager Permission granted to share information with : Yes, Verbal Permission Granted              Emotional Assessment   Attitude/Demeanor/Rapport: Engaged Affect (typically observed): Appropriate Orientation: : Oriented to Self, Oriented to Place, Oriented to  Time, Oriented to Situation Alcohol / Substance Use: Not Applicable Psych Involvement: No (comment)  Admission diagnosis:  Chest pain [R07.9] Chest pain, unspecified type [R07.9] Patient Active Problem List   Diagnosis Date Noted   Stable angina (HCC)    Chest pain 07/17/2021   Solitary kidney, acquired 07/17/2021   AKI (acute kidney injury) (HCC) 07/17/2021   Elevated glucose 07/17/2021   Suspected sleep apnea 07/17/2021   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   Goals of care, counseling/discussion 04/16/2020   CAD S/P PTCA only of RPAV-PL 05/31/2015   Chronic kidney disease, stage III (moderate) (HCC) 05/29/2015   Exertional dyspnea 05/11/2015   Melanoma in situ of back (HCC) 04/20/2015   Abdominal aortic aneurysm (AAA) (HCC) 01/12/2015   Bronchitis 05/14/2013   S/P percutaneous angioplasty of renal  artery 04/27/2013   Unstable angina (HCC) 04/05/2013   S/P appendectomy 11/20/2012   Need for influenza vaccination 12/03/2011   Routine general medical examination at a health care facility 12/03/2011   Essential hypertension, benign 12/03/2011   Coronary artery disease involving native coronary artery of native heart with angina pectoris (HCC) 12/03/2011   Renal cell carcinoma (HCC) 12/03/2011   Hyperlipidemia 12/03/2011    Colon polyps 12/03/2011   Dizziness 12/03/2011   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 [place] [address] Phone: [phone] Fax: [phone]  [facility]) - [place], [place] - [address] [facility] [address] [place] [address] Phone: [phone] Fax: [phone]  [facility] - [place], [place] - [address] [place] [address] Phone: [phone] Fax: [phone]     Social Determinants of Health (SDOH) Social History: SDOH Screenings   Food Insecurity: No Food Insecurity (12/08/2022)  Housing: Low Risk  (12/08/2022)  Transportation Needs: No Transportation Needs (12/10/2022)  Utilities: Not At Risk (12/08/2022)  Depression (PHQ2-9): Low Risk  (01/04/2020)  Financial Resource Strain: Low Risk  (12/05/2022)   Received from [facility]  Tobacco Use: Medium Risk (12/07/2022)   SDOH Interventions:     Readmission Risk Interventions     No data to display

## 2022-12-10 NOTE — Discharge Summary (Signed)
Physician Discharge Summary  MAHIR PRABHAKAR Douglas:[id number] DOB: [birth year] DOA: 12/07/2022  PCP: Nicholas Penton, MD  Admit date: 12/07/2022 Discharge date: 12/10/2022  Admitted From: Home Disposition:  Home  Discharge Condition:Stable CODE STATUS:FULL Diet recommendation: Heart Healthy  Brief/Interim Summary: Patient is 89 year old male with history of coronary artery disease,HTN,SCC of vocal cord, several cardiac cath the last 1 was in November 2020 presented with chest pain, generalized weakness.  He initially went to [facility], troponins were checked, they were negative and was sent home but he continued to have chest pain.  Nitroglycerin helped to some extent but not entirely.  Patient admitted for chest pain rule out. Cardiology consulted as per family request.  Underwent cardiac cath t with finding of largely unchanged coronary artery disease.  Hospital course remarkable for persistent hypertension.  Blood pressure medications optimized.  Cardiology cleared for discharge.  Plan for discharge home today.  Following problems were addressed during the hospitalization:  Chest pain: Likely atypical chest pain.  But patient has history of significant coronary artery disease.  Case was discussed with cardiology, recommended nuclear stress test.  Due to family request, cardiology consulted.  Troponins have been normal. Uptitrated Ranexa, atenolol changed to Coreg. Underwent cardiac cath with finding of largely unchanged coronary artery disease.  No chest pain today.   Hypertension: Currently on : Norvasc, Imdur, Coreg.   Blood pressure was high during this hospitalization, medication dose optimized.  Hyperlipidemia: On Zetia,vascepa   CKD stage II: Baseline creatinine of 1.2.  Currently kidney function at baseline.   Discharge Diagnoses:  Principal Problem:   Chest pain    Discharge Instructions  Discharge Instructions     Diet - low sodium heart healthy   Complete by: As  directed    Discharge instructions   Complete by: As directed    1)Please take prescribed medications as instructed 2)Follow up with your PCP and cardiologist as an outpatient.   Increase activity slowly   Complete by: As directed       Allergies as of 12/10/2022       Reactions   Angiotensin Receptor Blockers    Other reaction(s): Kidney Disorder Hyperkalemia   Lovastatin Other (See Comments)   Elevated liver enzymes   Morphine Hives   Nifedipine Other (See Comments)   Elevated liver enzymes        Medication List     STOP taking these medications    atenolol 25 MG tablet Commonly known as: TENORMIN   azithromycin 250 MG tablet Commonly known as: ZITHROMAX       TAKE these medications    acetaminophen 500 MG tablet Commonly known as: TYLENOL Take 500 mg by mouth every 6 (six) hours as needed (for pain.).   amLODipine 10 MG tablet Commonly known as: NORVASC Take 1 tablet (10 mg total) by mouth at bedtime. What changed: when to take this   aspirin 81 MG tablet Take 81 mg by mouth every evening.   carboxymethylcellulose 0.5 % Soln Commonly known as: REFRESH PLUS Place 1 drop into both eyes daily as needed.   carvedilol 6.25 MG tablet Commonly known as: COREG Take 1 tablet (6.25 mg total) by mouth 2 (two) times daily with a meal.   clopidogrel 75 MG tablet Commonly known as: PLAVIX TAKE 1 TABLET BY MOUTH DAILY   ezetimibe 10 MG tablet Commonly known as: ZETIA TAKE 1 TABLET BY MOUTH DAILY   finasteride 5 MG tablet Commonly known as: PROSCAR Take 1 tablet (5 mg  total) by mouth daily. What changed: how much to take   furosemide 20 MG tablet Commonly known as: LASIX TAKE 1 TABLET BY MOUTH TWICE  WEEKLY What changed: See the new instructions.   icosapent Ethyl 1 g capsule Commonly known as: VASCEPA Take 1 capsule (1 g total) by mouth 2 (two) times daily.   isosorbide mononitrate 60 MG 24 hr tablet Commonly known as: IMDUR Take 1 tablet (60  mg total) by mouth 2 (two) times daily. What changed: See the new instructions.   Livalo 4 MG Tabs Generic drug: Pitavastatin Calcium TAKE 1 TABLET BY MOUTH IN THE  EVENING   nitroGLYCERIN 0.4 MG SL tablet Commonly known as: NITROSTAT PLACE 1 TABLET UNDER THE TONGUE EVERY 5 MINUTES AS NEEDED FOR CHEST PAIN.   predniSONE 10 MG tablet Commonly known as: DELTASONE Take 10 mg by mouth daily with breakfast.   ranolazine 1000 MG SR tablet Commonly known as: RANEXA Take 1 tablet (1,000 mg total) by mouth daily with supper. What changed:  medication strength how much to take when to take this   Theraworx Relief Foam Apply 1 Application topically daily as needed (Leg Cramps).        Follow-up Information     Nicholas Penton, MD. Go on 12/16/2022.   Specialty: Internal Medicine Why: @11 :15am Contact information: [address] [facility] [place] [address] [phone]                Allergies  Allergen Reactions   Angiotensin Receptor Blockers     Other reaction(s): Kidney Disorder Hyperkalemia   Lovastatin Other (See Comments)    Elevated liver enzymes   Morphine Hives   Nifedipine Other (See Comments)    Elevated liver enzymes    Consultations: Cardiology   Procedures/Studies: CARDIAC CATHETERIZATION  Result Date: 12/09/2022   Mid LM to Mid LAD lesion is 10% stenosed.   Mid LAD to Dist LAD lesion is 20% stenosed.   Prox Cx to Mid Cx lesion is 5% stenosed.   Ost Cx to Prox Cx lesion is 20% stenosed.   Ost RCA to Mid RCA lesion is 20% stenosed.   Dist RCA lesion is 30% stenosed.   2nd Diag lesion is 90% stenosed.   1st Mrg lesion is 40% stenosed.   2nd Mrg lesion is 60% stenosed.   Acute Mrg lesion is 90% stenosed.   RPAV lesion is 20% stenosed.   RPDA lesion is 60% stenosed.   1st Diag lesion is 90% stenosed. 1.  Relatively unchanged burden of disease with patent LAD and left circumflex stents with mild to moderate diffuse  disease elsewhere.  There is some progression of disease in the ostium of the first and second diagonal and RV marginal branches but these are small vessels and should be treated medically. 2.  LVEDP of 14 to 16 mmHg. Summary: The images were reviewed with Dr. Rosemary Holms and medical therapy will be pursued.   ECHOCARDIOGRAM COMPLETE  Result Date: 12/08/2022    ECHOCARDIOGRAM REPORT   [name] Date of Exam: 12/08/2022 Medical Rec #:  [id number]    Height:       67.0 in Accession #:    [id number]   Weight:       186.2 lb Date of Birth:  23-Feb-1931    BSA:          1.962 m Patient Age:    89 years     BP:  160/73 mmHg Patient Gender: M            HR:           58 bpm. Exam Location:  Inpatient Procedure: 2D Echo, Cardiac Doppler, Color Doppler and Intracardiac            Opacification Agent Indications:    Chest Pain R07.9  History:        Patient has prior history of Echocardiogram examinations, most                 recent 07/18/2021. CAD and Angina, Signs/Symptoms:Chest Pain and                 Dyspnea; Risk Factors:Hypertension, Dyslipidemia, Sleep Apnea                 and Current Smoker. CKD, stage 3.  Sonographer:    Lucendia Herrlich RCS Referring Phys: [id number] CALLIE E GOODRICH IMPRESSIONS  1. Images are limited.  2. Left ventricular ejection fraction, by estimation, is 55 to 60%. The left ventricle has normal function. The left ventricle has no regional wall motion abnormalities. There is mild concentric left ventricular hypertrophy. Left ventricular diastolic parameters are consistent with Grade I diastolic dysfunction (impaired relaxation).  3. Right ventricular systolic function is normal. The right ventricular size is normal. There is normal pulmonary artery systolic pressure. The estimated right ventricular systolic pressure is 14.7 mmHg.  4. The mitral valve is degenerative. Trivial mitral valve regurgitation.  5. The inferior vena cava is dilated in size with >50%  respiratory variability, suggesting right atrial pressure of 8 mmHg.  6. The aortic valve was not well visualized. There is mild calcification of the aortic valve. Aortic valve regurgitation is not visualized. Comparison(s): Prior images reviewed side by side. LVEF normal range at 55-60%. FINDINGS  Left Ventricle: Left ventricular ejection fraction, by estimation, is 55 to 60%. The left ventricle has normal function. The left ventricle has no regional wall motion abnormalities. Definity contrast agent was given IV to delineate the left ventricular  endocardial borders. The left ventricular internal cavity size was normal in size. There is mild concentric left ventricular hypertrophy. Left ventricular diastolic parameters are consistent with Grade I diastolic dysfunction (impaired relaxation). Right Ventricle: The right ventricular size is normal. Right vetricular wall thickness was not well visualized. Right ventricular systolic function is normal. There is normal pulmonary artery systolic pressure. The tricuspid regurgitant velocity is 1.29 m/s, and with an assumed right atrial pressure of 8 mmHg, the estimated right ventricular systolic pressure is 14.7 mmHg. Left Atrium: Left atrial size was normal in size. Right Atrium: Right atrial size was normal in size. Pericardium: There is no evidence of pericardial effusion. Mitral Valve: The mitral valve is degenerative in appearance. Mild mitral annular calcification. Trivial mitral valve regurgitation. Tricuspid Valve: The tricuspid valve is not well visualized. Tricuspid valve regurgitation is trivial. Aortic Valve: The aortic valve was not well visualized. There is mild calcification of the aortic valve. There is moderate aortic valve annular calcification. Aortic valve regurgitation is not visualized. Aortic valve peak gradient measures 9.2 mmHg. Pulmonic Valve: The pulmonic valve was not well visualized. Pulmonic valve regurgitation is trivial. Aorta: The aortic  root and ascending aorta are structurally normal, with no evidence of dilitation. Venous: The inferior vena cava is dilated in size with greater than 50% respiratory variability, suggesting right atrial pressure of 8 mmHg. IAS/Shunts: The interatrial septum was not well visualized.  LEFT VENTRICLE PLAX 2D LVOT  diam:     2.00 cm   Diastology LV SV:         60        LV e' medial:    7.07 cm/s LV SV Index:   31        LV E/e' medial:  7.5 LVOT Area:     3.14 cm  LV e' lateral:   7.29 cm/s                          LV E/e' lateral: 7.3  RIGHT VENTRICLE            IVC RV S prime:     8.81 cm/s  IVC diam: 2.30 cm TAPSE (M-mode): 0.9 cm LEFT ATRIUM             Index        RIGHT ATRIUM          Index LA Vol (A2C):   46.7 ml 23.81 ml/m  RA Area:     9.25 cm LA Vol (A4C):   44.1 ml 22.45 ml/m  RA Volume:   15.20 ml 7.75 ml/m LA Biplane Vol: 47.0 ml 23.96 ml/m  AORTIC VALVE AV Area (Vmax): 1.93 cm AV Vmax:        152.00 cm/s AV Peak Grad:   9.2 mmHg LVOT Vmax:      93.37 cm/s LVOT Vmean:     59.267 cm/s LVOT VTI:       0.192 m  AORTA Ao Root diam: 3.00 cm Ao Asc diam:  3.00 cm MITRAL VALVE               TRICUSPID VALVE MV Area (PHT): 2.37 cm    TR Peak grad:   6.7 mmHg MV Decel Time: 320 msec    TR Vmax:        129.00 cm/s MR Peak grad: 19.4 mmHg MR Vmax:      220.00 cm/s  SHUNTS MV E velocity: 53.30 cm/s  Systemic VTI:  0.19 m MV A velocity: 93.20 cm/s  Systemic Diam: 2.00 cm MV E/A ratio:  0.57 Nona Dell MD Electronically signed by Nona Dell MD Signature Date/Time: 12/08/2022/3:56:23 PM    Final    DG Chest Portable 1 View  Result Date: 12/07/2022 CLINICAL DATA:  Acute coronary syndrome. Chest pain radiating to the back and shoulder. EXAM: PORTABLE CHEST 1 VIEW COMPARISON:  Chest radiograph dated 12/06/2022. FINDINGS: Shallow inspiration. No focal consolidation, pleural effusion, or pneumothorax. The cardiac silhouette is within normal limits. No acute osseous pathology. IMPRESSION: No active disease.  Electronically Signed   By: Elgie Collard M.D.   On: 12/07/2022 21:08   DG Chest 2 View  Result Date: 12/06/2022 CLINICAL DATA:  Chest pain EXAM: CHEST - 2 VIEW COMPARISON:  08/06/2022 FINDINGS: Low lung volumes. Heart and mediastinal contours are within normal limits. No focal opacities or effusions. No acute bony abnormality. IMPRESSION: Low volumes.  No active cardiopulmonary disease. Electronically Signed   By: Charlett Nose M.D.   On: 12/06/2022 22:03      Subjective: Patient seen and examined the bedside today.  Hemodynamically stable.  Eager to go home.  Denies chest pain this morning.  Discharge Exam: Vitals:   12/10/22 0724 12/10/22 0850  BP: (!) 161/78 (!) 161/78  Pulse: 77 77  Resp: 17   Temp: 97.8 F (36.6 C)   SpO2: 95%    Vitals:   12/10/22 0026 12/10/22 0430  12/10/22 0724 12/10/22 0850  BP:  (!) 154/76 (!) 161/78 (!) 161/78  Pulse:  73 77 77  Resp:  18 17   Temp:  97.6 F (36.4 C) 97.8 F (36.6 C)   TempSrc:  Oral Oral   SpO2:  97% 95%   Weight: 84 kg     Height:        General: Pt is alert, awake, not in acute distress, obese Cardiovascular: RRR, S1/S2 +, no rubs, no gallops Respiratory: CTA bilaterally, no wheezing, no rhonchi Abdominal: Soft, NT, ND, bowel sounds + Extremities: no edema, no cyanosis    The results of significant diagnostics from this hospitalization (including imaging, microbiology, ancillary and laboratory) are listed below for reference.     Microbiology: No results found for this or any previous visit (from the past 240 hour(s)).   Labs: BNP (last 3 results) Recent Labs    12/08/22 0129  BNP 74.4   Basic Metabolic Panel: Recent Labs  Lab 12/06/22 2049 12/07/22 1924 12/08/22 0129  NA 136 136 138  K 4.2 4.2 4.0  CL 102 102 102  CO2 26 24 28   GLUCOSE 128* 117* 125*  BUN 21 19 18   CREATININE 1.23 1.26* 1.26*  CALCIUM 9.3 9.4 9.1  MG  --   --  2.3   Liver Function Tests: No results for input(s): "AST", "ALT",  "ALKPHOS", "BILITOT", "PROT", "ALBUMIN" in the last 168 hours. No results for input(s): "LIPASE", "AMYLASE" in the last 168 hours. No results for input(s): "AMMONIA" in the last 168 hours. CBC: Recent Labs  Lab 12/06/22 2049 12/07/22 1924 12/08/22 0129  WBC 9.2 7.2 8.6  HGB 15.8 16.1 15.2  HCT 47.3 48.2 47.0  MCV 89.6 89.3 89.5  PLT 257 264 236   Cardiac Enzymes: No results for input(s): "CKTOTAL", "CKMB", "CKMBINDEX", "TROPONINI" in the last 168 hours. BNP: Invalid input(s): "POCBNP" CBG: No results for input(s): "GLUCAP" in the last 168 hours. D-Dimer Recent Labs    12/07/22 2158  DDIMER 0.27   Hgb A1c No results for input(s): "HGBA1C" in the last 72 hours. Lipid Profile No results for input(s): "CHOL", "HDL", "LDLCALC", "TRIG", "CHOLHDL", "LDLDIRECT" in the last 72 hours. Thyroid function studies No results for input(s): "TSH", "T4TOTAL", "T3FREE", "THYROIDAB" in the last 72 hours.  Invalid input(s): "FREET3" Anemia work up No results for input(s): "VITAMINB12", "FOLATE", "FERRITIN", "TIBC", "IRON", "RETICCTPCT" in the last 72 hours. Urinalysis    Component Value Date/Time   COLORURINE YELLOW (A) 07/18/2021 0800   APPEARANCEUR CLEAR (A) 07/18/2021 0800   APPEARANCEUR Clear 04/03/2013 0054   LABSPEC 1.023 07/18/2021 0800   LABSPEC 1.014 04/03/2013 0054   PHURINE 5.0 07/18/2021 0800   GLUCOSEU NEGATIVE 07/18/2021 0800   GLUCOSEU Negative 04/03/2013 0054   HGBUR NEGATIVE 07/18/2021 0800   BILIRUBINUR NEGATIVE 07/18/2021 0800   BILIRUBINUR neg 12/22/2013 0946   BILIRUBINUR Negative 04/03/2013 0054   KETONESUR NEGATIVE 07/18/2021 0800   PROTEINUR NEGATIVE 07/18/2021 0800   UROBILINOGEN 0.2 12/22/2013 0946   UROBILINOGEN 0.2 01/09/2010 0123   NITRITE NEGATIVE 07/18/2021 0800   LEUKOCYTESUR NEGATIVE 07/18/2021 0800   LEUKOCYTESUR Negative 04/03/2013 0054   Sepsis Labs Recent Labs  Lab 12/06/22 2049 12/07/22 1924 12/08/22 0129  WBC 9.2 7.2 8.6    Microbiology No results found for this or any previous visit (from the past 240 hour(s)).  Please note: You were cared for by a hospitalist during your hospital stay. Once you are discharged, your primary care physician will handle any further  medical issues. Please note that NO REFILLS for any discharge medications will be authorized once you are discharged, as it is imperative that you return to your primary care physician (or establish a relationship with a primary care physician if you do not have one) for your post hospital discharge needs so that they can reassess your need for medications and monitor your lab values.    Time coordinating discharge: 40 minutes  SIGNED:   Burnadette Pop, MD  [facility] 12/10/2022, 10:45 AM Pager [phone]  If 7PM-7AM, please contact night-coverage [email or web address] Password [email or web address]

## 2022-12-10 NOTE — Progress Notes (Signed)
Patient Name: Nicholas Douglas Date of Encounter: 12/10/2022 [facility] Cardiologist: Nicki Guadalajara, MD   Interval Summary  .    No chest pain Wants to go home  Vital Signs .    Vitals:   12/10/22 0026 12/10/22 0430 12/10/22 0724 12/10/22 0850  BP:  (!) 154/76 (!) 161/78 (!) 161/78  Pulse:  73 77 77  Resp:  18 17   Temp:  97.6 F (36.4 C) 97.8 F (36.6 C)   TempSrc:  Oral Oral   SpO2:  97% 95%   Weight: 84 kg     Height:        Intake/Output Summary (Last 24 hours) at 12/10/2022 1022 Last data filed at 12/10/2022 0836 Gross per 24 hour  Intake 600 ml  Output 1075 ml  Net -475 ml      12/10/2022   12:26 AM 12/09/2022    3:50 AM 12/08/2022    2:12 AM  Last 3 Weights  Weight (lbs) 185 lb 3 oz 183 lb 11.2 oz 186 lb 3.2 oz  Weight (kg) 84 kg 83.326 kg 84.46 kg      Telemetry/ECG    12/10/2022- Personally Reviewed No arrhythmia  Coronary angiogram 12/09/2022 (Dr. Lynnette Caffey):   Mid LM to Mid LAD lesion is 10% stenosed.   Mid LAD to Dist LAD lesion is 20% stenosed.   Prox Cx to Mid Cx lesion is 5% stenosed.   Ost Cx to Prox Cx lesion is 20% stenosed.   Ost RCA to Mid RCA lesion is 20% stenosed.   Dist RCA lesion is 30% stenosed.   2nd Diag lesion is 90% stenosed.   1st Mrg lesion is 40% stenosed.   2nd Mrg lesion is 60% stenosed.   Acute Mrg lesion is 90% stenosed.   RPAV lesion is 20% stenosed.   RPDA lesion is 60% stenosed.   1st Diag lesion is 90% stenosed.   1.  Relatively unchanged burden of disease with patent LAD and left circumflex stents with mild to moderate diffuse disease elsewhere.  There is some progression of disease in the ostium of the first and second diagonal and RV marginal branches but these are small vessels and should be treated medically. 2.  LVEDP of 14 to 16 mmHg.   Summary: The images were reviewed with Dr. Rosemary Holms and medical therapy will be pursued.  Physical Exam .   Physical Exam Vitals and nursing note reviewed.   Constitutional:      General: He is not in acute distress. Neck:     Vascular: No JVD.  Cardiovascular:     Rate and Rhythm: Normal rate and regular rhythm.     Heart sounds: Normal heart sounds. No murmur heard. Pulmonary:     Effort: Pulmonary effort is normal.     Breath sounds: Normal breath sounds. No wheezing or rales.  Musculoskeletal:     Right lower leg: No edema.     Left lower leg: No edema.      Assessment & Plan .     89 y/o male with hypertension, hyperlipidemia, CAD, multiple prior PCIS, admitted with chest pain   Chest pain: Negative troponin Moderate nonobstructive CAD with no new severe stenosis Suspect hypertension contributed to chest pain Continue current medical management. He is on aspirin and Plavix at baseline.  With recent chest pain admission, could continue DAPT for 1 more month.  After that, consider stopping Plavix and continue aspirin 81 mg daily. Continue statin.   Recommend  Coreg 6.25 mg twice daily. Continue amlodipine 10 mg daily, Imdur 30 mg daily. Okay to continue pravastatin, Zetia, and Vascepa. Consider outpatient evaluation for possible sleep apnea given his snoring and exertion In addition, he has prominent right pleural rales in absence of vascular congestion on chest x-ray, and LVEDP being normal. Consider outpatient pulmonary evaluation  Hypertension: Remains uncontrolled.  Hopefully above changes will help.  Patient will be discharged today.  Will arrange outpatient follow-up with Dr. [name] or APP.   For questions or updates, please contact [facility] Please consult [email or web address] for contact info under        Signed, Elder Negus, MD

## 2022-12-10 NOTE — TOC Transition Note (Signed)
Transition of Care [facility]) - CM/SW Discharge Note   Patient Details  Name: LEMONTE AL MRN: [id number] Date of Birth: December 28, 1931  Transition of Care [facility]) CM/SW Contact:  Leone Haven, RN Phone Number: 12/10/2022, 11:04 AM   Clinical Narrative:    For dc, has no needs, wife at bedside to transport home.   Final next level of care: Home/Self Care Barriers to Discharge: No Barriers Identified   Patient Goals and CMS Choice   Choice offered to / list presented to : NA  Discharge Placement                         Discharge Plan and Services Additional resources added to the After Visit Summary for   In-house Referral: NA Discharge Planning Services: CM Consult Post Acute Care Choice: NA          DME Arranged: N/A DME Agency: NA         HH Agency: NA        Social Determinants of Health (SDOH) Interventions SDOH Screenings   Food Insecurity: No Food Insecurity (12/08/2022)  Housing: Low Risk  (12/08/2022)  Transportation Needs: No Transportation Needs (12/10/2022)  Utilities: Not At Risk (12/08/2022)  Depression (PHQ2-9): Low Risk  (01/04/2020)  Financial Resource Strain: Low Risk  (12/05/2022)   Received from [facility]  Tobacco Use: Medium Risk (12/07/2022)     Readmission Risk Interventions     No data to display

## 2022-12-11 LAB — LIPOPROTEIN A (LPA): Lipoprotein (a): 115.5 nmol/L — ABNORMAL HIGH (ref <75.0)

## 2022-12-24 ENCOUNTER — Encounter: Payer: Self-pay | Admitting: Physician Assistant

## 2022-12-24 ENCOUNTER — Ambulatory Visit: Payer: Medicare Other | Attending: Physician Assistant | Admitting: Physician Assistant

## 2022-12-24 VITALS — BP 138/78 | HR 97 | Ht 67.0 in | Wt 188.4 lb

## 2022-12-24 DIAGNOSIS — I10 Essential (primary) hypertension: Secondary | ICD-10-CM | POA: Diagnosis not present

## 2022-12-24 DIAGNOSIS — I2510 Atherosclerotic heart disease of native coronary artery without angina pectoris: Secondary | ICD-10-CM | POA: Diagnosis not present

## 2022-12-24 DIAGNOSIS — E785 Hyperlipidemia, unspecified: Secondary | ICD-10-CM | POA: Diagnosis not present

## 2022-12-24 MED ORDER — CARVEDILOL 6.25 MG PO TABS: 6.2500 mg | ORAL_TABLET | Freq: Two times a day (BID) | ORAL | 3 refills | Status: DC

## 2022-12-24 MED ORDER — CARVEDILOL 6.25 MG PO TABS: 6.2500 mg | ORAL_TABLET | Freq: Two times a day (BID) | ORAL | 1 refills | Status: DC

## 2022-12-24 NOTE — Progress Notes (Unsigned)
Cardiology Office Note:  .   Date:  12/26/2022  ID:  Nicholas Douglas, DOB 15-Nov-1931, MRN [id number] PCP: Danella Penton, MD  [facility] Providers Cardiologist:  Nicki Guadalajara, MD     History of Present Illness: .   Nicholas Douglas is a 89 y.o. male with PMH of CAD, HTN, HLD and tobacco use.  Patient had intervention in 1987, 1991, 52 and 1997.  Cardiac catheterization in 2011 showed preserved LV function with mild residual distal inferior apical contractility, 30 to 40% proximal LAD, 50% mid LAD, 70 to 80% distal LAD lesion, 80 to 90% acute marginal branch, 50% RCA lesion, 40% OM2 lesion and 70% AV groove left circumflex lesion.  Repeat cardiac catheterization in March 2015 demonstrated severe two-vessel CAD was 95% mid LAD lesion treated with high-speed rotational arthrectomy and Promus DES x 2 to proximal and mid LAD.  Medical therapy recommended for distal LAD disease.  He has a history of renal cell carcinoma s/p nephrectomy, he has 1 remaining kidney.  He underwent staged left circumflex PCI on 04/27/2013 with insertion of Promus DES.  Myoview in May 2016 showed normal perfusion, EF 57%.  Cardiac catheterization in April 2017 showed widely patent stent in the LAD and left circumflex vessel, diffusely diseased RCA was 99% stenosis in distal RCA immediately after PDA takeoff.  He underwent successful PTCA.  He underwent another cardiac authorization in July 2018 that showed patent stent in proximal to mid LAD and a patent mid left circumflex vessel, patent PTCA site.  There was no new disease to explain his symptom.  He was taken off of ARB therapy due to hyperkalemia.  Cardiac catheterization in November 2020 showed widely patent stent in the LAD and left circumflex vessel, no significant disease otherwise, medical therapy recommended.  He has been able to tolerate Livalo him addition of Vascepa.  He was seen at [facility] ER in July 2020 for due to shortness of breath and was treated with IV Lasix.   Echocardiogram at the time showed EF greater than 55%, mildly enlarged left atrium, mild trivial MR and no TR.  He was recently seen by Dr. [name] in July 2024 at which time he was doing well.  Patient was most recently admitted to the hospital in early November 2024.  Cardiac catheterization revealed 90% D1 lesion, 60% RPDA lesion, 60% OM2 lesion, 90% D2 lesion, patent LAD and the left circumflex stent.  Medical therapy was recommended.  It was suspected his chest pain was brought on by hypertension.  Patient presents today accompanied by his wife.  His atenolol has been discontinued due to during the recent hospitalization and it has been placed on carvedilol 6.25 mg twice a day instead.  He has blood pressure is borderline elevated in the 130s.  He denies any further chest discomfort.  He has no lower extremity edema, orthopnea or PND.  On physical exam, he has prominent to right basilar crackles.  Previous CT image did not mention any evidence of pulmonary fibrosis.  This could be atelectasis or pulmonary fibrosis, he does not appear to be volume overloaded to suggest pulmonary edema.  I recommended continue on the current current therapy.  ROS:   He denies chest pain, palpitations, dyspnea, pnd, orthopnea, n, v, dizziness, syncope, edema, weight gain, or early satiety. All other systems reviewed and are otherwise negative except as noted above.    Studies Reviewed: .        Cardiac Studies & Procedures  CARDIAC CATHETERIZATION  CARDIAC CATHETERIZATION 12/09/2022  Narrative   Mid LM to Mid LAD lesion is 10% stenosed.   Mid LAD to Dist LAD lesion is 20% stenosed.   Prox Cx to Mid Cx lesion is 5% stenosed.   Ost Cx to Prox Cx lesion is 20% stenosed.   Ost RCA to Mid RCA lesion is 20% stenosed.   Dist RCA lesion is 30% stenosed.   2nd Diag lesion is 90% stenosed.   1st Mrg lesion is 40% stenosed.   2nd Mrg lesion is 60% stenosed.   Acute Mrg lesion is 90% stenosed.   RPAV lesion is 20%  stenosed.   RPDA lesion is 60% stenosed.   1st Diag lesion is 90% stenosed.  1.  Relatively unchanged burden of disease with patent LAD and left circumflex stents with mild to moderate diffuse disease elsewhere.  There is some progression of disease in the ostium of the first and second diagonal and RV marginal branches but these are small vessels and should be treated medically. 2.  LVEDP of 14 to 16 mmHg.  Summary: The images were reviewed with Dr. Rosemary Holms and medical therapy will be pursued.  Findings Coronary Findings Diagnostic  Dominance: Right  Left Main Mid LM to Mid LAD lesion is 10% stenosed. The lesion was previously treated .  Left Anterior Descending Mid LAD to Dist LAD lesion is 20% stenosed.  First Diagonal Branch Vessel is small in size. 1st Diag lesion is 90% stenosed.  Second Diagonal Branch Vessel is small in size. 2nd Diag lesion is 90% stenosed.  Left Circumflex Ost Cx to Prox Cx lesion is 20% stenosed. Prox Cx to Mid Cx lesion is 5% stenosed. The lesion was previously treated .  First Obtuse Marginal Branch 1st Mrg lesion is 40% stenosed.  Second Obtuse Marginal Branch 2nd Mrg lesion is 60% stenosed.  Right Coronary Artery There is mild diffuse disease throughout the vessel. Ost RCA to Mid RCA lesion is 20% stenosed. Dist RCA lesion is 30% stenosed.  Acute Marginal Branch Vessel is small in size. Acute Mrg lesion is 90% stenosed.  Right Posterior Descending Artery Vessel is small in size. RPDA lesion is 60% stenosed.  Right Posterior Atrioventricular Artery RPAV lesion is 20% stenosed.  Intervention  No interventions have been documented.   CARDIAC CATHETERIZATION  CARDIAC CATHETERIZATION 01/04/2019  Narrative  Mid LM to Mid LAD lesion is 10% stenosed.  2nd Diag lesion is 40% stenosed.  Mid LAD to Dist LAD lesion is 20% stenosed.  Prox Cx to Mid Cx lesion is 5% stenosed.  Ost Cx to Prox Cx lesion is 20% stenosed.  1st  Mrg lesion is 40% stenosed.  2nd Mrg lesion is 30% stenosed.  Ost RCA to Mid RCA lesion is 20% stenosed.  Acute Mrg lesion is 70% stenosed.  Dist RCA lesion is 30% stenosed.  RPAV lesion is 20% stenosed.  The left ventricular systolic function is normal.  LV end diastolic pressure is normal.  RPDA lesion is 40% stenosed.  Widely patent stents in the LAD and circumflex vessel.  The LAD has 40% stenosis in a small diagonal branch arising just distal to the stent in the mid LAD.  There is mild 20% mid LAD narrowing; complex vessel has smooth 20% narrowing proximal to the stented segment.  Small marginal branches arising within the circumflex stent had ostial narrowing of 40 and 30%; the  right coronary artery has moderate diffuse calcification with irregularity with narrowing of 20 to 30%  in the mid vessel, 30% prior to PDA, and with no significant restenosis at the site of distal PTCA.  A small acute marginal branch has 70 to 80% ostial narrowing in a small caliber vessel.  LVEDP 17 - 23 MM hG.  RECOMMENDATION: The catheterization study is not significantly changed from the patient's last catheterization in July 2018.  Atherectomy sites with stenting in the LAD and circumflex vessel remain widely patent.  There is mild small branch ostial narrowings.  The RCA is calcified with patent PTCA site.  Medical therapy will be continued.  Continue long-term DAPT as the patient has been on aspirin/Plavix.  Continue aggressive lipid-lowering therapy with target LDL less than 70.  Exercise prescription.  Findings Coronary Findings Diagnostic  Dominance: Right  Left Main Mid LM to Mid LAD lesion is 10% stenosed. The lesion was previously treated.  Left Anterior Descending Mid LAD to Dist LAD lesion is 20% stenosed.  Second Diagonal Branch 2nd Diag lesion is 40% stenosed.  Left Circumflex Ost Cx to Prox Cx lesion is 20% stenosed. Prox Cx to Mid Cx lesion is 5% stenosed. The lesion was  previously treated.  First Obtuse Marginal Branch 1st Mrg lesion is 40% stenosed.  Second Obtuse Marginal Branch 2nd Mrg lesion is 30% stenosed.  Right Coronary Artery There is mild diffuse disease throughout the vessel. Ost RCA to Mid RCA lesion is 20% stenosed. Dist RCA lesion is 30% stenosed.  Acute Marginal Branch Vessel is small in size. Acute Mrg lesion is 70% stenosed.  Right Posterior Descending Artery Vessel is small in size. RPDA lesion is 40% stenosed.  Right Posterior Atrioventricular Artery RPAV lesion is 20% stenosed.  Intervention  No interventions have been documented.   STRESS TESTS  MYOCARDIAL PERFUSION IMAGING 05/26/2015  Narrative  The left ventricular ejection fraction is normal (55-65%).  Nuclear stress EF: 59%.  There was no ST segment deviation noted during stress.  The study is normal.  1. Normal EF and wall motion. 2. No evidence for ischemia or infarction.   ECHOCARDIOGRAM  ECHOCARDIOGRAM COMPLETE 12/08/2022  Narrative ECHOCARDIOGRAM REPORT    [name] Date of Exam: 12/08/2022 Medical Rec #:  [id number]    Height:       67.0 in Accession #:    [id number]   Weight:       186.2 lb Date of Birth:  1931/03/26    BSA:          1.962 m Patient Age:    89 years     BP:           160/73 mmHg Patient Gender: M            HR:           58 bpm. Exam Location:  Inpatient  Procedure: 2D Echo, Cardiac Doppler, Color Doppler and Intracardiac Opacification Agent  Indications:    Chest Pain R07.9  History:        Patient has prior history of Echocardiogram examinations, most recent 07/18/2021. CAD and Angina, Signs/Symptoms:Chest Pain and Dyspnea; Risk Factors:Hypertension, Dyslipidemia, Sleep Apnea and Current Smoker. CKD, stage 3.  Sonographer:    Lucendia Herrlich RCS Referring Phys: [id number] CALLIE E GOODRICH  IMPRESSIONS   1. Images are limited. 2. Left ventricular ejection fraction, by estimation, is 55 to  60%. The left ventricle has normal function. The left ventricle has no regional wall motion abnormalities. There is mild concentric left ventricular hypertrophy. Left ventricular diastolic parameters are  consistent with Grade I diastolic dysfunction (impaired relaxation). 3. Right ventricular systolic function is normal. The right ventricular size is normal. There is normal pulmonary artery systolic pressure. The estimated right ventricular systolic pressure is 14.7 mmHg. 4. The mitral valve is degenerative. Trivial mitral valve regurgitation. 5. The inferior vena cava is dilated in size with >50% respiratory variability, suggesting right atrial pressure of 8 mmHg. 6. The aortic valve was not well visualized. There is mild calcification of the aortic valve. Aortic valve regurgitation is not visualized.  Comparison(s): Prior images reviewed side by side. LVEF normal range at 55-60%.  FINDINGS Left Ventricle: Left ventricular ejection fraction, by estimation, is 55 to 60%. The left ventricle has normal function. The left ventricle has no regional wall motion abnormalities. Definity contrast agent was given IV to delineate the left ventricular endocardial borders. The left ventricular internal cavity size was normal in size. There is mild concentric left ventricular hypertrophy. Left ventricular diastolic parameters are consistent with Grade I diastolic dysfunction (impaired relaxation).  Right Ventricle: The right ventricular size is normal. Right vetricular wall thickness was not well visualized. Right ventricular systolic function is normal. There is normal pulmonary artery systolic pressure. The tricuspid regurgitant velocity is 1.29 m/s, and with an assumed right atrial pressure of 8 mmHg, the estimated right ventricular systolic pressure is 14.7 mmHg.  Left Atrium: Left atrial size was normal in size.  Right Atrium: Right atrial size was normal in size.  Pericardium: There is no evidence of  pericardial effusion.  Mitral Valve: The mitral valve is degenerative in appearance. Mild mitral annular calcification. Trivial mitral valve regurgitation.  Tricuspid Valve: The tricuspid valve is not well visualized. Tricuspid valve regurgitation is trivial.  Aortic Valve: The aortic valve was not well visualized. There is mild calcification of the aortic valve. There is moderate aortic valve annular calcification. Aortic valve regurgitation is not visualized. Aortic valve peak gradient measures 9.2 mmHg.  Pulmonic Valve: The pulmonic valve was not well visualized. Pulmonic valve regurgitation is trivial.  Aorta: The aortic root and ascending aorta are structurally normal, with no evidence of dilitation.  Venous: The inferior vena cava is dilated in size with greater than 50% respiratory variability, suggesting right atrial pressure of 8 mmHg.  IAS/Shunts: The interatrial septum was not well visualized.   LEFT VENTRICLE PLAX 2D LVOT diam:     2.00 cm   Diastology LV SV:         60        LV e' medial:    7.07 cm/s LV SV Index:   31        LV E/e' medial:  7.5 LVOT Area:     3.14 cm  LV e' lateral:   7.29 cm/s LV E/e' lateral: 7.3   RIGHT VENTRICLE            IVC RV S prime:     8.81 cm/s  IVC diam: 2.30 cm TAPSE (M-mode): 0.9 cm  LEFT ATRIUM             Index        RIGHT ATRIUM          Index LA Vol (A2C):   46.7 ml 23.81 ml/m  RA Area:     9.25 cm LA Vol (A4C):   44.1 ml 22.45 ml/m  RA Volume:   15.20 ml 7.75 ml/m LA Biplane Vol: 47.0 ml 23.96 ml/m AORTIC VALVE AV Area (Vmax): 1.93 cm AV Vmax:  152.00 cm/s AV Peak Grad:   9.2 mmHg LVOT Vmax:      93.37 cm/s LVOT Vmean:     59.267 cm/s LVOT VTI:       0.192 m  AORTA Ao Root diam: 3.00 cm Ao Asc diam:  3.00 cm  MITRAL VALVE               TRICUSPID VALVE MV Area (PHT): 2.37 cm    TR Peak grad:   6.7 mmHg MV Decel Time: 320 msec    TR Vmax:        129.00 cm/s MR Peak grad: 19.4 mmHg MR Vmax:      220.00  cm/s  SHUNTS MV E velocity: 53.30 cm/s  Systemic VTI:  0.19 m MV A velocity: 93.20 cm/s  Systemic Diam: 2.00 cm MV E/A ratio:  0.57  Nona Dell MD Electronically signed by Nona Dell MD Signature Date/Time: 12/08/2022/3:56:23 PM    Final             Risk Assessment/Calculations:            Physical Exam:   VS:  BP 138/78 (BP Location: Left Arm, Patient Position: Sitting, Cuff Size: Normal)   Pulse 97   Ht 5\' 7"  (1.702 m)   Wt 188 lb 6.4 oz (85.5 kg)   SpO2 94%   BMI 29.51 kg/m    Wt Readings from Last 3 Encounters:  12/24/22 188 lb 6.4 oz (85.5 kg)  12/10/22 185 lb 3 oz (84 kg)  12/07/22 188 lb (85.3 kg)    GEN: Well nourished, well developed in no acute distress NECK: No JVD; No carotid bruits CARDIAC: RRR, no murmurs, rubs, gallops RESPIRATORY:  Clear to auscultation without rales, wheezing or rhonchi  ABDOMEN: Soft, non-tender, non-distended EXTREMITIES:  No edema; No deformity   ASSESSMENT AND PLAN: .    CAD -Recent cardiac catheterization showed stable coronary anatomy.  He denies any further chest discomfort.  Continue aspirin and Plavix  Hypertension: Previous atenolol has been discontinued and is switched to carvedilol.   Hyperlipidemia: On Zetia, Vescepa and atorvastatin       Dispo: Follow-up with Dr. [name] as previously arranged  Signed, Azalee Course, PA

## 2022-12-24 NOTE — Patient Instructions (Signed)
Medication Instructions:  NO CHANGES *If you need a refill on your cardiac medications before your next appointment, please call your pharmacy*   Lab Work: NO LABS If you have labs (blood work) drawn today and your tests are completely normal, you will receive your results only by: MyChart Message (if you have MyChart) OR A paper copy in the mail If you have any lab test that is abnormal or we need to change your treatment, we will call you to review the results.   Testing/Procedures: NO TESTING   Follow-Up: At [facility], you and your health needs are our priority.  As part of our continuing mission to provide you with exceptional heart care, we have created designated Provider Care Teams.  These Care Teams include your primary Cardiologist (physician) and Advanced Practice Providers (APPs -  Physician Assistants and Nurse Practitioners) who all work together to provide you with the care you need, when you need it.    Your next appointment:   KEEP UPCOMING APPOINTMENT March 17 2023  Provider:   Nicki Guadalajara, MD

## 2023-01-15 ENCOUNTER — Other Ambulatory Visit: Payer: Self-pay | Admitting: Cardiovascular Disease

## 2023-01-15 ENCOUNTER — Telehealth: Payer: Self-pay | Admitting: Cardiovascular Disease

## 2023-01-15 NOTE — Telephone Encounter (Signed)
   Pre-operative Risk Assessment    Patient Name: Nicholas Douglas  DOB: 09-Apr-1931 MRN: [id number]     Request for Surgical Clearance    Procedure:  Dental Extraction - Amount of Teeth to be Pulled:  1  Date of Surgery:  Clearance TBD                                 Surgeon:  Dr. Ples Specter Surgeon's Group or Practice Name:  [facility] Phone number:  [phone] Fax number:  [phone]   Type of Clearance Requested:   - Medical  - Pharmacy:  Hold Aspirin and Clopidogrel (Plavix) TBD by cardiologist   Type of Anesthesia:  None    Additional requests/questions:  Please fax a copy of medical clearance to the surgeon's office.  [name]   01/15/2023, 4:15 PM

## 2023-01-15 NOTE — Telephone Encounter (Signed)
   Patient Name: Nicholas Douglas  DOB: 05-Jun-1931 MRN: [id number]  Primary Cardiologist: Nicki Guadalajara, MD  Chart reviewed as part of pre-operative protocol coverage.   Simple dental extractions (i.e. 1-2 teeth) are considered low risk procedures per guidelines and generally do not require any specific cardiac clearance. It is also generally accepted that for simple extractions and dental cleanings, there is no need to interrupt blood thinner therapy.  SBE prophylaxis is not required for the patient from a cardiac standpoint.  I will route this recommendation to the requesting party via Epic fax function and remove from pre-op pool.  Please call with questions.  Napoleon Form, Leodis Rains, NP 01/15/2023, 4:23 PM

## 2023-02-24 ENCOUNTER — Other Ambulatory Visit: Payer: Self-pay | Admitting: Cardiovascular Disease

## 2023-03-17 ENCOUNTER — Encounter: Payer: Self-pay | Admitting: Cardiovascular Disease

## 2023-03-17 ENCOUNTER — Ambulatory Visit: Payer: Medicare Other | Attending: Cardiovascular Disease | Admitting: Cardiovascular Disease

## 2023-03-17 VITALS — BP 132/80 | HR 80 | Ht 67.0 in | Wt 190.2 lb

## 2023-03-17 DIAGNOSIS — I25119 Atherosclerotic heart disease of native coronary artery with unspecified angina pectoris: Secondary | ICD-10-CM | POA: Diagnosis not present

## 2023-03-17 DIAGNOSIS — Z9861 Coronary angioplasty status: Secondary | ICD-10-CM

## 2023-03-17 DIAGNOSIS — I10 Essential (primary) hypertension: Secondary | ICD-10-CM

## 2023-03-17 DIAGNOSIS — E785 Hyperlipidemia, unspecified: Secondary | ICD-10-CM

## 2023-03-17 DIAGNOSIS — C320 Malignant neoplasm of glottis: Secondary | ICD-10-CM

## 2023-03-17 DIAGNOSIS — I2510 Atherosclerotic heart disease of native coronary artery without angina pectoris: Secondary | ICD-10-CM

## 2023-03-17 DIAGNOSIS — E782 Mixed hyperlipidemia: Secondary | ICD-10-CM | POA: Diagnosis not present

## 2023-03-17 NOTE — Progress Notes (Signed)
 Patient ID: Nicholas Douglas, male   DOB: [birth year], 89 y.o.   MRN: [id number]      HPI: Nicholas Douglas is an 89 y.o. Caucasian male who presents to the office today for a 7 month cardiology evaluation.  [name] has known CAD and underwent numerous interventions dating back to 82, 1991, 1993, and in 1997. Cardiac catheterization in 2011 which showed preserved LV function with mild residual distal inferior apical hypocontractility. There is evidence for coronary calcification with segmental narrowing of his LAD of 30-40% proximally, 50% diffusely, in the midsegment, and 70-80% in the distal region, he had AV groove circumflex stenoses of 70 and 50% with a 40% OM 2 stenosis, and a 30-40 and 50% RCA stenoses with 80-90% stenosis in the acute marginal branch. He had been on medical therapy.  He presented to [facility] in March 2015 with class IV angina and catheterization demonstrated severe 2 vessel CAD with significant current calcification of the LAD with up to 95% mid LAD stenosis and diffuse proximal stenosis. He underwent successful high-speed rotational atherectomy the following day by Dr. Swaziland and a 1.5 mm bur was used. Mid LAD was stented with a 2.75x38 mm Promus stent in the proximal LAD was stented with a 3.0x28 mm Promus stent. Medical therapy was recommended for his distal LAD stenosis. He has only one kidney and staged intervention to the left circumflex coronary artery was recommended.  He underwent staged left circumflex PCI on 04/27/2013 by me with successful high-speed rotational atherectomy with a 1.5 and 1.75 mm burr, and ultimately had a 2.5x28 mm Promus premier DES stent inserted into the circumflex vessel, which was post dilated to approximately 2.6 mm.  Subsequently he has felt significantly improved with resolution of any chest pain  He has a history of significant hyperlipidemia and has had significant increased number of small LDL particles and insulin resistance. This seemed  to markedly improve with the addition of Niaspan added to zetia and lovaza.  Remotely, he had been on statins consisting of Lipitor and subsequently Crestor. He did have transient LFT elevation. He also concerns of possible risk of developing dementia with statin therapy.  He had  some episodes of Niaspan induced diffuse flushing. He wanted to stop taking his Niaspan. He did have subsequent NMR off Niaspan and done just on Zetia and as well as 2 g of Lovaza.  This showed increased abnormalities such that his total cholesterol was 201, LDL had risen to 124, but he now had LDL small particles which have increased from 685 to 1348. Laboratory on 10/26/2012: LDL particle number was elevated at 1657, LDL cholesterol 112 triglycerides 155 and total cholesterol 190. He continued to be insulin resistant with insulin resistance scored 65. When I last saw him, we elected to try Livalo and ultimately titrated this to 4 mg daily to take in addition to his Zetia 10 mg. Laboratory on 02/22/2013: LDL particle number was  markedly improved at 774 with an HDL of 40 calculated LDL 50 small LDL particle #[id number]. Insulin resistance score was also improved at 51.  In July 2015 he developed abdominal discomfort and was found to have acute appendicitis. On CT imaging he was also noted to have prostate enlargement with nodularity and thickening of the bladder base and cystoscopy was suggested for further evaluation. He is status post left nephrectomy. He also was noted to have a 2.3 cm infrarenal suprailiac abdominal aortic aneurysm.  He has been on Livalo 2 mg, Zetia  10 mg for hyperlipidemia.  Lab work on 12/22/2013 showed a cholesterol 155, triglycerides 130, HDL 45, LDL 84.  He has been on amlodipine 10 mg atenolol 12.5 mg twice a day and valsartan 80 mg daily for blood pressure.  He continues to be on aspirin and Plavix for dual antiplatelet therapy.  A nuclear perfusion study on 06/30/2014  revealed normal perfusion and function with  an ejection fraction of 57%.    When he underwent a CT scan for his appendicitis he was told of having a small abdominal aortic aneurysm.  His mother also had an abdominal aortic aneurysm.  The patient recently sprained his right ankle.  As result, he has not been able to be as active as he had in the past and has not been able to play golf which typically he had played up to 4 times per week, often scoring in the 70s.    He was admitted to the hospital in April 2017 with complaints of increasing episodes of chest discomfort.  Troponins were mildly positive at 0.12, consistent with a non-STEMI.  I performed cardiac catheterization on 05/28/2015.  This showed widely patent stents in the LAD and circumflex vessels.  The RCA was diffusely diseased with calcification in the proximal to mid region with distal 99% stenosis and a noncalcified distal RCA segment immediately after the PDA takeoff.  ECI was difficult due to the calcified RCA preventing placement of a stent since the stent was never able to be passed beyond this calcified angled segment despite even attempting guide liner support.  Ultimately, he underwent successful PTCA of the 99% stenosis, which was reduced to 0%.  Since his intervention he had noticed dramatic improvement in his previous symptomatology.  He continues to feel well and is playing golf 3 times per week. He had a basal cell cancer removed from his nose.   When I saw him in April 2018 he was without chest pain or significant shortness of breath and was playing golf at least 3 days per week.  Laboratory showed a total cholesterol 147, triglycerides 151, HDL 43, LDL 74 on regimen of Zetia 10 mg and Livalo 2 mg.  In the past he's been intolerant to Crestor, Lipitor, and Zocor.  I further titrated Livalo 24 mg daily which  he has tolerated. An abdominal ultrasound showed aortoiliac atherosclerosis without aneurysm.   When I saw him in June 2018 he had begun to notice increasing shortness  of breath with walking. He denies any of the chest tightness or pressure that he had experienced prior to his interventions.  At his last catheterization, he had extensive calcification in his RCA and PTCA alone was done since the stent was unable to reach the subtotal distal RCA.  He denies recurrent chest tightness.  He was questioning whether or not he could have had  pneumonia comtributing to his shortness of breath.  At that evaluation, I further titrated his Imdur to 60 mg daily.  His blood pressure was also elevated and I titrated his ARB therapy.  He sagittally saw Dory Peru as an add-on in with complaints of increasing symptomatology definitive cardiac catheterization was recommended.  He underwent repeat cardiac catheterization on 08/05/2016 by Dr. Peter Swaziland.  This continues to show patency of the stents in the proximal to mid LAD and patency of the stented the mid circumflex as well as patent PTCA site of the PL OM vessel.  There was no new disease to explain his symptoms.  He  had normal LV function and normal left ventricular end-diastolic pressure.  Following the catheterization, he has felt well, but he continues to experience some shortness of breath with weakness and decreased energy.  He denies any chest tightness.  Recent laboratory done 10 days ago revealed an elevated potassium at 5.7.  On recheck 3 days later was 5.4.  Subsequent, he has been taken off ARB therapy.    In August 2018 he was complaining of significant fatigue.  His blood pressure was stable and his pulse was 55.  I recommended he reduce his atenolol from 12.5 mg twice a day to just 12.5 mg at bedtime.  I also reduced his isosorbide from 60 mg down to 30 mg in light of his recent catheterization findings.  His prior hypokalemia, resolved with discontinuance of ARB, but more importantly with discontinuance of his excessive exogenous potassium intake with certain foods.   When I saw him in October 2018 he was fairly  stable. Over the winter months he has had difficulty with his back and required injections.  This has limited his exercise.  He admits to gaining weight.  He has noticed that he is more short of breath with walking up hills.  Blood pressures at home often been in the 140-150 range.  He denies any chest pressure.  He is unaware of palpitations.  He had repeat lab work one week ago.  Renal function was stable with a creatinine 1.15.  Potassium was 4.7.  LFTs were normal.  Hemoglobin and hematocrit were stable.  Lipid studies were excellent.   When I saw him in April 2019 his blood pressure recordings at home were in the 130-150 range and he had experience some shortness of breath.  I suggested a trial of HCTZ 12.5 mg every other day.  He was tolerating livalo 4 mg, vascepa and Zetia for hyperlipidemia.  Over the past 6 months, he has been without recurrent anginal symptoms.  He continues to play golf at least 3 days/week and typically score is 75 or below.  He underwent repeat laboratory last week and lipid studies remain excellent with total cholesterol 124, LDL 57, triglycerides 82, HDL 51.  Shortness of breath has improved.  He denies any orthopnea.   When I saw him in October 2019 at which time he was feeling well and his shortness of breath had improved.  His blood pressure was excellent on a regimen consisting of amlodipine 10 mg, atenolol 12.5 mg at bedtime and he had not had any recent swelling.  He was no longer taking HCTZ.  He was playing golf 3 days/week.   He was evaluated in a telemedicine visit on Jun 17, 2018.  At that time he was remaining stable and denied any symptoms of chest pain PND orthopnea.  He is still playing golf at least 3 days/week and his highest score is 82 over the past year.  He does not routinely exercise on the days he does not play golf.  He states his blood pressures typically has been running in the 140s with diastolics in the 60s to 70s.  He denied any palpitations,  episodes of presyncope or syncope.   I saw him as an add-on in the office on December 21, 2018 after he had begun to notice more exertional dyspnea as well as upper back discomfort which in the past may have been his anginal equivalent.  With progressive symptoms repeat echocardiography and definitive cardiac catheterization was recommended.    An echo Doppler  study on December 30, 2018 showed an EF of 55 to 60%.  There was mild aortic sclerosis.  Diastolic dysfunction was indeterminate.  Previously had been noted to have grade 1 diastolic dysfunction on prior echocardiography.  He underwent successful catheterization on January 04, 2019 which showed widely patent stents in the LAD and circumflex vessel.  The LAD had 40% stenosis in a small diagonal branch arising just distal to the stent in the mid LAD.  There was mild 20% mid LAD stenosis.  The circumflex vessel had smooth 20% narrowing proximal to the proximal stent.  There was mild 40 and 30% narrowings and small marginal branches. The RCA had mild CAD of 20 and 30% with calcification and there was 70% stenosis in a small acute marginal branch ostially.  Medical therapy was recommended.  I saw him for initial follow-up of his cardiac catheterization on January 13, 2019 at which time he was doing well on medical therapy.  When I last saw him in April 2016 he remained stable.  He was playing golf 3 times per week and still scoring in the low to mid 70s. He continued to be on amlodipine 10 mg, isosorbide 60 mg a atenolol 12.5 mg twice a day in addition to spironolactone both for blood pressure and his CAD.  He continues to be on DAPT with aspirin/Plavix.  He tolerates Livalo in addition to [employer].    I saw him in October 2021 at which time he continued to remain stable.  He had undergone a CT angio of his chest with contrast by his pulmonologist which showed minimal bibasilar subsegmental atelectasis.  He was noted to have coronary artery calcification as  well as aortic atherosclerosis.  There was no evidence for PE.  He continues to play golf at least 3 days/week and his scores are consistently below 78.  He continues to be on DAPT but denies bleeding.  He is on Zetia 10 mg in addition to Livalo 4 mg and Vascepa 1 capsule twice a day.  Recent lipid studies showed a total cholesterol 152, HDL 52, LDL 78 and triglycerides 122 from November 23, 2019.  Renal function remained stable and he is continue to be on amlodipine 10 mg, isosorbide 60 mg, atenolol 12.5 mg twice a day and spironolactone 12.5 mg daily.  He has not had any anginal symptomatology or change in exercise capacity.    I saw him on Jun 20, 2020 and since his prior evaluation he developed some hoarseness to his voice in December.  He subsequently underwent ENT evaluation by Dr. Geanie Logan and was found to have a friable 4 to 5 mm lesion of the left mid to anterior vocal cord.  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  Today he completed his 33rd and final radiation treatment by Dr. Aggie Cosier.  At his evaluation he denied any chest pain or shortness of breath.  He has been somewhat more fatigued with the radiation.  He recently underwent laboratory on Jun 12, 2020.  LDL cholesterol was improved at 69, total cholesterol 138, triglycerides were minimally increased at 157.  Creatinine was 1.30 with an estimated GFR at 53.  LFTs were normal.  TSH and CBC were normal.    I saw him on January 04, 2021 at which time he continued to do well.  He continued to have a raspy voice with some improvement.  He denies chest pain or shortness of breath.  He did experience some mild throat swelling with radiation.  He does admit to some fatigue.  He continues to be on amlodipine 10 mg, atenolol 12.5 mg twice a day, and isosorbide 60 mg daily.  He was recently told to hold his spironolactone by his primary physician.  He continues to be on Livalo 4 mg, Zetia 10 mg, and Vascepa 1 g twice a day for mixed  hyperlipidemia.  Recent laboratory on December 20, 2020 showed an AST of 41 and ALT of 34.  Hemoglobin hematocrit were stable at 16.1/46.8.  TSH had increased to 6.28 from 3.94 in May 2022.  He completed radiation treatments with Dr. Aggie Cosier.  He was hospitalized on June 13 through June 15 with mild exertional shortness of breath and intermittent chest tightness.  High-sensitivity troponins were negative and he was chest pain free with increase of isosorbide to 60 mg twice a day and initiation of Ranexa 500 mg twice a day.  I saw him on August 31, 2021. He felt well and ws without recurrent chest pain on his increase medical therapy.  He continues to be active and plays golf 3 days/week.  He continues to be on amlodipine 5 mg, atenolol 37.5 mg twice a day, is now on furosemide 20 mg 2 days/week, isosorbide 60 mg twice a day, and Ranexa 500 mg twice a day.  He continues to be on Livalo 4 mg and Zetia 10 mg for hyperlipidemia.  He is on finasteride for his prostate.   I saw him on March 18, 2022.  He has had issues with elevated blood pressure and was evaluated in the emergency room at [facility] on March 14, 2022.  Blood pressure was elevated upon arrival at 176/78.  Since that evaluation, his amlodipine was increased 5 up to 10 mg daily.  He has continued to be on a atenolol 12.5 mg twice a day, isosorbide which he takes 60 mg twice a day and has continued to take Ranexa 500 mg twice daily.  He has been taking furosemide 20 mg 2 times per week.  He has continued to be on DAPT with aspirin/Plavix.  He is on Zetia at 10 mg, needs reapproval of Vascepa which he was taking 1 capsule twice a day and is on Livalo 4 mg daily.    I last saw him on August 27, 2022.  He was evaluated in [place] ER in August 06, 2022.  He had complaints of increasing shortness of breath and fatigue over the last several days.  He had played a round of golf 3 days ago and felt exhausted and dehydrated and tried to drink fluids for  rehydration.  Upon presentation to the ER blood pressure was elevated at 167/86.  O2 saturation was 93 and 94%.  He was given IV Lasix in the ER with excellent urine output of 450 mL.  He underwent an echo Doppler study on August 19, 2022 interpreted by [facility].  LV function was normal with EF at greater than 55%.  He had mildly enlarged left atrium.  He had mild aortic regurgitation without stenosis.  There was trivial MR and no TR.  Presently, Mr. Wiedeman feels improved.  He has seen Dr. Hyacinth Meeker who is his primary care in [place].  He had increased Lasix dose, but now is back to his Lasix 20 mg 2 times per week.  He continues to be on isosorbide 60 mg, ranolazine 500 mg twice a day, atenolol 12.5 mg daily in addition to amlodipine 10 mg.  When I saw him, he  had noticed mild dizziness.  I suggested he change his Lasix to every other day as needed rather than daily and I slightly reduced his amlodipine to 5 mg.  Since I last saw him, he was admitted to [facility] on December 07, 2022 with chest pain.  On December 09, 2022 he underwent cardiac catheterization by Dr. Lynnette Caffey which showed relatively unchanged burden of disease with a patent LAD and left circumflex stents with mild to moderate diffuse disease elsewhere.  There was some progression of disease at the ostium of the first and second diagonal and RV branches but these were small vessels for which medical therapy was recommended.  He was subsequently started on Ranexa and has been breaking a 1000 mg pill in half and takes 500 mg twice a day.  He denies any recurrent chest pain.  He presents for reevaluation.  Past Medical History:  Diagnosis Date   BPH (benign prostatic hyperplasia)    Followed by [name]/urology   CAD S/P PTCA only of RPAV-PL 05/31/2015   99% --> 0%PAV - PTCA only (unable to advance STENT) due to RCA calcification - prox & distal RCA 30%; Patent LAD stent & Cx stent (~20% ISR).    Calculus of kidney    "that's how they  found the cancer" (04/27/2013)   Coronary artery disease involving native coronary artery of native heart with unstable angina pectoris (HCC) 12/03/2011   S/P Cardiac angioplasty 1986, 1991, 1993, 1997.  Last cardiac catheterization 2001.  Cardiolite 05/2011 low risk with normal EF 65%.  Followed by cardiology/Lyrick [facility] every six months.    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   Myocardial infarction [facility])    Other abnormal blood chemistry    Other nonspecific abnormal serum enzyme levels    Personal history of colonic polyps    Pure hypercholesterolemia    Tobacco use disorder    Unspecified congenital cystic kidney disease    Unstable angina (HCC) 04/05/2013; 05/2015    Past Surgical History:  Procedure Laterality Date   APPENDECTOMY  2014   CARDIAC CATHETERIZATION N/A 05/30/2015   Procedure: Left Heart Cath and Coronary Angiography;  Surgeon: Lennette Bihari, MD;  Location: [facility] INVASIVE CV LAB;  Service: Cardiovascular;  Laterality: N/A;   CARDIAC CATHETERIZATION N/A 05/30/2015   Procedure: Coronary Balloon Angioplasty;  Surgeon: Lennette Bihari, MD;  Location: [facility] INVASIVE CV LAB;  Service: Cardiovascular;  Laterality: N/A;   Cardiolite  05/06/2011   low risk study; normal EF.  [facility]   carotid dopplers  03/08/2011   minimal plaque formation B. Symptoms: dizziness.   COLONOSCOPY  10/05/2008   single polyp, [facility].  [name].  Repeat in 3 years.   CORONARY ANGIOPLASTY     "I've had 4" (04/27/2013)   CORONARY ANGIOPLASTY WITH STENT PLACEMENT  04/2013; 04/27/2013   "2 + 1" (04/27/2013)   EYE SURGERY  11/04/2013   Cataract surgery B; [facility].   LEFT HEART CATH AND CORONARY ANGIOGRAPHY N/A 08/05/2016   Procedure: Left  Heart Cath and Coronary Angiography;  Surgeon: Swaziland, Peter M, MD;  Location: [facility] INVASIVE CV LAB;  Service: Cardiovascular;  Laterality:  N/A;   LEFT HEART CATH AND CORONARY ANGIOGRAPHY N/A 01/04/2019   Procedure: LEFT HEART CATH AND CORONARY ANGIOGRAPHY;  Surgeon: Lennette Bihari, MD;  Location: [facility] INVASIVE CV LAB;  Service: Cardiovascular;  Laterality: N/A;   LEFT HEART CATH AND CORONARY ANGIOGRAPHY N/A 12/09/2022   Procedure: LEFT HEART CATH AND CORONARY ANGIOGRAPHY;  Surgeon: [name], MD;  Location: [facility] INVASIVE CV LAB;  Service: Cardiovascular;  Laterality: N/A;   MELANOMA EXCISION Left ~ 2007   "arm"   MELANOMA EXCISION  02/07/2015   R upper back. [facility]   MICROLARYNGOSCOPY N/A 04/06/2020   Procedure: MICROLARYNGOSCOPY WITH BIOPSY OF LARYNX;  Surgeon: Geanie Logan, MD;  Location: [facility] ORS;  Service: ENT;  Laterality: N/A;   NEPHRECTOMY Left 2006   PERCUTANEOUS CORONARY ROTOBLATOR INTERVENTION (PCI-R) N/A 04/06/2013   Procedure: PERCUTANEOUS CORONARY ROTOBLATOR INTERVENTION (PCI-R);  Surgeon: Peter M Swaziland, MD;  Location: [facility] CATH LAB;  Service: Cardiovascular;  Laterality: N/A;   PERCUTANEOUS CORONARY STENT INTERVENTION (PCI-S) N/A 04/27/2013   Procedure: PERCUTANEOUS CORONARY STENT INTERVENTION (PCI-S);  Surgeon: Lennette Bihari, MD;  Location: [facility] CATH LAB;  Service: Cardiovascular;  Laterality: N/A;    Allergies  Allergen Reactions   Angiotensin Receptor Blockers     Other reaction(s): Kidney Disorder Hyperkalemia   Lovastatin Other (See Comments)    Elevated liver enzymes   Morphine Hives   Nifedipine Other (See Comments)    Elevated liver enzymes    Current Outpatient Medications  Medication Sig Dispense Refill   acetaminophen (TYLENOL) 500 MG tablet Take 500 mg by mouth every 6 (six) hours as needed (for pain.).      amLODipine (NORVASC) 10 MG tablet Take 1 tablet (10 mg total) by mouth at bedtime. 30 tablet 1   aspirin 81 MG tablet Take 81 mg by mouth every evening.      carboxymethylcellulose (REFRESH PLUS) 0.5 % SOLN Place 1 drop into both eyes daily as needed.     carvedilol (COREG) 6.25 MG tablet Take 1 tablet (6.25 mg total) by mouth 2 (two) times daily with a meal. 180 tablet 3   clopidogrel (PLAVIX) 75 MG tablet TAKE 1 TABLET BY MOUTH DAILY 90 tablet 3   ezetimibe (ZETIA) 10 MG tablet TAKE 1 TABLET BY MOUTH DAILY 90 tablet 3   finasteride (PROSCAR) 5 MG tablet Take 1 tablet (5 mg total) by mouth daily. (Patient taking differently: Take 2.5 mg by mouth daily.) 90 tablet 3   furosemide (LASIX) 20 MG tablet TAKE 1 TABLET BY MOUTH TWICE  WEEKLY (Patient taking differently: Take 20 mg by mouth daily. Patient takes on Tuesday and Thursday) 26 tablet 3   Homeopathic Products (THERAWORX RELIEF) FOAM Apply 1 Application topically daily as needed (Leg Cramps).     icosapent Ethyl (VASCEPA) 1 g capsule Take 1 capsule (1 g total) by mouth 2 (two) times daily. 180 capsule 3   isosorbide mononitrate (IMDUR) 60 MG 24 hr tablet Take 1 tablet (60 mg total) by mouth 2 (two) times daily. 180 tablet 3   nitroGLYCERIN (NITROSTAT) 0.4 MG SL tablet PLACE 1 TABLET UNDER THE TONGUE EVERY 5 MINUTES AS NEEDED FOR CHEST PAIN. 25 tablet 11   Pitavastatin Calcium (LIVALO) 4 MG TABS TAKE 1 TABLET BY MOUTH IN THE  EVENING 90 tablet 3   ranolazine (RANEXA) 1000 MG SR tablet Take 1 tablet (1,000 mg total) by mouth daily with supper. 30 tablet 1   No current facility-administered medications for this visit.    Socially  he remains active. He is married has 5 children 10 grandchildren 2 great-grandchildren. Is no alcohol use. He typically scores below 75 and plays golf 3 days per week.  He typically scores in the 70s, better than his age.  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   PE BP 132/80   Pulse 80   Ht 5\' 7"  (1.702 m)   Wt 190 lb 3.2 oz (86.3 kg)   SpO2 95%   BMI 29.79 kg/m    Repeat blood pressure by me initially was   Wt Readings from Last 3 Encounters:  03/17/23 190 lb 3.2 oz (86.3 kg)  12/24/22 188 lb 6.4 oz (85.5 kg)  12/10/22 185 lb 3 oz (84 kg)   General: Alert, oriented, no distress.  Skin: normal turgor, no rashes, warm and dry HEENT: Normocephalic, atraumatic. Pupils equal round and reactive to light; sclera anicteric; extraocular muscles intact;  Nose without nasal septal hypertrophy Mouth/Parynx benign; Mallinpatti scale 3 Neck: No JVD, no carotid bruits; normal carotid upstroke Lungs: clear to ausculatation and percussion; no wheezing or rales Chest wall: without tenderness to palpitation Heart: PMI not displaced, RRR, s1 s2 normal, 1/6 systolic murmur, no diastolic murmur, no rubs, gallops, thrills, or heaves Abdomen: soft, nontender; no hepatosplenomehaly, BS+; abdominal aorta nontender and not dilated by palpation. Back: no CVA tenderness Pulses 2+ Musculoskeletal: full range of motion, normal strength, no joint deformities Extremities: no clubbing cyanosis or edema, Homan's sign negative  Neurologic: grossly nonfocal; Cranial nerves grossly wnl Psychologic: Normal  mood and affect    March 18, 2022 ECG (independently read by me): Sinus bradycardia at 55  I personally reviewed the ECG from 03/14/2022 which shows sinus bradycardia at 55  August 31, 2021 ECG (independently read by me):  Sinus rhythm at 63, PVCs  January 04, 2021 ECG (independently read by me): Sinu bradycardia at 58; NSSTT changes, no ectopy   Jun 20, 2020 ECG (independently read by me): Normal sinus rhythm at 62 bpm.  No ectopy.  Normal intervals    October 2021 ECG (independently read by me):  Sinus bradycardia at 56; no ectopy, normal intervals    May 21, 2019 ECG (independently read by me): Sinus bradycardia 54 bpm.  PR interval 188 ms, QTc interval 421 ms.  No ectopy.  January 13, 2019 ECG (independently read by me): Sinus bradycardia 54 bpm.  No ectopy.  PR interval 192 ms, QTc interval 4 3 ms  December 21, 2018 ECG (independently read by me): Normal sinus rhythm at 77 bpm.  Nonspecific ST changes.  Normal intervals  October 20119 ECG (independently read by me): Normal sinus rhythm at 61 bpm.  No ectopy.  Normal intervals.  April 2019 ECG (independently read by me):normal sinus rhythm at 61 bpm.  No ectopy.  No ST segment changes.  October 2018 ECG (independently read by me): Sinus bradycardia 57 bpm.  No ST segment changes.  Normal intervals.  August 2018 ECG (independently read by me): Sinus bradycardia 55 bpm.  Normal intervals.  No significant ST-T changes  June 2018 ECG (independently read by me): Sinus bradycardia 58 bpm.  Normal intervals.  No significant ST-T changes.  April 2018 ECG (independently read by me): Normal sinus rhythm at 63 bpm.  Normal intervals.  No ST segment changes.  September 2017 ECG (independently read by me): Normal sinus rhythm at 60 bpm.  Nonspecific T changes.  Intervals are normal.  May 2017 ECG (independently read by me): Sinus bradycardia 55 bpm.  No ectopy.  Normal intervals.  Nondiagnostic T changes in lead 3.  April 2017 ECG  (independently read by me): Sinus bradycardia 55 bpm.  No ectopy.  No significant ST changes.  Normal intervals.  01/12/2015 ECG (independently read by me): Sinus bradycardia 54 bpm.  No ectopy.  Normal intervals.  June 2016 ECG (independently read by me): Sinus bradycardia 58 bpm.  Normal intervals.  No ectopy. Non-specific T change aVL  November 2015 ECG (independently read by me): Sinus bradycardia 54 bpm.  No significant ST-T changes.  Normal intervals.  May 2015 ECG (independently read by me): Sinus bradycardia 54 beats per minute.  No ectopy.  QTc interval 396 ms.  No significant ST changes.  04/22/2013 ECG (independently read by me): Sinus bradycardia 55 beats per minute. Nonspecific ST changes  03/01/2013 ECG (independently read by me): Sinus bradycardia 52 beats per minute. Normal intervals. No significant ST changes.  Prior ECG of 11/20/2012: Sinus rhythm at 51 beats per minute. QTc interval 400 ms. No significant ST changes.  LABS:     Latest Ref Rng & Units 12/08/2022    1:29 AM 12/07/2022    7:24 PM 12/06/2022    8:49 PM  BMP  Glucose 70 - 99 mg/dL 161  096  045   BUN 8 - 23 mg/dL 18  19  21    Creatinine 0.61 - 1.24 mg/dL 4.09  8.11  9.14   Sodium 135 - 145 mmol/L 138  136  136   Potassium 3.5 - 5.1 mmol/L 4.0  4.2  4.2   Chloride 98 - 111 mmol/L 102  102  102   CO2 22 - 32 mmol/L 28  24  26    Calcium 8.9 - 10.3 mg/dL 9.1  9.4  9.3       Latest Ref Rng & Units 03/14/2022    7:05 AM 07/27/2021    9:12 AM 12/20/2020    7:06 AM  Hepatic Function  Total Protein 6.0 - 8.5 g/dL 7.2  7.6  6.9   Albumin 3.7 - 4.7 g/dL 4.5  4.6  4.3   AST 0 - 40 IU/L 31  38  41   ALT 0 - 44 IU/L 28  36  34   Alk Phosphatase 44 - 121 IU/L 62  62  65   Total Bilirubin 0.0 - 1.2 mg/dL 1.0  0.7  0.8       Latest Ref Rng & Units 12/08/2022    1:29 AM 12/07/2022    7:24 PM 12/06/2022    8:49 PM  CBC  WBC 4.0 - 10.5 K/uL 8.6  7.2  9.2   Hemoglobin 13.0 - 17.0 g/dL 78.2  95.6  21.3    Hematocrit 39.0 - 52.0 % 47.0  48.2  47.3   Platelets 150 - 400 K/uL 236  264  257    Lab Results  Component Value Date   MCV 89.5 12/08/2022   MCV 89.3 12/07/2022   MCV 89.6 12/06/2022   Lab Results  Component Value Date   TSH 6.284 (H) 08/06/2022  .  Lipid Panel     Component Value Date/Time   CHOL 147 03/14/2022 0705   TRIG 108 03/14/2022 0705   TRIG 131 02/22/2013 0804   HDL 54 03/14/2022 0705   HDL 40 02/22/2013 0804   CHOLHDL 2.7 03/14/2022 0705   CHOLHDL 3.5 05/09/2015 1400   VLDL 31 (H) 05/09/2015 1400   LDLCALC 73 03/14/2022 0705   LDLCALC 50 02/22/2013 0804   RADIOLOGY: Dg Chest 2 View  08/17/2012   *RADIOLOGY REPORT*  Clinical Data: Renal cell carcinoma  CHEST - 2 VIEW  Comparison: 05/08/11  Findings: The cardiomediastinal silhouette is stable.  No acute infiltrate or pleural effusion.  No pulmonary edema.  Bony thorax is unremarkable.  IMPRESSION: No active disease.  No significant change.   Original Report Authenticated By: Natasha Mead, M.D.    CV STUDIES:  Echo 12/30/2018 Left ventricular ejection fraction, by visual estimation, is 55 to 60%. The left ventricle has normal function. There is no left ventricular hypertrophy. 2. Left ventricular diastolic parameters are indeterminate. 3. The left ventricle has no regional wall motion abnormalities. 4. Global right ventricle has normal systolic function.The right ventricular size is normal. No increase in right ventricular wall thickness. 5. Left atrial size was mildly dilated. 6. Right atrial size was normal. 7. The mitral valve is normal in structure. No evidence of mitral valve regurgitation. No evidence of mitral stenosis. 8. The tricuspid valve is normal in structure. Tricuspid valve regurgitation is not demonstrated. 9. The aortic valve is normal in structure. Aortic valve regurgitation is trivial. Mild aortic valve sclerosis without stenosis. 10. The pulmonic valve was grossly normal. Pulmonic valve  regurgitation is not visualized. 11. The inferior vena cava is normal in size with greater than 50% respiratory variability, suggesting right atrial pressure of 3 mmHg.   CARDIAC CATHETERIZATION: 01/04/2019  Mid LM to Mid LAD lesion is 10% stenosed. 2nd Diag lesion is 40% stenosed. Mid LAD to Dist LAD lesion is 20% stenosed. Prox Cx to Mid Cx lesion is 5% stenosed. Ost Cx to Prox Cx lesion is 20% stenosed. 1st Mrg lesion is 40% stenosed. 2nd Mrg lesion is 30% stenosed. Ost RCA to Mid RCA lesion is 20% stenosed. Acute Mrg lesion is 70% stenosed. Dist RCA lesion is 30% stenosed. RPAV lesion is 20% stenosed. The left ventricular systolic function is normal. LV end diastolic pressure is normal. RPDA lesion is 40% stenosed.   Widely patent stents in the LAD and circumflex vessel.  The LAD has 40% stenosis in a small diagonal branch arising just distal to the stent in the mid LAD.  There is mild 20% mid LAD narrowing; complex vessel has smooth 20% narrowing proximal to the stented segment.  Small marginal branches arising within the circumflex stent had ostial narrowing of 40 and 30%; the  right coronary artery has moderate diffuse calcification with irregularity with narrowing of 20 to 30% in the mid vessel, 30% prior to PDA, and with no significant restenosis at the site of distal PTCA.  A small acute marginal branch has 70 to 80% ostial narrowing in a small caliber vessel.   LVEDP 17 - 23 MM hG.   RECOMMENDATION: The catheterization study is not significantly changed from the patient's last catheterization in July  2018.  Atherectomy sites with stenting in the LAD and circumflex vessel remain widely patent.  There is mild small branch ostial narrowings.  The RCA is calcified with patent PTCA site.  Medical therapy will be continued.  Continue long-term DAPT as the patient has been on aspirin/Plavix.  Continue aggressive lipid-lowering therapy with target LDL less than 70.  Exercise  prescription.      ECHO: 12/08/2022  1. Images are limited.   2. Left ventricular ejection fraction, by estimation, is 55 to 60%. The  left ventricle has normal function. The left ventricle has no regional  wall motion abnormalities. There is mild concentric left ventricular  hypertrophy. Left ventricular diastolic  parameters are consistent with Grade I diastolic dysfunction (impaired  relaxation).   3. Right ventricular systolic function is normal. The right ventricular  size is normal. There is normal pulmonary artery systolic pressure. The  estimated right ventricular systolic pressure is 14.7 mmHg.   4. The mitral valve is degenerative. Trivial mitral valve regurgitation.   5. The inferior vena cava is dilated in size with >50% respiratory  variability, suggesting right atrial pressure of 8 mmHg.   6. The aortic valve was not well visualized. There is mild calcification  of the aortic valve. Aortic valve regurgitation is not visualized.   Comparison(s): Prior images reviewed side by side. LVEF normal range at  55-60%.    CATH: 12/09/2022    Mid LM to Mid LAD lesion is 10% stenosed.   Mid LAD to Dist LAD lesion is 20% stenosed.   Prox Cx to Mid Cx lesion is 5% stenosed.   Ost Cx to Prox Cx lesion is 20% stenosed.   Ost RCA to Mid RCA lesion is 20% stenosed.   Dist RCA lesion is 30% stenosed.   2nd Diag lesion is 90% stenosed.   1st Mrg lesion is 40% stenosed.   2nd Mrg lesion is 60% stenosed.   Acute Mrg lesion is 90% stenosed.   RPAV lesion is 20% stenosed.   RPDA lesion is 60% stenosed.   1st Diag lesion is 90% stenosed.   1.  Relatively unchanged burden of disease with patent LAD and left circumflex stents with mild to moderate diffuse disease elsewhere.  There is some progression of disease in the ostium of the first and second diagonal and RV marginal branches but these are small vessels and should be treated medically. 2.  LVEDP of 14 to 16 mmHg.   Summary: The  images were reviewed with Dr. Rosemary Holms and medical therapy will be pursued.      IMPRESSION:  1. Coronary artery disease involving native coronary artery of native heart with angina pectoris (HCC)   2. History of percutaneous coronary interventions   3. Essential hypertension   4. Mixed hyperlipidemia   5. 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 Hilario Robarts is a young appearing active 89 year old gentleman who has CAD dating back to 5 and had undergone multiple interventions over a10 year period from 65 to 1997. He developed unstable angina symptomatology in March 2015 and catheterization done initially at [facility] showed multivessel disease.  He underwent successful staged rotational coronary atherectomy initially involving the RCA in early March 2015, and on 04/27/2013 repeat intervention was done to the left circumflex system with rotational atherectomy.  At that time, he had a widely patent stent of his RCA, as well as a  widely patent stent in his LAD.  He also had 60-70% mid LAD stenosis, which had not progressed. In 2017 he noticed a change in symptomatology with the development of more shortness of breath, and no energy and felt occasional episodes of vague chest discomfort with possible left arm radiation.  He was hospitalized in late April 2017 and was found to have progressive 99% distal RCA stenosis after the PDA takeoff.  His stents in the LAD and circumflex were widely patent.  There was mild concomitant CAD in the circumflex.  His RCA was diffusely diseased and calcified with narrowings of 30% of the mid segment, but there was an angulated area of narrowing calcified narrowing of 40% before the acute margin, which prevented a stent from getting beyond this.  He underwent successful PTCA  to the distal stenosis.  At catheterization in July 2018 his intervention sites were patent and there was no  significant CAD progression. There was mild 30-35% narrowing in the circumflex and mild 40% narrowing in the RCA.  The stents were patent as was the prior PTCA site.  He has had issues with shortness of breath.  ARB therapy had to be discontinued secondary to hyperkalemia, but at the time he was also having a fair amount of exogenous potassium intake.  Because of progressive symptoms of increasing shortness of breath and upper back discomfort which he believed may have been his anginal equivalent definitive catheterization was  performed in November 2020.  The LAD and circumflex stents were patent his distal RCA PTCA site was without restenosis.  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 He was admitted to the hospital on June 13 and discharged on July 19, 2021 with increasing shortness of breath and some vague chest tightness.  An echo Doppler study from July 18, 2021 showed EF 55 to 60% with grade 1 diastolic dysfunction and mild LVH.  There was mild MR and aortic valve sclerosis.  His symptoms improved with diuresis and his isosorbide was titrated to 60 mg twice a day and Ranexa 500 mg was initiated.  Due to recurrent chest pain symptomatology he was hospitalized on December 07, 2022.  Troponins were negative.  Echo Doppler study showed EF 55 to 60% without wall motion abnormalities.  He underwent definitive catheterization by Dr. Lynnette Caffey which is outlined above.  Continued medical therapy was recommended with previously placed patent stents in moderate disease in small diagonal vessels.  Presently, he has been chest pain-free since his hospitalization and is on amlodipine 10 mg, carvedilol 6.25 mg twice a day, isosorbide 60 mg which he has been taking twice a day as well as ranolazine 500 mg twice a day.  In the past he had some intolerance to statins but seems to be able to tolerate Livalo 4 mg, Zetia 10 mg and Vascepa 1  capsule twice a day.Marland Kitchen  LDL cholesterol last year was 78.  He will be seeing Dr. Bethann Punches for primary care I have suggested that he have follow-up laboratory be obtained with target LDL less than 55 if at all possible.  I discussed with him my plans for retirement in June 2025.  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  I will transition him to the care of Dr. Bryan Lemma with plans for follow-up evaluation in 6 to 7 months.    Lennette Bihari, MD, [facility]  03/18/2023 5:27 PM

## 2023-03-17 NOTE — Patient Instructions (Signed)
 Medication Instructions:  No medication changes were made during today's visit.  *If you need a refill on your cardiac medications before your next appointment, please call your pharmacy*   Lab Work: No labs were ordered during today's visit.   If you have labs (blood work) drawn today and your tests are completely normal, you will receive your results only by: MyChart Message (if you have MyChart) OR A paper copy in the mail If you have any lab test that is abnormal or we need to change your treatment, we will call you to review the results.   Testing/Procedures: No labs were ordered during today's visit.    Follow-Up: At [facility], you and your health needs are our priority.  As part of our continuing mission to provide you with exceptional heart care, we have created designated Provider Care Teams.  These Care Teams include your primary Cardiologist (physician) and Advanced Practice Providers (APPs -  Physician Assistants and Nurse Practitioners) who all work together to provide you with the care you need, when you need it.  We recommend signing up for the patient portal called "MyChart".  Sign up information is provided on this After Visit Summary.  MyChart is used to connect with patients for Virtual Visits (Telemedicine).  Patients are able to view lab/test results, encounter notes, upcoming appointments, etc.  Non-urgent messages can be sent to your provider as well.   To learn more about what you can do with MyChart, go to [email or web address].    Your next appointment:   7 month(s)  Provider:   Dr. Randene Bustard     Other Instructions Thank you for choosing [facility]!      A letter will be mailed to you as a reminder to call the office for your follow up appointment.

## 2023-03-18 ENCOUNTER — Encounter: Payer: Self-pay | Admitting: Cardiovascular Disease

## 2023-03-23 ENCOUNTER — Other Ambulatory Visit: Payer: Self-pay | Admitting: Cardiovascular Disease

## 2023-03-31 ENCOUNTER — Other Ambulatory Visit: Payer: Self-pay | Admitting: Cardiovascular Disease

## 2023-04-01 ENCOUNTER — Telehealth: Payer: Self-pay | Admitting: Cardiovascular Disease

## 2023-04-01 NOTE — Telephone Encounter (Signed)
*  STAT* If patient is at the pharmacy, call can be transferred to refill team.   1. Which medications need to be refilled? (please list name of each medication and dose if known) ranolazine (RANEXA) 1000 MG SR tablet    2. Would you like to learn more about the convenience, safety, & potential cost savings by using the [facility]?      3. Are you open to using the [facility] (Type [facility]. ).   4. Which pharmacy/location (including street and city if local pharmacy) is medication to be sent to?[facility]) - [place], [place] - [address] [place]    5. Do they need a 30 day or 90 day supply? 90

## 2023-04-07 MED ORDER — RANOLAZINE ER 1000 MG PO TB12: 1000.0000 mg | ORAL_TABLET | Freq: Every day | ORAL | 3 refills | Status: DC

## 2023-04-07 NOTE — Telephone Encounter (Signed)
 Pt spouse called in back about refill request

## 2023-07-31 ENCOUNTER — Telehealth: Payer: Self-pay | Admitting: Cardiovascular Disease

## 2023-07-31 NOTE — Telephone Encounter (Signed)
 Returned Apolinar (Patient's wife) phone call. Labs showing as active: TSH, Lipid Panel, CBC, and CMET. He will plan on going to a nearby [facility] to have these labs drawn, about one week prior to 09/29/2023 appt with Dr. Anner. Will get in touch with  us  if any refills needed prior to OV. No other concerns.

## 2023-07-31 NOTE — Telephone Encounter (Signed)
 Wife says patient will have labs drawn at [facility]. Please confirm orders have been released in the system.

## 2023-09-09 ENCOUNTER — Other Ambulatory Visit: Payer: Self-pay

## 2023-09-09 MED ORDER — EZETIMIBE 10 MG PO TABS: 10.0000 mg | ORAL_TABLET | Freq: Every day | ORAL | 1 refills | Status: AC

## 2023-09-29 ENCOUNTER — Ambulatory Visit: Attending: Cardiology | Admitting: Cardiology

## 2023-09-29 ENCOUNTER — Encounter: Payer: Self-pay | Admitting: Cardiology

## 2023-09-29 VITALS — BP 138/78 | HR 68 | Ht 67.0 in | Wt 191.3 lb

## 2023-09-29 DIAGNOSIS — E7849 Other hyperlipidemia: Secondary | ICD-10-CM

## 2023-09-29 DIAGNOSIS — I25119 Atherosclerotic heart disease of native coronary artery with unspecified angina pectoris: Secondary | ICD-10-CM | POA: Diagnosis not present

## 2023-09-29 DIAGNOSIS — N1831 Chronic kidney disease, stage 3a: Secondary | ICD-10-CM | POA: Diagnosis not present

## 2023-09-29 DIAGNOSIS — I7143 Infrarenal abdominal aortic aneurysm, without rupture: Secondary | ICD-10-CM

## 2023-09-29 DIAGNOSIS — I10 Essential (primary) hypertension: Secondary | ICD-10-CM | POA: Diagnosis not present

## 2023-09-29 DIAGNOSIS — I2089 Other forms of angina pectoris: Secondary | ICD-10-CM

## 2023-09-29 DIAGNOSIS — I2510 Atherosclerotic heart disease of native coronary artery without angina pectoris: Secondary | ICD-10-CM

## 2023-09-29 DIAGNOSIS — R0609 Other forms of dyspnea: Secondary | ICD-10-CM | POA: Diagnosis not present

## 2023-09-29 DIAGNOSIS — R0989 Other specified symptoms and signs involving the circulatory and respiratory systems: Secondary | ICD-10-CM

## 2023-09-29 MED ORDER — CARVEDILOL 6.25 MG PO TABS: ORAL_TABLET | ORAL | 3 refills | Status: AC

## 2023-09-29 MED ORDER — RANOLAZINE ER 1000 MG PO TB12: 1000.0000 mg | ORAL_TABLET | Freq: Two times a day (BID) | ORAL | 3 refills | Status: AC

## 2023-09-29 NOTE — Progress Notes (Signed)
 Cardiology Office Note:  .   Date:  10/01/2023  ID:  Nicholas Douglas, DOB [birth year], MRN [id number] PCP: Cleotilde Oneil FALCON, MD  [facility] Providers Cardiologist:  Alm Clay, MD     Chief Complaint  Patient presents with   Follow-up    64-month follow-up.  Planets establish new cardiologist.  (Former patient of Dr. Burnard)   Coronary Artery Disease    Extensive stent in the LAD with moderate stent in LCx.  Otherwise moderate disease in the RCA with PTCA distally.    Patient Profile: .     Nicholas Douglas is a relatively healthy appearing 89 y.o. male former remote smoker with a PMH noted below who presents here for 73-month follow-up and establish cardiologist at the request of his previous cardiologist-Dr. Burnard.  CAD (1987): Multiple interventions (1987, 1991, 1993, 1997 Cardiac Cath 2011: Mild residual distal inferoapical hypokinesis with preserved function.  LAD calcification-30 to 40% proximal, 50% to Fuhs mid vessel and 70-80 % distal LAD.  AV G LCx 70% of 80% with 40% OM 2, 30-40 and 50% RCA with 80 to 90% stenosis in the AVM March 2015 (Class IV Angina):   90% mid LAD with diffuse proximal stenosis => Rotablator atherectomy-DES PCI with 2.75 mm x38 mm and 3.0 mm x 20 mm Promus DES Staged Rotablator atherectomy LCx with DES PCI 2.5 mm 20 Promus DES dilated to 0.6 mm. Cardiac Cath April 2017: Widely patent LAD and LCx stents.  RCA diffusely diseased with calcification in the proximal and mid region with distal 99% at bifurcation PTCA only due to inability to pass stent-reduced 9 mm stent to 0%. Cath November 2020: Widely patent LAD and LCx stents (~5-10% ISR), 40% ostial small D1, OM1 and 30% ostial OM 2.  20% proximal LCx.  Diffuse calcified proximal RCA 20%, distal 30% and PDA 40% at the previous PTCA site. => Stable on cath in November 2024. Continues to be on DAPT: ASA 81 mg, Plavix  75 mg; combination amlodipine  10 mg daily, carvedilol  6.25 mg twice daily and Imdur  60 mg twice  daily along with Ranexa  1000 mg daily prior to anginal benefit and BP control. Solitary kidney HLD (LFT elevation and memory loss with atorvastatin  and rosuvastatin)-on Zetia  10 Miller daily and Livalo  4 mg daily along with Vascepa  1 g twice daily HTN Insulin resistance ?  Interstitial lung disease     Nicholas Douglas was last seen on 03/17/2023 by Dr. Burnard: Following hospitalization in November where he underwent cardiac catheterization showing essentially unchanged macrovascular disease with small vessel progression.  He was started on Ranexa  but he was taking one half 1000 g tablet twice daily.  Denied any chest pain or angina since hospitalization.  Tolerating Livalo .  LDL acceptable at 78 although plans were to recheck lipids and plan for LDL less than 55 if possible.   Subjective  Discussed the use of AI scribe software for clinical note transcription with the patient, who gave verbal consent to proceed.  History of Present Illness Nicholas Douglas is a 89 year old male with coronary artery disease who presents for 16-month follow-up. He is accompanied by his wife. He experiences episodes of bradycardia, with his pulse dropping to 41 or 42 bpm, occurring about three times a month. These episodes happen even when he is inactive, and he has managed to increase his heart rate by lifting an eight-pound weight. He also describes feeling 'unplugged' after playing golf, requiring a full day to recover.  No chest pain is reported, and walking does not exacerbate his symptoms.  He experiences persistent shortness of breath, which has not worsened over time. He reports that a previous doctor told him he does not use the bottom portion of his lungs effectively. He was prescribed an inhaler but did not use it due to side effects. He uses breathing apparatuses to aid his lung function.  His current medications include Ranexa  1000 mg once daily, amlodipine  10 mg, carvedilol  6.25 mg, isosorbide  mononitrate 60 mg,  aspirin , Plavix , Zetia  10 mg, Livalo  4 mg, furosemide  twice a week, and Vascepa . No issues with swelling, chest pain, or shortness of breath when lying down. He sleeps about five hours a night and wakes to use the bathroom but does not experience breathing difficulties at night.  His blood pressure at home usually runs around 130/80, but it was higher this morning. He sneezed multiple times, which he attributes to allergies. No dizziness, lightheadedness, or stroke-like symptoms. He experiences bruising easily and attributes it to his age and thin skin, as noted by his dermatologist.  ROS:  Review of Systems - Negative except symptoms noted above    Objective   Medications - Ranexa  (ranolazine ) 1000 mg once a day; - Isosorbide  mononitrate (Imdur ) 60 mg twice daily - Amlodipine  10 mg; - Carvedilol  6.25 mg twice daily - Aspirin  81 mg daily;- Plavix  75 mg daily - Livalo  4 mg; - Zetia  10 mg; - Vascepa  1 g twice daily - Furosemide  20 mg twice a week   Studies Reviewed: SABRA   EKG Interpretation Date/Time:  Monday September 29 2023 08:45:41 EDT Ventricular Rate:  69 PR Interval:  172 QRS Duration:  88 QT Interval:  410 QTC Calculation: 439 R Axis:   21  Text Interpretation: Sinus rhythm with occasional Premature ventricular complexes Nonspecific ST and T wave abnormality When compared with ECG of 07-Dec-2022 19:11, Premature ventricular complexes are now Present Nonspecific T wave abnormality, worse in Lateral leads Confirmed by Anner Lenis ([id number]) on 09/30/2023 11:36:12 PM    Lab Results  Component 03/14/2022 Date7/29/2025   CHOL 147 160   HDL 54 57   LDLCALC 73 80   TRIG 108 115   HgbA1c  6.0   Labs in Care Everywhere (09/02/2023): Cr 1.2, K 4.5.  Normal LFTs; Hgb 16.2, PLT 243; TSH high at 9.765 Results DIAGNOSTIC Pulmonary function test: Reduced function in lower lung  Echocardiogram: Limited images.  EF estimated 55 to 60%.  Normal wall motion.  Mild concentric LVH.  G1 DD.   Normal RV size function.  Normal RAP.  Trivial MR.  RAP estimated 8 mmHg.  Mild AoV calcification-sclerosis with no stenosis.  Cardiac catheterization: Stable findings from 2020: 10% LAD ISR with 90% ostial D1 90% ostial D2, 20% mid LAD; 20 % proximal LCx, 40% ostial OM1 and 6% ostial OM2 with 5% ISR in the LCx.  Diffuse 20% calcified proximal RCA, 30% focal calcified RCA and 60% ostial PDA.  (12/2022)  Dominance: Right   Risk Assessment/Calculations:           Physical Exam:   VS:  BP 138/78   Pulse 68   Ht 5' 7 (1.702 m)   Wt 191 lb 4.8 oz (86.8 kg)   SpO2 96%   BMI 29.96 kg/m    Wt Readings from Last 3 Encounters:  09/29/23 191 lb 4.8 oz (86.8 kg)  03/17/23 190 lb 3.2 oz (86.3 kg)  12/24/22 188 lb 6.4 oz (85.5 kg)  GEN: Well nourished, well groomed in no acute distress; Healthy appearing for age. NECK: No JVD; No carotid bruits CARDIAC: Normal S1, S2; RRR,  soft SEM at RUSB but otherwise no murmurs, rubs, gallops RESPIRATORY: Bibasal-right > left crackles with inspiration.  No wet rales, wheezing or rhonchi ; nonlabored, good air movement. ABDOMEN: Soft, non-tender, non-distended EXTREMITIES:  No edema; No deformity      ASSESSMENT AND PLAN: .    Problem List Items Addressed This Visit       Cardiology Problems   Abdominal aortic aneurysm (AAA) (HCC) (Chronic)   Not likely getting an issue based on his advanced age.  We are treating risk factors with blood pressure and lipid control.      Relevant Medications   ranolazine  (RANEXA ) 1000 MG SR tablet   carvedilol  (COREG ) 6.25 MG tablet   Coronary artery disease involving native coronary artery of native heart with angina pectoris (HCC) - Primary (Chronic)   Having episodes where he feels short  unplugged .  Not really truly having anginal type symptoms, but has slowed down more.  Has had issues with bradycardia and bruising  - Increase Ranexa  (ranolazine ) to 1000 mg twice daily, and continue Imdur  60 mg twice  daily  - Continue amlodipine  10 mg daily - Reduce morning dose of carvedilol  to 3.125 mg, and continue p.m. dose at 6.25 mg to allow for more energy and better heart rate responsiveness during the day. - Monitor for bradycardia symptoms; consider heart monitor if symptoms persist. - Okay to stop aspirin  and continue Plavix  (clopidogrel ) Monotherapy  Okay to hold Plavix  5 to 7 days preop for surgical procedures.      Relevant Medications   ranolazine  (RANEXA ) 1000 MG SR tablet   carvedilol  (COREG ) 6.25 MG tablet   Hyperlipidemia (Chronic)   Relevant Medications   ranolazine  (RANEXA ) 1000 MG SR tablet   carvedilol  (COREG ) 6.25 MG tablet   Primary hypertension (Chronic)   Blood pressure generally well-controlled at home, slightly elevated during visit.  Carvedilol  adjustments may affect control => concerned though I may need to potentially add a new medication.  Would probably consider spironolactone  since he has had issues with ARB's in the past. - Continue other pain tenogram daily along with carvedilol  by reducing morning dose to 3.125 mg mg with p.m. dose of 6.25 - Monitor blood pressure regularly. - Adjust medications if blood pressure increases significantly.      Relevant Medications   ranolazine  (RANEXA ) 1000 MG SR tablet   carvedilol  (COREG ) 6.25 MG tablet   Other Relevant Orders   EKG 12-Lead (Completed)     Other   Chronic kidney disease, stage III (moderate) (HCC) (Chronic)   History of renal cell carcinoma with solitary kidney.  Last creatinine was 1.2 with potassium 4.5. Continue to monitor.      Exertional dyspnea (Chronic)   Noting exercise intolerance and exertional fatigue that does not sound like angina.  Concern for just fatigue. - I suspect I reduced the morning dose of carvedilol  down-3.125 mg and continue 6.2 5 in the evening.      Respiratory crackles at both lung bases (Chronic)   Suppose it is not a new finding.  Was told in the past that he breathes  off the top of his lungs.  Likely some underlying lung disease that I cannot find at this point. Berry have crackles he denies any significant dyspnea.  Will defer to PCP and potentially pulmonology.  Follow-Up: Return in about 6 months (around 03/31/2024) for 6 month follow-up with me, [employer].  I spent 64 minutes in the care of Nicholas Douglas today including reviewing outside labs from [facility] and Care Everywhere (2 minutes), reviewing studies (I reviewed several cardiac catheterization //PCI reports from the final PCI images as well as final Images. ~15 minutes), face to face time discussing treatment options (26 minutes), reviewing records from multiple notes from Dr. Burnard but also of recent hospitalizations.  (9 minutes), 12 minutes dictating,updating history, and documenting in the encounter.      Signed, Alm MICAEL Clay, MD, MS Alm Clay, M.D., M.S. Interventional Chartered certified accountant  Pager # [phone]

## 2023-09-29 NOTE — Patient Instructions (Addendum)
 Other Instructions    Contact office if you notice your heart is low 50's- 40's    Medication Instructions:   Stop Aspirin     Increase Ranexa  1000 mg  -- 1 tablet twice a day   Decrease morning dose  to 1/2 tablet 6.25 mg (  equal 3.125 mg) and continue  taking 6.25 mg  in the evening.  *If you need a refill on your cardiac medications before your next appointment, please call your pharmacy*   Lab Work: Not needed    Testing/Procedures:  Not needed  Follow-Up: At [facility], you and your health needs are our priority.  As part of our continuing mission to provide you with exceptional heart care, we have created designated Provider Care Teams.  These Care Teams include your primary Cardiologist (physician) and Advanced Practice Providers (APPs -  Physician Assistants and Nurse Practitioners) who all work together to provide you with the care you need, when you need it.     Your next appointment:   6 month(s)  The format for your next appointment:   In Person  Provider:   Alm Clay, MD   Other Instructions    Contact office if you notice your heart is low 50's- 40's

## 2023-09-30 NOTE — Progress Notes (Incomplete)
 Cardiology Office Note:  .   Date:  09/30/2023  ID:  Nicholas Douglas, DOB [birth year], MRN [id number] PCP: Cleotilde Oneil FALCON, MD  [facility] Providers Cardiologist:  Alm Clay, MD { Click to update primary MD,subspecialty MD or APP then REFRESH:1}    No chief complaint on file.   Patient Profile: .     Nicholas Douglas is a relatively healthy appearing 89 y.o. male former remote smoker with a PMH noted below who presents here for 51-month follow-up and establish cardiologist at the request of his previous cardiologist-Dr. Burnard.  CAD (1987): Multiple interventions (1987, 1991, 1993, 1997 Cardiac Cath 2011: Mild residual distal inferoapical hypokinesis with preserved function.  LAD calcification-30 to 40% proximal, 50% to Fuhs mid vessel and 70-80 % distal LAD.  AV G LCx 70% of 80% with 40% OM 2, 30-40 and 50% RCA with 80 to 90% stenosis in the AVM March 2015 (Class IV Angina):   90% mid LAD with diffuse proximal stenosis => Rotablator atherectomy-DES PCI with 2.75 mm x38 mm and 3.0 mm x 20 mm Promus DES Staged Rotablator atherectomy LCx with DES PCI 2.5 mm 20 Promus DES dilated to 0.6 mm. Cardiac Cath April 2017: Widely patent LAD and LCx stents.  RCA diffusely diseased with calcification in the proximal and mid region with distal 99% at bifurcation PTCA only due to inability to pass stent-reduced 9 mm stent to 0%. Cath November 2020: Widely patent LAD and LCx stents (~5-10% ISR), 40% ostial small D1, OM1 and 30% ostial OM 2.  20% proximal LCx.  Diffuse calcified proximal RCA 20%, distal 30% and PDA 40% at the previous PTCA site. => Stable on cath in November 2024. Continues to be on DAPT: ASA 81 mg, Plavix  75 mg; combination amlodipine  10 mg daily, carvedilol  6.25 mg twice daily and Imdur  60 mg twice daily along with Ranexa  1000 mg daily prior to anginal benefit and BP control. Solitary kidney HLD (LFT elevation and memory loss with atorvastatin  and rosuvastatin)-on Zetia  10 Miller daily  and Livalo  4 mg daily along with Vascepa  1 g twice daily HTN Insulin resistance ?  Interstitial lung disease     Nicholas Douglas was last seen on 03/17/2023 by Dr. Burnard: Following hospitalization in November where he underwent cardiac catheterization showing essentially unchanged macrovascular disease with small vessel progression.  He was started on Ranexa  but he was taking one half 1000 g tablet twice daily.  Denied any chest pain or angina since hospitalization.  Tolerating Livalo .  LDL acceptable at 78 although plans were to recheck lipids and plan for LDL less than 55 if possible.   Subjective  Discussed the use of AI scribe software for clinical note transcription with the patient, who gave verbal consent to proceed.  History of Present Illness Nicholas Douglas is a 89 year old male with coronary artery disease who presents for 51-month follow-up. He is accompanied by his wife. He experiences episodes of bradycardia, with his pulse dropping to 41 or 42 bpm, occurring about three times a month. These episodes happen even when he is inactive, and he has managed to increase his heart rate by lifting an eight-pound weight. He also describes feeling 'unplugged' after playing golf, requiring a full day to recover. No chest pain is reported, and walking does not exacerbate his symptoms.  He experiences persistent shortness of breath, which has not worsened over time. He reports that a previous doctor told him he does not use the bottom  portion of his lungs effectively. He was prescribed an inhaler but did not use it due to side effects. He uses breathing apparatuses to aid his lung function.  His current medications include Ranexa  1000 mg once daily, amlodipine  10 mg, carvedilol  6.25 mg, isosorbide  mononitrate 60 mg, aspirin , Plavix , Zetia  10 mg, Livalo  4 mg, furosemide  twice a week, and Vascepa . No issues with swelling, chest pain, or shortness of breath when lying down. He sleeps about five hours a night  and wakes to use the bathroom but does not experience breathing difficulties at night.  His blood pressure at home usually runs around 130/80, but it was higher this morning. He sneezed multiple times, which he attributes to allergies. No dizziness, lightheadedness, or stroke-like symptoms. He experiences bruising easily and attributes it to his age and thin skin, as noted by his dermatologist.  ROS:  Review of Systems - Negative except symptoms noted above    Objective   Medications - Ranexa  (ranolazine ) 1000 mg once a day; - Isosorbide  mononitrate (Imdur ) 60 mg twice daily - Amlodipine  10 mg; - Carvedilol  6.25 mg twice daily - Aspirin  81 mg daily;- Plavix  75 mg daily - Livalo  4 mg; - Zetia  10 mg; - Vascepa  1 g twice daily - Furosemide  20 mg twice a week   Studies Reviewed: SABRA        Lab Results  Component 03/14/2022 Date7/29/2025   CHOL 147 160   HDL 54 57   LDLCALC 73 80   TRIG 108 115   HgbA1c  6.0   Labs in Care Everywhere (09/02/2023): Cr 1.2, K 4.5.  Normal LFTs; Hgb 16.2, PLT 243; TSH high at 9.765 Results DIAGNOSTIC Pulmonary function test: Reduced function in lower lung  Echocardiogram: Limited images.  EF estimated 55 to 60%.  Normal wall motion.  Mild concentric LVH.  G1 DD.  Normal RV size function.  Normal RAP.  Trivial MR.  RAP estimated 8 mmHg.  Mild AoV calcification-sclerosis with no stenosis.  Cardiac catheterization: Stable findings from 2020: 10% LAD ISR with 90% ostial D1 90% ostial D2, 20% mid LAD; 20 % proximal LCx, 40% ostial OM1 and 6% ostial OM2 with 5% ISR in the LCx.  Diffuse 20% calcified proximal RCA, 30% focal calcified RCA and 60% ostial PDA.  (12/2022)  Dominance: Right   Risk Assessment/Calculations:             Physical Exam:   VS:  BP 138/78   Pulse 68   Ht 5' 7 (1.702 m)   Wt 191 lb 4.8 oz (86.8 kg)   SpO2 96%   BMI 29.96 kg/m    Wt Readings from Last 3 Encounters:  09/29/23 191 lb 4.8 oz (86.8 kg)  03/17/23 190 lb 3.2 oz  (86.3 kg)  12/24/22 188 lb 6.4 oz (85.5 kg)     GEN: Well nourished, well groomed in no acute distress; Healthy appearing for age. NECK: No JVD; No carotid bruits CARDIAC: Normal S1, S2; RRR,  soft SEM at RUSB but otherwise no murmurs, rubs, gallops RESPIRATORY: Bibasal-right > left crackles with inspiration.  No wet rales, wheezing or rhonchi ; nonlabored, good air movement. ABDOMEN: Soft, non-tender, non-distended EXTREMITIES:  No edema; No deformity      ASSESSMENT AND PLAN: .    Problem List Items Addressed This Visit       Cardiology Problems   CAD S/P PTCA only of RPAV-PL   Relevant Medications   ranolazine  (RANEXA ) 1000 MG SR tablet   carvedilol  (  COREG ) 6.25 MG tablet   Coronary artery disease involving native coronary artery of native heart with angina pectoris (HCC) (Chronic)   Relevant Medications   ranolazine  (RANEXA ) 1000 MG SR tablet   carvedilol  (COREG ) 6.25 MG tablet   Hyperlipidemia (Chronic)   Relevant Medications   ranolazine  (RANEXA ) 1000 MG SR tablet   carvedilol  (COREG ) 6.25 MG tablet   Primary hypertension   Relevant Medications   ranolazine  (RANEXA ) 1000 MG SR tablet   carvedilol  (COREG ) 6.25 MG tablet   Stable angina (HCC)   Relevant Medications   ranolazine  (RANEXA ) 1000 MG SR tablet   carvedilol  (COREG ) 6.25 MG tablet     Other   Chronic kidney disease, stage III (moderate) (HCC) (Chronic)   Exertional dyspnea (Chronic)   Other Visit Diagnoses       Essential hypertension, benign    -  Primary   Relevant Medications   ranolazine  (RANEXA ) 1000 MG SR tablet   carvedilol  (COREG ) 6.25 MG tablet   Other Relevant Orders   EKG 12-Lead (Completed)       Assessment and Plan Assessment & Plan Coronary artery disease with heart failure with preserved ejection fraction and bradycardia Coronary artery disease stable post-catheterization. Heart failure with preserved ejection fraction. Bradycardia likely due to carvedilol , causing fatigue and low  energy. - Increase Ranexa  (ranolazine ) to 1000 mg twice daily. - Reduce morning dose of carvedilol  to 6.25 mg. - Monitor blood pressure regularly. - Monitor for bradycardia symptoms; consider heart monitor if symptoms persist.  Chronic dyspnea with crackles on lung exam Chronic dyspnea with crackles likely related to lung and heart conditions. Reduced use of lower lung portions on tests.  Hyperlipidemia Cholesterol levels not at target but well-managed. Current management satisfactory without aggressive treatment.  Hypertension Blood pressure generally well-controlled at home, slightly elevated during visit. Carvedilol  adjustments may affect control. - Monitor blood pressure regularly. - Adjust medications if blood pressure increases significantly.  Chronic kidney disease, unspecified stage Chronic kidney disease, unspecified stage.  Easy bruising Easy bruising likely exacerbated by aspirin  use. - Discontinue aspirin . - Continue Plavix  (clopidogrel ).  Recording duration: 24 minutes       {Are you ordering a CV Procedure (e.g. stress test, cath, DCCV, TEE, etc)?   Press F2        :[id number]}   Follow-Up: Return in about 6 months (around 03/31/2024).  I spent *** minutes in the care of Nicholas Douglas today including {CHL AMB CAR Time Based Billing Options STW (Optional):[phone]::documenting in the encounter.}      Signed, Alm MICAEL Clay, MD, MS Alm Clay, M.D., M.S. Interventional Chartered certified accountant  Pager # [phone]

## 2023-10-01 ENCOUNTER — Encounter: Payer: Self-pay | Admitting: Cardiology

## 2023-10-01 DIAGNOSIS — R0989 Other specified symptoms and signs involving the circulatory and respiratory systems: Secondary | ICD-10-CM | POA: Insufficient documentation

## 2023-10-01 NOTE — Assessment & Plan Note (Signed)
 Suppose it is not a new finding.  Was told in the past that he breathes off the top of his lungs.  Likely some underlying lung disease that I cannot find at this point. Berry have crackles he denies any significant dyspnea.  Will defer to PCP and potentially pulmonology.

## 2023-10-01 NOTE — Assessment & Plan Note (Signed)
 Blood pressure generally well-controlled at home, slightly elevated during visit.  Carvedilol  adjustments may affect control => concerned though I may need to potentially add a new medication.  Would probably consider spironolactone  since he has had issues with ARB's in the past. - Continue other pain tenogram daily along with carvedilol  by reducing morning dose to 3.125 mg mg with p.m. dose of 6.25 - Monitor blood pressure regularly. - Adjust medications if blood pressure increases significantly.

## 2023-10-01 NOTE — Assessment & Plan Note (Signed)
 History of renal cell carcinoma with solitary kidney.  Last creatinine was 1.2 with potassium 4.5. Continue to monitor.

## 2023-10-01 NOTE — Assessment & Plan Note (Signed)
 Having episodes where he feels short  unplugged .  Not really truly having anginal type symptoms, but has slowed down more.  Has had issues with bradycardia and bruising  - Increase Ranexa  (ranolazine ) to 1000 mg twice daily, and continue Imdur  60 mg twice daily  - Continue amlodipine  10 mg daily - Reduce morning dose of carvedilol  to 3.125 mg, and continue p.m. dose at 6.25 mg to allow for more energy and better heart rate responsiveness during the day. - Monitor for bradycardia symptoms; consider heart monitor if symptoms persist. - Okay to stop aspirin  and continue Plavix  (clopidogrel ) Monotherapy  Okay to hold Plavix  5 to 7 days preop for surgical procedures.

## 2023-10-01 NOTE — Assessment & Plan Note (Signed)
 Noting exercise intolerance and exertional fatigue that does not sound like angina.  Concern for just fatigue. - I suspect I reduced the morning dose of carvedilol  down-3.125 mg and continue 6.2 5 in the evening.

## 2023-10-01 NOTE — Assessment & Plan Note (Signed)
 Not likely getting an issue based on his advanced age.  We are treating risk factors with blood pressure and lipid control.

## 2023-10-07 ENCOUNTER — Other Ambulatory Visit: Payer: Self-pay | Admitting: Cardiology

## 2023-10-08 MED ORDER — FUROSEMIDE 20 MG PO TABS
ORAL_TABLET | ORAL | 3 refills | Status: AC
Start: 2023-10-08 — End: ?

## 2023-10-17 ENCOUNTER — Other Ambulatory Visit: Payer: Self-pay

## 2023-10-17 MED ORDER — PITAVASTATIN CALCIUM 4 MG PO TABS: 1.0000 | ORAL_TABLET | Freq: Every evening | ORAL | 3 refills | Status: AC

## 2023-10-17 MED ORDER — CLOPIDOGREL BISULFATE 75 MG PO TABS: 75.0000 mg | ORAL_TABLET | Freq: Every day | ORAL | 3 refills | Status: AC

## 2024-02-02 ENCOUNTER — Other Ambulatory Visit: Payer: Self-pay | Admitting: Cardiology

## 2024-02-04 ENCOUNTER — Other Ambulatory Visit: Payer: Self-pay | Admitting: Cardiology

## 2024-02-04 MED ORDER — ICOSAPENT ETHYL 1 G PO CAPS: 1.0000 g | ORAL_CAPSULE | Freq: Two times a day (BID) | ORAL | 2 refills | Status: AC

## 2024-04-05 ENCOUNTER — Ambulatory Visit: Admitting: Cardiology
